# Patient Record
Sex: Male | Born: 1960
Health system: Southern US, Community
[De-identification: ages and names within clinical notes are randomized; demographics above are authoritative.]

## PROBLEM LIST (undated history)

## (undated) DIAGNOSIS — I480 Paroxysmal atrial fibrillation: Secondary | ICD-10-CM

## (undated) DIAGNOSIS — Z9989 Dependence on other enabling machines and devices: Secondary | ICD-10-CM

## (undated) DIAGNOSIS — H9190 Unspecified hearing loss, unspecified ear: Secondary | ICD-10-CM

## (undated) DIAGNOSIS — I428 Other cardiomyopathies: Secondary | ICD-10-CM

## (undated) DIAGNOSIS — E119 Type 2 diabetes mellitus without complications: Secondary | ICD-10-CM

## (undated) DIAGNOSIS — I447 Left bundle-branch block, unspecified: Secondary | ICD-10-CM

## (undated) DIAGNOSIS — Z860100 Personal history of colon polyps, unspecified: Secondary | ICD-10-CM

## (undated) DIAGNOSIS — H269 Unspecified cataract: Secondary | ICD-10-CM

## (undated) DIAGNOSIS — G4733 Obstructive sleep apnea (adult) (pediatric): Secondary | ICD-10-CM

## (undated) DIAGNOSIS — Z95810 Presence of automatic (implantable) cardiac defibrillator: Secondary | ICD-10-CM

## (undated) DIAGNOSIS — K219 Gastro-esophageal reflux disease without esophagitis: Secondary | ICD-10-CM

## (undated) DIAGNOSIS — N200 Calculus of kidney: Secondary | ICD-10-CM

## (undated) DIAGNOSIS — M19049 Primary osteoarthritis, unspecified hand: Secondary | ICD-10-CM

## (undated) DIAGNOSIS — I10 Essential (primary) hypertension: Secondary | ICD-10-CM

## (undated) DIAGNOSIS — M1990 Unspecified osteoarthritis, unspecified site: Secondary | ICD-10-CM

## (undated) DIAGNOSIS — T7840XA Allergy, unspecified, initial encounter: Secondary | ICD-10-CM

## (undated) DIAGNOSIS — F411 Generalized anxiety disorder: Secondary | ICD-10-CM

## (undated) DIAGNOSIS — I5022 Chronic systolic (congestive) heart failure: Secondary | ICD-10-CM

## (undated) DIAGNOSIS — B354 Tinea corporis: Secondary | ICD-10-CM

## (undated) DIAGNOSIS — K7689 Other specified diseases of liver: Secondary | ICD-10-CM

## (undated) HISTORY — DX: Allergy, unspecified, initial encounter: T78.40XA

## (undated) HISTORY — PX: NECK SURGERY: SHX720

## (undated) HISTORY — DX: Unspecified cataract: H26.9

## (undated) HISTORY — DX: Other specified diseases of liver: K76.89

## (undated) HISTORY — DX: Essential (primary) hypertension: I10

## (undated) HISTORY — DX: Unspecified osteoarthritis, unspecified site: M19.90

## (undated) HISTORY — DX: Gastro-esophageal reflux disease without esophagitis: K21.9

## (undated) HISTORY — PX: BILATERAL KNEE ARTHROSCOPY: SUR91

## (undated) HISTORY — DX: Primary osteoarthritis, unspecified hand: M19.049

## (undated) HISTORY — DX: Generalized anxiety disorder: F41.1

## (undated) HISTORY — DX: Tinea corporis: B35.4

## (undated) HISTORY — DX: Obstructive sleep apnea (adult) (pediatric): G47.33

## (undated) HISTORY — DX: Left bundle-branch block, unspecified: I44.7

## (undated) HISTORY — DX: Calculus of kidney: N20.0

## (undated) HISTORY — PX: EYE SURGERY: SHX253

## (undated) HISTORY — DX: Personal history of colonic polyps: Z86.010

## (undated) HISTORY — PX: APPENDECTOMY: SHX54

## (undated) HISTORY — DX: Other cardiomyopathies: I42.8

## (undated) HISTORY — DX: Personal history of colon polyps, unspecified: Z86.0100

## (undated) HISTORY — DX: Unspecified hearing loss, unspecified ear: H91.90

---
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
## 1998-01-04 ENCOUNTER — Emergency Department (HOSPITAL_COMMUNITY): Admission: EM | Admit: 1998-01-04 | Discharge: 1998-01-04 | Payer: Self-pay | Admitting: Emergency Medicine

## 1998-01-04 ENCOUNTER — Encounter: Payer: Self-pay | Admitting: Emergency Medicine

## 2000-09-03 ENCOUNTER — Encounter: Payer: Self-pay | Admitting: Emergency Medicine

## 2000-09-03 ENCOUNTER — Inpatient Hospital Stay (HOSPITAL_COMMUNITY): Admission: EM | Admit: 2000-09-03 | Discharge: 2000-09-04 | Payer: Self-pay | Admitting: Emergency Medicine

## 2000-09-08 ENCOUNTER — Inpatient Hospital Stay (HOSPITAL_COMMUNITY): Admission: EM | Admit: 2000-09-08 | Discharge: 2000-09-08 | Payer: Self-pay | Admitting: Emergency Medicine

## 2000-09-08 ENCOUNTER — Encounter: Payer: Self-pay | Admitting: Emergency Medicine

## 2001-02-22 ENCOUNTER — Encounter: Payer: Self-pay | Admitting: Orthopedic Surgery

## 2001-02-22 ENCOUNTER — Encounter: Admission: RE | Admit: 2001-02-22 | Discharge: 2001-02-22 | Payer: Self-pay | Admitting: Orthopedic Surgery

## 2001-03-24 ENCOUNTER — Ambulatory Visit (HOSPITAL_COMMUNITY): Admission: RE | Admit: 2001-03-24 | Discharge: 2001-03-24 | Payer: Self-pay | Admitting: Neurosurgery

## 2001-03-24 ENCOUNTER — Encounter: Payer: Self-pay | Admitting: Neurosurgery

## 2001-04-11 ENCOUNTER — Encounter: Payer: Self-pay | Admitting: Neurosurgery

## 2001-04-11 ENCOUNTER — Inpatient Hospital Stay (HOSPITAL_COMMUNITY): Admission: RE | Admit: 2001-04-11 | Discharge: 2001-04-13 | Payer: Self-pay | Admitting: Neurosurgery

## 2002-01-18 ENCOUNTER — Emergency Department (HOSPITAL_COMMUNITY): Admission: EM | Admit: 2002-01-18 | Discharge: 2002-01-18 | Payer: Self-pay | Admitting: Emergency Medicine

## 2002-03-28 ENCOUNTER — Emergency Department (HOSPITAL_COMMUNITY): Admission: EM | Admit: 2002-03-28 | Discharge: 2002-03-28 | Payer: Self-pay

## 2003-11-08 ENCOUNTER — Emergency Department (HOSPITAL_COMMUNITY): Admission: EM | Admit: 2003-11-08 | Discharge: 2003-11-08 | Payer: Self-pay | Admitting: Emergency Medicine

## 2004-02-07 ENCOUNTER — Ambulatory Visit: Payer: Self-pay | Admitting: Cardiovascular Disease

## 2004-02-24 ENCOUNTER — Ambulatory Visit: Payer: Self-pay

## 2004-03-06 ENCOUNTER — Ambulatory Visit: Payer: Self-pay | Admitting: Family Medicine

## 2004-06-23 ENCOUNTER — Ambulatory Visit: Payer: Self-pay | Admitting: Family Medicine

## 2004-06-29 ENCOUNTER — Emergency Department (HOSPITAL_COMMUNITY): Admission: EM | Admit: 2004-06-29 | Discharge: 2004-06-30 | Payer: Self-pay | Admitting: Emergency Medicine

## 2004-11-18 ENCOUNTER — Ambulatory Visit: Payer: Self-pay | Admitting: Family Medicine

## 2004-12-11 ENCOUNTER — Ambulatory Visit: Payer: Self-pay | Admitting: Cardiovascular Disease

## 2005-06-17 ENCOUNTER — Ambulatory Visit: Payer: Self-pay | Admitting: Cardiovascular Disease

## 2005-08-25 ENCOUNTER — Ambulatory Visit: Payer: Self-pay | Admitting: Family Medicine

## 2005-08-25 HISTORY — PX: OTHER SURGICAL HISTORY: SHX169

## 2006-04-05 ENCOUNTER — Ambulatory Visit: Payer: Self-pay | Admitting: Family Medicine

## 2006-05-11 ENCOUNTER — Encounter: Payer: Self-pay | Admitting: Internal Medicine

## 2006-05-30 ENCOUNTER — Ambulatory Visit: Payer: Self-pay | Admitting: Cardiovascular Disease

## 2006-07-14 ENCOUNTER — Emergency Department (HOSPITAL_COMMUNITY): Admission: EM | Admit: 2006-07-14 | Discharge: 2006-07-14 | Payer: Self-pay | Admitting: Family Medicine

## 2006-07-19 ENCOUNTER — Ambulatory Visit: Payer: Self-pay | Admitting: Internal Medicine

## 2006-07-19 LAB — CONVERTED CEMR LAB
Glucose, Urine, Semiquant: NEGATIVE
Protein, U semiquant: 30
Specific Gravity, Urine: 1.01
WBC Urine, dipstick: NEGATIVE
pH: 5

## 2006-08-17 ENCOUNTER — Ambulatory Visit: Payer: Self-pay | Admitting: Internal Medicine

## 2006-08-22 ENCOUNTER — Ambulatory Visit: Payer: Self-pay | Admitting: Gastroenterology

## 2006-08-26 HISTORY — PX: UPPER GASTROINTESTINAL ENDOSCOPY: SHX188

## 2006-09-20 ENCOUNTER — Encounter: Payer: Self-pay | Admitting: Internal Medicine

## 2006-09-20 ENCOUNTER — Encounter: Payer: Self-pay | Admitting: Family Medicine

## 2006-09-20 ENCOUNTER — Ambulatory Visit: Payer: Self-pay | Admitting: Internal Medicine

## 2006-09-20 DIAGNOSIS — K219 Gastro-esophageal reflux disease without esophagitis: Secondary | ICD-10-CM | POA: Insufficient documentation

## 2006-09-27 ENCOUNTER — Encounter: Payer: Self-pay | Admitting: Internal Medicine

## 2006-10-04 DIAGNOSIS — L738 Other specified follicular disorders: Secondary | ICD-10-CM | POA: Insufficient documentation

## 2006-10-04 DIAGNOSIS — F411 Generalized anxiety disorder: Secondary | ICD-10-CM | POA: Insufficient documentation

## 2006-10-04 DIAGNOSIS — H9190 Unspecified hearing loss, unspecified ear: Secondary | ICD-10-CM | POA: Insufficient documentation

## 2006-11-25 ENCOUNTER — Ambulatory Visit: Payer: Self-pay | Admitting: Family Medicine

## 2007-01-26 HISTORY — PX: OTHER SURGICAL HISTORY: SHX169

## 2007-02-08 ENCOUNTER — Ambulatory Visit: Payer: Self-pay | Admitting: Family Medicine

## 2007-02-08 DIAGNOSIS — R1011 Right upper quadrant pain: Secondary | ICD-10-CM | POA: Insufficient documentation

## 2007-02-09 ENCOUNTER — Encounter: Admission: RE | Admit: 2007-02-09 | Discharge: 2007-02-09 | Payer: Self-pay | Admitting: Family Medicine

## 2007-02-09 IMAGING — US US ABDOMEN COMPLETE
1 series · 13 of 25 positions shown · non-contrast
Comparison: [DATE]

CLINICAL DATA: 46 year old with right upper quadrant abdominal pain. 
 ABDOMEN ULTRASOUND:
TECHNIQUE: Complete abdominal ultrasound examination was performed including evaluation of the liver, gallbladder, bile ducts, pancreas, kidneys, spleen, IVC, and abdominal aorta.

[Series 1: unknown · 0.46mm/px · 13 of 55 slices shown]
[im 1/55]
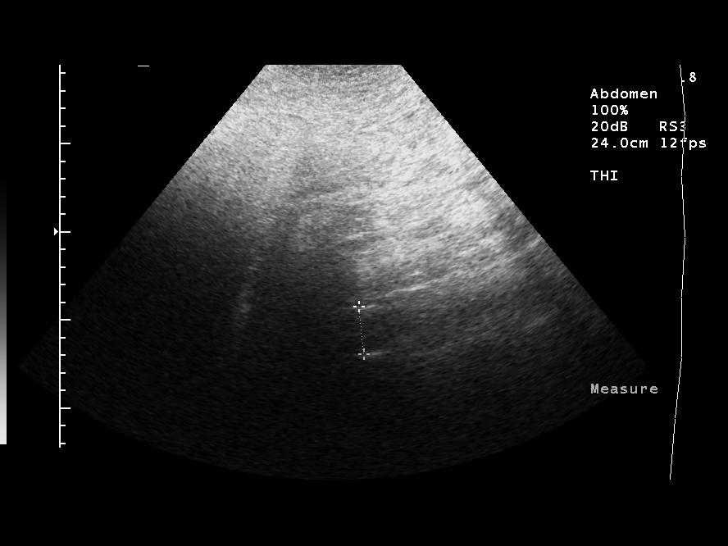
[im 5/55]
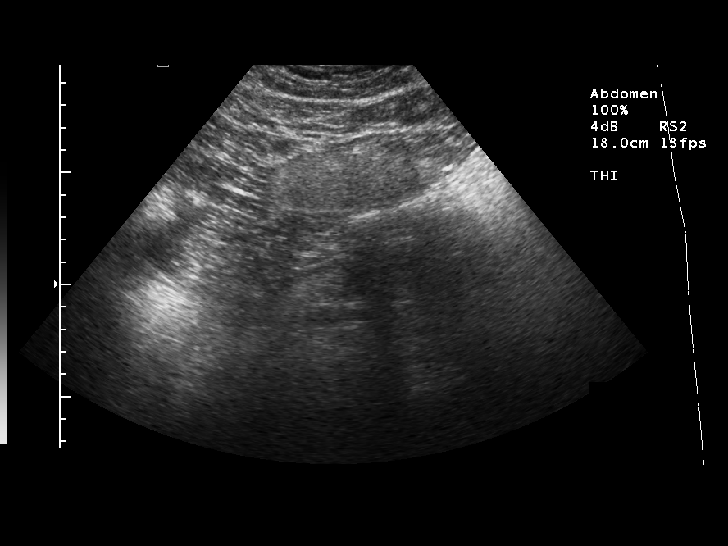
[im 10/55]
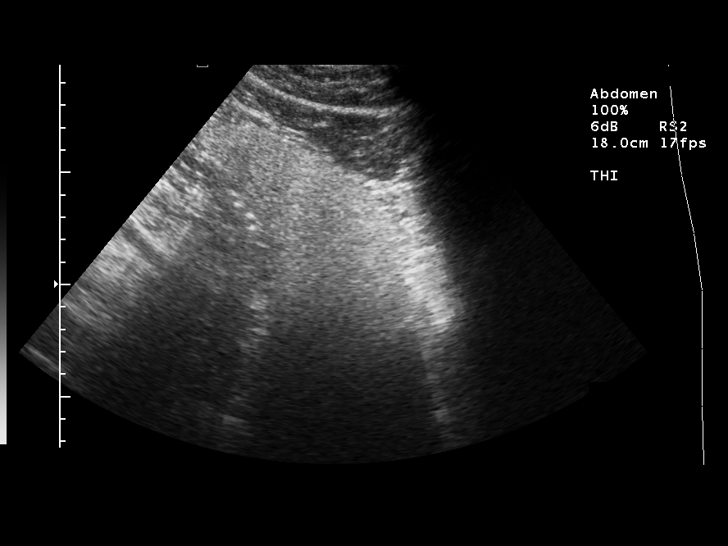
[im 14/55]
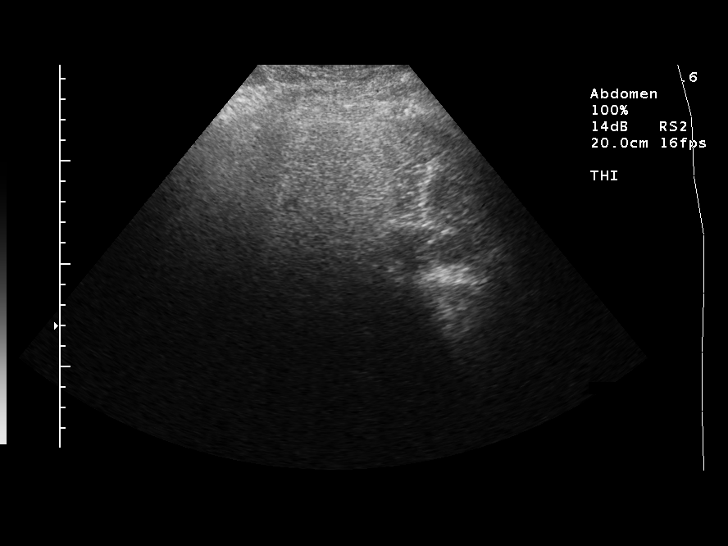
[im 19/55]
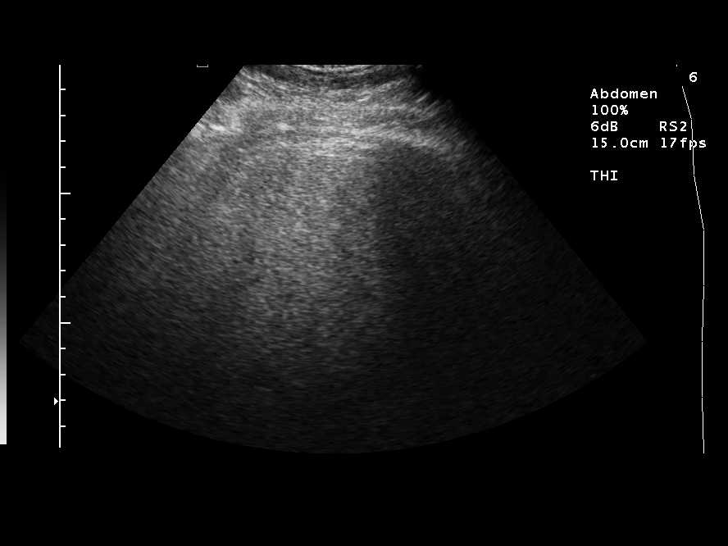
[im 23/55]
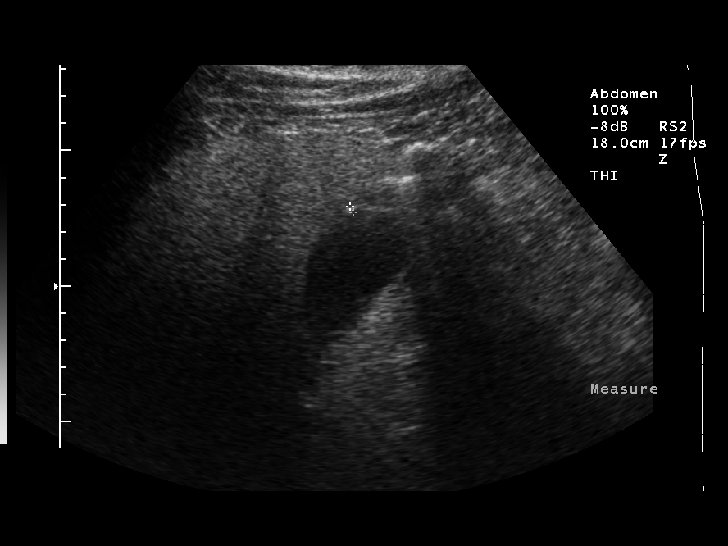
[im 28/55]
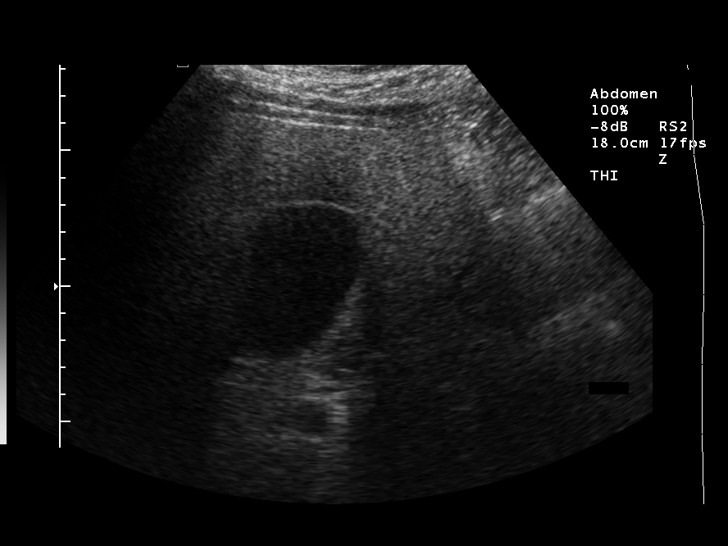
[im 32/55]
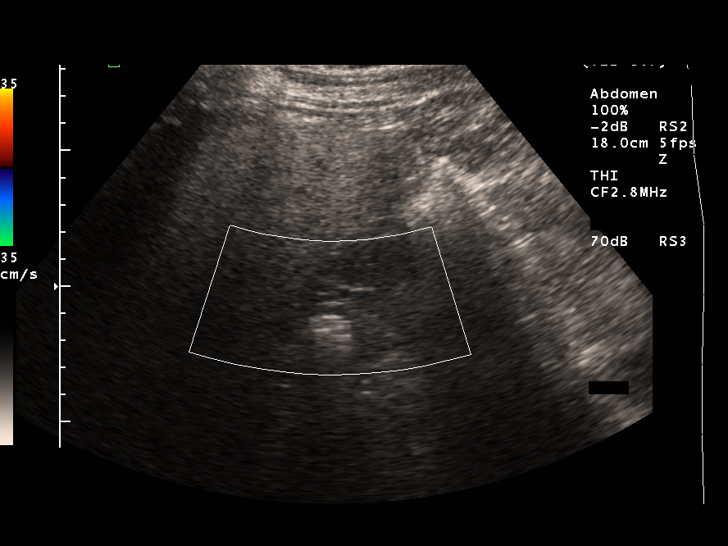
[im 37/55]
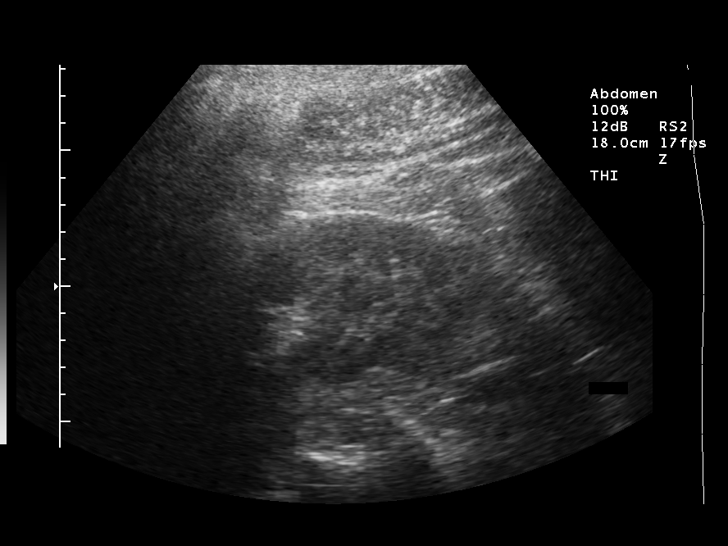
[im 41/55]
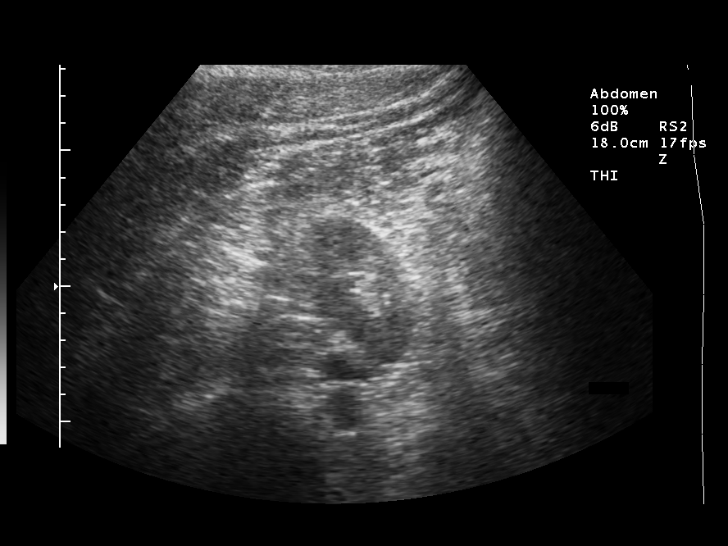
[im 46/55]
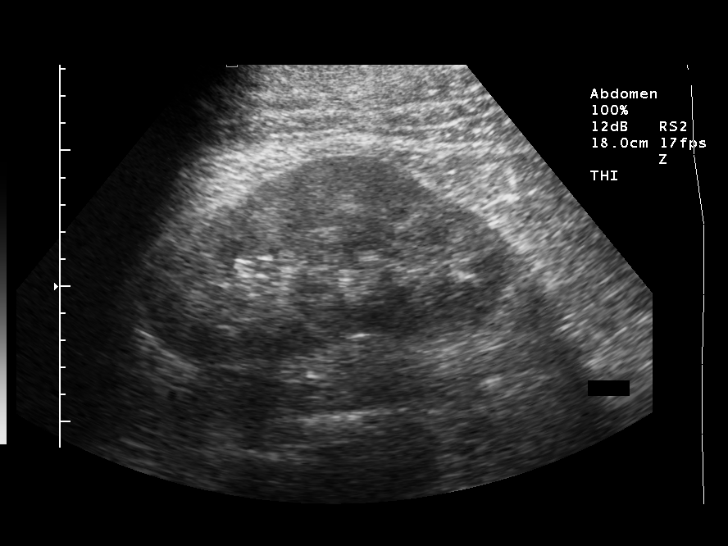
[im 50/55]
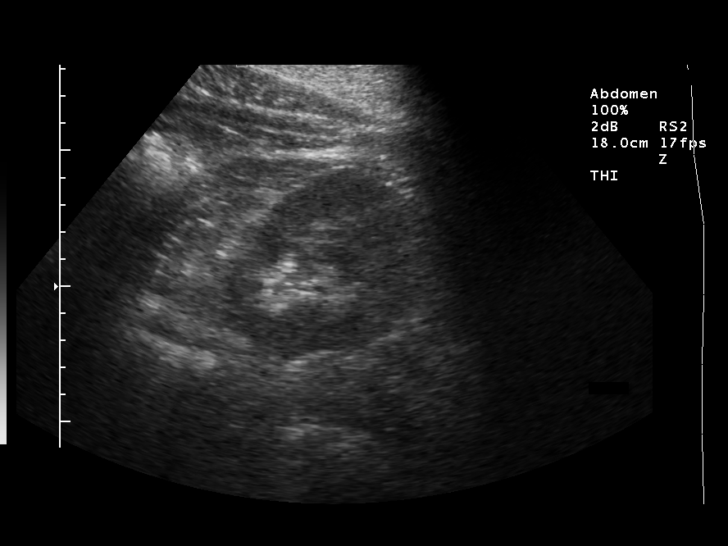
[im 55/55]
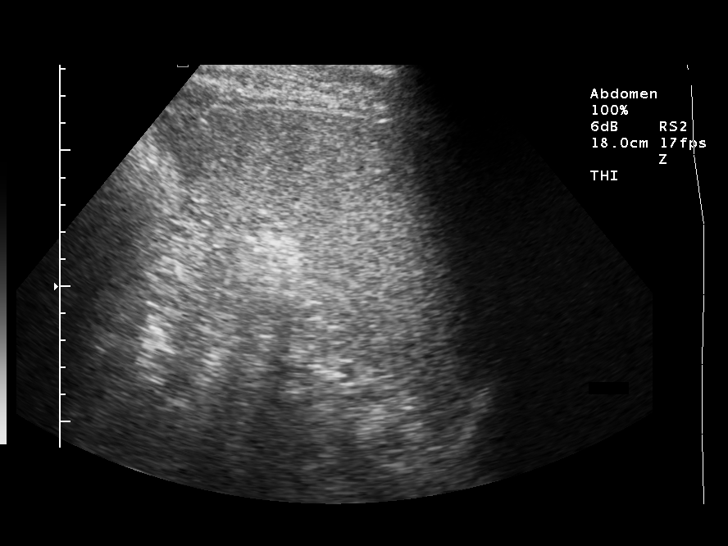

[13 of 25 positions shown; findings below may reference images not displayed]

FINDINGS: There is diffuse increased echogenicity of the liver with poor through transmission and poor definition of the liver architecture all suggesting diffuse fatty infiltration which was also noted on the prior outside ultrasound examination.  No focal hepatic lesion or extrahepatic ductal dilatation. 
 The common bile duct measures 3.6 mm which is within normal limits.  The gallbladder is sonographically normal.
 The pancreas is not well-visualized due to overlying bowel gas.  The IVC is also not well-demonstrated due to overlying bowel gas and poor through transmission from the liver.  The aorta is normal in caliber. 
 The spleen is within normal limits in size and demonstrates normal echogenicity. 
 The right kidney measures 13.5 cm.  The left kidney measures 13.7 cm.  Both kidneys demonstrate normal echogenicity and renal cortical thickness without hydronephrosis.
IMPRESSION: 1.  Diffuse fatty infiltration of the liver.
 2.  Normal appearance of the gallbladder and normal caliber common bile duct.
 3.  Nonvisualization of the pancreas and IVC.

## 2007-02-10 ENCOUNTER — Telehealth (INDEPENDENT_AMBULATORY_CARE_PROVIDER_SITE_OTHER): Payer: Self-pay | Admitting: *Deleted

## 2007-02-13 ENCOUNTER — Ambulatory Visit: Payer: Self-pay | Admitting: Family Medicine

## 2007-02-14 ENCOUNTER — Telehealth: Payer: Self-pay | Admitting: Family Medicine

## 2007-02-15 LAB — CONVERTED CEMR LAB
ALT: 40 units/L (ref 0–53)
AST: 29 units/L (ref 0–37)
Basophils Absolute: 0 10*3/uL (ref 0.0–0.1)
Bilirubin, Direct: 0.1 mg/dL (ref 0.0–0.3)
Eosinophils Absolute: 0.1 10*3/uL (ref 0.0–0.6)
Lymphocytes Relative: 17.9 % (ref 12.0–46.0)
Neutrophils Relative %: 73.5 % (ref 43.0–77.0)
Total Bilirubin: 0.8 mg/dL (ref 0.3–1.2)
Total Protein: 6.6 g/dL (ref 6.0–8.3)
WBC: 8.7 10*3/uL (ref 4.5–10.5)

## 2007-03-20 ENCOUNTER — Ambulatory Visit: Payer: Self-pay | Admitting: Family Medicine

## 2007-03-20 DIAGNOSIS — K760 Fatty (change of) liver, not elsewhere classified: Secondary | ICD-10-CM | POA: Insufficient documentation

## 2007-05-11 DIAGNOSIS — Z87442 Personal history of urinary calculi: Secondary | ICD-10-CM | POA: Insufficient documentation

## 2007-05-11 DIAGNOSIS — I10 Essential (primary) hypertension: Secondary | ICD-10-CM | POA: Insufficient documentation

## 2007-08-23 ENCOUNTER — Telehealth (INDEPENDENT_AMBULATORY_CARE_PROVIDER_SITE_OTHER): Payer: Self-pay | Admitting: *Deleted

## 2007-09-11 ENCOUNTER — Ambulatory Visit: Payer: Self-pay | Admitting: Cardiovascular Disease

## 2007-09-11 LAB — CONVERTED CEMR LAB
AST: 22 units/L (ref 0–37)
Albumin: 4.1 g/dL (ref 3.5–5.2)
Alkaline Phosphatase: 53 units/L (ref 39–117)
Cholesterol: 176 mg/dL (ref 0–200)
LDL Cholesterol: 115 mg/dL — ABNORMAL HIGH (ref 0–99)
Total Bilirubin: 1 mg/dL (ref 0.3–1.2)
Total CHOL/HDL Ratio: 5.7
Total Protein: 6.8 g/dL (ref 6.0–8.3)
Triglycerides: 148 mg/dL (ref 0–149)

## 2008-02-29 ENCOUNTER — Telehealth (INDEPENDENT_AMBULATORY_CARE_PROVIDER_SITE_OTHER): Payer: Self-pay | Admitting: *Deleted

## 2008-05-28 ENCOUNTER — Telehealth (INDEPENDENT_AMBULATORY_CARE_PROVIDER_SITE_OTHER): Payer: Self-pay | Admitting: *Deleted

## 2008-06-03 ENCOUNTER — Ambulatory Visit: Payer: Self-pay | Admitting: Family Medicine

## 2008-06-05 ENCOUNTER — Ambulatory Visit: Payer: Self-pay | Admitting: Family Medicine

## 2008-06-05 ENCOUNTER — Telehealth: Payer: Self-pay | Admitting: Family Medicine

## 2008-06-06 ENCOUNTER — Encounter: Payer: Self-pay | Admitting: Family Medicine

## 2008-06-06 ENCOUNTER — Telehealth: Payer: Self-pay | Admitting: Family Medicine

## 2008-06-07 ENCOUNTER — Encounter: Payer: Self-pay | Admitting: Family Medicine

## 2008-06-10 LAB — CONVERTED CEMR LAB
Eosinophils Absolute: 0.3 10*3/uL (ref 0.0–0.7)
HCT: 46.9 % (ref 39.0–52.0)
Lymphs Abs: 1.5 10*3/uL (ref 0.7–4.0)
MCV: 86.8 fL (ref 78.0–100.0)
Monocytes Absolute: 0.4 10*3/uL (ref 0.1–1.0)
Neutro Abs: 5.3 10*3/uL (ref 1.4–7.7)
Neutrophils Relative %: 71 % (ref 43.0–77.0)
RBC: 5.4 M/uL (ref 4.22–5.81)
WBC: 7.5 10*3/uL (ref 4.5–10.5)

## 2008-08-16 ENCOUNTER — Ambulatory Visit: Payer: Self-pay | Admitting: Family Medicine

## 2008-08-16 DIAGNOSIS — M19049 Primary osteoarthritis, unspecified hand: Secondary | ICD-10-CM | POA: Insufficient documentation

## 2008-08-16 DIAGNOSIS — B354 Tinea corporis: Secondary | ICD-10-CM | POA: Insufficient documentation

## 2008-09-11 ENCOUNTER — Encounter (INDEPENDENT_AMBULATORY_CARE_PROVIDER_SITE_OTHER): Payer: Self-pay | Admitting: *Deleted

## 2008-12-10 ENCOUNTER — Ambulatory Visit: Payer: Self-pay | Admitting: Cardiovascular Disease

## 2009-01-02 ENCOUNTER — Ambulatory Visit: Payer: Self-pay | Admitting: Family Medicine

## 2009-01-27 ENCOUNTER — Telehealth: Payer: Self-pay | Admitting: Family Medicine

## 2009-03-25 ENCOUNTER — Ambulatory Visit: Payer: Self-pay | Admitting: Family Medicine

## 2009-03-25 DIAGNOSIS — J019 Acute sinusitis, unspecified: Secondary | ICD-10-CM | POA: Insufficient documentation

## 2009-04-03 ENCOUNTER — Telehealth: Payer: Self-pay | Admitting: Family Medicine

## 2009-07-02 ENCOUNTER — Inpatient Hospital Stay (HOSPITAL_COMMUNITY): Admission: EM | Admit: 2009-07-02 | Discharge: 2009-07-04 | Payer: Self-pay | Admitting: Emergency Medicine

## 2009-07-02 ENCOUNTER — Ambulatory Visit: Payer: Self-pay | Admitting: Internal Medicine

## 2009-07-03 ENCOUNTER — Encounter: Payer: Self-pay | Admitting: Internal Medicine

## 2009-07-07 ENCOUNTER — Encounter (INDEPENDENT_AMBULATORY_CARE_PROVIDER_SITE_OTHER): Payer: Self-pay | Admitting: *Deleted

## 2009-07-07 ENCOUNTER — Telehealth: Payer: Self-pay | Admitting: Cardiovascular Disease

## 2009-07-09 ENCOUNTER — Telehealth (INDEPENDENT_AMBULATORY_CARE_PROVIDER_SITE_OTHER): Payer: Self-pay | Admitting: *Deleted

## 2009-07-09 ENCOUNTER — Ambulatory Visit: Payer: Self-pay | Admitting: Cardiovascular Disease

## 2009-07-16 ENCOUNTER — Telehealth: Payer: Self-pay | Admitting: Cardiovascular Disease

## 2009-07-16 ENCOUNTER — Encounter: Payer: Self-pay | Admitting: Cardiology

## 2009-07-16 ENCOUNTER — Ambulatory Visit: Payer: Self-pay | Admitting: Cardiovascular Disease

## 2009-07-16 DIAGNOSIS — I425 Other restrictive cardiomyopathy: Secondary | ICD-10-CM | POA: Insufficient documentation

## 2009-07-16 DIAGNOSIS — I43 Cardiomyopathy in diseases classified elsewhere: Secondary | ICD-10-CM

## 2009-07-30 ENCOUNTER — Telehealth: Payer: Self-pay | Admitting: Cardiovascular Disease

## 2009-08-01 ENCOUNTER — Ambulatory Visit: Payer: Self-pay | Admitting: Cardiovascular Disease

## 2009-08-05 LAB — CONVERTED CEMR LAB
Potassium: 3.7 meq/L (ref 3.5–5.1)
Pro B Natriuretic peptide (BNP): 78.8 pg/mL (ref 0.0–100.0)
Sodium: 142 meq/L (ref 135–145)

## 2009-08-25 ENCOUNTER — Ambulatory Visit: Payer: Self-pay | Admitting: Cardiovascular Disease

## 2009-08-25 ENCOUNTER — Encounter (INDEPENDENT_AMBULATORY_CARE_PROVIDER_SITE_OTHER): Payer: Self-pay | Admitting: *Deleted

## 2009-08-28 ENCOUNTER — Telehealth (INDEPENDENT_AMBULATORY_CARE_PROVIDER_SITE_OTHER): Payer: Self-pay | Admitting: *Deleted

## 2009-09-30 ENCOUNTER — Encounter (INDEPENDENT_AMBULATORY_CARE_PROVIDER_SITE_OTHER): Payer: Self-pay | Admitting: *Deleted

## 2009-10-29 ENCOUNTER — Encounter (INDEPENDENT_AMBULATORY_CARE_PROVIDER_SITE_OTHER): Payer: Self-pay | Admitting: *Deleted

## 2009-11-10 ENCOUNTER — Ambulatory Visit (HOSPITAL_COMMUNITY): Admission: RE | Admit: 2009-11-10 | Discharge: 2009-11-10 | Payer: Self-pay | Admitting: Cardiovascular Disease

## 2009-11-25 ENCOUNTER — Encounter (INDEPENDENT_AMBULATORY_CARE_PROVIDER_SITE_OTHER): Payer: Self-pay | Admitting: *Deleted

## 2009-11-25 ENCOUNTER — Ambulatory Visit: Payer: Self-pay | Admitting: Cardiovascular Disease

## 2009-11-25 HISTORY — PX: DOPPLER ECHOCARDIOGRAPHY: SHX263

## 2009-11-28 ENCOUNTER — Telehealth: Payer: Self-pay | Admitting: Cardiology

## 2009-12-02 ENCOUNTER — Telehealth: Payer: Self-pay | Admitting: Cardiovascular Disease

## 2009-12-02 ENCOUNTER — Encounter: Payer: Self-pay | Admitting: Cardiovascular Disease

## 2009-12-12 ENCOUNTER — Ambulatory Visit: Payer: Self-pay

## 2009-12-12 ENCOUNTER — Ambulatory Visit: Payer: Self-pay | Admitting: Cardiovascular Disease

## 2009-12-12 ENCOUNTER — Encounter: Payer: Self-pay | Admitting: Cardiovascular Disease

## 2009-12-12 ENCOUNTER — Ambulatory Visit (HOSPITAL_COMMUNITY): Admission: RE | Admit: 2009-12-12 | Discharge: 2009-12-12 | Payer: Self-pay | Admitting: Cardiovascular Disease

## 2009-12-16 ENCOUNTER — Telehealth: Payer: Self-pay | Admitting: Cardiovascular Disease

## 2010-02-15 ENCOUNTER — Encounter: Payer: Self-pay | Admitting: Cardiovascular Disease

## 2010-02-16 ENCOUNTER — Telehealth: Payer: Self-pay | Admitting: Cardiovascular Disease

## 2010-02-24 NOTE — Assessment & Plan Note (Signed)
Summary: EPH/JML  Medications Added ALTACE 10 MG CAPS (RAMIPRIL) 1 tab by mouth two times a day TOPROL XL 50 MG TB24 (METOPROLOL SUCCINATE) 1 1/2 tab by mouth once daily MUCINEX 600 MG XR12H-TAB (GUAIFENESIN) OTC As directed.prn IBUPROFEN 200 MG TABS (IBUPROFEN) OTC As directed.prn PRADAXA 150 MG CAPS (DABIGATRAN ETEXILATE MESYLATE) 1  tab by mouth two times a day      Allergies Added: NKDA  Primary Provider:  Dr. Milinda Antis  CC:  sob.  History of Present Illness: Aaron Thompson is seen today for F/U post hospital D/C.  Unfortunatley he has developed a severe no nischemic DCM.  I reviewed his hopital notes, cath and echo.  He presented with rapid afib and was cardioverted by Dr Johney Frame.  Post [facility] he had a new IVCD and cath was done.  No CAD but severe LVE with EF 25%.  Compliant with meds on D/C had lovenox and Pradaxa/  No bleeding problems.  Still wearing heart monitor to check for PAF.  No palpitaiotns but ongoing dyspnea.  No syncope or TIA like symptoms  Long discussion with Anay and faimily about natural history of DCM which is likely viral.  Need better heart rate contol and addiion of digoxen and diuretic.  Will limit work hours to 6/day.  F/U labs in 3 weeks and F/U EF evaluation with MRI in 2-3 months.    Current Problems (verified): 1)  Sinusitis - Acute-nos  (ICD-461.9) 2)  Hypertension  (ICD-401.9) 3)  Atrial Fibrillation  (ICD-427.31) 4)  Colonic Polyps  (ICD-211.3) 5)  Osteoarthrosis Unspec Whether Gen/localized Hand  (ICD-715.94) 6)  Tinea Corporis  (ICD-110.5) 7)  Renal Calculus  (ICD-592.0) 8)  Gerd  (ICD-530.81) 9)  Fatty Liver Disease  (ICD-571.8) 10)  Abdominal Pain, Right Upper Quadrant  (ICD-789.01) 11)  Hearing Impairment  (ICD-389.9) 12)  Hx of Folliculitis  (ICD-704.8) 13)  Osteoarthritis  (ICD-715.90) 14)  Anxiety  (ICD-300.00)  Current Medications (verified): 1)  Altace 10 Mg Caps (Ramipril) .[name].. 1 Tab By Mouth Two Times A Day 2)  Toprol Xl 50 Mg Tb24 (Metoprolol  Succinate) .[name].. 1 1/2 Tab By Mouth Once Daily 3)  Multivitamins   Tabs (Multiple Vitamin) .... Take 1 Tablet By Mouth Once A Day 4)  Lotrisone 1-0.05 % Lotn (Clotrimazole-Betamethasone) .... Apply To Affected Area Two Times A Day As Needed 5)  Ventolin Hfa 108 (90 Base) Mcg/act Aers (Albuterol Sulfate) .... 2 Puffs Every 4-6 Hours As Needed For Wheeze 6)  Zegerid Otc 20-1100 Mg Caps (Omeprazole-Sodium Bicarbonate) .... Otc As Directed. 7)  Mucinex 600 Mg Xr12h-Tab (Guaifenesin) .... Otc As Directed.prn 8)  Ibuprofen 200 Mg Tabs (Ibuprofen) .... Otc As Directed.prn 9)  Pradaxa 150 Mg Caps (Dabigatran Etexilate Mesylate) .[name].. 1  Tab By Mouth Two Times A Day  Allergies (verified): No Known Drug Allergies  Past History:  Past Medical History: Last updated: 07/06/2009 Current Problems:  HYPERTENSION (ICD-401.9) ATRIAL FIBRILLATION (ICD-427.31) COLONIC POLYPS (ICD-211.3) OSTEOARTHROSIS UNSPEC WHETHER GEN/LOCALIZED HAND (ICD-715.94) TINEA CORPORIS (ICD-110.5) DIARRHEA (ICD-787.91) GASTROENTERITIS (ICD-558.9) RENAL CALCULUS (ICD-592.0) GERD (ICD-530.81) FATTY LIVER DISEASE (ICD-571.8) ABDOMINAL PAIN, RIGHT UPPER QUADRANT (ICD-789.01) HEARING IMPAIRMENT (ICD-389.9) Hx of FOLLICULITIS (ICD-704.8) OSTEOARTHRITIS (ICD-715.90) ANXIETY (ICD-300.00) non ischemic cardiomyopathy 6/11 afib - with cardioversion 6/11  Past Surgical History: Last updated: 07/06/2009 Stress cardiolite- normal EF 52% (12/02/1998) Stress cardiolite- normal, EF 42% (08/2005) Neck surgery- plate, fusion Knee surgery- bilateral  Stress test- neg per pt (2006) colonoscopy diverticulosis and polyps- 8/08 (next due in 5 y) EGD GERD 8/08 1/09  abd [place] fatty liver (no gallstones) non ischemic cardiomyopathy - hosp 6/11 cath and echo  afib - resolved with cardioversion  6/11  Family History: Last updated: 10/04/2006 Father: HTN Mother: HTN Siblings: 3 brothers, 4 sisters  Social History: Last updated:  11/25/2006 Marital Status: Married Children: 2 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   Vital Signs:  Patient profile:   50 year old male Height:      76 inches Weight:      311 pounds BMI:     37.99 Pulse rate:   94 / minute Resp:     12 per minute BP sitting:   115 / 86  (left arm)  Vitals Entered By: Kem Parkinson (July 16, 2009 1:58 PM)  Physical Exam  General:  Affect appropriate Healthy:  appears stated age HEENT: normal Neck supple with no adenopathy JVP normal no bruits no thyromegaly Lungs clear with no wheezing and good diaphragmatic motion Heart:  S1/S2 no murmur,rub, gallop or click PMI normal Abdomen: benighn, BS positve, no tenderness, no AAA no bruit.  No HSM or HJR Distal pulses intact with no bruits No edema Neuro non-focal Skin warm and dry    Impression & Recommendations:  Problem # 1:  HYPERTENSION (ICD-401.9) Well contolled The following medications were removed from the medication list:    Adult Aspirin Ec Low Strength 81 Mg Tbec (Aspirin) .[name]... Take 1 tablet by mouth once a day His updated medication list for this problem includes:    Altace 10 Mg Caps (Ramipril) .[name]... 1 tab by mouth two times a day    Toprol Xl 50 Mg Tb24 (Metoprolol succinate) .[name]... 1 1/2 tab by mouth once daily  Problem # 2:  ATRIAL FIBRILLATION (ICD-427.31) Maint NSR.  No need for amiodarone unless event monitor shows continued PAF.  Continue Pradaxa for anticoagulation.  He understands that there is no "antidote" for this med if he were to bleed but Williard dose not like needles and does not want to monitor his PT with coumadin The following medications were removed from the medication list:    Adult Aspirin Ec Low Strength 81 Mg Tbec  (Aspirin) .[name]... Take 1 tablet by mouth once a day His updated medication list for this problem includes:    Toprol Xl 50 Mg Tb24 (Metoprolol succinate) .[name]... 1 1/2 tab by mouth once daily  Problem # 3:  CARDIOMYOPATHY OTHER DISEASES CLASSIFIED ELSW (ICD-425.8) Will change BB to coreg and ARB/diuretic.  Not happy with tachycardia and needs improved control.   The following medications were removed from the medication list:    Adult Aspirin Ec Low Strength 81 Mg Tbec (Aspirin) .[name]... Take 1 tablet by mouth once a day His updated medication list for this problem includes:    Altace 10 Mg Caps (Ramipril) .[name]... 1 tab by mouth two times a day    Toprol Xl 50 Mg Tb24 (Metoprolol succinate) .[name]... 1 1/2 tab by mouth once daily  Patient Instructions: 1)  Your physician recommends that you schedule a follow-up appointment in:2- 3 WEEKS 2)  Your physician recommends that you return for lab work in2-3 WEEKS BMET BNP 428.0 401.1 3)  Your physician has recommended you make the following change in your medication: STOP ALTACE AND TOPROL 4)  START 5)  COREG 25  MG two times a day  6)  DIGOXIN 0.25 MG 1 once daily 7)  HYZAAR  100/12.5 MG 1 once daily

## 2010-02-24 NOTE — Progress Notes (Signed)
Summary: Ramipril  Phone Note Refill Request Message from:  Fax from Pharmacy on January 27, 2009 12:11 PM  Refills Requested: Medication #1:  ALTACE 10 MG CAPS Take 1 capsule by mouth once a day [facility]  Phone:   [phone]     This was only refilled once last time.  Is there a reason for that?   Method Requested: Electronic Initial call taken by: Delilah Shan CMA (AAMA),  January 27, 2009 12:11 PM  Follow-up for Phone Call        I don't know, I could have been stingy that day.  Or, it might be because I thought he needed to follow up, I really dont remember.     Lowella Petties CMA  January 27, 2009 12:23 PM  px written on EMR for call in   Follow-up by: Judith Part MD,  January 27, 2009 4:38 PM    Prescriptions: ALTACE 10 MG CAPS (RAMIPRIL) Take 1 capsule by mouth once a day  #30 x 5   Entered by:   Delilah Shan CMA (AAMA)   Authorized by:   Judith Part MD   Signed by:   Delilah Shan CMA (AAMA) on 01/27/2009   Method used:   Electronically to        [facility]* (retail)       [address]       [place], [place]  [address]       Ph: [phone]       Fax: [phone]   RxID:   [id number] ALTACE 10 MG CAPS (RAMIPRIL) Take 1 capsule by mouth once a day  #30 x 5   Entered and Authorized by:   Judith Part MD   Signed by:   Judith Part MD on 01/27/2009   Method used:   Telephoned to ...       [facility]* (retail)       [address]       [place], [place]  [address]       Ph: [phone]       Fax: [phone]   RxID:   [id number]

## 2010-02-24 NOTE — Progress Notes (Signed)
Summary: medical release for pt to return to work    Phone Note Call from Patient Call back at [employer] [phone]   Caller: Spouse  Reason for Call: Talk to Nurse Summary of Call: Per pt wife calling, the city of [place] need a letter of medical release for pt to return to work - 1 shift. even though he taken pradaxa. The nurse is telling pt he is a hazard b/c of this meds.  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 I gave her information from Dr.Nishan that pt may work at [employer] while on pradaxa. Wife asks for this information to be faxed to Dr. Alto Denver at [facility]-- fax [phone]. Will write letter and fax.

## 2010-02-24 NOTE — Letter (Signed)
Summary: Return To Work  [employer], Main Office  [address]   [place], [place] [address]   Phone: [phone]  Fax: [phone]    12/02/2009  TO: Leodis Sias IT MAY CONCERN   RE: [name] [address] [place]   The above named individual is under my medical care and may return to work while taking Pradaxa.   If you have any further questions or need additional information, please call.     Sincerely,  Dossie Arbour, RN,  for Dr. Charlton Haws

## 2010-02-24 NOTE — Progress Notes (Signed)
Summary: pt needs to talk to you today   Phone Note Call from Patient Call back at [phone]   Caller: Patient Reason for Call: Talk to Nurse, Talk to Doctor Summary of Call: pt needs to talk to you today because he needs a letter to go back to work his citty Doc is not letting him go back to work because he is on prodaxa so it is very important to talk to you today Initial call taken by: Omer Jack,  November 28, 2009 2:10 PM  Follow-up for Phone Call        spoke with pt, he is asking how long he may be on pradaxa. he is going to have to stay on second shift per his employer until off the blood thinners. will foward for dr Eden Emms review  Additional Follow-up for Phone Call Additional follow up Details #1::        Saw in office Additional Follow-up by: Colon Branch, MD, [facility],  December 02, 2009 9:35 AM

## 2010-02-24 NOTE — Progress Notes (Signed)
   Walk in Patient Form Recieved " Pt left FMLA papers from [employer] sent to [facility] [employer]  July 09, 2009 11:47 AM

## 2010-02-24 NOTE — Progress Notes (Signed)
Summary: pt request call   Phone Note Call from Patient Call back at [phone]   Reason for Call: Talk to Nurse Initial call taken by: Judie Grieve,  December 16, 2009 10:26 AM  Follow-up for Phone Call        spoke with pt, he was wondering when he maybe able to come off pradaxa. will discuss with dr Verdis Prime, RN  December 16, 2009 2:16 PM  discussed with dr Eden Emms, he would like pt heart functions to be closer to nprmal before stopping pradaxa. pt will have an echo sameday as f/u with Benigna Delisi in feb Deliah Goody, RN  December 16, 2009 5:21 PM

## 2010-02-24 NOTE — Assessment & Plan Note (Signed)
Summary: per check out/sf  Medications Added VALIUM 5 MG TABS (DIAZEPAM) TAKE 30 MIN PRIOR TO PROCEDURE        Primary Provider:  Dr. Milinda Antis  CC:  check up.  History of Present Illness: Aaron Thompson is seen today for F/U post hospital D/C.  Unfortunatley he has developed a severe no nischemic DCM.  I reviewed his hopital notes, cath and echo.  He presented with rapid afib and was cardioverted by Dr Johney Frame.  Post [facility] he had a new IVCD and cath was done.  No CAD but severe LVE with EF 25%.  Compliant with meds on D/C had lovenox and Pradaxa/  No bleeding problems.  Still wearing heart monitor to check for PAF.  No palpitaiotns but ongoing dyspnea.  No syncope or TIA like symptoms  Long discussion with Ever and faimily about natural history of DCM which is likely viral.  Need better heart rate contol and addiion of digoxen and diuretic.  Will limit work hours to 6/day.  F/U labs in 3 weeks and F/U EF evaluation with MRI in 2-3 months.     Current Medications (verified): 1)  Hyzaar 100-12.5 Mg Tabs (Losartan Potassium-Hctz) .[name].. 1 Qd 2)  Carvedilol 25 Mg Tabs (Carvedilol) .[name].. 1 Two Times A Day 3)  Multivitamins   Tabs (Multiple Vitamin) .... Take 1 Tablet By Mouth Once A Day 4)  Lotrisone 1-0.05 % Lotn (Clotrimazole-Betamethasone) .... Apply To Affected Area Two Times A Day As Needed 5)  Ventolin Hfa 108 (90 Base) Mcg/act Aers (Albuterol Sulfate) .... 2 Puffs Every 4-6 Hours As Needed For Wheeze 6)  Zegerid Otc 20-1100 Mg Caps (Omeprazole-Sodium Bicarbonate) .... Otc As Directed. 7)  Mucinex 600 Mg Xr12h-Tab (Guaifenesin) .... Otc As Directed.prn 8)  Pradaxa 150 Mg Caps (Dabigatran Etexilate Mesylate) .[name].. 1  Tab By Mouth Two Times A Day 9)  Digoxin 0.25 Mg/ml Soln (Digoxin) .[name].. 1 Once Daily  Allergies: No Known Drug Allergies  Past History:  Past Medical History: Last updated: 07/06/2009 Current Problems:  HYPERTENSION (ICD-401.9) ATRIAL FIBRILLATION (ICD-427.31) COLONIC POLYPS  (ICD-211.3) OSTEOARTHROSIS UNSPEC WHETHER GEN/LOCALIZED HAND (ICD-715.94) TINEA CORPORIS (ICD-110.5) DIARRHEA (ICD-787.91) GASTROENTERITIS (ICD-558.9) RENAL CALCULUS (ICD-592.0) GERD (ICD-530.81) FATTY LIVER DISEASE (ICD-571.8) ABDOMINAL PAIN, RIGHT UPPER QUADRANT (ICD-789.01) HEARING IMPAIRMENT (ICD-389.9) Hx of FOLLICULITIS (ICD-704.8) OSTEOARTHRITIS (ICD-715.90) ANXIETY (ICD-300.00) non ischemic cardiomyopathy 6/11 afib - with cardioversion 6/11  Past Surgical History: Last updated: 07/06/2009 Stress cardiolite- normal EF 52% (12/02/1998) Stress cardiolite- normal, EF 42% (08/2005) Neck surgery- plate, fusion Knee surgery- bilateral  Stress test- neg per pt (2006) colonoscopy diverticulosis and polyps- 8/08 (next due in 5 y) EGD GERD 8/08 1/09 abd [place] fatty liver (no gallstones) non ischemic cardiomyopathy - hosp 6/11 cath and echo  afib - resolved with cardioversion  6/11  Family History: Last updated: 10/04/2006 Father: HTN Mother: HTN Siblings: 3 brothers, 4 sisters  Social History: Last updated: 11/25/2006 Marital Status: Married Children: 2 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   Vital Signs:  Patient profile:   50 year old male Height:      76 inches Weight:      302 pounds BMI:     36.89 Pulse rate:   75 / minute Resp:     12 per minute BP sitting:   112 / 77  (left  arm)  Vitals Entered By: Kem Parkinson (August 25, 2009 11:55 AM)  Physical Exam  General:  Affect appropriate Healthy:  appears stated age HEENT: normal Neck supple with no adenopathy JVP normal no bruits no thyromegaly Lungs clear with no wheezing and good diaphragmatic motion Heart:  S1/S2 no murmur,rub, gallop or click PMI normal Abdomen:  benighn, BS positve, no tenderness, no AAA no bruit.  No HSM or HJR Distal pulses intact with no bruits No edema Neuro non-focal Skin warm and dry    Impression & Recommendations:  Problem # 1:  CARDIOMYOPATHY OTHER DISEASES CLASSIFIED ELSW (ICD-425.8) F/U MRI 8 weeks.  No CAD on cath His updated medication list for this problem includes:    Hyzaar 100-12.5 Mg Tabs (Losartan potassium-hctz) .[name]... 1 qd    Carvedilol 25 Mg Tabs (Carvedilol) .[name]... 1 two times a day    Digoxin 0.25 Mg/ml Soln (Digoxin) .[name]... 1 once daily  Orders: Cardiac MRI (Cardiac MRI)  Problem # 2:  HYPERTENSION (ICD-401.9) Improved continue ARB His updated medication list for this problem includes:    Hyzaar 100-12.5 Mg Tabs (Losartan potassium-hctz) .[name]... 1 qd    Carvedilol 25 Mg Tabs (Carvedilol) .[name]... 1 two times a day  Problem # 3:  ATRIAL FIBRILLATION (ICD-427.31) Maint NSR S/O DCC  Continue Pradaxa until at least January 2012 His updated medication list for this problem includes:    Carvedilol 25 Mg Tabs (Carvedilol) .[name]... 1 two times a day    Digoxin 0.25 Mg/ml Soln (Digoxin) .[name]... 1 once daily  Patient Instructions: 1)  Your physician recommends that you schedule a follow-up appointment in: 3 MONTHS 2)  Your physician has requested that you have a cardiac MRI.  Cardiac MRI uses a computer to create images of your heart as it's beating, producing both still and moving pictures of your heart and major blood vessels. For further information please visit  [email or web address]  Please follow the instruction sheet given to you today for more information. Prescriptions: VALIUM 5 MG TABS (DIAZEPAM) TAKE 30 MIN PRIOR TO PROCEDURE  #2 x 0   Entered by:   Deliah Goody, RN   Authorized by:   Colon Branch, MD, [facility]   Signed by:   Deliah Goody, RN on 08/25/2009   Method used:   Print then Give to Patient   RxID:   [id number]

## 2010-02-24 NOTE — Progress Notes (Signed)
Summary: work note   Phone Note Call from Patient Call back at [employer] [phone]   Caller: Patient Reason for Call: Talk to Nurse Summary of Call: needs note stating he can stay out of work till June 22nd, please fax to his job [employer] @ [phone], please call pt once sent Initial call taken by: Migdalia Dk,  July 07, 2009 8:38 AM  Follow-up for Phone Call        note generated and sent to provided fax, pt aware Deliah Goody, RN  July 07, 2009 12:31 PM

## 2010-02-24 NOTE — Progress Notes (Signed)
   Phone Note Outgoing Call   Call placed by: Deliah Goody, RN,  August 28, 2009 5:54 PM Summary of Call: pt aware monitor, per dr Eden Emms review, shows sinus with LBBB and occ PVC's Initial call taken by: Deliah Goody, RN,  August 28, 2009 5:56 PM

## 2010-02-24 NOTE — Letter (Signed)
Summary: Return To Work  [employer], Main Office  [address]   [place], [place] [address]   Phone: [phone]  Fax: [phone]    07/07/2009  TO: Leodis Sias IT MAY CONCERN   RE: KRISTIN LAMAGNA [address] [place]   The above named individual is under my medical care and may not return to work until seen on June 22nd.  If you have any further questions or need additional information, please call.     Sincerely, Deliah Goody, RN/Dr Charlton Haws

## 2010-02-24 NOTE — Assessment & Plan Note (Signed)
Summary: FLU LIKE SYMT  CYD   Vital Signs:  Patient profile:   50 year old male Height:      76 inches Weight:      316.50 pounds BMI:     38.66 Temp:     97.9 degrees F oral Pulse rate:   84 / minute Pulse rhythm:   regular BP sitting:   134 / 84  (left arm) Cuff size:   large  Vitals Entered By: Lewanda Rife LPN (March 26, 8411 12:17 PM)  History of Present Illness: started getting sick 4 days ago  cough and congestion in nose and headache / frontal  took tylenol  may have had low grade fever- mild chills  working through it - so not the flu   blowing out yellow with blood from nose - sinuses are irritated  prod yellow d/c as well  no wheezing  cough is orse in am and not sleeping great  started taking mucinex few days ago  is drinking lots of fluids   some sinus pain and fluid in ears- cannot hear well     Allergies (verified): No Known Drug Allergies  Past History:  Past Medical History: Last updated: 12/09/2008 Current Problems:  HYPERTENSION (ICD-401.9) ATRIAL FIBRILLATION (ICD-427.31) COLONIC POLYPS (ICD-211.3) OSTEOARTHROSIS UNSPEC WHETHER GEN/LOCALIZED HAND (ICD-715.94) TINEA CORPORIS (ICD-110.5) DIARRHEA (ICD-787.91) GASTROENTERITIS (ICD-558.9) RENAL CALCULUS (ICD-592.0) GERD (ICD-530.81) FATTY LIVER DISEASE (ICD-571.8) ABDOMINAL PAIN, RIGHT UPPER QUADRANT (ICD-789.01) HEARING IMPAIRMENT (ICD-389.9) Hx of FOLLICULITIS (ICD-704.8) OSTEOARTHRITIS (ICD-715.90) ANXIETY (ICD-300.00)  Past Surgical History: Last updated: 02/11/2007 Stress cardiolite- normal EF 52% (12/02/1998) Stress cardiolite- normal, EF 42% (08/2005) Neck surgery- plate, fusion Knee surgery- bilateral  Stress test- neg per pt (2006) colonoscopy diverticulosis and polyps- 8/08 (next due in 5 y) EGD GERD 8/08 1/09 abd [place] fatty liver (no gallstones)  Family History: Last updated: 10/04/2006 Father: HTN Mother: HTN Siblings: 3 brothers, 4 sisters  Social History: Last  updated: 11/25/2006 Marital Status: Married Children: 2 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 Eyes:  Denies discharge and eye irritation. ENT:  Complains of decreased hearing, earache, nasal congestion, postnasal drainage, sinus pressure, and sore throat. CV:  Denies chest pain or discomfort and palpitations. Resp:  Complains of cough and sputum productive; denies pleuritic, shortness of breath, and wheezing. GI:  Denies diarrhea, nausea, and vomiting. Derm:  Denies rash.  Physical Exam  General:  overweight but generally well appearing  Head:  tender frontal and maxillary sinuses -marked  normocephalic, atraumatic, and no abnormalities observed.   Eyes:  vision grossly intact, pupils equal, pupils round, pupils reactive to light, and no injection.   Ears:  TMs are dull bilat  scant cerumen Nose:  nares are injected and congested bilaterally very nasal sounding voice  Mouth:  post nasal drip noted no erythema or lesions Neck:  No deformities, masses, or tenderness noted. Lungs:  CTA with harsh bs at bases no rales or rhonchi Heart:  Normal rate and regular rhythm. S1 and S2 normal without gallop, murmur, click, rub or other extra sounds. Skin:  Intact without suspicious lesions or rashes Cervical Nodes:  No lymphadenopathy noted Psych:  normal affect, talkative and pleasant    Impression & Recommendations:  Problem # 1:  SINUSITIS - ACUTE-NOS (ICD-461.9) Assessment New  with facial pain and purulent nasal d/c in pt prone to sinusitis will cover with augmentin and update  recommend sympt care- see pt instructions  -- disc pot benefit of sinus saline irrigation as well pt advised to update me if symptoms worsen or do not improve  His updated medication list for this problem includes:    Mucinex 600 Mg Xr12h-tab (Guaifenesin) ..... Otc  as directed.    Augmentin 875-125 Mg Tabs (Amoxicillin-pot clavulanate) .[name]... 1 by mouth two times a day for 10 days  Orders: Prescription Created Electronically [phone])  Complete Medication List: 1)  Altace 10 Mg Caps (Ramipril) .... Take 1 capsule by mouth once a day 2)  Toprol Xl 50 Mg Tb24 (Metoprolol succinate) .[name].. 1 tab by mouth once daily 3)  Multivitamins Tabs (Multiple vitamin) .... Take 1 tablet by mouth once a day 4)  Adult Aspirin Ec Low Strength 81 Mg Tbec (Aspirin) .... Take 1 tablet by mouth once a day 5)  Promethazine Hcl 25 Mg Tabs (Promethazine hcl) .[name].. 1 by mouth up to three times a day as needed nausea 6)  Lotrisone 1-0.05 % Lotn (Clotrimazole-betamethasone) .... Apply to affected area two times a day as needed 7)  Ventolin Hfa 108 (90 Base) Mcg/act Aers (Albuterol sulfate) .... 2 puffs every 4-6 hours as needed for wheeze 8)  Zegerid Otc 20-1100 Mg Caps (Omeprazole-sodium bicarbonate) .... Otc as directed. 9)  Mucinex 600 Mg Xr12h-tab (Guaifenesin) .... Otc as directed. 10)  Ibuprofen 200 Mg Tabs (Ibuprofen) .... Otc as directed. 11)  Augmentin 875-125 Mg Tabs (Amoxicillin-pot clavulanate) .[name].. 1 by mouth two times a day for 10 days  Patient Instructions: 1)  you can try mucinex over the counter twice daily as directed and nasal saline spray for congestion (or netti pot)  2)  tylenol over the counter as directed may help with aches, headache and fever 3)  call if symptoms worsen or if not improved in 4-5 days  4)  take your augmentin as directed for sinus infection (I sent that to [facility]) Prescriptions: TOPROL XL 50 MG TB24 (METOPROLOL SUCCINATE) 1 tab by mouth once daily  #30 x 11   Entered and Authorized by:   Judith Part MD   Signed by:   Judith Part MD on 03/25/2009   Method used:   Electronically to        [facility]* (retail)       [address]       [place], [place]  [address]       Ph: [phone]       Fax: [phone]   RxID:    [id number] AUGMENTIN 875-125 MG TABS (AMOXICILLIN-POT CLAVULANATE) 1 by mouth two times a day for 10 days  #20 x 0   Entered and Authorized by:   Judith Part MD   Signed by:   Judith Part MD on 03/25/2009   Method used:   Electronically to        [facility]* (retail)       [address]       [place], [place]  [address]       Ph: [phone]       Fax: [phone]   RxID:   [id number]   Current Allergies (reviewed today): No known allergies

## 2010-02-24 NOTE — Letter (Signed)
Summary: Return To Work  [facility], Main Office  [address]   [place], [place] [address]   Phone: [phone]  Fax: [phone]    11/25/2009  TO: WHOM IT MAY CONCERN   RE: STEPHENSON CICHY [address] [place]   The above named individual is under my medical care and may return to work [id number] 11/25/09 FOR 8 HOURS ONLY  If you have any further questions or need additional information, please call.     Sincerely, Deliah Goody, RN/Dr Charlton Haws

## 2010-02-24 NOTE — Assessment & Plan Note (Signed)
Summary: F3M/DM      Allergies Added: NKDA  Visit Type:  Follow-up Primary Provider:  Dr. Milinda Antis  CC:  no cardiology complaints.  History of Present Illness: Aaron Thompson is seen today for F/U post hospital D/C.  Unfortunatley he has developed a severe no nonischemic DCM.  I reviewed his hopital notes, cath and echo.  He presented with rapid afib and was cardioverted by Dr Johney Frame.  Post [facility] he had a new IVCD and cath was done.  No CAD but severe LVE with EF 25%.  Compliant with meds on D/C had lovenox and Pradaxa/  No bleeding problems.  Still wearing heart monitor to check for PAF.  No palpitaiotns but ongoing dyspnea.  No syncope or TIA like symptoms  He feels good and needs a F/U echo for EF.  He got claustrophobic and could not do the MRI.  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) 2)  Sinusitis - Acute-nos  (ICD-461.9) 3)  Hypertension  (ICD-401.9) 4)  Atrial Fibrillation  (ICD-427.31) 5)  Colonic Polyps  (ICD-211.3) 6)  Osteoarthrosis Unspec Whether Gen/localized Hand  (ICD-715.94) 7)  Tinea Corporis  (ICD-110.5) 8)  Renal Calculus  (ICD-592.0) 9)  Gerd  (ICD-530.81) 10)  Fatty Liver Disease  (ICD-571.8) 11)  Abdominal Pain, Right Upper Quadrant  (ICD-789.01) 12)  Hearing Impairment  (ICD-389.9) 13)  Hx of Folliculitis  (ICD-704.8) 14)  Osteoarthritis  (ICD-715.90) 15)  Anxiety  (ICD-300.00)  Current Medications (verified): 1)  Hyzaar 100-12.5 Mg Tabs (Losartan Potassium-Hctz) .[name].. 1 Qd 2)  Carvedilol 25 Mg Tabs (Carvedilol) .[name].. 1 Two Times A Day 3)  Multivitamins   Tabs (Multiple Vitamin) .... Take 1 Tablet By Mouth Once A Day 4)  Lotrisone 1-0.05 % Lotn (Clotrimazole-Betamethasone) .... Apply To Affected Area Two Times A Day As Needed 5)  Ventolin Hfa 108 (90 Base) Mcg/act Aers (Albuterol Sulfate) .... 2 Puffs Every 4-6 Hours As Needed For Wheeze 6)  Zegerid Otc 20-1100 Mg Caps (Omeprazole-Sodium Bicarbonate) .... Otc As  Directed. 7)  Mucinex 600 Mg Xr12h-Tab (Guaifenesin) .... Otc As Directed.prn 8)  Pradaxa 150 Mg Caps (Dabigatran Etexilate Mesylate) .[name].. 1  Tab By Mouth Two Times A Day 9)  Digoxin 0.25 Mg/ml Soln (Digoxin) .[name].. 1 Once Daily 10)  Valium 5 Mg Tabs (Diazepam) .... Take 30 Min Prior To Procedure  Allergies (verified): No Known Drug Allergies  Comments:  Nurse/Medical Assistant: reviewed previous med list with patient no meds no bottles stated meds are correct  Past History:  Past Medical History: Last updated: 07/06/2009 Current Problems:  HYPERTENSION (ICD-401.9) ATRIAL FIBRILLATION (ICD-427.31) COLONIC POLYPS (ICD-211.3) OSTEOARTHROSIS UNSPEC WHETHER GEN/LOCALIZED HAND (ICD-715.94) TINEA CORPORIS (ICD-110.5) DIARRHEA (ICD-787.91) GASTROENTERITIS (ICD-558.9) RENAL CALCULUS (ICD-592.0) GERD (ICD-530.81) FATTY LIVER DISEASE (ICD-571.8) ABDOMINAL PAIN, RIGHT UPPER QUADRANT (ICD-789.01) HEARING IMPAIRMENT (ICD-389.9) Hx of FOLLICULITIS (ICD-704.8) OSTEOARTHRITIS (ICD-715.90) ANXIETY (ICD-300.00) non ischemic cardiomyopathy 6/11 afib - with cardioversion 6/11  Past Surgical History: Last updated: 07/06/2009 Stress cardiolite- normal EF 52% (12/02/1998) Stress cardiolite- normal, EF 42% (08/2005) Neck surgery- plate, fusion Knee surgery- bilateral  Stress test- neg per pt (2006) colonoscopy diverticulosis and polyps- 8/08 (next due in 5 y) EGD GERD 8/08 1/09 abd [place] fatty liver (no gallstones) non ischemic cardiomyopathy - hosp 6/11 cath and echo  afib - resolved with cardioversion  6/11  Family History: Last updated: 10/04/2006 Father: HTN Mother: HTN Siblings: 3 brothers, 4 sisters  Social History: Last updated: 11/25/2006 Marital Status: Married Children: 2 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   Vital Signs:  Patient profile:   50 year old male Weight:      300 pounds Pulse rate:   72 / minute BP sitting:   128 / 88  (right arm)  Vitals Entered By: Dreama Saa, CNA (November 25, 2009 9:14 AM)  Physical Exam  General:  Affect appropriate Healthy:  appears stated age HEENT: normal Neck supple with no adenopathy JVP normal no bruits no thyromegaly Lungs clear with no wheezing and good diaphragmatic motion Heart:  S1/S2 no murmur,rub, gallop or click PMI normal Abdomen: benighn, BS positve, no tenderness, no AAA no bruit.  No HSM or HJR Distal pulses intact with no bruits No edema Neuro non-focal Skin warm and dry    Impression & Recommendations:  Problem # 1:  CARDIOMYOPATHY OTHER DISEASES CLASSIFIED ELSW (ICD-425.8) F/U echo  funcitonal class 1-2.   His updated medication list for this problem includes:    Hyzaar 100-12.5 Mg Tabs (Losartan potassium-hctz) .[name]... 1 qd    Carvedilol 25 Mg Tabs (Carvedilol) .[name]... 1 two times a day    Digoxin 0.25 Mg/ml Soln (Digoxin) .[name]... 1 once daily  Orders: Echocardiogram (Echo)  Problem # 2:  HYPERTENSION (ICD-401.9) Well controlled His updated medication list for this problem includes:    Hyzaar 100-12.5 Mg Tabs (Losartan potassium-hctz) .[name]... 1 qd    Carvedilol 25 Mg Tabs (Carvedilol) .[name]... 1 two times a day  Problem # 3:  ATRIAL FIBRILLATION (ICD-427.31) Maint NSR S/O DCC.  Hopefully EF will improve with maint NSR and lower rates His updated medication list for this problem includes:    Carvedilol 25 Mg Tabs (Carvedilol) .[name]... 1 two times a day    Digoxin 0.25 Mg/ml Soln (Digoxin) .[name]... 1 once daily  Patient Instructions: 1)  Your physician recommends that you schedule a follow-up appointment in: 3 MONTHS 2)  Your physician has requested that you have an echocardiogram.  Echocardiography is a painless test that uses sound waves to  create images of your heart. It provides your doctor with information about the size and shape of your heart and how well your heart's chambers and valves are working.  This procedure takes approximately one hour. There are no restrictions for this procedure.

## 2010-02-24 NOTE — Letter (Signed)
Summary: Appointment - Cardiac MRI  [facility]  [address]   [place], [place] [address]   Phone: [phone]  Fax: [phone]      September 30, 2009 MRN: [id number]   Aaron Thompson [address] [place], [place]  [address]   Dear Mr. YEATTS,   We have scheduled the above patient for an appointment for a Cardiac MRI on 10-06-2009 at 1:00 p.m.  Please refer to the below information for the location and instructions for this test:  Location:     [facility]       [address]       [place], [place]  [address] Instructions:    Wilmon Arms at [facility] Outpatient Registration 45 minutes prior to your appointment time.  This will ensure you are in the Radiology Department 30 minutes prior to your appointment.    There are no restrictions for this test you may eat and take medications as usual.  If you need to reschedule this appointment please call at the number listed above.  Sincerely,      Lorne Skeens  [facility] Scheduling Team

## 2010-02-24 NOTE — Procedures (Signed)
Summary: Pt Activity Report  Pt Activity Report   Imported By: Erle Crocker 09/02/2009 14:01:22  _____________________________________________________________________  External Attachment:    Type:   Image     Comment:   External Document

## 2010-02-24 NOTE — Letter (Signed)
Summary: Appointment - Cardiac MRI  [facility], Main Office  [address]   [place], [place] [address]   Phone: [phone]  Fax: [phone]      October 29, 2009 MRN: [id number]   Aaron Thompson [address] [place], [place]  [address]   Dear Mr. ACKLIN,   We have scheduled the above patient for an appointment for a Cardiac MRI on 11-10-2009 at  3:00 p.m.  Please refer to the below information for the location and instructions for this test:  Location:     [facility]       [address]       [place], [place]  [address] Instructions:    Wilmon Arms at [facility] Outpatient Registration 45 minutes prior to your appointment time.  This will ensure you are in the Radiology Department 30 minutes prior to your appointment.    There are no restrictions for this test you may eat and take medications as usual.  If you need to reschedule this appointment please call at the number listed above.  Sincerely,       Lorne Skeens  [facility] Scheduling Team

## 2010-02-24 NOTE — Progress Notes (Signed)
Summary: LOTRISONE 1-0.05 % LOTN (CLOTRIMAZOLE-BETAMETHASONE)  Phone Note Call from Patient Call back at Home Phone [phone]   Caller: Spouse Call For: Aaron Part MD Summary of Call: Wife says that patient has rash that he had back in july of last year. She wants to know if he can have some of the LOTRISONE 1-0.05 % LOTN (CLOTRIMAZOLE-BETAMETHASONE) called in to [facility].  Initial call taken by: Melody Comas,  April 03, 2009 3:57 PM  Follow-up for Phone Call        can refil that px written on EMR for call in  f/u if no improvement  Follow-up by: Aaron Part MD,  April 03, 2009 7:48 PM  Additional Follow-up for Phone Call Additional follow up Details #1::        Med called to [facility], lmom advising pt. Additional Follow-up by: Lowella Petties CMA,  April 04, 2009 9:14 AM    New/Updated Medications: LOTRISONE 1-0.05 % LOTN (CLOTRIMAZOLE-BETAMETHASONE) apply to affected area two times a day as needed Prescriptions: LOTRISONE 1-0.05 % LOTN (CLOTRIMAZOLE-BETAMETHASONE) apply to affected area two times a day as needed  #1 medium x 1   Entered and Authorized by:   Aaron Part MD   Signed by:   Lowella Petties CMA on 04/04/2009   Method used:   Telephoned to ...       [facility]* (retail)       [address]       [place], [place]  [address]       Ph: [phone]       Fax: [phone]   RxID:   [id number]

## 2010-02-24 NOTE — Letter (Signed)
Summary: Return To Work  [facility], Main Office  [address]   [place], [place] [address]   Phone: [phone]  Fax: [phone]    07/16/2009  TO: Leodis Sias IT MAY CONCERN   RE: Aaron Thompson [address] [place]   The above named individual is under my medical care and may return to work AND ONLY WORK 6 HOURS PER DAY  If you have any further questions or need additional information, please call.     Sincerely, DR PETER NISHAN/ Scherrie Bateman, LPN

## 2010-02-24 NOTE — Letter (Signed)
Summary: Return To Work  [facility], Main Office  [address]   [place], [place] [address]   Phone: [phone]  Fax: [phone]    08/25/2009  TO: WHOM IT MAY CONCERN   RE: BRYLER DIBBLE Bare [address] [place]   The above named individual is under my medical care and may return to work [id number] CAN ONLY WORK 6 HOURS DAILY UNTIL FOLLOW UP APPT IN [place].  If you have any further questions or need additional information, please call.     Sincerely, Dr Charlton Haws   Deliah Goody, RN

## 2010-02-24 NOTE — Progress Notes (Signed)
Summary: meds   Phone Note Call from Patient Call back at Home Phone [phone]   Caller: Spouse Reason for Call: Talk to Nurse Summary of Call: spouse calling stating pt was just seen and his new meds were not called in..... [facility] [phone], they close @ 6 Initial call taken by: Migdalia Dk,  July 16, 2009 4:50 PM  Follow-up for Phone Call        spoke w/pts wife will send meds into pharmacy through pts ov note Meredith Staggers, RN  July 16, 2009 4:55 PM

## 2010-02-24 NOTE — Progress Notes (Signed)
Summary: pradaxa or asa 325 mg   Phone Note Call from Patient Call back at Home Phone [phone]   Refills Requested: Medication #1:  150 MG CAPS 1  tab by mouth two times a day Caller: Spouse Reason for Call: Talk to Nurse Details for Reason: per pt wife called, pt on PRADAXA. will he be on this or not. or go back to asa 325. mg. Initial call taken by: Lorne Skeens,  July 30, 2009 3:46 PM  Follow-up for Phone Call        spoke with pt wife, he has an appt for follow up in the next several weeks and the decision will be made about cont praxada or not. the pt does not have enough praxada to last to his appt. samples placed at the front desk for pt pick up Deliah Goody, RN  July 30, 2009 5:04 PM

## 2010-02-26 ENCOUNTER — Telehealth (INDEPENDENT_AMBULATORY_CARE_PROVIDER_SITE_OTHER): Payer: Self-pay | Admitting: *Deleted

## 2010-02-26 NOTE — Progress Notes (Signed)
Summary: RX SAMPLES   Phone Note Call from Patient Call back at Home Phone [phone]   Caller: Spouse/SHELIA Reason for Call: Talk to Nurse Summary of Call: PT WIFE CALLING RE SAMPLES OF PRADAXA 150 MG. PT WIFE WOULD LIKE TO KNOW IF SHE CAN PICK UP SOME SAMPLES. Initial call taken by: Roe Coombs,  February 16, 2010 4:48 PM  Follow-up for Phone Call        Patient's wife states they need Pradaxa samples. He can't be released to work b/c of his job and the risk of bleeding. 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  The [facility] doctor,  Dr. Alto Denver, will not clear him to work. Since the pt. will see Dr. Eden Emms next week 02/27/10, the wife would like samples just in case he decides to take him off of Pradaxa. I will leave her samples at the front.  Whitney Maeola Sarah RN  February 16, 2010 5:00 PM  Follow-up by: Whitney Maeola Sarah RN,  February 16, 2010 5:01 PM

## 2010-02-27 ENCOUNTER — Ambulatory Visit: Admit: 2010-02-27 | Payer: Self-pay

## 2010-02-27 ENCOUNTER — Ambulatory Visit: Admit: 2010-02-27 | Payer: Self-pay | Admitting: Cardiovascular Disease

## 2010-02-27 ENCOUNTER — Encounter: Payer: Self-pay | Admitting: Cardiovascular Disease

## 2010-02-27 ENCOUNTER — Ambulatory Visit (INDEPENDENT_AMBULATORY_CARE_PROVIDER_SITE_OTHER): Payer: 59 | Admitting: Cardiovascular Disease

## 2010-02-27 ENCOUNTER — Ambulatory Visit (HOSPITAL_COMMUNITY): Payer: 59 | Attending: Cardiovascular Disease

## 2010-02-27 DIAGNOSIS — I10 Essential (primary) hypertension: Secondary | ICD-10-CM | POA: Insufficient documentation

## 2010-02-27 DIAGNOSIS — I079 Rheumatic tricuspid valve disease, unspecified: Secondary | ICD-10-CM | POA: Insufficient documentation

## 2010-02-27 DIAGNOSIS — I428 Other cardiomyopathies: Secondary | ICD-10-CM

## 2010-02-27 DIAGNOSIS — I059 Rheumatic mitral valve disease, unspecified: Secondary | ICD-10-CM | POA: Insufficient documentation

## 2010-02-27 DIAGNOSIS — Z7901 Long term (current) use of anticoagulants: Secondary | ICD-10-CM

## 2010-02-27 DIAGNOSIS — I4891 Unspecified atrial fibrillation: Secondary | ICD-10-CM

## 2010-03-02 ENCOUNTER — Ambulatory Visit: Payer: Self-pay | Admitting: Cardiovascular Disease

## 2010-03-04 NOTE — Progress Notes (Signed)
Summary: Echo appt.  Phone Note Outgoing Call Call back at [facility] Phone [phone] Call back at [phone] CELL   Call placed by: Stanton Kidney, EMT-P,  February 26, 2010 2:13 PM Call placed to: Patient Action Taken: Phone Call Completed Summary of Call: S/W patient, instructed to arrive 30 min prior to appt. Stanton Kidney, EMT-P  February 26, 2010 2:17 PM

## 2010-03-04 NOTE — Letter (Signed)
Summary: Return To Work  [facility], Main Office  [address]   [place], [place] [address]   Phone: [phone]  Fax: [phone]    02/27/2010  TO: WHOM IT MAY CONCERN   RE: Aaron Thompson [address] [place]   The above named individual is under my medical care and may return to work on 03/02/10. He is off of his blood thinner, Pradaxa and no longer has any work restrictions.  If you have any further questions or need additional information, please call.     Sincerely,   Dr. Charlton Haws Ellender Hose RN

## 2010-03-04 NOTE — Assessment & Plan Note (Signed)
Summary: F3M/DM/RS FROM BUMP LIST-MB/BH appt confirm w/pt=Aaron Thompson   Vital Signs:  Patient profile:   50 year old male Height:      76 inches Weight:      306 pounds Pulse rate:   82 / minute BP sitting:   136 / 94  (left arm) Cuff size:   large  Vitals Entered By: Burnett Kanaris, CNA (February 27, 2010 9:27 AM)  Primary Provider:  Dr. Milinda Antis   History of Present Illness: F/U for PAF on Pradaxa with [facility] in June 2011.  Nonischemic DCM.  Reviewed echo from today and EF stable around 30% with mild MR.  Fuincitonal class one.  Wants to come off Pradaxa as he cant work first shift on it.  I think this is fine since he has not had any cliniical recurrence and [place] score is 1.  No PND, orhtopnea, or edema.  Golfing and active with his son.  Compliant with meds.  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) 2)  Sinusitis - Acute-nos  (ICD-461.9) 3)  Hypertension  (ICD-401.9) 4)  Atrial Fibrillation  (ICD-427.31) 5)  Colonic Polyps  (ICD-211.3) 6)  Osteoarthrosis Unspec Whether Gen/localized Hand  (ICD-715.94) 7)  Tinea Corporis  (ICD-110.5) 8)  Renal Calculus  (ICD-592.0) 9)  Gerd  (ICD-530.81) 10)  Fatty Liver Disease  (ICD-571.8) 11)  Abdominal Pain, Right Upper Quadrant  (ICD-789.01) 12)  Hearing Impairment  (ICD-389.9) 13)  Hx of Folliculitis  (ICD-704.8) 14)  Osteoarthritis  (ICD-715.90) 15)  Anxiety  (ICD-300.00)  Current Medications (verified): 1)  Hyzaar 100-12.5 Mg Tabs (Losartan Potassium-Hctz) .[name].. 1 Qd 2)  Carvedilol 25 Mg Tabs (Carvedilol) .[name].. 1 Two Times A Day 3)  Multivitamins   Tabs (Multiple Vitamin) .... Take 1 Tablet By Mouth Once A Day 4)  Ventolin Hfa 108 (90 Base) Mcg/act Aers (Albuterol Sulfate) .... 2 Puffs Every 4-6 Hours As Needed For Wheeze 5)  Zegerid Otc 20-1100 Mg Caps (Omeprazole-Sodium Bicarbonate) .... Otc As Directed. 6)  Mucinex 600 Mg Xr12h-Tab (Guaifenesin) .... Otc As Directed.prn 7)  Pradaxa 150 Mg Caps  (Dabigatran Etexilate Mesylate) .[name].. 1  Tab By Mouth Two Times A Day 8)  Digoxin 0.25 Mg/ml Soln (Digoxin) .[name].. 1 Once Daily  Allergies (verified): No Known Drug Allergies  Past History:  Past Medical History: Last updated: 07/06/2009 Current Problems:  HYPERTENSION (ICD-401.9) ATRIAL FIBRILLATION (ICD-427.31) COLONIC POLYPS (ICD-211.3) OSTEOARTHROSIS UNSPEC WHETHER GEN/LOCALIZED HAND (ICD-715.94) TINEA CORPORIS (ICD-110.5) DIARRHEA (ICD-787.91) GASTROENTERITIS (ICD-558.9) RENAL CALCULUS (ICD-592.0) GERD (ICD-530.81) FATTY LIVER DISEASE (ICD-571.8) ABDOMINAL PAIN, RIGHT UPPER QUADRANT (ICD-789.01) HEARING IMPAIRMENT (ICD-389.9) Hx of FOLLICULITIS (ICD-704.8) OSTEOARTHRITIS (ICD-715.90) ANXIETY (ICD-300.00) non ischemic cardiomyopathy 6/11 afib - with cardioversion 6/11  Past Surgical History: Last updated: 12/16/2009 Stress cardiolite- normal EF 52% (12/02/1998) Stress cardiolite- normal, EF 42% (08/2005) Neck surgery- plate, fusion Knee surgery- bilateral  Stress test- neg per pt (2006) colonoscopy diverticulosis and polyps- 8/08 (next due in 5 y) EGD GERD 8/08 1/09 abd [place] fatty liver (no gallstones) non ischemic cardiomyopathy - hosp 6/11 cath and echo  afib - resolved with cardioversion  6/11 echo 11/11 with decreased EF of 35-40%   Family History: Last updated: 10/04/2006 Father: HTN Mother: HTN Siblings: 3 brothers, 4 sisters  Social History: Last updated: 11/25/2006 Marital Status: Married Children: 2 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   Physical Exam  General:  Affect appropriate Healthy:  appears stated age HEENT: normal Neck supple with no adenopathy JVP normal no bruits no thyromegaly Lungs clear with no  wheezing and good diaphragmatic motion Heart:  S1/S2 no murmur,rub, gallop or click PMI normal Abdomen: benighn, BS positve, no tenderness, no AAA no bruit.  No HSM or HJR Distal pulses intact with no bruits No edema Neuro non-focal Skin warm and dry    Impression & Recommendations:  Problem # 1:  CARDIOMYOPATHY OTHER DISEASES CLASSIFIED ELSW (ICD-425.8) EF 30% stable continue current meds His updated medication list for this problem includes:    Hyzaar 100-12.5 Mg Tabs (Losartan potassium-hctz) .[name]... 1 qd    Carvedilol 25 Mg Tabs (Carvedilol) .[name]... 1 two times a day    Digoxin 0.25 Mg/ml Soln (Digoxin) .[name]... 1 once daily  Problem # 2:  ATRIAL FIBRILLATION (ICD-427.31) Maint NSR by ECG today LBBB.  Stop Pradax and resume ASA His updated medication list for this problem includes:    Carvedilol 25 Mg Tabs (Carvedilol) .[name]... 1 two times a day    Digoxin 0.25 Mg/ml Soln (Digoxin) .[name]... 1 once daily  Problem # 3:  HYPERTENSION (ICD-401.9) Well controlled continue current meds His updated medication list for this problem includes:    Hyzaar 100-12.5 Mg Tabs (Losartan potassium-hctz) .[name]... 1 qd    Carvedilol 25 Mg Tabs (Carvedilol) .[name]... 1 two times a day  Appended Document: F3M/DM/RS FROM BUMP LIST-MB/BH appt confirm w/pt=Aaron Thompson    Clinical Lists Changes  Medications: Removed medication of PRADAXA 150 MG CAPS (DABIGATRAN ETEXILATE MESYLATE) 1  tab by mouth two times a day Added new medication of ASPIRIN 81 MG TBEC (ASPIRIN) Take one tablet by mouth daily

## 2010-03-25 ENCOUNTER — Telehealth: Payer: Self-pay | Admitting: Cardiovascular Disease

## 2010-03-30 ENCOUNTER — Ambulatory Visit: Payer: 59 | Admitting: Physician Assistant

## 2010-03-30 ENCOUNTER — Ambulatory Visit: Payer: 59 | Admitting: Nurse Practitioner

## 2010-04-02 NOTE — Progress Notes (Signed)
Summary: pt having sob   Phone Note Call from Patient   Caller: Patient [phone] Reason for Call: Talk to Nurse Summary of Call: having sob x 2 days off and on Initial call taken by: Glynda Jaeger,  March 25, 2010 10:05 AM  Follow-up for Phone Call        spoke with pt, he has been having occ episodes of SOB. he notices them when he is lying in bed or resting. they last acouple minutes and go away. he has no SOB with exerction. he denies edema or weight change. he will occ have a productive cough of clear sputum. he is also occ having a tight feeling in his chest not related to exerction. he will sometimes notice his heart skip with the SOB. appt made for pt to see scott weaver on monday 03-30-10. he will call prior to that appt if something changes Deliah Goody, RN  March 25, 2010 2:45 PM

## 2010-04-08 LAB — CREATININE, SERUM: GFR calc Af Amer: >60 mL/min (ref >60)

## 2010-04-08 LAB — BUN: BUN: 11 mg/dL (ref 6–23)

## 2010-04-13 LAB — BASIC METABOLIC PANEL
Calcium: 8.6 mg/dL (ref 8.4–10.5)
Chloride: 105 mEq/L (ref 96–112)
Chloride: 107 mEq/L (ref 96–112)
Creatinine, Ser: 0.89 mg/dL (ref 0.4–1.5)
GFR calc Af Amer: >60 mL/min (ref >60)
GFR calc non Af Amer: >60 mL/min (ref >60)
GFR calc non Af Amer: >60 mL/min (ref >60)
Glucose, Bld: 175 mg/dL — ABNORMAL HIGH (ref 70–99)
Potassium: 3.3 mEq/L — ABNORMAL LOW (ref 3.5–5.1)
Potassium: 3.8 mEq/L (ref 3.5–5.1)
Sodium: 138 mEq/L (ref 135–145)
Sodium: 139 mEq/L (ref 135–145)

## 2010-04-13 LAB — COMPREHENSIVE METABOLIC PANEL
ALT: 73 U/L — ABNORMAL HIGH (ref 0–53)
AST: 42 U/L — ABNORMAL HIGH (ref 0–37)
CO2: 31 mEq/L (ref 19–32)
Calcium: 8.7 mg/dL (ref 8.4–10.5)
GFR calc Af Amer: >60 mL/min (ref >60)
Potassium: 4 mEq/L (ref 3.5–5.1)
Sodium: 143 mEq/L (ref 135–145)
Total Protein: 5.7 g/dL — ABNORMAL LOW (ref 6.0–8.3)

## 2010-04-13 LAB — CARDIAC PANEL(CRET KIN+CKTOT+MB+TROPI)
CK, MB: 3.9 ng/mL (ref 0.3–4.0)
Total CK: 167 U/L (ref 7–232)

## 2010-04-13 LAB — POCT I-STAT 3, ART BLOOD GAS (G3+)
Acid-Base Excess: 2 mmol/L (ref 0.0–2.0)
pH, Arterial: 7.464 — ABNORMAL HIGH (ref 7.350–7.450)
pO2, Arterial: 75 mmHg — ABNORMAL LOW (ref 80.0–100.0)

## 2010-04-13 LAB — CBC
HCT: 44.4 % (ref 39.0–52.0)
HCT: 44.6 % (ref 39.0–52.0)
Hemoglobin: 15.1 g/dL (ref 13.0–17.0)
RBC: 5.1 MIL/uL (ref 4.22–5.81)
WBC: 10.7 10*3/uL — ABNORMAL HIGH (ref 4.0–10.5)
WBC: 10.7 10*3/uL — ABNORMAL HIGH (ref 4.0–10.5)

## 2010-04-13 LAB — CK TOTAL AND CKMB (NOT AT ARMC): CK, MB: 4.8 ng/mL — ABNORMAL HIGH (ref 0.3–4.0)

## 2010-04-13 LAB — APTT: aPTT: 26 seconds (ref 24–37)

## 2010-04-13 LAB — PROTIME-INR
INR: 1.04 (ref 0.00–1.49)
Prothrombin Time: 13.5 seconds (ref 11.6–15.2)

## 2010-04-13 LAB — POCT I-STAT 3, VENOUS BLOOD GAS (G3P V)
O2 Saturation: 66 %
TCO2: 28 mmol/L (ref 0–100)
pO2, Ven: 33 mmHg (ref 30.0–45.0)

## 2010-04-15 ENCOUNTER — Emergency Department (HOSPITAL_COMMUNITY): Payer: 59

## 2010-04-15 ENCOUNTER — Emergency Department (HOSPITAL_COMMUNITY)
Admission: EM | Admit: 2010-04-15 | Discharge: 2010-04-15 | Disposition: A | Discharge status: Home / Self Care | Payer: 59 | Attending: Emergency Medicine | Admitting: Emergency Medicine

## 2010-04-15 DIAGNOSIS — R002 Palpitations: Secondary | ICD-10-CM | POA: Insufficient documentation

## 2010-04-15 DIAGNOSIS — K219 Gastro-esophageal reflux disease without esophagitis: Secondary | ICD-10-CM | POA: Insufficient documentation

## 2010-04-15 DIAGNOSIS — I10 Essential (primary) hypertension: Secondary | ICD-10-CM | POA: Insufficient documentation

## 2010-04-15 DIAGNOSIS — I4891 Unspecified atrial fibrillation: Secondary | ICD-10-CM | POA: Insufficient documentation

## 2010-04-15 DIAGNOSIS — R0602 Shortness of breath: Secondary | ICD-10-CM | POA: Insufficient documentation

## 2010-04-16 ENCOUNTER — Telehealth: Payer: Self-pay | Admitting: Cardiovascular Disease

## 2010-04-16 NOTE — Telephone Encounter (Signed)
A  Follow up visit  for pt. on 04/28/10 at 2:30 Pm was made . I left a message for pt. to call back if date and time is fine with him.

## 2010-04-16 NOTE — Telephone Encounter (Signed)
Patient aware of date and time of appointment

## 2010-04-22 ENCOUNTER — Telehealth: Payer: Self-pay | Admitting: Cardiovascular Disease

## 2010-04-22 NOTE — Telephone Encounter (Signed)
Spoke with pt, he had an episode of heart skipping and feeling faint today. He went to the er and everything checked out fine. He is scheduled to see the pa next week for follow up.

## 2010-04-28 ENCOUNTER — Ambulatory Visit: Payer: 59 | Admitting: Physician Assistant

## 2010-05-11 ENCOUNTER — Encounter: Payer: Self-pay | Admitting: Physician Assistant

## 2010-05-12 ENCOUNTER — Encounter: Payer: Self-pay | Admitting: Physician Assistant

## 2010-05-12 ENCOUNTER — Ambulatory Visit (INDEPENDENT_AMBULATORY_CARE_PROVIDER_SITE_OTHER): Payer: 59 | Admitting: Physician Assistant

## 2010-05-12 VITALS — BP 120/78 | HR 78 | Ht 76.0 in | Wt 305.0 lb

## 2010-05-12 DIAGNOSIS — I4891 Unspecified atrial fibrillation: Secondary | ICD-10-CM

## 2010-05-12 DIAGNOSIS — I43 Cardiomyopathy in diseases classified elsewhere: Secondary | ICD-10-CM

## 2010-05-12 DIAGNOSIS — R002 Palpitations: Secondary | ICD-10-CM | POA: Insufficient documentation

## 2010-05-12 NOTE — Patient Instructions (Signed)
Your physician recommends that you return for lab work in: TODAY BMET, DIGOXIN LEVEL, MAGNESIUM, TSH 785.1.  Your physician has recommended that you wear an event monitor 785.1, 427.31. Event monitors are medical devices that record the heart's electrical activity. Doctors most often [place] these monitors to diagnose arrhythmias. Arrhythmias are problems with the speed or rhythm of the heartbeat. The monitor is a small, portable device. You can wear one while you do your normal daily activities. This is usually used to diagnose what is causing palpitations/syncope (passing out).

## 2010-05-12 NOTE — Assessment & Plan Note (Addendum)
Echo done in 2/12 with stable EF at 30%.  He is NYHA class 1.  At this point, he would not be a candidate for ICD.  If he is having NSVT, he will likely need EP referral.

## 2010-05-12 NOTE — Assessment & Plan Note (Signed)
Maintaining NSR today.  He was in NSR in the ED.  Will check monitor as noted.  His CHADS2-VASc score is 2 (coronary plaque on cath and +HTN).  Could consider resuming anticoagulation.  Will continue ASA for now.  Will see what his monitor shows first.

## 2010-05-12 NOTE — Assessment & Plan Note (Signed)
Symptoms may represent recurrent paroxysmal atrial fibrillation.  He also has a DCM with EF 30%.  He did have near syncope with one episode.  We need to r/o NSVT.  Will arrange an event monitor.  Also, check a BMET, Mg, TSH and digoxin level.  Continue coreg and digoxin.  Follow up with Dr. Eden Emms in 4 weeks.  Return sooner prn.

## 2010-05-12 NOTE — Progress Notes (Signed)
History of Present Illness: Primary Cardiologist:  Dr. Rhea Belton is a 50 y.o. male with a h/o NICM with EF 30% and paroxysmal atrial fibrillation s/p DCCV in 6/11.  He was previously on Pradaxa.  This was stopped in 2/12.  His CHADS2 score is 1 with HTN.  He presented to the ED recently on 3/21 with palpitations.  No labs were drawn.  His ECG is reviewed and demonstrated NSR, HR 80, PRWP, IVCD, NSSTTW changes (no change when compared to prior tracings).  CXR was non-acute.  He returns today for follow up.  He notes 2 episodes since 2/12 in which he has had palpitations.  It occurred at rest.  He felt short of breath with it.  He was able to feel his pulse and it was irregular.  It felt like he had a pause as well.  The 2nd time it happened, he was walking back from his mailbox.  He felt near-syncopal.  He denies syncope.  The episodes lasted several minutes.  He denies chest pain.  No orthopnea or PND.  No significant edema.  No weight gain.  He is NYHA class 1 (he can walk up 65 steps at the [facility] without stopping).  He does note a lot of stress.  He is not interested in taking anxiolytics.  Past Medical History  Diagnosis Date  . Hypertension   . Benign neoplasm of colon   . DJD (degenerative joint disease)   . Dermatophytosis of the body   . Calculus of kidney   . Esophageal reflux   . Other chronic nonalcoholic liver disease     fatty liver  . Unspecified hearing loss   . Anxiety state, unspecified   . Non-ischemic cardiomyopathy     a.  cath 6/11: OM2 30%; EF 30%;  b. echo 2/12: EF 30%, mild MR, mild LAE  . Atrial fibrillation     s/p DCCV 6/11; previously on Pradaxa    Current Outpatient Prescriptions  Medication Sig Dispense Refill  . albuterol (VENTOLIN HFA) 108 (90 BASE) MCG/ACT inhaler Inhale 2 puffs into the lungs every 6 (six) hours as needed.        [name] aspirin 81 MG EC tablet Take 81 mg by mouth daily.        . carvedilol (COREG) 25 MG tablet Take 25 mg  by mouth 2 (two) times daily with a meal.        . digoxin (LANOXIN) 0.25 MG tablet Take 250 mcg by mouth daily.        [name] guaiFENesin (MUCINEX) 600 MG 12 hr tablet Take 1,200 mg by mouth as needed.        [name] losartan-hydrochlorothiazide (HYZAAR) 100-12.5 MG per tablet Take 1 tablet by mouth daily.        . Multiple Vitamin (MULTIVITAMIN) tablet Take 1 tablet by mouth daily.        Maxwell Caul Bicarbonate (ZEGERID OTC) 20-1100 MG CAPS Take 1 capsule by mouth as directed.         History  Substance Use Topics  . Smoking status: Unknown If Ever Smoked  . Smokeless tobacco: Not on file  . Alcohol Use:     No Known Allergies  ROS:  Please see HPI.  No fevers, chills, cough, melena, hematochezia.  He denies any OTC cold meds or caffeine use.  Vital Signs: BP 120/78  Pulse 78  Ht 6\' 4"  (1.93 m)  Wt 305 lb (138.347 kg)  BMI 37.13  kg/m2  PHYSICAL EXAM: Well nourished, well developed, in no acute distress HEENT: normal Neck: no JVD Endocrine: no thyromegaly Cardiac:  normal S1, S2; RRR; no murmur Lungs:  clear to auscultation bilaterally, no wheezing, rhonchi or rales Abd: soft, nontender, no hepatomegaly Ext: no edema Skin: warm and dry Neuro:  CNs 2-12 intact, no focal abnormalities noted Psych: normal affect  EKG:  NSR, HR 78, TW inversions V3-6;  Repeat ECG with HR 88, NSR, LBBB  ASSESSMENT AND PLAN:

## 2010-05-13 LAB — DIGOXIN LEVEL: Digoxin Level: 0.7 ng/mL — ABNORMAL LOW (ref 0.8–2.0)

## 2010-05-13 LAB — BASIC METABOLIC PANEL
Chloride: 103 mEq/L (ref 96–112)
Potassium: 3.9 mEq/L (ref 3.5–5.1)
Sodium: 141 mEq/L (ref 135–145)

## 2010-05-13 LAB — TSH: TSH: 0.92 u[IU]/mL (ref 0.35–5.50)

## 2010-05-13 LAB — MAGNESIUM: Magnesium: 2.1 mg/dL (ref 1.5–2.5)

## 2010-05-15 ENCOUNTER — Encounter (INDEPENDENT_AMBULATORY_CARE_PROVIDER_SITE_OTHER): Payer: 59

## 2010-05-15 DIAGNOSIS — R002 Palpitations: Secondary | ICD-10-CM

## 2010-05-21 ENCOUNTER — Inpatient Hospital Stay (INDEPENDENT_AMBULATORY_CARE_PROVIDER_SITE_OTHER)
Admission: RE | Admit: 2010-05-21 | Discharge: 2010-05-21 | Disposition: A | Discharge status: Home / Self Care | Payer: Worker's Compensation | Source: Ambulatory Visit | Attending: Emergency Medicine | Admitting: Emergency Medicine

## 2010-05-21 ENCOUNTER — Ambulatory Visit (INDEPENDENT_AMBULATORY_CARE_PROVIDER_SITE_OTHER): Payer: Worker's Compensation

## 2010-05-21 DIAGNOSIS — M79609 Pain in unspecified limb: Secondary | ICD-10-CM

## 2010-05-21 DIAGNOSIS — S0003XA Contusion of scalp, initial encounter: Secondary | ICD-10-CM

## 2010-06-09 NOTE — Assessment & Plan Note (Signed)
[facility]                            CARDIOLOGY OFFICE NOTE   Aaron Thompson, Aaron Thompson                       MRN:          [id number]  DATE:09/11/2007                            DOB:          Jun 16, 1960    Kellie returns today for followup.  He has had hypertension, paroxysmal  atrial fibrillation, and problems with weight.   The patient has been compliant with his medications.  His blood pressure  seems to be under fairly good control.  His weight got up as high as  320, starting in January.  He started exercise a little bit more.  We  talked about his diet.  He tends to have too many calories.   I explained to him the importance of watching his carbohydrate content.   The patient has otherwise been doing well.  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   He is not having significant palpitations, headaches, or lower extremity  edema.   CURRENT MEDICATIONS:  His current meds include;  1. Aspirin a day.  2. Toprol 50 a day.  3. Altace 10 a day.  4. Protonix 40 a day.   PHYSICAL EXAMINATION:  GENERAL:  His exam is remarkable for a large  white male in no distress.  Affect is appropriate.  VITAL SIGNS:  Weight is 297, blood pressure is 146/82, pulse 76 and  regular, respiratory rate 14, afebrile.  HEENT:  Unremarkable.  Carotids are without bruit.  No lymphadenopathy,  thyromegaly, or JVP elevation.  He has an anterior cervical neck scar.  HEART:  S1 and S2 with normal heart sounds.  PMI normal.  ABDOMEN:  Benign.  Bowel sounds positive.  No AAA, no tenderness, no  bruit, no hepatosplenomegaly, or hepatojugular reflux.  No tenderness.  EXTREMITIES:  Distal pulses are intact.  No edema.  NEURO:  Nonfocal.  SKIN:  Warm and dry.  No muscular weakness.   EKG is normal.   IMPRESSION:  1. Hypertension, well-controlled.  Continue low-salt diet, Altace, and      Toprol.  2. Previously  borderline cholesterol status, check lipid and liver      profile today.  Consider adding statin if LDL is significantly      above 110.  3. History of reflux.  Continue Protonix.  Encourage weight loss.  4. 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  5. Arthritis, p.r.n. Tylenol.  Try to avoid nonsteroidals in regards      to his blood pressure.   Overall, I think Lelon is doing well.  I will see him back in a year.  We  will advise him on his cholesterol when we get the lab work back.     Noralyn Pick. Eden Emms, MD, [facility]  Electronically Signed    PCN/MedQ  DD: 09/11/2007  DT: 09/12/2007  Job #: [id number]

## 2010-06-09 NOTE — Assessment & Plan Note (Signed)
[facility]                         GASTROENTEROLOGY OFFICE NOTE   ZACHARI, ALBERTA                       MRN:          [id number]  DATE:08/17/2006                            DOB:          1960/04/17    REFERRING PHYSICIAN:  Karie Schwalbe, MD   OFFICE CONSULTATION NOTE   REASON FOR CONSULTATION:  Abdominal pain, nausea and vomiting, and  rectal bleeding.   HISTORY:  This is a 50 year old white male with a history of  hypertension, paroxysmal atrial fibrillation, and chronic reflux  disease.  He is referred today through the courtesy of Dr. Alphonsus Sias  regarding the above-listed problems.  First, the patient has noticed  intermittent minor rectal bleeding in recent months.  The blood is  principally on the tissue.  Next, he reports last month developing  problems with cramping right upper quadrant abdominal discomfort.  He  was apparently evaluated at an urgent care center.  Subsequently, he  developed problems with nausea, vomiting, and diarrhea, which lasted  approximately 4 days.  During that time, he lost about 10 pounds.  Thereafter, his symptoms resolved and have not returned.  Finally, the  patient reports a 15-year history of problems with heartburn and  indigestion, for which he has been on multiple agents.  Most recent  years, proton pump inhibitors.  He denies dysphagia or melena.  He  thinks he may have had an upper endoscopy about 20 years ago, though  cannot elaborate.   PAST MEDICAL HISTORY:  1. Hypertension.  2. Paroxysmal atrial fibrillation.  3. Kidney stones.   SURGICAL HISTORY:  1. Appendectomy.  2. Cervical spine fusion at the level of C6-7.  3. Knee surgery x2.   ALLERGIES:  No known drug allergies.   CURRENT MEDICATIONS:  1. Aspirin 325 mg daily.  2. Multivitamin.  3. Toprol XL 50 mg daily.  4. Altace 10 mg daily.  5. Protonix 40 mg daily.   FAMILY HISTORY:  No family history of gastrointestinal  malignancy.  Father and uncle with prostate cancer.  Father with heart disease.  Mother and 2 brothers with diabetes.   SOCIAL HISTORY:  The patient is married.  He has 1 son.  He lives with  his wife and son.  He has a 12th grade education.  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  He  does not smoke.  Does not use alcohol.  He quit chewing tobacco about 4  months ago.   REVIEW OF SYSTEMS:  Per diagnostic evaluation form.   PHYSICAL EXAM:  A pleasant well-appearing male in no acute distress.  Blood pressure 110/82, heart rate is 76 and regular, weight is 300  pounds.  He is 6 feet 4 inches in height.  HEENT:  Sclerae anicteric.  Conjunctivae pink.  Oral mucosa is intact.  Does have a cold sore on his upper lip.  LUNGS:  Clear.  HEART:  Regular.  ABDOMEN:  Soft without tenderness, mass, or hernia.  Good bowel sounds  heard.  EXTREMITIES:  Without edema.  RECTAL:  Deferred.  IMPRESSION:  1. Recent problems with right upper quadrant abdominal pain, nausea      and vomiting, and diarrhea.  Most likely acute gastroenteritis.      Rule out gallbladder disease.  2. Chronic reflux disease requiring proton pump inhibitor therapy for      control.  3. Intermittent rectal bleeding, minor by history, suspect benign      inner rectal pathology, rule out neoplasia.   RECOMMENDATIONS:  1. Continue Protonix for reflux.  2. Reflux precautions.  3. Abdominal ultrasound, rule out gall stones.  4. Colonoscopy to evaluate rectal bleeding.  The nature of the      procedure as well as the risks, benefits, and alternatives have      been reviewed.  He understood and agreed to proceed.  5. Upper endoscopy to rule out Barrett's esophagus in an obese white      male with symptoms of reflux greater than 15 years.  6. Ongoing general medical care with Dr. Milinda Antis.     Wilhemina Bonito. Marina Goodell, MD  Electronically Signed    JNP/MedQ  DD: 08/17/2006  DT: 08/17/2006   Job #: [id number]   cc:   Karie Schwalbe, MD  Audrie Gallus Milinda Antis, MD

## 2010-06-09 NOTE — Assessment & Plan Note (Signed)
[facility]                            CARDIOLOGY OFFICE NOTE   [name], [name]                       MRN:          [id number]  DATE:05/30/2006                            DOB:          03-27-1960    Aaron Thompson returns today for followup. I have seen him in the past for  atypical chest pain. He has had normal stress tests in the past. His  last Myoview was in 2006.   He has had normal LV function by echo on multiple occasions.   We primarily follow him for his blood pressure. Interestingly his weight  is up today. I talked to him about this and he has stopped chewing  tobacco. He had been chewing since the age of 74. He suspects that this  is the reason for his increased appetite. I congratulated him on  stopping his chewing tobacco.   We talked a little bit about his diet and how to decrease his caloric  intake.   The patient has been compliant with his hypertensives medicines. He has  been on Toprol and Altace, both generic at this point. He takes blood  pressure at home from time to time and he has been running under 140  systolic and under 85 diastolic. The patient has not had any significant  headache, TIAs, lower extremity edema, chest pain, PND or orthopnea.   REVIEW OF SYSTEMS:  Otherwise remarkable for some anxiety. He is not  sure why, he thinks it is job stress. 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 I encouraged him to followup with Dr. Hetty Ely if he thinks  that he needs to be on any antidepressants for agitated depression.   He says it is not an everyday occurrence but sometimes he needs to call  his wife just because he does not feel right.   CURRENT MEDICATIONS:  1. Aspirin a day.  2. Toprol 50 a day.  3. Altace 10 a day.  4. Protonix 40 a day.   PHYSICAL EXAMINATION:  GENERAL:  A very large, middle-aged, white male  in no distress.  VITAL SIGNS:  His weight is 309 pounds. His blood pressure is 140/85,  pulse is 80 and  regular. He is afebrile. Respiratory rate is 14.  HEENT:  Normal. There is no jugular venous distention, no thyromegaly,  no carotid bruits.  LUNGS:  Clear. There is no accessory muscle use.  HEART:  S1, S2 without murmur, rub, gallop or click. PMI is mildly  enlarged but not displaced.  ABDOMEN:  Benign. There are no masses, no hepatosplenomegaly, no  hepatojugular reflux, no AAA and no renal bruits.  EXTREMITIES:  Distal pulses are intact with no edema.  NEUROLOGIC:  Nonfocal.  SKIN:  Warm and dry. There is no muscular weakness.   IMPRESSION:  Stable. History of hypertension compliant with medications.  Continue beta blocker and Altace. In the future if his blood pressure  were to rise, I would add a diuretic.   He should probably have a stress test in a year, his last one was in  2006.   Risk  factor modification is otherwise per Dr. Hetty Ely.   I encouraged him to followup with him in case his anxiety worsens.   He will continue his Protonix for reflux symptoms. In the past, he has  had some atypical chest pain that was thought due to reflux and stress-  induced acid production.   He will continue to take an aspirin a day for stroke prophylaxis.   I will see him back in 6 months unless he has an issue with his blood  pressure medicine.     Noralyn Pick. Eden Emms, MD, [facility]  Electronically Signed    PCN/MedQ  DD: 05/30/2006  DT: 05/30/2006  Job #: [id number]

## 2010-06-12 NOTE — H&P (Signed)
[place]. [facility]  Patient:    Aaron Thompson, Aaron Thompson Visit Number: [id number] MRN: [id number]          Service Type: SUR Location: 3000 [id number] Attending Physician:  Emeterio Reeve Dictated by:   Payton Doughty, M.D. Admit Date:  04/11/2001 Discharge Date: 04/13/2001                           History and Physical  ADMITTING DIAGNOSIS:  C5-6 and C6-7 spondylosis with radiculopathy.  SERVICE:  Neurosurgery.  HISTORY OF PRESENT ILLNESS:  This is a 50 year old right-handed white gentleman who was having neck pain over the past couple of months and got a lot worse a week ago.  MRI demonstrates a disk at 5-6 and 6-7 and he is referred to me by Dr. Jonny Ruiz L. Rendall III.  Pain is in his left arm and some pain into his right shoulder.  PAST MEDICAL HISTORY:  Medical history is otherwise benign.  He has had episodic atrial fibrillation.  MEDICATIONS: 1. Toprol 50 mg a day. 2. Clarinex once a day. 3. Celebrex twice a day.  ALLERGIES:  He has no allergies.  SURGICAL HISTORY:  Appendectomy and both knees with arthroscopies.  SOCIAL HISTORY:  He does not smoke or drink, works as a Electronics engineer at [employer].  FAMILY HISTORY:  Mom is 72 with heart disease.  Daddy has passed away with heart failure, age was not given.  REVIEW OF SYSTEMS:  Remarkable for arm weakness and arm pain.  PHYSICAL EXAMINATION:  HEENT:  Within normal limits.  NECK:  He has reasonable range of motion in his neck but extension reproduces his interscapular and left shoulder and arm pain.  CHEST:  Clear.  CARDIAC:  Regular rate and rhythm.  ABDOMEN:  Nontender.  There is no hepatosplenomegaly.  EXTREMITIES:  Without clubbing or cyanosis.  GU:  Deferred.  PERIPHERAL PULSES:  Good.  NEUROLOGIC:  He is awake, alert and oriented.  His cranial nerves are intact. Motor exam shows 5/5 strength throughout the upper extremities and lower extremities.  Sensory deficit is  described in the left C6 distribution. Reflexes are 1 at the biceps bilaterally, 1 at the triceps bilaterally, 2 at the brachioradialis, 2 at the knees, 2 at the ankles.  LABORATORY AND ACCESSORY DATA:  MRI shows a central disk, slightly left-sided at 5-6, it narrows at 6 down to 9 mm; 6-7 has a herniated disk eccentric to the left with elevation of the left C7 root.  CLINICAL IMPRESSION:  Mixed C6 and 7 radiculopathy secondary to herniated disk at C5-6 and C6-7.  PLAN:  Plan is for anterior cervical diskectomies and fusion at C5-6 and C6-7. The risks and benefits of this approach have been discussed with him and he wishes to proceed. Dictated by:   Payton Doughty, M.D. Attending Physician:  Emeterio Reeve DD:  04/11/01 TD:  04/12/01 Job: [id number]

## 2010-06-12 NOTE — Discharge Summary (Signed)
[place]  Patient:    Aaron Thompson, Aaron Thompson                       MRN: [id number] Adm. Date:  [id number] Disc. Date: [id number] Attending:  Colon Branch Dictator:   Brita Romp, P.A.-C.                           Discharge Summary  DISCHARGE DIAGNOSES: 1. Chest pain, negative cardiac enzymes. 2. History of paroxysmal atrial fibrillation.  HOSPITAL COURSE:  The patient is a 50 year old male who had recently been an inpatient at [facility] with atrial fibrillation and rapid ventricular response.  He was treated with flecainide and converted to normal sinus rhythm.  At the time of discharge, he noted some intermittent chest pain which was not tied to exertion.  He stated it was worse with cough, but not with deep breathing.  He described the pain as somewhat pressure-like.  At approximately 2 a.m., the patient awoke with more chest pain and some diaphoresis.  The symptoms resolved spontaneously.  He was seen and admitted by Dr. Lewayne Bunting.  Dr. Ladona Ridgel felt that the symptoms were rather atypical, and planned to admit the patient to rule him out for myocardial infarction with serial enzymes.  If they are positive, the patient would need a left heart catheterization.  If they were negative, he could be set up as an outpatient stress test.  Later in the day, the patient was seen by Dr. Eden Emms.  Dr. Eden Emms noted that serial enzymes were negative and felt that the patient was stable for discharge with an outpatient stress test.  DISCHARGE MEDICATIONS: 1. Toprol XL 50 mg q.d. 2. Prevacid 20 mg q.d. 3. Coated aspirin 325 mg q.d.  DISCHARGE INSTRUCTIONS: 1. The patient is advised to continue his normal level of activity. 2. He is advised to follow a low fat diet. 3. He is to follow up with a stress test in the [place] office on September 12, 2000, at 12:45. 4. He is to follow up with Dr. Eden Emms in November as previously  scheduled.  LABORATORY VALUES:  Sodium 141, potassium 3.5, chloride 106, CO2 28, BUN 23, creatinine 1.2, glucose 107.  White count 10.5, hemoglobin 15.6, hematocrit 47.2, platelets 212.  Electrocardiogram revealed normal sinus rhythm at 89.  P-R interval 0.150, QRS 0.092, QTC 0.431, axis 54. DD:  09/08/00 TD:  09/08/00 Job: [id number]

## 2010-06-12 NOTE — Discharge Summary (Signed)
[place]. [facility]  Patient:    NOLAWI, KANADY                       MRN: [id number] Adm. Date:  [id number] Attending:  Lorre Nick Dictator:   Delton See, P.A. CC:         Laurita Quint, M.D. [facility]  Nathen May, M.D., [facility]   Referring Physician Discharge [place]  DATE OF BIRTH:  06-25-2060  BRIEF HISTORY:  Mr. Ericson is a 50 year old male with a history of paroxysmal atrial fibrillation.  He presented to the [facility] Emergency Room with atrial fibrillation on his monitor.  The patient is very aware when he is in normal sinus rhythm and when he is in atrial fibrillation. He felt that the atrial fibrillation occurred approximately two hours before admission.  He was admitted for further treatment.  PAST MEDICAL HISTORY:  Significant for atrial fibrillation diagnosed in 1993. He has been hospitalized x 3 and cardioverted x 1.  In 1996, he had a TEE-guided cardioversion.  He has gastroesophageal reflux disease.  He had an appendectomy 20 years ago.  He has a history of renal calculi.  He has had knee surgery.  He had a stress Cardiolite approximately one year that was normal, according to the patient.  He had a 2-D echocardiogram in 1995 that revealed an ejection fraction of 60% and was essentially negative.  Apparently he had an EP study in the past as well.  ALLERGIES:  KEFLEX secondary to GI intolerance and VICODIN causes a rash.  SOCIAL HISTORY:  The [place] with his wife and two children.  He works for the [employer].  He is active. He uses Skoal smokeless tobacco.  He does not use drugs or alcohol.  HOSPITAL COURSE:  As noted, this patient presented to m hospital course for further treatment of atrial fibrillation.  He received IV Lopressor in the emergency room, as well as Cardizem and remained in atrial fibrillation.  He was started on flecainide by Gerrit Friends. Dietrich Pates, M.D.  By the following day, he had converted to normal sinus rhythm.  The patient did have runs of a wide complex tachycardia which was initially felt to be ventricular tachycardia, however, upon further evaluation from Doylene Canning. Ladona Ridgel, M.D., it was noted that this was atrial fibrillation with aberrancy.  The flecainide was discontinued. He had also been started on Lovenox.  This was discontinued.  He was sent home home his previous home medications, although Toprol XL was increased from 50 mg daily to 50 mg b.i.d.  LABORATORY DATA:  The basic metabolic panel on the day of discharge was within normal limits with a potassium of 4.1 and a magnesium of 2.3.  CK-MB and troponin enzymes were negative.  A CBC was within normal limits.  A portable chest x-ray showed no acute abnormalities.  An admission EKG showed atrial fibrillation, rate 159 beats per minute.  DISCHARGE MEDICATIONS: 1. Toprol XL 50 mg b.i.d. 2. Coated aspirin 325 mg daily. 3. Vitamins as previously taken 4. Prevacid 30 mg daily.  ACTIVITY:  To be as tolerated.  DIET:  He was to be on a low-salt, low-fat diet.  FOLLOW-UP:  He was told to call the office on Monday for a follow-up appointment with Nathen May, M.D., F.A.C.C.  He was to see Laurita Quint, M.D., as needed.  PROBLEM LIST AT THE TIME OF DISCHARGE:  1. Atrial fibrillation converted pharmacologically this admission. 2. History of gastroesophageal reflux disease. 3. Status post appendectomy 20 years ago. 4. History of nephrolithiasis. 5. History of knee surgery. 6. Cardiolite approximately one year ago, negative per patient history. 7. 2-D echocardiogram performed in 1995 with ejection fraction 60%, otherwise    normal. 8. Previous electrophysiology study. 9. Previous episodes of atrial fibrillation requiring direct current    cardioversion. DD:  09/04/00 TD:  09/04/00 Job: [id number]

## 2010-06-12 NOTE — Op Note (Signed)
[place]. [facility]  Patient:    Aaron Thompson, CROCHET Visit Number: [id number] MRN: [id number]          Service Type: SUR Location: 3000 [id number] 01 Attending Physician:  Emeterio Reeve Dictated by:   Payton Doughty, M.D. Proc. Date: 04/11/01 Admit Date:  04/11/2001                             Operative Report  PREOPERATIVE DIAGNOSIS:  Herniated disk at C5-6, C6-7.  POSTOPERATIVE DIAGNOSIS:  Herniated disk at C5-6, C6-7.  OPERATION PERFORMED:  C5-6, C6-7 anterior cervical diskectomy and fusion with tether plate.  SURGEON:  Payton Doughty, M.D.  ANESTHESIA:  General endotracheal.  PREP:  Sterile Betadine prep and scrub with alcohol wipe.  COMPLICATIONS:  None.  ASSISTANT: 1. Cristi Loron, M.D. 2. Clay.  DESCRIPTION OF PROCEDURE:  The patient is a 50 year old  male with herniated disk at C5-6 and C6-C7.  The patient was taken to the operating room, smoothly anesthetized and intubated, and placed supine on the operating table.  He was in the Halter head traction.  Following shave, prep and drape in the usual sterile fashion, the skin was incised starting at the level of the carotid tubercle, extended inferiorly 5 cm paralleling the sternocleidomastoid muscle on the left side.  The platysma was identified, elevated, divided and undermined.  The sternocleidomastoid was identified and medial dissection revealed the carotid artery which was retracted laterally to the left, the trachea and esophagus retracted laterally to the right exposing the bones of the anterior cervical spine.  This gentleman had a rather large bull-like neck. A marker was placed.  Intraoperative x-rays were taken to confirm correctness of the level.  Having confirmed correctness of the level, the longus colli were taken down bilaterally and a Shadowline self-retaining retractor placed transversely in a cephalocaudal direction.  Diskectomy was carried out at 5-6 and 6-7 under  gross observation.  The operating microscope was then brought in and microdissection technique used to complete the diskectomy at each level.  At C5-6 there was a modest-sized central herniated disk.  The disk was removed without difficulty and posterior longitudinal ligament divided, both neural foramina carefully explored and found to be open.  At 6-7 there was a much larger disk extending slightly towards the left out toward the C7 neural foramen.  This was also removed without difficulty and the neural foramen carefully explored bilaterally and found to be open. The wound was irrigated and hemostasis assured.  7 mm bone grafts were fashioned from patellar allograft and tapped into place.  A 41 mm tether plate was then placed with 13 mm screws, two in C5 and one in C6 and two in C7. Intraoperative x-ray showed good placement of bone grafts, plate and screws. The wound was irrigated.  Hemostasis assured.  Because of the large neck, a 7 mm Jackson-Pratt drain was placed in the anterior cervical space and exited by separate incision to the left of the main incision.  The platysma was reapproximated with 3-0 Vicryl in interrupted fashion.  The subcutaneous tissue was reapproximated with 3-0 Vicryl in interrupted fashion.  The skin was closed with 4-0 nylon in simple running fashion.  A bacitracin Telfa dressing was applied.  The patient was placed in an Aspen collar and returned to the recovery room in good condition. Dictated by:   Payton Doughty, M.D. Attending Physician:  Emeterio Reeve DD:  04/11/01 TD:  04/12/01 Job: [id number]

## 2010-06-12 NOTE — Assessment & Plan Note (Signed)
[facility]                                 ON-CALL NOTE   FOUAD, TAUL                         MRN:          [id number]  DATE:03/05/2007                            DOB:          08-13-1960    TELEPHONE TRIAGE NOTE:  Time:  2:57 p.m.  Caller:  Ferd Hibbs  Phone.  [phone]   He sees Dr. Milinda Antis.   Patient has just had his 49-year-old son at his pediatrician's office.  He was diagnosed with influenza.  They were told by their son's doctor  to contact their family doctor to see if they should be put on some  medication.  Neither of the parents have any symptoms at all and have no  other health problems.  My advice at this point is to simply watch and  see.  If he begins to show signs of influenza, he can contact Dr. Milinda Antis  for her advice.     Tera Mater. Clent Ridges, MD  Electronically Signed    SAF/MedQ  DD: 03/05/2007  DT: 03/06/2007  Job #: [id number]

## 2010-06-18 ENCOUNTER — Ambulatory Visit: Payer: 59 | Admitting: Cardiovascular Disease

## 2010-06-24 ENCOUNTER — Telehealth: Payer: Self-pay | Admitting: *Deleted

## 2010-06-24 NOTE — Telephone Encounter (Signed)
PT AWARE OF MONITOR RESULTS  SR  WITH OCC PVC NO AFIB  PER DR NISHAN./CY

## 2010-06-25 ENCOUNTER — Ambulatory Visit: Payer: 59 | Admitting: Cardiovascular Disease

## 2010-07-09 ENCOUNTER — Other Ambulatory Visit: Payer: Self-pay | Admitting: *Deleted

## 2010-07-09 MED ORDER — CARVEDILOL 25 MG PO TABS: 25.0000 mg | ORAL_TABLET | Freq: Two times a day (BID) | ORAL | Status: DC

## 2010-07-09 MED ORDER — LOSARTAN POTASSIUM-HCTZ 100-12.5 MG PO TABS: 1.0000 | ORAL_TABLET | Freq: Every day | ORAL | Status: DC

## 2010-07-09 MED ORDER — DIGOXIN 250 MCG PO TABS: 250.0000 ug | ORAL_TABLET | Freq: Every day | ORAL | Status: DC

## 2010-09-24 ENCOUNTER — Encounter: Payer: Self-pay | Admitting: Cardiovascular Disease

## 2010-09-24 ENCOUNTER — Ambulatory Visit (INDEPENDENT_AMBULATORY_CARE_PROVIDER_SITE_OTHER): Payer: 59 | Admitting: Cardiovascular Disease

## 2010-09-24 DIAGNOSIS — I4891 Unspecified atrial fibrillation: Secondary | ICD-10-CM

## 2010-09-24 DIAGNOSIS — I10 Essential (primary) hypertension: Secondary | ICD-10-CM

## 2010-09-24 DIAGNOSIS — R002 Palpitations: Secondary | ICD-10-CM

## 2010-09-24 DIAGNOSIS — I43 Cardiomyopathy in diseases classified elsewhere: Secondary | ICD-10-CM

## 2010-09-24 NOTE — Assessment & Plan Note (Signed)
Well controlled.  Continue current medications and low sodium Dash type diet.    

## 2010-09-24 NOTE — Assessment & Plan Note (Signed)
Maint NSR. No coumadin  Continue beta blocker

## 2010-09-24 NOTE — Patient Instructions (Signed)
Your physician wants you to follow-up in:  6 months. You will receive a reminder letter in the mail two months in advance. If you don't receive a letter, please call our office to schedule the follow-up appointment.   

## 2010-09-24 NOTE — Assessment & Plan Note (Signed)
Stable and euvolemic  May need higher dose of beta blocker in future.  He is a big man and HR;s seem a little high

## 2010-09-24 NOTE — Progress Notes (Signed)
History of Present Illness:  Aaron Thompson is a 50 y.o. male with a h/o NICM with EF 30% and paroxysmal atrial fibrillation s/p DCCV in 6/11. He was previously on Pradaxa. This was stopped in 2/12. His CHADS2 score is 1 with HTN. He presented to the ED  on 04/15/10  with palpitations. No labs were drawn. His ECG is reviewed and demonstrated NSR, HR 80, PRWP, IVCD, NSSTTW changes (no change when compared to prior tracings). CXR was non-acute.  He F/U with Tereso Newcomer our PA who ordered an event monitor.  I reviewed this 4/25-5/19 and no PAF only PAC;s and PVC;s  Baseline LBBB.    In retrospect he thinks his palpitations may have been an allergic reaction to fish oil as he feels better after stopping them   He denies chest pain. No orthopnea or PND. No significant edema. No weight gain. He is NYHA class 1 (he can walk up 65 steps at the [facility] without stopping). He does note a lot of stress with his job at the [employer]. He is not interested in taking anxiolytics.   ROS: Denies fever, malais, weight loss, blurry vision, decreased visual acuity, cough, sputum, SOB, hemoptysis, pleuritic pain, palpitaitons, heartburn, abdominal pain, melena, lower extremity edema, claudication, or rash.  All other systems reviewed and negative  General: Affect appropriate Healthy:  appears stated age HEENT: normal Neck supple with no adenopathy JVP normal no bruits no thyromegaly Lungs clear with no wheezing and good diaphragmatic motion Heart:  S1/S2 no murmur,rub, gallop or click PMI normal Abdomen: benighn, BS positve, no tenderness, no AAA no bruit.  No HSM or HJR Distal pulses intact with no bruits No edema Neuro non-focal Skin warm and dry No muscular weakness   Current Outpatient Prescriptions  Medication Sig Dispense Refill  . albuterol (VENTOLIN HFA) 108 (90 BASE) MCG/ACT inhaler Inhale 2 puffs into the lungs every 6 (six) hours as needed.        [name] aspirin 81 MG EC tablet Take 81 mg by mouth  daily.        . carvedilol (COREG) 25 MG tablet Take 1 tablet (25 mg total) by mouth 2 (two) times daily with a meal.  60 tablet  6  . digoxin (LANOXIN) 0.25 MG tablet Take 1 tablet (250 mcg total) by mouth daily.  30 tablet  6  . guaiFENesin (MUCINEX) 600 MG 12 hr tablet Take 1,200 mg by mouth as needed.        [name] losartan-hydrochlorothiazide (HYZAAR) 100-12.5 MG per tablet Take 1 tablet by mouth daily.  30 tablet  6  . Multiple Vitamin (MULTIVITAMIN) tablet Take 1 tablet by mouth daily.        Maxwell Caul Bicarbonate (ZEGERID OTC) 20-1100 MG CAPS Take 1 capsule by mouth as directed.          Allergies  Review of patient's allergies indicates no known allergies.  Electrocardiogram:  Assessment and Plan

## 2010-09-24 NOTE — Assessment & Plan Note (Signed)
Event monitor shows PAC and PVC question reaction to fish oil  Resolved  Contineu beta blocker

## 2010-10-05 ENCOUNTER — Emergency Department (HOSPITAL_COMMUNITY)
Admission: EM | Admit: 2010-10-05 | Discharge: 2010-10-06 | Disposition: A | Discharge status: Home / Self Care | Payer: 59 | Attending: Emergency Medicine | Admitting: Emergency Medicine

## 2010-10-05 DIAGNOSIS — R7309 Other abnormal glucose: Secondary | ICD-10-CM | POA: Insufficient documentation

## 2010-10-05 DIAGNOSIS — K219 Gastro-esophageal reflux disease without esophagitis: Secondary | ICD-10-CM | POA: Insufficient documentation

## 2010-10-05 DIAGNOSIS — R109 Unspecified abdominal pain: Secondary | ICD-10-CM | POA: Insufficient documentation

## 2010-10-05 DIAGNOSIS — I10 Essential (primary) hypertension: Secondary | ICD-10-CM | POA: Insufficient documentation

## 2010-10-05 LAB — URINE MICROSCOPIC-ADD ON

## 2010-10-05 LAB — URINALYSIS, ROUTINE W REFLEX MICROSCOPIC
Ketones, ur: 15 mg/dL — AB
Leukocytes, UA: NEGATIVE
Nitrite: NEGATIVE
Specific Gravity, Urine: 1.043 — ABNORMAL HIGH (ref 1.005–1.030)
pH: 6.5 (ref 5.0–8.0)

## 2010-10-05 LAB — DIFFERENTIAL
Eosinophils Absolute: 0.2 10*3/uL (ref 0.0–0.7)
Eosinophils Relative: 2 % (ref 0–5)
Lymphs Abs: 1.9 10*3/uL (ref 0.7–4.0)

## 2010-10-05 LAB — CBC
MCV: 84.3 fL (ref 78.0–100.0)
Platelets: 150 10*3/uL (ref 150–400)
RDW: 13.5 % (ref 11.5–15.5)
WBC: 8.3 10*3/uL (ref 4.0–10.5)

## 2010-10-06 ENCOUNTER — Emergency Department (HOSPITAL_COMMUNITY): Payer: 59

## 2010-10-06 ENCOUNTER — Encounter: Payer: Self-pay | Admitting: Family Medicine

## 2010-10-06 ENCOUNTER — Ambulatory Visit (INDEPENDENT_AMBULATORY_CARE_PROVIDER_SITE_OTHER): Payer: 59 | Admitting: Family Medicine

## 2010-10-06 VITALS — BP 136/88 | HR 74 | Temp 97.7°F | Wt 301.0 lb

## 2010-10-06 DIAGNOSIS — R7309 Other abnormal glucose: Secondary | ICD-10-CM

## 2010-10-06 DIAGNOSIS — R739 Hyperglycemia, unspecified: Secondary | ICD-10-CM

## 2010-10-06 DIAGNOSIS — E119 Type 2 diabetes mellitus without complications: Secondary | ICD-10-CM | POA: Insufficient documentation

## 2010-10-06 LAB — COMPREHENSIVE METABOLIC PANEL
AST: 23 U/L (ref 0–37)
Albumin: 3.9 g/dL (ref 3.5–5.2)
Calcium: 9.3 mg/dL (ref 8.4–10.5)
Chloride: 99 mEq/L (ref 96–112)
Creatinine, Ser: 0.69 mg/dL (ref 0.50–1.35)
Total Protein: 6.9 g/dL (ref 6.0–8.3)

## 2010-10-06 LAB — POCT CBG (FASTING - GLUCOSE)-MANUAL ENTRY: Glucose Fasting, POC: 184 mg/dL

## 2010-10-06 LAB — HEMOGLOBIN A1C: Hgb A1c MFr Bld: 9.5 % — ABNORMAL HIGH (ref 4.6–6.5)

## 2010-10-06 NOTE — Assessment & Plan Note (Signed)
New dx, d/w pt about dx and briefly about DM2 path/phys.  Advised pt to drink plenty of water, avoid sweets.  Check A1c today, out of work until f/u here in the clinic.  Will need f/u to go over A1c, will likely need a meter at that point and referral for DM2 education.  I didn't start new meds today.  He appears okay for outpatient f/u.

## 2010-10-06 NOTE — Progress Notes (Signed)
He went to ER.  Likely kidney stone, probably passed by the time of the CT scan at ER. Er recs reviewed.  H/o NASH and now with glucose 297 at ER. He last ate at 8pm yesterday, ice cream and candy bar.   The L abd/flank pain is better now.  He's here for f/u re: the sugar.  He hasn't need the pain meds from the ER yet.  No fevers.    He had recent blurry vision, now resolved, with polydipsia and polyphagia.   PMH and SH reviewed.  H/o AF and CHF per cards.  FH of DM2  ROS: See HPI, otherwise noncontributory.  Meds, vitals, and allergies reviewed.   nad ncat rrr ctab Abd soft, +BS

## 2010-10-06 NOTE — Patient Instructions (Addendum)
Drink plenty of water, avoid sweets and white foods (potatoes, rice, bread, pasta).  Come back for a visit on Friday (not with Para March as I will be out).  Out of work in the meantime.  Check the American Diabetes Association website in the meantime.  Take care.

## 2010-10-09 ENCOUNTER — Encounter: Payer: Self-pay | Admitting: Family Medicine

## 2010-10-09 ENCOUNTER — Ambulatory Visit (INDEPENDENT_AMBULATORY_CARE_PROVIDER_SITE_OTHER): Payer: 59 | Admitting: Family Medicine

## 2010-10-09 VITALS — BP 116/70 | HR 76 | Temp 98.2°F | Wt 298.2 lb

## 2010-10-09 DIAGNOSIS — E119 Type 2 diabetes mellitus without complications: Secondary | ICD-10-CM

## 2010-10-09 LAB — TSH: TSH: 0.48 u[IU]/mL (ref 0.35–5.50)

## 2010-10-09 LAB — MICROALBUMIN / CREATININE URINE RATIO: Microalb Creat Ratio: 1.8 mg/g (ref 0.0–30.0)

## 2010-10-09 MED ORDER — GLUCOSE BLOOD VI STRP: ORAL_STRIP | Status: DC

## 2010-10-09 MED ORDER — ONETOUCH ULTRA SYSTEM W/DEVICE KIT: 1.0000 | PACK | Freq: Once | Status: DC

## 2010-10-09 MED ORDER — ONETOUCH ULTRASOFT LANCETS MISC: Status: DC

## 2010-10-09 MED ORDER — METFORMIN HCL 500 MG PO TABS: 500.0000 mg | ORAL_TABLET | Freq: Two times a day (BID) | ORAL | Status: DC

## 2010-10-09 NOTE — Progress Notes (Signed)
  Subjective:    Patient ID: Aaron Thompson, male    DOB: 03-Sep-1960, 50 y.o.   MRN: [id number]  HPI CC: new DM f/u  H/o HTN, afib and CHF followed by Dr. Eden Emms.  Saw Dr. Para March Monday, told sugar was too high so had A1c checked and was elevated. Lab Results  Component Value Date   HGBA1C 9.5* 10/06/2010  did go to ADA website and has read up on diabetes.  Somewhat familiar with disease as has fmhx (brothers and mother).  Has tried to change diet around.  Lost 3 lbs in last few days.  Does walk for exercise.  No CP/tightness, SOB, paresthesias.  Lab Results  Component Value Date   CREATININE 0.69 10/05/2010   Wt Readings from Last 3 Encounters:  10/09/10 298 lb 4 oz (135.285 kg)  10/06/10 301 lb (136.533 kg)  09/24/10 298 lb (135.172 kg)   Review of Systems Per HPI    Objective:   Physical Exam  Nursing note and vitals reviewed. Constitutional: He appears well-developed and well-nourished. No distress.  HENT:  Head: Normocephalic and atraumatic.  Mouth/Throat: Oropharynx is clear and moist. No oropharyngeal exudate.  Eyes: Conjunctivae and EOM are normal. Pupils are equal, round, and reactive to light. No scleral icterus.  Neck: Normal range of motion. Neck supple. Carotid bruit is not present.  Cardiovascular: Normal rate, regular rhythm, normal heart sounds and intact distal pulses.   No murmur heard. Pulses:      Radial pulses are 2+ on the right side, and 2+ on the left side.       Dorsalis pedis pulses are 2+ on the right side, and 2+ on the left side.       Posterior tibial pulses are 2+ on the right side, and 2+ on the left side.  Pulmonary/Chest: Effort normal and breath sounds normal. No respiratory distress. He has no wheezes. He has no rales.  Musculoskeletal: He exhibits no edema.       Diabetic foot exam: Normal inspection No skin breakdown No calluses  Normal DP/PT pulses Normal sensation to light tough and monofilament Nails normal  Lymphadenopathy:    He has no cervical adenopathy.  Skin: Skin is warm and dry. No rash noted.  Psychiatric: He has a normal mood and affect.          Assessment & Plan:  CC:

## 2010-10-09 NOTE — Patient Instructions (Addendum)
Good job with diet changes.  Keep reading up on diabetes.  Handout provided. Check sugars occaionally for next few weeks then 1 week prior to next appointment keep log of sugars checked twice daily (fasting and 2 hours after a meal). With diabetes we want to keep eye on feet, and you should have yearly vision screens (call to make appointment). I would like to start you on medicine called metformin - start with 500mg  nightly, if doing ok with this may go up to 500mg  twice daily with meals.  May cause some stomach upset initially. I'd like to set you up with diabetes education classes.  Pass by Marion's office to schedule appointment. Return in 1 month with PCP for follow up diabetes.

## 2010-10-09 NOTE — Assessment & Plan Note (Signed)
New diagnosis.  See pt instructions. Sent in glucometer.  Set up with diabetes education. Start metformin, titrate up to bid dosing.  Discussed common side effects. Discussed disease, questions encouraged and answered. rtc 1 mo with PCP or Dr. Algis Downs or myself for f/u. TSH and microalb today.

## 2010-10-12 ENCOUNTER — Encounter: Payer: 59 | Attending: Family Medicine | Admitting: *Deleted

## 2010-10-12 ENCOUNTER — Encounter: Payer: Self-pay | Admitting: *Deleted

## 2010-10-12 DIAGNOSIS — Z713 Dietary counseling and surveillance: Secondary | ICD-10-CM | POA: Insufficient documentation

## 2010-10-12 DIAGNOSIS — E119 Type 2 diabetes mellitus without complications: Secondary | ICD-10-CM | POA: Insufficient documentation

## 2010-10-12 NOTE — Progress Notes (Signed)
  Patient was seen on 10/12/2010 for the first of a series of three diabetes self-management courses at the [facility]. The following learning objectives were met by the patient during this course:   Defines diabetes and the role of insulin  Identifies type of diabetes and pathophysiology  States normal BG range and personal goals  Identifies three risk factors for the development of diabetes  States the need for and frequency of healthcare follow up (ADA Standards of Care)  Handouts given during class include:  [facility] Core Class 1 Handout  ADA: What's My Number (A1c handout)  [facility] ADA Standards of Care Handout  [facility] Medication Log handout  Most recent A1c = 9.5  Lab Results  Component Value Date   HGBA1C 9.5* 10/06/2010    Patient has established the following initial goals:  Increase exercise  Keep MD appointments  Follow an appropriate meal plan  Follow-Up Plan: Pt plans to call back for additional diabetes education once he finds out what his insurance will pay for.

## 2010-10-12 NOTE — Patient Instructions (Signed)
Patient will attend Core Diabetes Courses as scheduled or follow up prn.  

## 2010-10-14 ENCOUNTER — Telehealth: Payer: Self-pay | Admitting: Family Medicine

## 2010-10-14 NOTE — Telephone Encounter (Signed)
Pt states he saw Dr. Para March last week and he wrote him a note to be out of work until 9/14 and patient misplaced the note and is requesting another note be faxed to him at [phone].

## 2010-10-14 NOTE — Telephone Encounter (Signed)
Faxed note as requested

## 2010-11-13 ENCOUNTER — Encounter: Payer: Self-pay | Admitting: Family Medicine

## 2010-11-13 ENCOUNTER — Ambulatory Visit: Payer: 59 | Admitting: Family Medicine

## 2010-11-13 ENCOUNTER — Ambulatory Visit (INDEPENDENT_AMBULATORY_CARE_PROVIDER_SITE_OTHER): Payer: 59 | Admitting: Family Medicine

## 2010-11-13 VITALS — BP 108/76 | HR 91 | Temp 98.4°F | Wt 290.0 lb

## 2010-11-13 DIAGNOSIS — E119 Type 2 diabetes mellitus without complications: Secondary | ICD-10-CM

## 2010-11-13 NOTE — Progress Notes (Signed)
Diabetes: he went to the first DM2 education class.   On diabetic diet.  No white bread, eating a meat and two vegetables with a meal.  Down 11 lbs in meantime.  Using medications without difficulties:yes Hypoglycemic episodes:no Hyperglycemic episodes:no Feet problems:no Blood Sugars averaging: 120-130 in AM, 120-160s in evening On ARB Walking at work.   He's been reading up on DM2 and talking with family about sugar control.    Meds, vitals, and allergies reviewed.   ROS: See HPI.  Otherwise negative.    GEN: nad, alert and oriented HEENT: mucous membranes moist NECK: supple w/o LA CV: rrr. PULM: ctab, no inc wob ABD: soft, +bs EXT: no edema SKIN: no acute rash  Diabetic foot exam: Normal inspection No skin breakdown No calluses  Normal DP pulses Normal sensation to light touch and monofilament Nails normal

## 2010-11-13 NOTE — Patient Instructions (Addendum)
Schedule a 30 min follow up appointment in 2 months with a lab visit a few days ahead of time.  You don't need to fast for labs.   Thank you for your effort.  Please keep going with your diet and exercise.  Glad to see you today.

## 2010-11-15 ENCOUNTER — Encounter: Payer: Self-pay | Admitting: Family Medicine

## 2010-11-15 NOTE — Assessment & Plan Note (Signed)
>  25 min spent with face to face with patient, >50% counseling KG:MWNU.  He's compliant with meds and behavior changes.  Continue to work on exercise, more walking.  Recheck A1c as planned and then f/u after that.  Try to continue weight loss.  He agrees, understands. Path/phys of DM2 d/w pt again.

## 2011-01-11 ENCOUNTER — Other Ambulatory Visit (INDEPENDENT_AMBULATORY_CARE_PROVIDER_SITE_OTHER): Payer: 59

## 2011-01-11 DIAGNOSIS — E119 Type 2 diabetes mellitus without complications: Secondary | ICD-10-CM

## 2011-01-14 ENCOUNTER — Ambulatory Visit (INDEPENDENT_AMBULATORY_CARE_PROVIDER_SITE_OTHER): Payer: 59 | Admitting: Family Medicine

## 2011-01-14 ENCOUNTER — Encounter: Payer: Self-pay | Admitting: Family Medicine

## 2011-01-14 VITALS — BP 110/80 | Temp 97.8°F | Wt 277.0 lb

## 2011-01-14 DIAGNOSIS — E119 Type 2 diabetes mellitus without complications: Secondary | ICD-10-CM

## 2011-01-14 DIAGNOSIS — I10 Essential (primary) hypertension: Secondary | ICD-10-CM

## 2011-01-14 MED ORDER — LOSARTAN POTASSIUM-HCTZ 100-12.5 MG PO TABS: 0.5000 | ORAL_TABLET | Freq: Every day | ORAL | Status: DC

## 2011-01-14 NOTE — Progress Notes (Signed)
Diabetes:  Using medications without difficulties: cut down to 1 tab of metformin since last OV Hypoglycemic episodes: no Hyperglycemic episodes:no Feet problems:no Blood Sugars averaging: ~110-120.  Down 13 lbs since last OV.  We agreed to d/c the metformin.  A1c 6.1. He's cut out carbs (and salt for his BP).  His wife is working to help with the diet.   Walking about 1 mile in addition to work.    We need to cut BP meds. He is having mild orthostatic sx.  We talked about this today.   PMH and SH reviewed  Meds, vitals, and allergies reviewed.   ROS: See HPI.  Otherwise negative.    GEN: nad, alert and oriented HEENT: mucous membranes moist NECK: supple w/o LA CV: rrr. I don't hear irregularity today on exam PULM: ctab, no inc wob ABD: soft, +bs EXT: no edema SKIN: no acute rash  Diabetic foot exam: Normal inspection No skin breakdown No calluses  Normal DP pulses Normal sensation to light touch and monofilament Nails normal

## 2011-01-14 NOTE — Patient Instructions (Signed)
Change the losartan HCTZ dose- take 1/2 tab a day.  Stop the metformin.  Recheck A1c and lipids in March 2013 before OV. Take care.  Glad to see you. Keep working on your diet and keep exercising.

## 2011-01-15 ENCOUNTER — Encounter: Payer: Self-pay | Admitting: Family Medicine

## 2011-01-15 NOTE — Assessment & Plan Note (Signed)
Cut ARB dose in half and if persistently having orthostatic sx, call back and we'll address.

## 2011-01-15 NOTE — Assessment & Plan Note (Signed)
>  25 min spent with face to face with patient, >50% counseling and/or coordinating care.  Controlled, stop metformin, continue weight loss, diet.  Diet and exercise d/w pt.  Improved.

## 2011-02-03 ENCOUNTER — Other Ambulatory Visit: Payer: Self-pay | Admitting: Cardiovascular Disease

## 2011-02-03 MED ORDER — DIGOXIN 250 MCG PO TABS: 250.0000 ug | ORAL_TABLET | Freq: Every day | ORAL | Status: DC

## 2011-02-08 ENCOUNTER — Telehealth: Payer: Self-pay | Admitting: Internal Medicine

## 2011-02-08 ENCOUNTER — Ambulatory Visit (INDEPENDENT_AMBULATORY_CARE_PROVIDER_SITE_OTHER): Payer: 59 | Admitting: Family Medicine

## 2011-02-08 ENCOUNTER — Encounter: Payer: Self-pay | Admitting: Family Medicine

## 2011-02-08 VITALS — BP 124/78 | HR 80 | Temp 98.0°F | Wt 276.0 lb

## 2011-02-08 DIAGNOSIS — K5289 Other specified noninfective gastroenteritis and colitis: Secondary | ICD-10-CM

## 2011-02-08 DIAGNOSIS — K529 Noninfective gastroenteritis and colitis, unspecified: Secondary | ICD-10-CM | POA: Insufficient documentation

## 2011-02-08 MED ORDER — PROMETHAZINE HCL 25 MG PO TABS: 25.0000 mg | ORAL_TABLET | Freq: Three times a day (TID) | ORAL | Status: DC | PRN

## 2011-02-08 NOTE — Assessment & Plan Note (Addendum)
Anticipate viral gastroenteritis. Supportive care. Phenergan for nausea. Update [place] if not improving as expected. S/p appy as young adult.

## 2011-02-08 NOTE — Telephone Encounter (Signed)
When patient woke up this morning he felt dizzy and started vomiting and diaherra, no fever but he has the cold sweats, He has Afib but his pulse and heart isn't racing.  Blood sugar is normal.  He did come contact with someone with the flu yesterday.  Any suggestions.

## 2011-02-08 NOTE — Telephone Encounter (Signed)
Keep track of pulse If signs of dehydration- dry mouth/ racing heart/ dizzy -- go to ER for IV fluids If abdominal pain or fever over 101.5 update  Otherwise- when able start to take in small sips of fluids -- do not try to eat until no vomiting for 24 hours  Please keep me updated

## 2011-02-08 NOTE — Patient Instructions (Signed)
I think you have acute viral gastroenteritis (stomach flu). Wash hands as contagious. Push fluids and rest - use phenergan for nausea. If not improving, or fever >101, worsening pain, or worsening nausea/vomiting, diarrhea, please let us know. I expect improvement each day.  Viral Gastroenteritis Viral gastroenteritis is also known as stomach flu. This condition affects the stomach and intestinal tract. The illness typically lasts 3 to 8 days. Most people develop an immune response. This eventually gets rid of the virus. While this natural response develops, the virus can make you quite ill.  CAUSES  Diarrhea and vomiting are often caused by a virus. Medicines (antibiotics) that kill germs will not help unless there is also a germ (bacterial) infection. SYMPTOMS  The most common symptom is diarrhea. This can cause severe loss of fluids (dehydration) and body salt (electrolyte) imbalance. TREATMENT  Treatments for this illness are aimed at rehydration. Antidiarrheal medicines are not recommended. They do not decrease diarrhea volume and may be harmful. Usually, home treatment is all that is needed. The most serious cases involve vomiting so severely that you are not able to keep down fluids taken by mouth (orally). In these cases, intravenous (IV) fluids are needed. Vomiting with viral gastroenteritis is common, but it will usually go away with treatment. HOME CARE INSTRUCTIONS  Small amounts of fluids should be taken frequently. Large amounts at one time may not be tolerated. Plain water may be harmful in infants and the elderly. Oral rehydration solutions (ORS) are available at pharmacies and grocery stores. ORS replace water and important electrolytes in proper proportions. Sports drinks are not as effective as ORS and may be harmful due to sugars worsening diarrhea.  As a general guideline for children, replace any new fluid losses from diarrhea or vomiting with ORS as follows:   If your child  weighs 22 pounds or under (10 kg or less), give 60-120 mL (1/4 - 1/2 cup or 2 - 4 ounces) of ORS for each diarrheal stool or vomiting episode.   If your child weighs more than 22 pounds (more than 10 kgs), give 120-240 mL (1/2 - 1 cup or 4 - 8 ounces) of ORS for each diarrheal stool or vomiting episode.   In a child with vomiting, it may be helpful to give the above ORS replacement in 5 mL (1 teaspoon) amounts every 5 minutes, then increase as tolerated.   While correcting for dehydration, children should eat normally. However, foods high in sugar should be avoided because this may worsen diarrhea. Large amounts of carbonated soft drinks, juice, gelatin desserts, and other highly sugared drinks should be avoided.   After correction of dehydration, other liquids that are appealing to the child may be added. Children should drink small amounts of fluids frequently and fluids should be increased as tolerated.   Adults should eat normally while drinking more fluids than usual. Drink small amounts of fluids frequently and increase as tolerated. Drink enough water and fluids to keep your urine clear or pale yellow. Broths, weak decaffeinated tea, lemon-lime soft drinks (allowed to go flat), and ORS replace fluids and electrolytes.   Avoid:   Carbonated drinks.   Juice.   Extremely hot or cold fluids.   Caffeine drinks.   Fatty, greasy foods.   Alcohol.   Tobacco.   Too much intake of anything at one time.   Gelatin desserts.   Probiotics are active cultures of beneficial bacteria. They may lessen the amount and number of diarrheal stools in adults.  Probiotics can be found in yogurt with active cultures and in supplements.   Wash your hands well to avoid spreading bacteria and viruses.   Antidiarrheal medicines are not recommended for infants and children.   Only take over-the-counter or prescription medicines for pain, discomfort, or fever as directed by your caregiver. Do not give  aspirin to children.   For adults with dehydration, ask your caregiver if you should continue all prescribed and over-the-counter medicines.   If your caregiver has given you a follow-up appointment, it is very important to keep that appointment. Not keeping the appointment could result in a lasting (chronic) or permanent injury and disability. If there is any problem keeping the appointment, you must call to reschedule.  SEEK IMMEDIATE MEDICAL CARE IF:   You are unable to keep fluids down.   There is no urine output in 6 to 8 hours or there is only a small amount of very dark urine.   You develop shortness of breath.   There is blood in the vomit (may look like coffee grounds) or stool.   Belly (abdominal) pain develops, increases, or localizes.   There is persistent vomiting or diarrhea.   You have a fever.   Your baby is older than 3 months with a rectal temperature of 102 F (38.9 C) or higher.   Your baby is 48 months old or younger with a rectal temperature of 100.4 F (38 C) or higher.  MAKE SURE YOU:   Understand these instructions.   Will watch your condition.   Will get help right away if you are not doing well or get worse.  Document Released: 01/11/2005 Document Revised: 09/23/2010 Document Reviewed: 05/25/2006 [facility] Patient Information 2012 [place], [place].

## 2011-02-08 NOTE — Telephone Encounter (Signed)
Pt is here now seeing Dr Sharen Hones.

## 2011-02-08 NOTE — Progress Notes (Signed)
  Subjective:    Patient ID: Aaron Thompson, male    DOB: 11/26/1960, 51 y.o.   MRN: [id number]  HPI CC: flu?  1d h/o feeling fatigued.  Stomach upset, epigastric pain, nausea/vomiting x2 NBNB, diarrhea.  Has been drinking diet 7up and water.  Felt hot/cold.  Ate cracker this morning.  Took am meds this morning.  Mild sinus congestion.  "hit me like a ton of bricks".  No URTI sxs.  No coughing, HA.  No chest pain, tightness, SOB.  Some myalgia/body aches  Worked weekend, family and work contacts sick.  Wife sick at home.  + flu exposure yesterday.  H/o DM, very good control lately.  No lows.  cbg 150 this am.  Taken off meds. Lab Results  Component Value Date   HGBA1C 6.1 01/11/2011   Past Surgical History  Procedure Date  . Neck surgery     plate/fusion  . Bilateral knee arthroscopy   . Cardioversion 06/2009    Non ischemic cardiomyopathy - cath and echo  . Stress cardiolite 12/02/1998    Normal EF 52%  . Stress cardiolite 08/2005    Normal, EF 42%  . Cardiovascular stress test 2006    Neg per pt  . Upper gastrointestinal endoscopy 08/2006    GERD  . Abdominal US 01/2007    Fatty liver, no gallstones  . Doppler echocardiography 11/2009    Decreased EF of 35-40%  appy at age 24yo  Review of Systems Per HPI    Objective:   Physical Exam  Nursing note and vitals reviewed. Constitutional: He appears well-developed and well-nourished. No distress.  HENT:  Head: Normocephalic and atraumatic.  Mouth/Throat: Oropharynx is clear and moist. No oropharyngeal exudate.  Eyes: Conjunctivae and EOM are normal. Pupils are equal, round, and reactive to light. No scleral icterus.  Neck: Normal range of motion. Neck supple.  Cardiovascular: Normal rate, regular rhythm, normal heart sounds and intact distal pulses.   No murmur heard. Pulmonary/Chest: Effort normal and breath sounds normal. No respiratory distress. He has no wheezes. He has no rales.  Abdominal: Soft. Bowel sounds are  normal. He exhibits no distension and no mass. There is no hepatosplenomegaly. There is tenderness in the right lower quadrant and epigastric area. There is no rigidity, no rebound, no guarding, no CVA tenderness and negative Murphy's sign.  Lymphadenopathy:    He has no cervical adenopathy.  Skin: Skin is warm and dry. No rash noted.  Psychiatric: He has a normal mood and affect.       Assessment & Plan:

## 2011-02-09 ENCOUNTER — Encounter: Payer: Self-pay | Admitting: *Deleted

## 2011-02-09 ENCOUNTER — Telehealth: Payer: Self-pay | Admitting: Internal Medicine

## 2011-02-09 NOTE — Telephone Encounter (Signed)
Ok to do

## 2011-02-09 NOTE — Telephone Encounter (Signed)
OK to provide for this duration?

## 2011-02-09 NOTE — Telephone Encounter (Signed)
Patient would like a work note stating he will return to work on Friday 1.18.13.  He did say he is better but he wants to be completely healthy when he returns to work.

## 2011-02-09 NOTE — Telephone Encounter (Signed)
Letter written and message left notifying patient. Note up front for patient to pick up.

## 2011-02-09 NOTE — Telephone Encounter (Signed)
Patient called and stated he does need a work note stating he will return to work on 1.19.13. He did state he feels better today but doesn't want to return to work until he is completely well.

## 2011-02-22 ENCOUNTER — Other Ambulatory Visit: Payer: Self-pay | Admitting: Cardiovascular Disease

## 2011-02-22 MED ORDER — CARVEDILOL 25 MG PO TABS: 25.0000 mg | ORAL_TABLET | Freq: Two times a day (BID) | ORAL | Status: DC

## 2011-03-24 ENCOUNTER — Encounter: Payer: Self-pay | Admitting: Cardiovascular Disease

## 2011-03-24 ENCOUNTER — Ambulatory Visit (INDEPENDENT_AMBULATORY_CARE_PROVIDER_SITE_OTHER): Payer: 59 | Admitting: Cardiovascular Disease

## 2011-03-24 DIAGNOSIS — I43 Cardiomyopathy in diseases classified elsewhere: Secondary | ICD-10-CM

## 2011-03-24 DIAGNOSIS — I10 Essential (primary) hypertension: Secondary | ICD-10-CM

## 2011-03-24 DIAGNOSIS — I4891 Unspecified atrial fibrillation: Secondary | ICD-10-CM

## 2011-03-24 DIAGNOSIS — E119 Type 2 diabetes mellitus without complications: Secondary | ICD-10-CM

## 2011-03-24 NOTE — Progress Notes (Signed)
Patient ID: Aaron Thompson, male   DOB: 1960-04-29, 51 y.o.   MRN: [id number] Aaron Thompson is a 51 y.o. male with a h/o NICM with EF 30% and paroxysmal atrial fibrillation s/p DCCV in 6/11. He was previously on Pradaxa. This was stopped in 2/12. His CHADS2 score is 1 with HTN. He presented to the ED on 04/15/10 with palpitations. No labs were drawn. His ECG is reviewed and demonstrated NSR, HR 80, PRWP, IVCD, NSSTTW changes (no change when compared to prior tracings). CXR was non-acute. He F/U with Tereso Newcomer our PA who ordered an event monitor. I reviewed this 4/25-5/19 and no PAF only PAC;s and PVC;s Baseline LBBB.  In retrospect he thinks his palpitations may have been an allergic reaction to fish oil as he feels better after stopping them  He denies chest pain. No orthopnea or PND. No significant edema. No weight gain. He is NYHA class 1 (he can walk up 65 steps at the [facility] without stopping). He does note a lot of stress with his job at the [employer]. He is not interested in taking anxiolytics.  Recent Diagnosis of DM with A1c over 9  Much improved with low carb diet and weight loss  ROS: Denies fever, malais, weight loss, blurry vision, decreased visual acuity, cough, sputum, SOB, hemoptysis, pleuritic pain, palpitaitons, heartburn, abdominal pain, melena, lower extremity edema, claudication, or rash.  All other systems reviewed and negative  General: Affect appropriate Healthy:  appears stated age HEENT: normal Neck supple with no adenopathy JVP normal no bruits no thyromegaly Lungs clear with no wheezing and good diaphragmatic motion Heart:  S1/S2 no murmur, no rub, gallop or click PMI normal Abdomen: benighn, BS positve, no tenderness, no AAA no bruit.  No HSM or HJR Distal pulses intact with no bruits No edema Neuro non-focal Skin warm and dry No muscular weakness   Current Outpatient Prescriptions  Medication Sig Dispense Refill  . aspirin 325 MG tablet Take 325 mg by  mouth daily.        . Blood Glucose Monitoring Suppl (ONE TOUCH ULTRA SYSTEM KIT) W/DEVICE KIT 1 kit by Does not apply route once.  1 each  0  . carvedilol (COREG) 25 MG tablet Take 1 tablet (25 mg total) by mouth 2 (two) times daily with a meal.  60 tablet  8  . digoxin (LANOXIN) 0.25 MG tablet Take 1 tablet (250 mcg total) by mouth daily.  30 tablet  6  . DM-APAP-CPM (CORICIDIN HBP FLU PO) Take by mouth as needed.      [name] glucose blood test strip Use as instructed.  250.02  100 each  12  . Lancets (ONETOUCH ULTRASOFT) lancets Check sugars twice daily. 250.02  100 each  12  . losartan-hydrochlorothiazide (HYZAAR) 100-12.5 MG per tablet Take 0.5 tablets by mouth daily.  30 tablet  6  . Multiple Vitamin (MULTIVITAMIN) tablet Take 1 tablet by mouth daily.        Maxwell Caul Bicarbonate (ZEGERID OTC) 20-1100 MG CAPS Take 1 capsule by mouth as directed.          Allergies  Review of patient's allergies indicates no known allergies.  Electrocardiogram:  NSR LBBB chronic    Assessment and Plan

## 2011-03-24 NOTE — Patient Instructions (Signed)
Your physician wants you to follow-up in:  6 MONTHS WITH DR NISHAN  You will receive a reminder letter in the mail two months in advance. If you don't receive a letter, please call our office to schedule the follow-up appointment. Your physician recommends that you continue on your current medications as directed. Please refer to the Current Medication list given to you today. 

## 2011-03-24 NOTE — Assessment & Plan Note (Signed)
Off oral hypoglycemics.  Weight down 30 lbs Congratulated him and told him to stick with low carb diet and exercise

## 2011-03-24 NOTE — Assessment & Plan Note (Signed)
Maint NSR  Event monitor with no arrhythmia  ASA and beta blocker

## 2011-03-24 NOTE — Assessment & Plan Note (Signed)
Euvolemic Continue current meds.  F/U echo in a year

## 2011-03-24 NOTE — Assessment & Plan Note (Signed)
BP meds recently decreased as pressure improved with weight loss and diet changes

## 2011-04-30 ENCOUNTER — Telehealth: Payer: Self-pay | Admitting: Cardiovascular Disease

## 2011-04-30 MED ORDER — LOSARTAN POTASSIUM-HCTZ 100-12.5 MG PO TABS: 0.5000 | ORAL_TABLET | Freq: Every day | ORAL | Status: DC

## 2011-04-30 NOTE — Telephone Encounter (Signed)
[facility] told [facility] they faxed [place] three times with no response, pt now out and needs refill asap for Hyzaar @ [facility]

## 2011-08-16 ENCOUNTER — Telehealth: Payer: Self-pay

## 2011-08-16 NOTE — Telephone Encounter (Signed)
Patient notified as instructed by telephone. 

## 2011-08-16 NOTE — Telephone Encounter (Signed)
Starting 08/13/11 SOB and slight chest pressure upon exertion. If sitting no pressure feeling and no SOB. Now no pressure or SOB but pt sitting quietly pulse rate now 72. No irregular heart beat. Had H/A over weekend. Not voiding as ofter since Fri; pt takes Hyzaar 100-12.5 mg 1/2 tab daily. No extremity swelling. Has not taken BP 2 hr after eating blood sugar 161. 3 yrs ago had congestive heart failure. Pt request appt tomorrow with Dr Para March at 8:15.pm. Advised pt if condition changes or worsens to go to ER if needed.

## 2011-08-16 NOTE — Telephone Encounter (Signed)
If he doesn't have CP or SOB, then keep the OV tomorrow.  If CP or SOB, then to ER overnight.

## 2011-08-17 ENCOUNTER — Ambulatory Visit (INDEPENDENT_AMBULATORY_CARE_PROVIDER_SITE_OTHER): Payer: 59 | Admitting: Family Medicine

## 2011-08-17 ENCOUNTER — Emergency Department (HOSPITAL_COMMUNITY): Payer: 59

## 2011-08-17 ENCOUNTER — Inpatient Hospital Stay (HOSPITAL_COMMUNITY)
Admission: EM | Admit: 2011-08-17 | Discharge: 2011-08-19 | DRG: 287 | Disposition: A | Discharge status: Home / Self Care | Payer: 59 | Attending: Cardiology | Admitting: Cardiology

## 2011-08-17 ENCOUNTER — Encounter: Payer: Self-pay | Admitting: Family Medicine

## 2011-08-17 ENCOUNTER — Encounter (HOSPITAL_COMMUNITY): Payer: Self-pay | Admitting: *Deleted

## 2011-08-17 VITALS — BP 122/82 | HR 77 | Temp 98.0°F | Wt 284.0 lb

## 2011-08-17 DIAGNOSIS — Z7982 Long term (current) use of aspirin: Secondary | ICD-10-CM

## 2011-08-17 DIAGNOSIS — I472 Ventricular tachycardia, unspecified: Secondary | ICD-10-CM | POA: Diagnosis not present

## 2011-08-17 DIAGNOSIS — I200 Unstable angina: Secondary | ICD-10-CM | POA: Diagnosis present

## 2011-08-17 DIAGNOSIS — F411 Generalized anxiety disorder: Secondary | ICD-10-CM | POA: Diagnosis present

## 2011-08-17 DIAGNOSIS — Z860100 Personal history of colon polyps, unspecified: Secondary | ICD-10-CM

## 2011-08-17 DIAGNOSIS — R0602 Shortness of breath: Secondary | ICD-10-CM | POA: Diagnosis present

## 2011-08-17 DIAGNOSIS — K219 Gastro-esophageal reflux disease without esophagitis: Secondary | ICD-10-CM | POA: Diagnosis present

## 2011-08-17 DIAGNOSIS — K7689 Other specified diseases of liver: Secondary | ICD-10-CM | POA: Diagnosis present

## 2011-08-17 DIAGNOSIS — R079 Chest pain, unspecified: Secondary | ICD-10-CM

## 2011-08-17 DIAGNOSIS — I4891 Unspecified atrial fibrillation: Secondary | ICD-10-CM | POA: Diagnosis present

## 2011-08-17 DIAGNOSIS — M1990 Unspecified osteoarthritis, unspecified site: Secondary | ICD-10-CM | POA: Diagnosis present

## 2011-08-17 DIAGNOSIS — I10 Essential (primary) hypertension: Secondary | ICD-10-CM

## 2011-08-17 DIAGNOSIS — I4729 Other ventricular tachycardia: Secondary | ICD-10-CM | POA: Diagnosis not present

## 2011-08-17 DIAGNOSIS — H9190 Unspecified hearing loss, unspecified ear: Secondary | ICD-10-CM | POA: Diagnosis present

## 2011-08-17 DIAGNOSIS — K529 Noninfective gastroenteritis and colitis, unspecified: Secondary | ICD-10-CM

## 2011-08-17 DIAGNOSIS — I428 Other cardiomyopathies: Secondary | ICD-10-CM

## 2011-08-17 DIAGNOSIS — I2510 Atherosclerotic heart disease of native coronary artery without angina pectoris: Secondary | ICD-10-CM | POA: Diagnosis present

## 2011-08-17 DIAGNOSIS — E119 Type 2 diabetes mellitus without complications: Secondary | ICD-10-CM | POA: Diagnosis present

## 2011-08-17 DIAGNOSIS — I208 Other forms of angina pectoris: Secondary | ICD-10-CM

## 2011-08-17 LAB — CBC WITH DIFFERENTIAL/PLATELET
Basophils Absolute: 0 K/uL (ref 0.0–0.1)
Basophils Relative: 1 % (ref 0–1)
Eosinophils Absolute: 0.2 K/uL (ref 0.0–0.7)
Eosinophils Relative: 2 % (ref 0–5)
HCT: 44.6 % (ref 39.0–52.0)
Hemoglobin: 15.5 g/dL (ref 13.0–17.0)
Lymphocytes Relative: 20 % (ref 12–46)
Lymphs Abs: 1.5 K/uL (ref 0.7–4.0)
MCH: 29.6 pg (ref 26.0–34.0)
MCHC: 34.8 g/dL (ref 30.0–36.0)
MCV: 85.1 fL (ref 78.0–100.0)
Monocytes Absolute: 0.6 K/uL (ref 0.1–1.0)
Monocytes Relative: 7 % (ref 3–12)
Neutro Abs: 5.3 K/uL (ref 1.7–7.7)
Neutrophils Relative %: 70 % (ref 43–77)
Platelets: 164 K/uL (ref 150–400)
RBC: 5.24 MIL/uL (ref 4.22–5.81)
RDW: 13.5 % (ref 11.5–15.5)
WBC: 7.5 K/uL (ref 4.0–10.5)

## 2011-08-17 LAB — CARDIAC PANEL(CRET KIN+CKTOT+MB+TROPI)
Relative Index: 1.8 (ref 0.0–2.5)
Total CK: 178 U/L (ref 7–232)
Troponin I: <0.3 ng/mL (ref <0.30)

## 2011-08-17 LAB — HEPARIN LEVEL (UNFRACTIONATED): Heparin Unfractionated: 0.18 IU/mL — ABNORMAL LOW (ref 0.30–0.70)

## 2011-08-17 LAB — URINALYSIS, ROUTINE W REFLEX MICROSCOPIC
Bilirubin Urine: NEGATIVE
Glucose, UA: NEGATIVE mg/dL
Hgb urine dipstick: NEGATIVE
Ketones, ur: NEGATIVE mg/dL
Leukocytes, UA: NEGATIVE
Nitrite: NEGATIVE
Protein, ur: NEGATIVE mg/dL
Specific Gravity, Urine: 1.022 (ref 1.005–1.030)
Urobilinogen, UA: 0.2 mg/dL (ref 0.0–1.0)
pH: 7 (ref 5.0–8.0)

## 2011-08-17 LAB — BASIC METABOLIC PANEL WITH GFR
BUN: 22 mg/dL (ref 6–23)
CO2: 25 meq/L (ref 19–32)
Calcium: 9.2 mg/dL (ref 8.4–10.5)
Chloride: 102 meq/L (ref 96–112)
Creatinine, Ser: 0.64 mg/dL (ref 0.50–1.35)
GFR calc Af Amer: >90 mL/min (ref >90)
GFR calc non Af Amer: >90 mL/min (ref >90)
Glucose, Bld: 154 mg/dL — ABNORMAL HIGH (ref 70–99)
Potassium: 3.8 meq/L (ref 3.5–5.1)
Sodium: 139 meq/L (ref 135–145)

## 2011-08-17 LAB — POCT I-STAT TROPONIN I: Troponin i, poc: 0 ng/mL (ref 0.00–0.08)

## 2011-08-17 LAB — GLUCOSE, CAPILLARY: Glucose-Capillary: 133 mg/dL — ABNORMAL HIGH (ref 70–99)

## 2011-08-17 LAB — PRO B NATRIURETIC PEPTIDE: Pro B Natriuretic peptide (BNP): 36.3 pg/mL (ref 0–125)

## 2011-08-17 MED ORDER — ACETAMINOPHEN 325 MG PO TABS: 650.0000 mg | ORAL_TABLET | ORAL | Status: DC | PRN

## 2011-08-17 MED ORDER — ZOLPIDEM TARTRATE 5 MG PO TABS: 5.0000 mg | ORAL_TABLET | Freq: Every evening | ORAL | Status: DC | PRN

## 2011-08-17 MED ORDER — ONDANSETRON HCL 4 MG/2ML IJ SOLN: 4.0000 mg | Freq: Four times a day (QID) | INTRAMUSCULAR | Status: DC | PRN

## 2011-08-17 MED ORDER — DIGOXIN 250 MCG PO TABS
250.0000 ug | ORAL_TABLET | Freq: Every day | ORAL | Status: DC
  Administered 2011-08-18: 250 ug via ORAL
  Filled 2011-08-17 (×2): qty 1

## 2011-08-17 MED ORDER — GI COCKTAIL ~~LOC~~: 30.0000 mL | Freq: Once | ORAL | Status: DC

## 2011-08-17 MED ORDER — LOSARTAN POTASSIUM-HCTZ 100-12.5 MG PO TABS: 0.5000 | ORAL_TABLET | Freq: Every day | ORAL | Status: DC

## 2011-08-17 MED ORDER — HEPARIN (PORCINE) IN NACL 100-0.45 UNIT/ML-% IJ SOLN
2050.0000 [IU]/h | INTRAMUSCULAR | Status: DC
  Administered 2011-08-17: 2050 [IU]/h via INTRAVENOUS
  Administered 2011-08-17: 1600 [IU]/h via INTRAVENOUS
  Filled 2011-08-17 (×4): qty 250

## 2011-08-17 MED ORDER — NITROGLYCERIN 0.3 MG SL SUBL: 0.4000 mg | SUBLINGUAL_TABLET | SUBLINGUAL | Status: DC | PRN

## 2011-08-17 MED ORDER — ASPIRIN 81 MG PO CHEW
324.0000 mg | CHEWABLE_TABLET | ORAL | Status: AC
  Administered 2011-08-18: 324 mg via ORAL
  Filled 2011-08-17: qty 4

## 2011-08-17 MED ORDER — HEPARIN BOLUS VIA INFUSION
4000.0000 [IU] | Freq: Once | INTRAVENOUS | Status: AC
  Administered 2011-08-17: 4000 [IU] via INTRAVENOUS

## 2011-08-17 MED ORDER — SODIUM CHLORIDE 0.9 % IV SOLN
INTRAVENOUS | Status: DC
  Administered 2011-08-18: 04:00:00 via INTRAVENOUS

## 2011-08-17 MED ORDER — LOSARTAN POTASSIUM 50 MG PO TABS
50.0000 mg | ORAL_TABLET | Freq: Every day | ORAL | Status: DC
  Administered 2011-08-18: 50 mg via ORAL
  Filled 2011-08-17 (×2): qty 1

## 2011-08-17 MED ORDER — SODIUM CHLORIDE 0.9 % IV SOLN
250.0000 mL | INTRAVENOUS | Status: DC | PRN
  Administered 2011-08-17: 250 mL via INTRAVENOUS

## 2011-08-17 MED ORDER — NITROGLYCERIN 0.4 MG SL SUBL: 0.4000 mg | SUBLINGUAL_TABLET | SUBLINGUAL | Status: DC | PRN

## 2011-08-17 MED ORDER — ADULT MULTIVITAMIN W/MINERALS CH
1.0000 | ORAL_TABLET | Freq: Every day | ORAL | Status: DC
  Administered 2011-08-18: 1 via ORAL
  Filled 2011-08-17 (×2): qty 1

## 2011-08-17 MED ORDER — SODIUM CHLORIDE 0.9 % IJ SOLN: 3.0000 mL | INTRAMUSCULAR | Status: DC | PRN

## 2011-08-17 MED ORDER — ALPRAZOLAM 0.25 MG PO TABS: 0.2500 mg | ORAL_TABLET | Freq: Two times a day (BID) | ORAL | Status: DC | PRN

## 2011-08-17 MED ORDER — SODIUM CHLORIDE 0.9 % IJ SOLN: 3.0000 mL | Freq: Two times a day (BID) | INTRAMUSCULAR | Status: DC

## 2011-08-17 MED ORDER — NITROGLYCERIN IN D5W 200-5 MCG/ML-% IV SOLN: 3.0000 ug/min | INTRAVENOUS | Status: DC

## 2011-08-17 MED ORDER — HEPARIN BOLUS VIA INFUSION
3000.0000 [IU] | Freq: Once | INTRAVENOUS | Status: AC
  Administered 2011-08-17: 3000 [IU] via INTRAVENOUS
  Filled 2011-08-17: qty 3000

## 2011-08-17 MED ORDER — HYDROCHLOROTHIAZIDE 12.5 MG PO CAPS
12.5000 mg | ORAL_CAPSULE | Freq: Every day | ORAL | Status: DC
  Administered 2011-08-18: 12.5 mg via ORAL
  Filled 2011-08-17 (×2): qty 1

## 2011-08-17 MED ORDER — INSULIN ASPART 100 UNIT/ML ~~LOC~~ SOLN
0.0000 [IU] | Freq: Three times a day (TID) | SUBCUTANEOUS | Status: DC
  Administered 2011-08-18: 2 [IU] via SUBCUTANEOUS

## 2011-08-17 MED ORDER — CARVEDILOL 25 MG PO TABS
25.0000 mg | ORAL_TABLET | Freq: Two times a day (BID) | ORAL | Status: DC
  Administered 2011-08-18 – 2011-08-19 (×3): 25 mg via ORAL
  Filled 2011-08-17 (×6): qty 1

## 2011-08-17 MED ORDER — ONE-DAILY MULTI VITAMINS PO TABS: 1.0000 | ORAL_TABLET | Freq: Every day | ORAL | Status: DC

## 2011-08-17 MED ORDER — PANTOPRAZOLE SODIUM 40 MG PO TBEC: 40.0000 mg | DELAYED_RELEASE_TABLET | Freq: Every day | ORAL | Status: DC

## 2011-08-17 MED ORDER — SODIUM CHLORIDE 0.9 % IV SOLN: 250.0000 mL | INTRAVENOUS | Status: DC | PRN

## 2011-08-17 MED ORDER — SODIUM CHLORIDE 0.9 % IJ SOLN
3.0000 mL | Freq: Two times a day (BID) | INTRAMUSCULAR | Status: DC
  Administered 2011-08-18: 3 mL via INTRAVENOUS

## 2011-08-17 MED ORDER — ASPIRIN 81 MG PO CHEW: 81.0000 mg | CHEWABLE_TABLET | Freq: Every day | ORAL | Status: DC

## 2011-08-17 NOTE — Consult Note (Signed)
ANTICOAGULATION CONSULT NOTE - Initial Consult  Pharmacy Consult for Heparin Indication: chest pain/ACS  No Known Allergies  Patient Measurements: Height: 6\' 4"  (193 cm) Weight: 284 lb (128.822 kg) IBW/kg (Calculated) : 86.8  Heparin Dosing Weight: 114.6 kg  Vital Signs: Temp: 98.1 F (36.7 C) (07/23 1417) Temp src: Oral (07/23 1417) BP: 117/83 mmHg (07/23 1417) Pulse Rate: 64  (07/23 1417)  Labs:  Basename 08/17/11 1028  HGB 15.5  HCT 44.6  PLT 164  APTT --  LABPROT --  INR --  HEPARINUNFRC --  CREATININE 0.64  CKTOTAL --  CKMB --  TROPONINI --    Estimated Creatinine Clearance: 163.7 ml/min (by C-G formula based on Cr of 0.64).   Medical History: Past Medical History  Diagnosis Date  . Hypertension   . DJD (degenerative joint disease)   . Dermatophytosis of the body   . Calculus of kidney   . Esophageal reflux   . Other chronic nonalcoholic liver disease     fatty liver  . Unspecified hearing loss   . Anxiety state, unspecified   . Non-ischemic cardiomyopathy     a.  cath 6/11: OM2 30%; EF 30%;  b. echo 2/12: EF 30%, mild MR, mild LAE  . Atrial fibrillation     a. s/p DCCV 6/11; previously on Pradaxa;  b. Event Monitor 2012->No PAF  . LBBB (left bundle branch block)     intermittent  . Hx of colonic polyps   . Osteoarthrosis, unspecified whether generalized or localized, hand   . Diarrhea   . Gastroenteritis   . Abdominal pain, right upper quadrant   . Folliculitis   . Diabetes mellitus    Medications:  No anticoagulants pta  Assessment: 50yom with hx NICM and PAF presents to ED with unstable angina. CEs negative and no EKG changes. He is to begin IV heparin and plan for [facility] tomorrow. Baseline CBC and renal function wnl.  Goal of Therapy:  Heparin level 0.3-0.7 units/ml Monitor platelets by anticoagulation protocol: Yes   Plan:  1) Heparin bolus 4000 units x 1 2) Heparin at 1600 units/hr 3) 6 hour heparin level 4) Daily heparin level  and CBC  Terral, Cooks 08/17/2011,3:41 PM

## 2011-08-17 NOTE — ED Provider Notes (Signed)
History     CSN: [id number]  Arrival date & time 08/17/11  1008   First MD Initiated Contact with Patient 08/17/11 1126      Chief Complaint  Patient presents with  . Chest Pain    (Consider location/radiation/quality/duration/timing/severity/associated sxs/prior treatment) Patient is a 51 y.o. male presenting with chest pain. The history is provided by the patient. No language interpreter was used.  Chest Pain The chest pain began 3 - 5 days ago. Chest pain occurs intermittently. The chest pain is worsening. The pain is associated with exertion. At its most intense, the pain is at 8/10. The pain is currently at 0/10. The severity of the pain is severe. The quality of the pain is described as pressure-like. Pertinent negatives for primary symptoms include no fever, no fatigue, no syncope, no wheezing, no palpitations, no nausea, no vomiting and no dizziness.  Pertinent negatives for associated symptoms include no lower extremity edema. Risk factors include male gender.    51 year old male patient of Dr. Theodoro Clock  and Dr. Para March coming in today with intermittent chest pain especially with exertion. States that he has been  short of breath x4 days. States that his chest pain has been midsternal and left chest with no radiation. Patient went [place] [employer] this weekend and when he walked he did have some chest pain but did not tell anyone.States the chest pain went away when he sat down.   States that the pain went away last night and when he woke this morning his chest was "tight".  Patient has never had a heart attack. Patient went to urgent care initially and was given a nitroglycerin with relief. He then came to the ER by ambulance and received another nitroglycerin with complete pain relief. Past medical history of atrial fib, left bundle branch block, hypertension, diabetes, cardiac cath 3 years ago with 40% blockage, cardiomyopathy with 30% EF. Patient stopped taking pradaxa 1 year ago  per Dr. Eden Emms.    Past Medical History  Diagnosis Date  . Hypertension   . DJD (degenerative joint disease)   . Dermatophytosis of the body   . Calculus of kidney   . Esophageal reflux   . Other chronic nonalcoholic liver disease     fatty liver  . Unspecified hearing loss   . Anxiety state, unspecified   . Non-ischemic cardiomyopathy     a.  cath 6/11: OM2 30%; EF 30%;  b. echo 2/12: EF 30%, mild MR, mild LAE  . Atrial fibrillation     s/p DCCV 6/11; previously on Pradaxa  . LBBB (left bundle branch block)     intermittent  . Hx of colonic polyps   . Osteoarthrosis, unspecified whether generalized or localized, hand   . Diarrhea   . Gastroenteritis   . Abdominal pain, right upper quadrant   . Folliculitis   . Diabetes mellitus     Past Surgical History  Procedure Date  . Neck surgery     plate/fusion  . Bilateral knee arthroscopy   . Cardioversion 06/2009    Non ischemic cardiomyopathy - cath and echo  . Stress cardiolite 12/02/1998    Normal EF 52%  . Stress cardiolite 08/2005    Normal, EF 42%  . Cardiovascular stress test 2006    Neg per pt  . Upper gastrointestinal endoscopy 08/2006    GERD  . Abdominal US 01/2007    Fatty liver, no gallstones  . Doppler echocardiography 11/2009    Decreased EF of 35-40%  .  Appendectomy     Family History  Problem Relation Age of Onset  . Hypertension Mother   . Diabetes Mother   . Hypertension Father   . Diabetes Father   . Diabetes Brother   . Kidney disease Brother   . Diabetes Brother     History  Substance Use Topics  . Smoking status: Never Smoker   . Smokeless tobacco: Not on file  . Alcohol Use: Not on file      Review of Systems  Constitutional: Negative.  Negative for fever and fatigue.  HENT: Negative.   Eyes: Negative.   Respiratory: Negative.  Negative for wheezing.   Cardiovascular: Positive for chest pain. Negative for palpitations and syncope.  Gastrointestinal: Negative.  Negative for  nausea and vomiting.  Neurological: Negative.  Negative for dizziness.  Psychiatric/Behavioral: Negative.   All other systems reviewed and are negative.    Allergies  Review of patient's allergies indicates no known allergies.  Home Medications   Current Outpatient Rx  Name Route Sig Dispense Refill  . ASPIRIN 325 MG PO TABS Oral Take 325 mg by mouth daily.      . ASPIRIN 81 MG PO CHEW Oral Chew 81 mg by mouth daily.    [name] CARVEDILOL 25 MG PO TABS Oral Take 1 tablet (25 mg total) by mouth 2 (two) times daily with a meal. 60 tablet 8  . DIGOXIN 0.25 MG PO TABS Oral Take 1 tablet (250 mcg total) by mouth daily. 30 tablet 6  . LOSARTAN POTASSIUM-HCTZ 100-12.5 MG PO TABS Oral Take 0.5 tablets by mouth daily. 30 tablet 6  . ONE-DAILY MULTI VITAMINS PO TABS Oral Take 1 tablet by mouth daily.      [name] OMEPRAZOLE-SODIUM BICARBONATE 20-1100 MG PO CAPS Oral Take 1 capsule by mouth daily.     Letta Pate ULTRA SYSTEM W/DEVICE KIT Does not apply 1 kit by Does not apply route once. 1 each 0  . CORICIDIN HBP FLU PO Oral Take by mouth as needed.    [name] GLUCOSE BLOOD VI STRP  Use as instructed.  250.02 100 each 12  . ONETOUCH ULTRASOFT LANCETS MISC  Check sugars twice daily. 250.02 100 each 12    BP 117/74  Pulse 70  Temp 98 F (36.7 C) (Oral)  Resp 15  SpO2 97%  Physical Exam  Nursing note and vitals reviewed. Constitutional: He is oriented to person, place, and time. He appears well-developed and well-nourished.  HENT:  Head: Normocephalic.  Eyes: Conjunctivae and EOM are normal. Pupils are equal, round, and reactive to light.  Neck: Normal range of motion. Neck supple.  Cardiovascular: Normal rate.   Pulmonary/Chest: Effort normal.       Pain free presently  Abdominal: Soft. Bowel sounds are normal.  Musculoskeletal: Normal range of motion. He exhibits no edema.  Neurological: He is alert and oriented to person, place, and time. No cranial nerve deficit.  Skin: Skin is warm and dry.    Psychiatric: He has a normal mood and affect.    ED Course  Procedures (including critical care time)  Labs Reviewed  BASIC METABOLIC PANEL - Abnormal; Notable for the following:    Glucose, Bld 154 (*)     All other components within normal limits  CBC WITH DIFFERENTIAL  URINALYSIS, ROUTINE W REFLEX MICROSCOPIC  PRO B NATRIURETIC PEPTIDE  POCT I-STAT TROPONIN I   Dg Chest Port 1 View  08/17/2011  *RADIOLOGY REPORT*  Clinical Data: Chest pain.  PORTABLE CHEST -  1 VIEW  Comparison: Chest 04/15/2010.  Findings: Heart size is normal.  Lungs are clear.  No pneumothorax or effusion.  IMPRESSION: No acute disease.  Original Report Authenticated By: Bernadene Bell. Maricela Curet, M.D.     No diagnosis found.    MDM   Date: 08/17/2011  Rate: 73   Rhythm: normal sinus rhythm  QRS Axis: normal  Intervals: normal  ST/T Wave abnormalities: normal  Conduction Disutrbances:none  Narrative Interpretation:   Old EKG Reviewed: unchanged  51 year old male patient of Dr. knee shunt coming in with exertional chest pain relieved by nitroglycerin x2 this morning. Patient will be seen and admitted by [facility]. Troponins are negative. EKG is unchanged. All other labs or fairly normal. Glucose is 154. Patient states last cath 3 years ago with a 40% blockage. Unable to find in records.          Remi Haggard, NP 08/17/11 1243

## 2011-08-17 NOTE — Progress Notes (Signed)
A few days of SOB and mild SSCP with exertion.  Was walking hills in the weekend and his exercise tolerance wasn't a good as normal.  His sx resolved with rest.  Noted sx again yesterday at work.  [place] sx [place]. Resolved with rest.  Has been coughing and with scant sputum production in AM and he didn't know if this was related to the pain.  He woke this AM with mild chest tightness but no pain.  He still has some chest tightness but no pain. Able to sleep with 1 pillow.  No BLE edema.  No FCNAV.  No NTG use.  Sugar has been controlled, 110-120 in AM.  Off DM2 meds.    H/o CHF and cardioversion.    PMH and SH reviewed  ROS: See HPI, otherwise noncontributory.  Meds, vitals, and allergies reviewed.   GEN: nad, alert and oriented HEENT: mucous membranes moist NECK: supple w/o LA CV: rrr PULM: ctab, no inc wob, no crackles or focal dec in BS ABD: soft, +bs EXT: no edema SKIN: no acute rash  EKG w/o sig changes.   GI cocktail given.  No change in sx. NTG given and chest tightness resolved in a few seconds.  Minimal HA after NTG use.

## 2011-08-17 NOTE — ED Notes (Signed)
Family at bedside. 

## 2011-08-17 NOTE — H&P (Signed)
Patient ID: ANAV LAMMERT MRN: [id number], DOB/AGE: 50-18-1962   Admit date: 08/17/2011   Primary Physician: Roxy Manns, MD Primary Cardiologist: P. Eden Emms, MD  Pt. Profile:  51 y/o male w/ prior h/o NICM who presents to ED w/ a 5 day h/o doe and ex chest pain.  Problem List  Past Medical History  Diagnosis Date  . Hypertension   . DJD (degenerative joint disease)   . Dermatophytosis of the body   . Calculus of kidney   . Esophageal reflux   . Other chronic nonalcoholic liver disease     fatty liver  . Unspecified hearing loss   . Anxiety state, unspecified   . Non-ischemic cardiomyopathy     a.  cath 6/11: OM2 30%; EF 30%;  b. echo 2/12: EF 30%, mild MR, mild LAE  . Atrial fibrillation     a. s/p DCCV 6/11; previously on Pradaxa;  b. Event Monitor 2012->No PAF  . LBBB (left bundle branch block)     intermittent  . Hx of colonic polyps   . Osteoarthrosis, unspecified whether generalized or localized, hand   . Diarrhea   . Gastroenteritis   . Abdominal pain, right upper quadrant   . Folliculitis   . Diabetes mellitus     Past Surgical History  Procedure Date  . Neck surgery     plate/fusion  . Bilateral knee arthroscopy   . Cardioversion 06/2009    Non ischemic cardiomyopathy - cath and echo  . Stress cardiolite 12/02/1998    Normal EF 52%  . Stress cardiolite 08/2005    Normal, EF 42%  . Cardiovascular stress test 2006    Neg per pt  . Upper gastrointestinal endoscopy 08/2006    GERD  . Abdominal US 01/2007    Fatty liver, no gallstones  . Doppler echocardiography 11/2009    Decreased EF of 35-40%  . Appendectomy     Allergies  No Known Allergies  HPI  52 y/o male with the above problem list.  He has been doing well though he does note a 10lb weight gain over the past month.  Despite weight gain, he has not had any edema, or orthopnea.  Last Friday however, he was walking an noted DOE.  On Saturday, he went to [place] [employer] and noted  significant DOE and exertional chest tightness.  He laid low on Sunday and had no real problems but yesterday, @ work, he was walking after lunch and again noted DOE.  He made himself a doctor's appt when he got home from work on Monday and when he awoke this AM, he noted mild chest pressure.  This persisted for about an hour and he went to his doctor's appt.  There, he was initially given GI cocktail w/o effect followed by sl NTG with complete relief of chest pain.  At that point EMS was called and he was transferred to the ED for further eval.  He did have more c/p en route and was given 1 additional SL NTG tab.  He is currently pain free.  POC Ti is nl, as is pBNP.  Home Medications  Prior to Admission medications   Medication Sig Start Date End Date Taking? Authorizing Provider  aspirin 325 MG tablet Take 325 mg by mouth daily.     Yes Historical Provider, MD  aspirin 81 MG chewable tablet Chew 81 mg by mouth daily.   Yes Historical Provider, MD  carvedilol (COREG) 25 MG tablet Take 1 tablet (25  mg total) by mouth 2 (two) times daily with a meal. 02/22/11  Yes Wendall Stade, MD  digoxin (LANOXIN) 0.25 MG tablet Take 1 tablet (250 mcg total) by mouth daily. 02/03/11  Yes Wendall Stade, MD  losartan-hydrochlorothiazide (HYZAAR) 100-12.5 MG per tablet Take 0.5 tablets by mouth daily. 04/30/11  Yes Wendall Stade, MD  Multiple Vitamin (MULTIVITAMIN) tablet Take 1 tablet by mouth daily.     Yes Historical Provider, MD  Omeprazole-Sodium Bicarbonate (ZEGERID OTC) 20-1100 MG CAPS Take 1 capsule by mouth daily.    Yes Historical Provider, MD  Blood Glucose Monitoring Suppl (ONE TOUCH ULTRA SYSTEM KIT) W/DEVICE KIT 1 kit by Does not apply route once. 10/09/10   Eustaquio Boyden, MD  DM-APAP-CPM (CORICIDIN HBP FLU PO) Take by mouth as needed.    Historical Provider, MD  glucose blood test strip Use as instructed.  250.02 10/09/10 10/09/11  Eustaquio Boyden, MD  Lancets [facility] ULTRASOFT) lancets Check sugars  twice daily. 250.02 10/09/10 10/09/11  Eustaquio Boyden, MD   Family History  Family History  Problem Relation Age of Onset  . Hypertension Mother   . Diabetes Mother   . Hypertension Father   . Diabetes Father   . Diabetes Brother   . Kidney disease Brother   . Diabetes Brother   . Heart failure Mother     Died @ 43  . Heart failure Father     Died @ 63  . Other Sister     4 sisters A&W   Social History  History   Social History  . Marital Status: Married    Spouse Name: [name]    Number of Children: 2  . Years of Education: [name]   Occupational History  . PAINTER [employer]   Social History Main Topics  . Smoking status: Never Smoker   . Smokeless tobacco: Not on file  . Alcohol Use: No  . Drug Use: No  . Sexually Active: Yes   Other Topics Concern  . Not on file   Social History Narrative   Lives in [place] with wife.  Works @ [employer] in [employer].  Not routinely exercising.     Review of Systems General:  No chills, fever, night sweats or weight changes.  Cardiovascular:  +++ chest pain & dyspnea on exertion as outlined above.  No edema, orthopnea, palpitations, paroxysmal nocturnal dyspnea. Dermatological: No rash, lesions/masses Respiratory: No cough. Urologic: No hematuria, dysuria Abdominal:   No nausea, vomiting, diarrhea, bright red blood per rectum, melena, or hematemesis Neurologic:  No visual changes, wkns, changes in mental status. All other systems reviewed and are otherwise negative except as noted above.  Physical Exam  Blood pressure 111/66, pulse 68, temperature 98 F (36.7 C), temperature source Oral, resp. rate 14, SpO2 99.00%.  General: Pleasant, NAD Psych: Normal affect. Neuro: Alert and oriented X 3. Moves all extremities spontaneously. HEENT: Normal  Neck: Supple without bruits or JVD. Lungs:  Resp regular and unlabored, CTA. Heart: RRR no s3, s4, or murmurs. Abdomen: Soft, non-tender, non-distended, BS + x 4.    Extremities: No clubbing, cyanosis.  Trace bilat. edema. DP/PT/Radials 2+ and equal bilaterally.  Labs  POC Ti: 0.00  PBNP: 36  No results found for this basename: CKTOTAL:4,CKMB:4,TROPONINI:4 in the last 72 hours Lab Results  Component Value Date   WBC 7.5 08/17/2011   HGB 15.5 08/17/2011   HCT 44.6 08/17/2011   MCV 85.1 08/17/2011   PLT 164 08/17/2011  Lab 08/17/11 1028  NA 139  K 3.8  CL 102  CO2 25  BUN 22  CREATININE 0.64  CALCIUM 9.2  PROT --  BILITOT --  ALKPHOS --  ALT --  AST --  GLUCOSE 154*   Radiology/Studies  Dg Chest Port 1 View  08/17/2011  *RADIOLOGY REPORT*  Clinical Data: Chest pain.  PORTABLE CHEST - 1 VIEW  Comparison: Chest 04/15/2010.  Findings: Heart size is normal.  Lungs are clear.  No pneumothorax or effusion.  IMPRESSION: No acute disease.  Original Report Authenticated By: Bernadene Bell. D'ALESSIO, M.D.   ECG  Rsr, 73, no acute st/t changes.  ASSESSMENT AND PLAN  See attdg note below  Signed, Nicolasa Ducking, NP 08/17/2011, 2:16 PM  Patient seen with NP, agree with the above note.  51 yo with history of PAF and nonischemic cardiomyopathy presents with symptoms consistent with unstable angina.  1. Chest pain: Symptoms seem consistent with unstable angina => dyspnea and chest tightness with less and less exertion culminating in rest pain today.  Symptoms relieved immediately by NTG.  Actually had cath in 2011 with minimal CAD.   However, given symptoms, I am still concerned that this represents ACS. CEs negative, ECG non-acute.  - LHC tomorrow.  - Heparin gtt, ASA, Coreg - Pain back to 2/10 => start NTG gtt at 5 mcg/min - cycle CEs.  2. Nonischemic cardiomyopathy: Prior echo with EF 35-40% in setting of prior cath with minimal CAD.  Continue Coreg, ARB, digoxin.  He does not appear volume overloaded on exam and BNP is normal.  I would get a repeat echo.  Check digoxin level.  3. Atrial fibrillation: Paroxysmal.  No recent symptomatic  episode.  He has not been on anticoagulation.  He has monitored his pulse during chest pain episodes and it is regular.  He is in NSR today.  Monitor on telemetry.   Marca Ancona 08/17/2011 2:39 PM

## 2011-08-17 NOTE — ED Notes (Signed)
Pt and wife aware of poc, report given to Lawson Fiscal RN in CDU while we wait for cardiology. Pt denies any chest pain or shortness of breath at this time.

## 2011-08-17 NOTE — ED Notes (Signed)
Pt. Denies any chest pain or sob.  [facility] at the bedside.

## 2011-08-17 NOTE — Assessment & Plan Note (Addendum)
No relief with GI cocktail.  CP free after NTG use.  Call place to [facility] with cards.  At this point, concern for progressive sx, ie unstable angina.  CP free now.  D/w pt, will send to ER.  >25 min spent with face to face with patient, >50% counseling and/or coordinating care 911 called and pt agrees with plan.

## 2011-08-17 NOTE — ED Notes (Signed)
Pt reports intermittent midsternal chest pain radiating to left chest since Friday. Associated with sob. Pt states that until this am pain and sob has only been with exertion but this am woke up with tightness to mid chest. Pt with hx of afib and cardioversion. NS on the monitor.

## 2011-08-17 NOTE — Progress Notes (Signed)
Nitrostat 0.4 mg given at 9:03 am.  [facility] [id number] Lot # [id number] Exp 11/15 L. Meiko Stranahan, CMA.

## 2011-08-17 NOTE — ED Notes (Signed)
Pt and wife aware we are waiting on cardiology. Pt requesting something to eat, aware we will wait till he is seen. Pt remains on monitor. No needs at this time.

## 2011-08-17 NOTE — ED Notes (Signed)
EMS-intermittent chest pain since Friday, associated with exertion. Pt woke up this am with chest pain went to urgent care given 1 nitro pain was relieved. En route radiated pain 2/10 2nd nitro given and 324 asa. Pt chest pain free on arrival, EKG shows LBBB, hx of same. 20g(L)hand.

## 2011-08-17 NOTE — ED Notes (Signed)
MD at bedside. 

## 2011-08-17 NOTE — Consult Note (Signed)
ANTICOAGULATION CONSULT NOTE - Follow-Up Consult  Pharmacy Consult for Heparin Indication: chest pain/ACS  No Known Allergies  Patient Measurements: Height: 6\' 4"  (193 cm) Weight: 284 lb 9.8 oz (129.1 kg) IBW/kg (Calculated) : 86.8  Heparin Dosing Weight: 114.6 kg  Vital Signs: Temp: 97.1 F (36.2 C) (07/23 2100) Temp src: Oral (07/23 2100) BP: 127/82 mmHg (07/23 2100) Pulse Rate: 68  (07/23 2100)  Labs:  Basename 08/17/11 2241 08/17/11 1908 08/17/11 1028  HGB -- -- 15.5  HCT -- -- 44.6  PLT -- -- 164  APTT -- -- --  LABPROT -- -- --  INR -- -- --  HEPARINUNFRC 0.18* -- --  CREATININE -- -- 0.64  CKTOTAL -- 178 --  CKMB -- 3.2 --  TROPONINI -- <0.30 --    Estimated Creatinine Clearance: 163.8 ml/min (by C-G formula based on Cr of 0.64).   Medical History: Past Medical History  Diagnosis Date  . Hypertension   . DJD (degenerative joint disease)   . Dermatophytosis of the body   . Calculus of kidney   . Esophageal reflux   . Other chronic nonalcoholic liver disease     fatty liver  . Unspecified hearing loss   . Anxiety state, unspecified   . Non-ischemic cardiomyopathy     a.  cath 6/11: OM2 30%; EF 30%;  b. echo 2/12: EF 30%, mild MR, mild LAE  . Atrial fibrillation     a. s/p DCCV 6/11; previously on Pradaxa;  b. Event Monitor 2012->No PAF  . LBBB (left bundle branch block)     intermittent  . Hx of colonic polyps   . Osteoarthrosis, unspecified whether generalized or localized, hand   . Diarrhea   . Gastroenteritis   . Abdominal pain, right upper quadrant   . Folliculitis   . Diabetes mellitus    Medications:  No anticoagulants pta  Assessment: 50yom with hx NICM and PAF presents to ED with unstable angina. Patient is on IV heparinand plan for [facility] tomorrow. Heparin level (0.18) is below-goal on 1600 units/hr. No problem with line/infusion per RN.   Goal of Therapy:  Heparin level 0.3-0.7 units/ml Monitor platelets by anticoagulation protocol:  Yes   Plan:  1. Heparin IV bolus of 3000 units x 1.  2. Increase IV heparin infusion to 2050 units/hr.  3. Heparin level, CBC in AM.   Emeline Gins 08/17/2011,11:43 PM

## 2011-08-18 ENCOUNTER — Encounter (HOSPITAL_COMMUNITY)
Admission: EM | Disposition: A | Discharge status: Home / Self Care | Payer: Self-pay | Source: Home / Self Care | Attending: Cardiology

## 2011-08-18 DIAGNOSIS — I472 Ventricular tachycardia: Secondary | ICD-10-CM | POA: Diagnosis not present

## 2011-08-18 DIAGNOSIS — I517 Cardiomegaly: Secondary | ICD-10-CM

## 2011-08-18 DIAGNOSIS — I428 Other cardiomyopathies: Secondary | ICD-10-CM

## 2011-08-18 DIAGNOSIS — R079 Chest pain, unspecified: Secondary | ICD-10-CM

## 2011-08-18 DIAGNOSIS — I4729 Other ventricular tachycardia: Secondary | ICD-10-CM | POA: Diagnosis not present

## 2011-08-18 HISTORY — PX: LEFT HEART CATHETERIZATION WITH CORONARY ANGIOGRAM: SHX5451

## 2011-08-18 LAB — LIPID PANEL: Total CHOL/HDL Ratio: 4.6 RATIO

## 2011-08-18 LAB — PROTIME-INR
INR: 1.15 (ref 0.00–1.49)
Prothrombin Time: 14.9 seconds (ref 11.6–15.2)

## 2011-08-18 LAB — CARDIAC PANEL(CRET KIN+CKTOT+MB+TROPI)
Relative Index: 1.9 (ref 0.0–2.5)
Relative Index: 2.1 (ref 0.0–2.5)
Total CK: 172 U/L (ref 7–232)

## 2011-08-18 LAB — MAGNESIUM: Magnesium: 2.1 mg/dL (ref 1.5–2.5)

## 2011-08-18 LAB — DIGOXIN LEVEL: Digoxin Level: 0.3 ng/mL — ABNORMAL LOW (ref 0.8–2.0)

## 2011-08-18 LAB — GLUCOSE, CAPILLARY: Glucose-Capillary: 110 mg/dL — ABNORMAL HIGH (ref 70–99)

## 2011-08-18 LAB — CBC
Hemoglobin: 14.9 g/dL (ref 13.0–17.0)
MCH: 29 pg (ref 26.0–34.0)
MCV: 84.8 fL (ref 78.0–100.0)
RBC: 5.13 MIL/uL (ref 4.22–5.81)

## 2011-08-18 LAB — BASIC METABOLIC PANEL
CO2: 25 mEq/L (ref 19–32)
Chloride: 102 mEq/L (ref 96–112)
Creatinine, Ser: 0.63 mg/dL (ref 0.50–1.35)
Potassium: 3.6 mEq/L (ref 3.5–5.1)

## 2011-08-18 LAB — TSH: TSH: 2.612 u[IU]/mL (ref 0.350–4.500)

## 2011-08-18 SURGERY — LEFT HEART CATHETERIZATION WITH CORONARY ANGIOGRAM
Anesthesia: LOCAL

## 2011-08-18 MED ORDER — HEPARIN (PORCINE) IN NACL 2-0.9 UNIT/ML-% IJ SOLN
INTRAMUSCULAR | Status: AC
  Filled 2011-08-18: qty 2000

## 2011-08-18 MED ORDER — PNEUMOCOCCAL VAC POLYVALENT 25 MCG/0.5ML IJ INJ
0.5000 mL | INJECTION | INTRAMUSCULAR | Status: DC
  Filled 2011-08-18: qty 0.5

## 2011-08-18 MED ORDER — NITROGLYCERIN 0.2 MG/ML ON CALL CATH LAB
INTRAVENOUS | Status: AC
  Filled 2011-08-18: qty 1

## 2011-08-18 MED ORDER — FENTANYL CITRATE 0.05 MG/ML IJ SOLN
INTRAMUSCULAR | Status: AC
  Filled 2011-08-18: qty 2

## 2011-08-18 MED ORDER — VERAPAMIL HCL 2.5 MG/ML IV SOLN
INTRAVENOUS | Status: AC
  Filled 2011-08-18: qty 2

## 2011-08-18 MED ORDER — SODIUM CHLORIDE 0.9 % IV SOLN: 1.0000 mL/kg/h | INTRAVENOUS | Status: AC

## 2011-08-18 MED ORDER — MIDAZOLAM HCL 2 MG/2ML IJ SOLN
INTRAMUSCULAR | Status: AC
  Filled 2011-08-18: qty 2

## 2011-08-18 MED ORDER — LIDOCAINE HCL (PF) 1 % IJ SOLN
INTRAMUSCULAR | Status: AC
  Filled 2011-08-18: qty 30

## 2011-08-18 MED ORDER — HEPARIN SODIUM (PORCINE) 1000 UNIT/ML IJ SOLN
INTRAMUSCULAR | Status: AC
  Filled 2011-08-18: qty 1

## 2011-08-18 NOTE — CV Procedure (Signed)
   Cardiac Catheterization Procedure Note  Name: Aaron Thompson MRN: [id number] DOB: 04-14-1960  Procedure: Left Heart Cath, Selective Coronary Angiography, LV angiography  Indication: 51 year old white male with history of nonischemic cardiomyopathy presents to symptoms of progressive chest pain and dyspnea on exertion. On telemetry he had nonsustained ventricular tachycardia.   Procedural Details: The right wrist was prepped, draped, and anesthetized with 1% lidocaine. Using the modified Seldinger technique, a 5 French sheath was introduced into the right radial artery. 3 mg of verapamil was administered through the sheath, weight-based unfractionated heparin was administered intravenously. Standard Judkins catheters were used for selective coronary angiography and left ventriculography. Catheter exchanges were performed over an exchange length guidewire. There were no immediate procedural complications. A TR band was used for radial hemostasis at the completion of the procedure.  The patient was transferred to the post catheterization recovery area for further monitoring.  Procedural Findings: Hemodynamics: AO 117/72 with a mean of 93 mmHg LV 114/8 mmHg  Coronary angiography: Coronary dominance: right  Left mainstem: The left main is angulated at its origin with 20% narrowing. It is otherwise normal.  Left anterior descending (LAD): The LAD demonstrates mild disease in the mid vessel up to 20%.  Left circumflex (LCx): There is mild nonobstructive disease in the mid vessel up to 20%.  Right coronary artery (RCA): The right coronary is a dominant vessel. It has minor irregularities in the proximal vessel and at the proximal less than 20%.  Left ventriculography: Left ventricular systolic function is abnormal. There is global hypokinesis with mild to moderate left ventricular dysfunction. Ejection fraction is estimated at 45%.  Final Conclusions:   1. Minor nonobstructive coronary  disease. 2. Mild to moderate left ventricular dysfunction with ejection fraction of 45%.  Recommendations: Continue medical management.  Theron Arista [facility] 08/18/2011, 10:33 AM

## 2011-08-18 NOTE — Progress Notes (Signed)
   TELEMETRY: Reviewed telemetry pt in NSR, had a 16 beat run of NSVT this am: Filed Vitals:   08/17/11 1701 08/17/11 1848 08/17/11 2100 08/18/11 0622  BP: 133/77 116/78 127/82 124/82  Pulse: 72 66 68 66  Temp:  97.7 F (36.5 C) 97.1 F (36.2 C) 98 F (36.7 C)  TempSrc:  Oral Oral Oral  Resp: 18 20 20 20   Height:      Weight:  129.1 kg (284 lb 9.8 oz)    SpO2: 98% 98% 95% 95%    Intake/Output Summary (Last 24 hours) at 08/18/11 0753 Last data filed at 08/17/11 1736  Gross per 24 hour  Intake    200 ml  Output      0 ml  Net    200 ml    SUBJECTIVE No further chest pain or SOB. Denies any palpitations.  LABS: Basic Metabolic Panel:  Basename 08/17/11 1028  NA 139  K 3.8  CL 102  CO2 25  GLUCOSE 154*  BUN 22  CREATININE 0.64  CALCIUM 9.2  MG --  PHOS --   CBC:  Basename 08/18/11 0650 08/17/11 1028  WBC 7.2 7.5  NEUTROABS -- 5.3  HGB 14.9 15.5  HCT 43.5 44.6  MCV 84.8 85.1  PLT 142* 164   Cardiac Enzymes:  Basename 08/18/11 0027 08/17/11 1908  CKTOTAL 172 178  CKMB 3.2 3.2  CKMBINDEX -- --  TROPONINI <0.30 <0.30   Radiology/Studies:  Dg Chest Port 1 View  08/17/2011  *RADIOLOGY REPORT*  Clinical Data: Chest pain.  PORTABLE CHEST - 1 VIEW  Comparison: Chest 04/15/2010.  Findings: Heart size is normal.  Lungs are clear.  No pneumothorax or effusion.  IMPRESSION: No acute disease.  Original Report Authenticated By: Bernadene Bell. D'ALESSIO, M.D.   Ecg NSR, normal Ecg.  PHYSICAL EXAM General: Well developed, well nourished, in no acute distress. Head: Normocephalic, atraumatic, sclera non-icteric, no xanthomas, nares are without discharge. Neck: Negative for carotid bruits. JVD not elevated. Lungs: Clear bilaterally to auscultation without wheezes, rales, or rhonchi. Breathing is unlabored. Heart: RRR S1 S2 without murmurs, rubs, or gallops.  Abdomen: Soft, non-tender, non-distended with normoactive bowel sounds. No hepatomegaly. No rebound/guarding. No  obvious abdominal masses. Msk:  Strength and tone appears normal for age. Extremities: No clubbing, cyanosis or edema.  Distal pedal pulses are 2+ and equal bilaterally. Neuro: Alert and oriented X 3. Moves all extremities spontaneously. Psych:  Responds to questions appropriately with a normal affect.  ASSESSMENT AND PLAN: 1. Chest pain c/w unstable angina with new exertional chest pain and DOE. Mild nonobstructive CAD by cath 2011. Patient has ruled out for MI. Plan cardiac cath today.  2. Nonischemic cardiomyopathy Prior Echo 2011 EF 35-40%. Need to update. Continue ARB, carvedilol  and Dig. Dig level 0.3. Does not appear volume overloaded.   3. NSVT asymptomatic. If EF < 35% may need to consider ICD. Check magnesium level.  4. Paroxysmal atrial fibrillation.  5. HTN controlled  Principal Problem:  *Unstable angina Active Problems:  Nonischemic cardiomyopathy  NSVT (nonsustained ventricular tachycardia)    Signed, Germaine Ripp [place] MD,FACC 08/18/2011 7:53 AM

## 2011-08-18 NOTE — ED Provider Notes (Signed)
Medical screening examination/treatment/procedure(s) were performed by non-physician practitioner and as supervising physician I was immediately available for consultation/collaboration.  Derwood Kaplan, MD 08/18/11 [phone]

## 2011-08-18 NOTE — Care Management Note (Unsigned)
    Page 1 of 1   08/18/2011     10:47:14 AM   CARE MANAGEMENT NOTE 08/18/2011  Patient:  DELL, BRINER   Account Number:  [id number]  Date Initiated:  08/18/2011  Documentation initiated by:  SIMMONS,Keenan Dimitrov  Subjective/Objective Assessment:   ADMITTED WITH CHEST PAIN; LIVES AT HOME WITH WIFE; WAS IPTA.     Action/Plan:   DISCHARGE PLANNING INITIATED.   Anticipated DC Date:  08/19/2011   Anticipated DC Plan:  HOME/SELF CARE      DC Planning Services  CM consult      Choice offered to / List presented to:             Status of service:  In process, will continue to follow Medicare Important Message given?   (If response is "NO", the following Medicare IM given date fields will be blank) Date Medicare IM given:   Date Additional Medicare IM given:    Discharge Disposition:    Per UR Regulation:  Reviewed for med. necessity/level of care/duration of stay  If discussed at Long Length of Stay Meetings, dates discussed:    Comments:  08/18/11  1046  Keyoni Lapinski SIMMONS RN, BSN [phone] NCM WILL FOLLOW.

## 2011-08-18 NOTE — H&P (View-Only) (Signed)
   TELEMETRY: Reviewed telemetry pt in NSR, had a 16 beat run of NSVT this am: Filed Vitals:   08/17/11 1701 08/17/11 1848 08/17/11 2100 08/18/11 0622  BP: 133/77 116/78 127/82 124/82  Pulse: 72 66 68 66  Temp:  97.7 F (36.5 C) 97.1 F (36.2 C) 98 F (36.7 C)  TempSrc:  Oral Oral Oral  Resp: 18 20 20 20  Height:      Weight:  129.1 kg (284 lb 9.8 oz)    SpO2: 98% 98% 95% 95%    Intake/Output Summary (Last 24 hours) at 08/18/11 0753 Last data filed at 08/17/11 1736  Gross per 24 hour  Intake    200 ml  Output      0 ml  Net    200 ml    SUBJECTIVE No further chest pain or SOB. Denies any palpitations.  LABS: Basic Metabolic Panel:  Basename 08/17/11 1028  NA 139  K 3.8  CL 102  CO2 25  GLUCOSE 154*  BUN 22  CREATININE 0.64  CALCIUM 9.2  MG --  PHOS --   CBC:  Basename 08/18/11 0650 08/17/11 1028  WBC 7.2 7.5  NEUTROABS -- 5.3  HGB 14.9 15.5  HCT 43.5 44.6  MCV 84.8 85.1  PLT 142* 164   Cardiac Enzymes:  Basename 08/18/11 0027 08/17/11 1908  CKTOTAL 172 178  CKMB 3.2 3.2  CKMBINDEX -- --  TROPONINI <0.30 <0.30   Radiology/Studies:  Dg Chest Port 1 View  08/17/2011  *RADIOLOGY REPORT*  Clinical Data: Chest pain.  PORTABLE CHEST - 1 VIEW  Comparison: Chest 04/15/2010.  Findings: Heart size is normal.  Lungs are clear.  No pneumothorax or effusion.  IMPRESSION: No acute disease.  Original Report Authenticated By: THOMAS L. D'ALESSIO, M.D.   Ecg NSR, normal Ecg.  PHYSICAL EXAM General: Well developed, well nourished, in no acute distress. Head: Normocephalic, atraumatic, sclera non-icteric, no xanthomas, nares are without discharge. Neck: Negative for carotid bruits. JVD not elevated. Lungs: Clear bilaterally to auscultation without wheezes, rales, or rhonchi. Breathing is unlabored. Heart: RRR S1 S2 without murmurs, rubs, or gallops.  Abdomen: Soft, non-tender, non-distended with normoactive bowel sounds. No hepatomegaly. No rebound/guarding. No  obvious abdominal masses. Msk:  Strength and tone appears normal for age. Extremities: No clubbing, cyanosis or edema.  Distal pedal pulses are 2+ and equal bilaterally. Neuro: Alert and oriented X 3. Moves all extremities spontaneously. Psych:  Responds to questions appropriately with a normal affect.  ASSESSMENT AND PLAN: 1. Chest pain c/w unstable angina with new exertional chest pain and DOE. Mild nonobstructive CAD by cath 2011. Patient has ruled out for MI. Plan cardiac cath today.  2. Nonischemic cardiomyopathy Prior Echo 2011 EF 35-40%. Need to update. Continue ARB, carvedilol  and Dig. Dig level 0.3. Does not appear volume overloaded.   3. NSVT asymptomatic. If EF < 35% may need to consider ICD. Check magnesium level.  4. Paroxysmal atrial fibrillation.  5. HTN controlled  Principal Problem:  *Unstable angina Active Problems:  Nonischemic cardiomyopathy  NSVT (nonsustained ventricular tachycardia)    Signed, Peter Jordan MD,FACC 08/18/2011 7:53 AM    

## 2011-08-18 NOTE — Interval H&P Note (Signed)
History and Physical Interval Note:  08/18/2011 10:00 AM  Aaron Thompson  has presented today for surgery, with the diagnosis of cp  The various methods of treatment have been discussed with the patient and family. After consideration of risks, benefits and other options for treatment, the patient has consented to  Procedure(s) (LRB): LEFT HEART CATHETERIZATION WITH CORONARY ANGIOGRAM ([name]) as a surgical intervention .  The patient's history has been reviewed, patient examined, no change in status, stable for surgery.  I have reviewed the patient's chart and labs.  Questions were answered to the patient's satisfaction.     Theron Arista [facility] 08/18/2011 10:00 AM

## 2011-08-18 NOTE — Progress Notes (Signed)
  Echocardiogram 2D Echocardiogram has been performed.  Cathie Beams 08/18/2011, 2:47 PM

## 2011-08-19 DIAGNOSIS — I472 Ventricular tachycardia: Secondary | ICD-10-CM

## 2011-08-19 DIAGNOSIS — R079 Chest pain, unspecified: Principal | ICD-10-CM

## 2011-08-19 DIAGNOSIS — I10 Essential (primary) hypertension: Secondary | ICD-10-CM

## 2011-08-19 LAB — CBC
HCT: 45.1 % (ref 39.0–52.0)
RDW: 13.6 % (ref 11.5–15.5)
WBC: 8.2 10*3/uL (ref 4.0–10.5)

## 2011-08-19 MED ORDER — LOSARTAN POTASSIUM-HCTZ 50-12.5 MG PO TABS: 1.0000 | ORAL_TABLET | Freq: Every day | ORAL | Status: DC

## 2011-08-19 NOTE — Discharge Summary (Signed)
Patient seen and examined and history reviewed. Agree with above findings and plan. See rounding note from earlier today.  Theron Arista [facility] 08/19/2011 1:37 PM

## 2011-08-19 NOTE — Discharge Summary (Signed)
CARDIOLOGY DISCHARGE SUMMARY   Patient ID: Aaron Thompson MRN: [id number] DOB/AGE: 07-27-1960 51 y.o.  Admit date: 08/17/2011 Discharge date: 08/19/2011  Primary Discharge Diagnosis: Chest pain, medical therapy for non-obstructive CAD/ DOE  Secondary Discharge Diagnosis:   Nonischemic cardiomyopathy  NSVT (nonsustained ventricular tachycardia) . Hypertension   . DJD (degenerative joint disease)   . Dermatophytosis of the body   . Calculus of kidney   . Esophageal reflux   . Other chronic nonalcoholic liver disease     fatty liver  . Unspecified hearing loss   . Anxiety state, unspecified   . Non-ischemic cardiomyopathy     a.  cath 6/11: OM2 30%; EF 30%;  b. echo 2/12: EF 30%, mild MR, mild LAE  . Atrial fibrillation     a. s/p DCCV 6/11; previously on Pradaxa;  b. Event Monitor 2012->No PAF  . LBBB (left bundle branch block)     intermittent  . Hx of colonic polyps   . Osteoarthrosis, unspecified whether generalized or localized, hand   . Diarrhea   . Gastroenteritis   . Abdominal pain, right upper quadrant   . Folliculitis   . Diabetes mellitus     Procedures: Left Heart Cath, Selective Coronary Angiography, LV angiography, 2D Echo   Hospital Course: Mr. Wyndham is a 51 year old male with a history of nonischemic cardiomyopathy. He had dyspnea on exertion and chest pain he came to the hospital where he was admitted for further evaluation and treatment.  His cardiac enzymes were negative for MI. A 2-D echocardiogram showed an EF of 30%. He had a 16 beat run of nonsustained VT but was asymptomatic. He was taken to the cath lab on 08/18/2011 with the results described below. Medical therapy was recommended. He has a history of paroxysmal atrial fibrillation but was maintaining sinus rhythm in the hospital. He is to continue on his dig and beta blocker. He is also on an ARB and diuretic combination tablet for his left ventricular dysfunction but the dose of the ARB was  decreased as there was concern for hypotension.  Because of the discrepancy between the echocardiogram ejection fraction of 30% and a left ventriculogram at Of 45%, Dr. [place] recommended considering or cardiac MRI. However, the patient is claustrophobic and Dr. Swaziland felt Dr. Eden Emms can address this as an outpatient.  On 08/19/2011, Mr. Selbe was seen by Dr. [place]. He had no further VT. He has a marked sinus arrhythmia but no other arrhythmia or ectopy. His shortness of breath had improved in his chest pain had resolved. Dr. [place] evaluated Mr. Guillet and considered stable for discharge, to follow up as an outpatient.  Labs:   Lab Results  Component Value Date   WBC 8.2 08/19/2011   HGB 15.5 08/19/2011   HCT 45.1 08/19/2011   MCV 85.6 08/19/2011   PLT 152 08/19/2011    Lab 08/18/11 0650  NA 139  K 3.6  CL 102  CO2 25  BUN 16  CREATININE 0.63  CALCIUM 8.6  PROT --  BILITOT --  ALKPHOS --  ALT --  AST --  GLUCOSE 141*    Basename 08/18/11 0650 08/18/11 0027 08/17/11 1908  CKTOTAL 148 172 178  CKMB 3.1 3.2 3.2  CKMBINDEX -- -- --  TROPONINI <0.30 <0.30 <0.30   Lipid Panel     Component Value Date/Time   CHOL 151 08/18/2011 0650   TRIG 197* 08/18/2011 0650   HDL 33* 08/18/2011 0650   CHOLHDL 4.6 08/18/2011 0650  VLDL 39 08/18/2011 0650   LDLCALC 79 08/18/2011 0650    Pro B Natriuretic peptide (BNP)  Date/Time Value Range Status  08/17/2011 10:28 AM 36.3  0 - 125 pg/mL Final  08/01/2009 10:47 AM 78.8  0.0-100.0 pg/mL Final    Basename 08/18/11 0650  INR 1.15      Radiology: Dg Chest Port 1 View 08/17/2011  *RADIOLOGY REPORT*  Clinical Data: Chest pain.  PORTABLE CHEST - 1 VIEW  Comparison: Chest 04/15/2010.  Findings: Heart size is normal.  Lungs are clear.  No pneumothorax or effusion.  IMPRESSION: No acute disease.  Original Report Authenticated By: Bernadene Bell. Maricela Curet, M.D.    Cardiac Cath: 08/18/2011 Left mainstem: The left main is angulated at its origin with  20% narrowing. It is otherwise normal.  Left anterior descending (LAD): The LAD demonstrates mild disease in the mid vessel up to 20%.  Left circumflex (LCx): There is mild nonobstructive disease in the mid vessel up to 20%.  Right coronary artery (RCA): The right coronary is a dominant vessel. It has minor irregularities in the proximal vessel and at the proximal less than 20%.  Left ventriculography: Left ventricular systolic function is abnormal. There is global hypokinesis with mild to moderate left ventricular dysfunction. Ejection fraction is estimated at 45%.  Final Conclusions:  1. Minor nonobstructive coronary disease.  2. Mild to moderate left ventricular dysfunction with ejection fraction of 45%.    EKG: 18-Aug-2011 05:18:37 Normal sinus rhythm Normal ECG No change from one day earlier 69mm/s 4mm/mV 100Hz  8.0.1 12SL 241 HD CID: 1 Referred by: Marca Ancona Confirmed By: Shawnie Pons MD Vent. rate 70 BPM PR interval 164 ms QRS duration 98 ms QT/QTc 390/421 ms P-R-T axes 29 65 43  Echo: 08/18/2011 Study Conclusions - Left ventricle: The cavity size was mildly dilated. Systolic function was moderately to severely reduced. The estimated ejection fraction was 30%. Diffuse hypokinesis. Doppler parameters are consistent with abnormal left ventricular relaxation (grade 1 diastolic dysfunction). - Atrial septum: There was increased thickness of the septum, consistent with lipomatous hypertrophy.    FOLLOW UP PLANS AND APPOINTMENTS No Known Allergies  Medication List  As of 08/19/2011  9:49 AM   STOP taking these medications         losartan-hydrochlorothiazide 100-12.5 MG per tablet         TAKE these medications         aspirin 325 MG tablet   Take 325 mg by mouth daily.      aspirin 81 MG chewable tablet   Chew 81 mg by mouth daily.      carvedilol 25 MG tablet   Commonly known as: COREG   Take 1 tablet (25 mg total) by mouth 2 (two) times daily with a meal.       CORICIDIN HBP FLU PO   Take by mouth as needed.      digoxin 0.25 MG tablet   Commonly known as: LANOXIN   Take 1 tablet (250 mcg total) by mouth daily.      glucose blood test strip   Use as instructed.  250.02      losartan-hydrochlorothiazide 50-12.5 MG per tablet   Commonly known as: HYZAAR   Take 1 tablet by mouth daily.      multivitamin tablet   Take 1 tablet by mouth daily.      ONE TOUCH ULTRA SYSTEM KIT W/DEVICE Kit   1 kit by Does not apply route once.  onetouch ultrasoft lancets   Check sugars twice daily. 250.02      ZEGERID OTC 20-1100 MG Caps   Generic drug: Omeprazole-Sodium Bicarbonate   Take 1 capsule by mouth daily.           Follow-up Information    Follow up with Charlton Haws, MD. (August 19 th @ 9:30 am)    Contact information:   [address], [address] [place] [address] [phone]            BRING ALL MEDICATIONS WITH YOU TO FOLLOW UP APPOINTMENTS  Time spent with patient to include physician time: 35 min Signed: Theodore Demark 08/19/2011, 9:19 AM Co-Sign MD

## 2011-08-19 NOTE — Progress Notes (Signed)
TELEMETRY: Reviewed telemetry pt in NSR with marked sinus arrhythmia, no further VT seen: Filed Vitals:   08/18/11 0925 08/18/11 1357 08/18/11 2124 08/19/11 0617  BP:  113/73 104/67 117/82  Pulse: 68 73 69 66  Temp:  97.8 F (36.6 C) 98.6 F (37 C) 97.8 F (36.6 C)  TempSrc:  Oral Oral Oral  Resp:  20 20 20   Height:      Weight:    125.5 kg (276 lb 10.8 oz)  SpO2:  98% 97% 97%    Intake/Output Summary (Last 24 hours) at 08/19/11 0717 Last data filed at 08/18/11 1955  Gross per 24 hour  Intake    250 ml  Output      0 ml  Net    250 ml    SUBJECTIVE No further chest pain or SOB. Denies any palpitations. Feels well.  LABS: Basic Metabolic Panel:  Basename 08/18/11 0650 08/17/11 1028  NA 139 139  K 3.6 3.8  CL 102 102  CO2 25 25  GLUCOSE 141* 154*  BUN 16 22  CREATININE 0.63 0.64  CALCIUM 8.6 9.2  MG 2.1 --  PHOS -- --   CBC:  Basename 08/19/11 0520 08/18/11 0650 08/17/11 1028  WBC 8.2 7.2 --  NEUTROABS -- -- 5.3  HGB 15.5 14.9 --  HCT 45.1 43.5 --  MCV 85.6 84.8 --  PLT 152 142* --    D dimer .33  Cardiac Enzymes:  Basename 08/18/11 0650 08/18/11 0027 08/17/11 1908  CKTOTAL 148 172 178  CKMB 3.1 3.2 3.2  CKMBINDEX -- -- --  TROPONINI <0.30 <0.30 <0.30   Radiology/Studies:  Dg Chest Port 1 View  08/17/2011  *RADIOLOGY REPORT*  Clinical Data: Chest pain.  PORTABLE CHEST - 1 VIEW  Comparison: Chest 04/15/2010.  Findings: Heart size is normal.  Lungs are clear.  No pneumothorax or effusion.  IMPRESSION: No acute disease.  Original Report Authenticated By: Bernadene Bell. D'ALESSIO, M.D.   Ecg NSR, normal Ecg.  Echo: Transthoracic Echocardiography  Patient: Wilford, Merryfield MR #: [id number] Study Date: 08/18/2011 Gender: M Age: 49 Height: 193cm Weight: 129.1kg BSA: 2.71m^2 Pt. Status: Room: 2041  ORDERING [place], Peter REFERRING [place], Peter PERFORMING [place], [facility] ATTENDING Claudette Head SONOGRAPHER Cathie Beams Amanda Cockayne,  [place] cc:  ------------------------------------------------------------ LV EF: 30%  ------------------------------------------------------------ Indications: Previous study 02/27/2010. Ventricular tachycardia 427.1",.  ------------------------------------------------------------ History: PMH: Unstable angina. Nonischemic cardiomyopathy. Nonsustained ventricular tachycardia. Chest pain. Acute gastroenteritis. Atrial fibrillation. Risk factors: Hypertension. Diabetes mellitus.  ------------------------------------------------------------ Study Conclusions  - Left ventricle: The cavity size was mildly dilated. Systolic function was moderately to severely reduced. The estimated ejection fraction was 30%. Diffuse hypokinesis. Doppler parameters are consistent with abnormal left ventricular relaxation (grade 1 diastolic dysfunction). - Atrial septum: There was increased thickness of the septum, consistent with lipomatous hypertrophy. Transthoracic echocardiography. M-mode, complete 2D, spectral Doppler, and color Doppler. Height: Height: 193cm. Height: 76in. Weight: Weight: 129.1kg. Weight: 284lb. Body mass index: BMI: 34.6kg/m^2. Body surface area: BSA: 2.55m^2. Blood pressure: 113/73. Patient status: Inpatient. Location: Echo laboratory.  ------------------------------------------------------------  ------------------------------------------------------------ Left ventricle: The cavity size was mildly dilated. Systolic function was moderately to severely reduced. The estimated ejection fraction was 30%. Diffuse hypokinesis. Doppler parameters are consistent with abnormal left ventricular relaxation (grade 1 diastolic dysfunction).  ------------------------------------------------------------ Aortic valve: Structurally normal valve. Cusp separation was normal. Doppler: Transvalvular velocity was within the normal range. There was no stenosis. No  regurgitation.  ------------------------------------------------------------ Aorta: Aortic root: The aortic root was mildly dilated.  Ascending aorta: The ascending aorta was normal in size.  ------------------------------------------------------------ Mitral valve: Structurally normal valve. Leaflet separation was normal. Doppler: Transvalvular velocity was within the normal range. There was no evidence for stenosis. Trivial regurgitation.  ------------------------------------------------------------ Left atrium: The atrium was normal in size.  ------------------------------------------------------------ Atrial septum: There was increased thickness of the septum, consistent with lipomatous hypertrophy.  ------------------------------------------------------------ Right ventricle: The cavity size was normal. Wall thickness was normal. Systolic function was normal.  ------------------------------------------------------------ Pulmonic valve: Structurally normal valve. Cusp separation was normal. Doppler: Transvalvular velocity was within the normal range. Trivial regurgitation.  ------------------------------------------------------------ Tricuspid valve: Structurally normal valve. Leaflet separation was normal. Doppler: Transvalvular velocity was within the normal range. Trivial regurgitation.  ------------------------------------------------------------ Right atrium: The atrium was normal in size.  ------------------------------------------------------------  2D measurements Normal Doppler measurements Normal Left ventricle Left ventricle LVID ED, 53 mm 43-52 Ea, lat ann, 10. cm/s ------ chord, tiss DP 4 PLAX E/Ea, lat 3.9 ------ LVID ES, 43.27 mm 23-38 ann, tiss DP 4 chord, Ea, med ann, 5.6 cm/s ------ PLAX tiss DP 5 FS, 18 % >29 E/Ea, med 7.2 ------ chord, ann, tiss DP 6 PLAX Mitral valve LVPW, ED 12.6 mm ------ Peak E vel 41 cm/s ------ IVS/LVPW 1.02 <1.3 Peak A  vel 63. cm/s ------ ratio, ED 7 Ventricular septum Deceleration 387 ms 150-23 IVS, ED 12.9 mm ------ time 0 Aorta Peak E/A 0.6 ------ Root 43 mm ------ ratio diam, ED Right ventricle Left atrium Sa vel, lat 8.4 cm/s ------ AP dim 37 mm ------ ann, tiss DP 8 AP dim 1.39 cm/m^2 <2.2 index  ------------------------------------------------------------ Prepared and Electronically Authenticated by  Bensimhon, Daniel 2013-07-24T18:12:07.013  PHYSICAL EXAM General: Well developed, well nourished, in no acute distress. Head: Normocephalic, atraumatic, sclera non-icteric, no xanthomas, nares are without discharge. Neck: Negative for carotid bruits. JVD not elevated. Lungs: Clear bilaterally to auscultation without wheezes, rales, or rhonchi. Breathing is unlabored. Heart: RRR S1 S2 without murmurs, rubs, or gallops.  Abdomen: Soft, non-tender, non-distended with normoactive bowel sounds. No hepatomegaly. No rebound/guarding. No obvious abdominal masses. Msk:  Strength and tone appears normal for age. Extremities: No clubbing, cyanosis or edema.  Distal pedal pulses are 2+ and equal bilaterally. Right radial site looks good without hematoma. Neuro: Alert and oriented X 3. Moves all extremities spontaneously. Psych:  Responds to questions appropriately with a normal affect.  ASSESSMENT AND PLAN: 1. Chest pain- no significant CAD by cath. D-dimer negative.   2. Nonischemic cardiomyopathy Prior Echo 2011 EF 35-40%. Now Echo shows EF 30%. EF looked better by cath at 45%. Continue ARB, carvedilol  and Dig. Dig level 0.3. Does not appear volume overloaded.   3. NSVT asymptomatic. Important to know EF accurately. Consider cardiac MRI. Patient states he is claustrophobic and couldn't tolerate MRI before. Will need Dr. Eden Emms to address. If EF is less than 35% would recommend ICD. Will have patient wear event monitor on discharge.   4. Paroxysmal atrial fibrillation.  5. HTN controlled  Plan:  discharge home today with event monitor. Continue current meds. RTW Monday. Follow up in office with Dr. Eden Emms  Principal Problem:  *Unstable angina Active Problems:  Nonischemic cardiomyopathy  NSVT (nonsustained ventricular tachycardia)    Signed, Peter [place] MD,FACC 08/19/2011 7:17 AM

## 2011-08-20 ENCOUNTER — Telehealth: Payer: Self-pay | Admitting: Cardiovascular Disease

## 2011-08-20 DIAGNOSIS — I472 Ventricular tachycardia: Secondary | ICD-10-CM

## 2011-08-20 DIAGNOSIS — I480 Paroxysmal atrial fibrillation: Secondary | ICD-10-CM

## 2011-08-20 NOTE — Telephone Encounter (Signed)
New Problem:    Patient called in wanting to know when he would be able to return to work.  Please call back or fax work clearance to him at fax [phone].

## 2011-08-20 NOTE — Telephone Encounter (Signed)
Back to work letter written and faxed to pt and employer.

## 2011-08-20 NOTE — Telephone Encounter (Signed)
Aaron Thompson had a cath and needs a return to work letter faxed to his employer and also to his fax number.  He state he works for the [employer] and his normal work duties include heavy lifting and climbing.

## 2011-08-23 NOTE — Telephone Encounter (Signed)
I spoke with the patient. He was calling back to follow up on a heart monitor he was supposed to wear. In reviewing his chart, this in not in the discharge summary, but is in the progress notes from 7/25 with Dr. [place], that he should wear an event monitor as an outpatient for NSVT/ PAF. I spoke with Aaron Thompson and she does not have the order. I explained to the patient I would order this and forward to [place]. She will be in touch with him. The patient is agreeable.

## 2011-08-23 NOTE — Telephone Encounter (Signed)
Fu call °Pt calling back again °

## 2011-09-02 ENCOUNTER — Encounter (INDEPENDENT_AMBULATORY_CARE_PROVIDER_SITE_OTHER): Payer: 59

## 2011-09-02 DIAGNOSIS — I4891 Unspecified atrial fibrillation: Secondary | ICD-10-CM

## 2011-09-07 ENCOUNTER — Other Ambulatory Visit: Payer: Self-pay | Admitting: *Deleted

## 2011-09-07 MED ORDER — DIGOXIN 250 MCG PO TABS: 250.0000 ug | ORAL_TABLET | Freq: Every day | ORAL | Status: DC

## 2011-09-13 ENCOUNTER — Encounter: Payer: Self-pay | Admitting: *Deleted

## 2011-09-13 ENCOUNTER — Ambulatory Visit (INDEPENDENT_AMBULATORY_CARE_PROVIDER_SITE_OTHER): Payer: 59 | Admitting: Cardiovascular Disease

## 2011-09-13 ENCOUNTER — Encounter: Payer: Self-pay | Admitting: Internal Medicine

## 2011-09-13 ENCOUNTER — Encounter: Payer: Self-pay | Admitting: Cardiovascular Disease

## 2011-09-13 VITALS — BP 125/78 | HR 76 | Ht 76.0 in | Wt 284.0 lb

## 2011-09-13 DIAGNOSIS — I428 Other cardiomyopathies: Secondary | ICD-10-CM

## 2011-09-13 DIAGNOSIS — I10 Essential (primary) hypertension: Secondary | ICD-10-CM

## 2011-09-13 DIAGNOSIS — R0600 Dyspnea, unspecified: Secondary | ICD-10-CM

## 2011-09-13 DIAGNOSIS — R002 Palpitations: Secondary | ICD-10-CM

## 2011-09-13 DIAGNOSIS — R079 Chest pain, unspecified: Secondary | ICD-10-CM

## 2011-09-13 NOTE — Assessment & Plan Note (Signed)
Benign sinus arrhythmia no PAF in hospital continue beta blocker

## 2011-09-13 NOTE — Patient Instructions (Signed)
Your physician wants you to follow-up in:   6 MONTHS WITH DR Haywood Filler will receive a reminder letter in the mail two months in advance. If you don't receive a letter, please call our office to schedule the follow-up appointment. Your physician recommends that you continue on your current medications as directed. Please refer to the Current Medication list given to you today. Your physician has recommended that you have a cardiopulmonary stress test (CPX). CPX testing is a non-invasive measurement of heart and lung function. It replaces a traditional treadmill stress test. This type of test provides a tremendous amount of information that relates not only to your present condition but also for future outcomes. This test combines measurements of you ventilation, respiratory gas exchange in the lungs, electrocardiogram (EKG), blood pressure and physical response before, during, and following an exercise protocol.

## 2011-09-13 NOTE — Assessment & Plan Note (Signed)
Recent cath with no significant CAD  Observe

## 2011-09-13 NOTE — Progress Notes (Signed)
Patient ID: Aaron Thompson, male   DOB: [birth year], 51 y.o.   MRN: [id number] Aaron Thompson is a 51 year old male with a history of nonischemic cardiomyopathy. Recent admission 7/13 for chest pain and dyspnea  His cardiac enzymes were negative for MI. A 2-D echocardiogram showed an EF of 30%. He had a 16 beat run of nonsustained VT but was asymptomatic. He was taken to the cath lab on 08/18/2011 with the results described below. Medical therapy was recommended. He has a history of paroxysmal atrial fibrillation but was maintaining sinus rhythm in the hospital. He is to continue on his dig and beta blocker. He is also on an ARB and diuretic combination tablet for his left ventricular dysfunction but the dose of the ARB was decreased as there was concern for hypotension.  Because of the discrepancy between the echocardiogram ejection fraction of 30% and a left ventriculogram at Of 45%, Dr. [place] recommended considering or cardiac MRI. Aaron Thompson was not done due to claustrophobia.  Persistant exertional dyspnea since D/C    Cath:  08/17/11 Aaron Thompson Radial Procedural Findings:  Hemodynamics:  AO 117/72 with a mean of 93 mmHg  LV 114/8 mmHg  Coronary angiography:  Coronary dominance: right  Left mainstem: The left main is angulated at its origin with 20% narrowing. It is otherwise normal.  Left anterior descending (LAD): The LAD demonstrates mild disease in the mid vessel up to 20%.  Left circumflex (LCx): There is mild nonobstructive disease in the mid vessel up to 20%.  Right coronary artery (RCA): The right coronary is a dominant vessel. It has minor irregularities in the proximal vessel and at the proximal less than 20%.  Left ventriculography: Left ventricular systolic function is abnormal. There is global hypokinesis with mild to moderate left ventricular dysfunction. Ejection fraction is estimated at 45%.  Final Conclusions:  1. Minor nonobstructive coronary disease.  2. Mild to moderate left ventricular  dysfunction with ejection fraction of 45%.  Recommendations: Continue medical management.funote    ROS: Denies fever, malais, weight loss, blurry vision, decreased visual acuity, cough, sputum, SOB, hemoptysis, pleuritic pain, palpitaitons, heartburn, abdominal pain, melena, lower extremity edema, claudication, or rash.  All other systems reviewed and negative  General: Affect appropriate Healthy:  appears stated age HEENT: normal Neck supple with no adenopathy JVP normal no bruits no thyromegaly Lungs clear with no wheezing and good diaphragmatic motion Heart:  S1/S2 no murmur, no rub, gallop or click PMI normal Abdomen: benighn, BS positve, no tenderness, no AAA no bruit.  No HSM or HJR Distal pulses intact with no bruits No edema Neuro non-focal Skin warm and dry No muscular weakness   Current Outpatient Prescriptions  Medication Sig Dispense Refill  . aspirin 325 MG tablet Take 325 mg by mouth daily.        [name] aspirin 81 MG chewable tablet Chew 81 mg by mouth daily.      . Blood Glucose Monitoring Suppl (ONE TOUCH ULTRA SYSTEM KIT) W/DEVICE KIT 1 kit by Does not apply route once.  1 each  0  . carvedilol (COREG) 25 MG tablet Take 1 tablet (25 mg total) by mouth 2 (two) times daily with a meal.  60 tablet  8  . digoxin (LANOXIN) 0.25 MG tablet Take 1 tablet (250 mcg total) by mouth daily.  30 tablet  6  . DM-APAP-CPM (CORICIDIN HBP FLU PO) Take by mouth as needed.      [name] glucose blood test strip Use as instructed.  250.02  100 each  12  . Lancets (ONETOUCH ULTRASOFT) lancets Check sugars twice daily. 250.02  100 each  12  . losartan-hydrochlorothiazide (HYZAAR) 50-12.5 MG per tablet Take 1 tablet by mouth daily.  30 tablet  11  . Multiple Vitamin (MULTIVITAMIN) tablet Take 1 tablet by mouth daily.        Aaron Thompson Bicarbonate (ZEGERID OTC) 20-1100 MG CAPS Take 1 capsule by mouth daily.        Current Facility-Administered Medications  Medication Dose Route  Frequency Provider Last Rate Last Dose  . gi cocktail (Maalox,Lidocaine,Donnatal)  30 mL Oral Once Aaron Nam, MD        Allergies  Review of patient's allergies indicates no known allergies.  Electrocardiogram:  7/23  NSR rate 73  Normal ECG slightly poor R wave progression  Assessment and Plan

## 2011-09-13 NOTE — Assessment & Plan Note (Signed)
Well controlled.  Continue current medications and low sodium Dash type diet.    

## 2011-09-13 NOTE — Assessment & Plan Note (Signed)
Seems euvolemic  Dyspnea diproportionate to physical signs  Descrepincy on EF cath vs echo.  F/U CPX

## 2011-09-21 ENCOUNTER — Ambulatory Visit (HOSPITAL_COMMUNITY): Payer: 59

## 2011-09-22 ENCOUNTER — Ambulatory Visit (HOSPITAL_COMMUNITY): Payer: 59 | Attending: Cardiovascular Disease

## 2011-09-22 DIAGNOSIS — R0600 Dyspnea, unspecified: Secondary | ICD-10-CM

## 2011-09-22 DIAGNOSIS — R0602 Shortness of breath: Secondary | ICD-10-CM

## 2011-09-22 DIAGNOSIS — R0989 Other specified symptoms and signs involving the circulatory and respiratory systems: Secondary | ICD-10-CM | POA: Insufficient documentation

## 2011-09-22 DIAGNOSIS — R0609 Other forms of dyspnea: Secondary | ICD-10-CM | POA: Insufficient documentation

## 2011-09-28 ENCOUNTER — Inpatient Hospital Stay (HOSPITAL_COMMUNITY)
Admission: EM | Admit: 2011-09-28 | Discharge: 2011-09-30 | DRG: 310 | Disposition: A | Discharge status: Home / Self Care | Payer: 59 | Attending: Cardiology | Admitting: Cardiology

## 2011-09-28 ENCOUNTER — Encounter (HOSPITAL_COMMUNITY): Payer: Self-pay | Admitting: *Deleted

## 2011-09-28 ENCOUNTER — Emergency Department (HOSPITAL_COMMUNITY): Payer: 59

## 2011-09-28 DIAGNOSIS — E669 Obesity, unspecified: Secondary | ICD-10-CM | POA: Diagnosis present

## 2011-09-28 DIAGNOSIS — Z7982 Long term (current) use of aspirin: Secondary | ICD-10-CM

## 2011-09-28 DIAGNOSIS — E1159 Type 2 diabetes mellitus with other circulatory complications: Secondary | ICD-10-CM

## 2011-09-28 DIAGNOSIS — K7689 Other specified diseases of liver: Secondary | ICD-10-CM | POA: Diagnosis present

## 2011-09-28 DIAGNOSIS — E1165 Type 2 diabetes mellitus with hyperglycemia: Secondary | ICD-10-CM

## 2011-09-28 DIAGNOSIS — Z79899 Other long term (current) drug therapy: Secondary | ICD-10-CM

## 2011-09-28 DIAGNOSIS — I10 Essential (primary) hypertension: Secondary | ICD-10-CM | POA: Diagnosis present

## 2011-09-28 DIAGNOSIS — I4891 Unspecified atrial fibrillation: Secondary | ICD-10-CM

## 2011-09-28 DIAGNOSIS — M19049 Primary osteoarthritis, unspecified hand: Secondary | ICD-10-CM | POA: Diagnosis present

## 2011-09-28 DIAGNOSIS — R079 Chest pain, unspecified: Secondary | ICD-10-CM

## 2011-09-28 DIAGNOSIS — I428 Other cardiomyopathies: Secondary | ICD-10-CM | POA: Diagnosis present

## 2011-09-28 DIAGNOSIS — R000 Tachycardia, unspecified: Secondary | ICD-10-CM | POA: Diagnosis present

## 2011-09-28 DIAGNOSIS — K219 Gastro-esophageal reflux disease without esophagitis: Secondary | ICD-10-CM | POA: Diagnosis present

## 2011-09-28 DIAGNOSIS — F411 Generalized anxiety disorder: Secondary | ICD-10-CM | POA: Diagnosis present

## 2011-09-28 DIAGNOSIS — E119 Type 2 diabetes mellitus without complications: Secondary | ICD-10-CM

## 2011-09-28 LAB — CBC WITH DIFFERENTIAL/PLATELET
Basophils Absolute: 0.1 10*3/uL (ref 0.0–0.1)
Basophils Relative: 0 % (ref 0–1)
Eosinophils Absolute: 0.3 10*3/uL (ref 0.0–0.7)
Eosinophils Relative: 2 % (ref 0–5)
MCH: 29.6 pg (ref 26.0–34.0)
MCV: 85.1 fL (ref 78.0–100.0)
Platelets: 200 10*3/uL (ref 150–400)
RDW: 13.6 % (ref 11.5–15.5)
WBC: 11.7 10*3/uL — ABNORMAL HIGH (ref 4.0–10.5)

## 2011-09-28 LAB — URINALYSIS, ROUTINE W REFLEX MICROSCOPIC
Bilirubin Urine: NEGATIVE
Glucose, UA: 100 mg/dL — AB
Ketones, ur: NEGATIVE mg/dL
Protein, ur: NEGATIVE mg/dL

## 2011-09-28 LAB — RAPID URINE DRUG SCREEN, HOSP PERFORMED
Amphetamines: NOT DETECTED
Opiates: NOT DETECTED
Tetrahydrocannabinol: NOT DETECTED

## 2011-09-28 LAB — PHOSPHORUS: Phosphorus: 3.3 mg/dL (ref 2.3–4.6)

## 2011-09-28 LAB — BASIC METABOLIC PANEL
BUN: 20 mg/dL (ref 6–23)
Chloride: 103 mEq/L (ref 96–112)
Potassium: 3.6 mEq/L (ref 3.5–5.1)
Sodium: 140 mEq/L (ref 135–145)

## 2011-09-28 LAB — TROPONIN I: Troponin I: <0.3 ng/mL (ref <0.30)

## 2011-09-28 LAB — MAGNESIUM: Magnesium: 2.2 mg/dL (ref 1.5–2.5)

## 2011-09-28 MED ORDER — DIGOXIN 0.25 MG/ML IJ SOLN
0.5000 mg | Freq: Once | INTRAMUSCULAR | Status: AC
  Administered 2011-09-28: 0.5 mg via INTRAVENOUS
  Filled 2011-09-28: qty 2

## 2011-09-28 MED ORDER — DILTIAZEM HCL 25 MG/5ML IV SOLN
20.0000 mg | Freq: Once | INTRAVENOUS | Status: AC
  Administered 2011-09-29: 20 mg via INTRAVENOUS
  Filled 2011-09-28: qty 5

## 2011-09-28 MED ORDER — DILTIAZEM HCL 100 MG IV SOLR
5.0000 mg/h | INTRAVENOUS | Status: DC
  Administered 2011-09-29: 20 mg/h via INTRAVENOUS
  Filled 2011-09-28: qty 100

## 2011-09-28 MED ORDER — DILTIAZEM HCL 100 MG IV SOLR
5.0000 mg/h | Freq: Once | INTRAVENOUS | Status: AC
  Administered 2011-09-29: 15 mg/h via INTRAVENOUS
  Filled 2011-09-28: qty 100

## 2011-09-28 MED ORDER — SODIUM CHLORIDE 0.9 % IJ SOLN
3.0000 mL | Freq: Two times a day (BID) | INTRAMUSCULAR | Status: DC
  Administered 2011-09-29 (×2): 3 mL via INTRAVENOUS

## 2011-09-28 MED ORDER — METOPROLOL TARTRATE 1 MG/ML IV SOLN: 5.0000 mg | INTRAVENOUS | Status: DC

## 2011-09-28 MED ORDER — METOPROLOL TARTRATE 1 MG/ML IV SOLN
INTRAVENOUS | Status: AC
  Filled 2011-09-28: qty 5

## 2011-09-28 MED ORDER — METOPROLOL TARTRATE 1 MG/ML IV SOLN
2.5000 mg | INTRAVENOUS | Status: AC
  Administered 2011-09-28 (×3): 2.5 mg via INTRAVENOUS
  Filled 2011-09-28: qty 5

## 2011-09-28 MED ORDER — HEPARIN (PORCINE) IN NACL 100-0.45 UNIT/ML-% IJ SOLN
1750.0000 [IU]/h | INTRAMUSCULAR | Status: DC
  Administered 2011-09-28: 1750 [IU]/h via INTRAVENOUS
  Filled 2011-09-28 (×3): qty 250

## 2011-09-28 MED ORDER — HEPARIN BOLUS VIA INFUSION
5000.0000 [IU] | Freq: Once | INTRAVENOUS | Status: AC
  Administered 2011-09-28: 5000 [IU] via INTRAVENOUS

## 2011-09-28 MED ORDER — SODIUM CHLORIDE 0.9 % IV BOLUS (SEPSIS)
500.0000 mL | Freq: Once | INTRAVENOUS | Status: AC
  Administered 2011-09-28: 500 mL via INTRAVENOUS

## 2011-09-28 MED ORDER — DILTIAZEM LOAD VIA INFUSION
20.0000 mg | Freq: Once | INTRAVENOUS | Status: AC
  Administered 2011-09-29: 20 mg via INTRAVENOUS
  Filled 2011-09-28: qty 20

## 2011-09-28 NOTE — H&P (Signed)
Cardiology History and Physical  Roxy Manns, MD  History of Present Illness (and review of medical records): Aaron Thompson is a 51 y.o. male who presents for evaluation of palpitations.  He has known nonischemic cardiomyopathy, PAF on ASA, hx of NSVT, HTN who was recently admitted for chest pain in July.  He underwent cardiac cath which revealed mild nonobstructive disease and recently had follow up with metabolic exercise stress.  He now presents with onset of palpitations, shortness of breath and chest discomfort that began around 9pm.  He felt is pulse which was irregular and knew that he was in atrial fibrillation and so had his wife drive him to ED for further evaluation.  In the ED he was treated with lopressor and diltiazem, however, with minimal response.  He was admitted for further evaluation and management.  Previous diagnostic testing for coronary artery disease includes: cardiac catheterization, echocardiogram and exercise treadmill test. Previous history of cardiac disease includes Atrial Fibrillation Cardiomyopathy Chest Pain NSVT. Coronary artery disease risk factors include: hypertension, male gender and obesity (BMI >= 30 kg/m2). Patient denies history of CABG and coronary angioplasty.  Review of Systems Further review of systems was otherwise negative other than stated in HPI.  Patient Active Problem List   Diagnosis Date Noted  . Nonischemic cardiomyopathy 08/18/2011  . NSVT (nonsustained ventricular tachycardia) 08/18/2011  . Chest pain on exertion 08/17/2011  . Unstable angina 08/17/2011  . AGE (acute gastroenteritis) 02/08/2011  . Type II or unspecified type diabetes mellitus without mention of complication, not stated as uncontrolled 10/06/2010  . Palpitations 05/12/2010  . CARDIOMYOPATHY OTHER DISEASES CLASSIFIED ELSW 07/16/2009  . COLONIC POLYPS 12/09/2008  . TINEA CORPORIS 08/16/2008  . OSTEOARTHROSIS UNSPEC WHETHER GEN/LOCALIZED HAND 08/16/2008  .  HYPERTENSION 05/11/2007  . RENAL CALCULUS 05/11/2007  . FATTY LIVER DISEASE 03/20/2007  . ABDOMINAL PAIN, RIGHT UPPER QUADRANT 02/08/2007  . ANXIETY 10/04/2006  . HEARING IMPAIRMENT 10/04/2006  . Atrial fibrillation 10/04/2006  . FOLLICULITIS 10/04/2006  . OSTEOARTHRITIS 10/04/2006  . GERD 09/20/2006   Past Medical History  Diagnosis Date  . Hypertension   . DJD (degenerative joint disease)   . Dermatophytosis of the body   . Calculus of kidney   . Esophageal reflux   . Other chronic nonalcoholic liver disease     fatty liver  . Unspecified hearing loss   . Anxiety state, unspecified   . Non-ischemic cardiomyopathy     a.  cath 6/11: OM2 30%; EF 30%;  b. echo 2/12: EF 30%, mild MR, mild LAE  . Atrial fibrillation     a. s/p DCCV 6/11; previously on Pradaxa;  b. Event Monitor 2012->No PAF  . LBBB (left bundle branch block)     intermittent  . Hx of colonic polyps   . Osteoarthrosis, unspecified whether generalized or localized, hand   . Diarrhea   . Gastroenteritis   . Abdominal pain, right upper quadrant   . Folliculitis   . Diabetes mellitus     Past Surgical History  Procedure Date  . Neck surgery     plate/fusion  . Bilateral knee arthroscopy   . Cardioversion 06/2009    Non ischemic cardiomyopathy - cath and echo  . Stress cardiolite 12/02/1998    Normal EF 52%  . Stress cardiolite 08/2005    Normal, EF 42%  . Cardiovascular stress test 2006    Neg per pt  . Upper gastrointestinal endoscopy 08/2006    GERD  . Abdominal US 01/2007  Fatty liver, no gallstones  . Doppler echocardiography 11/2009    Decreased EF of 35-40%  . Appendectomy     Prescriptions prior to admission  Medication Sig Dispense Refill  . aspirin 325 MG tablet Take 325 mg by mouth daily.        . carvedilol (COREG) 25 MG tablet Take 1 tablet (25 mg total) by mouth 2 (two) times daily with a meal.  60 tablet  8  . digoxin (LANOXIN) 0.25 MG tablet Take 250 mcg by mouth daily.      [name]  losartan-hydrochlorothiazide (HYZAAR) 50-12.5 MG per tablet Take 1 tablet by mouth daily.      . Multiple Vitamin (MULTIVITAMIN) tablet Take 1 tablet by mouth daily.        Maxwell Caul Bicarbonate (ZEGERID OTC) 20-1100 MG CAPS Take 1 capsule by mouth daily.        No Known Allergies  History  Substance Use Topics  . Smoking status: Never Smoker   . Smokeless tobacco: Not on file  . Alcohol Use: No    Family History  Problem Relation Age of Onset  . Hypertension Mother   . Diabetes Mother   . Hypertension Father   . Diabetes Father   . Diabetes Brother   . Kidney disease Brother   . Diabetes Brother   . Heart failure Mother     Died @ 13  . Heart failure Father     Died @ 34  . Other Sister     4 sisters A&W     Objective: Patient Vitals for the past 8 hrs:  BP Temp Temp src Pulse Resp SpO2 Height Weight  09/29/11 0030 122/67 mmHg 97.9 F (36.6 C) Oral - 16  98 % 6\' 4"  (1.93 m) 128.6 kg (283 lb 8.2 oz)  09/28/11 2345 105/59 mmHg - - 62  14  99 % - -  09/28/11 2300 90/73 mmHg - - 46  19  99 % 6\' 4"  (1.93 m) 128.822 kg (284 lb)  09/28/11 2255 106/68 mmHg - - - 13  99 % - -  09/28/11 2245 96/61 mmHg - - 84  21  99 % - -  09/28/11 2232 125/73 mmHg - - - - 98 % - -  09/28/11 2230 125/73 mmHg - - 51  20  98 % - -  09/28/11 2215 153/130 mmHg - - 68  14  98 % - -  09/28/11 2200 145/130 mmHg - - 63  15  99 % - -  09/28/11 2129 147/99 mmHg 98.5 F (36.9 C) Oral 120  22  96 % - -   General Appearance:    Alert, cooperative, no distress, appears stated age, obese male  Head:    Normocephalic, without obvious abnormality, atraumatic  Eyes:     PERRL, EOMI, anicteric sclerae  Neck:   Supple, no carotid bruit or JVD  Lungs:     Clear to auscultation bilaterally, respirations unlabored  Heart:    irregular rate and rhythm, S1 and S2 normal, no murmur  Abdomen:     Soft, non-tender, normoactive bowel sounds  Extremities:   Extremities normal, atraumatic, no cyanosis or edema    Pulses:   2+ and symmetric all extremities  Skin:   no rashes or lesions  Neurologic:   No focal deficits. AAO x3   Results for orders placed during the hospital encounter of 09/28/11 (from the past 48 hour(s))  APTT     Status: Normal  Collection Time   09/28/11  9:40 PM      Component Value Range Comment   aPTT 27  24 - 37 seconds   BASIC METABOLIC PANEL     Status: Abnormal   Collection Time   09/28/11  9:40 PM      Component Value Range Comment   Sodium 140  135 - 145 mEq/L    Potassium 3.6  3.5 - 5.1 mEq/L    Chloride 103  96 - 112 mEq/L    CO2 26  19 - 32 mEq/L    Glucose, Bld 171 (*) 70 - 99 mg/dL    BUN 20  6 - 23 mg/dL    Creatinine, Ser 0.98  0.50 - 1.35 mg/dL    Calcium 9.7  8.4 - 11.9 mg/dL    GFR calc non Af Amer >90  >90 mL/min    GFR calc Af Amer >90  >90 mL/min   CBC WITH DIFFERENTIAL     Status: Abnormal   Collection Time   09/28/11  9:40 PM      Component Value Range Comment   WBC 11.7 (*) 4.0 - 10.5 K/uL    RBC 5.31  4.22 - 5.81 MIL/uL    Hemoglobin 15.7  13.0 - 17.0 g/dL    HCT 14.7  82.9 - 56.2 %    MCV 85.1  78.0 - 100.0 fL    MCH 29.6  26.0 - 34.0 pg    MCHC 34.7  30.0 - 36.0 g/dL    RDW 13.0  86.5 - 78.4 %    Platelets 200  150 - 400 K/uL    Neutrophils Relative 55  43 - 77 %    Neutro Abs 6.4  1.7 - 7.7 K/uL    Lymphocytes Relative 31  12 - 46 %    Lymphs Abs 3.6  0.7 - 4.0 K/uL    Monocytes Relative 12  3 - 12 %    Monocytes Absolute 1.4 (*) 0.1 - 1.0 K/uL    Eosinophils Relative 2  0 - 5 %    Eosinophils Absolute 0.3  0.0 - 0.7 K/uL    Basophils Relative 0  0 - 1 %    Basophils Absolute 0.1  0.0 - 0.1 K/uL   PROTIME-INR     Status: Normal   Collection Time   09/28/11  9:40 PM      Component Value Range Comment   Prothrombin Time 13.0  11.6 - 15.2 seconds    INR 0.96  0.00 - 1.49   MAGNESIUM     Status: Normal   Collection Time   09/28/11  9:40 PM      Component Value Range Comment   Magnesium 2.2  1.5 - 2.5 mg/dL   PHOSPHORUS     Status:  Normal   Collection Time   09/28/11  9:40 PM      Component Value Range Comment   Phosphorus 3.3  2.3 - 4.6 mg/dL   DIGOXIN LEVEL     Status: Abnormal   Collection Time   09/28/11  9:40 PM      Component Value Range Comment   Digoxin Level 0.3 (*) 0.8 - 2.0 ng/mL   TROPONIN I     Status: Normal   Collection Time   09/28/11  9:41 PM      Component Value Range Comment   Troponin I <0.30  <0.30 ng/mL   URINALYSIS, ROUTINE W REFLEX MICROSCOPIC     Status: Abnormal  Collection Time   09/28/11 10:28 PM      Component Value Range Comment   Color, Urine YELLOW  YELLOW    APPearance CLEAR  CLEAR    Specific Gravity, Urine 1.010  1.005 - 1.030    pH 7.0  5.0 - 8.0    Glucose, UA 100 (*) NEGATIVE mg/dL    Hgb urine dipstick TRACE (*) NEGATIVE    Bilirubin Urine NEGATIVE  NEGATIVE    Ketones, ur NEGATIVE  NEGATIVE mg/dL    Protein, ur NEGATIVE  NEGATIVE mg/dL    Urobilinogen, UA 0.2  0.0 - 1.0 mg/dL    Nitrite NEGATIVE  NEGATIVE    Leukocytes, UA NEGATIVE  NEGATIVE   URINE RAPID DRUG SCREEN (HOSP PERFORMED)     Status: Normal   Collection Time   09/28/11 10:28 PM      Component Value Range Comment   Opiates NONE DETECTED  NONE DETECTED    Cocaine NONE DETECTED  NONE DETECTED    Benzodiazepines NONE DETECTED  NONE DETECTED    Amphetamines NONE DETECTED  NONE DETECTED    Tetrahydrocannabinol NONE DETECTED  NONE DETECTED    Barbiturates NONE DETECTED  NONE DETECTED   URINE MICROSCOPIC-ADD ON     Status: Normal   Collection Time   09/28/11 10:28 PM      Component Value Range Comment   Squamous Epithelial / LPF RARE  RARE    RBC / HPF 0-2  <3 RBC/hpf    Dg Chest Port 1 View  09/28/2011  *RADIOLOGY REPORT*  Clinical Data: Chest pain.  Intra fibrillation.  High blood pressure.  Diabetic.  Nonischemic cardiomyopathy.  PORTABLE CHEST - 1 VIEW  Comparison: 08/17/2011.  Findings: Mild central pulmonary vascular prominence. No infiltrate, congestive heart failure or pneumothorax.  The patient would  eventually benefit from follow-up two-view chest with cardiac leads removed.  Heart size within normal limits.  Mildly tortuous aorta.  Prior cervical spine surgery.  IMPRESSION: Mild central pulmonary vascular prominence.  Please see above.   Original Report Authenticated By: Fuller Canada, M.D.     ECG:  At baseline prior sinus with LBBB, now appears to irregular wide complex tachycardia, suspect atrial fibrillation with RVR with underlying LBBB   Assessment: 89M hx of nonischemic CMP, mild nonobstructive CAD on recent cath, HTN, obesity, PAF presents with suspect Atrial fibrillation with RVR  Plan:  1. Admit to Cardiology 2. Continuous monitoring on Telemetry. 3. Repeat ekg on admit, prn chest pain or arrythmia 4. Given failure to response to BB and CCB, will start Amiodarone 5. Anticoagulate with Heparin gtt 6. Keep NPO for possible TEE/DCCV in am

## 2011-09-28 NOTE — Progress Notes (Signed)
ANTICOAGULATION CONSULT NOTE - Initial Consult  Pharmacy Consult for heparin Indication: atrial fibrillation  No Known Allergies  Patient Measurements: Height: 6\' 4"  (193 cm) Weight: 284 lb (128.822 kg) (Per 09/13/11 documentation) IBW/kg (Calculated) : 86.8  Heparin Dosing Weight: 115 kg   Vital Signs: Temp: 98.5 F (36.9 C) (09/03 2129) Temp src: Oral (09/03 2129) BP: 106/68 mmHg (09/03 2255) Pulse Rate: 68  (09/03 2215)  Labs:  Basename 09/28/11 2141 09/28/11 2140  HGB -- 15.7  HCT -- 45.2  PLT -- 200  APTT -- 27  LABPROT -- 13.0  INR -- 0.96  HEPARINUNFRC -- --  CREATININE -- 0.72  CKTOTAL -- --  CKMB -- --  TROPONINI <0.30 --    Estimated Creatinine Clearance: 163.7 ml/min (by C-G formula based on Cr of 0.72).   Medical History: Past Medical History  Diagnosis Date  . Hypertension   . DJD (degenerative joint disease)   . Dermatophytosis of the body   . Calculus of kidney   . Esophageal reflux   . Other chronic nonalcoholic liver disease     fatty liver  . Unspecified hearing loss   . Anxiety state, unspecified   . Non-ischemic cardiomyopathy     a.  cath 6/11: OM2 30%; EF 30%;  b. echo 2/12: EF 30%, mild MR, mild LAE  . Atrial fibrillation     a. s/p DCCV 6/11; previously on Pradaxa;  b. Event Monitor 2012->No PAF  . LBBB (left bundle branch block)     intermittent  . Hx of colonic polyps   . Osteoarthrosis, unspecified whether generalized or localized, hand   . Diarrhea   . Gastroenteritis   . Abdominal pain, right upper quadrant   . Folliculitis   . Diabetes mellitus     Medications:  Scheduled:    . digoxin  0.5 mg Intravenous Once  . diltiazem  20 mg Intravenous Once  . diltiazem (CARDIZEM) infusion  5-15 mg/hr Intravenous Once  . diltiazem  20 mg Intravenous Once  . gi cocktail  30 mL Oral Once  . metoprolol      . metoprolol  2.5 mg Intravenous Q5 min  . sodium chloride  500 mL Intravenous Once  . sodium chloride  3 mL Intravenous  Q12H  . DISCONTD: metoprolol  5 mg Intravenous Q10 min    Assessment: 51 yo male admitted with atrial fibrillation. Pharmacy consulted to manage IV heparin.  Goal of Therapy:  Heparin level 0.3-0.7 units/ml Monitor platelets by anticoagulation protocol: Yes   Plan:  1. Heparin 5000 unit IV bolus x 1, then IV infusion at 1750 units/hr.  2. Heparin level in 6 hours.  3. Daily CBC, heparin level.   Emeline Gins 09/28/2011,11:41 PM

## 2011-09-28 NOTE — ED Provider Notes (Addendum)
History     CSN: [id number]  Arrival date & time 09/28/11  2125   First MD Initiated Contact with Patient 09/28/11 2135      Chief Complaint  Patient presents with  . Chest Pain    (Consider location/radiation/quality/duration/timing/severity/associated sxs/prior treatment) HPI Comments: Pt with hx of NICM with EF of 30%, Afib on carvedilol comes in with cc of tachycardia. Pt states that around 9:15 pm pt was straining while defecating and he started feeling palpitations, and started having some chest discomfort, diaphoresis and shortness of breath. He has hx of a-fib, that has required cardioversion x 2 - so he decided to have his wife drive him to the ED. In the ED, patient's hemodynamically stable outside of the tachycardia and had no chest pain, sob, diaphoresis. Admits to taking his meds as prescribed, denies any illicits, stimulants. No recent infection and no n/v/f/c.  Patient is a 51 y.o. male presenting with chest pain. The history is provided by the patient.  Chest Pain Pertinent negatives for primary symptoms include no shortness of breath, no cough and no abdominal pain.     Past Medical History  Diagnosis Date  . Hypertension   . DJD (degenerative joint disease)   . Dermatophytosis of the body   . Calculus of kidney   . Esophageal reflux   . Other chronic nonalcoholic liver disease     fatty liver  . Unspecified hearing loss   . Anxiety state, unspecified   . Non-ischemic cardiomyopathy     a.  cath 6/11: OM2 30%; EF 30%;  b. echo 2/12: EF 30%, mild MR, mild LAE  . Atrial fibrillation     a. s/p DCCV 6/11; previously on Pradaxa;  b. Event Monitor 2012->No PAF  . LBBB (left bundle branch block)     intermittent  . Hx of colonic polyps   . Osteoarthrosis, unspecified whether generalized or localized, hand   . Diarrhea   . Gastroenteritis   . Abdominal pain, right upper quadrant   . Folliculitis   . Diabetes mellitus     Past Surgical History  Procedure Date   . Neck surgery     plate/fusion  . Bilateral knee arthroscopy   . Cardioversion 06/2009    Non ischemic cardiomyopathy - cath and echo  . Stress cardiolite 12/02/1998    Normal EF 52%  . Stress cardiolite 08/2005    Normal, EF 42%  . Cardiovascular stress test 2006    Neg per pt  . Upper gastrointestinal endoscopy 08/2006    GERD  . Abdominal US 01/2007    Fatty liver, no gallstones  . Doppler echocardiography 11/2009    Decreased EF of 35-40%  . Appendectomy     Family History  Problem Relation Age of Onset  . Hypertension Mother   . Diabetes Mother   . Hypertension Father   . Diabetes Father   . Diabetes Brother   . Kidney disease Brother   . Diabetes Brother   . Heart failure Mother     Died @ 39  . Heart failure Father     Died @ 52  . Other Sister     4 sisters A&W    History  Substance Use Topics  . Smoking status: Never Smoker   . Smokeless tobacco: Not on file  . Alcohol Use: No      Review of Systems  Constitutional: Negative for activity change and appetite change.  Respiratory: Negative for cough and shortness of breath.  Cardiovascular: Negative for chest pain.  Gastrointestinal: Negative for abdominal pain.  Genitourinary: Negative for dysuria.    Allergies  Review of patient's allergies indicates no known allergies.  Home Medications   Current Outpatient Rx  Name Route Sig Dispense Refill  . ASPIRIN 325 MG PO TABS Oral Take 325 mg by mouth daily.      [name] CARVEDILOL 25 MG PO TABS Oral Take 1 tablet (25 mg total) by mouth 2 (two) times daily with a meal. 60 tablet 8  . DIGOXIN 0.25 MG PO TABS Oral Take 250 mcg by mouth daily.    [name] LOSARTAN POTASSIUM-HCTZ 50-12.5 MG PO TABS Oral Take 1 tablet by mouth daily.    [name] ONE-DAILY MULTI VITAMINS PO TABS Oral Take 1 tablet by mouth daily.      [name] OMEPRAZOLE-SODIUM BICARBONATE 20-1100 MG PO CAPS Oral Take 1 capsule by mouth daily.       BP 106/68  Pulse 68  Temp 98.5 F (36.9 C) (Oral)  Resp  13  SpO2 99%  Physical Exam  Nursing note and vitals reviewed. Constitutional: He is oriented to person, place, and time. He appears well-developed.  HENT:  Head: Normocephalic and atraumatic.  Eyes: Conjunctivae normal and EOM are normal. Pupils are equal, round, and reactive to light.  Neck: Normal range of motion. Neck supple. No JVD present.  Cardiovascular:       Irregularly irregular, tachycarida  Pulmonary/Chest: Effort normal and breath sounds normal.  Abdominal: Soft. Bowel sounds are normal. He exhibits no distension. There is no tenderness. There is no rebound and no guarding.  Neurological: He is alert and oriented to person, place, and time.  Skin: Skin is warm.    ED Course  Procedures (including critical care time)  Labs Reviewed  BASIC METABOLIC PANEL - Abnormal; Notable for the following:    Glucose, Bld 171 (*)     All other components within normal limits  CBC WITH DIFFERENTIAL - Abnormal; Notable for the following:    WBC 11.7 (*)     Monocytes Absolute 1.4 (*)     All other components within normal limits  URINALYSIS, ROUTINE W REFLEX MICROSCOPIC - Abnormal; Notable for the following:    Glucose, UA 100 (*)     Hgb urine dipstick TRACE (*)     All other components within normal limits  DIGOXIN LEVEL - Abnormal; Notable for the following:    Digoxin Level 0.3 (*)     All other components within normal limits  APTT  TROPONIN I  PROTIME-INR  URINE RAPID DRUG SCREEN (HOSP PERFORMED)  MAGNESIUM  PHOSPHORUS  URINE MICROSCOPIC-ADD ON  BASIC METABOLIC PANEL  CBC  PROTIME-INR   Dg Chest Port 1 View  09/28/2011  *RADIOLOGY REPORT*  Clinical Data: Chest pain.  Intra fibrillation.  High blood pressure.  Diabetic.  Nonischemic cardiomyopathy.  PORTABLE CHEST - 1 VIEW  Comparison: 08/17/2011.  Findings: Mild central pulmonary vascular prominence. No infiltrate, congestive heart failure or pneumothorax.  The patient would eventually benefit from follow-up two-view  chest with cardiac leads removed.  Heart size within normal limits.  Mildly tortuous aorta.  Prior cervical spine surgery.  IMPRESSION: Mild central pulmonary vascular prominence.  Please see above.   Original Report Authenticated By: Fuller Canada, M.D.      1. Atrial fibrillation with RVR       MDM  DDX: Afib with RVR Afib with abberancy Vtach  Pt comes in with cc of afib. He is in  afib with RVR, but asymptomatic and with normal BP. Hx not suggestive of underlying etiology - will get the initial basic cardiac labs started.  The EKG shows tachycardia, wide qrs, LBBB - and with hx of afib with rvr and a non sustained vtach in the past - both of those conditions are possible. We consulted Caridology fellow - faxed over the EKGs to him, he too thinks it is likely afib with RVR in light of recent negatvie cardiac workup, including neg cath.  Pt is on carvedilol. He has NICM. Per Cardiology recs, we stayed with BB initially. Pt reports to hypotension with the BB - so we gave 2.5 mg dose x 3. BP dropped, HR came down from 180 to 160. Meantime, Dig level came back subtherapeutic, so we gave 500 mcg bolus of digoxin - and patient still remained tachycardic. He was also given 500 cc iv bolus.  Cards fellow came to ED, and wanted diltiazem started - which we ordered. He would not want cardioversion at this time. Pt has tolerated ever meds and intervention thus far. Admit to  Step down.     Date: 09/29/2011  Rate: 166  Rhythm: indeterminate  QRS Axis: left  Intervals: WNL  ST/T Wave abnormalities: nonspecific ST changes  Conduction Disutrbances:left bundle branch block  Narrative Interpretation:   Old EKG Reviewed: changes noted   Date: 09/29/2011  Rate: 155  Rhythm: atrial fibrillation  QRS Axis: left  Intervals: normal  ST/T Wave abnormalities: nonspecific ST/T changes  Conduction Disutrbances:left bundle branch block  Narrative Interpretation:   Old EKG Reviewed:  unchanged        CRITICAL CARE Performed by: Derwood Kaplan   Total critical care time: 45 minutes  Critical care time was exclusive of separately billable procedures and treating other patients.  Critical care was necessary to treat or prevent imminent or life-threatening deterioration.  Critical care was time spent personally by me on the following activities: development of treatment plan with patient and/or surrogate as well as nursing, discussions with consultants, evaluation of patient's response to treatment, examination of patient, obtaining history from patient or surrogate, ordering and performing treatments and interventions, ordering and review of laboratory studies, ordering and review of radiographic studies, pulse oximetry and re-evaluation of patient's condition.  Derwood Kaplan, MD 09/29/11 4098  Derwood Kaplan, MD 11/20/11 1191

## 2011-09-28 NOTE — ED Notes (Signed)
MD at bedside. Updated patient/family on POC.

## 2011-09-28 NOTE — ED Notes (Signed)
Patient was sitting at home when he had an acute onset of central part of his chest hurting and he experienced shortness of breath.  Patient denies any lightheadedness, nausea, or diaphoresis. Had a heart cath and was told he had 30 % blockage on last month and no stent

## 2011-09-29 ENCOUNTER — Encounter (HOSPITAL_COMMUNITY): Payer: Self-pay

## 2011-09-29 ENCOUNTER — Encounter (HOSPITAL_COMMUNITY)
Admission: EM | Disposition: A | Discharge status: Home / Self Care | Payer: Self-pay | Source: Home / Self Care | Attending: Cardiology

## 2011-09-29 ENCOUNTER — Encounter (HOSPITAL_COMMUNITY): Payer: Self-pay | Admitting: *Deleted

## 2011-09-29 ENCOUNTER — Inpatient Hospital Stay (HOSPITAL_COMMUNITY): Payer: 59 | Admitting: *Deleted

## 2011-09-29 DIAGNOSIS — I4891 Unspecified atrial fibrillation: Secondary | ICD-10-CM

## 2011-09-29 HISTORY — PX: CARDIOVERSION: SHX1299

## 2011-09-29 LAB — CBC
Hemoglobin: 15.1 g/dL (ref 13.0–17.0)
MCHC: 33.9 g/dL (ref 30.0–36.0)
RBC: 5.17 MIL/uL (ref 4.22–5.81)

## 2011-09-29 LAB — PROTIME-INR
INR: 1.09 (ref 0.00–1.49)
Prothrombin Time: 14.3 seconds (ref 11.6–15.2)

## 2011-09-29 LAB — BASIC METABOLIC PANEL
BUN: 19 mg/dL (ref 6–23)
GFR calc Af Amer: >90 mL/min (ref >90)
GFR calc non Af Amer: >90 mL/min (ref >90)
Potassium: 3.8 mEq/L (ref 3.5–5.1)
Sodium: 141 mEq/L (ref 135–145)

## 2011-09-29 SURGERY — Surgical Case
Anesthesia: *Unknown

## 2011-09-29 SURGERY — CARDIOVERSION
Anesthesia: Monitor Anesthesia Care | Wound class: Clean

## 2011-09-29 MED ORDER — AMIODARONE HCL IN DEXTROSE 360-4.14 MG/200ML-% IV SOLN
0.5000 mg/min | INTRAVENOUS | Status: DC
  Filled 2011-09-29 (×3): qty 200

## 2011-09-29 MED ORDER — AMIODARONE HCL IN DEXTROSE 360-4.14 MG/200ML-% IV SOLN
1.0000 mg/min | INTRAVENOUS | Status: AC
  Administered 2011-09-29 (×2): 1 mg/min via INTRAVENOUS
  Filled 2011-09-29 (×2): qty 200

## 2011-09-29 MED ORDER — AMIODARONE LOAD VIA INFUSION
150.0000 mg | Freq: Once | INTRAVENOUS | Status: AC
  Administered 2011-09-29: 150 mg via INTRAVENOUS
  Filled 2011-09-29: qty 83.34

## 2011-09-29 MED ORDER — ADULT MULTIVITAMIN W/MINERALS CH
1.0000 | ORAL_TABLET | Freq: Every day | ORAL | Status: DC
  Administered 2011-09-29 – 2011-09-30 (×2): 1 via ORAL
  Filled 2011-09-29 (×2): qty 1

## 2011-09-29 MED ORDER — RIVAROXABAN 20 MG PO TABS
20.0000 mg | ORAL_TABLET | Freq: Every day | ORAL | Status: DC
  Administered 2011-09-29: 20 mg via ORAL
  Filled 2011-09-29 (×2): qty 1

## 2011-09-29 MED ORDER — PROPOFOL 10 MG/ML IV BOLUS
INTRAVENOUS | Status: DC | PRN
  Administered 2011-09-29: 100 mg via INTRAVENOUS

## 2011-09-29 MED ORDER — DIGOXIN 250 MCG PO TABS
250.0000 ug | ORAL_TABLET | Freq: Every day | ORAL | Status: DC
  Administered 2011-09-29 – 2011-09-30 (×2): 250 ug via ORAL
  Filled 2011-09-29 (×2): qty 1

## 2011-09-29 MED ORDER — PANTOPRAZOLE SODIUM 40 MG PO TBEC
40.0000 mg | DELAYED_RELEASE_TABLET | Freq: Every day | ORAL | Status: DC
  Administered 2011-09-29 – 2011-09-30 (×2): 40 mg via ORAL
  Filled 2011-09-29: qty 1

## 2011-09-29 NOTE — Progress Notes (Signed)
Upon arrival, pt stated pain level 2/10.  Offered medication for pain, pt refused "it's much better than it was.  It's just because I am in the fib." Pt educated to call if he wanted pain medication and/or if the pain got worse. HR remains 140s Afib RVR, otherwise VSS.  NAD, will continue to monitor.

## 2011-09-29 NOTE — Interval H&P Note (Signed)
History and Physical Interval Note:  09/29/2011 2:56 PM  Aaron Thompson  has presented today for surgery, with the diagnosis of a fib/flutter  The various methods of treatment have been discussed with the patient and family. After consideration of risks, benefits and other options for treatment, the patient has consented to  Procedure(s) (LRB) with comments: CARDIOVERSION ([name]) as a surgical intervention .  The patient's history has been reviewed, patient examined, no change in status, stable for surgery.  I have reviewed the patient's chart and labs.  Questions were answered to the patient's satisfaction.     [name].

## 2011-09-29 NOTE — Preoperative (Signed)
Beta Blockers   Reason not to administer Beta Blockers:Not Applicable 

## 2011-09-29 NOTE — H&P (View-Only) (Signed)
Patient ID: Aaron Thompson, male   DOB: 03/20/1960, 51 y.o.   MRN: [id number]    Subjective:  Denies SSCP, palpitations or Dyspnea Clear onset of afib last night at 9:00pm  Objective:  Filed Vitals:   09/28/11 2300 09/28/11 2345 09/29/11 0030 09/29/11 0624  BP: 90/73 105/59 122/67 104/71  Pulse: 46 62  143  Temp:   97.9 F (36.6 C) 97.8 F (36.6 C)  TempSrc:   Oral Oral  Resp: 19 14 16 17  Height: 6' 4" (1.93 m)  6' 4" (1.93 m)   Weight: 284 lb (128.822 kg)  283 lb 8.2 oz (128.6 kg)   SpO2: 99% 99% 98% 98%    Intake/Output from previous day:  Intake/Output Summary (Last 24 hours) at 09/29/11 0755 Last data filed at 09/29/11 0519  Gross per 24 hour  Intake  89.42 ml  Output    200 ml  Net -110.58 ml    Physical Exam: Affect appropriate Healthy:  appears stated age HEENT: normal Neck supple with no adenopathy JVP normal no bruits no thyromegaly Lungs clear with no wheezing and good diaphragmatic motion Heart:  S1/S2 no murmur, no rub, gallop or click PMI normal Abdomen: benighn, BS positve, no tenderness, no AAA no bruit.  No HSM or HJR Distal pulses intact with no bruits No edema Neuro non-focal Skin warm and dry No muscular weakness   Lab Results: Basic Metabolic Panel:  Basename 09/28/11 2140  NA 140  K 3.6  CL 103  CO2 26  GLUCOSE 171*  BUN 20  CREATININE 0.72  CALCIUM 9.7  MG 2.2  PHOS 3.3   Liver Function Tests: No results found for this basename: AST:2,ALT:2,ALKPHOS:2,BILITOT:2,PROT:2,ALBUMIN:2 in the last 72 hours No results found for this basename: LIPASE:2,AMYLASE:2 in the last 72 hours CBC:  Basename 09/29/11 0645 09/28/11 2140  WBC 9.8 11.7*  NEUTROABS -- 6.4  HGB 15.1 15.7  HCT 44.5 45.2  MCV 86.1 85.1  PLT 166 200   Cardiac Enzymes:  Basename 09/28/11 2141  CKTOTAL --  CKMB --  CKMBINDEX --  TROPONINI <0.30    Imaging: Dg Chest Port 1 View  09/28/2011  *RADIOLOGY REPORT*  Clinical Data: Chest pain.  Intra fibrillation.   High blood pressure.  Diabetic.  Nonischemic cardiomyopathy.  PORTABLE CHEST - 1 VIEW  Comparison: 08/17/2011.  Findings: Mild central pulmonary vascular prominence. No infiltrate, congestive heart failure or pneumothorax.  The patient would eventually benefit from follow-up two-view chest with cardiac leads removed.  Heart size within normal limits.  Mildly tortuous aorta.  Prior cervical spine surgery.  IMPRESSION: Mild central pulmonary vascular prominence.  Please see above.   Original Report Authenticated By: STEVEN R. OLSON, M.D.     Cardiac Studies:  ECG:    Telemetry: afib rate 129    Echo:   Medications:     . amiodarone  150 mg Intravenous Once  . digoxin  0.5 mg Intravenous Once  . digoxin  250 mcg Oral Daily  . diltiazem  20 mg Intravenous Once  . diltiazem (CARDIZEM) infusion  5-15 mg/hr Intravenous Once  . diltiazem  20 mg Intravenous Once  . heparin  5,000 Units Intravenous Once  . metoprolol      . metoprolol  2.5 mg Intravenous Q5 min  . multivitamin with minerals  1 tablet Oral Daily  . pantoprazole  40 mg Oral Q1200  . sodium chloride  500 mL Intravenous Once  . sodium chloride  3 mL Intravenous Q12H  .   DISCONTD: metoprolol  5 mg Intravenous Q10 min       . amiodarone (NEXTERONE PREMIX) 360 mg/200 mL dextrose 1 mg/min (09/29/11 0519)   Followed by  . amiodarone (NEXTERONE PREMIX) 360 mg/200 mL dextrose    . heparin 1,750 Units/hr (09/28/11 2358)  . DISCONTD: diltiazem (CARDIZEM) infusion 20 mg/hr (09/29/11 0107)    Assessment/Plan:  Afib:  Continue heparin and give xarelto  DCC today since Afib is less than 24 hours continue cardizem  D/C amiodarone post DCC And consider sotolol DCM:  Stable with no signs of CHF EF 30% by echo 7/13  Jersee Winiarski 09/29/2011, 7:55 AM     

## 2011-09-29 NOTE — CV Procedure (Signed)
    Cardioversion Note  Aaron Thompson [id number] [birth year]  Procedure: DC Cardioversion Indications: atrial fibrillation   Procedure Details Consent: Obtained Time Out: Verified patient identification, verified procedure, site/side was marked, verified correct patient position, special equipment/implants available, Radiology Safety Procedures followed,  medications/allergies/relevent history reviewed, required imaging and test results available.  Performed  The patient has been on adequate anticoagulation.  The patient received IV Propofol 100 mg  for sedation.  Synchronous cardioversion was performed at 200  joules.  The cardioversion was successful    Complications: No apparent complications Patient did tolerate procedure well.   Vesta Mixer, Montez Hageman., MD, [facility] 09/29/2011, 3:03 PM

## 2011-09-29 NOTE — Transfer of Care (Signed)
Immediate Anesthesia Transfer of Care Note  Patient: Aaron Thompson  Procedure(s) Performed: Procedure(s) (LRB) with comments: CARDIOVERSION ([name])  Patient Location: PACU and Nursing Unit  Anesthesia Type: General  Level of Consciousness: awake, alert  and oriented  Airway & Oxygen Therapy: Patient Spontanous Breathing and Patient connected to nasal cannula oxygen  Post-op Assessment: Report given to PACU RN and Post -op Vital signs reviewed and stable  Post vital signs: Reviewed and stable  Complications: No apparent anesthesia complications

## 2011-09-29 NOTE — Care Management Note (Unsigned)
    Page 1 of 1   09/29/2011     11:37:11 AM   CARE MANAGEMENT NOTE 09/29/2011  Patient:  SAMPSON, SELF   Account Number:  [id number]  Date Initiated:  09/29/2011  Documentation initiated by:  SIMMONS,Lafern Brinkley  Subjective/Objective Assessment:   ADMITTED WITH AFIB; LIVES AT HOME WITH WIFE- SHEILA; WAS IPA- STILL WORKS; [facility].     Action/Plan:   DISCHARGE PLANNING DISCUSSED AT BEDSIDE.   Anticipated DC Date:  09/30/2011   Anticipated DC Plan:  HOME/SELF CARE      DC Planning Services  CM consult  Medication Assistance      Choice offered to / List presented to:             Status of service:  In process, will continue to follow Medicare Important Message given?   (If response is "NO", the following Medicare IM given date fields will be blank) Date Medicare IM given:   Date Additional Medicare IM given:    Discharge Disposition:    Per UR Regulation:  Reviewed for med. necessity/level of care/duration of stay  If discussed at Long Length of Stay Meetings, dates discussed:    Comments:  09/29/11  1135  Britne Borelli SIMMONS RN, BSN [phone] PT PROVIDED WITH XARELTO ASSISTANCE CARD; MD- PLEASE WRITE A SEPARATE RX FOR 10 DAYS SUPPLY OF XARELTO. NCM WILL FOLLOW.

## 2011-09-29 NOTE — Progress Notes (Signed)
Patient ID: Aaron Thompson, male   DOB: December 02, 1960, 51 y.o.   MRN: [id number]    Subjective:  Denies SSCP, palpitations or Dyspnea Clear onset of afib last night at 9:00pm  Objective:  Filed Vitals:   09/28/11 2300 09/28/11 2345 09/29/11 0030 09/29/11 0624  BP: 90/73 105/59 122/67 104/71  Pulse: 46 62  143  Temp:   97.9 F (36.6 C) 97.8 F (36.6 C)  TempSrc:   Oral Oral  Resp: 19 14 16 17   Height: 6\' 4"  (1.93 m)  6\' 4"  (1.93 m)   Weight: 284 lb (128.822 kg)  283 lb 8.2 oz (128.6 kg)   SpO2: 99% 99% 98% 98%    Intake/Output from previous day:  Intake/Output Summary (Last 24 hours) at 09/29/11 0755 Last data filed at 09/29/11 4098  Gross per 24 hour  Intake  89.42 ml  Output    200 ml  Net -110.58 ml    Physical Exam: Affect appropriate Healthy:  appears stated age HEENT: normal Neck supple with no adenopathy JVP normal no bruits no thyromegaly Lungs clear with no wheezing and good diaphragmatic motion Heart:  S1/S2 no murmur, no rub, gallop or click PMI normal Abdomen: benighn, BS positve, no tenderness, no AAA no bruit.  No HSM or HJR Distal pulses intact with no bruits No edema Neuro non-focal Skin warm and dry No muscular weakness   Lab Results: Basic Metabolic Panel:  Basename 09/28/11 2140  NA 140  K 3.6  CL 103  CO2 26  GLUCOSE 171*  BUN 20  CREATININE 0.72  CALCIUM 9.7  MG 2.2  PHOS 3.3   Liver Function Tests: No results found for this basename: AST:2,ALT:2,ALKPHOS:2,BILITOT:2,PROT:2,ALBUMIN:2 in the last 72 hours No results found for this basename: LIPASE:2,AMYLASE:2 in the last 72 hours CBC:  Basename 09/29/11 0645 09/28/11 2140  WBC 9.8 11.7*  NEUTROABS -- 6.4  HGB 15.1 15.7  HCT 44.5 45.2  MCV 86.1 85.1  PLT 166 200   Cardiac Enzymes:  Basename 09/28/11 2141  CKTOTAL --  CKMB --  CKMBINDEX --  TROPONINI <0.30    Imaging: Dg Chest Port 1 View  09/28/2011  *RADIOLOGY REPORT*  Clinical Data: Chest pain.  Intra fibrillation.   High blood pressure.  Diabetic.  Nonischemic cardiomyopathy.  PORTABLE CHEST - 1 VIEW  Comparison: 08/17/2011.  Findings: Mild central pulmonary vascular prominence. No infiltrate, congestive heart failure or pneumothorax.  The patient would eventually benefit from follow-up two-view chest with cardiac leads removed.  Heart size within normal limits.  Mildly tortuous aorta.  Prior cervical spine surgery.  IMPRESSION: Mild central pulmonary vascular prominence.  Please see above.   Original Report Authenticated By: Fuller Canada, M.D.     Cardiac Studies:  ECG:    Telemetry: afib rate 129    Echo:   Medications:     . amiodarone  150 mg Intravenous Once  . digoxin  0.5 mg Intravenous Once  . digoxin  250 mcg Oral Daily  . diltiazem  20 mg Intravenous Once  . diltiazem (CARDIZEM) infusion  5-15 mg/hr Intravenous Once  . diltiazem  20 mg Intravenous Once  . heparin  5,000 Units Intravenous Once  . metoprolol      . metoprolol  2.5 mg Intravenous Q5 min  . multivitamin with minerals  1 tablet Oral Daily  . pantoprazole  40 mg Oral Q1200  . sodium chloride  500 mL Intravenous Once  . sodium chloride  3 mL Intravenous Q12H  .  DISCONTD: metoprolol  5 mg Intravenous Q10 min       . amiodarone (NEXTERONE PREMIX) 360 mg/200 mL dextrose 1 mg/min (09/29/11 0519)   Followed by  . amiodarone (NEXTERONE PREMIX) 360 mg/200 mL dextrose    . heparin 1,750 Units/hr (09/28/11 2358)  . DISCONTD: diltiazem (CARDIZEM) infusion 20 mg/hr (09/29/11 0107)    Assessment/Plan:  Afib:  Continue heparin and give xarelto  [facility] today since Afib is less than 24 hours continue cardizem  D/C amiodarone post [facility] And consider sotolol DCM:  Stable with no signs of CHF EF 30% by echo 7/13  Charlton Haws 09/29/2011, 7:55 AM

## 2011-09-29 NOTE — Progress Notes (Signed)
ANTICOAGULATION CONSULT NOTE - Follow Up Consult  Pharmacy Consult for  Heparin to Xarelto Indication: atrial fibrillation  No Known Allergies  Patient Measurements: Height: 6\' 4"  (193 cm) Weight: 283 lb 8.2 oz (128.6 kg) IBW/kg (Calculated) : 86.8  Heparin Dosing Weight: 115 kg  Vital Signs: Temp: 97.8 F (36.6 C) (09/04 0624) Temp src: Oral (09/04 0624) BP: 104/71 mmHg (09/04 0624) Pulse Rate: 143  (09/04 0624)  Labs:  Basename 09/29/11 0645 09/28/11 2141 09/28/11 2140  HGB 15.1 -- 15.7  HCT 44.5 -- 45.2  PLT 166 -- 200  APTT -- -- 27  LABPROT 14.3 -- 13.0  INR 1.09 -- 0.96  HEPARINUNFRC 0.24* -- --  CREATININE 0.75 -- 0.72  CKTOTAL -- -- --  CKMB -- -- --  TROPONINI -- <0.30 --    Estimated Creatinine Clearance: 163.5 ml/min (by C-G formula based on Cr of 0.75).  Assessment:   Heparin level on 1750 units/hr is 0.24, subtherapeutic.   Changing to Xarelto 20 mg PO daily, first dose just given.  Discussed with Dr. Eden Emms.  Full-dose anticoagulation should be achieved with Xarelto within 2-4 hours.  Goal of Therapy:  Heparin level 0.3-0.7 units/ml full anticoagulation with Xarelto Monitor platelets by anticoagulation protocol: Yes   Plan:   Xarelto 20 mg PO daily.  First dose given, subsequent doses with evening meal.   Discontinue Heparin drip at 12noon today, ~ 3 hours after 1st Xarelto dose.  Dennie Fetters, [place] Pager: [phone] 09/29/2011,8:49 AM

## 2011-09-29 NOTE — Anesthesia Preprocedure Evaluation (Addendum)
Anesthesia Evaluation  Patient identified by MRN, date of birth, ID band Patient awake    Reviewed: Allergy & Precautions, H&P , NPO status , Patient's Chart, lab work & pertinent test results  History of Anesthesia Complications Negative for: history of anesthetic complications  Airway Mallampati: II TM Distance: >3 FB Neck ROM: Full    Dental  (+) Dental Advisory Given   Pulmonary          Cardiovascular hypertension, + dysrhythmias Atrial Fibrillation     Neuro/Psych    GI/Hepatic Neg liver ROS, GERD-  Medicated and Controlled,  Endo/Other  diabetes  Renal/GU negative Renal ROS  negative genitourinary   Musculoskeletal negative musculoskeletal ROS (+)   Abdominal   Peds  Hematology negative hematology ROS (+)   Anesthesia Other Findings   Reproductive/Obstetrics                          Anesthesia Physical Anesthesia Plan  ASA: III  Anesthesia Plan: General   Post-op Pain Management:    Induction: Intravenous  Airway Management Planned: Mask  Additional Equipment:   Intra-op Plan:   Post-operative Plan:   Informed Consent:   Dental advisory given  Plan Discussed with: Anesthesiologist and CRNA  Anesthesia Plan Comments:        Anesthesia Quick Evaluation

## 2011-09-30 ENCOUNTER — Encounter (HOSPITAL_COMMUNITY): Payer: Self-pay | Admitting: Nurse Practitioner

## 2011-09-30 DIAGNOSIS — E119 Type 2 diabetes mellitus without complications: Secondary | ICD-10-CM

## 2011-09-30 DIAGNOSIS — E1159 Type 2 diabetes mellitus with other circulatory complications: Secondary | ICD-10-CM

## 2011-09-30 DIAGNOSIS — E1165 Type 2 diabetes mellitus with hyperglycemia: Secondary | ICD-10-CM

## 2011-09-30 MED ORDER — PNEUMOCOCCAL VAC POLYVALENT 25 MCG/0.5ML IJ INJ
0.5000 mL | INJECTION | Freq: Once | INTRAMUSCULAR | Status: AC
  Administered 2011-09-30: 0.5 mL via INTRAMUSCULAR
  Filled 2011-09-30: qty 0.5

## 2011-09-30 MED ORDER — CARVEDILOL 25 MG PO TABS: 12.5000 mg | ORAL_TABLET | Freq: Two times a day (BID) | ORAL | Status: DC

## 2011-09-30 MED ORDER — RIVAROXABAN 20 MG PO TABS: 20.0000 mg | ORAL_TABLET | Freq: Every day | ORAL | Status: DC

## 2011-09-30 NOTE — Progress Notes (Signed)
Patient given discharge instructions, all questions answered. [facility] [place] [place] Patient discharged home with wife.

## 2011-09-30 NOTE — Progress Notes (Signed)
Patient wants note for work

## 2011-09-30 NOTE — Progress Notes (Signed)
Patient ID: Aaron Thompson, male   DOB: 1960/04/24, 51 y.o.   MRN: [id number]    Subjective:  Denies SSCP, palpitations or Dyspnea Better after [facility]   Objective:  Filed Vitals:   09/29/11 1420 09/29/11 1525 09/29/11 2047 09/30/11 0411  BP: 115/85 104/68 125/79 118/81  Pulse: 100 76 75 82  Temp:   97.1 F (36.2 C) 96.2 F (35.7 C)  TempSrc:   Oral Oral  Resp:   18 18  Height:      Weight:      SpO2: 97% 98% 100% 98%    Intake/Output from previous day:  Intake/Output Summary (Last 24 hours) at 09/30/11 0754 Last data filed at 09/29/11 2235  Gross per 24 hour  Intake      3 ml  Output   1000 ml  Net   -997 ml    Physical Exam: Affect appropriate Healthy:  appears stated age HEENT: normal Neck supple with no adenopathy JVP normal no bruits no thyromegaly Lungs clear with no wheezing and good diaphragmatic motion Heart:  S1/S2 no murmur, no rub, gallop or click PMI normal Abdomen: benighn, BS positve, no tenderness, no AAA no bruit.  No HSM or HJR Distal pulses intact with no bruits No edema Neuro non-focal Skin warm and dry No muscular weakness   Lab Results: Basic Metabolic Panel:  Basename 09/29/11 0645 09/28/11 2140  NA 141 140  K 3.8 3.6  CL 104 103  CO2 25 26  GLUCOSE 157* 171*  BUN 19 20  CREATININE 0.75 0.72  CALCIUM 8.8 9.7  MG -- 2.2  PHOS -- 3.3   Liver Function Tests: No results found for this basename: AST:2,ALT:2,ALKPHOS:2,BILITOT:2,PROT:2,ALBUMIN:2 in the last 72 hours No results found for this basename: LIPASE:2,AMYLASE:2 in the last 72 hours CBC:  Basename 09/29/11 0645 09/28/11 2140  WBC 9.8 11.7*  NEUTROABS -- 6.4  HGB 15.1 15.7  HCT 44.5 45.2  MCV 86.1 85.1  PLT 166 200   Cardiac Enzymes:  Basename 09/28/11 2141  CKTOTAL --  CKMB --  CKMBINDEX --  TROPONINI <0.30    Imaging: Dg Chest Port 1 View  09/28/2011  *RADIOLOGY REPORT*  Clinical Data: Chest pain.  Intra fibrillation.  High blood pressure.  Diabetic.   Nonischemic cardiomyopathy.  PORTABLE CHEST - 1 VIEW  Comparison: 08/17/2011.  Findings: Mild central pulmonary vascular prominence. No infiltrate, congestive heart failure or pneumothorax.  The patient would eventually benefit from follow-up two-view chest with cardiac leads removed.  Heart size within normal limits.  Mildly tortuous aorta.  Prior cervical spine surgery.  IMPRESSION: Mild central pulmonary vascular prominence.  Please see above.   Original Report Authenticated By: Fuller Canada, M.D.     Cardiac Studies:  ECG:    Telemetry: NSR rates in 80's 09/30/2011   Echo:   Medications:      . digoxin  250 mcg Oral Daily  . multivitamin with minerals  1 tablet Oral Daily  . pantoprazole  40 mg Oral Q1200  . rivaroxaban  20 mg Oral Q supper  . sodium chloride  3 mL Intravenous Q12H        . amiodarone (NEXTERONE PREMIX) 360 mg/200 mL dextrose 1 mg/min (09/29/11 0824)  . DISCONTD: amiodarone (NEXTERONE PREMIX) 360 mg/200 mL dextrose Stopped (09/29/11 1550)  . DISCONTD: heparin Stopped (09/29/11 1139)    Assessment/Plan:  Afib:  S/P DCC Discussed use of sotolol but episodes PAF and palpitations not that frequent  D/C with coreg 12.5 given  soft BP And consider sotolol with next recurrence that requires [facility]  D/C amiodarone Keep on xarelto  DCM:  Stable with no signs of CHF EF 30% by echo 7/13 resume ARB/HCTZ and digoxen  D/C home F/U with me 8 weeks  Charlton Haws 09/30/2011, 7:54 AM

## 2011-09-30 NOTE — Discharge Summary (Signed)
Patient ID: Aaron Thompson,  MRN: [id number], DOB/AGE: 50-Oct-1962 50 y.o.  Admit date: 09/28/2011 Discharge date: 09/30/2011  Primary Care Provider: [facility] Primary Cardiologist: P. Eden Emms, MD  Discharge Diagnoses Principal Problem:  *Atrial fibrillation   **s/p DCCV with Xarelto initiation this admission. Active Problems:  Nonischemic cardiomyopathy  HYPERTENSION  DM II (diabetes mellitus, type II), diet-controlled  Allergies No Known Allergies  Procedures  09/29/2011 Cardioversion  Successful DCCV using 200 Joules.  History of Present Illness  51 year old male with prior history of paroxysmal atrial fibrillation and nonischemic cardiomyopathy who was in his usual state of health until the evening of admission when he had sudden onset of irregular tachypalpitations associated with dyspnea and mild chest discomfort. His wife drove him to the [facility] [place] [place] where he was found to be in atrial fibrillation with rapid ventricular response. There, he history with intravenous metoprolol and diltiazem with minimal response. He was seen by cardiology and placed on intravenous heparin and amiodarone and subsequently admitted for further evaluation.  Hospital Course  Patient ruled out for myocardial infarction. He remained in atrial fibrillation despite antiarrhythmic therapy.  Given clear onset with relatively short duration of atrial fibrillation, decision was made to perform cardioversion. Patient was initiated on Xarelto therapy and heparin was subsequently discontinued. On September 4, he underwent successful cardioversion using 1 synchronized 200 J shock. With restoration of sinus rhythm, amiodarone was discontinued. He has been maintained on beta blocker therapy and has had no recurrence of atrial fibrillation. It should be noted that his blood pressure has been somewhat soft and as a result his carvedilol dose has been reduced to 12.5 mg twice a day. He will be discharged home today in good  condition.  Discharge Vitals Blood pressure 124/78, pulse 81, temperature 97.9 F (36.6 C), temperature source Oral, resp. rate 18, height 6\' 4"  (1.93 m), weight 283 lb 8.2 oz (128.6 kg), SpO2 99.00%.  Filed Weights   09/28/11 2300 09/29/11 0030  Weight: 284 lb (128.822 kg) 283 lb 8.2 oz (128.6 kg)    Labs  CBC  Basename 09/29/11 0645 09/28/11 2140  WBC 9.8 11.7*  NEUTROABS -- 6.4  HGB 15.1 15.7  HCT 44.5 45.2  MCV 86.1 85.1  PLT 166 200   Basic Metabolic Panel  Basename 09/29/11 0645 09/28/11 2140  NA 141 140  K 3.8 3.6  CL 104 103  CO2 25 26  GLUCOSE 157* 171*  BUN 19 20  CREATININE 0.75 0.72  CALCIUM 8.8 9.7  MG -- 2.2  PHOS -- 3.3   Cardiac Enzymes  Basename 09/28/11 2141  CKTOTAL --  CKMB --  CKMBINDEX --  TROPONINI <0.30   Disposition  Pt is being discharged home today in good condition.  Follow-up Plans & Appointments  Follow-up Information    Follow up with Charlton Haws, MD on 11/30/2011. (9:30 AM)    Contact information:   [address], [address] [place] [address] [phone]       Follow up with Roxy Manns, MD. (as scheduled.)    Contact information:   [address] [place] [address] [place] [address] [phone]         Discharge Medications  Medication List  As of 09/30/2011  3:08 PM   STOP taking these medications         aspirin 325 MG tablet         TAKE these medications  carvedilol 25 MG tablet   Commonly known as: COREG   Take 0.5 tablets (12.5 mg total) by mouth 2 (two) times daily with a meal.      digoxin 0.25 MG tablet   Commonly known as: LANOXIN   Take 250 mcg by mouth daily.      losartan-hydrochlorothiazide 50-12.5 MG per tablet   Commonly known as: HYZAAR   Take 1 tablet by mouth daily.      multivitamin tablet   Take 1 tablet by mouth daily.      Rivaroxaban 20 MG Tabs   Commonly known as: XARELTO   Take 1 tablet  (20 mg total) by mouth daily with supper.      ZEGERID OTC 20-1100 MG Caps   Generic drug: Omeprazole-Sodium Bicarbonate   Take 1 capsule by mouth daily.           Outstanding Labs/Studies  None  Duration of Discharge Encounter   Greater than 30 minutes including physician time.  Signed, Nicolasa Ducking NP 09/30/2011, 3:08 PM

## 2011-10-01 ENCOUNTER — Telehealth: Payer: Self-pay | Admitting: *Deleted

## 2011-10-01 NOTE — Telephone Encounter (Signed)
CALLED PT RE MONITOR RESULTS PER DR NISHAN  NSR NO PAF  LBBB PT AWARE./CY

## 2011-10-04 ENCOUNTER — Telehealth: Payer: Self-pay | Admitting: Cardiovascular Disease

## 2011-10-04 MED ORDER — METHOCARBAMOL 750 MG PO TABS: 750.0000 mg | ORAL_TABLET | Freq: Three times a day (TID) | ORAL | Status: AC
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
## 2011-10-04 NOTE — Telephone Encounter (Signed)
Can try robaxiin 750 tid

## 2011-10-04 NOTE — Telephone Encounter (Signed)
PT'S WIFE AWARE  MED  [facility]CY

## 2011-10-04 NOTE — Telephone Encounter (Signed)
New Problem:    Patient's wife called in because he  Has a muscle that is hurting him underneath his left shoulder blade since his cardioversion and would like to see if he could receive a muscle relaxer.  Please call back, patient would like a call back today.

## 2011-10-12 NOTE — Anesthesia Postprocedure Evaluation (Signed)
  Anesthesia Post-op Note  Patient: Aaron Thompson  Procedure(s) Performed: Procedure(s) (LRB) with comments: CARDIOVERSION ([name])  Patient Location: PACU and Short Stay  Anesthesia Type: MAC  Level of Consciousness: awake  Airway and Oxygen Therapy: Patient Spontanous Breathing  Post-op Pain: mild  Post-op Assessment: Post-op Vital signs reviewed  Post-op Vital Signs: Reviewed  Complications: No apparent anesthesia complications

## 2011-11-30 ENCOUNTER — Encounter: Payer: Self-pay | Admitting: Cardiovascular Disease

## 2011-11-30 ENCOUNTER — Encounter: Payer: Self-pay | Admitting: *Deleted

## 2011-11-30 ENCOUNTER — Ambulatory Visit (INDEPENDENT_AMBULATORY_CARE_PROVIDER_SITE_OTHER): Payer: 59 | Admitting: Cardiovascular Disease

## 2011-11-30 VITALS — BP 133/91 | HR 80 | Wt 288.0 lb

## 2011-11-30 DIAGNOSIS — I4891 Unspecified atrial fibrillation: Secondary | ICD-10-CM

## 2011-11-30 DIAGNOSIS — I43 Cardiomyopathy in diseases classified elsewhere: Secondary | ICD-10-CM

## 2011-11-30 DIAGNOSIS — I10 Essential (primary) hypertension: Secondary | ICD-10-CM

## 2011-11-30 NOTE — Assessment & Plan Note (Signed)
Functional class one F/U echo in a year.  Euvolemic EF 30% by echo in 7/13

## 2011-11-30 NOTE — Assessment & Plan Note (Signed)
Well controlled.  Continue current medications and low sodium Dash type diet.    

## 2011-11-30 NOTE — Progress Notes (Signed)
Patient ID: Aaron Thompson, male   DOB: 02-Feb-1960, 51 y.o.   MRN: [id number] 51 year old male with prior history of paroxysmal atrial fibrillation and nonischemic cardiomyopathy   Recent admission to hospital for rapid afib 9/13  Patient ruled out for myocardial infarction. He remained in atrial fibrillation despite antiarrhythmic therapy. Given clear onset with relatively short duration of atrial fibrillation, decision was made to perform cardioversion. Patient was initiated on Xarelto therapy and heparin was subsequently discontinued. On September 4, he underwent successful cardioversion using 1 synchronized 200 J shock. With restoration of sinus rhythm, amiodarone was discontinued. He has been maintained on beta blocker therapy and has had no recurrence of atrial fibrillation. It should be noted that his blood pressure has been somewhat soft and as a result his carvedilol dose has been reduced to 12.5 mg twice a day. He will be discharged home today in good condition.  Doing well since d/c  No bleeding issues no palpitations  He is sensitive to iv cardizem and tends to bottom pressure.    ROS: Denies fever, malais, weight loss, blurry vision, decreased visual acuity, cough, sputum, SOB, hemoptysis, pleuritic pain, palpitaitons, heartburn, abdominal pain, melena, lower extremity edema, claudication, or rash.  All other systems reviewed and negative  General: Affect appropriate Healthy:  appears stated age HEENT: normal Neck supple with no adenopathy JVP normal no bruits no thyromegaly Lungs clear with no wheezing and good diaphragmatic motion Heart:  S1/S2 no murmur, no rub, gallop or click PMI normal Abdomen: benighn, BS positve, no tenderness, no AAA no bruit.  No HSM or HJR Distal pulses intact with no bruits No edema Neuro non-focal Skin warm and dry No muscular weakness   Current Outpatient Prescriptions  Medication Sig Dispense Refill  . carvedilol (COREG) 25 MG tablet Take 0.5  tablets (12.5 mg total) by mouth 2 (two) times daily with a meal.  60 tablet  8  . digoxin (LANOXIN) 0.25 MG tablet Take 250 mcg by mouth daily.      [name] losartan-hydrochlorothiazide (HYZAAR) 50-12.5 MG per tablet Take 1 tablet by mouth daily.      . Multiple Vitamin (MULTIVITAMIN) tablet Take 1 tablet by mouth daily.        Maxwell Caul Bicarbonate (ZEGERID OTC) 20-1100 MG CAPS Take 1 capsule by mouth daily.       . Rivaroxaban (XARELTO) 20 MG TABS Take 1 tablet (20 mg total) by mouth daily with supper.  30 tablet  6    Allergies  Review of patient's allergies indicates no known allergies.  Electrocardiogram: Event monitor 9/6 reviewed no PAF NSR  Assessment and Plan

## 2011-11-30 NOTE — Patient Instructions (Signed)
Your physician wants you to follow-up in:  6 MONTHS WITH DR NISHAN  You will receive a reminder letter in the mail two months in advance. If you don't receive a letter, please call our office to schedule the follow-up appointment. Your physician recommends that you continue on your current medications as directed. Please refer to the Current Medication list given to you today. 

## 2011-11-30 NOTE — Assessment & Plan Note (Signed)
Maint NSR continue xarelto given frequent recurrences

## 2012-03-22 ENCOUNTER — Other Ambulatory Visit: Payer: Self-pay

## 2012-03-22 MED ORDER — CARVEDILOL 25 MG PO TABS: 12.5000 mg | ORAL_TABLET | Freq: Two times a day (BID) | ORAL | Status: DC

## 2012-03-22 NOTE — Telephone Encounter (Signed)
..   Requested Prescriptions   Signed Prescriptions Disp Refills  . carvedilol (COREG) 25 MG tablet 60 tablet 8    Sig: Take 0.5 tablets (12.5 mg total) by mouth 2 (two) times daily with a meal.    Authorizing Provider: Lewayne Bunting    Ordering User: Christella Hartigan, Sarahmarie Leavey Judie Petit

## 2012-03-27 ENCOUNTER — Ambulatory Visit: Payer: 59 | Admitting: Family Medicine

## 2012-03-28 ENCOUNTER — Encounter: Payer: Self-pay | Admitting: Family Medicine

## 2012-03-28 ENCOUNTER — Ambulatory Visit (INDEPENDENT_AMBULATORY_CARE_PROVIDER_SITE_OTHER): Payer: 59 | Admitting: Family Medicine

## 2012-03-28 VITALS — BP 112/74 | HR 106 | Temp 97.9°F | Ht 76.0 in | Wt 287.8 lb

## 2012-03-28 DIAGNOSIS — J019 Acute sinusitis, unspecified: Secondary | ICD-10-CM

## 2012-03-28 DIAGNOSIS — B9689 Other specified bacterial agents as the cause of diseases classified elsewhere: Secondary | ICD-10-CM

## 2012-03-28 MED ORDER — AMOXICILLIN-POT CLAVULANATE 875-125 MG PO TABS: 1.0000 | ORAL_TABLET | Freq: Two times a day (BID) | ORAL | Status: DC

## 2012-03-28 MED ORDER — GUAIFENESIN-CODEINE 100-10 MG/5ML PO SYRP: 5.0000 mL | ORAL_SOLUTION | Freq: Every evening | ORAL | Status: DC | PRN

## 2012-03-28 NOTE — Patient Instructions (Addendum)
I think you have a cold and a sinus infection Take the augmentin as directed- I sent this to your pharmacy Drink lots of fluids and try to get some rest  Try the codeine cough medicine at night  Update if not starting to improve in a week or if worsening

## 2012-03-28 NOTE — Progress Notes (Signed)
Subjective:    Patient ID: Aaron Thompson, male    DOB: Jul 07, 1960, 52 y.o.   MRN: [id number]  HPI Has been sick for about a week- worse for 4 days (working a lot lately)  Cough is bad - green sputum and this is keeping him up all night  No fever or chills  Also nasal drainage - green with blood  A little sinus pain  Ears are popping and stopped up   Throat is sore  Chest is sore from coughing   chlorcedin hbp and mucinex otc    Patient Active Problem List  Diagnosis  . TINEA CORPORIS  . COLONIC POLYPS  . ANXIETY  . HEARING IMPAIRMENT  . HYPERTENSION  . CARDIOMYOPATHY OTHER DISEASES CLASSIFIED ELSW  . Atrial fibrillation  . GERD  . FATTY LIVER DISEASE  . RENAL CALCULUS  . FOLLICULITIS  . OSTEOARTHRITIS  . OSTEOARTHROSIS UNSPEC WHETHER GEN/LOCALIZED HAND  . ABDOMINAL PAIN, RIGHT UPPER QUADRANT  . Palpitations  . Type II or unspecified type diabetes mellitus without mention of complication, not stated as uncontrolled  . AGE (acute gastroenteritis)  . Chest pain on exertion  . Unstable angina  . Nonischemic cardiomyopathy  . NSVT (nonsustained ventricular tachycardia)  . DM II (diabetes mellitus, type II), controlled   Past Medical History  Diagnosis Date  . Hypertension   . DJD (degenerative joint disease)   . Dermatophytosis of the body   . Calculus of kidney   . Esophageal reflux   . Other chronic nonalcoholic liver disease     fatty liver  . Unspecified hearing loss   . Anxiety state, unspecified   . Non-ischemic cardiomyopathy     a.  cath 6/11: OM2 30%; EF 30%;  b. echo 2/12: EF 30%, mild MR, mild LAE  . Atrial fibrillation     a. s/p DCCV 6/11; previously on Pradaxa;  b. Event Monitor 2012->No PAF;  c. 09/2011 s/p DCCV ->Xarelto started.  [name] LBBB (left bundle branch block)     intermittent  . Hx of colonic polyps   . Osteoarthrosis, unspecified whether generalized or localized, hand   . Diarrhea   . Gastroenteritis   . Abdominal pain, right upper  quadrant   . Folliculitis   . Diabetes mellitus    Past Surgical History  Procedure Laterality Date  . Neck surgery      plate/fusion  . Bilateral knee arthroscopy    . Cardioversion  06/2009    Non ischemic cardiomyopathy - cath and echo  . Stress cardiolite  12/02/1998    Normal EF 52%  . Stress cardiolite  08/2005    Normal, EF 42%  . Cardiovascular stress test  2006    Neg per pt  . Upper gastrointestinal endoscopy  08/2006    GERD  . Abdominal US  01/2007    Fatty liver, no gallstones  . Doppler echocardiography  11/2009    Decreased EF of 35-40%  . Appendectomy    . Cardioversion  09/29/2011    Procedure: CARDIOVERSION;  Surgeon: Vesta Mixer, MD;  Location: [facility] OR;  Service: Cardiovascular;  Laterality: [name];   History  Substance Use Topics  . Smoking status: Never Smoker   . Smokeless tobacco: Not on file  . Alcohol Use: No   Family History  Problem Relation Age of Onset  . Hypertension Mother   . Diabetes Mother   . Hypertension Father   . Diabetes Father   . Diabetes Brother   .  Kidney disease Brother   . Diabetes Brother   . Heart failure Mother     Died @ 65  . Heart failure Father     Died @ 34  . Other Sister     4 sisters A&W   No Known Allergies Current Outpatient Prescriptions on File Prior to Visit  Medication Sig Dispense Refill  . carvedilol (COREG) 25 MG tablet Take 0.5 tablets (12.5 mg total) by mouth 2 (two) times daily with a meal.  60 tablet  8  . digoxin (LANOXIN) 0.25 MG tablet Take 250 mcg by mouth daily.      [name] losartan-hydrochlorothiazide (HYZAAR) 50-12.5 MG per tablet Take 1 tablet by mouth daily.      . Multiple Vitamin (MULTIVITAMIN) tablet Take 1 tablet by mouth daily.        Maxwell Caul Bicarbonate (ZEGERID OTC) 20-1100 MG CAPS Take 1 capsule by mouth daily.       . Rivaroxaban (XARELTO) 20 MG TABS Take 1 tablet (20 mg total) by mouth daily with supper.  30 tablet  6   No current facility-administered medications on  file prior to visit.    Review of Systems Review of Systems  Constitutional: Negative for fever, appetite change,  and unexpected weight change.  ENT neg for ear pain or drainage Eyes: Negative for pain and visual disturbance. neg for eye redness or itching  Respiratory: Negative for wheeze and shortness of breath.   Cardiovascular: Negative for cp or palpitations    Gastrointestinal: Negative for nausea, diarrhea and constipation.  Genitourinary: Negative for urgency and frequency.  Skin: Negative for pallor or rash   Neurological: Negative for weakness, light-headedness, numbness and headaches.  Hematological: Negative for adenopathy. Does not bruise/bleed easily.  Psychiatric/Behavioral: Negative for dysphoric mood. The patient is not nervous/anxious.         Objective:   Physical Exam  Constitutional: He appears well-developed and well-nourished. No distress.  HENT:  Head: Normocephalic and atraumatic.  Right Ear: External ear normal.  Left Ear: External ear normal.  Mouth/Throat: Oropharynx is clear and moist.  Nares are injected and congested  bilat frontal and maxillary sinus tenderness Throat clear post nasal drip  Eyes: Conjunctivae and EOM are normal. Pupils are equal, round, and reactive to light. Right eye exhibits no discharge. Left eye exhibits no discharge.  Neck: Normal range of motion. Neck supple.  Cardiovascular: Normal rate and regular rhythm.   Pulmonary/Chest: Effort normal and breath sounds normal. No respiratory distress. He has no wheezes. He has no rales. He exhibits no tenderness.  Lymphadenopathy:    He has no cervical adenopathy.  Neurological: He is alert.  Skin: Skin is warm and dry. No rash noted.  Psychiatric: He has a normal mood and affect.          Assessment & Plan:

## 2012-03-28 NOTE — Assessment & Plan Note (Signed)
S/p uri with sinus tenderness and congestion with purulent nasal drainage tx with augmentin Robitussin with codiene for pm cough Disc symptomatic care - see instructions on AVS  Update if not starting to improve in a week or if worsening

## 2012-04-10 ENCOUNTER — Other Ambulatory Visit: Payer: Self-pay | Admitting: *Deleted

## 2012-04-10 MED ORDER — DIGOXIN 250 MCG PO TABS: 250.0000 ug | ORAL_TABLET | Freq: Every day | ORAL | Status: DC

## 2012-04-19 ENCOUNTER — Other Ambulatory Visit: Payer: Self-pay

## 2012-04-19 MED ORDER — PROMETHAZINE HCL 25 MG PO TABS: 25.0000 mg | ORAL_TABLET | Freq: Three times a day (TID) | ORAL | Status: DC | PRN

## 2012-04-19 NOTE — Telephone Encounter (Signed)
Pt notified Rx sent to pharmacy and Dr. Royden Purl comments and recommendations, pt verbalized understanding

## 2012-04-19 NOTE — Telephone Encounter (Signed)
I sent it in to pharmacy- continue sips of fluids and when ready - BRAT diet - slowly  If dizzy/ rapid heartbeat- go to ER If abdominal pain or high fever let me know Wipe down surfaces with bleach wipes as the virus going around is very contagious

## 2012-04-19 NOTE — Telephone Encounter (Signed)
Pt's wife said pt started with vomiting and diarrhea last night at 10 pm. Pt keeps liquid down for 30 mins so Mrs Zurn request Phenergan called [facility]. Pt has history of when vomiting causing pt's heart to go out of rhythm; now pt still has normal heart rate. Pt is able to keep some fluids and jello down.Please advise. Pt's wife request call back when med called in; prefers not to come to office for appt.

## 2012-05-18 ENCOUNTER — Encounter: Payer: Self-pay | Admitting: Internal Medicine

## 2012-06-05 ENCOUNTER — Other Ambulatory Visit (HOSPITAL_COMMUNITY): Payer: Self-pay | Admitting: Nurse Practitioner

## 2012-06-22 ENCOUNTER — Other Ambulatory Visit (HOSPITAL_COMMUNITY): Payer: Self-pay | Admitting: Nurse Practitioner

## 2012-08-19 ENCOUNTER — Other Ambulatory Visit (HOSPITAL_COMMUNITY): Payer: Self-pay | Admitting: Physician Assistant

## 2012-09-21 ENCOUNTER — Other Ambulatory Visit: Payer: Self-pay

## 2012-09-21 MED ORDER — LOSARTAN POTASSIUM-HCTZ 50-12.5 MG PO TABS: ORAL_TABLET | ORAL | Status: DC

## 2012-10-03 ENCOUNTER — Other Ambulatory Visit (HOSPITAL_COMMUNITY): Payer: Self-pay | Admitting: Cardiovascular Disease

## 2012-10-18 ENCOUNTER — Other Ambulatory Visit: Payer: Self-pay | Admitting: Cardiovascular Disease

## 2012-11-22 ENCOUNTER — Other Ambulatory Visit: Payer: Self-pay | Admitting: Cardiovascular Disease

## 2012-11-29 ENCOUNTER — Encounter (INDEPENDENT_AMBULATORY_CARE_PROVIDER_SITE_OTHER): Payer: Self-pay

## 2012-11-29 ENCOUNTER — Encounter: Payer: Self-pay | Admitting: Physician Assistant

## 2012-11-29 ENCOUNTER — Ambulatory Visit (INDEPENDENT_AMBULATORY_CARE_PROVIDER_SITE_OTHER): Payer: 59 | Admitting: Physician Assistant

## 2012-11-29 VITALS — BP 130/80 | HR 92 | Ht 76.0 in | Wt 295.0 lb

## 2012-11-29 DIAGNOSIS — I10 Essential (primary) hypertension: Secondary | ICD-10-CM

## 2012-11-29 DIAGNOSIS — I4891 Unspecified atrial fibrillation: Secondary | ICD-10-CM

## 2012-11-29 DIAGNOSIS — I428 Other cardiomyopathies: Secondary | ICD-10-CM

## 2012-11-29 DIAGNOSIS — I2510 Atherosclerotic heart disease of native coronary artery without angina pectoris: Secondary | ICD-10-CM

## 2012-11-29 MED ORDER — CARVEDILOL 12.5 MG PO TABS: 18.7500 mg | ORAL_TABLET | Freq: Two times a day (BID) | ORAL | Status: DC

## 2012-11-29 MED ORDER — LOSARTAN POTASSIUM-HCTZ 50-12.5 MG PO TABS: 1.0000 | ORAL_TABLET | Freq: Every day | ORAL | Status: DC

## 2012-11-29 MED ORDER — DIGOXIN 250 MCG PO TABS: 250.0000 ug | ORAL_TABLET | Freq: Every day | ORAL | Status: DC

## 2012-11-29 MED ORDER — RIVAROXABAN 20 MG PO TABS: 20.0000 mg | ORAL_TABLET | Freq: Every day | ORAL | Status: DC

## 2012-11-29 NOTE — Patient Instructions (Addendum)
Your physician has requested that you have an echocardiogram. Echocardiography is a painless test that uses sound waves to create images of your heart. It provides your doctor with information about the size and shape of your heart and how well your heart's chambers and valves are working. This procedure takes approximately one hour. There are no restrictions for this procedure.  Your physician wants you to follow-up in: 6 MONTHS WITH DR. Haywood Filler will receive a reminder letter in the mail two months in advance. If you don't receive a letter, please call our office to schedule the follow-up appointment.  Your physician recommends that you return for lab work in: CBC AND BMET

## 2012-11-29 NOTE — Progress Notes (Signed)
[address], [address] [place], [place]  [address] Phone: [phone] Fax:  [phone]  Date:  11/29/2012   ID:  Aaron Thompson, DOB 1960/09/03, MRN [id number]  PCP:  Roxy Manns, MD  Cardiologist:  Dr. Charlton Haws     History of Present Illness: Aaron Thompson is a 52 y.o. male with a hx of NICM with EF 30%, non-obstructive CAD, paroxysmal AFib (s/p DCCV 06/2009 and 09/2011), HTN, T2DM, intermittent LBBB.  LHC (07/2011):  Left main 20%, mid LAD 20%, mid circumflex 20%, proximal RCA 20%, EF 45%.  Echo (07/2011): EF 30%, diffuse HK, grade 1 diastolic dysfunction, atrial lipomatous hypertrophy.  Last seen by Dr. Eden Emms 11/2011.  Doing well since last seen.  The patient denies chest pain, shortness of breath, syncope, orthopnea, PND or significant pedal edema.  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  He can climb the steps in the building without a problem.  He is NYHA Class I-II.   No palpitations.    Recent Labs: No results found for requested labs within last 365 days. 09/29/2011:  K 3.8, creatinine 0.75, Hgb 15.1  Wt Readings from Last 3 Encounters:  03/28/12 287 lb 12 oz (130.523 kg)  11/30/11 288 lb (130.636 kg)  09/29/11 283 lb 8.2 oz (128.6 kg)     Past Medical History  Diagnosis Date  . Hypertension   . DJD (degenerative joint disease)   . Dermatophytosis of the body   . Calculus of kidney   . Esophageal reflux   . Other chronic nonalcoholic liver disease     fatty liver  . Unspecified hearing loss   . Anxiety state, unspecified   . Non-ischemic cardiomyopathy     a.  cath 6/11: OM2 30%; EF 30%;  b. echo 2/12: EF 30%, mild MR, mild LAE  . Atrial fibrillation     a. s/p DCCV 6/11; previously on Pradaxa;  b. Event Monitor 2012->No PAF;  c. 09/2011 s/p DCCV ->Xarelto started.  [name] LBBB (left bundle branch block)     intermittent  . Hx of colonic polyps   . Osteoarthrosis, unspecified whether generalized or localized, hand   . Diarrhea   . Gastroenteritis   .  Abdominal pain, right upper quadrant   . Folliculitis   . Diabetes mellitus     Current Outpatient Prescriptions  Medication Sig Dispense Refill  . amoxicillin-clavulanate (AUGMENTIN) 875-125 MG per tablet Take 1 tablet by mouth 2 (two) times daily.  14 tablet  0  . carvedilol (COREG) 25 MG tablet Take 0.5 tablets (12.5 mg total) by mouth 2 (two) times daily with a meal.  60 tablet  8  . digoxin (LANOXIN) 0.25 MG tablet Take 1 tablet (250 mcg total) by mouth daily.  30 tablet  12  . guaiFENesin-codeine (ROBITUSSIN AC) 100-10 MG/5ML syrup Take 5 mLs by mouth at bedtime as needed for cough.  120 mL  0  . losartan-hydrochlorothiazide (HYZAAR) 50-12.5 MG per tablet TAKE 1 TABLET BY MOUTH DAILY.  15 tablet  0  . Multiple Vitamin (MULTIVITAMIN) tablet Take 1 tablet by mouth daily.        Maxwell Caul Bicarbonate (ZEGERID OTC) 20-1100 MG CAPS Take 1 capsule by mouth daily.       . promethazine (PHENERGAN) 25 MG tablet Take 1 tablet (25 mg total) by mouth every 8 (eight) hours as needed for nausea (sedation precautions).  20 tablet  0  . XARELTO 20 MG TABS tablet TAKE 1 TABLET BY  MOUTH DAILY WITH SUPPER  30 tablet  1   No current facility-administered medications for this visit.    Allergies:   Review of patient's allergies indicates no known allergies.   Social History:  The patient  reports that he has never smoked. He does not have any smokeless tobacco history on file. He reports that he does not drink alcohol or use illicit drugs.   Family History:  The patient's family history includes Diabetes in his brother, brother, father, and mother; Heart failure in his father and mother; Hypertension in his father and mother; Kidney disease in his brother; Other in his sister.   ROS:  Please see the history of present illness.   He has occasional hemorrhoidal bleeding.  Otherwise, no melena, hematochezia.   All other systems reviewed and negative.   PHYSICAL EXAM: VS:  BP 130/80  Pulse 92   Ht 6\' 4"  (1.93 m)  Wt 295 lb (133.811 kg)  BMI 35.92 kg/m2 Well nourished, well developed, in no acute distress HEENT: normal Neck: no JVD Vascular:  No carotid bruits Cardiac:  normal S1, S2; RRR; no murmur Lungs:  clear to auscultation bilaterally, no wheezing, rhonchi or rales Abd: soft, nontender, no hepatomegaly Ext: no edema Skin: warm and dry Neuro:  CNs 2-12 intact, no focal abnormalities noted  EKG:  NSR, HR 92, LBBB     ASSESSMENT AND PLAN:  1. Non-Ischemic Cardiomyopathy:  Volume is stable.  Continue beta blocker and ARB.  HR is borderline high.  Will increase Coreg to 18.75 bid.  Arrange follow up echo to reassess LVF. 2. Atrial Fibrillation:  Maintaining NSR.  CHADS2-VASc=3.  Continue Xarelto.  Check BMET and CBC today. 3. CAD:  Minimal plaque by prior cath.  No angina.  Continue statin. He is not on ASA as he is on Xarelto. 4. Hypertension:  Controlled.  5. Diabetes Mellitus:  F/u with primary care.  6. Disposition:  F/u with Dr. Charlton Haws in 6 mos.   Signed, Tereso Newcomer, PA-C  11/29/2012 2:28 PM

## 2012-11-30 LAB — CBC WITH DIFFERENTIAL/PLATELET
Basophils Absolute: 0 10*3/uL (ref 0.0–0.1)
Basophils Relative: 0.5 % (ref 0.0–3.0)
Eosinophils Absolute: 0.2 10*3/uL (ref 0.0–0.7)
Eosinophils Relative: 2.8 % (ref 0.0–5.0)
Hemoglobin: 16.2 g/dL (ref 13.0–17.0)
Lymphs Abs: 2.6 10*3/uL (ref 0.7–4.0)
MCHC: 34.4 g/dL (ref 30.0–36.0)
MCV: 84.4 fl (ref 78.0–100.0)
Monocytes Absolute: 0.9 10*3/uL (ref 0.1–1.0)
Neutro Abs: 4.2 10*3/uL (ref 1.4–7.7)
Neutrophils Relative %: 52.8 % (ref 43.0–77.0)
RBC: 5.57 Mil/uL (ref 4.22–5.81)
RDW: 14 % (ref 11.5–14.6)

## 2012-11-30 LAB — BASIC METABOLIC PANEL
CO2: 25 mEq/L (ref 19–32)
Chloride: 102 mEq/L (ref 96–112)
Creatinine, Ser: 0.9 mg/dL (ref 0.4–1.5)
Glucose, Bld: 250 mg/dL — ABNORMAL HIGH (ref 70–99)
Sodium: 136 mEq/L (ref 135–145)

## 2012-12-13 ENCOUNTER — Telehealth: Payer: Self-pay | Admitting: Cardiovascular Disease

## 2012-12-13 NOTE — Telephone Encounter (Signed)
New Problem:   Pt's wife states the pt has been having a lot of knee pain. Pt's wife is asking if the pt can take Valterin 75 mg with all his other meds. Please advise what meds pt can take.

## 2012-12-14 NOTE — Telephone Encounter (Signed)
Can take voltarin

## 2012-12-14 NOTE — Telephone Encounter (Signed)
WILL FORWARD  TO  DR Eden Emms FOR  REVIEW  PT  WOULD LIKE TO TAKE  VOLTAREN  FOR  ARTHRITIS  IF THAT IS OKAY./CY

## 2012-12-14 NOTE — Telephone Encounter (Signed)
PT'S WIFE AWARE, PT MAY TAKE   VOLTAREN./CY

## 2012-12-15 ENCOUNTER — Encounter: Payer: Self-pay | Admitting: Cardiology

## 2012-12-15 ENCOUNTER — Ambulatory Visit (HOSPITAL_COMMUNITY): Payer: 59 | Attending: Cardiology | Admitting: Radiology

## 2012-12-15 DIAGNOSIS — E669 Obesity, unspecified: Secondary | ICD-10-CM | POA: Insufficient documentation

## 2012-12-15 DIAGNOSIS — Z6835 Body mass index (BMI) 35.0-35.9, adult: Secondary | ICD-10-CM | POA: Insufficient documentation

## 2012-12-15 DIAGNOSIS — E119 Type 2 diabetes mellitus without complications: Secondary | ICD-10-CM | POA: Insufficient documentation

## 2012-12-15 DIAGNOSIS — I428 Other cardiomyopathies: Secondary | ICD-10-CM

## 2012-12-15 DIAGNOSIS — I10 Essential (primary) hypertension: Secondary | ICD-10-CM

## 2012-12-15 DIAGNOSIS — I447 Left bundle-branch block, unspecified: Secondary | ICD-10-CM | POA: Insufficient documentation

## 2012-12-15 DIAGNOSIS — K7689 Other specified diseases of liver: Secondary | ICD-10-CM | POA: Insufficient documentation

## 2012-12-15 DIAGNOSIS — I4891 Unspecified atrial fibrillation: Secondary | ICD-10-CM | POA: Insufficient documentation

## 2012-12-15 NOTE — Progress Notes (Signed)
Echocardiogram performed.  

## 2012-12-17 ENCOUNTER — Encounter: Payer: Self-pay | Admitting: Physician Assistant

## 2012-12-29 ENCOUNTER — Encounter: Payer: 59 | Admitting: Nurse Practitioner

## 2013-01-02 ENCOUNTER — Ambulatory Visit (INDEPENDENT_AMBULATORY_CARE_PROVIDER_SITE_OTHER): Payer: 59 | Admitting: Internal Medicine

## 2013-01-02 ENCOUNTER — Encounter: Payer: Self-pay | Admitting: Internal Medicine

## 2013-01-02 VITALS — BP 141/90 | HR 86 | Ht 76.0 in | Wt 291.8 lb

## 2013-01-02 DIAGNOSIS — I428 Other cardiomyopathies: Secondary | ICD-10-CM

## 2013-01-02 DIAGNOSIS — I4891 Unspecified atrial fibrillation: Secondary | ICD-10-CM

## 2013-01-02 DIAGNOSIS — G478 Other sleep disorders: Secondary | ICD-10-CM

## 2013-01-02 DIAGNOSIS — I10 Essential (primary) hypertension: Secondary | ICD-10-CM

## 2013-01-02 DIAGNOSIS — G4730 Sleep apnea, unspecified: Secondary | ICD-10-CM

## 2013-01-02 MED ORDER — SPIRONOLACTONE 25 MG PO TABS: 25.0000 mg | ORAL_TABLET | Freq: Every day | ORAL | Status: DC

## 2013-01-02 MED ORDER — CARVEDILOL 25 MG PO TABS: 25.0000 mg | ORAL_TABLET | Freq: Two times a day (BID) | ORAL | Status: DC

## 2013-01-02 NOTE — Patient Instructions (Signed)
Your physician recommends that you return for lab work in: 3 days for BMET/Dig level  then Your physician recommends that you return for lab work in: 14 days for BMET  Your physician has recommended you make the following change in your medication:  1) Increase Carvedilol to 25 mg twice daily 2) Start Aldactone 25 mg daily  Your physician has recommended that you have a sleep study. This test records several body functions during sleep, including: brain activity, eye movement, oxygen and carbon dioxide blood levels, heart rate and rhythm, breathing rate and rhythm, the flow of air through your mouth and nose, snoring, body muscle movements, and chest and belly movement.  Your physician recommends that you schedule a follow-up appointment in: 4-5 weeks with Dr. Graciela Husbands.

## 2013-01-02 NOTE — Progress Notes (Signed)
ELECTROPHYSIOLOGY CONSULT NOTE  Patient ID: Aaron Thompson, MRN: [id number], DOB/AGE: Jan 07, 1961 52 y.o. Admit date: (Not on file) Date of Consult: 01/02/2013  Primary Physician: Roxy Manns, MD Primary Cardiologist:PN  Chief Complaint:  ICD   HPI Aaron Thompson is a 52 y.o. male  Referred for consideration of an ICD.   He has a history of paroxysmal atrial fibrillation with prior cardioversion, nonobstructive coronary disease by catheterization July 2013, hypertension and diabetes and a nonischemic cardiomyopathy initially identified in 2013.  Most recent ejection fraction of 25-30% November 2014 with moderate LAE  He has been on Rivaroxaban for atrial fibrillation.  ECG 11/14 demonstrated sinus rhythm with left bundle branch block and a QRS duration of 160 ms  He has noted progressive dyspnea on exertion. This is significantly different from what was in in 6 months ago. He did have CPX 15-18 months ago and at that time he had no significant cardiac limitation.  He has a history of tachypalpitations; his last 3-5 minutes. These have been going on for about 30 years. They're frog negative and diuretic positive  He has significant snoring and daytime somnolence. He does not have nocturnal dyspnea or peripheral edema.  He's had no syncope Past Medical History  Diagnosis Date  . Hypertension   . DJD (degenerative joint disease)   . Dermatophytosis of the body   . Calculus of kidney   . Esophageal reflux   . Other chronic nonalcoholic liver disease     fatty liver  . Unspecified hearing loss   . Anxiety state, unspecified   . Non-ischemic cardiomyopathy     a.  cath 6/11: OM2 30%; EF 30%;  b. echo 2/12: EF 30%, mild MR, mild LAE;  c.  Echo (11/14):  Mod LVH, EF 25-30%, diff HK, mod LAE.   [name] Atrial fibrillation     a. s/p DCCV 6/11; previously on Pradaxa;  b. Event Monitor 2012->No PAF;  c. 09/2011 s/p DCCV ->Xarelto started.  [name] LBBB (left bundle branch block)    intermittent  . Hx of colonic polyps   . Osteoarthrosis, unspecified whether generalized or localized, hand   . Diarrhea   . Gastroenteritis   . Abdominal pain, right upper quadrant   . Folliculitis   . Diabetes mellitus       Surgical History:  Past Surgical History  Procedure Laterality Date  . Neck surgery      plate/fusion  . Bilateral knee arthroscopy    . Cardioversion  06/2009    Non ischemic cardiomyopathy - cath and echo  . Stress cardiolite  12/02/1998    Normal EF 52%  . Stress cardiolite  08/2005    Normal, EF 42%  . Cardiovascular stress test  2006    Neg per pt  . Upper gastrointestinal endoscopy  08/2006    GERD  . Abdominal US  01/2007    Fatty liver, no gallstones  . Doppler echocardiography  11/2009    Decreased EF of 35-40%  . Appendectomy    . Cardioversion  09/29/2011    Procedure: CARDIOVERSION;  Surgeon: Vesta Mixer, MD;  Location: [facility];  Service: Cardiovascular;  Laterality: [name];     Home Meds: Prior to Admission medications   Medication Sig Start Date End Date Taking? Authorizing Provider  carvedilol (COREG) 12.5 MG tablet Take 1.5 tablets (18.75 mg total) by mouth 2 (two) times daily with a meal. 11/29/12  Yes Beatrice Lecher, PA-C  digoxin (LANOXIN) 0.25 MG tablet  Take 1 tablet (250 mcg total) by mouth daily. 11/29/12  Yes Scott T Alben Spittle, PA-C  losartan-hydrochlorothiazide (HYZAAR) 50-12.5 MG per tablet Take 1 tablet by mouth daily. 11/29/12  Yes Beatrice Lecher, PA-C  Multiple Vitamin (MULTIVITAMIN) tablet Take 1 tablet by mouth daily.     Yes Historical Provider, MD  Omeprazole-Sodium Bicarbonate (ZEGERID OTC) 20-1100 MG CAPS Take 1 capsule by mouth daily.    Yes Historical Provider, MD  Rivaroxaban (XARELTO) 20 MG TABS tablet Take 1 tablet (20 mg total) by mouth daily with supper. 11/29/12  Yes Beatrice Lecher, PA-C     Allergies: No Known Allergies  History   Social History  . Marital Status: Married    Spouse Name: [name]    Number of  Children: 2  . Years of Education: [name]   Occupational History  . PAINTER Unemployed   Social History Main Topics  . Smoking status: Never Smoker   . Smokeless tobacco: Not on file  . Alcohol Use: No  . Drug Use: No  . Sexual Activity: Yes   Other Topics Concern  . Not on file   Social History Narrative   Lives in [place] with wife.  Works @ [employer] in [employer].  Not routinely exercising.     Family History  Problem Relation Age of Onset  . Hypertension Mother   . Diabetes Mother   . Hypertension Father   . Diabetes Father   . Diabetes Brother   . Kidney disease Brother   . Diabetes Brother   . Heart failure Mother     Died @ 72  . Heart failure Father     Died @ 49  . Other Sister     4 sisters A&W     ROS:  Please see the history of present illness.     All other systems reviewed and negative.    Physical Exam:   Blood pressure 141/90, pulse 86, height 6\' 4"  (1.93 m), weight 291 lb 12.8 oz (132.36 kg). General: Well developed, well nourished plethoric  male in no acute distress. Head: Normocephalic, atraumatic, sclera non-icteric, no xanthomas, nares are without discharge. EENT: normal Lymph Nodes:  none Back: without scoliosis/kyphosis no CVA tendersness Neck: Negative for carotid bruits. JVD not elevated. Lungs: Clear bilaterally to auscultation without wheezes, rales, or rhonchi. Breathing is unlabored. Heart: RRR with S1 S2. No  murmur , rubs, or gallops appreciated. Abdomen: Soft, non-tender, non-distended with normoactive bowel sounds. No hepatomegaly. No rebound/guarding. No obvious abdominal masses. Msk:  Strength and tone appear normal for age. Extremities: No clubbing or cyanosis. No* edema.  Distal pedal pulses are 2+ and equal bilaterally. Skin: Warm and Dry Neuro: Alert and oriented X 3. CN III-XII intact Grossly normal sensory and motor function . Psych:  Responds to questions appropriately with a normal affect.       Labs: Cardiac Enzymes No results found for this basename: CKTOTAL, CKMB, TROPONINI,  in the last 72 hours CBC Lab Results  Component Value Date   WBC 7.9 11/29/2012   HGB 16.2 11/29/2012   HCT 47.0 11/29/2012   MCV 84.4 11/29/2012   PLT 182.0 11/29/2012   PROTIME: No results found for this basename: LABPROT, INR,  in the last 72 hours Chemistry No results found for this basename: NA, K, CL, CO2, BUN, CREATININE, CALCIUM, LABALBU, PROT, BILITOT, ALKPHOS, ALT, AST, GLUCOSE,  in the last 168 hours Lipids Lab Results  Component Value Date   CHOL 151 08/18/2011  HDL 33* 08/18/2011   LDLCALC 79 08/18/2011   TRIG 197* 08/18/2011   BNP Pro B Natriuretic peptide (BNP)  Date/Time Value Range Status  08/17/2011 10:28 AM 36.3  0 - 125 pg/mL Final  08/01/2009 10:47 AM 78.8  0.0-100.0 pg/mL Final   Miscellaneous Lab Results  Component Value Date   DDIMER 0.33 08/18/2011    Radiology/Studies:  No results found.  EKG: As above    Assessment and Plan:  Nonischemic cardiomyopathy  Left bundle branch block  Congestive heart failure-chronic-systolic  Hypertension  Sleep disorder breathing   The patient has progressive symptoms of congestive heart failure in the setting of nonischemic cardiac myopathy and worsening left ventricular function and left bundle branch block with a QRS duration of 165 ms. He now is class I criteria for CRT-D implantation. We have reviewed the potential benefits and risks and he would like to consider further; he has 2 concerns. The first is that he works at [employer] and is concerned about the interaction with motors. To that end we will try and arrange with [employer] to explore his work place.  The second issue is timing; he would have to defer any procedure until after the [facility] tournament in March. He  His blood pressures excessive. We'll increase his carvedilol to making 18.75--25 twice daily. We'll also begin him on Aldactone 25 mg a day. We reviewed side  effects. We'll check his potassium 3 days and then 14 days. Plan to see him in about 4-6 weeks.  Will also check a digoxin level; has not been checked since 9 2013   he also need a sleep study. We will arrange.          Sherryl Manges

## 2013-01-05 ENCOUNTER — Other Ambulatory Visit (INDEPENDENT_AMBULATORY_CARE_PROVIDER_SITE_OTHER): Payer: 59

## 2013-01-05 DIAGNOSIS — I4891 Unspecified atrial fibrillation: Secondary | ICD-10-CM

## 2013-01-05 DIAGNOSIS — I10 Essential (primary) hypertension: Secondary | ICD-10-CM

## 2013-01-05 DIAGNOSIS — I428 Other cardiomyopathies: Secondary | ICD-10-CM

## 2013-01-05 LAB — BASIC METABOLIC PANEL
BUN: 21 mg/dL (ref 6–23)
CO2: 25 mEq/L (ref 19–32)
Chloride: 99 mEq/L (ref 96–112)
Creatinine, Ser: 1 mg/dL (ref 0.4–1.5)
Glucose, Bld: 289 mg/dL — ABNORMAL HIGH (ref 70–99)

## 2013-01-16 ENCOUNTER — Other Ambulatory Visit (INDEPENDENT_AMBULATORY_CARE_PROVIDER_SITE_OTHER): Payer: 59

## 2013-01-16 DIAGNOSIS — I4891 Unspecified atrial fibrillation: Secondary | ICD-10-CM

## 2013-01-16 DIAGNOSIS — I428 Other cardiomyopathies: Secondary | ICD-10-CM

## 2013-01-16 DIAGNOSIS — I10 Essential (primary) hypertension: Secondary | ICD-10-CM

## 2013-01-16 LAB — BASIC METABOLIC PANEL
BUN: 16 mg/dL (ref 6–23)
CO2: 28 mEq/L (ref 19–32)
Calcium: 8.6 mg/dL (ref 8.4–10.5)
Creatinine, Ser: 0.7 mg/dL (ref 0.4–1.5)
GFR: 130.54 mL/min (ref >60.00)
Glucose, Bld: 235 mg/dL — ABNORMAL HIGH (ref 70–99)
Sodium: 137 mEq/L (ref 135–145)

## 2013-01-26 ENCOUNTER — Encounter: Payer: Self-pay | Admitting: *Deleted

## 2013-01-31 ENCOUNTER — Telehealth: Payer: Self-pay | Admitting: Family Medicine

## 2013-01-31 DIAGNOSIS — E119 Type 2 diabetes mellitus without complications: Secondary | ICD-10-CM

## 2013-01-31 DIAGNOSIS — I10 Essential (primary) hypertension: Secondary | ICD-10-CM

## 2013-01-31 NOTE — Telephone Encounter (Signed)
Message copied by Abner Greenspan on Wed Jan 31, 2013 10:00 PM ------      Message from: Ellamae Sia      Created: Wed Jan 31, 2013  5:42 PM      Regarding: Lab orders for Thursday, 1.8.15       Labs for a f/u ------

## 2013-02-01 ENCOUNTER — Other Ambulatory Visit (INDEPENDENT_AMBULATORY_CARE_PROVIDER_SITE_OTHER): Payer: 59

## 2013-02-01 DIAGNOSIS — I10 Essential (primary) hypertension: Secondary | ICD-10-CM

## 2013-02-01 DIAGNOSIS — E119 Type 2 diabetes mellitus without complications: Secondary | ICD-10-CM

## 2013-02-01 LAB — COMPREHENSIVE METABOLIC PANEL
ALBUMIN: 4 g/dL (ref 3.5–5.2)
ALT: 31 U/L (ref 0–53)
AST: 26 U/L (ref 0–37)
Alkaline Phosphatase: 54 U/L (ref 39–117)
BUN: 17 mg/dL (ref 6–23)
CALCIUM: 9.3 mg/dL (ref 8.4–10.5)
CHLORIDE: 100 meq/L (ref 96–112)
CO2: 29 mEq/L (ref 19–32)
Creatinine, Ser: 0.8 mg/dL (ref 0.4–1.5)
GFR: 113.07 mL/min (ref >60.00)
Glucose, Bld: 238 mg/dL — ABNORMAL HIGH (ref 70–99)
POTASSIUM: 3.7 meq/L (ref 3.5–5.1)
SODIUM: 137 meq/L (ref 135–145)
TOTAL PROTEIN: 6.8 g/dL (ref 6.0–8.3)
Total Bilirubin: 0.7 mg/dL (ref 0.3–1.2)

## 2013-02-01 LAB — LIPID PANEL
CHOLESTEROL: 186 mg/dL (ref 0–200)
HDL: 27.5 mg/dL — ABNORMAL LOW (ref >39.00)
TRIGLYCERIDES: 348 mg/dL — AB (ref 0.0–149.0)
Total CHOL/HDL Ratio: 7
VLDL: 69.6 mg/dL — ABNORMAL HIGH (ref 0.0–40.0)

## 2013-02-01 LAB — HEMOGLOBIN A1C: HEMOGLOBIN A1C: 9.9 % — AB (ref 4.6–6.5)

## 2013-02-01 LAB — LDL CHOLESTEROL, DIRECT: Direct LDL: 109.1 mg/dL

## 2013-02-04 ENCOUNTER — Ambulatory Visit (HOSPITAL_BASED_OUTPATIENT_CLINIC_OR_DEPARTMENT_OTHER): Payer: 59 | Attending: Internal Medicine | Admitting: Radiology

## 2013-02-04 VITALS — Ht 76.0 in | Wt 285.0 lb

## 2013-02-04 DIAGNOSIS — G4733 Obstructive sleep apnea (adult) (pediatric): Secondary | ICD-10-CM | POA: Insufficient documentation

## 2013-02-04 DIAGNOSIS — Z6834 Body mass index (BMI) 34.0-34.9, adult: Secondary | ICD-10-CM | POA: Insufficient documentation

## 2013-02-04 DIAGNOSIS — R0989 Other specified symptoms and signs involving the circulatory and respiratory systems: Secondary | ICD-10-CM | POA: Insufficient documentation

## 2013-02-04 DIAGNOSIS — R0609 Other forms of dyspnea: Secondary | ICD-10-CM | POA: Insufficient documentation

## 2013-02-04 DIAGNOSIS — G4730 Sleep apnea, unspecified: Secondary | ICD-10-CM

## 2013-02-05 ENCOUNTER — Encounter: Payer: Self-pay | Admitting: Family Medicine

## 2013-02-05 ENCOUNTER — Encounter (HOSPITAL_COMMUNITY): Payer: Self-pay | Admitting: Pharmacy Technician

## 2013-02-05 ENCOUNTER — Telehealth: Payer: Self-pay

## 2013-02-05 ENCOUNTER — Encounter: Payer: Self-pay | Admitting: Internal Medicine

## 2013-02-05 ENCOUNTER — Ambulatory Visit (INDEPENDENT_AMBULATORY_CARE_PROVIDER_SITE_OTHER): Payer: 59 | Admitting: Family Medicine

## 2013-02-05 ENCOUNTER — Ambulatory Visit (INDEPENDENT_AMBULATORY_CARE_PROVIDER_SITE_OTHER): Payer: 59 | Admitting: Internal Medicine

## 2013-02-05 ENCOUNTER — Encounter: Payer: Self-pay | Admitting: *Deleted

## 2013-02-05 VITALS — BP 149/85 | HR 85 | Ht 76.0 in | Wt 289.6 lb

## 2013-02-05 VITALS — BP 132/78 | HR 92 | Temp 98.4°F | Ht 76.0 in | Wt 289.2 lb

## 2013-02-05 DIAGNOSIS — I4891 Unspecified atrial fibrillation: Secondary | ICD-10-CM

## 2013-02-05 DIAGNOSIS — E785 Hyperlipidemia, unspecified: Secondary | ICD-10-CM | POA: Insufficient documentation

## 2013-02-05 DIAGNOSIS — E119 Type 2 diabetes mellitus without complications: Secondary | ICD-10-CM

## 2013-02-05 DIAGNOSIS — I10 Essential (primary) hypertension: Secondary | ICD-10-CM

## 2013-02-05 DIAGNOSIS — I428 Other cardiomyopathies: Secondary | ICD-10-CM

## 2013-02-05 NOTE — Assessment & Plan Note (Signed)
BP: 132/78 mmHg  bp in fair control at this time  No changes needed Disc lifstyle change with low sodium diet and exercise   Frequent cardiology visits and tx of sleep apnea will elp further

## 2013-02-05 NOTE — Progress Notes (Signed)
    Patient Care Team: Marne A Tower, MD as PCP - General   HPI  Aaron Thompson is a 53 y.o. male Is seen in followup for consideration of ICD implantation. He is a nonischemic cardiomyopathy with left bundle branch block and class III heart failure. He does not have edema. He is mostly functional intolerance and dyspnea on exertion.  He initially wanted to delay his procedure until March. He now comes in deciding to go sooner than that.  He underwent a sleep study yesterday the results are pending  Past Medical History  Diagnosis Date  . Hypertension   . DJD (degenerative joint disease)   . Dermatophytosis of the body   . Calculus of kidney   . Esophageal reflux   . Other chronic nonalcoholic liver disease     fatty liver  . Unspecified hearing loss   . Anxiety state, unspecified   . Non-ischemic cardiomyopathy     a.  cath 6/11: OM2 30%; EF 30%;  b. echo 2/12: EF 30%, mild MR, mild LAE;  c.  Echo (11/14):  Mod LVH, EF 25-30%, diff HK, mod LAE.   . Atrial fibrillation     a. s/p DCCV 6/11; previously on Pradaxa;  b. Event Monitor 2012->No PAF;  c. 09/2011 s/p DCCV ->Xarelto started.  . LBBB (left bundle branch block)     intermittent  . Hx of colonic polyps   . Osteoarthrosis, unspecified whether generalized or localized, hand   . Diarrhea   . Gastroenteritis   . Abdominal pain, right upper quadrant   . Folliculitis   . Diabetes mellitus     Past Surgical History  Procedure Laterality Date  . Neck surgery      plate/fusion  . Bilateral knee arthroscopy    . Cardioversion  06/2009    Non ischemic cardiomyopathy - cath and echo  . Stress cardiolite  12/02/1998    Normal EF 52%  . Stress cardiolite  08/2005    Normal, EF 42%  . Cardiovascular stress test  2006    Neg per pt  . Upper gastrointestinal endoscopy  08/2006    GERD  . Abdominal us  01/2007    Fatty liver, no gallstones  . Doppler echocardiography  11/2009    Decreased EF of 35-40%  . Appendectomy     . Cardioversion  09/29/2011    Procedure: CARDIOVERSION;  Surgeon: Philip J Nahser, MD;  Location: [facility] OR;  Service: Cardiovascular;  Laterality: [name];    Current Outpatient Prescriptions  Medication Sig Dispense Refill  . carvedilol (COREG) 25 MG tablet Take 1 tablet (25 mg total) by mouth 2 (two) times daily with a meal.  60 tablet  6  . digoxin (LANOXIN) 0.25 MG tablet Take 1 tablet (250 mcg total) by mouth daily.  30 tablet  12  . losartan-hydrochlorothiazide (HYZAAR) 50-12.5 MG per tablet Take 1 tablet by mouth daily.  30 tablet  11  . Multiple Vitamin (MULTIVITAMIN) tablet Take 1 tablet by mouth daily.        . Omeprazole-Sodium Bicarbonate (ZEGERID OTC) 20-1100 MG CAPS Take 1 capsule by mouth daily.       . Rivaroxaban (XARELTO) 20 MG TABS tablet Take 1 tablet (20 mg total) by mouth daily with supper.  30 tablet  11  . spironolactone (ALDACTONE) 25 MG tablet Take 1 tablet (25 mg total) by mouth daily.  90 tablet  3   No current facility-administered medications for this visit.      No Known Allergies  Review of Systems negative except from HPI and PMH  Physical Exam BP 149/85  Pulse 85  Ht 6\' 4"  (1.93 m)  Wt 289 lb 9.6 oz (131.362 kg)  BMI 35.27 kg/m2 Well developed and well nourished in no acute distress HENT normal E scleral and icterus clear Neck Supple JVP flat; carotids brisk and full Clear to ausculation  Regular rate and rhythm, no murmurs gallops or rub Soft with active bowel sounds No clubbing cyanosis no Edema Alert and oriented, grossly normal motor and sensory function Skin Warm and Dry    Assessment and  Plan  Nonischemic cardiomyopathy  Left bundle branch block  Congestive heart failure-chronic-systolic  Hypertension  Sleep disorder breathing    we did discuss ICD implantation with potential benefits and risks including but not limited to death, perforation, inappropriate therapy, infection and lead dislodgment.   He and is wife agree and are  willing to proceed. We will plan to increase his losartan at the time of the procedure augment blood pressure control.  He is euvolemic and will continue him on current diuretics

## 2013-02-05 NOTE — Patient Instructions (Signed)
Get back on track with diabetic diet / weight loss and exercise as tolerated  Also low fat diet for cholesterol  Start checking sugar in am (fasting) and 2 hours after a meal - keep a log (with better habits )  Follow up in 3 months with labs prior  Find out what brand of diabetic supplies your insurance covers - and then we will be able to px what you need     Diabetes Meal Planning Guide The diabetes meal planning guide is a tool to help you plan your meals and snacks. It is important for people with diabetes to manage their blood glucose (sugar) levels. Choosing the right foods and the right amounts throughout your day will help control your blood glucose. Eating right can even help you improve your blood pressure and reach or maintain a healthy weight. CARBOHYDRATE COUNTING MADE EASY When you eat carbohydrates, they turn to sugar. This raises your blood glucose level. Counting carbohydrates can help you control this level so you feel better. When you plan your meals by counting carbohydrates, you can have more flexibility in what you eat and balance your medicine with your food intake. Carbohydrate counting simply means adding up the total amount of carbohydrate grams in your meals and snacks. Try to eat about the same amount at each meal. Foods with carbohydrates are listed below. Each portion below is 1 carbohydrate serving or 15 grams of carbohydrates. Ask your dietician how many grams of carbohydrates you should eat at each meal or snack. Grains and Starches  1 slice bread.   English muffin or hotdog/hamburger bun.   cup cold cereal (unsweetened).   cup cooked pasta or rice.   cup starchy vegetables (corn, potatoes, peas, beans, winter squash).  1 tortilla (6 inches).   bagel.  1 waffle or pancake (size of a CD).   cup cooked cereal.  4 to 6 small crackers. *Whole grain is recommended. Fruit  1 cup fresh unsweetened berries, melon, papaya, pineapple.  1 small fresh  fruit.   banana or mango.   cup fruit juice (4 oz unsweetened).   cup canned fruit in natural juice or water.  2 tbs dried fruit.  12 to 15 grapes or cherries. Milk and Yogurt  1 cup fat-free or 1% milk.  1 cup soy milk.  6 oz light yogurt with sugar-free sweetener.  6 oz low-fat soy yogurt.  6 oz plain yogurt. Vegetables  1 cup raw or  cup cooked is counted as 0 carbohydrates or a "free" food.  If you eat 3 or more servings at 1 meal, count them as 1 carbohydrate serving. Other Carbohydrates   oz chips or pretzels.   cup ice cream or frozen yogurt.   cup sherbet or sorbet.  2 inch square cake, no frosting.  1 tbs honey, sugar, jam, jelly, or syrup.  2 small cookies.  3 squares of graham crackers.  3 cups popcorn.  6 crackers.  1 cup broth-based soup.  Count 1 cup casserole or other mixed foods as 2 carbohydrate servings.  Foods with less than 20 calories in a serving may be counted as 0 carbohydrates or a "free" food. You may want to purchase a book or computer software that lists the carbohydrate gram counts of different foods. In addition, the nutrition facts panel on the labels of the foods you eat are a good source of this information. The label will tell you how big the serving size is and the total number  of carbohydrate grams you will be eating per serving. Divide this number by 15 to obtain the number of carbohydrate servings in a portion. Remember, 1 carbohydrate serving equals 15 grams of carbohydrate. SERVING SIZES Measuring foods and serving sizes helps you make sure you are getting the right amount of food. The list below tells how big or small some common serving sizes are.  1 oz.........4 stacked dice.  3 oz........Marland KitchenDeck of cards.  1 tsp.......Marland KitchenTip of little finger.  1 tbs......Marland KitchenMarland KitchenThumb.  2 tbs.......Marland KitchenGolf ball.   cup......Marland KitchenHalf of a fist.  1 cup.......Marland KitchenA fist. SAMPLE DIABETES MEAL PLAN Below is a sample meal plan that  includes foods from the grain and starches, dairy, vegetable, fruit, and meat groups. A dietician can individualize a meal plan to fit your calorie needs and tell you the number of servings needed from each food group. However, controlling the total amount of carbohydrates in your meal or snack is more important than making sure you include all of the food groups at every meal. You may interchange carbohydrate containing foods (dairy, starches, and fruits). The meal plan below is an example of a 2000 calorie diet using carbohydrate counting. This meal plan has 17 carbohydrate servings. Breakfast  1 cup oatmeal (2 carb servings).   cup light yogurt (1 carb serving).  1 cup blueberries (1 carb serving).   cup almonds. Snack  1 large apple (2 carb servings).  1 low-fat string cheese stick. Lunch  Chicken breast salad.  1 cup spinach.   cup chopped tomatoes.  2 oz chicken breast, sliced.  2 tbs low-fat [place] dressing.  12 whole-wheat crackers (2 carb servings).  12 to 15 grapes (1 carb serving).  1 cup low-fat milk (1 carb serving). Snack  1 cup carrots.   cup hummus (1 carb serving). Dinner  3 oz broiled salmon.  1 cup brown rice (3 carb servings). Snack  1  cups steamed broccoli (1 carb serving) drizzled with 1 tsp olive oil and lemon juice.  1 cup light pudding (2 carb servings). DIABETES MEAL PLANNING WORKSHEET Your dietician can use this worksheet to help you decide how many servings of foods and what types of foods are right for you.  BREAKFAST Food Group and Servings / Carb Servings Grain/Starches __________________________________ Dairy __________________________________________ Vegetable ______________________________________ Fruit ___________________________________________ Meat __________________________________________ Fat ____________________________________________ LUNCH Food Group and Servings / Carb Servings Grain/Starches  ___________________________________ Dairy ___________________________________________ Fruit ____________________________________________ Meat ___________________________________________ Fat _____________________________________________ Aaron Thompson Food Group and Servings / Carb Servings Grain/Starches ___________________________________ Dairy ___________________________________________ Fruit ____________________________________________ Meat ___________________________________________ Fat _____________________________________________ SNACKS Food Group and Servings / Carb Servings Grain/Starches ___________________________________ Dairy ___________________________________________ Vegetable _______________________________________ Fruit ____________________________________________ Meat ___________________________________________ Fat _____________________________________________ DAILY TOTALS Starches _________________________ Vegetable ________________________ Fruit ____________________________ Dairy ____________________________ Meat ____________________________ Fat ______________________________ Document Released: 10/08/2004 Document Revised: 04/05/2011 Document Reviewed: 08/19/2008 Exit[employer] Patient Information 2014 [employer]

## 2013-02-05 NOTE — Patient Instructions (Signed)
Your physician recommends that you continue on your current medications as directed. Please refer to the Current Medication list given to you today.  Your physician recommends that you return for lab work on 02/07/13 for BMET/CBCD  Your physician has recommended that you have a defibrillator inserted. An implantable cardioverter defibrillator (ICD) is a small device that is placed in your chest or, in rare cases, your abdomen. This device uses electrical pulses or shocks to help control life-threatening, irregular heartbeats that could lead the heart to suddenly stop beating (sudden cardiac arrest). Leads are attached to the ICD that goes into your heart. This is done in the hospital and usually requires an overnight stay. Please see the instruction sheet given to you today for more information.  You are scheduled for 02/14/13, be at hospital at 1:30 pm

## 2013-02-05 NOTE — Progress Notes (Signed)
Pre-visit discussion using our clinic review tool. No additional management support is needed unless otherwise documented below in the visit note.  

## 2013-02-05 NOTE — Assessment & Plan Note (Signed)
Disc goals for lipids and reasons to control them Rev labs with pt Rev low sat fat diet in detail High trig - may be from glucose Low HDL - limited exercise Re check 3 mo

## 2013-02-05 NOTE — Progress Notes (Signed)
Subjective:    Patient ID: Aaron Thompson, male    DOB: [birth year], 53 y.o.   MRN: [id number]  HPI Here for f/u of chronic problems  Did a sleep apnea test last night - for Dr Caryl Comes - he cannot tolerate a mask   On meds for a fib Will be putting a defib/ pacemaker soon  Wife just dx with breast cancer   Wt is up about 4 lb   bp is stable today  No cp or palpitations or headaches or edema  No side effects to medicines  BP Readings from Last 3 Encounters:  02/05/13 132/78  01/02/13 141/90  11/29/12 130/80     Diabetes Home sugar results (here sugar was in the 230s) DM diet - he entirely fell off the wagon - ready to get back into good habits  He has a good understanding of what to eat    (in the past he lost 45 lb with diet change)  He did DM teaching classes in the past  Exercise - more difficult due to heart fxn issues  Symptoms-- thirst and frequent urination  A1C last  Lab Results  Component Value Date   HGBA1C 9.9* 02/01/2013  in the past was 6-7 with diet and exercise   No medicines-- diet controlled in the past  Renal protection-on ARB Last eye exam 10/14 was ok   Hyperlipidemia hx  Lab Results  Component Value Date   CHOL 186 02/01/2013   CHOL 151 08/18/2011   CHOL 176 09/11/2007   Lab Results  Component Value Date   HDL 27.50* 02/01/2013   HDL 33* 08/18/2011   HDL 31.0* 09/11/2007   Lab Results  Component Value Date   LDLCALC 79 08/18/2011   LDLCALC 115* 09/11/2007   Lab Results  Component Value Date   TRIG 348.0* 02/01/2013   TRIG 197* 08/18/2011   TRIG 148 09/11/2007   Lab Results  Component Value Date   CHOLHDL 7 02/01/2013   CHOLHDL 4.6 08/18/2011   CHOLHDL 5.7 CALC 09/11/2007   Lab Results  Component Value Date   LDLDIRECT 109.1 02/01/2013   overall inc in trig - likely from high sugar  Low HDL   Patient Active Problem List   Diagnosis Date Noted  . Acute bacterial sinusitis 03/28/2012  . DM II (diabetes mellitus, type II), controlled 09/30/2011    . Nonischemic cardiomyopathy 08/18/2011  . NSVT (nonsustained ventricular tachycardia) 08/18/2011  . Chest pain on exertion 08/17/2011  . Unstable angina 08/17/2011  . Type II or unspecified type diabetes mellitus without mention of complication, not stated as uncontrolled 10/06/2010  . Palpitations 05/12/2010  . CARDIOMYOPATHY OTHER DISEASES CLASSIFIED ELSW 07/16/2009  . COLONIC POLYPS 12/09/2008  . OSTEOARTHROSIS UNSPEC WHETHER GEN/LOCALIZED HAND 08/16/2008  . HYPERTENSION 05/11/2007  . History of renal calculi 05/11/2007  . FATTY LIVER DISEASE 03/20/2007  . ANXIETY 10/04/2006  . HEARING IMPAIRMENT 10/04/2006  . Atrial fibrillation 10/04/2006  . FOLLICULITIS 38/18/2993  . GERD 09/20/2006   Past Medical History  Diagnosis Date  . Hypertension   . DJD (degenerative joint disease)   . Dermatophytosis of the body   . Calculus of kidney   . Esophageal reflux   . Other chronic nonalcoholic liver disease     fatty liver  . Unspecified hearing loss   . Anxiety state, unspecified   . Non-ischemic cardiomyopathy     a.  cath 6/11: OM2 30%; EF 30%;  b. echo 2/12: EF 30%, mild MR, mild  LAE;  c.  Echo (11/14):  Mod LVH, EF 25-30%, diff HK, mod LAE.   [name] Atrial fibrillation     a. s/p DCCV 6/11; previously on Pradaxa;  b. Event Monitor 2012->No PAF;  c. 09/2011 s/p DCCV ->Xarelto started.  [name] LBBB (left bundle branch block)     intermittent  . Hx of colonic polyps   . Osteoarthrosis, unspecified whether generalized or localized, hand   . Diarrhea   . Gastroenteritis   . Abdominal pain, right upper quadrant   . Folliculitis   . Diabetes mellitus    Past Surgical History  Procedure Laterality Date  . Neck surgery      plate/fusion  . Bilateral knee arthroscopy    . Cardioversion  06/2009    Non ischemic cardiomyopathy - cath and echo  . Stress cardiolite  12/02/1998    Normal EF 52%  . Stress cardiolite  08/2005    Normal, EF 42%  . Cardiovascular stress test  2006    Neg per  pt  . Upper gastrointestinal endoscopy  08/2006    GERD  . Abdominal US  01/2007    Fatty liver, no gallstones  . Doppler echocardiography  11/2009    Decreased EF of 35-40%  . Appendectomy    . Cardioversion  09/29/2011    Procedure: CARDIOVERSION;  Surgeon: Thayer Headings, MD;  Location: [facility] OR;  Service: Cardiovascular;  Laterality: [name];   History  Substance Use Topics  . Smoking status: Never Smoker   . Smokeless tobacco: Not on file  . Alcohol Use: No   Family History  Problem Relation Age of Onset  . Hypertension Mother   . Diabetes Mother   . Hypertension Father   . Diabetes Father   . Diabetes Brother   . Kidney disease Brother   . Diabetes Brother   . Heart failure Mother     Died @ 89  . Heart failure Father     Died @ 47  . Other Sister     4 sisters A&W   No Known Allergies Current Outpatient Prescriptions on File Prior to Visit  Medication Sig Dispense Refill  . carvedilol (COREG) 25 MG tablet Take 1 tablet (25 mg total) by mouth 2 (two) times daily with a meal.  60 tablet  6  . digoxin (LANOXIN) 0.25 MG tablet Take 1 tablet (250 mcg total) by mouth daily.  30 tablet  12  . losartan-hydrochlorothiazide (HYZAAR) 50-12.5 MG per tablet Take 1 tablet by mouth daily.  30 tablet  11  . Multiple Vitamin (MULTIVITAMIN) tablet Take 1 tablet by mouth daily.        [name] Bicarbonate (ZEGERID OTC) 20-1100 MG CAPS Take 1 capsule by mouth daily.       . Rivaroxaban (XARELTO) 20 MG TABS tablet Take 1 tablet (20 mg total) by mouth daily with supper.  30 tablet  11  . spironolactone (ALDACTONE) 25 MG tablet Take 1 tablet (25 mg total) by mouth daily.  90 tablet  3   No current facility-administered medications on file prior to visit.    Review of Systems Review of Systems  Constitutional: Negative for fever, appetite change, fatigue and unexpected weight change.  Eyes: Negative for pain and visual disturbance.  Respiratory: Negative for cough and shortness of  breath.   Cardiovascular: Negative for cp or palpitations    Gastrointestinal: Negative for nausea, diarrhea and constipation.  Genitourinary: Negative for urgency and frequency. pos for excessive thirst  Skin: Negative for pallor or rash   Neurological: Negative for weakness, light-headedness, numbness and headaches.  Hematological: Negative for adenopathy. Does not bruise/bleed easily.  Psychiatric/Behavioral: Negative for dysphoric mood. The patient is not nervous/anxious.         Objective:   Physical Exam  Constitutional: He appears well-developed and well-nourished. No distress.  obese and well appearing   HENT:  Head: Normocephalic and atraumatic.  Mouth/Throat: Oropharynx is clear and moist.  Eyes: Conjunctivae and EOM are normal. Pupils are equal, round, and reactive to light. Right eye exhibits no discharge. Left eye exhibits no discharge. No scleral icterus.  Neck: Normal range of motion. No JVD present. Carotid bruit is not present. No thyromegaly present.  Cardiovascular: Normal rate and regular rhythm.  Exam reveals no gallop.   Pulmonary/Chest: Effort normal and breath sounds normal. He has no wheezes. He has no rales.  No crackles   Abdominal: He exhibits no abdominal bruit.  Musculoskeletal: He exhibits no edema.  Lymphadenopathy:    He has no cervical adenopathy.  Neurological: He is alert. He has normal reflexes.  Skin: Skin is warm and dry. No rash noted. No pallor.  Psychiatric: He has a normal mood and affect.          Assessment & Plan:

## 2013-02-05 NOTE — Telephone Encounter (Signed)
Px is on IN box to fax

## 2013-02-05 NOTE — Telephone Encounter (Signed)
Called pt and no answer and no voicemail set up 

## 2013-02-05 NOTE — Assessment & Plan Note (Signed)
Poor control with bad diet/ wt gain  Lab Results  Component Value Date   HGBA1C 9.9* 02/01/2013    He has lost wt and controlled this with lifestyle change before and has gone to DM teaching  Will get back on track 3 mo and re check / f/u  opthy utd  Disc foot care and meal planning

## 2013-02-05 NOTE — Telephone Encounter (Signed)
Pt left v/m; pt seen today; pt needs one touch ultra diabetic test strips; ins. Does cover one touch ultra supplies.

## 2013-02-06 ENCOUNTER — Other Ambulatory Visit: Payer: Self-pay | Admitting: Family Medicine

## 2013-02-06 MED ORDER — GLUCOSE BLOOD VI STRP: ORAL_STRIP | Status: DC

## 2013-02-06 NOTE — Telephone Encounter (Signed)
Received an electronic Rx request for this, med refilled electronically with Dr. Marliss Coots directions and paper Rx destroyed

## 2013-02-06 NOTE — Addendum Note (Signed)
Addended by: Tammi Sou on: 02/06/2013 04:24 PM   Modules accepted: Orders

## 2013-02-07 ENCOUNTER — Ambulatory Visit (INDEPENDENT_AMBULATORY_CARE_PROVIDER_SITE_OTHER): Payer: 59 | Admitting: *Deleted

## 2013-02-07 DIAGNOSIS — I4891 Unspecified atrial fibrillation: Secondary | ICD-10-CM

## 2013-02-07 DIAGNOSIS — I428 Other cardiomyopathies: Secondary | ICD-10-CM

## 2013-02-08 LAB — CBC WITH DIFFERENTIAL/PLATELET
BASOS ABS: 0 10*3/uL (ref 0.0–0.1)
Basophils Relative: 0.5 % (ref 0.0–3.0)
EOS PCT: 2.3 % (ref 0.0–5.0)
Eosinophils Absolute: 0.2 10*3/uL (ref 0.0–0.7)
HCT: 46.7 % (ref 39.0–52.0)
HEMOGLOBIN: 16 g/dL (ref 13.0–17.0)
LYMPHS ABS: 2.3 10*3/uL (ref 0.7–4.0)
LYMPHS PCT: 26.8 % (ref 12.0–46.0)
MCHC: 34.4 g/dL (ref 30.0–36.0)
MCV: 84.3 fl (ref 78.0–100.0)
MONOS PCT: 8.9 % (ref 3.0–12.0)
Monocytes Absolute: 0.8 10*3/uL (ref 0.1–1.0)
NEUTROS ABS: 5.3 10*3/uL (ref 1.4–7.7)
Neutrophils Relative %: 61.5 % (ref 43.0–77.0)
Platelets: 166 10*3/uL (ref 150.0–400.0)
RBC: 5.53 Mil/uL (ref 4.22–5.81)
RDW: 13.6 % (ref 11.5–14.6)
WBC: 8.6 10*3/uL (ref 4.5–10.5)

## 2013-02-08 LAB — BASIC METABOLIC PANEL
BUN: 17 mg/dL (ref 6–23)
CO2: 28 mEq/L (ref 19–32)
Calcium: 9 mg/dL (ref 8.4–10.5)
Chloride: 100 mEq/L (ref 96–112)
Creatinine, Ser: 0.7 mg/dL (ref 0.4–1.5)
GFR: 130.5 mL/min (ref >60.00)
GLUCOSE: 179 mg/dL — AB (ref 70–99)
Potassium: 3.7 mEq/L (ref 3.5–5.1)
Sodium: 135 mEq/L (ref 135–145)

## 2013-02-12 DIAGNOSIS — G4733 Obstructive sleep apnea (adult) (pediatric): Secondary | ICD-10-CM

## 2013-02-12 DIAGNOSIS — G478 Other sleep disorders: Secondary | ICD-10-CM

## 2013-02-12 NOTE — Sleep Study (Signed)
   NAME: Aaron Thompson DATE OF BIRTH:  August 23, 1960 MEDICAL RECORD NUMBER [id number]  LOCATION: [facility]  PHYSICIAN: Cutter OF STUDY: 02/04/2013  SLEEP STUDY TYPE: Nocturnal Polysomnogram               REFERRING PHYSICIAN: Deboraha Sprang, MD  INDICATION FOR STUDY: Hypersomnia with sleep apnea  EPWORTH SLEEPINESS SCORE:  2 HEIGHT: 6\' 4"  (193 cm)  WEIGHT: 285 lb (129.275 kg)    Body mass index is 34.71 kg/(m^2).  NECK SIZE: 18.5 in.  SLEEP ARCHITECTURE: The patient had a total sleep time of 302 minutes with no slow-wave sleep and only 36 minutes of REM. Sleep onset latency was normal at 22 minutes and REM onset was very prolonged period sleep efficiency was moderately reduced at 71%.  RESPIRATORY DATA: The patient was found to have poor apneas and 65 obstructive hypopneas, giving him an AHI of 14 events per hour. The events occurred in all body positions, and there was moderate snoring noted throughout. The patient did not meet split-night protocol secondary to the majority of his events occurring well after 1 AM.  OXYGEN DATA: There was oxygen desaturation transiently as low as 86% associated with the patient's obstructive events  CARDIAC DATA: Rare PVC noted, but no clinically significant arrhythmias were seen  MOVEMENT/PARASOMNIA: No significant limb movements or abnormal behaviors were noted.  IMPRESSION/ RECOMMENDATION:    1) mild obstructive sleep apnea/hypopnea syndrome, with an AHI of 14 events per hour and oxygen desaturation as low as 86%. Treatment for this degree of sleep apnea can include a trial of weight loss alone, upper airway surgery, dental appliance, and also CPAP. Clinical correlation is suggested.  2) rare PVC noted, but no clinically significant arrhythmias were seen.     Kathee Delton Diplomate, American Board of Sleep Medicine  ELECTRONICALLY SIGNED ON:  02/12/2013, 9:24 AM [place] PH:  [phone]   FX: [phone] [place]

## 2013-02-13 MED ORDER — DEXTROSE 5 % IV SOLN
3.0000 g | INTRAVENOUS | Status: DC
  Filled 2013-02-13 (×2): qty 3000

## 2013-02-13 MED ORDER — CHLORHEXIDINE GLUCONATE 4 % EX LIQD
60.0000 mL | Freq: Once | CUTANEOUS | Status: DC
  Filled 2013-02-13: qty 60

## 2013-02-13 MED ORDER — SODIUM CHLORIDE 0.9 % IR SOLN
80.0000 mg | Status: DC
  Filled 2013-02-13: qty 2

## 2013-02-14 ENCOUNTER — Encounter (HOSPITAL_COMMUNITY)
Admission: RE | Disposition: A | Discharge status: Home / Self Care | Payer: Self-pay | Source: Ambulatory Visit | Attending: Internal Medicine

## 2013-02-14 ENCOUNTER — Ambulatory Visit (HOSPITAL_COMMUNITY)
Admission: RE | Admit: 2013-02-14 | Discharge: 2013-02-15 | Disposition: A | Discharge status: Home / Self Care | Payer: 59 | Source: Ambulatory Visit | Attending: Internal Medicine | Admitting: Internal Medicine

## 2013-02-14 ENCOUNTER — Encounter (HOSPITAL_COMMUNITY): Payer: Self-pay | Admitting: General Practice

## 2013-02-14 DIAGNOSIS — E119 Type 2 diabetes mellitus without complications: Secondary | ICD-10-CM | POA: Insufficient documentation

## 2013-02-14 DIAGNOSIS — Z87442 Personal history of urinary calculi: Secondary | ICD-10-CM

## 2013-02-14 DIAGNOSIS — F411 Generalized anxiety disorder: Secondary | ICD-10-CM

## 2013-02-14 DIAGNOSIS — I4729 Other ventricular tachycardia: Secondary | ICD-10-CM

## 2013-02-14 DIAGNOSIS — I509 Heart failure, unspecified: Secondary | ICD-10-CM

## 2013-02-14 DIAGNOSIS — R079 Chest pain, unspecified: Secondary | ICD-10-CM

## 2013-02-14 DIAGNOSIS — L738 Other specified follicular disorders: Secondary | ICD-10-CM

## 2013-02-14 DIAGNOSIS — D126 Benign neoplasm of colon, unspecified: Secondary | ICD-10-CM

## 2013-02-14 DIAGNOSIS — H9190 Unspecified hearing loss, unspecified ear: Secondary | ICD-10-CM

## 2013-02-14 DIAGNOSIS — I428 Other cardiomyopathies: Secondary | ICD-10-CM | POA: Insufficient documentation

## 2013-02-14 DIAGNOSIS — I5022 Chronic systolic (congestive) heart failure: Secondary | ICD-10-CM

## 2013-02-14 DIAGNOSIS — K219 Gastro-esophageal reflux disease without esophagitis: Secondary | ICD-10-CM | POA: Insufficient documentation

## 2013-02-14 DIAGNOSIS — I472 Ventricular tachycardia: Secondary | ICD-10-CM

## 2013-02-14 DIAGNOSIS — I4891 Unspecified atrial fibrillation: Secondary | ICD-10-CM | POA: Insufficient documentation

## 2013-02-14 DIAGNOSIS — M19049 Primary osteoarthritis, unspecified hand: Secondary | ICD-10-CM

## 2013-02-14 DIAGNOSIS — K7689 Other specified diseases of liver: Secondary | ICD-10-CM

## 2013-02-14 DIAGNOSIS — I43 Cardiomyopathy in diseases classified elsewhere: Secondary | ICD-10-CM

## 2013-02-14 DIAGNOSIS — M1990 Unspecified osteoarthritis, unspecified site: Secondary | ICD-10-CM | POA: Insufficient documentation

## 2013-02-14 DIAGNOSIS — I10 Essential (primary) hypertension: Secondary | ICD-10-CM

## 2013-02-14 DIAGNOSIS — R002 Palpitations: Secondary | ICD-10-CM

## 2013-02-14 DIAGNOSIS — E785 Hyperlipidemia, unspecified: Secondary | ICD-10-CM

## 2013-02-14 DIAGNOSIS — I447 Left bundle-branch block, unspecified: Secondary | ICD-10-CM | POA: Insufficient documentation

## 2013-02-14 DIAGNOSIS — I200 Unstable angina: Secondary | ICD-10-CM

## 2013-02-14 HISTORY — DX: Type 2 diabetes mellitus without complications: E11.9

## 2013-02-14 HISTORY — DX: Obstructive sleep apnea (adult) (pediatric): G47.33

## 2013-02-14 HISTORY — PX: BI-VENTRICULAR IMPLANTABLE CARDIOVERTER DEFIBRILLATOR: SHX5459

## 2013-02-14 HISTORY — DX: Dependence on other enabling machines and devices: Z99.89

## 2013-02-14 LAB — SURGICAL PCR SCREEN
MRSA, PCR: NEGATIVE
Staphylococcus aureus: NEGATIVE

## 2013-02-14 LAB — GLUCOSE, CAPILLARY: Glucose-Capillary: 232 mg/dL — ABNORMAL HIGH (ref 70–99)

## 2013-02-14 SURGERY — BI-VENTRICULAR IMPLANTABLE CARDIOVERTER DEFIBRILLATOR  (CRT-D)
Anesthesia: LOCAL

## 2013-02-14 MED ORDER — MIDAZOLAM HCL 5 MG/5ML IJ SOLN
INTRAMUSCULAR | Status: AC
  Filled 2013-02-14: qty 5

## 2013-02-14 MED ORDER — LIDOCAINE HCL (PF) 1 % IJ SOLN
INTRAMUSCULAR | Status: AC
  Filled 2013-02-14: qty 30

## 2013-02-14 MED ORDER — SODIUM CHLORIDE 0.9 % IV SOLN
INTRAVENOUS | Status: DC
  Administered 2013-02-14: 15:00:00 via INTRAVENOUS

## 2013-02-14 MED ORDER — RIVAROXABAN 20 MG PO TABS
20.0000 mg | ORAL_TABLET | Freq: Every day | ORAL | Status: DC
  Administered 2013-02-14: 20 mg via ORAL
  Filled 2013-02-14 (×2): qty 1

## 2013-02-14 MED ORDER — MUPIROCIN 2 % EX OINT
TOPICAL_OINTMENT | Freq: Once | CUTANEOUS | Status: AC
  Administered 2013-02-14: 1 via NASAL
  Filled 2013-02-14: qty 22

## 2013-02-14 MED ORDER — SPIRONOLACTONE 25 MG PO TABS
25.0000 mg | ORAL_TABLET | Freq: Every day | ORAL | Status: DC
  Administered 2013-02-15: 25 mg via ORAL
  Filled 2013-02-14: qty 1

## 2013-02-14 MED ORDER — CARVEDILOL 25 MG PO TABS
25.0000 mg | ORAL_TABLET | Freq: Two times a day (BID) | ORAL | Status: DC
  Administered 2013-02-15: 25 mg via ORAL
  Filled 2013-02-14 (×3): qty 1

## 2013-02-14 MED ORDER — FENTANYL CITRATE 0.05 MG/ML IJ SOLN
INTRAMUSCULAR | Status: AC
  Filled 2013-02-14: qty 2

## 2013-02-14 MED ORDER — LOSARTAN POTASSIUM 50 MG PO TABS
50.0000 mg | ORAL_TABLET | Freq: Every day | ORAL | Status: DC
  Administered 2013-02-15: 50 mg via ORAL
  Filled 2013-02-14: qty 1

## 2013-02-14 MED ORDER — SODIUM CHLORIDE 0.9 % IV SOLN: INTRAVENOUS | Status: AC

## 2013-02-14 MED ORDER — LIDOCAINE HCL (PF) 1 % IJ SOLN
INTRAMUSCULAR | Status: AC
Start: 2013-02-14 — End: 2013-02-14
  Filled 2013-02-14: qty 30

## 2013-02-14 MED ORDER — DIGOXIN 250 MCG PO TABS
250.0000 ug | ORAL_TABLET | Freq: Every day | ORAL | Status: DC
  Administered 2013-02-14 – 2013-02-15 (×2): 250 ug via ORAL
  Filled 2013-02-14 (×2): qty 1

## 2013-02-14 MED ORDER — ACETAMINOPHEN 325 MG PO TABS
325.0000 mg | ORAL_TABLET | ORAL | Status: DC | PRN
  Administered 2013-02-14: 650 mg via ORAL
  Filled 2013-02-14: qty 2

## 2013-02-14 MED ORDER — MUPIROCIN 2 % EX OINT
TOPICAL_OINTMENT | CUTANEOUS | Status: AC
  Administered 2013-02-14: 1 via NASAL
  Filled 2013-02-14: qty 22

## 2013-02-14 MED ORDER — LOSARTAN POTASSIUM-HCTZ 50-12.5 MG PO TABS: 1.0000 | ORAL_TABLET | Freq: Every day | ORAL | Status: DC

## 2013-02-14 MED ORDER — HYDROCHLOROTHIAZIDE 12.5 MG PO CAPS
12.5000 mg | ORAL_CAPSULE | Freq: Every day | ORAL | Status: DC
  Administered 2013-02-15: 12.5 mg via ORAL
  Filled 2013-02-14: qty 1

## 2013-02-14 MED ORDER — PANTOPRAZOLE SODIUM 40 MG PO TBEC: 40.0000 mg | DELAYED_RELEASE_TABLET | Freq: Every day | ORAL | Status: DC

## 2013-02-14 MED ORDER — CEFAZOLIN SODIUM 1-5 GM-% IV SOLN
1.0000 g | Freq: Four times a day (QID) | INTRAVENOUS | Status: AC
  Administered 2013-02-14 – 2013-02-15 (×3): 1 g via INTRAVENOUS
  Filled 2013-02-14 (×4): qty 50

## 2013-02-14 MED ORDER — ONDANSETRON HCL 4 MG/2ML IJ SOLN: 4.0000 mg | Freq: Four times a day (QID) | INTRAMUSCULAR | Status: DC | PRN

## 2013-02-14 NOTE — CV Procedure (Signed)
MARIUS BETTS [id number]  [id number]  Preop IW:PYKD lbbb chf Postop Dx same/   Procedure:crt D IMPLANTATION HIGH VOLTAGE LEAD ASSESSMENT  Cx: None      Following the obtaining of informed consent the patient was brought to the electrophysiology laboratory in place of the fluoroscopic table in the supine position.  After routine prep and drape, lidocaine was infiltrated in the prepectoral subclavicular region and an incision was made and carried down to the layer of the prepectoral fascia using electrocautery and sharp dissection. A pocket was formed similarly.  Thereafter  attention was turned to gaining access to the extrathoracic left subclavian vein which was accomplished without  difficulty and without the aspiration of air or puncture of the artery.THREE separate venipunctures were accomplished  Sequentially  A 8 French 9.5 French sheath  And 63F sheath were placed through which were   passed a  singlel coil  active fixation defibrillator lead, model 7122 serial number [id number] and a St Jude 135 CS Sheath and St Jude  active fixation atrial lead, serial number [id number] .    The right ventricular lead was manipulated to the apex with a bipolar R wave was 31 mV, impedance 698 ohms, threshold 0.6 V at 0.5 ms, current at threshold 1.0 MA. There was no diaphragmatic stimulation at 10 V. The current of injury was modest.  The coronary sinus was cannulated without difficulty. The wire spontaneously isolated the lateral vein and we used as a buddy wire to deploy a whisper wire and over this was passed a St. Jude 1458Q quadripolar LV lead serial number [id number]. It was deployed to the junction between the and distal thirds. In its location for her at an amplitude of 17 mV with an impedance of 909 arms a threshold of 0.5 that's at 0.5 ms current at threshold was 2.0MA there was no diaphragmatic pacing at 10 V  9-1/2 [place] sheath was removed and the atrial lead was deployed to the right atrial  appendage with a bipolar P-wave was 2.9 with a pain since 638 a threshold of 0.8 V at 0.5 ms current at threshold was 1.0 and a there is no diaphragmatic stimulation at 10 V current of injury was brisk.      The leads were secured to the prepectoral fascia and then attached to a [employer], serial number  [id number].  Through the device, the bipolar R wave was 12 millivolts, impedance was 590 ohms, the pacing threshold was 0.75 volts at 0.5 msec.  The bipolar P wave was 4.02 millivolts, impedance was 640 ohms, the pacing threshold was 0.75 volts at 0.5 msec. AL impedance was 750 in the threshold of 0.75 that's at 0.5 ms  High-voltage impedance was  88 ohms.      The pocket was copiously irrigated with antibiotic containing saline solution. Hemostasis was assured, and the device and the leads were placed in the pocket and secured to the prepectoral fascia.  The wound was closed in 2 layers in normal fashion. The wound was washed dried and a benzoin Steri-Strips dressing was then applied. Needle counts, sponge counts and instrument counts were correct at the end of the procedure according to the staff. DERMABOND DRESSING was applied   k   Virl Axe, MD 02/14/2013 4:51 PM

## 2013-02-14 NOTE — H&P (View-Only) (Signed)
Patient Care Team: Abner Greenspan, MD as PCP - General   HPI  Aaron Thompson is a 53 y.o. male Is seen in followup for consideration of ICD implantation. He is a nonischemic cardiomyopathy with left bundle branch block and class III heart failure. He does not have edema. He is mostly functional intolerance and dyspnea on exertion.  He initially wanted to delay his procedure until March. He now comes in deciding to go sooner than that.  He underwent a sleep study yesterday the results are pending  Past Medical History  Diagnosis Date  . Hypertension   . DJD (degenerative joint disease)   . Dermatophytosis of the body   . Calculus of kidney   . Esophageal reflux   . Other chronic nonalcoholic liver disease     fatty liver  . Unspecified hearing loss   . Anxiety state, unspecified   . Non-ischemic cardiomyopathy     a.  cath 6/11: OM2 30%; EF 30%;  b. echo 2/12: EF 30%, mild MR, mild LAE;  c.  Echo (11/14):  Mod LVH, EF 25-30%, diff HK, mod LAE.   [name] Atrial fibrillation     a. s/p DCCV 6/11; previously on Pradaxa;  b. Event Monitor 2012->No PAF;  c. 09/2011 s/p DCCV ->Xarelto started.  [name] LBBB (left bundle branch block)     intermittent  . Hx of colonic polyps   . Osteoarthrosis, unspecified whether generalized or localized, hand   . Diarrhea   . Gastroenteritis   . Abdominal pain, right upper quadrant   . Folliculitis   . Diabetes mellitus     Past Surgical History  Procedure Laterality Date  . Neck surgery      plate/fusion  . Bilateral knee arthroscopy    . Cardioversion  06/2009    Non ischemic cardiomyopathy - cath and echo  . Stress cardiolite  12/02/1998    Normal EF 52%  . Stress cardiolite  08/2005    Normal, EF 42%  . Cardiovascular stress test  2006    Neg per pt  . Upper gastrointestinal endoscopy  08/2006    GERD  . Abdominal US  01/2007    Fatty liver, no gallstones  . Doppler echocardiography  11/2009    Decreased EF of 35-40%  . Appendectomy     . Cardioversion  09/29/2011    Procedure: CARDIOVERSION;  Surgeon: Thayer Headings, MD;  Location: [facility] OR;  Service: Cardiovascular;  Laterality: [name];    Current Outpatient Prescriptions  Medication Sig Dispense Refill  . carvedilol (COREG) 25 MG tablet Take 1 tablet (25 mg total) by mouth 2 (two) times daily with a meal.  60 tablet  6  . digoxin (LANOXIN) 0.25 MG tablet Take 1 tablet (250 mcg total) by mouth daily.  30 tablet  12  . losartan-hydrochlorothiazide (HYZAAR) 50-12.5 MG per tablet Take 1 tablet by mouth daily.  30 tablet  11  . Multiple Vitamin (MULTIVITAMIN) tablet Take 1 tablet by mouth daily.        [name] Bicarbonate (ZEGERID OTC) 20-1100 MG CAPS Take 1 capsule by mouth daily.       . Rivaroxaban (XARELTO) 20 MG TABS tablet Take 1 tablet (20 mg total) by mouth daily with supper.  30 tablet  11  . spironolactone (ALDACTONE) 25 MG tablet Take 1 tablet (25 mg total) by mouth daily.  90 tablet  3   No current facility-administered medications for this visit.  No Known Allergies  Review of Systems negative except from HPI and PMH  Physical Exam BP 149/85  Pulse 85  Ht 6\' 4"  (1.93 m)  Wt 289 lb 9.6 oz (131.362 kg)  BMI 35.27 kg/m2 Well developed and well nourished in no acute distress HENT normal E scleral and icterus clear Neck Supple JVP flat; carotids brisk and full Clear to ausculation  Regular rate and rhythm, no murmurs gallops or rub Soft with active bowel sounds No clubbing cyanosis no Edema Alert and oriented, grossly normal motor and sensory function Skin Warm and Dry    Assessment and  Plan  Nonischemic cardiomyopathy  Left bundle branch block  Congestive heart failure-chronic-systolic  Hypertension  Sleep disorder breathing    we did discuss ICD implantation with potential benefits and risks including but not limited to death, perforation, inappropriate therapy, infection and lead dislodgment.   He and is wife agree and are  willing to proceed. We will plan to increase his losartan at the time of the procedure augment blood pressure control.  He is euvolemic and will continue him on current diuretics

## 2013-02-14 NOTE — Progress Notes (Signed)
RT placed patient on cpap with nasal mask for procedure per MD order. Patient did not know home settings.RT placed patient on auto titration mode 10/4 per patients comfort. HR 97 sats 100, RR 16. Patient tolerating well at this time.

## 2013-02-14 NOTE — Interval H&P Note (Signed)
ICD Criteria  Current LVEF (within 6 months):25%  NYHA Functional Classification: Class III  Heart Failure History:  Yes, Duration of heart failure since onset is > 9 months  Non-Ischemic Dilated Cardiomyopathy History:  Yes  > 6 months  Atrial Fibrillation/Atrial Flutter:  Yes.  Ventricular Tachycardia History:  No.  Cardiac Arrest History:  No  History of Syndromes with Risk of Sudden Death:  No.  Previous ICD:  No.  Electrophysiology Study: No.  Prior MI: Yes, Most recent MI timeframe is > 40 days.  PPM: No.  OSA:  Yes  Patient Life Expectancy of >=1 year: Yes.  Anticoagulation Therapy:  Patient is NOT on anticoagulation therapy.   Beta Blocker Therapy:  Yes.   Ace Inhibitor/ARB Therapy:  Yes.History and Physical Interval Note:  02/14/2013 2:48 PM  [name]  has presented today for surgery, with the diagnosis of Cardiomyopathy  The various methods of treatment have been discussed with the patient and family. After consideration of risks, benefits and other options for treatment, the patient has consented to  Procedure(s): BI-VENTRICULAR IMPLANTABLE CARDIOVERTER DEFIBRILLATOR  (CRT-D) ([name]) as a surgical intervention .  The patient's history has been reviewed, patient examined, no change in status, stable for surgery.  I have reviewed the patient's chart and labs.  Questions were answered to the patient's satisfaction.     Virl Axe

## 2013-02-15 ENCOUNTER — Ambulatory Visit (HOSPITAL_COMMUNITY): Payer: 59

## 2013-02-15 DIAGNOSIS — I428 Other cardiomyopathies: Secondary | ICD-10-CM

## 2013-02-15 NOTE — Discharge Instructions (Addendum)
Supplemental Discharge Instructions for  Pacemaker/Defibrillator Patients  Activity No heavy lifting or vigorous activity with your left/right arm for 6 to 8 weeks.  Do not raise your left/right arm above your head for one week.  Gradually raise your affected arm as drawn below.           01/24                      01/25                       01/26                      01/27       NO DRIVING for 1 week; you may begin driving on 43/15/4008. WOUND CARE   Keep the wound area clean and dry. You may shower but no tub baths, swimming pool or hot tub for 7-10 days until wound is completely healed.    The Dermabond (glue) on your wound will fall off on its own; do not pull it off. No bandage is needed on the site. DO NOT apply any creams, oils, or ointments to the wound area.   If you notice any drainage or discharge from the wound, any swelling or bruising at the site, or you develop a fever > 101? F after you are discharged home, call the office at once.  Special Instructions   You are still able to use cellular telephones; use the ear opposite the side where you have your pacemaker/defibrillator.  Avoid carrying your cellular phone near your device.   When traveling through airports, show security personnel your identification card to avoid being screened in the metal detectors.  Ask the security personnel to use the hand wand.   Avoid arc welding equipment, MRI testing (magnetic resonance imaging), TENS units (transcutaneous nerve stimulators).  Call the office for questions about other devices.   Avoid electrical appliances that are in poor condition or are not properly grounded.   Microwave ovens are safe to be near or to operate.  Additional information for defibrillator patients should your device go off:   If your device goes off ONCE and you feel fine afterward, notify the device clinic nurses.   If your device goes off ONCE and you do not feel well afterward, call 911.   If your  device goes off TWICE, call 911.   If your device goes off THREE times in one day, call 911.  DO NOT DRIVE YOURSELF OR A FAMILY MEMBER WITH A DEFIBRILLATOR TO THE HOSPITAL--CALL 911.   _____________________________________________________________________________________ Information on my medicine - XARELTO (Rivaroxaban)  Why was Xarelto prescribed for you? Xarelto was prescribed for you to reduce the risk of a blood clots forming after orthopedic surgery OR to reduce the risk of forming blood clots that cause a stroke if you have a medical condition called atrial fibrillation (a type of irregular heartbeat).  What do you need to know about xarelto ? Take your Xarelto ONCE DAILY at the same time every day with your evening meal. If you have difficulty swallowing the tablet whole, you may crush it and mix in applesauce just prior to taking your dose.  Take Xarelto exactly as prescribed by your doctor and DO NOT stop taking Xarelto without talking to the doctor who prescribed the medication.  Stopping without other stroke or VTE prevention medication to take the place of Xarelto may increase  your risk of developing a new clot or stroke.  Refill your prescription before you run out.  After discharge, you should have regular check-up appointments with your healthcare provider that is prescribing your Xarelto.  In the future your dose may need to be changed if your kidney function or weight changes by a significant amount.  What do you do if you miss a dose? If you are taking Xarelto ONCE DAILY and you miss a dose, take it as soon as you remember on the same day then continue your regularly scheduled once daily regimen the next day. Do not take two doses of Xarelto at the same time.   Important Safety Information A possible side effect of Xarelto is bleeding. You should call your healthcare provider right away if you experience any of the following:   Bleeding from an injury or your  nose that does not stop.   Unusual colored urine (red or dark brown) or unusual colored stools (red or black).   Unusual bruising for unknown reasons.   A serious fall or if you hit your head (even if there is no bleeding).  Some medicines may interact with Xarelto and might increase your risk of bleeding while on Xarelto. To help avoid this, consult your healthcare provider or pharmacist prior to using any new prescription or non-prescription medications, including herbals, vitamins, non-steroidal anti-inflammatory drugs (NSAIDs) and supplements.  This website has more information on Xarelto: [email or web address]

## 2013-02-15 NOTE — Progress Notes (Signed)
[place] for discharge  Some VT  -NS    F/u wound check and BMET at 6 weeks

## 2013-02-15 NOTE — Discharge Summary (Signed)
ELECTROPHYSIOLOGY DISCHARGE SUMMARY    Patient ID: Aaron Thompson,  MRN: [id number], DOB/AGE: Feb 16, 1960 53 y.o.  Admit date: 02/14/2013 Discharge date: 02/15/2013  Primary Care Physician: [name], MD Primary Cardiologist: [name], MD Primary EP: Caryl Comes, MD  Primary Discharge Diagnosis:  1. NICM with chronic systolic HF and LBBB, now s/p CRT-D implantation  Secondary Discharge Diagnoses:  1. HTN 2. DM 3. Paroxysmal atrial fibrillation 4. GERD  Procedures This Admission:  1. Biventricular ICD implantation RA lead - St Jude active fixation atrial lead, serial number [id number] RV lead - St Jude singlel coil active fixation defibrillator lead, model 7122 serial number [id number] LV lead - St. Jude 1458Q quadripolar LV lead serial number [id number] Device - St Jude ICD, serial number [id number]  History and Hospital Course:  Mr. Perfect is a 53 year old man with a NICM, chronic systolic HF and LBBB who has had persistent LV dysfunction despite optimal medical therapy. He was evaluated by Dr. Caryl Comes and BiV ICD implantation with recommended for CRT and primary prevention SCD. He presented yesterday and underwent CRT-D implantation with details as outlined above. He tolerated this procedure well without any immediate complication. He remains hemodynamically stable and afebrile. His chest xray shows stable lead placement without pneumothorax. His device interrogation shows normal BiV ICD function with stable lead parameters/measurements. His implant site is intact without significant bleeding or hematoma. He has been given discharge instructions including wound care and activity restrictions. He will follow-up in 10 days for wound check. He will follow-up in six weeks for BMET then with Dr. Caryl Comes in 3 months. There were no changes made to his medications. He has been seen, examined and deemed stable for discharge today by Dr. Jolyn Nap. Of note, he is taking BB (Coreg) and ARB (losartan).    Discharge Vitals: Blood pressure 121/85, pulse 84, temperature 97.7 F (36.5 C), temperature source Oral, resp. rate 20, height 6\' 4"  (1.93 m), weight 285 lb (129.275 kg), SpO2 97.00%.   Labs: Lab Results  Component Value Date   WBC 8.6 02/07/2013   HGB 16.0 02/07/2013   HCT 46.7 02/07/2013   MCV 84.3 02/07/2013   PLT 166.0 02/07/2013      Component Value Date/Time   NA 135 02/07/2013 1615   K 3.7 02/07/2013 1615   CL 100 02/07/2013 1615   CO2 28 02/07/2013 1615   GLUCOSE 179* 02/07/2013 1615   BUN 17 02/07/2013 1615   CREATININE 0.7 02/07/2013 1615   CALCIUM 9.0 02/07/2013 1615   GFRNONAA >90 09/29/2011 0645   GFRAA >90 09/29/2011 0645    Disposition:  The patient is being discharged in stable condition.  Follow-up: Follow-up Information   Follow up with [facility] On 02/22/2013. (At 10:30 AM for wound check)    Specialty:  Cardiology   Contact information:   [address] [place] [address] [phone]      Follow up with Ileene Hutchinson, PA-C On 03/27/2013. (At 2:00 PM (Dr. Olin Pia PA))    Specialty:  Cardiology   Contact information:   [address] [place] [address] [phone]       Follow up with Virl Axe, MD In 3 months. (Our office will call you with your appointment date and time)    Specialty:  Cardiology   Contact information:   [address] [place] [address] [phone]      Discharge Medications:    Medication  List         carvedilol 25 MG tablet  Commonly known as:  COREG  Take 1 tablet (25 mg total) by mouth 2 (two) times daily with a meal.     digoxin 0.25 MG tablet  Commonly known as:  LANOXIN  Take 1 tablet (250 mcg total) by mouth daily.     glucose blood test strip  Commonly known as:  ONE TOUCH ULTRA TEST  Check glucose twice daily and as needed, Dx. 250.00 uncontrolled DM     losartan-hydrochlorothiazide 50-12.5 MG per tablet  Commonly known as:  HYZAAR   Take 1 tablet by mouth daily.     multivitamin tablet  Take 1 tablet by mouth daily.     Rivaroxaban 20 MG Tabs tablet  Commonly known as:  XARELTO  Take 1 tablet (20 mg total) by mouth daily with supper.     spironolactone 25 MG tablet  Commonly known as:  ALDACTONE  Take 1 tablet (25 mg total) by mouth daily.     ZEGERID OTC 20-1100 MG Caps capsule  Generic drug:  Omeprazole-Sodium Bicarbonate  Take 1 capsule by mouth daily.       Duration of Discharge Encounter: Greater than 30 minutes including physician time.  Manson Passey, PA-C 02/15/2013, 9:37 AM

## 2013-02-15 NOTE — Progress Notes (Signed)
Pt provided with dc instructions and education. Pt verbalized understanding. MD aware of occasional VT. No concerns or complaints at this time. IV removed with tip intact. Heart monitor cleaned and returned to front. Dorna Bloom, RN

## 2013-02-15 NOTE — Progress Notes (Signed)
    Patient: AARIC DOLPH Date of Encounter: 02/15/2013, 7:43 AM Admit date: 02/14/2013     Subjective  Mr. Hiemstra has no complaints. He is eager to go home.   Objective  Physical Exam: Vitals: BP 121/85  Pulse 84  Temp(Src) 97.7 F (36.5 C) (Oral)  Resp 20  Ht 6\' 4"  (1.93 m)  Wt 285 lb (129.275 kg)  BMI 34.71 kg/m2  SpO2 97% General: Well developed, well appearing 53 year old male in no acute distress. Neck: Supple. JVD not elevated. Lungs: Clear bilaterally to auscultation without wheezes, rales, or rhonchi. Breathing is unlabored. Heart: RRR S1 S2 without murmurs, rubs, or gallops.  Abdomen: Soft, non-distended. Extremities: No clubbing or cyanosis. No edema.  Distal pedal pulses are 2+ and equal bilaterally. Neuro: Alert and oriented X 3. Moves all extremities spontaneously. No focal deficits. Skin: Left upper chest / implant site intact without bleeding or hematoma.  Intake/Output:  Intake/Output Summary (Last 24 hours) at 02/15/13 0743 Last data filed at 02/15/13 0548  Gross per 24 hour  Intake    220 ml  Output      0 ml  Net    220 ml    Inpatient Medications:  . carvedilol  25 mg Oral BID WC  .  ceFAZolin (ANCEF) IV  1 g Intravenous Q6H  . digoxin  250 mcg Oral Daily  . hydrochlorothiazide  12.5 mg Oral Daily  . losartan  50 mg Oral Daily  . pantoprazole  40 mg Oral Daily  . Rivaroxaban  20 mg Oral Q supper  . spironolactone  25 mg Oral Daily    Labs:    Component Value Date/Time   WBC 8.6 02/07/2013 1615   RBC 5.53 02/07/2013 1615   HGB 16.0 02/07/2013 1615   HCT 46.7 02/07/2013 1615   PLT 166.0 02/07/2013 1615   MCV 84.3 02/07/2013 1615   MCH 29.2 09/29/2011 0645   MCHC 34.4 02/07/2013 1615   RDW 13.6 02/07/2013 1615   LYMPHSABS 2.3 02/07/2013 1615   MONOABS 0.8 02/07/2013 1615   EOSABS 0.2 02/07/2013 1615   BASOSABS 0.0 02/07/2013 1615      Component Value Date/Time   NA 135 02/07/2013 1615   K 3.7 02/07/2013 1615   CL 100 02/07/2013 1615   CO2 28  02/07/2013 1615   GLUCOSE 179* 02/07/2013 1615   BUN 17 02/07/2013 1615   CREATININE 0.7 02/07/2013 1615   CALCIUM 9.0 02/07/2013 1615   GFRNONAA >90 09/29/2011 0645   GFRAA >90 09/29/2011 0645    Radiology/Studies: Chest x-ray pending Device interrogation: performed by industry this AM - normal CRT-D function Telemetry: A sensed V paced; NSVT overnight   Assessment and Plan  1. NICM with chronic systolic HF and LBBB, now s/p CRT-D implant  Mr. Sperl is doing well post implant. Wound is intact without bleeding or hematoma. Chest x-ray pending. Device interrogation shows normal CRT-D function. Reviewed wound care, activity restrictions and follow-up with him. Plan for home this AM if chest x-ray normal.  Signed, Conlee Sliter PA-C

## 2013-02-22 ENCOUNTER — Encounter: Payer: Self-pay | Admitting: Internal Medicine

## 2013-02-22 ENCOUNTER — Ambulatory Visit (INDEPENDENT_AMBULATORY_CARE_PROVIDER_SITE_OTHER): Payer: 59 | Admitting: *Deleted

## 2013-02-22 DIAGNOSIS — I4891 Unspecified atrial fibrillation: Secondary | ICD-10-CM

## 2013-02-22 DIAGNOSIS — I472 Ventricular tachycardia: Secondary | ICD-10-CM

## 2013-02-22 DIAGNOSIS — I4729 Other ventricular tachycardia: Secondary | ICD-10-CM

## 2013-02-22 DIAGNOSIS — I5022 Chronic systolic (congestive) heart failure: Secondary | ICD-10-CM

## 2013-02-22 DIAGNOSIS — I428 Other cardiomyopathies: Secondary | ICD-10-CM

## 2013-02-22 LAB — MDC_IDC_ENUM_SESS_TYPE_INCLINIC
Battery Remaining Longevity: 49.2 mo
Brady Statistic RA Percent Paced: 0.15 %
Brady Statistic RV Percent Paced: 87 %
Date Time Interrogation Session: 20150129112428
HIGH POWER IMPEDANCE MEASURED VALUE: 65.25 Ohm
Implantable Pulse Generator Serial Number: 7133975
Lead Channel Impedance Value: 525 Ohm
Lead Channel Pacing Threshold Amplitude: 0.75 V
Lead Channel Pacing Threshold Amplitude: 0.75 V
Lead Channel Pacing Threshold Amplitude: 1 V
Lead Channel Pacing Threshold Amplitude: 1 V
Lead Channel Pacing Threshold Amplitude: 1 V
Lead Channel Pacing Threshold Pulse Width: 0.5 ms
Lead Channel Pacing Threshold Pulse Width: 0.5 ms
Lead Channel Pacing Threshold Pulse Width: 0.5 ms
Lead Channel Sensing Intrinsic Amplitude: 11.8 mV
Lead Channel Setting Pacing Amplitude: 3.5 V
Lead Channel Setting Pacing Amplitude: 3.5 V
Lead Channel Setting Pacing Pulse Width: 0.5 ms
Lead Channel Setting Sensing Sensitivity: 0.5 mV
MDC IDC MSMT LEADCHNL LV IMPEDANCE VALUE: 587.5 Ohm
MDC IDC MSMT LEADCHNL LV PACING THRESHOLD PULSEWIDTH: 0.5 ms
MDC IDC MSMT LEADCHNL LV PACING THRESHOLD PULSEWIDTH: 0.5 ms
MDC IDC MSMT LEADCHNL RA PACING THRESHOLD AMPLITUDE: 1 V
MDC IDC MSMT LEADCHNL RA PACING THRESHOLD PULSEWIDTH: 0.5 ms
MDC IDC MSMT LEADCHNL RA SENSING INTR AMPL: 5 mV
MDC IDC MSMT LEADCHNL RV IMPEDANCE VALUE: 462.5 Ohm
MDC IDC SET LEADCHNL LV PACING AMPLITUDE: 3.5 V
MDC IDC SET LEADCHNL RV PACING PULSEWIDTH: 0.5 ms
MDC IDC SET ZONE DETECTION INTERVAL: 250 ms
MDC IDC SET ZONE DETECTION INTERVAL: 280 ms
Zone Setting Detection Interval: 330 ms

## 2013-02-22 NOTE — Progress Notes (Signed)
Wound check appointment. No steri-strips, surgical glue present. Wound without redness or edema. Incision edges approximated, wound well healed. Normal device function. Thresholds, sensing, and impedances consistent with implant measurements. Device programmed at 3.5V for extra safety margin until 3 month visit. Histogram distribution appropriate for patient and level of activity. 13% mode switch + xarelto. 32 NSVT--- only 2 w/ EGM, both afib w/ RVR. Patient educated about wound care, arm mobility, lifting restrictions, shock plan.   ROV w/ SK 05/24/13 @ 4:15.

## 2013-02-28 ENCOUNTER — Institutional Professional Consult (permissible substitution): Payer: 59 | Admitting: Pulmonary Disease

## 2013-02-28 ENCOUNTER — Telehealth: Payer: Self-pay | Admitting: *Deleted

## 2013-02-28 NOTE — Telephone Encounter (Signed)
Patient was referred to our office for a Sleep Consult appointment with Dr Gwenette Greet.  Scheduled for appt 02/28/13 at 245. Patient cancelled appointment on 02/27/13 - refused to reschedule.  Will forward to Dr Caryl Comes as Juluis Rainier

## 2013-03-03 ENCOUNTER — Encounter: Payer: Self-pay | Admitting: Internal Medicine

## 2013-03-15 ENCOUNTER — Other Ambulatory Visit: Payer: Self-pay | Admitting: *Deleted

## 2013-03-15 DIAGNOSIS — G4730 Sleep apnea, unspecified: Secondary | ICD-10-CM

## 2013-03-16 ENCOUNTER — Telehealth: Payer: Self-pay | Admitting: Pulmonary Disease

## 2013-03-16 NOTE — Telephone Encounter (Signed)
Pt needs consult apptm with me for sleep apnea management.  Dr. Caryl Comes has sent referral.   Please find spot for him relatively soon.

## 2013-03-21 NOTE — Telephone Encounter (Signed)
Left detailed message on machine stating for patient to contact our office ASAP to reschedule Consult appt for sleep with Nenahnezad.

## 2013-03-27 ENCOUNTER — Ambulatory Visit (INDEPENDENT_AMBULATORY_CARE_PROVIDER_SITE_OTHER): Payer: 59 | Admitting: Cardiology

## 2013-03-27 ENCOUNTER — Encounter: Payer: Self-pay | Admitting: *Deleted

## 2013-03-27 ENCOUNTER — Encounter: Payer: Self-pay | Admitting: Internal Medicine

## 2013-03-27 ENCOUNTER — Encounter: Payer: Self-pay | Admitting: Cardiology

## 2013-03-27 VITALS — BP 124/84 | HR 83 | Ht 76.0 in | Wt 292.0 lb

## 2013-03-27 DIAGNOSIS — I4891 Unspecified atrial fibrillation: Secondary | ICD-10-CM

## 2013-03-27 DIAGNOSIS — Z95810 Presence of automatic (implantable) cardiac defibrillator: Secondary | ICD-10-CM

## 2013-03-27 DIAGNOSIS — I5022 Chronic systolic (congestive) heart failure: Secondary | ICD-10-CM

## 2013-03-27 DIAGNOSIS — I428 Other cardiomyopathies: Secondary | ICD-10-CM

## 2013-03-27 DIAGNOSIS — I43 Cardiomyopathy in diseases classified elsewhere: Secondary | ICD-10-CM

## 2013-03-27 LAB — MDC_IDC_ENUM_SESS_TYPE_INCLINIC
Battery Remaining Longevity: 69.6 mo
Brady Statistic RA Percent Paced: 1 %
Date Time Interrogation Session: 20150303174247
HighPow Impedance: 79.875
HighPow Impedance: 80 Ohm
Lead Channel Impedance Value: 450 Ohm
Lead Channel Impedance Value: 540 Ohm
Lead Channel Pacing Threshold Amplitude: 1 V
Lead Channel Pacing Threshold Amplitude: 2.5 V
Lead Channel Pacing Threshold Pulse Width: 0.5 ms
Lead Channel Pacing Threshold Pulse Width: 0.5 ms
Lead Channel Pacing Threshold Pulse Width: 0.8 ms
Lead Channel Sensing Intrinsic Amplitude: 11.8 mV
Lead Channel Setting Pacing Amplitude: 1.625
Lead Channel Setting Pacing Amplitude: 3.125
MDC IDC MSMT LEADCHNL LV IMPEDANCE VALUE: 850 Ohm
MDC IDC MSMT LEADCHNL LV PACING THRESHOLD AMPLITUDE: 2 V
MDC IDC MSMT LEADCHNL LV PACING THRESHOLD PULSEWIDTH: 0.5 ms
MDC IDC MSMT LEADCHNL RA PACING THRESHOLD AMPLITUDE: 0.5 V
MDC IDC MSMT LEADCHNL RA SENSING INTR AMPL: 5 mV
MDC IDC PG SERIAL: 7133975
MDC IDC SET LEADCHNL LV PACING PULSEWIDTH: 0.5 ms
MDC IDC SET LEADCHNL RV PACING AMPLITUDE: 2 V
MDC IDC SET LEADCHNL RV PACING PULSEWIDTH: 0.5 ms
MDC IDC SET LEADCHNL RV SENSING SENSITIVITY: 0.5 mV
MDC IDC SET ZONE DETECTION INTERVAL: 330 ms
MDC IDC STAT BRADY RV PERCENT PACED: 99.84 %
Zone Setting Detection Interval: 250 ms
Zone Setting Detection Interval: 280 ms

## 2013-03-27 LAB — BASIC METABOLIC PANEL
BUN: 14 mg/dL (ref 6–23)
CHLORIDE: 96 meq/L (ref 96–112)
CO2: 26 mEq/L (ref 19–32)
CREATININE: 0.83 mg/dL (ref 0.50–1.35)
Calcium: 9.3 mg/dL (ref 8.4–10.5)
GLUCOSE: 265 mg/dL — AB (ref 70–99)
POTASSIUM: 3.8 meq/L (ref 3.5–5.3)
Sodium: 133 mEq/L — ABNORMAL LOW (ref 135–145)

## 2013-03-27 NOTE — Progress Notes (Signed)
ELECTROPHYSIOLOGY OFFICE NOTE   Patient ID: LEGACY RAFFETTO MRN: [id number], DOB/AGE: [birth year]   Date of Visit: 03/27/2013  Primary Physician: [name], MD Primary Cardiologist: [name], MD Primary EP: Caryl Comes, MD Reason for Visit: EP/device follow-up  History of Present Illness  Aaron Thompson is a 53 y.o. male with a NICM, chronic systolic HF and LBBB who has had persistent LV dysfunction despite optimal medical therapy. He was evaluated by Dr. Caryl Comes and BiV ICD implantation with recommended for CRT and primary prevention SCD. He underwent CRT-D implantation on 02/14/2013 and presents today for 6-week follow-up.  Since CRT-D implant, he reports he is doing well and has no complaints. He is thrilled with his CHF improvement. He has noticed a significant increase in his energy level and stamina. His wife reports, "He's no longer dragging behind when we're out shopping." He has noticed he can now walk farther without any SOB or fatigue, stating "Before I wasn't able to walk up my street one time, one way but now I can make several trips." He denies chest pain or shortness of breath. He denies palpitations, dizziness, near syncope or syncope. He denies LE swelling, orthopnea, PND or recent weight gain. He is compliant and tolerating medications without difficulty.  Past Medical History Past Medical History  Diagnosis Date  . Hypertension   . DJD (degenerative joint disease)   . Dermatophytosis of the body   . Esophageal reflux   . Other chronic nonalcoholic liver disease     fatty liver  . Unspecified hearing loss   . Anxiety state, unspecified   . Non-ischemic cardiomyopathy     a.  cath 6/11: OM2 30%; EF 30%;  b. echo 2/12: EF 30%, mild MR, mild LAE;  c.  Echo (11/14):  Mod LVH, EF 25-30%, diff HK, mod LAE.   [name] Atrial fibrillation     a. s/p DCCV 6/11; previously on Pradaxa;  b. Event Monitor 2012->No PAF;  c. 09/2011 s/p DCCV ->Xarelto started.  [name] LBBB (left bundle branch block)    intermittent  . Hx of colonic polyps   . Osteoarthrosis, unspecified whether generalized or localized, hand   . Diarrhea   . Gastroenteritis   . Abdominal pain, right upper quadrant   . Folliculitis   . Automatic implantable cardioverter-defibrillator in situ   . CHF (congestive heart failure)   . OSA on CPAP   . Type II diabetes mellitus   . Calculus of kidney 1981    "passed it on my own"    Past Surgical History Past Surgical History  Procedure Laterality Date  . Neck surgery      plate/fusion  . Bilateral knee arthroscopy    . Cardioversion  06/2009    Non ischemic cardiomyopathy - cath and echo  . Stress cardiolite  12/02/1998    Normal EF 52%  . Stress cardiolite  08/2005    Normal, EF 42%  . Cardiovascular stress test  2006    Neg per pt  . Upper gastrointestinal endoscopy  08/2006    GERD  . Abdominal US  01/2007    Fatty liver, no gallstones  . Doppler echocardiography  11/2009    Decreased EF of 35-40%  . Appendectomy    . Cardioversion  09/29/2011    Procedure: CARDIOVERSION;  Surgeon: Thayer Headings, MD;  Location: [facility] OR;  Service: Cardiovascular;  Laterality: [name];  . Bi-ventricular implantable cardioverter defibrillator  (crt-d)  02/14/2013    Allergies/Intolerances No Known Allergies  Current Home Medications Current Outpatient Prescriptions  Medication Sig Dispense Refill  . carvedilol (COREG) 25 MG tablet Take 1 tablet (25 mg total) by mouth 2 (two) times daily with a meal.  60 tablet  6  . digoxin (LANOXIN) 0.25 MG tablet Take 1 tablet (250 mcg total) by mouth daily.  30 tablet  12  . glucose blood (ONE TOUCH ULTRA TEST) test strip Check glucose twice daily and as needed, Dx. 250.00 uncontrolled DM  100 each  3  . losartan-hydrochlorothiazide (HYZAAR) 50-12.5 MG per tablet Take 1 tablet by mouth daily.  30 tablet  11  . Multiple Vitamin (MULTIVITAMIN) tablet Take 1 tablet by mouth daily.        [name] Bicarbonate (ZEGERID OTC) 20-1100 MG  CAPS Take 1 capsule by mouth daily.       . Rivaroxaban (XARELTO) 20 MG TABS tablet Take 1 tablet (20 mg total) by mouth daily with supper.  30 tablet  11  . spironolactone (ALDACTONE) 25 MG tablet Take 1 tablet (25 mg total) by mouth daily.  90 tablet  3   No current facility-administered medications for this visit.    Social History History   Social History  . Marital Status: Married    Spouse Name: [name]    Number of Children: 2  . Years of Education: [name]   Occupational History  . PAINTER Unemployed   Social History Main Topics  . Smoking status: Never Smoker   . Smokeless tobacco: Former Systems developer    Types: Snuff     Comment: 02/14/2013 "quit snuff in 1006"  . Alcohol Use: No  . Drug Use: No  . Sexual Activity: Yes   Other Topics Concern  . Not on file   Social History Narrative   Lives in [place] with wife.  Works @ [employer] in [employer].  Not routinely exercising.     Review of Systems General: No chills, fever, night sweats or weight changes Cardiovascular: No chest pain, dyspnea on exertion, edema, orthopnea, palpitations, paroxysmal nocturnal dyspnea Dermatological: No rash, lesions or masses Respiratory: No cough, dyspnea Urologic: No hematuria, dysuria Abdominal: No nausea, vomiting, diarrhea, bright red blood per rectum, melena, or hematemesis Neurologic: No visual changes, weakness, changes in mental status All other systems reviewed and are otherwise negative except as noted above.  Physical Exam Vitals: Blood pressure 124/84, pulse 83, height 6\' 4"  (1.93 m), weight 292 lb (132.45 kg).  General: Well developed, well appearing 53 y.o. male in no acute distress. HEENT: Normocephalic, atraumatic. EOMs intact. Sclera nonicteric. Oropharynx clear.  Neck: Supple. No JVD. Lungs: Respirations regular and unlabored, CTA bilaterally. No wheezes, rales or rhonchi. Heart: RRR. S1, S2 present. No murmurs, rub, S3 or S4. Abdomen: Soft, non-tender, non-distended.  BS present x 4 quadrants. No hepatosplenomegaly.  Extremities: No clubbing, cyanosis or edema. PT/Radials 2+ and equal bilaterally. Psych: Normal affect. Neuro: Alert and oriented X 3. Moves all extremities spontaneously. Skin: Left upper chest / implant site intact and well healed.   Diagnostics 12-lead ECG today - A sensed V paced at 83 bpm; QRS duration 138 (QRS 164 prior to implant) Device interrogation today - Thresholds and sensing consistent with previous device measurements. Lead impedance trends stable over time. 1 mode switch episode recorded, 2 seconds in duration, EGM shows atrial tachycardia with 1:1 conduction. No Afib since last check. No ventricular arrhythmia episodes recorded. Patient bi-ventricularly pacing >99% of the time. Device programmed with appropriate safety margins. Heart failure diagnostics reviewed and trends  are stable for patient. Audible/vibratory alerts demonstrated for patient. No changes made this session. Estimated longevity 5.8 - 6.4 years.   Assessment and Plan  1. NICM with chronic systolic HF and LBBB, now s/p CRT-D implantation - normal device function - no programming changes made - may return to work on 04/09/2013 with work duties / responsibilities to be performed with both feet securely on the ground (no longer able to work on [employer] / lifts / ladders) - HF symptoms much improved and appears to be CRT responder; he will continue guideline-directed medical therapy - check BMET today - enroll for remote ICD follow-up every 3 months - return for follow-up with Dr. Caryl Comes in 6 weeks for 3 month post implant visit   2. Paroxysmal atrial fibrillation - stable - continue BB for rate control and Xarelto for stroke prevention  Signed, Ileene Hutchinson, PA-C 03/27/2013, 4:33 PM

## 2013-03-27 NOTE — Patient Instructions (Addendum)
Your physician recommends that you have lab work today: BMET  Your physician recommends that you keep your  follow-up appointment with Dr. Caryl Comes 05/24/13 at 4:00

## 2013-03-28 ENCOUNTER — Telehealth: Payer: Self-pay | Admitting: Internal Medicine

## 2013-03-28 NOTE — Telephone Encounter (Signed)
Pt refused to schedule Sleep Consult Saw PA at Dr Caryl Comes office 03/27/13 and expressed to her that he did not plan on making appt for sleep consult. Pt states that he is too claustrophobic to wear a mask to sleep and does not want to try. He understands that he needs this appt and that his body may need this machine but still refuses.  Pt states that since having Defib placed 02/14/13 that he no longer snores.   Will send to [facility] as FYI. Nothing further needed.

## 2013-03-28 NOTE — Telephone Encounter (Signed)
New message     Pls fax FMLA papers to [phone] attn rosa mcdougal.  Pt brought FMLA papers to our office and pd 25.00

## 2013-03-28 NOTE — Telephone Encounter (Signed)
noted 

## 2013-04-06 NOTE — Telephone Encounter (Signed)
Paperwork signed by Dr. Caryl Comes earlier this week and given to medical records

## 2013-04-17 ENCOUNTER — Encounter: Payer: 59 | Admitting: Cardiology

## 2013-04-20 ENCOUNTER — Encounter: Payer: Self-pay | Admitting: *Deleted

## 2013-04-20 ENCOUNTER — Telehealth: Payer: Self-pay | Admitting: *Deleted

## 2013-04-20 NOTE — Telephone Encounter (Signed)
Pt called medical records this morning asking about work release letter. Per Dr. Caryl Comes pt released with no restrictions. Letter to return to work left at front desk for pt pick up. Pt agreeable to plan and states he will pick up this afternoon.

## 2013-04-22 NOTE — Progress Notes (Signed)
Scheduled in error This encounter was created in error - please disregard. Office visit scheduled in error.

## 2013-04-25 ENCOUNTER — Encounter: Payer: Self-pay | Admitting: Internal Medicine

## 2013-04-25 ENCOUNTER — Ambulatory Visit (INDEPENDENT_AMBULATORY_CARE_PROVIDER_SITE_OTHER): Payer: 59 | Admitting: Internal Medicine

## 2013-04-25 ENCOUNTER — Encounter: Payer: Self-pay | Admitting: *Deleted

## 2013-04-25 VITALS — BP 147/89 | HR 82 | Ht 76.0 in | Wt 292.0 lb

## 2013-04-25 DIAGNOSIS — I428 Other cardiomyopathies: Secondary | ICD-10-CM

## 2013-04-25 MED ORDER — ASPIRIN EC 81 MG PO TBEC: 81.0000 mg | DELAYED_RELEASE_TABLET | Freq: Every day | ORAL | Status: DC

## 2013-04-25 NOTE — Progress Notes (Signed)
Patient Care Team: Abner Greenspan, MD as PCP - General   HPI  Aaron Thompson is a 53 y.o. male Seen in followup for ICD implanted for nonischemic cardiomyopathy and left bundle branch block.  He was noted on  his device to have atrial fibrillation and he was started on Rivaroxaban for stroke reduction prophylaxis    Past Medical History  Diagnosis Date  . Hypertension   . DJD (degenerative joint disease)   . Dermatophytosis of the body   . Esophageal reflux   . Other chronic nonalcoholic liver disease     fatty liver  . Unspecified hearing loss   . Anxiety state, unspecified   . Non-ischemic cardiomyopathy     a.  cath 6/11: OM2 30%; EF 30%;  b. echo 2/12: EF 30%, mild MR, mild LAE;  c.  Echo (11/14):  Mod LVH, EF 25-30%, diff HK, mod LAE.   [name] Atrial fibrillation     a. s/p DCCV 6/11; previously on Pradaxa;  b. Event Monitor 2012->No PAF;  c. 09/2011 s/p DCCV ->Xarelto started.  [name] LBBB (left bundle branch block)     intermittent  . Hx of colonic polyps   . Osteoarthrosis, unspecified whether generalized or localized, hand   . Diarrhea   . Gastroenteritis   . Abdominal pain, right upper quadrant   . Folliculitis   . Automatic implantable cardioverter-defibrillator in situ   . CHF (congestive heart failure)   . OSA on CPAP   . Type II diabetes mellitus   . Calculus of kidney 1981    "passed it on my own"    Past Surgical History  Procedure Laterality Date  . Neck surgery      plate/fusion  . Bilateral knee arthroscopy    . Cardioversion  06/2009    Non ischemic cardiomyopathy - cath and echo  . Stress cardiolite  12/02/1998    Normal EF 52%  . Stress cardiolite  08/2005    Normal, EF 42%  . Cardiovascular stress test  2006    Neg per pt  . Upper gastrointestinal endoscopy  08/2006    GERD  . Abdominal US  01/2007    Fatty liver, no gallstones  . Doppler echocardiography  11/2009    Decreased EF of 35-40%  . Appendectomy    . Cardioversion  09/29/2011   Procedure: CARDIOVERSION;  Surgeon: Thayer Headings, MD;  Location: [facility];  Service: Cardiovascular;  Laterality: [name];  . Bi-ventricular implantable cardioverter defibrillator  (crt-d)  02/14/2013    Current Outpatient Prescriptions  Medication Sig Dispense Refill  . carvedilol (COREG) 25 MG tablet Take 1 tablet (25 mg total) by mouth 2 (two) times daily with a meal.  60 tablet  6  . digoxin (LANOXIN) 0.25 MG tablet Take 1 tablet (250 mcg total) by mouth daily.  30 tablet  12  . glucose blood (ONE TOUCH ULTRA TEST) test strip Check glucose twice daily and as needed, Dx. 250.00 uncontrolled DM  100 each  3  . losartan-hydrochlorothiazide (HYZAAR) 50-12.5 MG per tablet Take 1 tablet by mouth daily.  30 tablet  11  . Multiple Vitamin (MULTIVITAMIN) tablet Take 1 tablet by mouth daily.        [name] Bicarbonate (ZEGERID OTC) 20-1100 MG CAPS Take 1 capsule by mouth daily.       . Rivaroxaban (XARELTO) 20 MG TABS tablet Take 1 tablet (20 mg total) by mouth daily with supper.  30 tablet  11  . spironolactone (ALDACTONE) 25 MG tablet Take 1 tablet (25 mg total) by mouth daily.  90 tablet  3   No current facility-administered medications for this visit.    No Known Allergies  Review of Systems negative except from HPI and PMH  Physical Exam BP 147/89  Pulse 82  Ht 6\' 4"  (1.93 m)  Wt 292 lb (132.45 kg)  BMI 35.56 kg/m2 Well developed and well nourished in no acute distress HENT normal E scleral and icterus clear Neck Supple JVP flat; carotids brisk and full Clear to ausculation  Regular rate and rhythm, no murmurs gallops or rub Soft with active bowel sounds No clubbing cyanosis no Edema Alert and oriented, grossly normal motor and sensory function Skin Warm and Dry    Assessment and  Plan  Nonischemic cardiomyopathy  Left bundle branch block  Congestive heart failure-chronic-systolic  Hypertension  Sleep disorder breathing    we did discuss ICD implantation  with potential benefits and risks including but not limited to death, perforation, inappropriate therapy, infection and lead dislodgment.   He and is wife agree and are willing to proceed. We will plan to increase his losartan at the time of the procedure augment blood pressure control.  He is euvolemic and will continue him on current diuretics

## 2013-04-25 NOTE — Patient Instructions (Signed)
Your physician has recommended you make the following change in your medication:  1) Stop Xarelto 2) Start Aspirin 81 mg daily  Keep current follow up appointment on 4/30 with Dr. Caryl Comes

## 2013-05-24 ENCOUNTER — Encounter: Payer: 59 | Admitting: Internal Medicine

## 2013-05-24 ENCOUNTER — Ambulatory Visit (INDEPENDENT_AMBULATORY_CARE_PROVIDER_SITE_OTHER): Payer: 59 | Admitting: Internal Medicine

## 2013-05-24 ENCOUNTER — Encounter: Payer: Self-pay | Admitting: *Deleted

## 2013-05-24 ENCOUNTER — Encounter: Payer: Self-pay | Admitting: Internal Medicine

## 2013-05-24 VITALS — BP 122/82 | HR 86 | Ht 73.0 in | Wt 291.0 lb

## 2013-05-24 DIAGNOSIS — I5022 Chronic systolic (congestive) heart failure: Secondary | ICD-10-CM

## 2013-05-24 DIAGNOSIS — I4891 Unspecified atrial fibrillation: Secondary | ICD-10-CM

## 2013-05-24 DIAGNOSIS — Z95810 Presence of automatic (implantable) cardiac defibrillator: Secondary | ICD-10-CM

## 2013-05-24 DIAGNOSIS — I428 Other cardiomyopathies: Secondary | ICD-10-CM

## 2013-05-24 LAB — MDC_IDC_ENUM_SESS_TYPE_INCLINIC
Implantable Pulse Generator Serial Number: 7133975
Lead Channel Setting Pacing Amplitude: 1.625
Lead Channel Setting Pacing Amplitude: 2 V
Lead Channel Setting Pacing Amplitude: 3.125
Lead Channel Setting Pacing Pulse Width: 0.5 ms
Lead Channel Setting Pacing Pulse Width: 0.5 ms
MDC IDC SET LEADCHNL RV SENSING SENSITIVITY: 0.5 mV
MDC IDC SET ZONE DETECTION INTERVAL: 250 ms
MDC IDC SET ZONE DETECTION INTERVAL: 280 ms
MDC IDC SET ZONE DETECTION INTERVAL: 330 ms

## 2013-05-24 LAB — BASIC METABOLIC PANEL
BUN: 23 mg/dL (ref 6–23)
CALCIUM: 9.1 mg/dL (ref 8.4–10.5)
CO2: 26 mEq/L (ref 19–32)
CREATININE: 0.7 mg/dL (ref 0.4–1.5)
Chloride: 99 mEq/L (ref 96–112)
GFR: 130.36 mL/min (ref >60.00)
GLUCOSE: 286 mg/dL — AB (ref 70–99)
Potassium: 3.5 mEq/L (ref 3.5–5.1)
Sodium: 134 mEq/L — ABNORMAL LOW (ref 135–145)

## 2013-05-24 NOTE — Patient Instructions (Addendum)
Remote monitoring is used to monitor your Pacemaker of ICD from home. This monitoring reduces the number of office visits required to check your device to one time per year. It allows [place] to keep an eye on the functioning of your device to ensure it is working properly. You are scheduled for a device check from home on August 23, 2013. You may send your transmission at any time that day. If you have a wireless device, the transmission will be sent automatically. After your physician reviews your transmission, you will receive a postcard with your next transmission date.  Your physician has requested that you have an echocardiogram. Echocardiography is a painless test that uses sound waves to create images of your heart. It provides your doctor with information about the size and shape of your heart and how well your heart's chambers and valves are working. This procedure takes approximately one hour. There are no restrictions for this procedure.  Your physician wants you to follow-up in:  January 2016 with Dr Caryl Comes.  You will receive a reminder letter in the mail two months in advance. If you don't receive a letter, please call our office to schedule the follow-up appointment.  Your physician recommends that you return for lab work today: Digoxin, BMET  Your physician recommends that you schedule a follow-up appointment in: 4 months with Dr. [name]

## 2013-05-24 NOTE — Progress Notes (Signed)
PCP: [name], MD Primary Cardiologist: [name] is a 53 y.o. male who presents today for routine electrophysiology followup.  Since last being seen in our clinic, the patient reports doing very well. He is symptomatically improved since CRT implantation in January.  His history of known ischemic cardiomyopathy and left bundle branch block. He also has a history of paroxysmal atrial fibrillation   Today, he denies symptoms of palpitations, chest pain, shortness of breath,  lower extremity edema, dizziness, presyncope, syncope, or ICD shocks.  The patient is otherwise without complaint today.   Past Medical History  Diagnosis Date  . Hypertension   . DJD (degenerative joint disease)   . Dermatophytosis of the body   . Esophageal reflux   . Other chronic nonalcoholic liver disease     fatty liver  . Unspecified hearing loss   . Anxiety state, unspecified   . Non-ischemic cardiomyopathy     a.  cath 6/11: OM2 30%; EF 30%;  b. echo 2/12: EF 30%, mild MR, mild LAE;  c.  Echo (11/14):  Mod LVH, EF 25-30%, diff HK, mod LAE.   Aaron Thompson Atrial fibrillation     a. s/p DCCV 6/11; previously on Pradaxa;  b. Event Monitor 2012->No PAF;  c. 09/2011 s/p DCCV ->Xarelto started.  Aaron Thompson LBBB (left bundle branch block)     intermittent  . Hx of colonic polyps   . Osteoarthrosis, unspecified whether generalized or localized, hand   . Diarrhea   . Gastroenteritis   . Abdominal pain, right upper quadrant   . Folliculitis   . Automatic implantable cardioverter-defibrillator in situ   . CHF (congestive heart failure)   . OSA on CPAP   . Type II diabetes mellitus   . Calculus of kidney 1981    "passed it on my own"   Past Surgical History  Procedure Laterality Date  . Neck surgery      plate/fusion  . Bilateral knee arthroscopy    . Cardioversion  06/2009    Non ischemic cardiomyopathy - cath and echo  . Stress cardiolite  12/02/1998    Normal EF 52%  . Stress cardiolite  08/2005   Normal, EF 42%  . Cardiovascular stress test  2006    Neg per pt  . Upper gastrointestinal endoscopy  08/2006    GERD  . Abdominal US  01/2007    Fatty liver, no gallstones  . Doppler echocardiography  11/2009    Decreased EF of 35-40%  . Appendectomy    . Cardioversion  09/29/2011    Procedure: CARDIOVERSION;  Surgeon: Thayer Headings, MD;  Location: [facility];  Service: Cardiovascular;  Laterality: [name];  . Bi-ventricular implantable cardioverter defibrillator  (crt-d)  02/14/2013    Current Outpatient Prescriptions  Medication Sig Dispense Refill  . aspirin EC 81 MG tablet Take 1 tablet (81 mg total) by mouth daily.  90 tablet  3  . carvedilol (COREG) 25 MG tablet Take 1 tablet (25 mg total) by mouth 2 (two) times daily with a meal.  60 tablet  6  . digoxin (LANOXIN) 0.25 MG tablet Take 1 tablet (250 mcg total) by mouth daily.  30 tablet  12  . glucose blood (ONE TOUCH ULTRA TEST) test strip Check glucose twice daily and as needed, Dx. 250.00 uncontrolled DM  100 each  3  . losartan-hydrochlorothiazide (HYZAAR) 50-12.5 MG per tablet Take 1 tablet by mouth daily.  30 tablet  11  . Multiple Vitamin (MULTIVITAMIN) tablet  Take 1 tablet by mouth daily.        [name] Bicarbonate (ZEGERID OTC) 20-1100 MG CAPS Take 1 capsule by mouth daily.       Aaron Thompson spironolactone (ALDACTONE) 25 MG tablet Take 1 tablet (25 mg total) by mouth daily.  90 tablet  3   No current facility-administered medications for this visit.    Physical Exam: Filed Vitals:   05/24/13 1500  BP: 122/82  Pulse: 86  Height: 6\' 1"  (1.854 m)  Weight: 291 lb (131.997 kg)    GEN- The patient is well appearing, alert and oriented x 3 today.   Head- normocephalic, atraumatic Eyes-  Sclera clear, conjunctiva pink Ears- hearing intact Oropharynx- clear Lungs- Clear to ausculation bilaterally, normal work of breathing Chest- ICD pocket is well healed Heart- Regular rate and rhythm, no murmurs, rubs or gallops, PMI not  laterally displaced GI- soft, NT, ND, + BS Extremities- no clubbing, cyanosis, or edema  ICD interrogation- reviewed in detail today,  See PACEART report  Assessment and Plan:  Nonischemic cardiomyopathy  Hypertension  CRT-D-St. Jude  1.  Chronic systolic dysfunction euvolemic today Stable on an appropriate medical regimen Normal ICD function See Pace Art report No changes today  Much improved. We'll check his digoxin level and potassium levels.   He will follow up with Dr. Mariana Single in 4 months. We'll see him in 8 months.

## 2013-05-25 LAB — DIGOXIN LEVEL: Digoxin Level: 0.7 ng/mL — ABNORMAL LOW (ref 0.8–2.0)

## 2013-06-08 ENCOUNTER — Ambulatory Visit (HOSPITAL_COMMUNITY): Payer: 59 | Attending: Cardiology | Admitting: Radiology

## 2013-06-08 DIAGNOSIS — I4891 Unspecified atrial fibrillation: Secondary | ICD-10-CM

## 2013-06-08 DIAGNOSIS — I509 Heart failure, unspecified: Secondary | ICD-10-CM

## 2013-06-08 DIAGNOSIS — I5022 Chronic systolic (congestive) heart failure: Secondary | ICD-10-CM

## 2013-06-08 DIAGNOSIS — I428 Other cardiomyopathies: Secondary | ICD-10-CM | POA: Insufficient documentation

## 2013-06-08 NOTE — Progress Notes (Signed)
Echocardiogram performed.  

## 2013-06-11 ENCOUNTER — Encounter: Payer: Self-pay | Admitting: Internal Medicine

## 2013-07-09 ENCOUNTER — Other Ambulatory Visit: Payer: Self-pay | Admitting: Dermatology

## 2013-07-25 ENCOUNTER — Other Ambulatory Visit: Payer: Self-pay | Admitting: Internal Medicine

## 2013-08-08 ENCOUNTER — Other Ambulatory Visit (INDEPENDENT_AMBULATORY_CARE_PROVIDER_SITE_OTHER): Payer: 59

## 2013-08-08 DIAGNOSIS — I10 Essential (primary) hypertension: Secondary | ICD-10-CM

## 2013-08-08 DIAGNOSIS — E119 Type 2 diabetes mellitus without complications: Secondary | ICD-10-CM

## 2013-08-08 LAB — LIPID PANEL
CHOLESTEROL: 167 mg/dL (ref 0–200)
HDL: 33.7 mg/dL — ABNORMAL LOW (ref >39.00)
LDL Cholesterol: 90 mg/dL (ref 0–99)
NONHDL: 133.3
Total CHOL/HDL Ratio: 5
Triglycerides: 217 mg/dL — ABNORMAL HIGH (ref 0.0–149.0)
VLDL: 43.4 mg/dL — ABNORMAL HIGH (ref 0.0–40.0)

## 2013-08-08 LAB — HEMOGLOBIN A1C: Hgb A1c MFr Bld: 10.5 % — ABNORMAL HIGH (ref 4.6–6.5)

## 2013-08-23 ENCOUNTER — Ambulatory Visit (INDEPENDENT_AMBULATORY_CARE_PROVIDER_SITE_OTHER): Payer: 59 | Admitting: *Deleted

## 2013-08-23 DIAGNOSIS — I428 Other cardiomyopathies: Secondary | ICD-10-CM

## 2013-08-23 DIAGNOSIS — I5022 Chronic systolic (congestive) heart failure: Secondary | ICD-10-CM

## 2013-09-03 LAB — MDC_IDC_ENUM_SESS_TYPE_REMOTE
Battery Remaining Longevity: 70 mo
Battery Remaining Percentage: 87 %
Battery Voltage: 3.05 V
Brady Statistic AP VP Percent: 1 %
Brady Statistic AP VS Percent: 1 %
Brady Statistic AS VP Percent: 99 %
Brady Statistic AS VS Percent: 1 %
Brady Statistic RA Percent Paced: 1 %
Date Time Interrogation Session: 20150730234607
HighPow Impedance: 75 Ohm
HighPow Impedance: 75 Ohm
Implantable Pulse Generator Serial Number: 7133975
Lead Channel Impedance Value: 450 Ohm
Lead Channel Impedance Value: 560 Ohm
Lead Channel Impedance Value: 910 Ohm
Lead Channel Pacing Threshold Amplitude: 0.5 V
Lead Channel Pacing Threshold Amplitude: 0.875 V
Lead Channel Pacing Threshold Amplitude: 1.875 V
Lead Channel Pacing Threshold Pulse Width: 0.5 ms
Lead Channel Pacing Threshold Pulse Width: 0.5 ms
Lead Channel Pacing Threshold Pulse Width: 0.5 ms
Lead Channel Sensing Intrinsic Amplitude: 11.8 mV
Lead Channel Sensing Intrinsic Amplitude: 5 mV
Lead Channel Setting Pacing Amplitude: 1.5 V
Lead Channel Setting Pacing Amplitude: 2 V
Lead Channel Setting Pacing Amplitude: 2.875
Lead Channel Setting Pacing Pulse Width: 0.5 ms
Lead Channel Setting Pacing Pulse Width: 0.5 ms
Lead Channel Setting Sensing Sensitivity: 0.5 mV
Zone Setting Detection Interval: 250 ms
Zone Setting Detection Interval: 280 ms
Zone Setting Detection Interval: 330 ms

## 2013-09-17 ENCOUNTER — Telehealth: Payer: Self-pay | Admitting: Cardiovascular Disease

## 2013-09-17 ENCOUNTER — Telehealth: Payer: Self-pay | Admitting: Internal Medicine

## 2013-09-17 ENCOUNTER — Ambulatory Visit (INDEPENDENT_AMBULATORY_CARE_PROVIDER_SITE_OTHER): Payer: 59 | Admitting: *Deleted

## 2013-09-17 DIAGNOSIS — I428 Other cardiomyopathies: Secondary | ICD-10-CM

## 2013-09-17 DIAGNOSIS — I5022 Chronic systolic (congestive) heart failure: Secondary | ICD-10-CM

## 2013-09-17 LAB — MDC_IDC_ENUM_SESS_TYPE_INCLINIC
Brady Statistic RA Percent Paced: 0.32 %
Brady Statistic RV Percent Paced: 99.57 %
Date Time Interrogation Session: 20150824161106
HIGH POWER IMPEDANCE MEASURED VALUE: 90 Ohm
Lead Channel Impedance Value: 587.5 Ohm
Lead Channel Pacing Threshold Amplitude: 0.5 V
Lead Channel Pacing Threshold Amplitude: 1 V
Lead Channel Pacing Threshold Pulse Width: 0.5 ms
Lead Channel Pacing Threshold Pulse Width: 0.5 ms
Lead Channel Pacing Threshold Pulse Width: 0.5 ms
Lead Channel Setting Pacing Amplitude: 3 V
Lead Channel Setting Pacing Pulse Width: 0.5 ms
Lead Channel Setting Pacing Pulse Width: 0.5 ms
MDC IDC MSMT BATTERY REMAINING LONGEVITY: 67.2 mo
MDC IDC MSMT LEADCHNL LV IMPEDANCE VALUE: 987.5 Ohm
MDC IDC MSMT LEADCHNL LV PACING THRESHOLD AMPLITUDE: 2 V
MDC IDC MSMT LEADCHNL RA IMPEDANCE VALUE: 475 Ohm
MDC IDC MSMT LEADCHNL RA SENSING INTR AMPL: 5 mV
MDC IDC MSMT LEADCHNL RV SENSING INTR AMPL: 11.8 mV
MDC IDC PG SERIAL: 7133975
MDC IDC SET LEADCHNL RA PACING AMPLITUDE: 1.5 V
MDC IDC SET LEADCHNL RV PACING AMPLITUDE: 2 V
MDC IDC SET LEADCHNL RV SENSING SENSITIVITY: 0.5 mV
MDC IDC SET ZONE DETECTION INTERVAL: 280 ms
MDC IDC SET ZONE DETECTION INTERVAL: 330 ms
Zone Setting Detection Interval: 250 ms

## 2013-09-17 NOTE — Telephone Encounter (Signed)
New problem   Pt want to come in office today b/c of pain near defib site. Please call pt.

## 2013-09-17 NOTE — Telephone Encounter (Signed)
Spoke with the device clinic staff , Eduard Clos  will call pt.

## 2013-09-17 NOTE — Progress Notes (Signed)
Pt's concerned lifting 80lbs caused pain below pocket. CRT-D device check in office. Thresholds and sensing consistent with previous device measurements. Lead impedance trends stable over time. 21 mode switch episodes--- <1%, longest 22min @185bpm . No ventricular arrhythmia episodes recorded. Patient bi-ventricularly pacing >99% of the time. Device programmed with appropriate safety margins. Heart failure diagnostics reviewed and trends are stable for patient. No changes made this session. Estimated longevity 5.5-6.58yrs. Follow up as planned. [place] 11/26/13 & ROV w/ Dr. Caryl Comes 2/16.

## 2013-09-17 NOTE — Telephone Encounter (Signed)
Pt's family member suspects a pulled muscle due to pt lifting 80lbs on Friday. Made appt w/ device clinic today to verify device functioning normally.

## 2013-09-17 NOTE — Telephone Encounter (Signed)
See previous phone message. 

## 2013-09-17 NOTE — Telephone Encounter (Signed)
New message     Pt states he has chest pain----not sure if it is from a pulled muscle or really heart problems. Per triage--msg took for a nurse

## 2013-09-17 NOTE — Telephone Encounter (Signed)
Pt's wife called regarding pt C/o of pain bellow the ICD implant. Wife states pt peak up something 80 lbs heavy last friday , and he has been C/o of pain since then. Pt has the ICD implant in January 2015. Pt would like to be seen today.

## 2013-09-18 ENCOUNTER — Encounter: Payer: Self-pay | Admitting: Cardiology

## 2013-09-20 ENCOUNTER — Encounter: Payer: Self-pay | Admitting: Cardiology

## 2013-09-27 ENCOUNTER — Encounter: Payer: Self-pay | Admitting: Internal Medicine

## 2013-11-16 ENCOUNTER — Other Ambulatory Visit: Payer: Self-pay | Admitting: Physician Assistant

## 2013-11-26 ENCOUNTER — Encounter: Payer: Self-pay | Admitting: Internal Medicine

## 2013-11-26 ENCOUNTER — Ambulatory Visit (INDEPENDENT_AMBULATORY_CARE_PROVIDER_SITE_OTHER): Payer: 59 | Admitting: *Deleted

## 2013-11-26 DIAGNOSIS — I5022 Chronic systolic (congestive) heart failure: Secondary | ICD-10-CM

## 2013-11-26 DIAGNOSIS — I428 Other cardiomyopathies: Secondary | ICD-10-CM

## 2013-11-26 DIAGNOSIS — I429 Cardiomyopathy, unspecified: Secondary | ICD-10-CM

## 2013-11-26 LAB — MDC_IDC_ENUM_SESS_TYPE_REMOTE
Battery Remaining Longevity: 67 mo
Battery Remaining Percentage: 83 %
Battery Voltage: 2.99 V
Brady Statistic AP VP Percent: 1 %
Brady Statistic AP VS Percent: 1 %
Brady Statistic AS VP Percent: 99 %
Brady Statistic RA Percent Paced: 1 %
Date Time Interrogation Session: 20151102142142
HIGH POWER IMPEDANCE MEASURED VALUE: 90 Ohm
HIGH POWER IMPEDANCE MEASURED VALUE: 90 Ohm
Lead Channel Impedance Value: 1025 Ohm
Lead Channel Impedance Value: 450 Ohm
Lead Channel Impedance Value: 530 Ohm
Lead Channel Pacing Threshold Amplitude: 0.625 V
Lead Channel Pacing Threshold Amplitude: 1 V
Lead Channel Pacing Threshold Amplitude: 2 V
Lead Channel Pacing Threshold Pulse Width: 0.5 ms
Lead Channel Pacing Threshold Pulse Width: 0.5 ms
Lead Channel Sensing Intrinsic Amplitude: 11.8 mV
Lead Channel Sensing Intrinsic Amplitude: 5 mV
Lead Channel Setting Pacing Amplitude: 1.625
Lead Channel Setting Pacing Amplitude: 2 V
Lead Channel Setting Pacing Amplitude: 3 V
MDC IDC MSMT LEADCHNL LV PACING THRESHOLD PULSEWIDTH: 0.5 ms
MDC IDC PG SERIAL: 7133975
MDC IDC SET LEADCHNL LV PACING PULSEWIDTH: 0.5 ms
MDC IDC SET LEADCHNL RV PACING PULSEWIDTH: 0.5 ms
MDC IDC SET LEADCHNL RV SENSING SENSITIVITY: 0.5 mV
MDC IDC SET ZONE DETECTION INTERVAL: 250 ms
MDC IDC STAT BRADY AS VS PERCENT: 1 %
Zone Setting Detection Interval: 280 ms
Zone Setting Detection Interval: 330 ms

## 2013-11-26 NOTE — Progress Notes (Signed)
Remote ICD transmission.   

## 2013-12-03 ENCOUNTER — Encounter: Payer: Self-pay | Admitting: Internal Medicine

## 2013-12-12 ENCOUNTER — Other Ambulatory Visit: Payer: Self-pay | Admitting: Physician Assistant

## 2013-12-21 ENCOUNTER — Other Ambulatory Visit: Payer: Self-pay | Admitting: Internal Medicine

## 2014-01-01 ENCOUNTER — Other Ambulatory Visit: Payer: Self-pay | Admitting: Physician Assistant

## 2014-01-03 ENCOUNTER — Encounter (HOSPITAL_COMMUNITY): Payer: Self-pay | Admitting: Cardiology

## 2014-01-08 ENCOUNTER — Other Ambulatory Visit: Payer: Self-pay | Admitting: Internal Medicine

## 2014-01-15 ENCOUNTER — Encounter: Payer: Self-pay | Admitting: Internal Medicine

## 2014-01-31 ENCOUNTER — Encounter: Payer: Self-pay | Admitting: Cardiology

## 2014-02-14 ENCOUNTER — Encounter: Payer: Self-pay | Admitting: Cardiology

## 2014-03-07 ENCOUNTER — Other Ambulatory Visit: Payer: Self-pay

## 2014-03-07 ENCOUNTER — Other Ambulatory Visit: Payer: Self-pay | Admitting: Internal Medicine

## 2014-03-07 MED ORDER — CARVEDILOL 25 MG PO TABS: ORAL_TABLET | ORAL | Status: DC

## 2014-03-31 ENCOUNTER — Other Ambulatory Visit: Payer: Self-pay | Admitting: Cardiovascular Disease

## 2014-03-31 ENCOUNTER — Other Ambulatory Visit: Payer: Self-pay | Admitting: Internal Medicine

## 2014-04-18 ENCOUNTER — Encounter (HOSPITAL_COMMUNITY): Payer: Self-pay

## 2014-04-18 ENCOUNTER — Inpatient Hospital Stay (HOSPITAL_COMMUNITY)
Admission: EM | Admit: 2014-04-18 | Discharge: 2014-04-22 | DRG: 309 | Disposition: A | Discharge status: Home / Self Care | Payer: 59 | Attending: Internal Medicine | Admitting: Internal Medicine

## 2014-04-18 ENCOUNTER — Emergency Department (HOSPITAL_COMMUNITY): Payer: 59

## 2014-04-18 DIAGNOSIS — G4730 Sleep apnea, unspecified: Secondary | ICD-10-CM | POA: Diagnosis present

## 2014-04-18 DIAGNOSIS — I10 Essential (primary) hypertension: Secondary | ICD-10-CM | POA: Diagnosis present

## 2014-04-18 DIAGNOSIS — E876 Hypokalemia: Secondary | ICD-10-CM

## 2014-04-18 DIAGNOSIS — Z6833 Body mass index (BMI) 33.0-33.9, adult: Secondary | ICD-10-CM

## 2014-04-18 DIAGNOSIS — G4733 Obstructive sleep apnea (adult) (pediatric): Secondary | ICD-10-CM | POA: Diagnosis present

## 2014-04-18 DIAGNOSIS — Z8249 Family history of ischemic heart disease and other diseases of the circulatory system: Secondary | ICD-10-CM

## 2014-04-18 DIAGNOSIS — I4891 Unspecified atrial fibrillation: Secondary | ICD-10-CM | POA: Diagnosis present

## 2014-04-18 DIAGNOSIS — K219 Gastro-esophageal reflux disease without esophagitis: Secondary | ICD-10-CM | POA: Diagnosis present

## 2014-04-18 DIAGNOSIS — I447 Left bundle-branch block, unspecified: Secondary | ICD-10-CM | POA: Diagnosis present

## 2014-04-18 DIAGNOSIS — Z7982 Long term (current) use of aspirin: Secondary | ICD-10-CM

## 2014-04-18 DIAGNOSIS — K760 Fatty (change of) liver, not elsewhere classified: Secondary | ICD-10-CM | POA: Diagnosis present

## 2014-04-18 DIAGNOSIS — E1165 Type 2 diabetes mellitus with hyperglycemia: Secondary | ICD-10-CM | POA: Diagnosis present

## 2014-04-18 DIAGNOSIS — T82190A Other mechanical complication of cardiac electrode, initial encounter: Secondary | ICD-10-CM | POA: Diagnosis present

## 2014-04-18 DIAGNOSIS — F411 Generalized anxiety disorder: Secondary | ICD-10-CM | POA: Diagnosis present

## 2014-04-18 DIAGNOSIS — I480 Paroxysmal atrial fibrillation: Principal | ICD-10-CM | POA: Diagnosis present

## 2014-04-18 DIAGNOSIS — I428 Other cardiomyopathies: Secondary | ICD-10-CM

## 2014-04-18 DIAGNOSIS — M19049 Primary osteoarthritis, unspecified hand: Secondary | ICD-10-CM | POA: Diagnosis present

## 2014-04-18 DIAGNOSIS — H9190 Unspecified hearing loss, unspecified ear: Secondary | ICD-10-CM | POA: Diagnosis present

## 2014-04-18 DIAGNOSIS — I472 Ventricular tachycardia: Secondary | ICD-10-CM | POA: Diagnosis present

## 2014-04-18 DIAGNOSIS — Z79899 Other long term (current) drug therapy: Secondary | ICD-10-CM | POA: Diagnosis not present

## 2014-04-18 DIAGNOSIS — E669 Obesity, unspecified: Secondary | ICD-10-CM | POA: Diagnosis present

## 2014-04-18 DIAGNOSIS — Z0389 Encounter for observation for other suspected diseases and conditions ruled out: Secondary | ICD-10-CM | POA: Diagnosis not present

## 2014-04-18 DIAGNOSIS — Z86010 Personal history of colonic polyps: Secondary | ICD-10-CM | POA: Diagnosis not present

## 2014-04-18 DIAGNOSIS — I5022 Chronic systolic (congestive) heart failure: Secondary | ICD-10-CM | POA: Diagnosis present

## 2014-04-18 DIAGNOSIS — Z833 Family history of diabetes mellitus: Secondary | ICD-10-CM | POA: Diagnosis not present

## 2014-04-18 DIAGNOSIS — Z87442 Personal history of urinary calculi: Secondary | ICD-10-CM | POA: Diagnosis not present

## 2014-04-18 DIAGNOSIS — I429 Cardiomyopathy, unspecified: Secondary | ICD-10-CM | POA: Diagnosis not present

## 2014-04-18 DIAGNOSIS — Z4502 Encounter for adjustment and management of automatic implantable cardiac defibrillator: Secondary | ICD-10-CM

## 2014-04-18 HISTORY — DX: Chronic systolic (congestive) heart failure: I50.22

## 2014-04-18 HISTORY — DX: Paroxysmal atrial fibrillation: I48.0

## 2014-04-18 LAB — CBC WITH DIFFERENTIAL/PLATELET
BASOS PCT: 0 % (ref 0–1)
Basophils Absolute: 0 10*3/uL (ref 0.0–0.1)
EOS ABS: 0.2 10*3/uL (ref 0.0–0.7)
Eosinophils Relative: 2 % (ref 0–5)
HEMATOCRIT: 45.6 % (ref 39.0–52.0)
Hemoglobin: 15.7 g/dL (ref 13.0–17.0)
LYMPHS ABS: 1.8 10*3/uL (ref 0.7–4.0)
LYMPHS PCT: 26 % (ref 12–46)
MCH: 29.1 pg (ref 26.0–34.0)
MCHC: 34.4 g/dL (ref 30.0–36.0)
MCV: 84.6 fL (ref 78.0–100.0)
MONO ABS: 0.5 10*3/uL (ref 0.1–1.0)
MONOS PCT: 7 % (ref 3–12)
Neutro Abs: 4.6 10*3/uL (ref 1.7–7.7)
Neutrophils Relative %: 65 % (ref 43–77)
Platelets: 176 10*3/uL (ref 150–400)
RBC: 5.39 MIL/uL (ref 4.22–5.81)
RDW: 13.8 % (ref 11.5–15.5)
WBC: 7.1 10*3/uL (ref 4.0–10.5)

## 2014-04-18 LAB — BASIC METABOLIC PANEL
ANION GAP: 8 (ref 5–15)
BUN: 21 mg/dL (ref 6–23)
CHLORIDE: 102 mmol/L (ref 96–112)
CO2: 28 mmol/L (ref 19–32)
Calcium: 9.5 mg/dL (ref 8.4–10.5)
Creatinine, Ser: 0.87 mg/dL (ref 0.50–1.35)
GFR calc Af Amer: >90 mL/min (ref >90)
GFR calc non Af Amer: >90 mL/min (ref >90)
GLUCOSE: 386 mg/dL — AB (ref 70–99)
POTASSIUM: 3.7 mmol/L (ref 3.5–5.1)
SODIUM: 138 mmol/L (ref 135–145)

## 2014-04-18 LAB — MAGNESIUM: MAGNESIUM: 1.9 mg/dL (ref 1.5–2.5)

## 2014-04-18 LAB — DIGOXIN LEVEL: Digoxin Level: 0.2 ng/mL — ABNORMAL LOW (ref 0.8–2.0)

## 2014-04-18 LAB — TROPONIN I: Troponin I: 0.04 ng/mL — ABNORMAL HIGH (ref <0.031)

## 2014-04-18 MED ORDER — METOPROLOL TARTRATE 1 MG/ML IV SOLN
5.0000 mg | Freq: Once | INTRAVENOUS | Status: AC
  Administered 2014-04-18: 5 mg via INTRAVENOUS
  Filled 2014-04-18: qty 5

## 2014-04-18 NOTE — ED Provider Notes (Signed)
CSN: [id number]     Arrival date & time 04/18/14  2056 History   First MD Initiated Contact with Patient 04/18/14 2102     Chief Complaint  Patient presents with  . Pacemaker Problem     (Consider location/radiation/quality/duration/timing/severity/associated sxs/prior Treatment) HPI  54 year old male with a history of A. fib and CHF with a pacemaker/defibrillator presents after his defibrillator fired 3 times while at work. This occurred shortly before arrival. Patient states he was staying late at work and also felt short of breath and then has to [employer] fired. He denies any chest pain except the site of his defibrillator. Has not felt any different or ill prior to coming into the hospital. Does not feel short of breath currently. No dizziness.  Past Medical History  Diagnosis Date  . Hypertension   . DJD (degenerative joint disease)   . Dermatophytosis of the body   . Esophageal reflux   . Other chronic nonalcoholic liver disease     fatty liver  . Unspecified hearing loss   . Anxiety state, unspecified   . Non-ischemic cardiomyopathy     a.  cath 6/11: OM2 30%; EF 30%;  b. echo 2/12: EF 30%, mild MR, mild LAE;  c.  Echo (11/14):  Mod LVH, EF 25-30%, diff HK, mod LAE.   [name] Atrial fibrillation     a. s/p DCCV 6/11; previously on Pradaxa;  b. Event Monitor 2012->No PAF;  c. 09/2011 s/p DCCV ->Xarelto started.  [name] LBBB (left bundle branch block)     intermittent  . Hx of colonic polyps   . Osteoarthrosis, unspecified whether generalized or localized, hand   . Diarrhea   . Gastroenteritis   . Abdominal pain, right upper quadrant   . Folliculitis   . Automatic implantable cardioverter-defibrillator in situ   . CHF (congestive heart failure)   . OSA on CPAP   . Type II diabetes mellitus   . Calculus of kidney 1981    "passed it on my own"   Past Surgical History  Procedure Laterality Date  . Neck surgery      plate/fusion  . Bilateral knee arthroscopy    . Cardioversion   06/2009    Non ischemic cardiomyopathy - cath and echo  . Stress cardiolite  12/02/1998    Normal EF 52%  . Stress cardiolite  08/2005    Normal, EF 42%  . Cardiovascular stress test  2006    Neg per pt  . Upper gastrointestinal endoscopy  08/2006    GERD  . Abdominal US  01/2007    Fatty liver, no gallstones  . Doppler echocardiography  11/2009    Decreased EF of 35-40%  . Appendectomy    . Cardioversion  09/29/2011    Procedure: CARDIOVERSION;  Surgeon: Thayer Headings, MD;  Location: [facility];  Service: Cardiovascular;  Laterality: [name];  . Bi-ventricular implantable cardioverter defibrillator  (crt-d)  02/14/2013  . Left heart catheterization with coronary angiogram [name] 08/18/2011    Procedure: LEFT HEART CATHETERIZATION WITH CORONARY ANGIOGRAM;  Surgeon: Peter M Martinique, MD;  Location: [facility] CATH LAB;  Service: Cardiovascular;  Laterality: [name];  . Bi-ventricular implantable cardioverter defibrillator [name] 02/14/2013    Procedure: BI-VENTRICULAR IMPLANTABLE CARDIOVERTER DEFIBRILLATOR  (CRT-D);  Surgeon: Deboraha Sprang, MD;  Location: [facility] CATH LAB;  Service: Cardiovascular;  Laterality: [name];   Family History  Problem Relation Age of Onset  . Hypertension Mother   . Diabetes Mother   . Hypertension Father   .  Diabetes Father   . Diabetes Brother   . Kidney disease Brother   . Diabetes Brother   . Heart failure Mother     Died @ 76  . Heart failure Father     Died @ 54  . Other Sister     4 sisters A&W   History  Substance Use Topics  . Smoking status: Never Smoker   . Smokeless tobacco: Former Systems developer    Types: Snuff     Comment: 02/14/2013 "quit snuff in 1006"  . Alcohol Use: No    Review of Systems  Constitutional: Negative for fever.  Respiratory: Positive for shortness of breath.   Cardiovascular: Positive for chest pain.  Gastrointestinal: Negative for nausea, vomiting, abdominal pain and diarrhea.  Neurological: Negative for syncope and headaches.  All other systems reviewed  and are negative.     Allergies  Review of patient's allergies indicates no known allergies.  Home Medications   Prior to Admission medications   Medication Sig Start Date End Date Taking? Authorizing Provider  aspirin EC 81 MG tablet Take 1 tablet (81 mg total) by mouth daily. 04/25/13   Deboraha Sprang, MD  carvedilol (COREG) 25 MG tablet TAKE 1 TABLET BY MOUTH 2 TIMES DAILY WITH A MEAL. 04/01/14   [name], MD  digoxin (LANOXIN) 0.25 MG tablet TAKE 1 TABLET (250 MCG TOTAL) BY MOUTH DAILY. 04/01/14   [name], MD  glucose blood (ONE TOUCH ULTRA TEST) test strip Check glucose twice daily and as needed, Dx. 250.00 uncontrolled DM 02/06/13   Abner Greenspan, MD  losartan-hydrochlorothiazide (HYZAAR) 50-12.5 MG per tablet TAKE 1 TABLET BY MOUTH DAILY. 11/16/13   [name], MD  Multiple Vitamin (MULTIVITAMIN) tablet Take 1 tablet by mouth daily.      Historical Provider, MD  Omeprazole-Sodium Bicarbonate (ZEGERID OTC) 20-1100 MG CAPS Take 1 capsule by mouth daily.     Historical Provider, MD  spironolactone (ALDACTONE) 25 MG tablet TAKE 1 TABLET (25 MG TOTAL) BY MOUTH DAILY. 12/24/13   Deboraha Sprang, MD   BP 126/86 mmHg  Pulse 112  Temp(Src) 97.7 F (36.5 C) (Oral)  Resp 14  Ht 6\' 4"  (1.93 m)  SpO2 95% Physical Exam  Constitutional: He is oriented to person, place, and time. He appears well-developed and well-nourished.  HENT:  Head: Normocephalic and atraumatic.  Right Ear: External ear normal.  Left Ear: External ear normal.  Nose: Nose normal.  Eyes: Right eye exhibits no discharge. Left eye exhibits no discharge.  Neck: Neck supple.  Cardiovascular: Normal rate, regular rhythm, normal heart sounds and intact distal pulses.   Pulmonary/Chest: Effort normal. He exhibits tenderness (over defibrillator/pacemaker site - no swelling or redness).  Abdominal: Soft. He exhibits no distension. There is no tenderness.  Musculoskeletal: He exhibits no edema.  Neurological: He is  alert and oriented to person, place, and time.  Skin: Skin is warm and dry. He is not diaphoretic.  Nursing note and vitals reviewed.   ED Course  Procedures (including critical care time) Labs Review Labs Reviewed  BASIC METABOLIC PANEL - Abnormal; Notable for the following:    Glucose, Bld 386 (*)    All other components within normal limits  TROPONIN I - Abnormal; Notable for the following:    Troponin I 0.04 (*)    All other components within normal limits  DIGOXIN LEVEL - Abnormal; Notable for the following:    Digoxin Level 0.2 (*)    All  other components within normal limits  CBC WITH DIFFERENTIAL/PLATELET  MAGNESIUM    Imaging Review Dg Chest Port 1 View  04/18/2014   CLINICAL DATA:  Felt defibrillator fire three times today. Initial encounter.  EXAM: PORTABLE CHEST - 1 VIEW  COMPARISON:  Chest radiograph performed 02/15/2013  FINDINGS: The lungs are well-aerated. Mild bibasilar atelectasis or scarring is noted. There is no evidence of pleural effusion or pneumothorax.  The cardiomediastinal silhouette is normal in size. A pacemaker/AICD is noted overlying the left chest wall, with leads ending overlying the right atrium, right ventricle and coronary sinus. No acute osseous abnormalities are seen. Cervical spinal fusion hardware is noted.  IMPRESSION: Mild bibasilar atelectasis or scarring noted. Lungs otherwise clear.   Electronically Signed   By: Garald Balding M.D.   On: 04/18/2014 21:47     EKG Interpretation   Date/Time:  Thursday April 18 2014 21:02:51 EDT Ventricular Rate:  111 PR Interval:  112 QRS Duration: 108 QT Interval:  351 QTC Calculation: 477 R Axis:   -91 Text Interpretation:  Sinus tachycardia LAD, consider LAFB or inferior  infarct Low voltage, extremity leads Probable right ventricular  hypertrophy Lateral infarct, recent Baseline wander in lead(s) II  Confirmed by Stepahnie Campo  MD, Americus Scheurich ([id number]) on 04/18/2014 9:19:40 PM      MDM   Final diagnoses:   Atrial fibrillation with RVR    Patient's pacemaker interrogation reveals the patient had H or fibrillation with RVR. According to the interrogation is atrial heart rate was going at 300 bpm his ventricular rate was 240. He was shocked 4 times. Here he has been tachycardic but it appears to be sinus with a paced rhythm. Cardiology consult, Dr. Philbert Riser is seen patient would like to treat with 5 mg IV metoprolol and admit patient to step down for closer monitoring and medication adjustment.    Sherwood Gambler, MD 04/18/14 [phone]

## 2014-04-18 NOTE — ED Notes (Signed)
Report given to floor

## 2014-04-18 NOTE — ED Notes (Addendum)
[employer] Pacemaker called with report - 4 shocks for A Fib w/ RVR (300bpm atrial, 240bpm Vent); shocks did convert out of A Fib.

## 2014-04-18 NOTE — ED Notes (Signed)
Per EMS - pt working on [employer] site, felt defibrillator fire 3 times - 3rd time brought him to his knees. Pt has defib for 20% function, hx of A.Fib. Defibrillator ventricular paced. Initial hr 110-120bpm, bp 150/90; last hr 90-100, bp 138/86. Takes daily hr meds. Pt denies pain, PVC on monitor - 2 L O2, sat 100%. 20G Left AC.

## 2014-04-18 NOTE — ED Notes (Signed)
Attempt to call report to floor x1. 

## 2014-04-19 DIAGNOSIS — I480 Paroxysmal atrial fibrillation: Principal | ICD-10-CM

## 2014-04-19 DIAGNOSIS — Z0389 Encounter for observation for other suspected diseases and conditions ruled out: Secondary | ICD-10-CM

## 2014-04-19 DIAGNOSIS — I4891 Unspecified atrial fibrillation: Secondary | ICD-10-CM

## 2014-04-19 DIAGNOSIS — I429 Cardiomyopathy, unspecified: Secondary | ICD-10-CM

## 2014-04-19 DIAGNOSIS — Z4502 Encounter for adjustment and management of automatic implantable cardiac defibrillator: Secondary | ICD-10-CM

## 2014-04-19 LAB — CBC
HCT: 43.6 % (ref 39.0–52.0)
HCT: 44.2 % (ref 39.0–52.0)
HEMOGLOBIN: 15.2 g/dL (ref 13.0–17.0)
Hemoglobin: 15.3 g/dL (ref 13.0–17.0)
MCH: 29.6 pg (ref 26.0–34.0)
MCH: 29.3 pg (ref 26.0–34.0)
MCHC: 34.9 g/dL (ref 30.0–36.0)
MCHC: 34.6 g/dL (ref 30.0–36.0)
MCV: 85 fL (ref 78.0–100.0)
MCV: 84.5 fL (ref 78.0–100.0)
Platelets: 164 10*3/uL (ref 150–400)
Platelets: 150 10*3/uL (ref 150–400)
RBC: 5.13 MIL/uL (ref 4.22–5.81)
RBC: 5.23 MIL/uL (ref 4.22–5.81)
RDW: 13.8 % (ref 11.5–15.5)
RDW: 13.7 % (ref 11.5–15.5)
WBC: 7.5 10*3/uL (ref 4.0–10.5)
WBC: 7.2 10*3/uL (ref 4.0–10.5)

## 2014-04-19 LAB — BASIC METABOLIC PANEL
ANION GAP: 8 (ref 5–15)
ANION GAP: 6 (ref 5–15)
Anion gap: 9 (ref 5–15)
BUN: 18 mg/dL (ref 6–23)
BUN: 19 mg/dL (ref 6–23)
BUN: 19 mg/dL (ref 6–23)
CALCIUM: 8.8 mg/dL (ref 8.4–10.5)
CHLORIDE: 100 mmol/L (ref 96–112)
CHLORIDE: 100 mmol/L (ref 96–112)
CO2: 27 mmol/L (ref 19–32)
CO2: 27 mmol/L (ref 19–32)
CO2: 23 mmol/L (ref 19–32)
CREATININE: 0.67 mg/dL (ref 0.50–1.35)
Calcium: 8.9 mg/dL (ref 8.4–10.5)
Calcium: 9 mg/dL (ref 8.4–10.5)
Chloride: 101 mmol/L (ref 96–112)
Creatinine, Ser: 0.62 mg/dL (ref 0.50–1.35)
Creatinine, Ser: 0.61 mg/dL (ref 0.50–1.35)
GFR calc Af Amer: >90 mL/min (ref >90)
GFR calc Af Amer: >90 mL/min (ref >90)
GFR calc non Af Amer: >90 mL/min (ref >90)
GFR calc non Af Amer: >90 mL/min (ref >90)
GFR calc non Af Amer: >90 mL/min (ref >90)
Glucose, Bld: 230 mg/dL — ABNORMAL HIGH (ref 70–99)
Glucose, Bld: 242 mg/dL — ABNORMAL HIGH (ref 70–99)
Glucose, Bld: 237 mg/dL — ABNORMAL HIGH (ref 70–99)
POTASSIUM: 3.6 mmol/L (ref 3.5–5.1)
Potassium: 3.8 mmol/L (ref 3.5–5.1)
Potassium: 5 mmol/L (ref 3.5–5.1)
SODIUM: 134 mmol/L — AB (ref 135–145)
Sodium: 135 mmol/L (ref 135–145)
Sodium: 132 mmol/L — ABNORMAL LOW (ref 135–145)

## 2014-04-19 LAB — GLUCOSE, CAPILLARY
GLUCOSE-CAPILLARY: 213 mg/dL — AB (ref 70–99)
Glucose-Capillary: 241 mg/dL — ABNORMAL HIGH (ref 70–99)
Glucose-Capillary: 189 mg/dL — ABNORMAL HIGH (ref 70–99)

## 2014-04-19 LAB — CREATININE, SERUM
CREATININE: 0.63 mg/dL (ref 0.50–1.35)
GFR calc Af Amer: >90 mL/min (ref >90)
GFR calc non Af Amer: >90 mL/min (ref >90)

## 2014-04-19 LAB — TROPONIN I
Troponin I: 0.22 ng/mL — ABNORMAL HIGH (ref <0.031)
Troponin I: 0.14 ng/mL — ABNORMAL HIGH (ref <0.031)
Troponin I: 0.1 ng/mL — ABNORMAL HIGH (ref <0.031)

## 2014-04-19 LAB — MRSA PCR SCREENING: MRSA by PCR: NEGATIVE

## 2014-04-19 LAB — TSH: TSH: 1.227 u[IU]/mL (ref 0.350–4.500)

## 2014-04-19 MED ORDER — CARVEDILOL 25 MG PO TABS
25.0000 mg | ORAL_TABLET | Freq: Two times a day (BID) | ORAL | Status: DC
  Administered 2014-04-19: 25 mg via ORAL
  Filled 2014-04-19: qty 1

## 2014-04-19 MED ORDER — ONDANSETRON HCL 4 MG/2ML IJ SOLN: 4.0000 mg | Freq: Four times a day (QID) | INTRAMUSCULAR | Status: DC | PRN

## 2014-04-19 MED ORDER — ASPIRIN EC 81 MG PO TBEC
81.0000 mg | DELAYED_RELEASE_TABLET | Freq: Every day | ORAL | Status: DC
  Administered 2014-04-19 – 2014-04-22 (×4): 81 mg via ORAL
  Filled 2014-04-19 (×4): qty 1

## 2014-04-19 MED ORDER — LOSARTAN POTASSIUM-HCTZ 50-12.5 MG PO TABS: 1.0000 | ORAL_TABLET | Freq: Every day | ORAL | Status: DC

## 2014-04-19 MED ORDER — SODIUM CHLORIDE 0.9 % IV SOLN: 250.0000 mL | INTRAVENOUS | Status: DC | PRN

## 2014-04-19 MED ORDER — POTASSIUM CHLORIDE ER 10 MEQ PO TBCR
40.0000 meq | EXTENDED_RELEASE_TABLET | Freq: Once | ORAL | Status: AC
  Administered 2014-04-19: 40 meq via ORAL
  Filled 2014-04-19 (×2): qty 4

## 2014-04-19 MED ORDER — SODIUM CHLORIDE 0.9 % IJ SOLN
3.0000 mL | Freq: Two times a day (BID) | INTRAMUSCULAR | Status: DC
  Administered 2014-04-19 – 2014-04-22 (×7): 3 mL via INTRAVENOUS

## 2014-04-19 MED ORDER — PANTOPRAZOLE SODIUM 40 MG PO TBEC
40.0000 mg | DELAYED_RELEASE_TABLET | Freq: Every day | ORAL | Status: DC
  Administered 2014-04-19 – 2014-04-22 (×4): 40 mg via ORAL
  Filled 2014-04-19 (×4): qty 1

## 2014-04-19 MED ORDER — INSULIN ASPART 100 UNIT/ML ~~LOC~~ SOLN
0.0000 [IU] | Freq: Three times a day (TID) | SUBCUTANEOUS | Status: DC
Start: 2014-04-19 — End: 2014-04-22
  Administered 2014-04-19: 2 [IU] via SUBCUTANEOUS
  Administered 2014-04-19 – 2014-04-22 (×9): 3 [IU] via SUBCUTANEOUS

## 2014-04-19 MED ORDER — NITROGLYCERIN 0.4 MG SL SUBL: 0.4000 mg | SUBLINGUAL_TABLET | SUBLINGUAL | Status: DC | PRN

## 2014-04-19 MED ORDER — DIGOXIN 250 MCG PO TABS: 0.2500 mg | ORAL_TABLET | Freq: Every day | ORAL | Status: DC

## 2014-04-19 MED ORDER — DOFETILIDE 500 MCG PO CAPS
500.0000 ug | ORAL_CAPSULE | Freq: Two times a day (BID) | ORAL | Status: DC
Start: 2014-04-19 — End: 2014-04-22
  Administered 2014-04-19 – 2014-04-22 (×6): 500 ug via ORAL
  Filled 2014-04-19 (×6): qty 1

## 2014-04-19 MED ORDER — HYDROCHLOROTHIAZIDE 12.5 MG PO CAPS: 12.5000 mg | ORAL_CAPSULE | Freq: Every day | ORAL | Status: DC

## 2014-04-19 MED ORDER — INSULIN ASPART 100 UNIT/ML ~~LOC~~ SOLN
0.0000 [IU] | Freq: Every day | SUBCUTANEOUS | Status: DC
Start: 2014-04-19 — End: 2014-04-22
  Administered 2014-04-19 – 2014-04-20 (×2): 2 [IU] via SUBCUTANEOUS

## 2014-04-19 MED ORDER — ENOXAPARIN SODIUM 60 MG/0.6ML ~~LOC~~ SOLN
60.0000 mg | SUBCUTANEOUS | Status: DC
  Administered 2014-04-19 – 2014-04-22 (×4): 60 mg via SUBCUTANEOUS
  Filled 2014-04-19 (×4): qty 0.6

## 2014-04-19 MED ORDER — ACETAMINOPHEN 325 MG PO TABS: 650.0000 mg | ORAL_TABLET | ORAL | Status: DC | PRN

## 2014-04-19 MED ORDER — SODIUM CHLORIDE 0.9 % IJ SOLN: 3.0000 mL | INTRAMUSCULAR | Status: DC | PRN

## 2014-04-19 MED ORDER — CARVEDILOL 25 MG PO TABS
37.5000 mg | ORAL_TABLET | Freq: Two times a day (BID) | ORAL | Status: DC
  Administered 2014-04-19 – 2014-04-22 (×6): 37.5 mg via ORAL
  Filled 2014-04-19 (×12): qty 1

## 2014-04-19 MED ORDER — SPIRONOLACTONE 25 MG PO TABS
25.0000 mg | ORAL_TABLET | Freq: Every day | ORAL | Status: DC
  Administered 2014-04-19 – 2014-04-22 (×4): 25 mg via ORAL
  Filled 2014-04-19 (×4): qty 1

## 2014-04-19 MED ORDER — LOSARTAN POTASSIUM 50 MG PO TABS
50.0000 mg | ORAL_TABLET | Freq: Every day | ORAL | Status: DC
  Administered 2014-04-19 – 2014-04-22 (×4): 50 mg via ORAL
  Filled 2014-04-19 (×4): qty 1

## 2014-04-19 MED ORDER — POTASSIUM CHLORIDE CRYS ER 20 MEQ PO TBCR
40.0000 meq | EXTENDED_RELEASE_TABLET | Freq: Once | ORAL | Status: AC
  Administered 2014-04-19: 40 meq via ORAL
  Filled 2014-04-19: qty 2

## 2014-04-19 NOTE — Care Management Note (Signed)
    Page 1 of 1   04/22/2014     3:38:09 PM CARE MANAGEMENT NOTE 04/22/2014  Patient:  Aaron Thompson, Aaron Thompson   Account Number:  [id number]  Date Initiated:  04/19/2014  Documentation initiated by:  GRAVES-BIGELOW,BRENDA  Subjective/Objective Assessment:   Pt admitted for AFib RVR- Plan is to initiate Tikosyn. pt uses [facility]. CM did call to make sure Tikosyn is available and it can be dropped shipped. Benefits check in process.     Action/Plan:   Pt will be given a 7 day supply of Tikosyn via [place]. CM to assist with this. Cardiology Office may assist with the co pay card to be given to pt for co pay to be no more than $10.00   Anticipated DC Date:  04/22/2014   Anticipated DC Plan:  [place]  CM consult  Medication Assistance      Choice offered to / List presented to:             Status of service:  Completed, signed off Medicare Important Message given?  NO (If response is "NO", the following Medicare IM given date fields will be blank) Date Medicare IM given:   Medicare IM given by:   Date Additional Medicare IM given:   Additional Medicare IM given by:    Discharge Disposition:  HOME/SELF CARE  Per UR Regulation:  Reviewed for med. necessity/level of care/duration of stay  If discussed at [place] of Stay Meetings, dates discussed:    Comments:  3/28  1536 debbie Pama Roskos rn,bsn spoke w pt. and took 1 wk supply of tikosyn prescription to [facility]. they will call nse ceaser when filled. gave pt 30day free and copay w no copay for xarelto to pt also.  [place], RN, BSN [phone] S/W LAQUESHA @ [place] RX # [phone] TIKOSYN 500 MCG, 250 or 125 MCG  BID COVER- YES CO-PAY- $ 25.00  FOR 31 DAY SUPPLY TIER- 2 DRUG PRIOR APPROVAL -NO PHARMACY - [place], [place]

## 2014-04-19 NOTE — Discharge Instructions (Signed)
Stroke Prevention Some medical conditions and behaviors are associated with an increased chance of having a stroke. You may prevent a stroke by making healthy choices and managing medical conditions. HOW CAN I REDUCE MY RISK OF HAVING A STROKE?   Stay physically active. Get at least 30 minutes of activity on most or all days.  Do not smoke. It may also be helpful to avoid exposure to secondhand smoke.  Limit alcohol use. Moderate alcohol use is considered to be:  No more than 2 drinks per day for men.  No more than 1 drink per day for nonpregnant women.  Eat healthy foods. This involves:  Eating 5 or more servings of fruits and vegetables a day.  Making dietary changes that address high blood pressure (hypertension), high cholesterol, diabetes, or obesity.  Manage your cholesterol levels.  Making food choices that are high in fiber and low in saturated fat, trans fat, and cholesterol may control cholesterol levels.  Take any prescribed medicines to control cholesterol as directed by your health care provider.  Manage your diabetes.  Controlling your carbohydrate and sugar intake is recommended to manage diabetes.  Take any prescribed medicines to control diabetes as directed by your health care provider.  Control your hypertension.  Making food choices that are low in salt (sodium), saturated fat, trans fat, and cholesterol is recommended to manage hypertension.  Take any prescribed medicines to control hypertension as directed by your health care provider.  Maintain a healthy weight.  Reducing calorie intake and making food choices that are low in sodium, saturated fat, trans fat, and cholesterol are recommended to manage weight.  Stop drug abuse.  Avoid taking birth control pills.  Talk to your health care provider about the risks of taking birth control pills if you are over 7 years old, smoke, get migraines, or have ever had a blood clot.  Get evaluated for sleep  disorders (sleep apnea).  Talk to your health care provider about getting a sleep evaluation if you snore a lot or have excessive sleepiness.  Take medicines only as directed by your health care provider.  For some people, aspirin or blood thinners (anticoagulants) are helpful in reducing the risk of forming abnormal blood clots that can lead to stroke. If you have the irregular heart rhythm of atrial fibrillation, you should be on a blood thinner unless there is a good reason you cannot take them.  Understand all your medicine instructions.  Make sure that other conditions (such as anemia or atherosclerosis) are addressed. SEEK IMMEDIATE MEDICAL CARE IF:   You have sudden weakness or numbness of the face, arm, or leg, especially on one side of the body.  Your face or eyelid droops to one side.  You have sudden confusion.  You have trouble speaking (aphasia) or understanding.  You have sudden trouble seeing in one or both eyes.  You have sudden trouble walking.  You have dizziness.  You have a loss of balance or coordination.  You have a sudden, severe headache with no known cause.  You have new chest pain or an irregular heartbeat. Any of these symptoms may represent a serious problem that is an emergency. Do not wait to see if the symptoms will go away. Get medical help at once. Call your local emergency services (911 in [place] Do not drive yourself to the hospital. Document Released: 02/19/2004 Document Revised: 05/28/2013 Document Reviewed: 07/14/2012 [facility] Patient Information 2015 [place], [place]. This information is not intended to replace advice given  to you by your health care provider. Make sure you discuss any questions you have with your health care provider. Atrial Fibrillation Atrial fibrillation is a type of irregular heart rhythm (arrhythmia). During atrial fibrillation, the upper chambers of the heart (atria) quiver continuously in a chaotic pattern. This causes  an irregular and often rapid heart rate.  Atrial fibrillation is the result of the heart becoming overloaded with disorganized signals that tell it to beat. These signals are normally released one at a time by a part of the right atrium called the sinoatrial node. They then travel from the atria to the lower chambers of the heart (ventricles), causing the atria and ventricles to contract and pump blood as they pass. In atrial fibrillation, parts of the atria outside of the sinoatrial node also release these signals. This results in two problems. First, the atria receive so many signals that they do not have time to fully contract. Second, the ventricles, which can only receive one signal at a time, beat irregularly and out of rhythm with the atria.  There are three types of atrial fibrillation:   Paroxysmal. Paroxysmal atrial fibrillation starts suddenly and stops on its own within a week.  Persistent. Persistent atrial fibrillation lasts for more than a week. It may stop on its own or with treatment.  Permanent. Permanent atrial fibrillation does not go away. Episodes of atrial fibrillation may lead to permanent atrial fibrillation. Atrial fibrillation can prevent your heart from pumping blood normally. It increases your risk of stroke and can lead to heart failure.  CAUSES   Heart conditions, including a heart attack, heart failure, coronary artery disease, and heart valve conditions.   Inflammation of the sac that surrounds the heart (pericarditis).  Blockage of an artery in the lungs (pulmonary embolism).  Pneumonia or other infections.  Chronic lung disease.  Thyroid problems, especially if the thyroid is overactive (hyperthyroidism).  Caffeine, excessive alcohol use, and use of some illegal drugs.   Use of some medicines, including certain decongestants and diet pills.  Heart surgery.   Birth defects.  Sometimes, no cause can be found. When this happens, the atrial  fibrillation is called lone atrial fibrillation. The risk of complications from atrial fibrillation increases if you have lone atrial fibrillation and you are age 55 years or older. RISK FACTORS  Heart failure.  Coronary artery disease.  Diabetes mellitus.   High blood pressure (hypertension).   Obesity.   Other arrhythmias.   Increased age. SIGNS AND SYMPTOMS   A feeling that your heart is beating rapidly or irregularly.   A feeling of discomfort or pain in your chest.   Shortness of breath.   Sudden light-headedness or weakness.   Getting tired easily when exercising.   Urinating more often than normal (mainly when atrial fibrillation first begins).  In paroxysmal atrial fibrillation, symptoms may start and suddenly stop. DIAGNOSIS  Your health care provider may be able to detect atrial fibrillation when taking your pulse. Your health care provider may have you take a test called an ambulatory electrocardiogram (ECG). An ECG records your heartbeat patterns over a 24-hour period. You may also have other tests, such as:  Transthoracic echocardiogram (TTE). During echocardiography, sound waves are used to evaluate how blood flows through your heart.  Transesophageal echocardiogram (TEE).  Stress test. There is more than one type of stress test. If a stress test is needed, ask your health care provider about which type is best for you.  Chest X-ray exam.  Blood tests.  Computed tomography (CT). TREATMENT  Treatment may include:  Treating any underlying conditions. For example, if you have an overactive thyroid, treating the condition may correct atrial fibrillation.  Taking medicine. Medicines may be given to control a rapid heart rate or to prevent blood clots, heart failure, or a stroke.  Having a procedure to correct the rhythm of the heart:  Electrical cardioversion. During electrical cardioversion, a controlled, low-energy shock is delivered to the  heart through your skin. If you have chest pain, very low blood pressure, or sudden heart failure, this procedure may need to be done as an emergency.  Catheter ablation. During this procedure, heart tissues that send the signals that cause atrial fibrillation are destroyed.  Surgical ablation. During this surgery, thin lines of heart tissue that carry the abnormal signals are destroyed. This procedure can either be an open-heart surgery or a minimally invasive surgery. With the minimally invasive surgery, small cuts are made to access the heart instead of a large opening.  Pulmonary venous isolation. During this surgery, tissue around the veins that carry blood from the lungs (pulmonary veins) is destroyed. This tissue is thought to carry the abnormal signals. HOME CARE INSTRUCTIONS   Take medicines only as directed by your health care provider. Some medicines can make atrial fibrillation worse or recur.  If blood thinners were prescribed by your health care provider, take them exactly as directed. Too much blood-thinning medicine can cause bleeding. If you take too little, you will not have the needed protection against stroke and other problems.  Perform blood tests at home if directed by your health care provider. Perform blood tests exactly as directed.  Quit smoking if you smoke.  Do not drink alcohol.  Do not drink caffeinated beverages such as coffee, soda, and some teas. You may drink decaffeinated coffee, soda, or tea.   Maintain a healthy weight.Do not use diet pills unless your health care provider approves. They may make heart problems worse.   Follow diet instructions as directed by your health care provider.  Exercise regularly as directed by your health care provider.  Keep all follow-up visits as directed by your health care provider. This is important. PREVENTION  The following substances can cause atrial fibrillation to recur:   Caffeinated  beverages.  Alcohol.  Certain medicines, especially those used for breathing problems.  Certain herbs and herbal medicines, such as those containing ephedra or ginseng.  Illegal drugs, such as cocaine and amphetamines. Sometimes medicines are given to prevent atrial fibrillation from recurring. Proper treatment of any underlying condition is also important in helping prevent recurrence.  SEEK MEDICAL CARE IF:  You notice a change in the rate, rhythm, or strength of your heartbeat.  You suddenly begin urinating more frequently.  You tire more easily when exerting yourself or exercising. SEEK IMMEDIATE MEDICAL CARE IF:   You have chest pain, abdominal pain, sweating, or weakness.  You feel nauseous.  You have shortness of breath.  You suddenly have swollen feet and ankles.  You feel dizzy.  Your face or limbs feel numb or weak.  You have a change in your vision or speech. MAKE SURE YOU:   Understand these instructions.  Will watch your condition.  Will get help right away if you are not doing well or get worse. Document Released: 01/11/2005 Document Revised: 05/28/2013 Document Reviewed: 02/22/2012 [facility] Patient Information 2015 [place], [place]. This information is not intended to replace advice given to you by your health care  provider. Make sure you discuss any questions you have with your health care provider. ° °

## 2014-04-19 NOTE — Progress Notes (Signed)
  Echocardiogram 2D Echocardiogram has been performed.  Aaron Thompson 04/19/2014, 3:58 PM

## 2014-04-19 NOTE — Progress Notes (Signed)
The patient is scheduled to start [place]. The patient had a run of wide complex tachycardia. It is very difficult to assess the pacing spikes. This may have been ventricular tachycardia. Regardless of the exact definition of the rhythm, indication is still present to start Tikosyn. I have instructed the nurse to start the dose tonight at 8 PM. We know that the potassium and magnesium are in the appropriate range.  Daryel November, MD

## 2014-04-19 NOTE — Care Management Note (Deleted)
    Page 1 of 2   04/19/2014     3:30:47 PM S/W LAQUESHA @ [facility] # [id number]  TIKOSYN 500 MG BID COVER- YES CO-PAY- $ 25.00  FOR 31 DAY SUPPLY TIER- 2 DRUG PRIOR APPROVAL -NO PHARMACY - [place], [place] AND [place] MANAGEMENT NOTE 04/19/2014  Patient:  Aaron Thompson, Aaron Thompson   Account Number:  [id number]  Date Initiated:  04/19/2014  Documentation initiated by:  GRAVES-BIGELOW,Shannyn Jankowiak  Subjective/Objective Assessment:   Pt admitted for AFib RVR- Plan is to initiate Tikosyn. pt uses [facility]. CM did call to make sure Tikosyn is available and it can be dropped shipped. Benefits check in process.     Action/Plan:   Pt will be given a 7 day supply of Tikosyn via [place]. CM to assist with this. Cardiology Office may assist with the co pay card to be given to pt for co pay to be no more than $10.00   Anticipated DC Date:  04/22/2014   Anticipated DC Plan:  Long  CM consult  Medication Assistance      Choice offered to / List presented to:             Status of service:  In process, will continue to follow Medicare Important Message given?  NO (If response is "NO", the following Medicare IM given date fields will be blank) Date Medicare IM given:   Medicare IM given by:   Date Additional Medicare IM given:   Additional Medicare IM given by:    Discharge Disposition:  HOME/SELF CARE  Per UR Regulation:  Reviewed for med. necessity/level of care/duration of stay  If discussed at [place] of Stay Meetings, dates discussed:    Comments:  [place], RN, BSN [phone] S/W LAQUESHA @ [place] # [phone] TIKOSYN 500 MCG, 250 or 125 MCG  BID [place]- $ 25.00  FOR 31 [place], [place]

## 2014-04-19 NOTE — ED Notes (Signed)
Cardiology at bedside.

## 2014-04-19 NOTE — Progress Notes (Addendum)
Pharmacy Consult for Dofetilide (Tikosyn) Initiation  Admit Complaint: 54 y.o. male admitted 04/18/2014 with atrial fibrillation to be initiated on dofetilide.   Assessment:  Patient Exclusion Criteria: If any screening criteria checked as "Yes", then  patient  should NOT receive dofetilide until criteria item is corrected. If "Yes" please indicate correction plan.  YES  NO Patient  Exclusion Criteria Correction Plan  [x]  []  Baseline QTc interval is greater than or equal to 440 msec. IF above YES box checked dofetilide contraindicated unless patient has ICD; then may proceed if QTc 500-550 msec or with known ventricular conduction abnormalities may proceed with QTc 550-600 msec. QTc =  494 on 04/19/14, Patient with ICD    []  [x]  Magnesium level is less than 1.8 mEq/l : Last magnesium:  Lab Results  Component Value Date   MG 1.9 04/18/2014         [x]  []  Potassium level is less than 4 mEq/l : Last potassium:  Lab Results  Component Value Date   K 3.6 04/19/2014  Supplemented with KCl 40 mEq      Potassium 21meq given this am with re-check at noon  []  [x]  Patient is known or suspected to have a digoxin level greater than 2 ng/ml: Lab Results  Component Value Date   DIGOXIN 0.2* 04/18/2014      []  [x]  Creatinine clearance less than 20 ml/min (calculated using Cockcroft-Gault, actual body weight and serum creatinine): Estimated Creatinine Clearance: 159.5 mL/min (by C-G formula based on Cr of 0.62).    []  [x]  Patient has received drugs known to prolong the QT intervals within the last 48 hours(phenothiazines, tricyclics or tetracyclic antidepressants, erythromycin, H-1 antihistamines, cisapride, fluoroquinolones, azithromycin). Drugs not listed above may have an, as yet, undetected potential to prolong the QT interval, updated information on QT prolonging agents is available at this website:QT prolonging agents   [x]  []  Patient received a dose of hydrochlorothiazide (Oretic) alone or  in any combination including triamterene (Dyazide, Maxzide) in the last 48 hours.  MD aware of dose within 24 hours but monitoring K closely  MD aware of dose within 24 hours but monitoring K closely   []  [x]  Patient received a medication known to increase dofetilide plasma concentrations prior to initial dofetilide dose:  . Trimethoprim (Primsol, Proloprim) in the last 36 hours  . Verapamil (Calan, Verelan) in the last 36 hours or a sustained release dose in the last 72 hours . Megestrol (Megace) in the last 5 days  . Cimetidine (Tagamet) in the last 6 hours . Ketoconazole (Nizoral) in the last 24 hours . Itraconazole (Sporanox) in the last 48 hours  . Prochlorperazine (Compazine) in the last 36 hours    []  [x]  Patient is known to have a history of torsades de pointes; congenital or acquired long QT syndromes.   []  [x]  Patient has received a Class 1 antiarrhythmic with less than 2 half-lives since last dose. (Disopyramide, Quinidine, Procainamide, Lidocaine, Mexiletine, Flecainide, Propafenone)   []  [x]  Patient has received amiodarone therapy in the past 3 months or amiodarone level is greater than 0.3 ng/ml.    Patient is not currently anticoagulated because patient is reluctant to do so. ICD shocks converted to SR. MD aware.   Ordering provider was confirmed at [email or web address] if they are not listed on the [place] [facility] Prescribers list.  Goal of Therapy: Follow renal function, electrolytes, potential drug interactions, and dose adjustment. Provide education and 1 week supply at discharge.  Plan:  [x]   Physician  selected initial dose within range recommended for patients level of renal function - will monitor for response.  []   Physician selected initial dose outside of range recommended for patients level of renal function - will discuss if the dose should be altered at this time.   Select One Calculated CrCl  Dose q12h  [x]  > 60 ml/min 500 mcg  []  40-60 ml/min 250 mcg  []   20-40 ml/min 125 mcg   2. Follow up QTc after the first 5 doses, renal function, electrolytes (K & Mg) daily x 3     days, dose adjustment, success of initiation and facilitate 1 week discharge supply as     clinically indicated.  3. Initiate Tikosyn education video (Call [phone] and ask for video # [phone]).  4. Place Enrollment Form on the chart for discharge supply of dofetilide.  Hildred Laser, Pharm D 04/19/2014 11:37 AM

## 2014-04-19 NOTE — Progress Notes (Signed)
Inpatient Diabetes Program Recommendations  AACE/ADA: New Consensus Statement on Inpatient Glycemic Control (2013)  Target Ranges:  Prepandial:   less than 140 mg/dL      Peak postprandial:   less than 180 mg/dL (1-2 hours)      Critically ill patients:  140 - 180 mg/dL   Results for SANCHEZ, HEMMER (MRN [id number]) as of 04/19/2014 10:46  Ref. Range 04/18/2014 21:31 04/19/2014 01:19 04/19/2014 02:25 04/19/2014 06:33  Glucose Latest Range: 70-99 mg/dL 386 (H)   230 (H)    Reason for Visit: ICD firing  Diabetes history: DM 2 Outpatient Diabetes medications: none Current orders for Inpatient glycemic control: none  Inpatient Diabetes Program Recommendations  Correction (SSI): Patient has a history of DM. Glucose currently elevated. Please order CBGs and Novolog 0-9 units (sensitive scale) TID, Novolog 0-5 units QHS for bedtime coverage. HgbA1C: Last A1c 10.5% on 08/08/13. Please consider ordering A1c to assess glucose control over the past 2-3 months.  Thanks,  Tama Headings RN, MSN, [facility] Inpatient Diabetes Coordinator Team Pager [phone]

## 2014-04-19 NOTE — H&P (Signed)
HPI: [name] is a 54 yo man, pt of Dr. Johnsie Thompson and Dr. Caryl Thompson, with NICM, EF 30-35%, s/p CRT-D, pAF with multiple previous cardioversions, HTN, OSA who presents today with chief complaint of ICD discharge x 3.  Patient states he was at work and and had just finished getting of a ladder when the first shock occurred.  He felt short of breath but otherwise no other symptoms.  This was followed by another 2 shocks, he reports they occurred about 30 seconds apart from one another.   He denies any chest pain except the site of his defibrillator. He states the 3rd shock was so strong he briefly loss consciousness.  Has not felt any different or ill prior to coming into the hospital. Does not feel short of breath currently. No dizziness.  He denies any previous shocks since implantation on 01/2013.  He denies any change in medication.   In the ED, he denies any current symptoms.  EDP reports [facility] reported 4 shocks for AF RVR with atrial rate of 300s and ventricular rate of 200s with return to SR.  ECG and telemetry show ST with BiV pacing.  Labs with Mg 1.9, K 3.7, Dig 0.2 and troponin 0.04.    Review of Systems:     Cardiac Review of Systems: {Y] = yes [ ]  = no  Chest Pain [    ]  Resting SOB [   ] Exertional SOB  [  ]  Orthopnea [  ]   Pedal Edema [   ]    Palpitations [  ] Syncope  [  ]   Presyncope [   ]  General Review of Systems: [Y] = yes [  ]=no Constitional: recent weight change [  ]; anorexia [  ]; fatigue [  ]; nausea [  ]; night sweats [  ]; fever [  ]; or chills [  ];                                                                     Dental: poor dentition[  ];   Eye : blurred vision [  ]; diplopia [   ]; vision changes [  ];  Amaurosis fugax[  ]; Resp: cough [  ];  wheezing[  ];  hemoptysis[  ]; shortness of breath[  ]; paroxysmal nocturnal dyspnea[  ]; dyspnea on exertion[  ]; or orthopnea[  ];  GI:  gallstones[  ], vomiting[  ];  dysphagia[  ]; melena[  ];  hematochezia [  ]; heartburn[   ];   GU: kidney stones [  ]; hematuria[  ];   dysuria [  ];  nocturia[  ];               Skin: rash [  ], swelling[  ];, hair loss[  ];  peripheral edema[  ];  or itching[  ]; Musculosketetal: myalgias[  ];  joint swelling[  ];  joint erythema[  ];  joint pain[  ];  back pain[  ];  Heme/Lymph: bruising[  ];  bleeding[  ];  anemia[  ];  Neuro: TIA[  ];  headaches[  ];  stroke[  ];  vertigo[  ];  seizures[  ];  paresthesias[  ];  difficulty walking[  ];  Psych:depression[  ]; anxiety[  ];  Endocrine: diabetes[  ];  thyroid dysfunction[  ];  Other:  Past Medical History  Diagnosis Date  . Hypertension   . DJD (degenerative joint disease)   . Dermatophytosis of the body   . Esophageal reflux   . Other chronic nonalcoholic liver disease     fatty liver  . Unspecified hearing loss   . Anxiety state, unspecified   . Non-ischemic cardiomyopathy     a.  cath 6/11: OM2 30%; EF 30%;  b. echo 2/12: EF 30%, mild MR, mild LAE;  c.  Echo (11/14):  Mod LVH, EF 25-30%, diff HK, mod LAE.   [name] Atrial fibrillation     a. s/p DCCV 6/11; previously on Pradaxa;  b. Event Monitor 2012->No PAF;  c. 09/2011 s/p DCCV ->Xarelto started.  [name] LBBB (left bundle branch block)     intermittent  . Hx of colonic polyps   . Osteoarthrosis, unspecified whether generalized or localized, hand   . Diarrhea   . Gastroenteritis   . Abdominal pain, right upper quadrant   . Folliculitis   . Automatic implantable cardioverter-defibrillator in situ   . CHF (congestive heart failure)   . OSA on CPAP   . Type II diabetes mellitus   . Calculus of kidney 1981    "passed it on my own"    No current facility-administered medications on file prior to encounter.   Current Outpatient Prescriptions on File Prior to Encounter  Medication Sig Dispense Refill  . aspirin EC 81 MG tablet Take 1 tablet (81 mg total) by mouth daily. 90 tablet 3  . carvedilol (COREG) 25 MG tablet TAKE 1 TABLET BY MOUTH 2 TIMES DAILY WITH A MEAL. 60  tablet 0  . digoxin (LANOXIN) 0.25 MG tablet TAKE 1 TABLET (250 MCG TOTAL) BY MOUTH DAILY. 30 tablet 0  . losartan-hydrochlorothiazide (HYZAAR) 50-12.5 MG per tablet TAKE 1 TABLET BY MOUTH DAILY. 30 tablet 11  . Multiple Vitamin (MULTIVITAMIN) tablet Take 1 tablet by mouth daily.      [name] Bicarbonate (ZEGERID OTC) 20-1100 MG CAPS Take 1 capsule by mouth daily.     [name] spironolactone (ALDACTONE) 25 MG tablet TAKE 1 TABLET (25 MG TOTAL) BY MOUTH DAILY. 90 tablet 3  . glucose blood (ONE TOUCH ULTRA TEST) test strip Check glucose twice daily and as needed, Dx. 250.00 uncontrolled DM 100 each 3      No Known Allergies  History   Social History  . Marital Status: Married    Spouse Name: [name]  . Number of Children: 2  . Years of Education: [name]   Occupational History  . PAINTER Unemployed   Social History Main Topics  . Smoking status: Never Smoker   . Smokeless tobacco: Former Systems developer    Types: Snuff     Comment: 02/14/2013 "quit snuff in 1006"  . Alcohol Use: No  . Drug Use: No  . Sexual Activity: Yes   Other Topics Concern  . Not on file   Social History Narrative   Lives in [place] with wife.  Works @ [employer] in [employer].  Not routinely exercising.    Family History  Problem Relation Age of Onset  . Hypertension Mother   . Diabetes Mother   . Hypertension Father   . Diabetes Father   . Diabetes Brother   . Kidney disease Brother   . Diabetes Brother   . Heart  failure Mother     Died @ 53  . Heart failure Father     Died @ 20  . Other Sister     4 sisters A&W    PHYSICAL EXAM: Filed Vitals:   04/19/14 0001  BP: 153/92  Pulse: 95  Temp:   Resp: 16   General:  Obese, anxious but well appearing. No respiratory difficulty HEENT: normal Neck: supple. no JVD. Carotids 2+ bilat; no bruits. No lymphadenopathy or thryomegaly appreciated. Cor: PMI nondisplaced. Distant, regular rate & rhythm. No rubs, gallops or murmurs. Lungs:  clear Abdomen: soft, nontender, nondistended. No hepatosplenomegaly. No bruits or masses. Good bowel sounds. Extremities: no cyanosis, clubbing, rash, edema Neuro: alert & oriented x 3, cranial nerves grossly intact. moves all 4 extremities w/o difficulty. Affect pleasant.  ECG:  Sinus tach with BiV pacing  Results for orders placed or performed during the hospital encounter of 04/18/14 (from the past 24 hour(s))  Basic metabolic panel     Status: Abnormal   Collection Time: 04/18/14  9:31 PM  Result Value Ref Range   Sodium 138 135 - 145 mmol/L   Potassium 3.7 3.5 - 5.1 mmol/L   Chloride 102 96 - 112 mmol/L   CO2 28 19 - 32 mmol/L   Glucose, Bld 386 (H) 70 - 99 mg/dL   BUN 21 6 - 23 mg/dL   Creatinine, Ser 0.87 0.50 - 1.35 mg/dL   Calcium 9.5 8.4 - 10.5 mg/dL   GFR calc non Af Amer >90 >90 mL/min   GFR calc Af Amer >90 >90 mL/min   Anion gap 8 5 - 15  Troponin I     Status: Abnormal   Collection Time: 04/18/14  9:31 PM  Result Value Ref Range   Troponin I 0.04 (H) <0.031 ng/mL  CBC with Differential     Status: None   Collection Time: 04/18/14  9:31 PM  Result Value Ref Range   WBC 7.1 4.0 - 10.5 K/uL   RBC 5.39 4.22 - 5.81 MIL/uL   Hemoglobin 15.7 13.0 - 17.0 g/dL   HCT 45.6 39.0 - 52.0 %   MCV 84.6 78.0 - 100.0 fL   MCH 29.1 26.0 - 34.0 pg   MCHC 34.4 30.0 - 36.0 g/dL   RDW 13.8 11.5 - 15.5 %   Platelets 176 150 - 400 K/uL   Neutrophils Relative % 65 43 - 77 %   Neutro Abs 4.6 1.7 - 7.7 K/uL   Lymphocytes Relative 26 12 - 46 %   Lymphs Abs 1.8 0.7 - 4.0 K/uL   Monocytes Relative 7 3 - 12 %   Monocytes Absolute 0.5 0.1 - 1.0 K/uL   Eosinophils Relative 2 0 - 5 %   Eosinophils Absolute 0.2 0.0 - 0.7 K/uL   Basophils Relative 0 0 - 1 %   Basophils Absolute 0.0 0.0 - 0.1 K/uL  Magnesium     Status: None   Collection Time: 04/18/14  9:31 PM  Result Value Ref Range   Magnesium 1.9 1.5 - 2.5 mg/dL  Digoxin level     Status: Abnormal   Collection Time: 04/18/14  9:31  PM  Result Value Ref Range   Digoxin Level 0.2 (L) 0.8 - 2.0 ng/mL   Dg Chest Port 1 View  04/18/2014   CLINICAL DATA:  Felt defibrillator fire three times today. Initial encounter.  EXAM: PORTABLE CHEST - 1 VIEW  COMPARISON:  Chest radiograph performed 02/15/2013  FINDINGS: The lungs are well-aerated. Mild  bibasilar atelectasis or scarring is noted. There is no evidence of pleural effusion or pneumothorax.  The cardiomediastinal silhouette is normal in size. A pacemaker/AICD is noted overlying the left chest wall, with leads ending overlying the right atrium, right ventricle and coronary sinus. No acute osseous abnormalities are seen. Cervical spinal fusion hardware is noted.  IMPRESSION: Mild bibasilar atelectasis or scarring noted. Lungs otherwise clear.   Electronically Signed   By: Garald Balding M.D.   On: 04/18/2014 21:47     ASSESSMENT: [name] is a 54 yo man, pt of Dr. Johnsie Thompson and Dr. Caryl Thompson, with NICM, EF 30-35%, s/p CRT-D, pAF with multiple previous cardioversions, HTN, OSA who presents today with chief complaint of ICD discharge x 3.  Per EDP, report from [facility] states 4 shocks for AF RVR with conversion to SR, however EGM not available to confirm.  It is likely that these were inappropriate discharges for AF RVR as he has a NICM with primary prevention indication and was relatively asymptomatic prior to discharges.  No significant metabolic disturbances on labs.    PLAN/DISCUSSION: -Admit to step down given multiple discharges. He is already on high dose BB and on digoxin as possible rate controlling agents to provide rate control if AF recurs.  Ideally would add other agent to prevent RVR from meeting detection/therapy, however, this is somewhat limiting by his low EF.  I believe it might be best to consider AAD in attempt to suppress AF burden and help reduce chances of future shocks.  Will need official interrogation of device, however, per report his ventricular rate was quite fast which  makes changes to device setting less likely to help reduce chances of recurrence.  Other potential option would be to consider catheter based ablation (PVI if confirmed AF on interrogation). -Would consider AAD therapy, however, he is young and would want to avoid long term amiodarone.  He does have a h/o CHF though NYHA class I making Sotalol and option.  Tikosyn would also be feasible but will defer to EP.  -Electrolyte repletion -cycle troponins -consider echocardiogram in AM to ensure no changes in LVF -continue coreg and losartan for NICM

## 2014-04-19 NOTE — Progress Notes (Signed)
Pt had a positive troponin of 0.22.  Pt denies any CP. Md on call paged. Will cont to monitor pt.

## 2014-04-19 NOTE — Progress Notes (Signed)
UR Completed Stephaun Million Graves-Bigelow, RN,BSN [phone]  

## 2014-04-19 NOTE — Consult Note (Signed)
ELECTROPHYSIOLOGY CONSULT NOTE    Patient ID: Aaron Thompson MRN: [id number], DOB/AGE: 03/30/1960 52 y.o.  Admit date: 04/18/2014 Date of Consult: 04/19/2014  Primary Physician: [name], MD Primary Cardiologist: [name] Electrophysiologist: Caryl Comes  Reason for Consultation:  ICD shocks for AF with RVR  HPI:  Aaron Thompson is a 54 y.o. male with a past medical history significant for hypertension, non-ischemic cardiomyopathy, paroxysmal atrial fibrillation, chronic systolic heart failure, sleep apnea (on CPAP), diabetes. He was working yesterday when he developed acute shortness of breath and received 4 ICD shocks. EMS was called and he was brought to [facility] for further evaluation. Device interrogation demonstrated inappropriate therapy for AF with RVR with ventricular rates >200.  He converted to SR with ICD shocks. Device interrogation otherwise normal.    He reports that recently he has not been having chest pain, shortness of breath, LE edema, recent fevers, chills, nausea or vomiting.  He has had increased stress related to his job.   Last echo 05-2013 demonstrated EF 35%, diffuse hypokinesis, grade 1 diastolic dysfunction, moderately dilated aortic root, LA 48. Lab work on admission is notable for minimally elevated troponin, likely to due to AF with RVR and ICD shocks.  Electrolytes normal.   Past Medical History  Diagnosis Date  . Hypertension   . DJD (degenerative joint disease)   . Dermatophytosis of the body   . Esophageal reflux   . Other chronic nonalcoholic liver disease     fatty liver  . Unspecified hearing loss   . Anxiety state, unspecified   . Non-ischemic cardiomyopathy     a.  cath 6/11: OM2 30%; EF 30%;  b. echo 2/12: EF 30%, mild MR, mild LAE;  c.  Echo (11/14):  Mod LVH, EF 25-30%, diff HK, mod LAE.   [name] Atrial fibrillation     a. s/p DCCV 6/11; previously on Pradaxa;  b. Event Monitor 2012->No PAF;  c. 09/2011 s/p DCCV ->Xarelto started.  [name] LBBB (left bundle  branch block)     intermittent  . Hx of colonic polyps   . Osteoarthrosis, unspecified whether generalized or localized, hand   . Diarrhea   . Gastroenteritis   . Abdominal pain, right upper quadrant   . Folliculitis   . Automatic implantable cardioverter-defibrillator in situ   . CHF (congestive heart failure)   . OSA on CPAP   . Type II diabetes mellitus   . Calculus of kidney 1981    "passed it on my own"     Surgical History:  Past Surgical History  Procedure Laterality Date  . Neck surgery      plate/fusion  . Bilateral knee arthroscopy    . Cardioversion  06/2009    Non ischemic cardiomyopathy - cath and echo  . Stress cardiolite  12/02/1998    Normal EF 52%  . Stress cardiolite  08/2005    Normal, EF 42%  . Cardiovascular stress test  2006    Neg per pt  . Upper gastrointestinal endoscopy  08/2006    GERD  . Abdominal US  01/2007    Fatty liver, no gallstones  . Doppler echocardiography  11/2009    Decreased EF of 35-40%  . Appendectomy    . Cardioversion  09/29/2011    Procedure: CARDIOVERSION;  Surgeon: Thayer Headings, MD;  Location: [facility];  Service: Cardiovascular;  Laterality: [name];  . Bi-ventricular implantable cardioverter defibrillator  (crt-d)  02/14/2013  . Left heart catheterization with coronary angiogram [name] 08/18/2011  Procedure: LEFT HEART CATHETERIZATION WITH CORONARY ANGIOGRAM;  Surgeon: Peter M Martinique, MD;  Location[facility] CATH LAB;  Service: Cardiovascular;  Laterality: [name];  . Bi-ventricular implantable cardioverter defibrillator [name] 02/14/2013    Procedure: BI-VENTRICULAR IMPLANTABLE CARDIOVERTER DEFIBRILLATOR  (CRT-D);  Surgeon: Deboraha Sprang, MD;  Location: [facility] CATH LAB;  Service: Cardiovascular;  Laterality: [name];     Prescriptions prior to admission  Medication Sig Dispense Refill Last Dose  . aspirin EC 81 MG tablet Take 1 tablet (81 mg total) by mouth daily. 90 tablet 3 04/17/2014 at Unknown time  . carvedilol (COREG) 25 MG tablet TAKE 1 TABLET  BY MOUTH 2 TIMES DAILY WITH A MEAL. 60 tablet 0 04/18/2014 at Unknown time  . digoxin (LANOXIN) 0.25 MG tablet TAKE 1 TABLET (250 MCG TOTAL) BY MOUTH DAILY. 30 tablet 0 04/18/2014 at Unknown time  . losartan-hydrochlorothiazide (HYZAAR) 50-12.5 MG per tablet TAKE 1 TABLET BY MOUTH DAILY. 30 tablet 11 04/18/2014 at Unknown time  . Multiple Vitamin (MULTIVITAMIN) tablet Take 1 tablet by mouth daily.     04/18/2014 at Unknown time  . Omeprazole-Sodium Bicarbonate (ZEGERID OTC) 20-1100 MG CAPS Take 1 capsule by mouth daily.    04/18/2014 at Unknown time  . spironolactone (ALDACTONE) 25 MG tablet TAKE 1 TABLET (25 MG TOTAL) BY MOUTH DAILY. 90 tablet 3 04/18/2014 at Unknown time  . glucose blood (ONE TOUCH ULTRA TEST) test strip Check glucose twice daily and as needed, Dx. 250.00 uncontrolled DM 100 each 3 Taking    Inpatient Medications:  . aspirin EC  81 mg Oral Daily  . carvedilol  25 mg Oral BID WC  . enoxaparin (LOVENOX) injection  60 mg Subcutaneous Q24H  . losartan  50 mg Oral Daily  . pantoprazole  40 mg Oral Daily  . spironolactone  25 mg Oral Daily    Allergies: No Known Allergies  History   Social History  . Marital Status: Married    Spouse Name: [name]  . Number of Children: 2  . Years of Education: [name]   Occupational History  . PAINTER Unemployed   Social History Main Topics  . Smoking status: Never Smoker   . Smokeless tobacco: Former Systems developer    Types: Snuff     Comment: 02/14/2013 "quit snuff in 1006"  . Alcohol Use: No  . Drug Use: No  . Sexual Activity: Yes   Other Topics Concern  . Not on file   Social History Narrative   Lives in [place] with wife.  Works @ [employer] in [employer].  Not routinely exercising.     Family History  Problem Relation Age of Onset  . Hypertension Mother   . Diabetes Mother   . Hypertension Father   . Diabetes Father   . Diabetes Brother   . Kidney disease Brother   . Diabetes Brother   . Heart failure Mother     Died @ 30    . Heart failure Father     Died @ 21  . Other Sister     4 sisters A&W     Review of Systems: All other systems reviewed and are otherwise negative except as noted above.  Physical Exam: Filed Vitals:   04/19/14 0030 04/19/14 0450 04/19/14 0550 04/19/14 0742  BP: 142/88 123/69 132/81 137/83  Pulse:  71 82 91  Temp: 98.3 F (36.8 C)  98.7 F (37.1 C) 98.2 F (36.8 C)  TempSrc: Oral  Oral Oral  Resp: 20 17 16 20   Height:  6\' 4"  (1.93 m)     Weight: 288 lb 6.4 oz (130.817 kg)     SpO2: 98% 99% 99% 98%    GEN- The patient is obese appearing, alert and oriented x 3 today.   HEENT: normocephalic, atraumatic; sclera clear, conjunctiva pink; hearing intact; oropharynx clear; neck supple, no JVP Lymph- no cervical lymphadenopathy Lungs- Clear to ausculation bilaterally, normal work of breathing.  No wheezes, rales, rhonchi Heart- Regular rate and rhythm, no murmurs, rubs or gallops  GI- soft, non-tender, non-distended, bowel sounds present  Extremities- no clubbing, cyanosis, or edema; DP/PT/radial pulses 2+ bilaterally MS- no significant deformity or atrophy Skin- warm and dry, no rash or lesion Psych- euthymic mood, full affect Neuro- strength and sensation are intact  Labs:   Lab Results  Component Value Date   WBC 7.2 04/19/2014   HGB 15.3 04/19/2014   HCT 44.2 04/19/2014   MCV 84.5 04/19/2014   PLT 150 04/19/2014     Recent Labs Lab 04/19/14 0633  NA 135  K 3.6  CL 100  CO2 27  BUN 18  CREATININE 0.62  CALCIUM 8.9  GLUCOSE 230*      Radiology/Studies: Dg Chest Port 1 View 04/18/2014   CLINICAL DATA:  Felt defibrillator fire three times today. Initial encounter.  EXAM: PORTABLE CHEST - 1 VIEW  COMPARISON:  Chest radiograph performed 02/15/2013  FINDINGS: The lungs are well-aerated. Mild bibasilar atelectasis or scarring is noted. There is no evidence of pleural effusion or pneumothorax.  The cardiomediastinal silhouette is normal in size. A pacemaker/AICD is  noted overlying the left chest wall, with leads ending overlying the right atrium, right ventricle and coronary sinus. No acute osseous abnormalities are seen. Cervical spinal fusion hardware is noted.  IMPRESSION: Mild bibasilar atelectasis or scarring noted. Lungs otherwise clear.   Electronically Signed   By: Garald Balding M.D.   On: 04/18/2014 21:47    SHF:WYOVZ rhythm with ventricular pacing, rate 78  TELEMETRY: sinus rhythm with ventricular pacing  DEVICE HISTORY: STJ CRTD implanted 01/2013 by Dr Caryl Comes.   Assessment/Plan: 1.  Paroxysmal atrial fibrillation with RVR The patient received inappropriate ICD therapy for AF with RVR, ventricular rates 240's.  ICD shocks converted to SR.  Device interrogation demonstrates very small burden of AF over the last year.   CHADS2VASC score is at least 3.  He was previously on Xarelto, but this was discontinued 04/2013 per the patient's desire.  Will discuss with Dr Caryl Comes who knows the patient well.  With RVR and inappropriate ICD therapy despite BB, would initiate AAD therapy at this time.   The patient is willing to start Tikosyn.  Last dose HCTZ yesterday, K low this morning and supplemented, will recheck at 12Noon.  If K >3.9, will initiate Tikosyn 514mcg twice daily to start tonight. Follow QTc (450-470's recently)   2.  Chronic systolic heart failure/non-ischemic cardiomyopathy Euvolemic on exam Continue BB, Spironolactone, Losartan Discontinue Digoxin  3.  Sleep apnea Continue CPAP  4.  Obesity Weight loss advised  5.  HTN Increase Coreg to 37.5mg  twice daily at this time   Signed, [name], NP 04/19/2014 10:59 AM    I have seen, examined the patient, and reviewed the above assessment and plan.  On exam today he is RRR.  Changes to above are made where necessary.  Device interrogation is reviewed and reveals ICD shock for Afib.  Per discussion with Dr Caryl Comes, will start tikosyn. chads2vasc score is at least 3.  He should  be  anticoagulated but is reluctant to do so. Increase coreg to 37.5mg  BID.  Will need to remain in the hospital for tikosyn load over the weekend.   Co Sign: [name], MD 04/19/2014 11:10 AM

## 2014-04-20 LAB — BASIC METABOLIC PANEL
ANION GAP: 8 (ref 5–15)
BUN: 16 mg/dL (ref 6–23)
CO2: 26 mmol/L (ref 19–32)
Calcium: 8.8 mg/dL (ref 8.4–10.5)
Chloride: 102 mmol/L (ref 96–112)
Creatinine, Ser: 0.65 mg/dL (ref 0.50–1.35)
GFR calc Af Amer: >90 mL/min (ref >90)
GFR calc non Af Amer: >90 mL/min (ref >90)
GLUCOSE: 245 mg/dL — AB (ref 70–99)
Potassium: 3.9 mmol/L (ref 3.5–5.1)
SODIUM: 136 mmol/L (ref 135–145)

## 2014-04-20 LAB — GLUCOSE, CAPILLARY
GLUCOSE-CAPILLARY: 222 mg/dL — AB (ref 70–99)
GLUCOSE-CAPILLARY: 210 mg/dL — AB (ref 70–99)
Glucose-Capillary: 236 mg/dL — ABNORMAL HIGH (ref 70–99)
Glucose-Capillary: 231 mg/dL — ABNORMAL HIGH (ref 70–99)

## 2014-04-20 LAB — MAGNESIUM: Magnesium: 2 mg/dL (ref 1.5–2.5)

## 2014-04-20 LAB — HEMOGLOBIN A1C
Hgb A1c MFr Bld: 10.6 % — ABNORMAL HIGH (ref 4.8–5.6)
Mean Plasma Glucose: 258 mg/dL

## 2014-04-20 MED ORDER — CHLORHEXIDINE GLUCONATE CLOTH 2 % EX PADS
6.0000 | MEDICATED_PAD | Freq: Once | CUTANEOUS | Status: AC
  Administered 2014-04-20: 6 via TOPICAL

## 2014-04-20 MED ORDER — POTASSIUM CHLORIDE CRYS ER 20 MEQ PO TBCR
40.0000 meq | EXTENDED_RELEASE_TABLET | Freq: Every day | ORAL | Status: DC
  Administered 2014-04-20 – 2014-04-22 (×3): 40 meq via ORAL
  Filled 2014-04-20 (×3): qty 2

## 2014-04-20 NOTE — Progress Notes (Signed)
[facility]       Patient Name: Aaron Thompson      SUBJECTIVE admited with inapprroprialte shocks for atrial fibrillation and rapid rate occurring in the context of a nonischemic cardiomyopathy for which she has undergone primary prevention ICD. Heart rates were faster than 200 bpm. He has a history of hypertension.  Hydrochlorothiazide has been discontinued  He remains on losartan. He has not been on anticoagulation  Past Medical History  Diagnosis Date  . Hypertension   . DJD (degenerative joint disease)   . Dermatophytosis of the body   . Esophageal reflux   . Other chronic nonalcoholic liver disease     fatty liver  . Unspecified hearing loss   . Anxiety state, unspecified   . Non-ischemic cardiomyopathy     a.  cath 6/11: OM2 30%; EF 30%;  b. echo 2/12: EF 30%, mild MR, mild LAE;  c.  Echo (11/14):  Mod LVH, EF 25-30%, diff HK, mod LAE.   [name] Atrial fibrillation     a. s/p DCCV 6/11; previously on Pradaxa;  b. Event Monitor 2012->No PAF;  c. 09/2011 s/p DCCV ->Xarelto started.  [name] LBBB (left bundle branch block)     intermittent  . Hx of colonic polyps   . Osteoarthrosis, unspecified whether generalized or localized, hand   . Diarrhea   . Gastroenteritis   . Abdominal pain, right upper quadrant   . Folliculitis   . Automatic implantable cardioverter-defibrillator in situ   . CHF (congestive heart failure)   . OSA on CPAP   . Type II diabetes mellitus   . Calculus of kidney 1981    "passed it on my own"    Scheduled Meds:  Scheduled Meds: . aspirin EC  81 mg Oral Daily  . carvedilol  37.5 mg Oral BID WC  . dofetilide  500 mcg Oral BID  . enoxaparin (LOVENOX) injection  60 mg Subcutaneous Q24H  . insulin aspart  0-5 Units Subcutaneous QHS  . insulin aspart  0-9 Units Subcutaneous TID WC  . losartan  50 mg Oral Daily  . pantoprazole  40 mg Oral Daily  . sodium chloride  3 mL Intravenous Q12H  . spironolactone  25 mg Oral Daily   Continuous Infusions:  sodium  chloride, acetaminophen, nitroGLYCERIN, ondansetron (ZOFRAN) IV, sodium chloride    PHYSICAL EXAM Filed Vitals:   04/19/14 2000 04/20/14 0000 04/20/14 0400 04/20/14 0750  BP: 122/81 105/62 113/71 128/88  Pulse: 89 72 72 79  Temp: 98.2 F (36.8 C) 98.2 F (36.8 C) 98.2 F (36.8 C) 97.7 F (36.5 C)  TempSrc: Oral Oral Oral Oral  Resp:  20 18 20   Height:      Weight:   275 lb 14.4 oz (125.147 kg)   SpO2: 98% 98% 99% 98%   Well developed and nourished in no acute distress HENT normal Neck supple with JVP-flat Clear Regular rate and rhythm, no murmurs or gallops Abd-soft with active BS No Clubbing cyanosis edema Skin-warm and dry A & Oriented  Grossly normal sensory and motor function\   TELEMETRY: Reviewed telemetry pt in *nsr   Intake/Output Summary (Last 24 hours) at 04/20/14 1033 Last data filed at 04/20/14 0853  Gross per 24 hour  Intake    780 ml  Output   1500 ml  Net   -720 ml    LABS: Basic Metabolic Panel:  Recent Labs Lab 04/18/14 2131 04/19/14 0225 04/19/14 0633 04/19/14 1215 04/19/14 1734 04/20/14 0315  NA 138  --  135 134* 132* 136  K 3.7  --  3.6 3.8 5.0 3.9  CL 102  --  100 101 100 102  CO2 28  --  27 27 23 26   GLUCOSE 386*  --  230* 242* 237* 245*  BUN 21  --  18 19 19 16   CREATININE 0.87 0.63 0.62 0.61 0.67 0.65  CALCIUM 9.5  --  8.9 9.0 8.8 8.8  MG 1.9  --   --   --   --  2.0   Cardiac Enzymes:  Recent Labs  04/19/14 0225 04/19/14 0633 04/19/14 1215  TROPONINI 0.22* 0.14* 0.10*   CBC:  Recent Labs Lab 04/18/14 2131 04/19/14 0225 04/19/14 0633  WBC 7.1 7.5 7.2  NEUTROABS 4.6  --   --   HGB 15.7 15.2 15.3  HCT 45.6 43.6 44.2  MCV 84.6 85.0 84.5  PLT 176 164 150   PROTIME: No results for input(s): LABPROT, INR in the last 72 hours. Liver Function Tests: No results for input(s): AST, ALT, ALKPHOS, BILITOT, PROT, ALBUMIN in the last 72 hours. No results for input(s): LIPASE, AMYLASE in the last 72 hours. BNP: BNP  (last 3 results) No results for input(s): BNP in the last 8760 hours.  ProBNP (last 3 results) No results for input(s): PROBNP in the last 8760 hours.  D-Dimer: No results for input(s): DDIMER in the last 72 hours. Hemoglobin A1C:  Recent Labs  04/19/14 0633  HGBA1C 10.6*   Fasting Lipid Panel: No results for input(s): CHOL, HDL, LDLCALC, TRIG, CHOLHDL, LDLDIRECT in the last 72 hours. Thyroid Function Tests:  Recent Labs  04/19/14 0225  TSH 1.227   Anemia Panel: No results for input(s): VITAMINB12, FOLATE, FERRITIN, TIBC, IRON, RETICCTPCT in the last 72 hours.   Device Interrogation  Reviewed willneed reprogramming  ASSESSMENT AND PLAN:  Principal Problem:   ICD (implantable cardioverter-defibrillator) discharge Active Problems:   Nonischemic cardiomyopathy   Atrial fibrillation with RVR  Add metop succinate for augmented rate control Continue dofetilide Will need to review work situation  Signed, Virl Axe MD  04/20/2014

## 2014-04-21 DIAGNOSIS — E876 Hypokalemia: Secondary | ICD-10-CM

## 2014-04-21 LAB — BASIC METABOLIC PANEL
Anion gap: 4 — ABNORMAL LOW (ref 5–15)
BUN: 20 mg/dL (ref 6–23)
CALCIUM: 8.4 mg/dL (ref 8.4–10.5)
CO2: 30 mmol/L (ref 19–32)
CREATININE: 0.66 mg/dL (ref 0.50–1.35)
Chloride: 101 mmol/L (ref 96–112)
GFR calc Af Amer: >90 mL/min (ref >90)
GFR calc non Af Amer: >90 mL/min (ref >90)
Glucose, Bld: 240 mg/dL — ABNORMAL HIGH (ref 70–99)
Potassium: 3.9 mmol/L (ref 3.5–5.1)
SODIUM: 135 mmol/L (ref 135–145)

## 2014-04-21 LAB — GLUCOSE, CAPILLARY
GLUCOSE-CAPILLARY: 210 mg/dL — AB (ref 70–99)
Glucose-Capillary: 210 mg/dL — ABNORMAL HIGH (ref 70–99)
Glucose-Capillary: 205 mg/dL — ABNORMAL HIGH (ref 70–99)
Glucose-Capillary: 186 mg/dL — ABNORMAL HIGH (ref 70–99)

## 2014-04-21 LAB — MAGNESIUM: MAGNESIUM: 2 mg/dL (ref 1.5–2.5)

## 2014-04-21 NOTE — Progress Notes (Addendum)
[facility]       Patient Name: Aaron Thompson      SUBJECTIVE admited with inapprroprialte shocks for atrial fibrillation and rapid rate occurring in the context of a nonischemic cardiomyopathy for which she has undergone primary prevention ICD. Heart rates were faster than 200 bpm. He has a history of hypertension.  Hydrochlorothiazide has been discontinued  He remains on losartan. He has not been on anticoagulation  Past Medical History  Diagnosis Date  . Hypertension   . DJD (degenerative joint disease)   . Dermatophytosis of the body   . Esophageal reflux   . Other chronic nonalcoholic liver disease     fatty liver  . Unspecified hearing loss   . Anxiety state, unspecified   . Non-ischemic cardiomyopathy     a.  cath 6/11: OM2 30%; EF 30%;  b. echo 2/12: EF 30%, mild MR, mild LAE;  c.  Echo (11/14):  Mod LVH, EF 25-30%, diff HK, mod LAE.   [name] Atrial fibrillation     a. s/p DCCV 6/11; previously on Pradaxa;  b. Event Monitor 2012->No PAF;  c. 09/2011 s/p DCCV ->Xarelto started.  [name] LBBB (left bundle branch block)     intermittent  . Hx of colonic polyps   . Osteoarthrosis, unspecified whether generalized or localized, hand   . Diarrhea   . Gastroenteritis   . Abdominal pain, right upper quadrant   . Folliculitis   . Automatic implantable cardioverter-defibrillator in situ   . CHF (congestive heart failure)   . OSA on CPAP   . Type II diabetes mellitus   . Calculus of kidney 1981    "passed it on my own"    Scheduled Meds:  Scheduled Meds: . aspirin EC  81 mg Oral Daily  . carvedilol  37.5 mg Oral BID WC  . dofetilide  500 mcg Oral BID  . enoxaparin (LOVENOX) injection  60 mg Subcutaneous Q24H  . insulin aspart  0-5 Units Subcutaneous QHS  . insulin aspart  0-9 Units Subcutaneous TID WC  . losartan  50 mg Oral Daily  . pantoprazole  40 mg Oral Daily  . potassium chloride  40 mEq Oral Daily  . sodium chloride  3 mL Intravenous Q12H  . spironolactone  25 mg Oral  Daily   Continuous Infusions:  sodium chloride, acetaminophen, nitroGLYCERIN, ondansetron (ZOFRAN) IV, sodium chloride    PHYSICAL EXAM Filed Vitals:   04/21/14 0017 04/21/14 0400 04/21/14 0500 04/21/14 0833  BP: 117/71 105/73  106/60  Pulse: 80 73  79  Temp: 97.9 F (36.6 C) 97.9 F (36.6 C)    TempSrc: Oral Oral    Resp: 20 16    Height:      Weight:   273 lb 8 oz (124.059 kg)   SpO2: 98% 97%     Well developed and nourished in no acute distress HENT normal Neck supple with JVP-flat Clear Regular rate and rhythm, no murmurs or gallops Abd-soft with active BS No Clubbing cyanosis edema Skin-warm and dry A & Oriented  Grossly normal sensory and motor function\   TELEMETRY: Reviewed telemetry pt in *nsr   Intake/Output Summary (Last 24 hours) at 04/21/14 1027 Last data filed at 04/20/14 2049  Gross per 24 hour  Intake    480 ml  Output    475 ml  Net      5 ml    LABS: Basic Metabolic Panel:  Recent Labs Lab 04/18/14 2131 04/19/14 0225 04/19/14 9381 04/19/14 1215  04/19/14 1734 04/20/14 0315 04/21/14 0343  NA 138  --  135 134* 132* 136 135  K 3.7  --  3.6 3.8 5.0 3.9 3.9  CL 102  --  100 101 100 102 101  CO2 28  --  27 27 23 26 30   GLUCOSE 386*  --  230* 242* 237* 245* 240*  BUN 21  --  18 19 19 16 20   CREATININE 0.87 0.63 0.62 0.61 0.67 0.65 0.66  CALCIUM 9.5  --  8.9 9.0 8.8 8.8 8.4  MG 1.9  --   --   --   --  2.0 2.0   Cardiac Enzymes:  Recent Labs  04/19/14 0225 04/19/14 0633 04/19/14 1215  TROPONINI 0.22* 0.14* 0.10*   CBC:  Recent Labs Lab 04/18/14 2131 04/19/14 0225 04/19/14 0633  WBC 7.1 7.5 7.2  NEUTROABS 4.6  --   --   HGB 15.7 15.2 15.3  HCT 45.6 43.6 44.2  MCV 84.6 85.0 84.5  PLT 176 164 150   PROTIME: No results for input(s): LABPROT, INR in the last 72 hours. Liver Function Tests: No results for input(s): AST, ALT, ALKPHOS, BILITOT, PROT, ALBUMIN in the last 72 hours. No results for input(s): LIPASE, AMYLASE in  the last 72 hours. BNP: BNP (last 3 results) No results for input(s): BNP in the last 8760 hours.  ProBNP (last 3 results) No results for input(s): PROBNP in the last 8760 hours.  D-Dimer: No results for input(s): DDIMER in the last 72 hours. Hemoglobin A1C:  Recent Labs  04/19/14 0633  HGBA1C 10.6*   Fasting Lipid Panel: No results for input(s): CHOL, HDL, LDLCALC, TRIG, CHOLHDL, LDLDIRECT in the last 72 hours. Thyroid Function Tests:  Recent Labs  04/19/14 0225  TSH 1.227   Anemia Panel: No results for input(s): VITAMINB12, FOLATE, FERRITIN, TIBC, IRON, RETICCTPCT in the last 72 hours.   ECG  QTc 496  Device Interrogation  Reviewed willneed reprogramming  ASSESSMENT AND PLAN:  Principal Problem:   ICD (implantable cardioverter-defibrillator) discharge Active Problems:   Nonischemic cardiomyopathy   Atrial fibrillation with RVR   Low serum potassium level  Add metop succinate for augmented rate control Continue dofetilide Will need to review work situation Reprogram device in am Continue K repletion    Signed, Virl Axe MD  04/21/2014

## 2014-04-22 ENCOUNTER — Encounter (HOSPITAL_COMMUNITY): Payer: Self-pay | Admitting: Physician Assistant

## 2014-04-22 DIAGNOSIS — I4891 Unspecified atrial fibrillation: Secondary | ICD-10-CM

## 2014-04-22 DIAGNOSIS — I429 Cardiomyopathy, unspecified: Secondary | ICD-10-CM

## 2014-04-22 LAB — BASIC METABOLIC PANEL
Anion gap: 8 (ref 5–15)
BUN: 20 mg/dL (ref 6–23)
CALCIUM: 8.7 mg/dL (ref 8.4–10.5)
CO2: 27 mmol/L (ref 19–32)
CREATININE: 0.78 mg/dL (ref 0.50–1.35)
Chloride: 99 mmol/L (ref 96–112)
GFR calc Af Amer: >90 mL/min (ref >90)
GLUCOSE: 241 mg/dL — AB (ref 70–99)
POTASSIUM: 4 mmol/L (ref 3.5–5.1)
Sodium: 134 mmol/L — ABNORMAL LOW (ref 135–145)

## 2014-04-22 LAB — GLUCOSE, CAPILLARY
Glucose-Capillary: 219 mg/dL — ABNORMAL HIGH (ref 70–99)
Glucose-Capillary: 229 mg/dL — ABNORMAL HIGH (ref 70–99)

## 2014-04-22 LAB — MAGNESIUM: MAGNESIUM: 2 mg/dL (ref 1.5–2.5)

## 2014-04-22 MED ORDER — DOFETILIDE 500 MCG PO CAPS: 500.0000 ug | ORAL_CAPSULE | Freq: Two times a day (BID) | ORAL | Status: DC

## 2014-04-22 MED ORDER — RIVAROXABAN 20 MG PO TABS: 20.0000 mg | ORAL_TABLET | Freq: Every day | ORAL | Status: DC

## 2014-04-22 MED ORDER — POTASSIUM CHLORIDE CRYS ER 20 MEQ PO TBCR: 40.0000 meq | EXTENDED_RELEASE_TABLET | Freq: Every day | ORAL | Status: DC

## 2014-04-22 MED ORDER — LOSARTAN POTASSIUM 50 MG PO TABS: 50.0000 mg | ORAL_TABLET | Freq: Every day | ORAL | Status: DC

## 2014-04-22 MED ORDER — CARVEDILOL 25 MG PO TABS: 37.5000 mg | ORAL_TABLET | Freq: Two times a day (BID) | ORAL | Status: DC

## 2014-04-22 NOTE — Progress Notes (Signed)
[facility]       Patient Name: Aaron Thompson      SUBJECTIVE admited with inapprroprialte shocks for atrial fibrillation and rapid rate occurring in the context of a nonischemic cardiomyopathy for which she has undergone primary prevention ICD. Heart rates were faster than 200 bpm. He has a history of hypertension.  Hydrochlorothiazide has been discontinued  He remains on losartan. He has not been on anticoagulation  Past Medical History  Diagnosis Date  . Hypertension   . DJD (degenerative joint disease)   . Dermatophytosis of the body   . Esophageal reflux   . Other chronic nonalcoholic liver disease     fatty liver  . Unspecified hearing loss   . Anxiety state, unspecified   . Non-ischemic cardiomyopathy     a.  cath 6/11: OM2 30%; EF 30%;  b. echo 2/12: EF 30%, mild MR, mild LAE;  c.  Echo (11/14):  Mod LVH, EF 25-30%, diff HK, mod LAE.   [name] Atrial fibrillation     a. s/p DCCV 6/11; previously on Pradaxa;  b. Event Monitor 2012->No PAF;  c. 09/2011 s/p DCCV ->Xarelto started.  [name] LBBB (left bundle branch block)     intermittent  . Hx of colonic polyps   . Osteoarthrosis, unspecified whether generalized or localized, hand   . Diarrhea   . Gastroenteritis   . Abdominal pain, right upper quadrant   . Folliculitis   . Automatic implantable cardioverter-defibrillator in situ   . CHF (congestive heart failure)   . OSA on CPAP   . Type II diabetes mellitus   . Calculus of kidney 1981    "passed it on my own"    Scheduled Meds:  Scheduled Meds: . aspirin EC  81 mg Oral Daily  . carvedilol  37.5 mg Oral BID WC  . dofetilide  500 mcg Oral BID  . enoxaparin (LOVENOX) injection  60 mg Subcutaneous Q24H  . insulin aspart  0-5 Units Subcutaneous QHS  . insulin aspart  0-9 Units Subcutaneous TID WC  . losartan  50 mg Oral Daily  . pantoprazole  40 mg Oral Daily  . potassium chloride  40 mEq Oral Daily  . sodium chloride  3 mL Intravenous Q12H  . spironolactone  25 mg Oral  Daily   Continuous Infusions:  sodium chloride, acetaminophen, nitroGLYCERIN, ondansetron (ZOFRAN) IV, sodium chloride    PHYSICAL EXAM Filed Vitals:   04/21/14 1616 04/21/14 2042 04/22/14 0434 04/22/14 0749  BP: 114/79 120/83  125/80  Pulse: 78 81  83  Temp: 98.6 F (37 C) 98.5 F (36.9 C)    TempSrc: Oral Oral    Resp: 18 18    Height:      Weight:   272 lb 4.8 oz (123.514 kg)   SpO2: 98% 99%     Well developed and nourished in no acute distress HENT normal Neck supple with JVP-flat Clear Regular rate and rhythm, no murmurs or gallops Abd-soft with active BS No Clubbing cyanosis edema Skin-warm and dry A & Oriented  Grossly normal sensory and motor function\   TELEMETRY: Reviewed telemetry pt in *nsr   Intake/Output Summary (Last 24 hours) at 04/22/14 0800 Last data filed at 04/22/14 0434  Gross per 24 hour  Intake      0 ml  Output   2475 ml  Net  -2475 ml    LABS: Basic Metabolic Panel:  Recent Labs Lab 04/18/14 2131 04/19/14 0225 04/19/14 0633 04/19/14 1215 04/19/14 1734 04/20/14  0315 04/21/14 0343 04/22/14 0500  NA 138  --  135 134* 132* 136 135 134*  K 3.7  --  3.6 3.8 5.0 3.9 3.9 4.0  CL 102  --  100 101 100 102 101 99  CO2 28  --  27 27 23 26 30 27   GLUCOSE 386*  --  230* 242* 237* 245* 240* 241*  BUN 21  --  18 19 19 16 20 20   CREATININE 0.87 0.63 0.62 0.61 0.67 0.65 0.66 0.78  CALCIUM 9.5  --  8.9 9.0 8.8 8.8 8.4 8.7  MG 1.9  --   --   --   --  2.0 2.0 2.0   Cardiac Enzymes:  Recent Labs  04/19/14 1215  TROPONINI 0.10*   CBC:  Recent Labs Lab 04/18/14 2131 04/19/14 0225 04/19/14 0633  WBC 7.1 7.5 7.2  NEUTROABS 4.6  --   --   HGB 15.7 15.2 15.3  HCT 45.6 43.6 44.2  MCV 84.6 85.0 84.5  PLT 176 164 150    Hem ECG  QTc 530 Device Interrogation  Reviewed will need reprogramming  ASSESSMENT AND PLAN:  Principal Problem:   ICD (implantable cardioverter-defibrillator) discharge Active Problems:   Nonischemic  cardiomyopathy   Atrial fibrillation with RVR   Low serum potassium level  Add metop succinate for augmented rate control Continue dofetilide Will need to review work situation Reprogram device in am K repleted    Signed, Virl Axe MD  04/22/2014

## 2014-04-22 NOTE — Progress Notes (Addendum)
Inpatient Diabetes Program Recommendations  AACE/ADA: New Consensus Statement on Inpatient Glycemic Control (2013)  Target Ranges:  Prepandial:   less than 140 mg/dL      Peak postprandial:   less than 180 mg/dL (1-2 hours)      Critically ill patients:  140 - 180 mg/dL    Results for DELMER, Aaron Thompson (MRN [id number]) as of 04/22/2014 11:34  Ref. Range 04/19/2014 06:33  Hemoglobin A1C Latest Range: 4.8-5.6 % 10.6 (H)    Results for KINGSLEY, Aaron Thompson (MRN [id number]) as of 04/22/2014 11:34  Ref. Range 04/21/2014 07:56 04/21/2014 11:20 04/21/2014 16:48 04/21/2014 20:42  Glucose-Capillary Latest Range: 70-99 mg/dL 210 (H) 210 (H) 205 (H) 186 (H)    Results for ARTUR, [name] (MRN [id number]) as of 04/22/2014 11:34  Ref. Range 04/22/2014 07:33  Glucose-Capillary Latest Range: 70-99 mg/dL 219 (H)     Admit with: ICD fired  History: DM, HTN  Home DM Meds: None  Current DM Orders: Novolog Sensitive SSI tid ac + HS    **Glucose levels consistently elevated.  **Patient has had poor glucose control at home as evidenced by his elevated A1c of 10.6%.  Needs follow-up with his PCP: [name] MD [facility]) after d/c for DM management.     MD- Please consider adding low dose basal insulin while patient in hospital to help better control glucose levels- Lantus 12 units QHS (0.1 units/kg dosing)   Addendum 1600PM: Spoke with patient this afternoon about his elevated A1c.  Patient told me he was diagnosed about two years ago and was started on Metformin 500 mg bid by his PCP (Dr. Glori Bickers).  A1c at time of diagnosis was around 9% (per patient memory).  Patient stated he lost 40 pounds and was very careful with his diet and exercise.  Dr. Glori Bickers took patient off Metformin about 2 years ago when he got his A1c down to 6% range.  Discussed patient's current A1c of 10.6%.  Patient stated he has "fallen off the wagon" and knows he needs to do better at home.  Saw Dr. Glori Bickers about 6 months ago and  needs to make another appointment with PCP.  Encouraged patient to go see Dr. Glori Bickers asap after d/c and to ask Dr. Glori Bickers about restarting Metformin at home.  Patient agreeable.  Encouraged patient to check his CBGs at least once daily for the next few weeks and to keep a log book (check different times each day either before or after a meal).  Encouraged patient to take his CBG log book with him to all his MD appointments.     Will follow Wyn Quaker RN, MSN, CDE Diabetes Coordinator Inpatient Diabetes Program Team Pager: [phone] (8a-5p)

## 2014-04-22 NOTE — Discharge Summary (Signed)
Discharge Summary   Patient ID: Aaron Thompson MRN: [id number], DOB/AGE: 53-10-1960 53 y.o. Admit date: 04/18/2014 D/C date:     04/22/2014  Primary Care Provider: [name], MD Primary Cardiologist: [name], [place] Caryl Comes  Primary Discharge Diagnoses:  1. Atrial fibrillation with RVR resulting in inappropriate defibrillator shock, started on Tikosyn 2. Nonischemic cardiomyopathy/chronic systolic CHF 3. Sleep apnea, on CPAP 4. HTN 5. Obesity Body mass index is 33.16 kg/(m^2)., weight loss advised 6. Diabetes mellitus uncontrolled A1C 10.6  PMH:  Past Medical History  Diagnosis Date  . Hypertension   . DJD (degenerative joint disease)   . Dermatophytosis of the body   . Esophageal reflux   . Other chronic nonalcoholic liver disease     fatty liver  . Unspecified hearing loss   . Anxiety state, unspecified   . Non-ischemic cardiomyopathy     a.  cath 6/11: OM2 30%; EF 30%;  b. echo 2/12: EF 30%, mild MR, mild LAE;  c.  Echo (11/14):  Mod LVH, EF 25-30%, diff HK, mod LAE.   [name] Atrial fibrillation     a. s/p DCCV 6/11; previously on Pradaxa;  b. Event Monitor 2012->No PAF;  c. 09/2011 s/p DCCV ->Xarelto started. d. 03/2014: inappropriate ICD shocks for AF-RVR, started on Tikosyn, Xarelto restarted.  [name] LBBB (left bundle branch block)     intermittent  . Hx of colonic polyps   . Osteoarthrosis, unspecified whether generalized or localized, hand   . Diarrhea   . Gastroenteritis   . Abdominal pain, right upper quadrant   . Folliculitis   . Automatic implantable cardioverter-defibrillator in situ   . Chronic systolic CHF (congestive heart failure)   . OSA on CPAP   . Type II diabetes mellitus   . Calculus of kidney 1981    "passed it on my own"  . NSVT (nonsustained ventricular tachycardia)     Hospital Course: Mr. Fleer is a 54 y.o. male with a PMH significant for hypertension, non-ischemic cardiomyopathy (cath 06/2009 with minmal CAD), paroxysmal atrial fibrillation, chronic  systolic heart failure, sleep apnea (on CPAP), diabetes. He was working on 04/18/14 when he developed acute shortness of breath and received 4 ICD shocks. EMS was called and he was brought to [facility] for further evaluation. Device interrogation demonstrated inappropriate therapy for AF with RVR with ventricular rates >200. He converted to SR with ICD shocks. Device interrogation otherwise normal.He denied recent chest pain, shortness of breath, LE edema, recent fevers, chills, nausea or vomiting. He has had increased stress related to his job. Lab work on admission is notable for minimally elevated troponin, likely to due to AF with RVR and ICD shocks. Electrolytes normal. Last echo 05/2013 showed EF 35%, f/u echo 04/19/14 showed EF 20% with diffuse HK, grade 1 DD, mild MR, mild LAE, mildly dilated RV, mild-mod RV systolic dysfunction. EKG showed sinus rhythm with ventricular pacing, rate 78, QTCs 450-470s recently with IVCD. This was felt acceptable by the EP team to start Tikosyn. Losartan/HCTZ was changed to Losartan only in preparation for this. Digoxin was stopped and Coreg was increased to 37.5mg  BID. K was 3.8 thus he was started on potassium supplementation. The night of 04/19/14, several hours prior to first dose of Tikosyn he did have 6 beats of NSVT followed by PMT which terminated the rhythm. Dr. Ron Parker evaluated the patient and recommended to continue current plan which was to start Tikosyn 539mcg BID. The patient's electrolytes and telemetry remained stable thereafter. QTC was followed  and felt acceptable (in light of IVCD, and ICD) to continue. Today the patient feels well. Dr. Caryl Comes has seen and examined the patient today and feels he is stable for discharge. He was given 7 day RX as well as refills.  Note the patient was previously on Xarelto but discontinued this 04/2013 due to his preference. Given CHADSVASC score of 3 the patient was advised to restart this. Dr. Caryl Comes discussed this with him today  and he was agreeable. Aspirin was therefore discontinued. There are some ongoing issues about his work at [employer] that Dr. Caryl Comes plans to discuss at followup. ([employer] will not allow him to work on blood thinners.) We will keep the appointment previously scheduled on 04/25/14. Will defer further monitoring of Mg, K, QTc to Dr. Caryl Comes in follow-up.  Of note A1C this admission was 10.6. The patient says he previously got off his DM medications. He was advised that he should call PCP to discuss plan for uncontrolled diabetes.   Vital Signs. BP 123/79 mmHg  Pulse 77  Temp(Src) 97.8 F (36.6 C) (Oral)  Resp 16  Ht 6\' 4"  (1.93 m)  Wt 272 lb 4.8 oz (123.514 kg)  BMI 33.16 kg/m2  SpO2 99% General: Well developed, well nourished WM in no acute distress. Head: Normocephalic, atraumatic, sclera non-icteric, no xanthomas, nares are without discharge. Neck: JVP not elevated. Lungs: Clear bilaterally to auscultation without wheezes, rales, or rhonchi. Breathing is unlabored. Heart: RRR S1 S2 without murmurs, rubs, or gallops.  Abdomen: Soft, non-tender, non-distended with normoactive bowel sounds. No rebound/guarding. Extremities: No clubbing or cyanosis. No edema. Distal pedal pulses are 2+ and equal bilaterally. Neuro: Alert and oriented X 3. Moves all extremities spontaneously. Psych:  Responds to questions appropriately with a normal affect.   Discharge Vitals: Blood pressure 123/79, pulse 77, temperature 97.8 F (36.6 C), temperature source Oral, resp. rate 16, height 6\' 4"  (1.93 m), weight 272 lb 4.8 oz (123.514 kg), SpO2 99 %.  Labs: Lab Results  Component Value Date   WBC 7.2 04/19/2014   HGB 15.3 04/19/2014   HCT 44.2 04/19/2014   MCV 84.5 04/19/2014   PLT 150 04/19/2014    Recent Labs Lab 04/22/14 0500  NA 134*  K 4.0  CL 99  CO2 27  BUN 20  CREATININE 0.78  CALCIUM 8.7  GLUCOSE 241*    Lab Results  Component Value Date   CHOL 167 08/08/2013   HDL 33.70*  08/08/2013   LDLCALC 90 08/08/2013   TRIG 217.0* 08/08/2013   Lab Results  Component Value Date   DDIMER 0.33 08/18/2011    Diagnostic Studies/Procedures   Dg Chest Port 1 View  04/18/2014   CLINICAL DATA:  Felt defibrillator fire three times today. Initial encounter.  EXAM: PORTABLE CHEST - 1 VIEW  COMPARISON:  Chest radiograph performed 02/15/2013  FINDINGS: The lungs are well-aerated. Mild bibasilar atelectasis or scarring is noted. There is no evidence of pleural effusion or pneumothorax.  The cardiomediastinal silhouette is normal in size. A pacemaker/AICD is noted overlying the left chest wall, with leads ending overlying the right atrium, right ventricle and coronary sinus. No acute osseous abnormalities are seen. Cervical spinal fusion hardware is noted.  IMPRESSION: Mild bibasilar atelectasis or scarring noted. Lungs otherwise clear.   Electronically Signed   By: Garald Balding M.D.   On: 04/18/2014 21:47    Discharge Medications   Current Discharge Medication List    START taking these medications   Details  dofetilide (TIKOSYN) 500 MCG capsule Take 1 capsule (500 mcg total) by mouth every 12 (twelve) hours. Qty: 60 capsule, Refills: 2    losartan (COZAAR) 50 MG tablet Take 1 tablet (50 mg total) by mouth daily. Qty: 30 tablet, Refills: 2    potassium chloride SA (K-DUR,KLOR-CON) 20 MEQ tablet Take 2 tablets (40 mEq total) by mouth daily. Qty: 60 tablet, Refills: 2    rivaroxaban (XARELTO) 20 MG TABS tablet Take 1 tablet (20 mg total) by mouth daily with supper. Qty: 30 tablet, Refills: 2      CONTINUE these medications which have CHANGED   Details  carvedilol (COREG) 25 MG tablet Take 1.5 tablets (37.5 mg total) by mouth 2 (two) times daily with a meal. Qty: 90 tablet, Refills: 2      CONTINUE these medications which have NOT CHANGED   Details  Multiple Vitamin (MULTIVITAMIN) tablet Take 1 tablet by mouth daily.      Omeprazole-Sodium Bicarbonate (ZEGERID OTC)  20-1100 MG CAPS Take 1 capsule by mouth daily.     spironolactone (ALDACTONE) 25 MG tablet TAKE 1 TABLET (25 MG TOTAL) BY MOUTH DAILY.     glucose blood (ONE TOUCH ULTRA TEST) test strip Check glucose twice daily and as needed, Dx. 250.00 uncontrolled DM       STOP taking these medications     aspirin EC 81 MG tablet      digoxin (LANOXIN) 0.25 MG tablet      losartan-hydrochlorothiazide (HYZAAR) 50-12.5 MG per tablet         Disposition   The patient will be discharged in stable condition to home. Discharge Instructions    Diet - low sodium heart healthy    Complete by:  As directed      Increase activity slowly    Complete by:  As directed   Please note several medication changes and new prescriptions. Please make sure what you are taking at home matches with our list.  New medications: Tikosyn, Potassium, Xarelto Medications that have changed: Your combination Losartan/hydrochlorothiazide was changed to Losartan only. Your Carvedilol was increased. Medications that were stopped: Aspirin, Digoxin, Losartan/hydrochlorothiazide.          Follow-up Information    Follow up with Virl Axe, MD.   Specialty:  Cardiology   Why:  04/25/14 at 3pm   Contact information:   [address] [address] [phone]         Duration of Discharge Encounter: Greater than 30 minutes including physician and PA time.  Signed, Melina Copa PA-C 04/22/2014, 3:30 PM

## 2014-04-23 ENCOUNTER — Telehealth: Payer: Self-pay

## 2014-04-23 MED ORDER — METFORMIN HCL 500 MG PO TABS: ORAL_TABLET | ORAL | Status: DC

## 2014-04-23 NOTE — Telephone Encounter (Signed)
Let's start metformin 500mg  daily while he gets in to see PCP. Start at 1 tablet daily for 4 days then increase to 500mg  twice daily with meals. Work on diabetic diet. Look at [place] website for further resources diabetes.org. Lab Results  Component Value Date   CREATININE 0.78 04/22/2014   Lab Results  Component Value Date   HGBA1C 10.6* 04/19/2014

## 2014-04-23 NOTE — Telephone Encounter (Signed)
No DPR found to release info to any one else; spoke with pt at [phone]; pt scheduled 30 min appt with Dr Glori Bickers on 05/01/14 at 12:15 PM. Pt will wait to hear if should restart metformin prior to appt.

## 2014-04-23 NOTE — Telephone Encounter (Signed)
Freda Munro pts wife left v/m; pt d/c from [facility] on 04/22/14 with dx A fib with RVR resulting in inappropriate defibrillator shocking; started on Tikosyn; blood sugar in 200 range; pt has not taken metformin in 1 year;Cardiologist, Dr Caryl Comes wants pt to restart metformin and advised pt to contact PCP. Pt has not been seen since 02/05/13. No future appt scheduled at [facility].Dr Glori Bickers out of office until 04/29/2014.Please advise.

## 2014-04-24 NOTE — Telephone Encounter (Signed)
Patient notified and verbalized understanding. 

## 2014-04-25 ENCOUNTER — Encounter: Payer: Self-pay | Admitting: *Deleted

## 2014-04-25 ENCOUNTER — Ambulatory Visit (INDEPENDENT_AMBULATORY_CARE_PROVIDER_SITE_OTHER): Payer: 59 | Admitting: Internal Medicine

## 2014-04-25 ENCOUNTER — Encounter: Payer: Self-pay | Admitting: Internal Medicine

## 2014-04-25 VITALS — BP 124/78 | HR 85 | Ht 76.0 in | Wt 281.0 lb

## 2014-04-25 DIAGNOSIS — Z4502 Encounter for adjustment and management of automatic implantable cardiac defibrillator: Secondary | ICD-10-CM | POA: Diagnosis not present

## 2014-04-25 DIAGNOSIS — Z0389 Encounter for observation for other suspected diseases and conditions ruled out: Secondary | ICD-10-CM | POA: Diagnosis not present

## 2014-04-25 DIAGNOSIS — I4891 Unspecified atrial fibrillation: Secondary | ICD-10-CM

## 2014-04-25 DIAGNOSIS — I429 Cardiomyopathy, unspecified: Secondary | ICD-10-CM

## 2014-04-25 DIAGNOSIS — I5022 Chronic systolic (congestive) heart failure: Secondary | ICD-10-CM

## 2014-04-25 DIAGNOSIS — E785 Hyperlipidemia, unspecified: Secondary | ICD-10-CM

## 2014-04-25 LAB — MDC_IDC_ENUM_SESS_TYPE_INCLINIC
Brady Statistic RA Percent Paced: 0.28 %
Date Time Interrogation Session: 20160331162951
HIGH POWER IMPEDANCE MEASURED VALUE: 80 Ohm
Implantable Pulse Generator Serial Number: 7133975
Lead Channel Impedance Value: 487.5 Ohm
Lead Channel Pacing Threshold Amplitude: 1 V
Lead Channel Pacing Threshold Pulse Width: 0.5 ms
Lead Channel Pacing Threshold Pulse Width: 0.5 ms
Lead Channel Sensing Intrinsic Amplitude: 5 mV
Lead Channel Setting Pacing Amplitude: 1.625
Lead Channel Setting Pacing Amplitude: 2 V
Lead Channel Setting Pacing Amplitude: 2.875
Lead Channel Setting Pacing Pulse Width: 0.5 ms
Lead Channel Setting Pacing Pulse Width: 0.5 ms
Lead Channel Setting Sensing Sensitivity: 0.5 mV
MDC IDC MSMT BATTERY REMAINING LONGEVITY: 56.4 mo
MDC IDC MSMT LEADCHNL LV IMPEDANCE VALUE: 1075 Ohm
MDC IDC MSMT LEADCHNL LV PACING THRESHOLD AMPLITUDE: 1.875 V
MDC IDC MSMT LEADCHNL LV PACING THRESHOLD PULSEWIDTH: 0.5 ms
MDC IDC MSMT LEADCHNL RA IMPEDANCE VALUE: 462.5 Ohm
MDC IDC MSMT LEADCHNL RA PACING THRESHOLD AMPLITUDE: 0.625 V
MDC IDC MSMT LEADCHNL RV SENSING INTR AMPL: 11.8 mV
MDC IDC SET ZONE DETECTION INTERVAL: 300 ms
MDC IDC STAT BRADY RV PERCENT PACED: 99.6 %
Zone Setting Detection Interval: 230 ms

## 2014-04-25 NOTE — Progress Notes (Signed)
Patient Care Team: Abner Greenspan, MD as PCP - General   HPI  Aaron Thompson is a 54 y.o. male Seen in followup for  ICD implanted for nonischemic cardiomyopathy and left bundle branch block.  He was noted on  his device to have atrial fibrillation and he was started on Rivaroxaban for stroke reduction prophylaxis   Chose to stop his anticoagulation and to return to work. March 2016 he had ICD shocks associated with very rapid atrial fibrillation.  He is now on dofetilide therapy as well as back on Rivaroxaban    Past Medical History  Diagnosis Date  . Hypertension   . DJD (degenerative joint disease)   . Dermatophytosis of the body   . Esophageal reflux   . Other chronic nonalcoholic liver disease     fatty liver  . Unspecified hearing loss   . Anxiety state, unspecified   . Non-ischemic cardiomyopathy     a.  cath 6/11: OM2 30%; EF 30%;  b. echo 2/12: EF 30%, mild MR, mild LAE;  c.  Echo (11/14):  Mod LVH, EF 25-30%, diff HK, mod LAE.   [name] PAF (paroxysmal atrial fibrillation)     a. s/p DCCV 6/11; previously on Pradaxa;  b. Event Monitor 2012->No PAF;  c. 09/2011 s/p DCCV ->Xarelto started. d. 03/2014: inappropriate ICD shocks for AF-RVR, started on Tikosyn, Xarelto restarted.  [name] LBBB (left bundle branch block)     intermittent  . Hx of colonic polyps   . Osteoarthrosis, unspecified whether generalized or localized, hand   . Diarrhea   . Gastroenteritis   . Abdominal pain, right upper quadrant   . Folliculitis   . Automatic implantable cardioverter-defibrillator in situ   . Chronic systolic CHF (congestive heart failure)   . OSA on CPAP   . Type II diabetes mellitus   . Calculus of kidney 1981    "passed it on my own"  . NSVT (nonsustained ventricular tachycardia)     a. 6 beats in 03/2014.    Past Surgical History  Procedure Laterality Date  . Neck surgery      plate/fusion  . Bilateral knee arthroscopy    . Cardioversion  06/2009    Non ischemic  cardiomyopathy - cath and echo  . Stress cardiolite  12/02/1998    Normal EF 52%  . Stress cardiolite  08/2005    Normal, EF 42%  . Cardiovascular stress test  2006    Neg per pt  . Upper gastrointestinal endoscopy  08/2006    GERD  . Abdominal US  01/2007    Fatty liver, no gallstones  . Doppler echocardiography  11/2009    Decreased EF of 35-40%  . Appendectomy    . Cardioversion  09/29/2011    Procedure: CARDIOVERSION;  Surgeon: Thayer Headings, MD;  Location: [facility];  Service: Cardiovascular;  Laterality: [name];  . Bi-ventricular implantable cardioverter defibrillator  (crt-d)  02/14/2013  . Left heart catheterization with coronary angiogram [name] 08/18/2011    Procedure: LEFT HEART CATHETERIZATION WITH CORONARY ANGIOGRAM;  Surgeon: Peter M Martinique, MD;  Location: [facility] CATH LAB;  Service: Cardiovascular;  Laterality: [name];  . Bi-ventricular implantable cardioverter defibrillator [name] 02/14/2013    Procedure: BI-VENTRICULAR IMPLANTABLE CARDIOVERTER DEFIBRILLATOR  (CRT-D);  Surgeon: Deboraha Sprang, MD;  Location: [facility] CATH LAB;  Service: Cardiovascular;  Laterality: [name];    Current Outpatient Prescriptions  Medication Sig Dispense Refill  . carvedilol (COREG) 25 MG tablet Take 1.5 tablets (  37.5 mg total) by mouth 2 (two) times daily with a meal. 90 tablet 2  . dofetilide (TIKOSYN) 500 MCG capsule Take 1 capsule (500 mcg total) by mouth every 12 (twelve) hours. 60 capsule 2  . glucose blood (ONE TOUCH ULTRA TEST) test strip Check glucose twice daily and as needed, Dx. 250.00 uncontrolled DM 100 each 3  . losartan (COZAAR) 50 MG tablet Take 1 tablet (50 mg total) by mouth daily. 30 tablet 2  . metFORMIN (GLUCOPHAGE) 500 MG tablet Take one tablet daily for 4 days then increase to twice daily with meals 60 tablet 1  . Multiple Vitamin (MULTIVITAMIN) tablet Take 1 tablet by mouth daily.      [name] Bicarbonate (ZEGERID OTC) 20-1100 MG CAPS Take 1 capsule by mouth daily.     . potassium  chloride SA (K-DUR,KLOR-CON) 20 MEQ tablet Take 2 tablets (40 mEq total) by mouth daily. 60 tablet 2  . rivaroxaban (XARELTO) 20 MG TABS tablet Take 1 tablet (20 mg total) by mouth daily with supper. 30 tablet 2  . spironolactone (ALDACTONE) 25 MG tablet TAKE 1 TABLET (25 MG TOTAL) BY MOUTH DAILY. 90 tablet 3   No current facility-administered medications for this visit.    No Known Allergies  Review of Systems negative except from HPI and PMH  Physical Exam BP 124/78 mmHg  Pulse 85  Ht 6\' 4"  (1.93 m)  Wt 281 lb (127.461 kg)  BMI 34.22 kg/m2 Well developed and well nourished in no acute distress HENT normal E scleral and icterus clear Neck Supple JVP flat; carotids brisk and full Clear to ausculation  Regular rate and rhythm, no murmurs gallops or rub Soft with active bowel sounds No clubbing cyanosis no Edema Alert and oriented, grossly normal motor and sensory function Skin Warm and Dry  ECG demonstrates sinus rhythm at 85 with intervals 15/14/44  Assessment and  Plan  Nonischemic cardiomyopathy  Congestive heart failure-chronic-systolic  Hypertension  Implantable defibrillator-CRT   Sleep disorder breathing  Atrial fibrillation  Inappropriate ICD discharges   The patient has atrial fibrillation and has high risk for thromboembolism and so it is appropriate that he be on anticoagulation. This may be issue with his work. He is also had inappropriate ICD discharges with atrial fibrillation. We have started him on antiarrhythmic therapy for now and concerned about him being at altitude because of the risks shock therapy.  We will write a letter is to half to the city for disability.  He is euvolemic.  He is fully compliant with her therapies.

## 2014-04-25 NOTE — Patient Instructions (Signed)
Your physician recommends that you continue on your current medications as directed. Please refer to the Current Medication list given to you today.  Your physician wants you to follow-up in: 3 months with Dr. Klein . You will receive a reminder letter in the mail two months in advance. If you don't receive a letter, please call our office to schedule the follow-up appointment.   

## 2014-04-29 ENCOUNTER — Other Ambulatory Visit: Payer: Self-pay | Admitting: Cardiovascular Disease

## 2014-05-01 ENCOUNTER — Other Ambulatory Visit: Payer: Self-pay | Admitting: Cardiovascular Disease

## 2014-05-01 ENCOUNTER — Ambulatory Visit (INDEPENDENT_AMBULATORY_CARE_PROVIDER_SITE_OTHER): Payer: 59 | Admitting: Family Medicine

## 2014-05-01 ENCOUNTER — Encounter: Payer: Self-pay | Admitting: Family Medicine

## 2014-05-01 VITALS — BP 118/70 | HR 80 | Temp 98.2°F | Resp 18 | Wt 281.0 lb

## 2014-05-01 DIAGNOSIS — E1165 Type 2 diabetes mellitus with hyperglycemia: Secondary | ICD-10-CM | POA: Diagnosis not present

## 2014-05-01 DIAGNOSIS — IMO0002 Reserved for concepts with insufficient information to code with codable children: Secondary | ICD-10-CM

## 2014-05-01 MED ORDER — METFORMIN HCL 1000 MG PO TABS: 1000.0000 mg | ORAL_TABLET | Freq: Two times a day (BID) | ORAL | Status: DC

## 2014-05-01 NOTE — Progress Notes (Signed)
Subjective:    Patient ID: Aaron Thompson, male    DOB: 1960/05/04, 55 y.o.   MRN: [id number]  HPI Here for f/u of DM2   Was lost to f/u after A1C of 10.5 in July Pt had prev declined med and was working on lifestyle change  Recently hosp for heart problems 3/24 -3/28 with rhythm disturbance and CHF   Noted glucose readings all in 200s  Lab Results  Component Value Date   HGBA1C 10.6* 04/19/2014   Wt is stable with bmi of 34  Now on metformin 500 bid -no side effects (or in the past)   Now doing better  Checking glucose at home - am is 169 to 201   Has been to DM teaching - it was a lot of help to him (both himself and wife)  Stuck with it for a while and then fell off  A1c had come down to 6 and stopped metformin - then got off track   Now back to watching carbs/diabetic diet ARB for renal protection   Not released for cardio yet - but is able to do some walking -that is going well so far  Walks with a neighbor No cp or palpitations  On xarelto - doing well with that now   BP: 118/70 mmHg  Pulse is 80   Highest wt 325 in the past - is down to 281 - he does succeed with weight loss   DM symptoms - urinates a lot (but on diuretic)- a little thirst Flu shot 11/15  Pneumonia vaccine 9/13  Due for an eye exam   Patient Active Problem List   Diagnosis Date Noted  . Low serum potassium level 04/21/2014  . ICD (implantable cardioverter-defibrillator) discharge 04/19/2014  . Atrial fibrillation with RVR 04/18/2014  . Chronic systolic heart failure 61/60/7371  . Hyperlipidemia 02/05/2013  . DM II (diabetes mellitus, type II), controlled 09/30/2011  . Nonischemic cardiomyopathy 08/18/2011  . NSVT (nonsustained ventricular tachycardia) 08/18/2011  . Chest pain on exertion 08/17/2011  . Unstable angina 08/17/2011  . Type II or unspecified type diabetes mellitus without mention of complication, not stated as uncontrolled 10/06/2010  . Palpitations 05/12/2010  .  CARDIOMYOPATHY OTHER DISEASES CLASSIFIED ELSW 07/16/2009  . COLONIC POLYPS 12/09/2008  . OSTEOARTHROSIS UNSPEC WHETHER GEN/LOCALIZED HAND 08/16/2008  . HYPERTENSION 05/11/2007  . History of renal calculi 05/11/2007  . FATTY LIVER DISEASE 03/20/2007  . ANXIETY 10/04/2006  . HEARING IMPAIRMENT 10/04/2006  . Atrial fibrillation 10/04/2006  . FOLLICULITIS 07/21/9483  . GERD 09/20/2006   Past Medical History  Diagnosis Date  . Hypertension   . DJD (degenerative joint disease)   . Dermatophytosis of the body   . Esophageal reflux   . Other chronic nonalcoholic liver disease     fatty liver  . Unspecified hearing loss   . Anxiety state, unspecified   . Non-ischemic cardiomyopathy     a.  cath 6/11: OM2 30%; EF 30%;  b. echo 2/12: EF 30%, mild MR, mild LAE;  c.  Echo (11/14):  Mod LVH, EF 25-30%, diff HK, mod LAE.   [name] PAF (paroxysmal atrial fibrillation)     a. s/p DCCV 6/11; previously on Pradaxa;  b. Event Monitor 2012->No PAF;  c. 09/2011 s/p DCCV ->Xarelto started. d. 03/2014: inappropriate ICD shocks for AF-RVR, started on Tikosyn, Xarelto restarted.  [name] LBBB (left bundle branch block)     intermittent  . Hx of colonic polyps   . Osteoarthrosis, unspecified whether generalized  or localized, hand   . Diarrhea   . Gastroenteritis   . Abdominal pain, right upper quadrant   . Folliculitis   . Automatic implantable cardioverter-defibrillator in situ   . Chronic systolic CHF (congestive heart failure)   . OSA on CPAP   . Type II diabetes mellitus   . Calculus of kidney 1981    "passed it on my own"  . NSVT (nonsustained ventricular tachycardia)     a. 6 beats in 03/2014.   Past Surgical History  Procedure Laterality Date  . Neck surgery      plate/fusion  . Bilateral knee arthroscopy    . Cardioversion  06/2009    Non ischemic cardiomyopathy - cath and echo  . Stress cardiolite  12/02/1998    Normal EF 52%  . Stress cardiolite  08/2005    Normal, EF 42%  . Cardiovascular  stress test  2006    Neg per pt  . Upper gastrointestinal endoscopy  08/2006    GERD  . Abdominal US  01/2007    Fatty liver, no gallstones  . Doppler echocardiography  11/2009    Decreased EF of 35-40%  . Appendectomy    . Cardioversion  09/29/2011    Procedure: CARDIOVERSION;  Surgeon: Thayer Headings, MD;  Location: [place];  Service: Cardiovascular;  Laterality: [name];  . Bi-ventricular implantable cardioverter defibrillator  (crt-d)  02/14/2013  . Left heart catheterization with coronary angiogram [name] 08/18/2011    Procedure: LEFT HEART CATHETERIZATION WITH CORONARY ANGIOGRAM;  Surgeon: Peter M Martinique, MD;  Location: [facility] CATH LAB;  Service: Cardiovascular;  Laterality: [name];  . Bi-ventricular implantable cardioverter defibrillator [name] 02/14/2013    Procedure: BI-VENTRICULAR IMPLANTABLE CARDIOVERTER DEFIBRILLATOR  (CRT-D);  Surgeon: Deboraha Sprang, MD;  Location: [facility] CATH LAB;  Service: Cardiovascular;  Laterality: [name];   History  Substance Use Topics  . Smoking status: Never Smoker   . Smokeless tobacco: Former Systems developer    Types: Snuff     Comment: 02/14/2013 "quit snuff in 1006"  . Alcohol Use: No   Family History  Problem Relation Age of Onset  . Hypertension Mother   . Diabetes Mother   . Hypertension Father   . Diabetes Father   . Diabetes Brother   . Kidney disease Brother   . Diabetes Brother   . Heart failure Mother     Died @ 50  . Heart failure Father     Died @ 83  . Other Sister     4 sisters A&W   No Known Allergies Current Outpatient Prescriptions on File Prior to Visit  Medication Sig Dispense Refill  . carvedilol (COREG) 25 MG tablet Take 1.5 tablets (37.5 mg total) by mouth 2 (two) times daily with a meal. 90 tablet 2  . dofetilide (TIKOSYN) 500 MCG capsule Take 1 capsule (500 mcg total) by mouth every 12 (twelve) hours. 60 capsule 2  . glucose blood (ONE TOUCH ULTRA TEST) test strip Check glucose twice daily and as needed, Dx. 250.00 uncontrolled DM 100 each 3  .  losartan (COZAAR) 50 MG tablet Take 1 tablet (50 mg total) by mouth daily. 30 tablet 2  . metFORMIN (GLUCOPHAGE) 500 MG tablet Take one tablet daily for 4 days then increase to twice daily with meals 60 tablet 1  . Multiple Vitamin (MULTIVITAMIN) tablet Take 1 tablet by mouth daily.      [name] Bicarbonate (ZEGERID OTC) 20-1100 MG CAPS Take 1 capsule by mouth daily.     [name]  potassium chloride SA (K-DUR,KLOR-CON) 20 MEQ tablet Take 2 tablets (40 mEq total) by mouth daily. 60 tablet 2  . rivaroxaban (XARELTO) 20 MG TABS tablet Take 1 tablet (20 mg total) by mouth daily with supper. 30 tablet 2  . spironolactone (ALDACTONE) 25 MG tablet TAKE 1 TABLET (25 MG TOTAL) BY MOUTH DAILY. 90 tablet 3   No current facility-administered medications on file prior to visit.      opth exam -will schedule- 6 mo overdue    Review of Systems Review of Systems  Constitutional: Negative for fever, appetite change, fatigue and unexpected weight change.  Eyes: Negative for pain and visual disturbance.  Respiratory: Negative for cough and shortness of breath.   Cardiovascular: Negative for cp or palpitations    Gastrointestinal: Negative for nausea, diarrhea and constipation.  Genitourinary: Negative for urgency and frequency. pos for some thirst  Skin: Negative for pallor or rash   Neurological: Negative for weakness, light-headedness, numbness and headaches.  Hematological: Negative for adenopathy. Does not bruise/bleed easily.  Psychiatric/Behavioral: Negative for dysphoric mood. The patient is not nervous/anxious.         Objective:   Physical Exam  Constitutional: He appears well-developed and well-nourished. No distress.  obese and well appearing - (has had a large wt loss in the past however)   HENT:  Head: Normocephalic and atraumatic.  Mouth/Throat: Oropharynx is clear and moist.  Eyes: Conjunctivae and EOM are normal. Pupils are equal, round, and reactive to light.  Neck: Normal  range of motion. Neck supple. No JVD present. Carotid bruit is not present. No thyromegaly present.  Cardiovascular: Normal rate, regular rhythm, normal heart sounds and intact distal pulses.  Exam reveals no gallop.   Pulmonary/Chest: Effort normal and breath sounds normal. No respiratory distress. He has no wheezes. He has no rales.  No crackles  Abdominal: Soft. Bowel sounds are normal. He exhibits no distension, no abdominal bruit and no mass. There is no tenderness.  Musculoskeletal: He exhibits no edema.  Lymphadenopathy:    He has no cervical adenopathy.  Neurological: He is alert. He has normal reflexes.  Skin: Skin is warm and dry. No rash noted.  Psychiatric: He has a normal mood and affect.          Assessment & Plan:   Problem List Items Addressed This Visit      Other   Diabetes type 2, uncontrolled - Primary    This has worsened  Lab Results  Component Value Date   HGBA1C 10.6* 04/19/2014  he is back on track with diet and exercise  Metformin 500 is starting to help- will inc to 1000 mg bid which he tolerated in the past  Enc further wt loss- has done well with that  Rev low glycemic diet On ARB Will go for his eye exam Check glucose bid  F/u 3 mo with lab prior         Relevant Medications   metFORMIN (GLUCOPHAGE) tablet

## 2014-05-01 NOTE — Patient Instructions (Signed)
Increase you metformin to 1000 mg am and 1000 mg pm  If low sugar or any other problems please let me know  Check your glucose am fasting and 2 hours after pm meal  Keep on track with diabetic diet  Exercise as tolerated when released to do so from cardiology Get your eye exam Follow up with me in 3 months with labs prior

## 2014-05-01 NOTE — Assessment & Plan Note (Signed)
This has worsened  Lab Results  Component Value Date   HGBA1C 10.6* 04/19/2014  he is back on track with diet and exercise  Metformin 500 is starting to help- will inc to 1000 mg bid which he tolerated in the past  Enc further wt loss- has done well with that  Rev low glycemic diet On ARB Will go for his eye exam Check glucose bid  F/u 3 mo with lab prior

## 2014-05-07 ENCOUNTER — Telehealth: Payer: Self-pay | Admitting: Internal Medicine

## 2014-05-07 NOTE — Telephone Encounter (Signed)
Walk-In Pt Form "FMLA Dropped Off" sent to [facility] for processing

## 2014-05-13 ENCOUNTER — Telehealth: Payer: Self-pay | Admitting: Internal Medicine

## 2014-05-13 NOTE — Telephone Encounter (Signed)
Walk-In pt Form " pt has questions" Sherri back Thursday will give to her then

## 2014-05-14 ENCOUNTER — Encounter: Payer: Self-pay | Admitting: Internal Medicine

## 2014-05-22 ENCOUNTER — Telehealth: Payer: Self-pay | Admitting: Internal Medicine

## 2014-05-22 NOTE — Telephone Encounter (Signed)
New message      Returning Aaron Thompson's call

## 2014-05-22 NOTE — Telephone Encounter (Signed)
Patient was checking on status of paperwork.  Informed patient that he just paid for paperwork on 4/22 and it would take 10-14 days to process before coming back to our office for Dr. Caryl Comes to sign.

## 2014-05-23 NOTE — Telephone Encounter (Signed)
Informed patient that his disability/FMLA paperwork is still being processed.  Informed him that he paid on 4/22 and it take 10-14 business days to be processed before coming to Dr. Caryl Comes for signatures.  Patient verbalized understanding.

## 2014-05-23 NOTE — Telephone Encounter (Signed)
F/u ° ° °Pt returning your call °

## 2014-06-07 ENCOUNTER — Telehealth: Payer: Self-pay | Admitting: Internal Medicine

## 2014-06-07 NOTE — Telephone Encounter (Signed)
New message    Patient calling was told to call back regarding disability form - it's has been 14 days now.   Patient states the nurse said she would take care of this for him.  He did not want a message to be taken or transfer to medical records.

## 2014-06-07 NOTE — Telephone Encounter (Signed)
Per Maudie Mercury in HIM--forms to be sent over from [facility] today for Dr Caryl Comes to review.   Pt advised that forms should be here for Dr Caryl Comes to review on Tuesday May 17 when Dr Caryl Comes is in [facility] [address]   I will forward to Dr Anastasia Pall.

## 2014-06-11 NOTE — Telephone Encounter (Signed)
Left detailed message on personal voicemail explaining that it may be Friday before Dr. Caryl Comes is able to address.

## 2014-06-13 ENCOUNTER — Other Ambulatory Visit: Payer: Self-pay | Admitting: Family Medicine

## 2014-06-13 NOTE — Telephone Encounter (Signed)
Pt left v/m requesting refill on one touch ultra test strips; advised pt already refilled to [facility]. Pt voiced understanding.

## 2014-06-14 ENCOUNTER — Telehealth: Payer: Self-pay | Admitting: Internal Medicine

## 2014-06-14 NOTE — Telephone Encounter (Signed)
Follow Up       Pt calling to find out if Dr. Caryl Comes will be signing his disability forms today so he can come pick them up. Please cal back and advise.

## 2014-06-14 NOTE — Telephone Encounter (Signed)
Left detailed message on personal voicemail that Dr. Caryl Comes will review the paperwork this evening at the end of clinic. Further explained that we received this paperwork only a few days ago and this is the first day Caryl Comes has been in the office to address.

## 2014-06-17 NOTE — Telephone Encounter (Signed)
Follow up    Patient calling back to check on paperwork.

## 2014-06-17 NOTE — Telephone Encounter (Signed)
Patient is aware paperwork ready for pick up.

## 2014-07-12 ENCOUNTER — Other Ambulatory Visit: Payer: Self-pay | Admitting: Physician Assistant

## 2014-07-15 ENCOUNTER — Other Ambulatory Visit: Payer: Self-pay | Admitting: Physician Assistant

## 2014-07-24 ENCOUNTER — Other Ambulatory Visit: Payer: Self-pay | Admitting: Physician Assistant

## 2014-07-24 ENCOUNTER — Other Ambulatory Visit (INDEPENDENT_AMBULATORY_CARE_PROVIDER_SITE_OTHER): Payer: 59

## 2014-07-24 DIAGNOSIS — E1165 Type 2 diabetes mellitus with hyperglycemia: Secondary | ICD-10-CM

## 2014-07-24 DIAGNOSIS — IMO0002 Reserved for concepts with insufficient information to code with codable children: Secondary | ICD-10-CM

## 2014-07-24 LAB — COMPREHENSIVE METABOLIC PANEL
ALK PHOS: 46 U/L (ref 39–117)
ALT: 24 U/L (ref 0–53)
AST: 21 U/L (ref 0–37)
Albumin: 4.2 g/dL (ref 3.5–5.2)
BUN: 23 mg/dL (ref 6–23)
CALCIUM: 9.4 mg/dL (ref 8.4–10.5)
CO2: 31 mEq/L (ref 19–32)
Chloride: 100 mEq/L (ref 96–112)
Creatinine, Ser: 0.83 mg/dL (ref 0.40–1.50)
GFR: 103.1 mL/min (ref >60.00)
Glucose, Bld: 217 mg/dL — ABNORMAL HIGH (ref 70–99)
Potassium: 4.2 mEq/L (ref 3.5–5.1)
Sodium: 138 mEq/L (ref 135–145)
Total Bilirubin: 0.6 mg/dL (ref 0.2–1.2)
Total Protein: 6.9 g/dL (ref 6.0–8.3)

## 2014-07-24 LAB — HEMOGLOBIN A1C: HEMOGLOBIN A1C: 7.7 % — AB (ref 4.6–6.5)

## 2014-07-24 LAB — LIPID PANEL
CHOLESTEROL: 176 mg/dL (ref 0–200)
HDL: 38.3 mg/dL — ABNORMAL LOW (ref >39.00)
LDL Cholesterol: 105 mg/dL — ABNORMAL HIGH (ref 0–99)
NONHDL: 137.7
Total CHOL/HDL Ratio: 5
Triglycerides: 164 mg/dL — ABNORMAL HIGH (ref 0.0–149.0)
VLDL: 32.8 mg/dL (ref 0.0–40.0)

## 2014-07-26 NOTE — Telephone Encounter (Signed)
Per note 3.31.16

## 2014-07-31 ENCOUNTER — Encounter: Payer: Self-pay | Admitting: Family Medicine

## 2014-07-31 ENCOUNTER — Ambulatory Visit (INDEPENDENT_AMBULATORY_CARE_PROVIDER_SITE_OTHER): Payer: 59 | Admitting: Family Medicine

## 2014-07-31 VITALS — BP 118/82 | HR 68 | Temp 97.0°F | Ht 76.0 in | Wt 285.0 lb

## 2014-07-31 DIAGNOSIS — E1165 Type 2 diabetes mellitus with hyperglycemia: Secondary | ICD-10-CM | POA: Diagnosis not present

## 2014-07-31 DIAGNOSIS — M19041 Primary osteoarthritis, right hand: Secondary | ICD-10-CM | POA: Insufficient documentation

## 2014-07-31 DIAGNOSIS — E785 Hyperlipidemia, unspecified: Secondary | ICD-10-CM

## 2014-07-31 DIAGNOSIS — M19042 Primary osteoarthritis, left hand: Secondary | ICD-10-CM | POA: Diagnosis not present

## 2014-07-31 DIAGNOSIS — IMO0002 Reserved for concepts with insufficient information to code with codable children: Secondary | ICD-10-CM

## 2014-07-31 MED ORDER — TRAMADOL HCL 50 MG PO TABS: 50.0000 mg | ORAL_TABLET | Freq: Three times a day (TID) | ORAL | Status: DC | PRN

## 2014-07-31 MED ORDER — METFORMIN HCL 500 MG PO TABS: 500.0000 mg | ORAL_TABLET | Freq: Two times a day (BID) | ORAL | Status: DC

## 2014-07-31 NOTE — Patient Instructions (Signed)
A1C is down to 7.6 - closer to goal Keep working on low sugar/low fat diet and weight loss with exercise as tolerated  Cholesterol is close to goal (Avoid red meat/ fried foods/ egg yolks/ fatty breakfast meats/ butter, cheese and high fat dairy/ and shellfish) Try tramadol for arthritis pain only when needed  Get your eye exam scheduled  Follow up in 3 months with labs prior

## 2014-07-31 NOTE — Progress Notes (Signed)
Pre visit review using our clinic review tool, if applicable. No additional management support is needed unless otherwise documented below in the visit note. 

## 2014-07-31 NOTE — Progress Notes (Signed)
Subjective:    Patient ID: Aaron Thompson, male    DOB: Jul 20, 1960, 54 y.o.   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 -cannot climb ladders  Stress level is lower and now time to take care of himself  Painting and taking care of 2 grand kids also   OA is worse - in fingers esp in am  Not a lot of imp with nsaid or tylenol  Tried his wife's tramadol and that helped more   Wt is up 4 lb with bmi of 34  Diabetes Home sugar results -am 150s-180, 2 hours pp about 130  DM diet - he is on a low carb diet (did it on his own)  Exercise - trying to walk more and also paints a lot (3 days per week)  -none  A1C last  Lab Results  Component Value Date   HGBA1C 7.7* 07/24/2014   This is a huge improvement from 10.6 Had to cut his metformin back to 500 due to diarrhea from metformin  Renal protection on ARB Last eye exam -has not had yet - will make that appt   Hyperlipidemia Lab Results  Component Value Date   CHOL 176 07/24/2014   CHOL 167 08/08/2013   CHOL 186 02/01/2013   Lab Results  Component Value Date   HDL 38.30* 07/24/2014   HDL 33.70* 08/08/2013   HDL 27.50* 02/01/2013   Lab Results  Component Value Date   LDLCALC 105* 07/24/2014   LDLCALC 90 08/08/2013   LDLCALC 79 08/18/2011   Lab Results  Component Value Date   TRIG 164.0* 07/24/2014   TRIG 217.0* 08/08/2013   TRIG 348.0* 02/01/2013   Lab Results  Component Value Date   CHOLHDL 5 07/24/2014   CHOLHDL 5 08/08/2013   CHOLHDL 7 02/01/2013   Lab Results  Component Value Date   LDLDIRECT 109.1 02/01/2013   has never been on a statin   bp is stable today  No cp or palpitations or headaches or edema  No side effects to medicines  BP Readings from Last 3 Encounters:  07/31/14 118/82  05/01/14 118/70  04/25/14 124/78        Patient Active Problem List   Diagnosis Date Noted  . Low serum potassium level 04/21/2014  . ICD  (implantable cardioverter-defibrillator) discharge 04/19/2014  . Atrial fibrillation with RVR 04/18/2014  . Chronic systolic heart failure 07/37/1062  . Hyperlipidemia 02/05/2013  . Diabetes type 2, uncontrolled 09/30/2011  . Nonischemic cardiomyopathy 08/18/2011  . NSVT (nonsustained ventricular tachycardia) 08/18/2011  . Chest pain on exertion 08/17/2011  . Unstable angina 08/17/2011  . Type II or unspecified type diabetes mellitus without mention of complication, not stated as uncontrolled 10/06/2010  . Palpitations 05/12/2010  . CARDIOMYOPATHY OTHER DISEASES CLASSIFIED ELSW 07/16/2009  . COLONIC POLYPS 12/09/2008  . OSTEOARTHROSIS UNSPEC WHETHER GEN/LOCALIZED HAND 08/16/2008  . HYPERTENSION 05/11/2007  . History of renal calculi 05/11/2007  . FATTY LIVER DISEASE 03/20/2007  . ANXIETY 10/04/2006  . HEARING IMPAIRMENT 10/04/2006  . Atrial fibrillation 10/04/2006  . FOLLICULITIS 69/48/5462  . GERD 09/20/2006   Past Medical History  Diagnosis Date  . Hypertension   . DJD (degenerative joint disease)   . Dermatophytosis of the body   . Esophageal reflux   . Other chronic nonalcoholic liver disease     fatty liver  . Unspecified hearing loss   . Anxiety state, unspecified   .  Non-ischemic cardiomyopathy     a.  cath 6/11: OM2 30%; EF 30%;  b. echo 2/12: EF 30%, mild MR, mild LAE;  c.  Echo (11/14):  Mod LVH, EF 25-30%, diff HK, mod LAE.   [name] PAF (paroxysmal atrial fibrillation)     a. s/p DCCV 6/11; previously on Pradaxa;  b. Event Monitor 2012->No PAF;  c. 09/2011 s/p DCCV ->Xarelto started. d. 03/2014: inappropriate ICD shocks for AF-RVR, started on Tikosyn, Xarelto restarted.  [name] LBBB (left bundle branch block)     intermittent  . Hx of colonic polyps   . Osteoarthrosis, unspecified whether generalized or localized, hand   . Diarrhea   . Gastroenteritis   . Abdominal pain, right upper quadrant   . Folliculitis   . Automatic implantable cardioverter-defibrillator in situ   .  Chronic systolic CHF (congestive heart failure)   . OSA on CPAP   . Type II diabetes mellitus   . Calculus of kidney 1981    "passed it on my own"  . NSVT (nonsustained ventricular tachycardia)     a. 6 beats in 03/2014.   Past Surgical History  Procedure Laterality Date  . Neck surgery      plate/fusion  . Bilateral knee arthroscopy    . Cardioversion  06/2009    Non ischemic cardiomyopathy - cath and echo  . Stress cardiolite  12/02/1998    Normal EF 52%  . Stress cardiolite  08/2005    Normal, EF 42%  . Cardiovascular stress test  2006    Neg per pt  . Upper gastrointestinal endoscopy  08/2006    GERD  . Abdominal US  01/2007    Fatty liver, no gallstones  . Doppler echocardiography  11/2009    Decreased EF of 35-40%  . Appendectomy    . Cardioversion  09/29/2011    Procedure: CARDIOVERSION;  Surgeon: Thayer Headings, MD;  Location: [place];  Service: Cardiovascular;  Laterality: [name];  . Bi-ventricular implantable cardioverter defibrillator  (crt-d)  02/14/2013  . Left heart catheterization with coronary angiogram [name] 08/18/2011    Procedure: LEFT HEART CATHETERIZATION WITH CORONARY ANGIOGRAM;  Surgeon: Peter M Martinique, MD;  Location: [facility] CATH LAB;  Service: Cardiovascular;  Laterality: [name];  . Bi-ventricular implantable cardioverter defibrillator [name] 02/14/2013    Procedure: BI-VENTRICULAR IMPLANTABLE CARDIOVERTER DEFIBRILLATOR  (CRT-D);  Surgeon: Deboraha Sprang, MD;  Location: [facility] CATH LAB;  Service: Cardiovascular;  Laterality: [name];   History  Substance Use Topics  . Smoking status: Never Smoker   . Smokeless tobacco: Former Systems developer    Types: Snuff     Comment: 02/14/2013 "quit snuff in 1006"  . Alcohol Use: No   Family History  Problem Relation Age of Onset  . Hypertension Mother   . Diabetes Mother   . Hypertension Father   . Diabetes Father   . Diabetes Brother   . Kidney disease Brother   . Diabetes Brother   . Heart failure Mother     Died @ 29  . Heart failure  Father     Died @ 26  . Other Sister     4 sisters A&W   No Known Allergies Current Outpatient Prescriptions on File Prior to Visit  Medication Sig Dispense Refill  . carvedilol (COREG) 25 MG tablet TAKE 1 AND 1/2 TABLETS (37.5 MG TOTAL) BY MOUTH 2 (TWO) TIMES DAILY WITH A MEAL. 90 tablet 6  . glucose blood (ONE TOUCH ULTRA TEST) test strip CHECK GLUCOSE TWICE DAILY AND  AS NEEDED, DX. E11.65 (UNCONTROLLED DM) 100 each 0  . KLOR-CON M20 20 MEQ tablet TAKE 2 TABLETS (40 MEQ TOTAL) BY MOUTH DAILY. 60 tablet 0  . losartan (COZAAR) 50 MG tablet TAKE 1 TABLET BY MOUTH EVERY DAY 30 tablet 6  . metFORMIN (GLUCOPHAGE) 1000 MG tablet Take 1 tablet (1,000 mg total) by mouth 2 (two) times daily with a meal. 30 tablet 11  . Multiple Vitamin (MULTIVITAMIN) tablet Take 1 tablet by mouth daily.      [name] Bicarbonate (ZEGERID OTC) 20-1100 MG CAPS Take 1 capsule by mouth daily.     . rivaroxaban (XARELTO) 20 MG TABS tablet Take 1 tablet (20 mg total) by mouth daily with supper. 30 tablet 2  . spironolactone (ALDACTONE) 25 MG tablet TAKE 1 TABLET (25 MG TOTAL) BY MOUTH DAILY. 90 tablet 3  . TIKOSYN 500 MCG capsule TAKE 1 CAPSULE (500 MCG TOTAL) BY MOUTH EVERY 12 (TWELVE) HOURS. 60 capsule 0   No current facility-administered medications on file prior to visit.      Review of Systems Review of Systems  Constitutional: Negative for fever, appetite change, fatigue and unexpected weight change.  Eyes: Negative for pain and visual disturbance.  Respiratory: Negative for cough and shortness of breath.   Cardiovascular: Negative for cp or palpitations    Gastrointestinal: Negative for nausea, diarrhea and constipation.  Genitourinary: Negative for urgency and frequency.  Skin: Negative for pallor or rash  MSK pos for hand pain and stiffness worse in the am   Neurological: Negative for weakness, light-headedness, numbness and headaches.  Hematological: Negative for adenopathy. Does not  bruise/bleed easily.  Psychiatric/Behavioral: Negative for dysphoric mood. The patient is not nervous/anxious.         Objective:   Physical Exam  Constitutional: He appears well-developed and well-nourished. No distress.  obese and well appearing   HENT:  Head: Normocephalic and atraumatic.  Mouth/Throat: Oropharynx is clear and moist.  Eyes: Conjunctivae and EOM are normal. Pupils are equal, round, and reactive to light.  Neck: Normal range of motion. Neck supple. No JVD present. Carotid bruit is not present. No thyromegaly present.  Cardiovascular: Normal rate, normal heart sounds and intact distal pulses.  Exam reveals no gallop.   Pulmonary/Chest: Effort normal and breath sounds normal. No respiratory distress. He has no wheezes. He has no rales.  No crackles  Abdominal: Soft. Bowel sounds are normal. He exhibits no distension, no abdominal bruit and no mass. There is no tenderness.  Musculoskeletal: He exhibits no edema.  Some enl of distal PIP joints with Herberdens nodes on R 5th finger Nl rom fingers   Lymphadenopathy:    He has no cervical adenopathy.  Neurological: He is alert. He has normal reflexes.  Skin: Skin is warm and dry. No rash noted.  Psychiatric: He has a normal mood and affect.          Assessment & Plan:   Problem List Items Addressed This Visit    Diabetes type 2, uncontrolled - Primary    Lab Results  Component Value Date   HGBA1C 7.7* 07/24/2014   Large improvement with metformin and diet  Enc to keep working on it  Re check 3 mo  No complications      Relevant Medications   metFORMIN (GLUCOPHAGE) 500 MG tablet   Hyperlipidemia    Almost at goal Goal is LDL under 100 Pt will work harder on diet  If not imp in 3 mo -consider statin  Rev low sat fat diet in detail       Osteoarthritis of both hands    Wrote px for tramadol for prn use (only when pain is mod to severe)  Failed nsaid and tylenol  Disc need to warm hands up in the am         Relevant Medications   traMADol (ULTRAM) 50 MG tablet

## 2014-08-01 NOTE — Assessment & Plan Note (Signed)
Almost at goal Goal is LDL under 100 Pt will work harder on diet  If not imp in 3 mo -consider statin  Rev low sat fat diet in detail

## 2014-08-01 NOTE — Assessment & Plan Note (Signed)
Wrote px for tramadol for prn use (only when pain is mod to severe)  Failed nsaid and tylenol  Disc need to warm hands up in the am

## 2014-08-01 NOTE — Assessment & Plan Note (Signed)
Lab Results  Component Value Date   HGBA1C 7.7* 07/24/2014   Large improvement with metformin and diet  Enc to keep working on it  Re check 3 mo  No complications

## 2014-08-06 ENCOUNTER — Telehealth: Payer: Self-pay | Admitting: Family Medicine

## 2014-08-06 MED ORDER — PROMETHAZINE HCL 25 MG PO TABS: 25.0000 mg | ORAL_TABLET | Freq: Three times a day (TID) | ORAL | Status: DC | PRN

## 2014-08-06 NOTE — Telephone Encounter (Signed)
Please try to reach him to check in  ? If abd pain/ blood in stool/black stool/ s or s of dehydration  Thanks

## 2014-08-06 NOTE — Telephone Encounter (Signed)
PLEASE NOTE: All timestamps contained within this report are represented as [place] Standard Time. CONFIDENTIALTY NOTICE: This fax transmission is intended only for the addressee. It contains information that is legally privileged, confidential or otherwise protected from use or disclosure. If you are not the intended recipient, you are strictly prohibited from reviewing, disclosing, copying using or disseminating any of this information or taking any action in reliance on or regarding this information. If you have received this fax in error, please notify us immediately by telephone so that we can arrange for its return to [place]. Phone: [phone], Toll-Free: [phone], Fax: [phone] Page: 1 of 1 Call Id: [id number] Long Patient Name: Aaron Thompson Gender: Male DOB: June 27, 1960 Age: 54 Y 9 M 18 D Return Phone Number: [phone] (Primary), [phone] (Secondary) Address[place] [address] Client [facility] - Client Client Site [place], [place] Contact Type Call Call Type Triage / Clinical Relationship To Patient Self Appointment Disposition EMR Caller Not Reached Info pasted into Epic Yes Return Phone Number [phone] (Secondary) Chief Complaint Vomiting Initial Comment Caller states having diarrhea and vomiting, dry heaving now Nurse Assessment Guidelines Guideline Title Affirmed Question Affirmed Notes Nurse Date/Time (Eastern Time) Disp. Time Eilene Ghazi Time) Disposition Final User 08/06/2014 9:13:36 AM Attempt made - message left Marcelline Deist, RN, Kermit Balo 08/06/2014 9:24:02 AM Attempt made - no message left Harlon Ditty 08/06/2014 10:47:13 AM FINAL ATTEMPT MADE - no message left Yes Marcelline Deist, RN, Kermit Balo After Care Instructions Given Call Event Type User Date / Time Description

## 2014-08-06 NOTE — Telephone Encounter (Signed)
Patient Name: Aaron Thompson DOB: Jun 16, 1960 Initial Comment Caller states having diarrhea and vomiting, dry heaving now Nurse Assessment Guidelines Guideline Title Affirmed Question Affirmed Notes Final Disposition User FINAL ATTEMPT MADE - no message left [name], Therapist, sports, [employer]

## 2014-08-06 NOTE — Telephone Encounter (Signed)
They had pt's phone # wrong in Triage note. Wife advise me that pt doesn't have abd pain, no blood in stool or black stool just diarrhea, and has no sxs of dehydration, no light headed, dizzy or confusion. Pt's wife is worried because she said since is has been vomiting all day that can cause him to go to a fib so she is requesting that we send in phenergan to [facility] asap, please advise

## 2014-08-06 NOTE — Telephone Encounter (Signed)
Pt notified Rx sent to pharmacy and I advise him of Dr. Marliss Coots instructions/recommendations and verbalized understanding

## 2014-08-06 NOTE — Telephone Encounter (Signed)
Will send in phenergan If new symptoms or worse-go to ED /will need fluids  Warn that this will sedate

## 2014-08-12 ENCOUNTER — Other Ambulatory Visit: Payer: Self-pay | Admitting: Internal Medicine

## 2014-08-30 ENCOUNTER — Other Ambulatory Visit: Payer: Self-pay | Admitting: *Deleted

## 2014-09-01 ENCOUNTER — Encounter: Payer: Self-pay | Admitting: Nurse Practitioner

## 2014-09-01 NOTE — Progress Notes (Signed)
Electrophysiology Office Note Date: 09/02/2014  ID:  Aaron Thompson, DOB Jun 03, 1960, MRN [id number]  PCP: [name], MD Electrophysiologist: Caryl Comes  CC: Routine ICD follow-up  Aaron Thompson is a 54 y.o. male seen today for [place].  He presents today for routine electrophysiology followup.  Since last being seen in our clinic, the patient reports doing very well. He denies chest pain, palpitations, dyspnea, PND, orthopnea, nausea, vomiting, dizziness, syncope, edema, weight gain, or early satiety.  He has not had ICD shocks. He is remaining very active without chest pain or shortness of breath.   Device History: STJ CRTD implanted 2015 for NICM, LBBB History of appropriate therapy: no, previous inappropriate therapy for PAF History of AAD therapy: yes - Tikosyn for AF   Past Medical History  Diagnosis Date  . Hypertension   . DJD (degenerative joint disease)   . Dermatophytosis of the body   . Esophageal reflux   . Other chronic nonalcoholic liver disease     fatty liver  . Unspecified hearing loss   . Anxiety state, unspecified   . Non-ischemic cardiomyopathy     a.  cath 6/11: OM2 30%; EF 30%;  b. echo 2/12: EF 30%, mild MR, mild LAE;  c.  Echo (11/14):  Mod LVH, EF 25-30%, diff HK, mod LAE.   [name] PAF (paroxysmal atrial fibrillation)     a. s/p DCCV 6/11; previously on Pradaxa;  b. Event Monitor 2012->No PAF;  c. 09/2011 s/p DCCV ->Xarelto started. d. 03/2014: inappropriate ICD shocks for AF-RVR, started on Tikosyn, Xarelto restarted.  [name] LBBB (left bundle branch block)     intermittent  . Hx of colonic polyps   . Osteoarthrosis, unspecified whether generalized or localized, hand   . Chronic systolic CHF (congestive heart failure)   . OSA on CPAP   . Type II diabetes mellitus   . Calculus of kidney 1981    "passed it on my own"   Past Surgical History  Procedure Laterality Date  . Neck surgery      plate/fusion  . Bilateral knee arthroscopy    . Stress cardiolite   08/2005    Normal, EF 42%  . Upper gastrointestinal endoscopy  08/2006    GERD  . Abdominal US  01/2007    Fatty liver, no gallstones  . Doppler echocardiography  11/2009    Decreased EF of 35-40%  . Appendectomy    . Cardioversion  09/29/2011    Procedure: CARDIOVERSION;  Surgeon: Thayer Headings, MD;  Location: [place];  Service: Cardiovascular;  Laterality: [name];  . Left heart catheterization with coronary angiogram [name] 08/18/2011    Procedure: LEFT HEART CATHETERIZATION WITH CORONARY ANGIOGRAM;  Surgeon: Peter M Martinique, MD;  Location: [facility] CATH LAB;  Service: Cardiovascular;  Laterality: [name];  . Bi-ventricular implantable cardioverter defibrillator [name] 02/14/2013    STJ CRTD implanted by Dr Caryl Comes    Current Outpatient Prescriptions  Medication Sig Dispense Refill  . carvedilol (COREG) 25 MG tablet TAKE 1 AND 1/2 TABLETS (37.5 MG TOTAL) BY MOUTH 2 (TWO) TIMES DAILY WITH A MEAL. 90 tablet 6  . dofetilide (TIKOSYN) 500 MCG capsule Take 1 capsule (500 mcg total) by mouth 2 (two) times daily. 60 capsule 6  . glucose blood (ONE TOUCH ULTRA TEST) test strip CHECK GLUCOSE TWICE DAILY AND AS NEEDED, DX. E11.65 (UNCONTROLLED DM) 100 each 0  . KLOR-CON M20 20 MEQ tablet TAKE 2 TABLETS (40 MEQ TOTAL) BY MOUTH DAILY. 60 tablet 0  .  losartan (COZAAR) 50 MG tablet TAKE 1 TABLET BY MOUTH EVERY DAY 30 tablet 6  . metFORMIN (GLUCOPHAGE) 500 MG tablet Take 1 tablet (500 mg total) by mouth 2 (two) times daily with a meal. 60 tablet 11  . Multiple Vitamin (MULTIVITAMIN) tablet Take 1 tablet by mouth daily.      [name] Bicarbonate (ZEGERID OTC) 20-1100 MG CAPS Take 1 capsule by mouth daily.     . promethazine (PHENERGAN) 25 MG tablet Take 1 tablet (25 mg total) by mouth every 8 (eight) hours as needed for nausea or vomiting. 20 tablet 0  . rivaroxaban (XARELTO) 20 MG TABS tablet Take 1 tablet (20 mg total) by mouth daily with supper. 30 tablet 2  . spironolactone (ALDACTONE) 25 MG tablet Take 1 tablet  (25 mg total) by mouth daily. 30 tablet 6  . traMADol (ULTRAM) 50 MG tablet Take 1 tablet (50 mg total) by mouth every 8 (eight) hours as needed for moderate pain or severe pain. 30 tablet 0   No current facility-administered medications for this visit.    Allergies:   Review of patient's allergies indicates no known allergies.   Social History: History   Social History  . Marital Status: Married    Spouse Name: [name]  . Number of Children: 2  . Years of Education: [name]   Occupational History  . PAINTER Unemployed   Social History Main Topics  . Smoking status: Never Smoker   . Smokeless tobacco: Former Systems developer    Types: Snuff     Comment: 02/14/2013 "quit snuff in 1006"  . Alcohol Use: No  . Drug Use: No  . Sexual Activity: Yes   Other Topics Concern  . Not on file   Social History Narrative   Lives in [place] with wife.  Works @ [employer] in [employer].  Not routinely exercising.    Family History: Family History  Problem Relation Age of Onset  . Hypertension Mother   . Diabetes Mother   . Hypertension Father   . Diabetes Father   . Diabetes Brother   . Kidney disease Brother   . Diabetes Brother   . Heart failure Mother     Died @ 3  . Heart failure Father     Died @ 45  . Other Sister     4 sisters A&W    Review of Systems: All other systems reviewed and are otherwise negative except as noted above.   Physical Exam: VS:  BP 120/78 mmHg  Pulse 78  Ht 6\' 4"  (1.93 m)  Wt 279 lb 9.6 oz (126.826 kg)  BMI 34.05 kg/m2 , BMI Body mass index is 34.05 kg/(m^2).  GEN- The patient is well appearing, alert and oriented x 3 today.   HEENT: normocephalic, atraumatic; sclera clear, conjunctiva pink; hearing intact; oropharynx clear; neck supple  Lungs- Clear to ausculation bilaterally, normal work of breathing.  No wheezes, rales, rhonchi Heart- Regular rate and rhythm (paced) GI- soft, non-tender, non-distended, bowel sounds present  Extremities- no  clubbing, cyanosis, or edema; DP/PT/radial pulses 2+ bilaterally MS- no significant deformity or atrophy Skin- warm and dry, no rash or lesion; ICD pocket well healed Psych- euthymic mood, full affect Neuro- strength and sensation are intact  ICD interrogation- reviewed in detail today,  See PACEART report  EKG:  EKG is not ordered today  Recent Labs: 04/19/2014: Hemoglobin 15.3; Platelets 150; TSH 1.227 04/22/2014: Magnesium 2.0 07/24/2014: ALT 24; BUN 23; Creatinine, Ser 0.83; Potassium 4.2; Sodium 138  Wt Readings from Last 3 Encounters:  09/02/14 279 lb 9.6 oz (126.826 kg)  07/31/14 285 lb (129.275 kg)  05/01/14 281 lb (127.461 kg)     Other studies Reviewed: Additional studies/ records that were reviewed today include: Dr Olin Pia notes  Assessment and Plan:  1.  Chronic systolic dysfunction euvolemic today Stable on an appropriate medical regimen Normal ICD function See Pace Art report No changes today  2.  Paroxysmal atrial fibrillation Maintaining SR on Tikosyn Continue Xarelto for CHADS2VASC of at least 3 BMET, Mg today  3.  HTN Stable No change required today   Current medicines are reviewed at length with the patient today.   The patient does not have concerns regarding his medicines.  The following changes were made today:  none  Labs/ tests ordered today include: BMET, Mg   Disposition:   Follow up with [facility] transmissions, Dr Caryl Comes in 1 year   Signed, [name], NP 09/02/2014 10:43 AM  [facility] [address] [place] [address] [phone] (office) [phone] (fax)

## 2014-09-02 ENCOUNTER — Ambulatory Visit (INDEPENDENT_AMBULATORY_CARE_PROVIDER_SITE_OTHER): Payer: 59 | Admitting: Nurse Practitioner

## 2014-09-02 ENCOUNTER — Encounter: Payer: Self-pay | Admitting: Nurse Practitioner

## 2014-09-02 VITALS — BP 120/78 | HR 78 | Ht 76.0 in | Wt 279.6 lb

## 2014-09-02 DIAGNOSIS — I10 Essential (primary) hypertension: Secondary | ICD-10-CM | POA: Diagnosis not present

## 2014-09-02 DIAGNOSIS — I480 Paroxysmal atrial fibrillation: Secondary | ICD-10-CM | POA: Diagnosis not present

## 2014-09-02 DIAGNOSIS — I519 Heart disease, unspecified: Secondary | ICD-10-CM | POA: Diagnosis not present

## 2014-09-02 LAB — BASIC METABOLIC PANEL
BUN: 17 mg/dL (ref 6–23)
CHLORIDE: 101 meq/L (ref 96–112)
CO2: 27 mEq/L (ref 19–32)
Calcium: 9.2 mg/dL (ref 8.4–10.5)
Creatinine, Ser: 0.76 mg/dL (ref 0.40–1.50)
GFR: 114.09 mL/min (ref >60.00)
Glucose, Bld: 211 mg/dL — ABNORMAL HIGH (ref 70–99)
Potassium: 4.1 mEq/L (ref 3.5–5.1)
SODIUM: 137 meq/L (ref 135–145)

## 2014-09-02 LAB — MAGNESIUM: Magnesium: 2.1 mg/dL (ref 1.5–2.5)

## 2014-09-02 MED ORDER — SPIRONOLACTONE 25 MG PO TABS: 25.0000 mg | ORAL_TABLET | Freq: Every day | ORAL | Status: DC

## 2014-09-02 MED ORDER — DOFETILIDE 500 MCG PO CAPS: 500.0000 ug | ORAL_CAPSULE | Freq: Two times a day (BID) | ORAL | Status: DC

## 2014-09-02 NOTE — Patient Instructions (Signed)
Medication Instructions:   Your physician recommends that you continue on your current medications as directed. Please refer to the Current Medication list given to you today.    Labwork:  BMET AND MAG    Testing/Procedures  NONE ORDER TODAY  Follow-Up:   Remote monitoring is used to monitor your Pacemaker of ICD from home. This monitoring reduces the number of office visits required to check your device to one time per year. It allows [place] to keep an eye on the functioning of your device to ensure it is working properly. You are scheduled for a device check from home on .  11  / 7   / 16       You may send your transmission at any time that day. If you have a wireless device, the transmission will be sent automatically. After your physician reviews your transmission, you will receive a postcard with your next transmission date.   Your physician wants you to follow-up in: [place] will receive a reminder letter in the mail two months in advance. If you don't receive a letter, please call our office to schedule the follow-up appointment.   Any Other Special Instructions Will Be Listed Below (If Applicable).

## 2014-09-05 LAB — CUP PACEART INCLINIC DEVICE CHECK
Date Time Interrogation Session: 20160811143630
Lead Channel Setting Pacing Amplitude: 1.625
Lead Channel Setting Pacing Amplitude: 2.875
Lead Channel Setting Pacing Pulse Width: 0.5 ms
Lead Channel Setting Pacing Pulse Width: 0.5 ms
MDC IDC SET LEADCHNL RV PACING AMPLITUDE: 2 V
MDC IDC SET LEADCHNL RV SENSING SENSITIVITY: 0.5 mV
Pulse Gen Serial Number: 7133975
Zone Setting Detection Interval: 230 ms
Zone Setting Detection Interval: 300 ms

## 2014-09-10 ENCOUNTER — Other Ambulatory Visit: Payer: Self-pay | Admitting: Internal Medicine

## 2014-09-18 ENCOUNTER — Other Ambulatory Visit: Payer: Self-pay

## 2014-09-18 MED ORDER — POTASSIUM CHLORIDE CRYS ER 20 MEQ PO TBCR: 40.0000 meq | EXTENDED_RELEASE_TABLET | Freq: Every day | ORAL | Status: DC

## 2014-10-01 ENCOUNTER — Other Ambulatory Visit: Payer: Self-pay | Admitting: Family Medicine

## 2014-10-15 ENCOUNTER — Encounter: Payer: Self-pay | Admitting: Internal Medicine

## 2014-10-24 ENCOUNTER — Other Ambulatory Visit: Payer: 59

## 2014-11-05 ENCOUNTER — Ambulatory Visit: Payer: 59 | Admitting: Family Medicine

## 2014-12-03 ENCOUNTER — Ambulatory Visit (INDEPENDENT_AMBULATORY_CARE_PROVIDER_SITE_OTHER): Payer: 59 | Admitting: Family Medicine

## 2014-12-03 ENCOUNTER — Encounter: Payer: Self-pay | Admitting: Family Medicine

## 2014-12-03 VITALS — BP 124/84 | HR 77 | Temp 97.8°F | Ht 76.0 in | Wt 288.5 lb

## 2014-12-03 DIAGNOSIS — E785 Hyperlipidemia, unspecified: Secondary | ICD-10-CM | POA: Diagnosis not present

## 2014-12-03 DIAGNOSIS — I10 Essential (primary) hypertension: Secondary | ICD-10-CM | POA: Diagnosis not present

## 2014-12-03 DIAGNOSIS — E1165 Type 2 diabetes mellitus with hyperglycemia: Secondary | ICD-10-CM

## 2014-12-03 DIAGNOSIS — E669 Obesity, unspecified: Secondary | ICD-10-CM

## 2014-12-03 DIAGNOSIS — IMO0001 Reserved for inherently not codable concepts without codable children: Secondary | ICD-10-CM

## 2014-12-03 LAB — COMPREHENSIVE METABOLIC PANEL
ALT: 22 U/L (ref 0–53)
AST: 18 U/L (ref 0–37)
Albumin: 4.4 g/dL (ref 3.5–5.2)
Alkaline Phosphatase: 55 U/L (ref 39–117)
BUN: 17 mg/dL (ref 6–23)
CO2: 28 meq/L (ref 19–32)
Calcium: 9.3 mg/dL (ref 8.4–10.5)
Chloride: 101 mEq/L (ref 96–112)
Creatinine, Ser: 0.66 mg/dL (ref 0.40–1.50)
GFR: 134.13 mL/min (ref >60.00)
Glucose, Bld: 219 mg/dL — ABNORMAL HIGH (ref 70–99)
POTASSIUM: 4.3 meq/L (ref 3.5–5.1)
Sodium: 136 mEq/L (ref 135–145)
Total Bilirubin: 0.7 mg/dL (ref 0.2–1.2)
Total Protein: 7.1 g/dL (ref 6.0–8.3)

## 2014-12-03 LAB — HEMOGLOBIN A1C: Hgb A1c MFr Bld: 8.8 % — ABNORMAL HIGH (ref 4.6–6.5)

## 2014-12-03 LAB — LIPID PANEL
Cholesterol: 172 mg/dL (ref 0–200)
HDL: 39.1 mg/dL (ref >39.00)
LDL CALC: 104 mg/dL — AB (ref 0–99)
NonHDL: 132.62
TRIGLYCERIDES: 144 mg/dL (ref 0.0–149.0)
Total CHOL/HDL Ratio: 4
VLDL: 28.8 mg/dL (ref 0.0–40.0)

## 2014-12-03 MED ORDER — GLIPIZIDE ER 5 MG PO TB24: 5.0000 mg | ORAL_TABLET | Freq: Every day | ORAL | Status: DC

## 2014-12-03 NOTE — Progress Notes (Signed)
Pre visit review using our clinic review tool, if applicable. No additional management support is needed unless otherwise documented below in the visit note. 

## 2014-12-03 NOTE — Patient Instructions (Signed)
Eat smaller portions  Also follow diabetic diet  Labs today  Continue metformin Add glipizide once daily - watch out for low blood sugar -check glucose twice daily  Do not skip meals Follow up in 3 months with labs prior

## 2014-12-03 NOTE — Progress Notes (Signed)
Subjective:    Patient ID: Aaron Thompson, male    DOB: 02-Dec-1960, 54 y.o.   MRN: [id number]  HPI Here for f/u of chronic health problems   Is wore out - too busy of a schedule  Feels better in ams - then gets tired  Seeing cardiology- and heart problems are stable  Heart monitor and also CHF    Wt is up 9 lb with bmi of 35- unsure if this is fluid retention from his heart (he does not do daily weights)  Thinks he is eating way too much - downfall is bread and potatoes  (does choose whole wheat)    bp is stable today  No cp or palpitations or headaches or edema  No side effects to medicines  BP Readings from Last 3 Encounters:  12/03/14 124/84  09/02/14 120/78  07/31/14 118/82     Due for cholesterol check  Goal LDL under 100 Lab Results  Component Value Date   CHOL 176 07/24/2014   HDL 38.30* 07/24/2014   LDLCALC 105* 07/24/2014   LDLDIRECT 109.1 02/01/2013   TRIG 164.0* 07/24/2014   CHOLHDL 5 07/24/2014    Diabetes Home sugar results -checking at least once daily -this am was 201 - usually 180-200  DM diet -not great - bread/biscuits (has been to [facility] twice for DM ed)- he knows he eats too big of a portion  Needs to pack meals and stop eating out   Exercise -not doing a lot / used to walk - too tired lately but wants to get back to it  Symptoms A1C last  Lab Results  Component Value Date   HGBA1C 7.7* 07/24/2014  due for re check   No problems with medications -metformin 500 he tolerates / 1000 really bothers his stomach  Renal protection on ARB Last eye exam - waiting on insurance and he will get insurance in Dec   Patient Active Problem List   Diagnosis Date Noted  . Osteoarthritis of both hands 07/31/2014  . Low serum potassium level 04/21/2014  . ICD (implantable cardioverter-defibrillator) discharge 04/19/2014  . Atrial fibrillation with RVR ([place]) 04/18/2014  . Chronic systolic heart failure ([place]) 02/14/2013  . Hyperlipidemia 02/05/2013  .  Diabetes type 2, uncontrolled ([place]) 09/30/2011  . Nonischemic cardiomyopathy ([place]) 08/18/2011  . NSVT (nonsustained ventricular tachycardia) ([place]) 08/18/2011  . Chest pain on exertion 08/17/2011  . Unstable angina ([place]) 08/17/2011  . Type II or unspecified type diabetes mellitus without mention of complication, not stated as uncontrolled 10/06/2010  . Palpitations 05/12/2010  . CARDIOMYOPATHY OTHER DISEASES CLASSIFIED ELSW 07/16/2009  . COLONIC POLYPS 12/09/2008  . OSTEOARTHROSIS UNSPEC WHETHER GEN/LOCALIZED HAND 08/16/2008  . Essential hypertension 05/11/2007  . History of renal calculi 05/11/2007  . FATTY LIVER DISEASE 03/20/2007  . ANXIETY 10/04/2006  . HEARING IMPAIRMENT 10/04/2006  . Atrial fibrillation ([place]) 10/04/2006  . FOLLICULITIS 56/38/9373  . GERD 09/20/2006   Past Medical History  Diagnosis Date  . Hypertension   . DJD (degenerative joint disease)   . Dermatophytosis of the body   . Esophageal reflux   . Other chronic nonalcoholic liver disease     fatty liver  . Unspecified hearing loss   . Anxiety state, unspecified   . Non-ischemic cardiomyopathy ([place])     a.  cath 6/11: OM2 30%; EF 30%;  b. echo 2/12: EF 30%, mild MR, mild LAE;  c.  Echo (11/14):  Mod LVH, EF 25-30%, diff HK, mod LAE.   [name]  PAF (paroxysmal atrial fibrillation) ([place])     a. s/p DCCV 6/11; previously on Pradaxa;  b. Event Monitor 2012->No PAF;  c. 09/2011 s/p DCCV ->Xarelto started. d. 03/2014: inappropriate ICD shocks for AF-RVR, started on Tikosyn, Xarelto restarted.  [name] LBBB (left bundle branch block)     intermittent  . Hx of colonic polyps   . Osteoarthrosis, unspecified whether generalized or localized, hand   . Chronic systolic CHF (congestive heart failure) ([place])   . OSA on CPAP   . Type II diabetes mellitus ([place])   . Calculus of kidney 1981    "passed it on my own"   Past Surgical History  Procedure Laterality Date  . Neck surgery      plate/fusion  . Bilateral knee arthroscopy    .  Stress cardiolite  08/2005    Normal, EF 42%  . Upper gastrointestinal endoscopy  08/2006    GERD  . Abdominal US  01/2007    Fatty liver, no gallstones  . Doppler echocardiography  11/2009    Decreased EF of 35-40%  . Appendectomy    . Cardioversion  09/29/2011    Procedure: CARDIOVERSION;  Surgeon: Thayer Headings, MD;  Location: [place];  Service: Cardiovascular;  Laterality: [name];  . Left heart catheterization with coronary angiogram [name] 08/18/2011    Procedure: LEFT HEART CATHETERIZATION WITH CORONARY ANGIOGRAM;  Surgeon: Peter M Martinique, MD;  Location: [facility] C[facility] LAB;  Service: Cardiovascular;  Laterality: [name];  . Bi-ventricular implantable cardioverter defibrillator [name] 02/14/2013    STJ CRTD implanted by Dr Caryl Comes   Social History  Substance Use Topics  . Smoking status: Never Smoker   . Smokeless tobacco: Former Systems developer    Types: Snuff     Comment: 02/14/2013 "quit snuff in 2006"  . Alcohol Use: No   Family History  Problem Relation Age of Onset  . Hypertension Mother   . Diabetes Mother   . Hypertension Father   . Diabetes Father   . Diabetes Brother   . Kidney disease Brother   . Diabetes Brother   . Heart failure Mother     Died @ 55  . Heart failure Father     Died @ 61  . Other Sister     4 sisters A&W   No Known Allergies Current Outpatient Prescriptions on File Prior to Visit  Medication Sig Dispense Refill  . carvedilol (COREG) 25 MG tablet TAKE 1 AND 1/2 TABLETS (37.5 MG TOTAL) BY MOUTH 2 (TWO) TIMES DAILY WITH A MEAL. 90 tablet 6  . dofetilide (TIKOSYN) 500 MCG capsule Take 1 capsule (500 mcg total) by mouth 2 (two) times daily. 60 capsule 6  . glucose blood (ONE TOUCH ULTRA TEST) test strip CHECK GLUCOSE TWICE DAILY AND AS NEEDED, DX. E11.65 (UNCONTROLLED DM) 100 each 0  . losartan (COZAAR) 50 MG tablet TAKE 1 TABLET BY MOUTH EVERY DAY 30 tablet 6  . metFORMIN (GLUCOPHAGE) 500 MG tablet Take 1 tablet (500 mg total) by mouth 2 (two) times daily with a meal. 60 tablet  11  . Multiple Vitamin (MULTIVITAMIN) tablet Take 1 tablet by mouth daily.      [name] Bicarbonate (ZEGERID OTC) 20-1100 MG CAPS Take 1 capsule by mouth daily.     . potassium chloride SA (KLOR-CON M20) 20 MEQ tablet Take 2 tablets (40 mEq total) by mouth daily. 60 tablet 11  . promethazine (PHENERGAN) 25 MG tablet Take 1 tablet (25 mg total) by mouth every 8 (eight) hours  as needed for nausea or vomiting. 20 tablet 0  . rivaroxaban (XARELTO) 20 MG TABS tablet Take 1 tablet (20 mg total) by mouth daily with supper. 30 tablet 2  . spironolactone (ALDACTONE) 25 MG tablet Take 1 tablet (25 mg total) by mouth daily. 30 tablet 6  . traMADol (ULTRAM) 50 MG tablet Take 1 tablet (50 mg total) by mouth every 8 (eight) hours as needed for moderate pain or severe pain. 30 tablet 0   No current facility-administered medications on file prior to visit.       Review of Systems Review of Systems  Constitutional: Negative for fever, appetite change, fatigue and unexpected weight change.  Eyes: Negative for pain and visual disturbance.  Respiratory: Negative for cough and pos for baseline shortness of breath.   Cardiovascular: Negative for cp or palpitations    Gastrointestinal: Negative for nausea, diarrhea and constipation.  Genitourinary: Negative for urgency and frequency.  Skin: Negative for pallor or rash   Neurological: Negative for weakness, light-headedness, numbness and headaches.  Hematological: Negative for adenopathy. Does not bruise/bleed easily.  Psychiatric/Behavioral: Negative for dysphoric mood. The patient is not nervous/anxious.         Objective:   Physical Exam  Constitutional: He appears well-developed and well-nourished. No distress.  obese and well appearing   HENT:  Head: Normocephalic and atraumatic.  Mouth/Throat: Oropharynx is clear and moist.  Eyes: Conjunctivae and EOM are normal. Pupils are equal, round, and reactive to light.  Neck: Normal range of  motion. Neck supple. No JVD present. Carotid bruit is not present. No thyromegaly present.  Cardiovascular: Normal rate, regular rhythm, normal heart sounds and intact distal pulses.  Exam reveals no gallop.   Pulmonary/Chest: Effort normal and breath sounds normal. No respiratory distress. He has no wheezes. He has no rales.  No crackles  Abdominal: Soft. Bowel sounds are normal. He exhibits no distension, no abdominal bruit and no mass. There is no tenderness.  Musculoskeletal: He exhibits no edema.  Lymphadenopathy:    He has no cervical adenopathy.  Neurological: He is alert. He has normal reflexes.  Skin: Skin is warm and dry. No rash noted.  Psychiatric: He has a normal mood and affect.          Assessment & Plan:   Problem List Items Addressed This Visit      Cardiovascular and Mediastinum   Essential hypertension - Primary    bp in fair control at this time  BP Readings from Last 1 Encounters:  12/03/14 124/84   No changes needed Disc lifstyle change with low sodium diet and exercise  Labs reviewed       Relevant Orders   Comprehensive metabolic panel (Completed)     Endocrine   Diabetes type 2, uncontrolled ([place])    Lab Results  Component Value Date   HGBA1C 8.8* 12/03/2014   This is in poor control  Exercise has been limited and diet not optimal  Rev DM diet-plans to get back on track conitnue metformin 500 (most he can tolerate) and add glipizide 5 mg xl (warned of hypoglycemia) -rev symptoms and will continue to follow glucose readings F/u with lab prior in 3 mo      Relevant Medications   glipiZIDE (GLUCOTROL XL) 5 MG 24 hr tablet   Other Relevant Orders   Hemoglobin A1c (Completed)     Other   Hyperlipidemia    Almost at goal  Disc goals for lipids and reasons to control them Rev  labs with pt Rev low sat fat diet in detail Will watch diet more carefully Re check at 3 mo f/u      Relevant Orders   Lipid panel (Completed)   Obesity     Assoc with DM Discussed how this problem influences overall health and the risks it imposes  Reviewed plan for weight loss with lower calorie diet (via better food choices and also portion control or program like weight watchers) and exercise building up to or more than 30 minutes 5 days per week including some aerobic activity   Urged him to work on this       Relevant Medications   glipiZIDE (GLUCOTROL XL) 5 MG 24 hr tablet

## 2014-12-05 ENCOUNTER — Ambulatory Visit (INDEPENDENT_AMBULATORY_CARE_PROVIDER_SITE_OTHER): Payer: 59 | Admitting: *Deleted

## 2014-12-05 DIAGNOSIS — I519 Heart disease, unspecified: Secondary | ICD-10-CM

## 2014-12-05 DIAGNOSIS — I428 Other cardiomyopathies: Secondary | ICD-10-CM

## 2014-12-05 DIAGNOSIS — E669 Obesity, unspecified: Secondary | ICD-10-CM | POA: Insufficient documentation

## 2014-12-05 DIAGNOSIS — I429 Cardiomyopathy, unspecified: Secondary | ICD-10-CM | POA: Diagnosis not present

## 2014-12-05 NOTE — Assessment & Plan Note (Signed)
Almost at goal  Disc goals for lipids and reasons to control them Rev labs with pt Rev low sat fat diet in detail Will watch diet more carefully Re check at 3 mo f/u

## 2014-12-05 NOTE — Progress Notes (Signed)
Remote ICD transmission.   

## 2014-12-05 NOTE — Assessment & Plan Note (Signed)
bp in fair control at this time  BP Readings from Last 1 Encounters:  12/03/14 124/84   No changes needed Disc lifstyle change with low sodium diet and exercise  Labs reviewed

## 2014-12-05 NOTE — Assessment & Plan Note (Signed)
Lab Results  Component Value Date   HGBA1C 8.8* 12/03/2014   This is in poor control  Exercise has been limited and diet not optimal  Rev DM diet-plans to get back on track conitnue metformin 500 (most he can tolerate) and add glipizide 5 mg xl (warned of hypoglycemia) -rev symptoms and will continue to follow glucose readings F/u with lab prior in 3 mo

## 2014-12-05 NOTE — Assessment & Plan Note (Signed)
Assoc with DM Discussed how this problem influences overall health and the risks it imposes  Reviewed plan for weight loss with lower calorie diet (via better food choices and also portion control or program like weight watchers) and exercise building up to or more than 30 minutes 5 days per week including some aerobic activity   Urged him to work on this

## 2014-12-09 LAB — CUP PACEART REMOTE DEVICE CHECK
Battery Voltage: 2.98 V
Brady Statistic AP VP Percent: 1 %
Brady Statistic AP VS Percent: 1 %
Brady Statistic AS VP Percent: 99 %
Brady Statistic RA Percent Paced: 1 %
HighPow Impedance: 90 Ohm
HighPow Impedance: 90 Ohm
Implantable Lead Implant Date: 20150121
Implantable Lead Location: 753858
Implantable Lead Location: 753859
Implantable Lead Model: 7122
Lead Channel Impedance Value: 530 Ohm
Lead Channel Pacing Threshold Amplitude: 0.75 V
Lead Channel Pacing Threshold Amplitude: 1.125 V
Lead Channel Pacing Threshold Pulse Width: 0.5 ms
Lead Channel Pacing Threshold Pulse Width: 0.5 ms
Lead Channel Setting Pacing Amplitude: 1.75 V
Lead Channel Setting Pacing Pulse Width: 0.5 ms
MDC IDC LEAD IMPLANT DT: 20150121
MDC IDC LEAD IMPLANT DT: 20150121
MDC IDC LEAD LOCATION: 753860
MDC IDC MSMT BATTERY REMAINING LONGEVITY: 52 mo
MDC IDC MSMT BATTERY REMAINING PERCENTAGE: 66 %
MDC IDC MSMT LEADCHNL LV IMPEDANCE VALUE: 980 Ohm
MDC IDC MSMT LEADCHNL LV PACING THRESHOLD AMPLITUDE: 2.125 V
MDC IDC MSMT LEADCHNL RA IMPEDANCE VALUE: 530 Ohm
MDC IDC MSMT LEADCHNL RA PACING THRESHOLD PULSEWIDTH: 0.5 ms
MDC IDC MSMT LEADCHNL RA SENSING INTR AMPL: 5 mV
MDC IDC PG SERIAL: 7133975
MDC IDC SESS DTM: 20161110113643
MDC IDC SET LEADCHNL LV PACING AMPLITUDE: 3.125
MDC IDC SET LEADCHNL RV PACING AMPLITUDE: 2.125
MDC IDC SET LEADCHNL RV PACING PULSEWIDTH: 0.5 ms
MDC IDC SET LEADCHNL RV SENSING SENSITIVITY: 0.5 mV
MDC IDC STAT BRADY AS VS PERCENT: 1 %

## 2014-12-10 ENCOUNTER — Encounter: Payer: Self-pay | Admitting: Cardiology

## 2014-12-24 ENCOUNTER — Encounter: Payer: Self-pay | Admitting: Cardiology

## 2014-12-26 ENCOUNTER — Telehealth: Payer: Self-pay | Admitting: Internal Medicine

## 2014-12-26 NOTE — Telephone Encounter (Signed)
New message      *STAT* If patient is at the pharmacy, call can be transferred to refill team.   1. Which medications need to be refilled? (please list name of each medication and dose if known) furosemide ? Unsure of dosage   2. Which pharmacy/location (including street and city if local pharmacy) is medication to be sent to?[facility] on [address]   3. Do they need a 30 day or 90 day supply?  30 days

## 2014-12-26 NOTE — Telephone Encounter (Signed)
Please call the patient to clarify what it is he needs. I'm wondering if he meant spironolactone. I don't see that he has been on furosemide either.  Thanks!

## 2014-12-27 ENCOUNTER — Other Ambulatory Visit: Payer: Self-pay | Admitting: *Deleted

## 2014-12-27 MED ORDER — SPIRONOLACTONE 25 MG PO TABS: 25.0000 mg | ORAL_TABLET | Freq: Every day | ORAL | Status: DC

## 2014-12-27 NOTE — Telephone Encounter (Signed)
Spoke with patient and he did mean spironolactone not furosemide. I will send rx to [facility] per patients request.

## 2015-02-09 ENCOUNTER — Other Ambulatory Visit: Payer: Self-pay | Admitting: Physician Assistant

## 2015-02-24 LAB — HM DIABETES EYE EXAM

## 2015-03-03 ENCOUNTER — Other Ambulatory Visit: Payer: 59

## 2015-03-03 ENCOUNTER — Other Ambulatory Visit: Payer: Self-pay | Admitting: *Deleted

## 2015-03-03 MED ORDER — CARVEDILOL 25 MG PO TABS: ORAL_TABLET | ORAL | Status: DC

## 2015-03-06 ENCOUNTER — Ambulatory Visit: Payer: BLUE CROSS/BLUE SHIELD | Admitting: *Deleted

## 2015-03-06 DIAGNOSIS — I519 Heart disease, unspecified: Secondary | ICD-10-CM

## 2015-03-06 DIAGNOSIS — I428 Other cardiomyopathies: Secondary | ICD-10-CM

## 2015-03-06 NOTE — Progress Notes (Signed)
Remote ICD transmission.   

## 2015-03-07 ENCOUNTER — Ambulatory Visit: Payer: 59 | Admitting: Family Medicine

## 2015-03-10 ENCOUNTER — Other Ambulatory Visit: Payer: Self-pay | Admitting: Physician Assistant

## 2015-03-13 ENCOUNTER — Encounter: Payer: Self-pay | Admitting: Family Medicine

## 2015-03-13 ENCOUNTER — Ambulatory Visit (INDEPENDENT_AMBULATORY_CARE_PROVIDER_SITE_OTHER): Payer: BLUE CROSS/BLUE SHIELD | Admitting: Family Medicine

## 2015-03-13 VITALS — BP 120/80 | HR 98 | Temp 98.2°F | Wt 304.0 lb

## 2015-03-13 DIAGNOSIS — H6121 Impacted cerumen, right ear: Secondary | ICD-10-CM | POA: Insufficient documentation

## 2015-03-13 DIAGNOSIS — H918X1 Other specified hearing loss, right ear: Secondary | ICD-10-CM

## 2015-03-13 NOTE — Progress Notes (Signed)
BP 120/80 mmHg  Pulse 98  Temp(Src) 98.2 F (36.8 C) (Oral)  Wt 304 lb (137.893 kg)  SpO2 97%   CC: R earache "fluid in my ear" Subjective:    Patient ID: Aaron Thompson, male    DOB: 04-03-1960, 55 y.o.   MRN: [id number]  HPI: Aaron Thompson is a 55 y.o. male presenting on 03/13/2015 for Ear Fullness   4d h/o R sided ear fullness and ache. Keeping him up at night. Tried peroxide in ear. R ear popping. Minimal sxs in left ear. Mild congestion, sneezing, rhinorrhea. Mild cough.   No fevers/chills, PNdrainage, ST, headache.  Muffled hearing. + h/o allergic rhinitis, started zyrtec on Monday.   Endorses h/o R ear and eye trouble since birth.   Non insulin dependent diabetic, atrial fibrillation on xarelto and tikosyn Lab Results  Component Value Date   HGBA1C 8.8* 12/03/2014     Relevant past medical, surgical, family and social history reviewed and updated as indicated. Interim medical history since our last visit reviewed. Allergies and medications reviewed and updated. Current Outpatient Prescriptions on File Prior to Visit  Medication Sig  . carvedilol (COREG) 25 MG tablet TAKE 1 AND 1/2 TABLETS (37.5 MG TOTAL) BY MOUTH 2 (TWO) TIMES DAILY WITH A MEAL.  [name] dofetilide (TIKOSYN) 500 MCG capsule Take 1 capsule (500 mcg total) by mouth 2 (two) times daily.  [name] glipiZIDE (GLUCOTROL XL) 5 MG 24 hr tablet Take 1 tablet (5 mg total) by mouth daily with breakfast.  . glucose blood (ONE TOUCH ULTRA TEST) test strip CHECK GLUCOSE TWICE DAILY AND AS NEEDED, DX. E11.65 (UNCONTROLLED DM)  . losartan (COZAAR) 50 MG tablet TAKE 1 TABLET BY MOUTH EVERY DAY  . metFORMIN (GLUCOPHAGE) 500 MG tablet Take 1 tablet (500 mg total) by mouth 2 (two) times daily with a meal.  . Multiple Vitamin (MULTIVITAMIN) tablet Take 1 tablet by mouth daily.    [name] Bicarbonate (ZEGERID OTC) 20-1100 MG CAPS Take 1 capsule by mouth daily.   . potassium chloride SA (KLOR-CON M20) 20 MEQ tablet Take 2  tablets (40 mEq total) by mouth daily.  . promethazine (PHENERGAN) 25 MG tablet Take 1 tablet (25 mg total) by mouth every 8 (eight) hours as needed for nausea or vomiting.  . rivaroxaban (XARELTO) 20 MG TABS tablet Take 1 tablet (20 mg total) by mouth daily with supper.  [name] spironolactone (ALDACTONE) 25 MG tablet Take 1 tablet (25 mg total) by mouth daily.  . traMADol (ULTRAM) 50 MG tablet Take 1 tablet (50 mg total) by mouth every 8 (eight) hours as needed for moderate pain or severe pain.   No current facility-administered medications on file prior to visit.    Review of Systems Per HPI unless specifically indicated in ROS section     Objective:    BP 120/80 mmHg  Pulse 98  Temp(Src) 98.2 F (36.8 C) (Oral)  Wt 304 lb (137.893 kg)  SpO2 97%  Wt Readings from Last 3 Encounters:  03/13/15 304 lb (137.893 kg)  12/03/14 288 lb 8 oz (130.863 kg)  09/02/14 279 lb 9.6 oz (126.826 kg)    Physical Exam  Constitutional: He appears well-developed and well-nourished. No distress.  HENT:  Head: Normocephalic and atraumatic.  Right Ear: Hearing and external ear normal.  Left Ear: Hearing, tympanic membrane, external ear and ear canal normal.  Nose: Nose normal.  Mouth/Throat: Oropharynx is clear and moist. No oropharyngeal exudate.  Deep cerumen impaction of right ear  with some hearing loss  Eyes: Conjunctivae and EOM are normal. Pupils are equal, round, and reactive to light. No scleral icterus.  Neck: Normal range of motion. Neck supple. No thyromegaly present.  Lymphadenopathy:    He has no cervical adenopathy.  Nursing note and vitals reviewed.  Irrigation performed by CMA with good results.     Assessment & Plan:   Problem List Items Addressed This Visit    Hearing loss of right ear due to cerumen impaction - Primary    Exam otherwise normal. Irrigation performed today - and alligator forceps used to remove residual dry skin. Pt tolerated well with immediate  relief. Discussed ear hygiene - rec against q tips, suggested mineral oil drops as needed.          Follow up plan: Return if symptoms worsen or fail to improve.

## 2015-03-13 NOTE — Assessment & Plan Note (Addendum)
Exam otherwise normal. Irrigation performed today - and alligator forceps used to remove residual dry skin. Pt tolerated well with immediate relief. Discussed ear hygiene - rec against q tips, suggested mineral oil drops as needed.

## 2015-03-13 NOTE — Patient Instructions (Signed)
You have wax covering right ear canal - we irrigated today to remove wax. Let us know if recurrent symptoms.

## 2015-03-13 NOTE — Progress Notes (Signed)
Pre visit review using our clinic review tool, if applicable. No additional management support is needed unless otherwise documented below in the visit note. 

## 2015-03-16 ENCOUNTER — Encounter (HOSPITAL_COMMUNITY): Payer: Self-pay

## 2015-03-16 ENCOUNTER — Emergency Department (HOSPITAL_COMMUNITY): Payer: BLUE CROSS/BLUE SHIELD

## 2015-03-16 ENCOUNTER — Observation Stay (HOSPITAL_COMMUNITY)
Admission: EM | Admit: 2015-03-16 | Discharge: 2015-03-17 | Disposition: A | Discharge status: Home / Self Care | Payer: BLUE CROSS/BLUE SHIELD | Attending: Internal Medicine | Admitting: Internal Medicine

## 2015-03-16 DIAGNOSIS — E119 Type 2 diabetes mellitus without complications: Secondary | ICD-10-CM | POA: Diagnosis not present

## 2015-03-16 DIAGNOSIS — M1990 Unspecified osteoarthritis, unspecified site: Secondary | ICD-10-CM | POA: Diagnosis not present

## 2015-03-16 DIAGNOSIS — IMO0002 Reserved for concepts with insufficient information to code with codable children: Secondary | ICD-10-CM | POA: Diagnosis present

## 2015-03-16 DIAGNOSIS — E1165 Type 2 diabetes mellitus with hyperglycemia: Secondary | ICD-10-CM | POA: Diagnosis present

## 2015-03-16 DIAGNOSIS — Z79899 Other long term (current) drug therapy: Secondary | ICD-10-CM | POA: Insufficient documentation

## 2015-03-16 DIAGNOSIS — Z95810 Presence of automatic (implantable) cardiac defibrillator: Secondary | ICD-10-CM | POA: Insufficient documentation

## 2015-03-16 DIAGNOSIS — K219 Gastro-esophageal reflux disease without esophagitis: Secondary | ICD-10-CM | POA: Diagnosis not present

## 2015-03-16 DIAGNOSIS — I480 Paroxysmal atrial fibrillation: Secondary | ICD-10-CM | POA: Insufficient documentation

## 2015-03-16 DIAGNOSIS — G4733 Obstructive sleep apnea (adult) (pediatric): Secondary | ICD-10-CM | POA: Insufficient documentation

## 2015-03-16 DIAGNOSIS — I5022 Chronic systolic (congestive) heart failure: Secondary | ICD-10-CM | POA: Diagnosis not present

## 2015-03-16 DIAGNOSIS — Z7984 Long term (current) use of oral hypoglycemic drugs: Secondary | ICD-10-CM | POA: Diagnosis not present

## 2015-03-16 DIAGNOSIS — Z4502 Encounter for adjustment and management of automatic implantable cardiac defibrillator: Secondary | ICD-10-CM

## 2015-03-16 DIAGNOSIS — K760 Fatty (change of) liver, not elsewhere classified: Secondary | ICD-10-CM | POA: Diagnosis not present

## 2015-03-16 DIAGNOSIS — I447 Left bundle-branch block, unspecified: Secondary | ICD-10-CM | POA: Diagnosis not present

## 2015-03-16 DIAGNOSIS — E1159 Type 2 diabetes mellitus with other circulatory complications: Secondary | ICD-10-CM | POA: Diagnosis present

## 2015-03-16 DIAGNOSIS — I481 Persistent atrial fibrillation: Principal | ICD-10-CM | POA: Insufficient documentation

## 2015-03-16 DIAGNOSIS — Z87891 Personal history of nicotine dependence: Secondary | ICD-10-CM | POA: Diagnosis not present

## 2015-03-16 DIAGNOSIS — I428 Other cardiomyopathies: Secondary | ICD-10-CM | POA: Insufficient documentation

## 2015-03-16 DIAGNOSIS — R079 Chest pain, unspecified: Secondary | ICD-10-CM | POA: Diagnosis present

## 2015-03-16 DIAGNOSIS — T82198A Other mechanical complication of other cardiac electronic device, initial encounter: Secondary | ICD-10-CM | POA: Diagnosis present

## 2015-03-16 DIAGNOSIS — I4891 Unspecified atrial fibrillation: Secondary | ICD-10-CM

## 2015-03-16 DIAGNOSIS — Z7901 Long term (current) use of anticoagulants: Secondary | ICD-10-CM | POA: Diagnosis not present

## 2015-03-16 DIAGNOSIS — I10 Essential (primary) hypertension: Secondary | ICD-10-CM | POA: Diagnosis present

## 2015-03-16 DIAGNOSIS — I110 Hypertensive heart disease with heart failure: Secondary | ICD-10-CM | POA: Insufficient documentation

## 2015-03-16 MED ORDER — DILTIAZEM HCL 100 MG IV SOLR
5.0000 mg/h | Freq: Once | INTRAVENOUS | Status: AC
  Administered 2015-03-17: 5 mg/h via INTRAVENOUS
  Filled 2015-03-16: qty 100

## 2015-03-16 NOTE — ED Provider Notes (Signed)
CSN: [id number]     Arrival date & time 03/16/15  2324 History  By signing my name below, I, Nicole Kindred, attest that this documentation has been prepared under the direction and in the presence of Veryl Speak, MD.   Electronically Signed: Nicole Kindred, ED Scribe. 03/16/2015. 1:32 AM     Chief Complaint  Patient presents with  . Chest Pain    The history is provided by the patient. No language interpreter was used.   HPI Comments: Aaron Thompson is a 55 y.o. male with PMHx of A-Fib who presents to the Emergency Department complaining of gradual onset, constant chest tightness, onset earlier tonight. Pt states that began experiencing shortness of breath, palpitations, and diaphoresis that woke him from his sleep earlier tonight. He used his defibrillator one time tonight and immediately called 911 due to his symptoms. No worsening or alleviating factors noted. Pt denies any abnormal chest pain or any other pertinent symptoms. Pt was given defibrillator due to low heart function.    Past Medical History  Diagnosis Date  . Hypertension   . DJD (degenerative joint disease)   . Dermatophytosis of the body   . Esophageal reflux   . Other chronic nonalcoholic liver disease     fatty liver  . Unspecified hearing loss   . Anxiety state, unspecified   . Non-ischemic cardiomyopathy ([place])     a.  cath 6/11: OM2 30%; EF 30%;  b. echo 2/12: EF 30%, mild MR, mild LAE;  c.  Echo (11/14):  Mod LVH, EF 25-30%, diff HK, mod LAE.   [name] PAF (paroxysmal atrial fibrillation) ([place])     a. s/p DCCV 6/11; previously on Pradaxa;  b. Event Monitor 2012->No PAF;  c. 09/2011 s/p DCCV ->Xarelto started. d. 03/2014: inappropriate ICD shocks for AF-RVR, started on Tikosyn, Xarelto restarted.  [name] LBBB (left bundle branch block)     intermittent  . Hx of colonic polyps   . Osteoarthrosis, unspecified whether generalized or localized, hand   . Chronic systolic CHF (congestive heart failure) ([place])   . OSA on  CPAP   . Type II diabetes mellitus ([place])   . Calculus of kidney 1981    "passed it on my own"   Past Surgical History  Procedure Laterality Date  . Neck surgery      plate/fusion  . Bilateral knee arthroscopy    . Stress cardiolite  08/2005    Normal, EF 42%  . Upper gastrointestinal endoscopy  08/2006    GERD  . Abdominal US  01/2007    Fatty liver, no gallstones  . Doppler echocardiography  11/2009    Decreased EF of 35-40%  . Appendectomy    . Cardioversion  09/29/2011    Procedure: CARDIOVERSION;  Surgeon: Thayer Headings, MD;  Location: [facility];  Service: Cardiovascular;  Laterality: [name];  . Left heart catheterization with coronary angiogram [name] 08/18/2011    Procedure: LEFT HEART CATHETERIZATION WITH CORONARY ANGIOGRAM;  Surgeon: Peter M Martinique, MD;  Location: [facility] CATH LAB;  Service: Cardiovascular;  Laterality: [name];  . Bi-ventricular implantable cardioverter defibrillator [name] 02/14/2013    STJ CRTD implanted by Dr Caryl Comes   Family History  Problem Relation Age of Onset  . Hypertension Mother   . Diabetes Mother   . Hypertension Father   . Diabetes Father   . Diabetes Brother   . Kidney disease Brother   . Diabetes Brother   . Heart failure Mother     Died @  25  . Heart failure Father     Died @ 67  . Other Sister     4 sisters A&W   Social History  Substance Use Topics  . Smoking status: Never Smoker   . Smokeless tobacco: Former Systems developer    Types: Snuff     Comment: 02/14/2013 "quit snuff in 2006"  . Alcohol Use: No    Review of Systems  Constitutional: Positive for diaphoresis.  Respiratory: Positive for shortness of breath.   Cardiovascular: Positive for palpitations. Negative for chest pain.  All other systems reviewed and are negative.     Allergies  Review of patient's allergies indicates no known allergies.  Home Medications   Prior to Admission medications   Medication Sig Start Date End Date Taking? Authorizing Provider  carvedilol (COREG) 25 MG  tablet TAKE 1 AND 1/2 TABLETS (37.5 MG TOTAL) BY MOUTH 2 (TWO) TIMES DAILY WITH A MEAL. 03/03/15  Yes Dayna N Dunn, PA-C  dofetilide (TIKOSYN) 500 MCG capsule Take 1 capsule (500 mcg total) by mouth 2 (two) times daily. 09/02/14  Yes Amber Sena Slate, NP  glipiZIDE (GLUCOTROL XL) 5 MG 24 hr tablet Take 1 tablet (5 mg total) by mouth daily with breakfast. 12/03/14  Yes Abner Greenspan, MD  losartan (COZAAR) 50 MG tablet TAKE 1 TABLET BY MOUTH EVERY DAY 03/10/15  Yes Deboraha Sprang, MD  metFORMIN (GLUCOPHAGE) 500 MG tablet Take 1 tablet (500 mg total) by mouth 2 (two) times daily with a meal. 07/31/14  Yes Abner Greenspan, MD  Multiple Vitamin (MULTIVITAMIN) tablet Take 1 tablet by mouth daily.     Yes Historical Provider, MD  Omeprazole-Sodium Bicarbonate (ZEGERID OTC) 20-1100 MG CAPS Take 1 capsule by mouth daily.    Yes Historical Provider, MD  potassium chloride SA (KLOR-CON M20) 20 MEQ tablet Take 2 tablets (40 mEq total) by mouth daily. 09/18/14  Yes Deboraha Sprang, MD  promethazine (PHENERGAN) 25 MG tablet Take 1 tablet (25 mg total) by mouth every 8 (eight) hours as needed for nausea or vomiting. 08/06/14  Yes Abner Greenspan, MD  rivaroxaban (XARELTO) 20 MG TABS tablet Take 1 tablet (20 mg total) by mouth daily with supper. 04/22/14  Yes Dayna N Dunn, PA-C  spironolactone (ALDACTONE) 25 MG tablet Take 1 tablet (25 mg total) by mouth daily. 12/27/14  Yes Deboraha Sprang, MD  traMADol (ULTRAM) 50 MG tablet Take 1 tablet (50 mg total) by mouth every 8 (eight) hours as needed for moderate pain or severe pain. 07/31/14  Yes Marne A Tower, MD   BP 134/89 mmHg  Pulse 77  Temp(Src) 98.3 F (36.8 C) (Oral)  Resp 15  SpO2 97% Physical Exam  Constitutional: He is oriented to person, place, and time. He appears well-developed and well-nourished.  HENT:  Head: Normocephalic and atraumatic.  Eyes: EOM are normal.  Neck: Normal range of motion.  Cardiovascular: Normal heart sounds and intact distal pulses.  An irregularly  irregular rhythm present. Tachycardia present.   No murmur heard. Pulmonary/Chest: Effort normal and breath sounds normal. No respiratory distress. He has no wheezes. He has no rales.  Abdominal: Soft. He exhibits no distension. There is no tenderness.  Musculoskeletal: Normal range of motion. He exhibits no edema or tenderness.  Neurological: He is alert and oriented to person, place, and time.  Skin: Skin is warm and dry.  Psychiatric: He has a normal mood and affect. Judgment normal.  Nursing note and vitals reviewed.  ED Course  Procedures (including critical care time) DIAGNOSTIC STUDIES: Oxygen Saturation is 97% on RA, normal by my interpretation.    COORDINATION OF CARE: 11:42 PM-Discussed treatment plan which includes EKG and blood work with pt at bedside and pt agreed to plan.     Labs Review Labs Reviewed  BASIC METABOLIC PANEL - Abnormal; Notable for the following:    CO2 21 (*)    Glucose, Bld 230 (*)    BUN 21 (*)    All other components within normal limits  CBC WITH DIFFERENTIAL/PLATELET - Abnormal; Notable for the following:    WBC 11.6 (*)    Monocytes Absolute 1.1 (*)    All other components within normal limits  TROPONIN I  BRAIN NATRIURETIC PEPTIDE    Imaging Review Dg Chest Port 1 View  03/17/2015  CLINICAL DATA:  Acute onset of shortness of breath and palpitations. Initial encounter. EXAM: PORTABLE CHEST 1 VIEW COMPARISON:  Chest radiograph performed 04/18/2014 FINDINGS: There is mild elevation of the right hemidiaphragm. Mild left basilar atelectasis is noted. No pleural effusion or pneumothorax is seen. There is no evidence of focal opacification, pleural effusion or pneumothorax. The cardiomediastinal silhouette is within normal limits. A pacemaker/AICD is noted overlying the left chest wall, with leads ending overlying the right atrium and right ventricle. No acute osseous abnormalities are seen. Cervical spinal fusion hardware is noted. IMPRESSION:  Mild elevation of the right hemidiaphragm. Mild left basilar atelectasis noted. Electronically Signed   By: Garald Balding M.D.   On: 03/17/2015 00:38   I have personally reviewed and evaluated these images and lab results as part of my medical decision-making.  ED ECG REPORT   Date: 03/17/2015  Rate: 130  Rhythm: atrial fibrillation  QRS Axis: normal  Intervals: normal  ST/T Wave abnormalities: nonspecific T wave changes  Conduction Disutrbances:none  Narrative Interpretation:   Old EKG Reviewed: changes noted  I have personally reviewed the EKG tracing and agree with the computerized printout as noted.   MDM   Final diagnoses:  None     Patient is a 55 year old male with long-standing history of paroxysmal atrial fibrillation. He also has a nonischemic cardiomyopathy and has a Investment banker, corporate. This evening he felt short of breath and palpitations, then was shocked by his defibrillator shortly thereafter. He presents for evaluation of this. On review of his EKG, he has atrial fibrillation with rapid ventricular response. When viewed in the lateral leads this could be easily confused for ventricular tachycardia. I've discussed this with Dr. Claiborne Billings from cardiology who will admit the patient for pacemaker interrogation and further workup.  His rate remained elevated and he was started on a Cardizem drip.  CRITICAL CARE Performed by: Veryl Speak Total critical care time: 45 minutes Critical care time was exclusive of separately billable procedures and treating other patients. Critical care was necessary to treat or prevent imminent or life-threatening deterioration. Critical care was time spent personally by me on the following activities: development of treatment plan with patient and/or surrogate as well as nursing, discussions with consultants, evaluation of patient's response to treatment, examination of patient, obtaining history from patient or surrogate, ordering and  performing treatments and interventions, ordering and review of laboratory studies, ordering and review of radiographic studies, pulse oximetry and re-evaluation of patient's condition.   I personally performed the services described in this documentation, which was scribed in my presence. The recorded information has been reviewed and is accurate.     Veryl Speak, MD 03/17/15  0744 

## 2015-03-16 NOTE — ED Notes (Signed)
Pt from home by [facility]. Pt's defibrillator went off about 10:45. EMS gave a total of 30mg  of cartizem.

## 2015-03-17 ENCOUNTER — Telehealth: Payer: Self-pay | Admitting: Internal Medicine

## 2015-03-17 DIAGNOSIS — I481 Persistent atrial fibrillation: Secondary | ICD-10-CM | POA: Diagnosis not present

## 2015-03-17 DIAGNOSIS — T82198A Other mechanical complication of other cardiac electronic device, initial encounter: Secondary | ICD-10-CM | POA: Diagnosis present

## 2015-03-17 DIAGNOSIS — I4891 Unspecified atrial fibrillation: Secondary | ICD-10-CM | POA: Diagnosis not present

## 2015-03-17 LAB — CBC WITH DIFFERENTIAL/PLATELET
BASOS ABS: 0 10*3/uL (ref 0.0–0.1)
Basophils Relative: 0 %
EOS ABS: 0.3 10*3/uL (ref 0.0–0.7)
EOS PCT: 2 %
HCT: 46.1 % (ref 39.0–52.0)
HEMOGLOBIN: 15.8 g/dL (ref 13.0–17.0)
LYMPHS PCT: 21 %
Lymphs Abs: 2.5 10*3/uL (ref 0.7–4.0)
MCH: 29.3 pg (ref 26.0–34.0)
MCHC: 34.3 g/dL (ref 30.0–36.0)
MCV: 85.4 fL (ref 78.0–100.0)
Monocytes Absolute: 1.1 10*3/uL — ABNORMAL HIGH (ref 0.1–1.0)
Monocytes Relative: 10 %
NEUTROS PCT: 67 %
Neutro Abs: 7.7 10*3/uL (ref 1.7–7.7)
PLATELETS: 196 10*3/uL (ref 150–400)
RBC: 5.4 MIL/uL (ref 4.22–5.81)
RDW: 13.6 % (ref 11.5–15.5)
WBC: 11.6 10*3/uL — AB (ref 4.0–10.5)

## 2015-03-17 LAB — BASIC METABOLIC PANEL
ANION GAP: 15 (ref 5–15)
BUN: 21 mg/dL — ABNORMAL HIGH (ref 6–20)
CALCIUM: 9.4 mg/dL (ref 8.9–10.3)
CHLORIDE: 101 mmol/L (ref 101–111)
CO2: 21 mmol/L — ABNORMAL LOW (ref 22–32)
CREATININE: 0.99 mg/dL (ref 0.61–1.24)
GFR calc non Af Amer: >60 mL/min (ref >60)
Glucose, Bld: 230 mg/dL — ABNORMAL HIGH (ref 65–99)
Potassium: 4 mmol/L (ref 3.5–5.1)
SODIUM: 137 mmol/L (ref 135–145)

## 2015-03-17 LAB — COMPREHENSIVE METABOLIC PANEL
ALK PHOS: 47 U/L (ref 38–126)
ALT: 34 U/L (ref 17–63)
AST: 26 U/L (ref 15–41)
Albumin: 3.5 g/dL (ref 3.5–5.0)
Anion gap: 11 (ref 5–15)
BUN: 21 mg/dL — ABNORMAL HIGH (ref 6–20)
CALCIUM: 9 mg/dL (ref 8.9–10.3)
CO2: 23 mmol/L (ref 22–32)
CREATININE: 0.83 mg/dL (ref 0.61–1.24)
Chloride: 103 mmol/L (ref 101–111)
Glucose, Bld: 213 mg/dL — ABNORMAL HIGH (ref 65–99)
Potassium: 4 mmol/L (ref 3.5–5.1)
Sodium: 137 mmol/L (ref 135–145)
Total Bilirubin: 0.5 mg/dL (ref 0.3–1.2)
Total Protein: 6.1 g/dL — ABNORMAL LOW (ref 6.5–8.1)

## 2015-03-17 LAB — CBC
HEMATOCRIT: 44.7 % (ref 39.0–52.0)
Hemoglobin: 15.4 g/dL (ref 13.0–17.0)
MCH: 29.4 pg (ref 26.0–34.0)
MCHC: 34.5 g/dL (ref 30.0–36.0)
MCV: 85.5 fL (ref 78.0–100.0)
Platelets: 180 10*3/uL (ref 150–400)
RBC: 5.23 MIL/uL (ref 4.22–5.81)
RDW: 13.7 % (ref 11.5–15.5)
WBC: 10.8 10*3/uL — ABNORMAL HIGH (ref 4.0–10.5)

## 2015-03-17 LAB — URINE MICROSCOPIC-ADD ON
Bacteria, UA: NONE SEEN
SQUAMOUS EPITHELIAL / LPF: NONE SEEN

## 2015-03-17 LAB — URINALYSIS, ROUTINE W REFLEX MICROSCOPIC
Bilirubin Urine: NEGATIVE
Hgb urine dipstick: NEGATIVE
Ketones, ur: 15 mg/dL — AB
LEUKOCYTES UA: NEGATIVE
NITRITE: NEGATIVE
PROTEIN: NEGATIVE mg/dL
Specific Gravity, Urine: 1.031 — ABNORMAL HIGH (ref 1.005–1.030)
pH: 5 (ref 5.0–8.0)

## 2015-03-17 LAB — TROPONIN I
Troponin I: <0.03 ng/mL (ref <0.031)
Troponin I: <0.03 ng/mL (ref <0.031)

## 2015-03-17 LAB — MAGNESIUM: Magnesium: 2 mg/dL (ref 1.7–2.4)

## 2015-03-17 LAB — PROTIME-INR
INR: 1.12 (ref 0.00–1.49)
PROTHROMBIN TIME: 14.6 s (ref 11.6–15.2)

## 2015-03-17 LAB — LIPID PANEL
CHOLESTEROL: 150 mg/dL (ref 0–200)
HDL: 34 mg/dL — ABNORMAL LOW (ref >40)
LDL CALC: 88 mg/dL (ref 0–99)
TRIGLYCERIDES: 140 mg/dL (ref <150)
Total CHOL/HDL Ratio: 4.4 RATIO
VLDL: 28 mg/dL (ref 0–40)

## 2015-03-17 LAB — BRAIN NATRIURETIC PEPTIDE
B NATRIURETIC PEPTIDE 5: 16.9 pg/mL (ref 0.0–100.0)
B Natriuretic Peptide: 55.9 pg/mL (ref 0.0–100.0)

## 2015-03-17 MED ORDER — ONE-DAILY MULTI VITAMINS PO TABS: 1.0000 | ORAL_TABLET | Freq: Every day | ORAL | Status: DC

## 2015-03-17 MED ORDER — ADULT MULTIVITAMIN W/MINERALS CH: 1.0000 | ORAL_TABLET | Freq: Every day | ORAL | Status: DC

## 2015-03-17 MED ORDER — POTASSIUM CHLORIDE CRYS ER 20 MEQ PO TBCR: 40.0000 meq | EXTENDED_RELEASE_TABLET | Freq: Every day | ORAL | Status: DC

## 2015-03-17 MED ORDER — PROMETHAZINE HCL 25 MG PO TABS: 25.0000 mg | ORAL_TABLET | Freq: Three times a day (TID) | ORAL | Status: DC | PRN

## 2015-03-17 MED ORDER — SPIRONOLACTONE 25 MG PO TABS
25.0000 mg | ORAL_TABLET | Freq: Every day | ORAL | Status: DC
  Administered 2015-03-17: 25 mg via ORAL
  Filled 2015-03-17: qty 1

## 2015-03-17 MED ORDER — ONDANSETRON HCL 4 MG/2ML IJ SOLN: 4.0000 mg | Freq: Four times a day (QID) | INTRAMUSCULAR | Status: DC | PRN

## 2015-03-17 MED ORDER — MIDAZOLAM HCL 2 MG/2ML IJ SOLN
4.0000 mg | Freq: Once | INTRAMUSCULAR | Status: DC
  Filled 2015-03-17: qty 4

## 2015-03-17 MED ORDER — MIDAZOLAM HCL 2 MG/2ML IJ SOLN: 6.0000 mg | Freq: Once | INTRAMUSCULAR | Status: DC

## 2015-03-17 MED ORDER — RIVAROXABAN 20 MG PO TABS: 20.0000 mg | ORAL_TABLET | Freq: Every day | ORAL | Status: DC

## 2015-03-17 MED ORDER — FENTANYL CITRATE (PF) 100 MCG/2ML IJ SOLN: 100.0000 ug | Freq: Once | INTRAMUSCULAR | Status: DC

## 2015-03-17 MED ORDER — FENTANYL CITRATE (PF) 100 MCG/2ML IJ SOLN
INTRAMUSCULAR | Status: AC
  Filled 2015-03-17: qty 2

## 2015-03-17 MED ORDER — MIDAZOLAM HCL 2 MG/2ML IJ SOLN
INTRAMUSCULAR | Status: AC | PRN
  Administered 2015-03-17: 3 mg via INTRAVENOUS
  Administered 2015-03-17: 1 mg via INTRAVENOUS

## 2015-03-17 MED ORDER — FENTANYL CITRATE (PF) 100 MCG/2ML IJ SOLN
50.0000 ug | Freq: Once | INTRAMUSCULAR | Status: DC
  Filled 2015-03-17: qty 2

## 2015-03-17 MED ORDER — CARVEDILOL 12.5 MG PO TABS
37.5000 mg | ORAL_TABLET | Freq: Two times a day (BID) | ORAL | Status: DC
  Administered 2015-03-17: 37.5 mg via ORAL
  Filled 2015-03-17: qty 3

## 2015-03-17 MED ORDER — DOFETILIDE 500 MCG PO CAPS
500.0000 ug | ORAL_CAPSULE | Freq: Two times a day (BID) | ORAL | Status: DC
  Filled 2015-03-17 (×2): qty 1

## 2015-03-17 MED ORDER — ACETAMINOPHEN 325 MG PO TABS: 650.0000 mg | ORAL_TABLET | ORAL | Status: DC | PRN

## 2015-03-17 MED ORDER — MIDAZOLAM HCL 2 MG/2ML IJ SOLN
INTRAMUSCULAR | Status: AC
  Filled 2015-03-17: qty 4

## 2015-03-17 MED ORDER — TRAMADOL HCL 50 MG PO TABS: 50.0000 mg | ORAL_TABLET | Freq: Three times a day (TID) | ORAL | Status: DC | PRN

## 2015-03-17 MED ORDER — MIDAZOLAM HCL 2 MG/2ML IJ SOLN
INTRAMUSCULAR | Status: AC | PRN
  Administered 2015-03-17 (×2): 2 mg via INTRAVENOUS

## 2015-03-17 MED ORDER — PANTOPRAZOLE SODIUM 40 MG PO TBEC: 40.0000 mg | DELAYED_RELEASE_TABLET | Freq: Every day | ORAL | Status: DC

## 2015-03-17 MED ORDER — FENTANYL CITRATE (PF) 100 MCG/2ML IJ SOLN
INTRAMUSCULAR | Status: AC | PRN
  Administered 2015-03-17 (×4): 25 ug via INTRAVENOUS

## 2015-03-17 MED ORDER — LOSARTAN POTASSIUM 50 MG PO TABS
50.0000 mg | ORAL_TABLET | Freq: Every day | ORAL | Status: DC
  Administered 2015-03-17: 50 mg via ORAL
  Filled 2015-03-17: qty 1

## 2015-03-17 MED ORDER — MIDAZOLAM HCL 2 MG/2ML IJ SOLN
INTRAMUSCULAR | Status: AC
  Filled 2015-03-17: qty 2

## 2015-03-17 MED ORDER — DILTIAZEM HCL 100 MG IV SOLR
5.0000 mg/h | Freq: Once | INTRAVENOUS | Status: AC
  Administered 2015-03-17: 9.5 mg/h via INTRAVENOUS
  Filled 2015-03-17: qty 100

## 2015-03-17 NOTE — ED Notes (Signed)
Delo MD at bedside.

## 2015-03-17 NOTE — ED Notes (Signed)
Cards NP Amber at bedside

## 2015-03-17 NOTE — ED Notes (Signed)
Bryan from [facility] received interrogation, reports pt was shocked last night d/t HR 210.  Gaspar Bidding reports he is consulting with MD re possible changes to programming for pt.

## 2015-03-17 NOTE — CV Procedure (Signed)
EP Procedure Noted]  Procedure: DCCV  Indication: symptomatic atrial fib  Description of the procedure: after informed consent was obtained and documentation of satisfactory anti-coagulation, the patient was prepped in the usual manner. He was sedated with a total of 8 mg of versed and 124mcg of fentanyl under my direction. He was cardioverted with 200 joules of synch biphasic energy, restoring NSR.   Complications: none immediately  Conclusion: successful DCCV in a patient with symptomatic atrial fib.  Mikle Bosworth.D.

## 2015-03-17 NOTE — Telephone Encounter (Signed)
New message     Patient sister calling from emergency @ [facility] [facility] - wife is home sick.    Patient went to emergency room last night  defib went off.    Family is asking when will Dr. Caryl Comes come around to see him or when will his heart be shock

## 2015-03-17 NOTE — Consult Note (Signed)
ELECTROPHYSIOLOGY CONSULT NOTE    Patient ID: [facility] MRN: [id number], DOB/AGE: 1960-02-01 55 y.o.  Admit date: 03/16/2015 Date of Consult: 03/17/2015  Primary Physician: [name], MD Electrophysiologist: Caryl Comes  Reason for Consultation: AF with RVR and inappropriate ICD shocks   HPI:  Aaron Thompson is a 55 y.o. male with a past medical history significant for morbid obesity, NICM, persistent atrial fibrillation (on Tikosyn and with prior inappropriate ICD shocks), LBBB, OSA on CPAP.  He was last seen by me in clinic 08/2014 and doing well at that time without recurrent atrial fibrillation.  Last night, he developed shortness of breath, palpitations, and diaphoresis followed by ICD shock for which he called 911.  He was evaluated in the ER and found to be in AF with RVR.  He was started on Diltiazem drip and his rate improved. EP has been asked to evaluate for treatment options.  Device interrogation demonstrates dual chamber ICD with normal device function. AF started 03-16-15 at 10:43PM.  Ventricular rate accelerated into VT zone (programmed 200-260bpm) and he received 3 spins of ATP followed by HV shock which did not terminate AF.  He had subsequent ATP delivered.   Last echo 03/2014 demonstrated EF 15-20% with diffuse hypokinesis, grade 1 diastolic dysfunction, RV systolic dysfunction, LA 41.   He was started on Tikosyn 03/2014 in the setting of inappropriate ICD shocks and has maintained SR since.   He reports compliance with medications and CPAP.  Other than last night, he denies chest pain, palpitations, dizziness, syncope, recent fevers or chills. He has stable dyspnea on exertion.   Past Medical History  Diagnosis Date  . Hypertension   . DJD (degenerative joint disease)   . Dermatophytosis of the body   . Esophageal reflux   . Other chronic nonalcoholic liver disease     fatty liver  . Unspecified hearing loss   . Anxiety state, unspecified   . Non-ischemic  cardiomyopathy ([place])     a.  cath 6/11: OM2 30%; EF 30%;  b. echo 2/12: EF 30%, mild MR, mild LAE;  c.  Echo (11/14):  Mod LVH, EF 25-30%, diff HK, mod LAE.   [name] PAF (paroxysmal atrial fibrillation) ([place])     a. s/p DCCV 6/11; previously on Pradaxa;  b. Event Monitor 2012->No PAF;  c. 09/2011 s/p DCCV ->Xarelto started. d. 03/2014: inappropriate ICD shocks for AF-RVR, started on Tikosyn, Xarelto restarted.  [name] LBBB (left bundle branch block)     intermittent  . Hx of colonic polyps   . Osteoarthrosis, unspecified whether generalized or localized, hand   . Chronic systolic CHF (congestive heart failure) ([place])   . OSA on CPAP   . Type II diabetes mellitus ([place])   . Calculus of kidney 1981    "passed it on my own"     Surgical History:  Past Surgical History  Procedure Laterality Date  . Neck surgery      plate/fusion  . Bilateral knee arthroscopy    . Stress cardiolite  08/2005    Normal, EF 42%  . Upper gastrointestinal endoscopy  08/2006    GERD  . Abdominal US  01/2007    Fatty liver, no gallstones  . Doppler echocardiography  11/2009    Decreased EF of 35-40%  . Appendectomy    . Cardioversion  09/29/2011    Procedure: CARDIOVERSION;  Surgeon: Thayer Headings, MD;  Location: [facility];  Service: Cardiovascular;  Laterality: [name];  . Left heart catheterization  with coronary angiogram [name] 08/18/2011    Procedure: LEFT HEART CATHETERIZATION WITH CORONARY ANGIOGRAM;  Surgeon: Peter M Martinique, MD;  Location: [facility] C[facility] LAB;  Service: Cardiovascular;  Laterality: [name];  . Bi-ventricular implantable cardioverter defibrillator [name] 02/14/2013    STJ CRTD implanted by Dr Caryl Comes      (Not in a hospital admission)  Inpatient Medications:  . carvedilol  37.5 mg Oral BID WC  . dofetilide  500 mcg Oral BID  . losartan  50 mg Oral Daily  . multivitamin with minerals  1 tablet Oral Daily  . pantoprazole  40 mg Oral Daily  . potassium chloride SA  40 mEq Oral Daily  . rivaroxaban  20 mg Oral Q supper    . spironolactone  25 mg Oral Daily    Allergies: No Known Allergies  Social History   Social History  . Marital Status: Married    Spouse Name: [name]  . Number of Children: 2  . Years of Education: [name]   Occupational History  . PAINTER Unemployed   Social History Main Topics  . Smoking status: Never Smoker   . Smokeless tobacco: Former Systems developer    Types: Snuff     Comment: 02/14/2013 "quit snuff in 2006"  . Alcohol Use: No  . Drug Use: No  . Sexual Activity: Yes   Other Topics Concern  . Not on file   Social History Narrative   Lives in [place] with wife.  Works @ [employer] in [employer].  Not routinely exercising.     Family History  Problem Relation Age of Onset  . Hypertension Mother   . Diabetes Mother   . Hypertension Father   . Diabetes Father   . Diabetes Brother   . Kidney disease Brother   . Diabetes Brother   . Heart failure Mother     Died @ 62  . Heart failure Father     Died @ 8  . Other Sister     4 sisters A&W     Review of Systems: All other systems reviewed and are otherwise negative except as noted above.  Physical Exam: Filed Vitals:   03/17/15 0800 03/17/15 0815 03/17/15 0845 03/17/15 0945  BP: 97/82 96/63 103/73 117/63  Pulse: 144 41 80 69  Temp:      TempSrc:      Resp: 15 14 9 17   SpO2: 96% 96% 97% 96%    GEN- The patient is obese appearing, alert and oriented x 3 today.   HEENT: normocephalic, atraumatic; sclera clear, conjunctiva pink; hearing intact; oropharynx clear; neck supple  Lungs- Clear to ausculation bilaterally, normal work of breathing.  No wheezes, rales, rhonchi Heart- Irregular rate and rhythm, no murmurs, rubs or gallops, PMI not laterally displaced GI- soft, non-tender, non-distended, bowel sounds present, no hepatosplenomegaly Extremities- no clubbing, cyanosis, or edema; DP/PT/radial pulses 2+ bilaterally MS- no significant deformity or atrophy Skin- warm and dry, no rash or lesion Psych- euthymic  mood, full affect Neuro- strength and sensation are intact  Labs:   Lab Results  Component Value Date   WBC 10.8* 03/17/2015   HGB 15.4 03/17/2015   HCT 44.7 03/17/2015   MCV 85.5 03/17/2015   PLT 180 03/17/2015    Recent Labs Lab 03/17/15 0625  NA 137  K 4.0  CL 103  CO2 23  BUN 21*  CREATININE 0.83  CALCIUM 9.0  PROT 6.1*  BILITOT 0.5  ALKPHOS 47  ALT 34  AST 26  GLUCOSE 213*  Radiology/Studies: Dg Chest Port 1 View 03/17/2015  CLINICAL DATA:  Acute onset of shortness of breath and palpitations. Initial encounter. EXAM: PORTABLE CHEST 1 VIEW COMPARISON:  Chest radiograph performed 04/18/2014 FINDINGS: There is mild elevation of the right hemidiaphragm. Mild left basilar atelectasis is noted. No pleural effusion or pneumothorax is seen. There is no evidence of focal opacification, pleural effusion or pneumothorax. The cardiomediastinal silhouette is within normal limits. A pacemaker/AICD is noted overlying the left chest wall, with leads ending overlying the right atrium and right ventricle. No acute osseous abnormalities are seen. Cervical spinal fusion hardware is noted. IMPRESSION: Mild elevation of the right hemidiaphragm. Mild left basilar atelectasis noted. Electronically Signed   By: Garald Balding M.D.   On: 03/17/2015 00:38    EKG: atrial fibrillation with RVR, ventricular rate 122  TELEMETRY: atrial fibrillation with RVR  DEVICE HISTORY: STJ CRTD implanted 01/2013 by Dr Caryl Comes for NICM  Assessment/Plan: 1.  Persistent atrial fibrillation The patient has persistent recurring atrial fibrillation in the setting of Tikosyn therapy. This is his first recurrence in a year.  Would recommend DCCV at this time and discharge to home. Unfortunately, the ER is not able to accommodate at this time. I think that this patient could be discharged post cardioversion and so am working on alternative plans for DCCV today (endo schedule full today and tomorrow) Continue Xarelto  for [place] of 3 - he reports compliance for last 4 weeks and AF started at 10:47 last night per device interrogation If he has recurrent AF despite Tikosyn again, will need to consider alternative AAD therapy I am not sure that he is an ablation candidate at this time with weight  2.  Morbid Obesity Weight loss advised  3.  NICM Slightly volume overloaded on exam Continue current therapy May need to increase diuretics for the short term  4.  HTN Stable No change required today  5.  OSA Compliance with CPAP encouraged (he wears nightly)  Dr Lovena Le to see later today. Hopefully, we will be able to cardivoert and discharge early.   Signed, [name], NP 03/17/2015 10:08 AM   EP Attending  Patient seen and examined. Agree with above. He has symptomatic atrial fibrillation since last night. He has not missed any anti-coagulation. He is NPO. I have discussed the treatment options with the patient and he is willing to proceed with DCCV to restore NSR.  Cristopher Peru, M.D.

## 2015-03-17 NOTE — H&P (Signed)
Aaron Thompson is an 55 y.o. male.   Chief Complaint: chest pain Primary Cardiologist: Caryl Comes HPI: Aaron Thompson is a 55 yo man with PMH of non-ischemic cardiomyopathy, mild conronary artery disease, T2DM, OSA on CPAP, LBBB, atrial fibrillation on tikosyn with previous inappropriate therapies for atrial fibrillation who presents with ICD shock. He tells me he's been feeling fine, no issues, no sick contacts, no infectious symptoms of nausea/vomiting/diarrhea/fever/chills. No recent significant travel. He tells me he's been compliant with his tikosyn and his xarelto. He was found to be in atrial fibrillation with RVR in the ER and ditiazem gtt was started.   Past Medical History  Diagnosis Date  . Hypertension   . DJD (degenerative joint disease)   . Dermatophytosis of the body   . Esophageal reflux   . Other chronic nonalcoholic liver disease     fatty liver  . Unspecified hearing loss   . Anxiety state, unspecified   . Non-ischemic cardiomyopathy (A[facility])     a.  cath 6/11: OM2 30%; EF 30%;  b. echo 2/12: EF 30%, mild MR, mild LAE;  c.  Echo (11/14):  Mod LVH, EF 25-30%, diff HK, mod LAE.   [name] PAF (paroxysmal atrial fibrillation) (A[facility] Thompson)     a. s/p DCCV 6/11; previously on Pradaxa;  b. Event Monitor 2012->No PAF;  c. 09/2011 s/p DCCV ->Xarelto started. d. 03/2014: inappropriate ICD shocks for AF-RVR, started on Tikosyn, Xarelto restarted.  [name] LBBB (left bundle branch block)     intermittent  . Hx of colonic polyps   . Osteoarthrosis, unspecified whether generalized or localized, hand   . Chronic systolic CHF (congestive heart failure) (Aaron Thompson)   . OSA on CPAP   . Type II diabetes mellitus (Aaron Thompson)   . Calculus of kidney 1981    "passed it on my own"    Past Surgical History  Procedure Laterality Date  . Neck surgery      plate/fusion  . Bilateral knee arthroscopy    . Stress cardiolite  08/2005    Normal, EF 42%  . Upper gastrointestinal endoscopy  08/2006    GERD  . Abdominal US  01/2007     Fatty liver, no gallstones  . Doppler echocardiography  11/2009    Decreased EF of 35-40%  . Appendectomy    . Cardioversion  09/29/2011    Procedure: CARDIOVERSION;  Surgeon: Thayer Headings, MD;  Location: [facility];  Service: Cardiovascular;  Laterality: [name];  . Left heart catheterization with coronary angiogram [name] 08/18/2011    Procedure: LEFT HEART CATHETERIZATION WITH CORONARY ANGIOGRAM;  Surgeon: Peter M Martinique, MD;  Location: [facility] CATH LAB;  Service: Cardiovascular;  Laterality: [name];  . Bi-ventricular implantable cardioverter defibrillator [name] 02/14/2013    STJ CRTD implanted by Dr Caryl Comes    Family History  Problem Relation Age of Onset  . Hypertension Mother   . Diabetes Mother   . Hypertension Father   . Diabetes Father   . Diabetes Brother   . Kidney disease Brother   . Diabetes Brother   . Heart failure Mother     Died @ 48  . Heart failure Father     Died @ 23  . Other Sister     4 sisters A&W   Social History:  reports that he has never smoked. He has quit using smokeless tobacco. His smokeless tobacco use included Snuff. He reports that he does not drink alcohol or use illicit drugs.  Allergies: No Known Allergies   (  Not in a hospital admission)  Results for orders placed or performed during the hospital encounter of 03/16/15 (from the past 48 hour(s))  Basic metabolic panel     Status: Abnormal   Collection Time: 03/17/15 12:10 AM  Result Value Ref Range   Sodium 137 135 - 145 mmol/L   Potassium 4.0 3.5 - 5.1 mmol/L   Chloride 101 101 - 111 mmol/L   CO2 21 (L) 22 - 32 mmol/L   Glucose, Bld 230 (H) 65 - 99 mg/dL   BUN 21 (H) 6 - 20 mg/dL   Creatinine, Ser 0.99 0.61 - 1.24 mg/dL   Calcium 9.4 8.9 - 10.3 mg/dL   GFR calc non Af Amer >60 >60 mL/min   GFR calc Af Amer >60 >60 mL/min    Comment: (NOTE) The eGFR has been calculated using the CKD EPI equation. This calculation has not been validated in all clinical situations. eGFR's persistently <60 mL/min signify  possible Chronic Kidney Disease.    Anion gap 15 5 - 15  Troponin I     Status: None   Collection Time: 03/17/15 12:10 AM  Result Value Ref Range   Troponin I <0.03 <0.031 ng/mL    Comment:        NO INDICATION OF MYOCARDIAL INJURY.   CBC with Differential     Status: Abnormal   Collection Time: 03/17/15 12:10 AM  Result Value Ref Range   WBC 11.6 (H) 4.0 - 10.5 K/uL   RBC 5.40 4.22 - 5.81 MIL/uL   Hemoglobin 15.8 13.0 - 17.0 g/dL   HCT 46.1 39.0 - 52.0 %   MCV 85.4 78.0 - 100.0 fL   MCH 29.3 26.0 - 34.0 pg   MCHC 34.3 30.0 - 36.0 g/dL   RDW 13.6 11.5 - 15.5 %   Platelets 196 150 - 400 K/uL   Neutrophils Relative % 67 %   Neutro Abs 7.7 1.7 - 7.7 K/uL   Lymphocytes Relative 21 %   Lymphs Abs 2.5 0.7 - 4.0 K/uL   Monocytes Relative 10 %   Monocytes Absolute 1.1 (H) 0.1 - 1.0 K/uL   Eosinophils Relative 2 %   Eosinophils Absolute 0.3 0.0 - 0.7 K/uL   Basophils Relative 0 %   Basophils Absolute 0.0 0.0 - 0.1 K/uL  Brain natriuretic peptide     Status: None   Collection Time: 03/17/15 12:10 AM  Result Value Ref Range   B Natriuretic Peptide 16.9 0.0 - 100.0 pg/mL   Dg Chest Port 1 View  03/17/2015  CLINICAL DATA:  Acute onset of shortness of breath and palpitations. Initial encounter. EXAM: PORTABLE CHEST 1 VIEW COMPARISON:  Chest radiograph performed 04/18/2014 FINDINGS: There is mild elevation of the right hemidiaphragm. Mild left basilar atelectasis is noted. No pleural effusion or pneumothorax is seen. There is no evidence of focal opacification, pleural effusion or pneumothorax. The cardiomediastinal silhouette is within normal limits. A pacemaker/AICD is noted overlying the left chest wall, with leads ending overlying the right atrium and right ventricle. No acute osseous abnormalities are seen. Cervical spinal fusion hardware is noted. IMPRESSION: Mild elevation of the right hemidiaphragm. Mild left basilar atelectasis noted. Electronically Signed   By: Garald Balding M.D.    On: 03/17/2015 00:38    Review of Systems  Constitutional: Negative for fever, chills, weight loss and malaise/fatigue.  HENT: Negative for ear discharge and ear pain.   Eyes: Negative for double vision, photophobia and pain.  Respiratory: Negative for cough and shortness  of breath.   Cardiovascular: Negative for palpitations, orthopnea and claudication.       +ICD firing  Gastrointestinal: Negative for heartburn, nausea, vomiting and abdominal pain.  Musculoskeletal: Negative for myalgias, back pain and neck pain.  Skin: Negative for rash.  Neurological: Negative for dizziness, tingling, tremors and headaches.  Endo/Heme/Allergies: Negative for polydipsia. Does not bruise/bleed easily.  Psychiatric/Behavioral: Negative for depression, suicidal ideas, hallucinations and substance abuse.    Blood pressure 104/74, pulse 45, temperature 98.3 F (36.8 C), temperature source Oral, resp. rate 13, SpO2 97 %. Physical Exam  Nursing note and vitals reviewed. Constitutional: He is oriented to person, place, and time. He appears well-developed and well-nourished. No distress.  HENT:  Head: Normocephalic and atraumatic.  Nose: Nose normal.  Mouth/Throat: Oropharynx is clear and moist. No oropharyngeal exudate.  Eyes: Conjunctivae and EOM are normal. Pupils are equal, round, and reactive to light. No scleral icterus.  Neck: Normal range of motion. Neck supple. No JVD present. No tracheal deviation present.  Cardiovascular: Intact distal pulses.  Exam reveals no gallop.   No murmur heard. Irregularly irregular  Respiratory: Effort normal and breath sounds normal. No respiratory distress. He has no wheezes. He has no rales.  GI: Soft. Bowel sounds are normal. He exhibits no distension. There is no tenderness. There is no rebound.  Musculoskeletal: Normal range of motion. He exhibits no edema or tenderness.  Neurological: He is alert and oriented to person, place, and time. No cranial nerve  deficit. Coordination normal.  Skin: Skin is warm and dry. No rash noted. He is not diaphoretic. No erythema.  Psychiatric: He has a normal mood and affect. His behavior is normal. Thought content normal.    Labs reviewed; bun/cr 21/0.9, na 137, K 4.0, trop <0.03, wbc 11.6, h/h 15.8/46.1 3/16 Echo: EF 15-20%, diffuse hypokinesis, grade I DD, mild MR, dilated LA, moderate RV enlargement and dysfunction, dilated Aortic Root 4.7 cm Assessment/Plan Mr. Delman is a 55 yo man with PMH of non-ischemic cardiomyopathy, mild conronary artery disease, T2DM, OSA on CPAP, LBBB, atrial fibrillation on tikosyn with previous inappropriate therapies for atrial fibrillation who presents with ICD shocks (St. Jude device). CHADS2VASC = 3. Will evaluate for triggers Problem List 1. ICD firing 2. Leukocytosis 3. Atrial fibrillation with RVR 4. Non-ischemic cardiomyopathy with chronic systolic heart failure 5. Mild CAD 6. Dilated Aortic Root 7. T2DM  Plan 1. Observation on telemetry 2. Trend troponins, review interrogation 3. Continue rate control for atrial fibrillation with carvedilol, diltiazem gtt; consider TEE-DCCV in AM unless trigger for atrial fibrillation found; urinalysis, TSH, BNP 4. Continue tikosyn for rhythm control for atrial fibrillation 5. Holding metformin, glipizide 6. Continue xarelto 20 mg daily 7. Continue HF medications with coreg, spironolactone, losartan  Jules Husbands, MD 03/17/2015, 2:02 AM

## 2015-03-17 NOTE — Telephone Encounter (Signed)
I just saw this message- the patient was DCCV'ed about 1330 by Dr. Lovena Le. Reviewed with [name], NP. Per [employer], the patient is going home today. He will follow up with her next week- I do not need to call the family back.

## 2015-03-17 NOTE — Sedation Documentation (Signed)
Pt shocked at 200J

## 2015-03-17 NOTE — ED Notes (Addendum)
This RN paged cards MD, reported HR 140-180, asked about plan to cardiovert.  MD advised NP/PA would come to ED to eval pt.

## 2015-03-17 NOTE — Progress Notes (Signed)
Doing well post cardioversion Will discharge patient from ER Follow up with me next week  [name], NP 03/17/2015 3:44 PM

## 2015-03-18 LAB — HEMOGLOBIN A1C
Hgb A1c MFr Bld: 7.7 % — ABNORMAL HIGH (ref 4.8–5.6)
MEAN PLASMA GLUCOSE: 174 mg/dL

## 2015-03-26 ENCOUNTER — Encounter: Payer: Self-pay | Admitting: Internal Medicine

## 2015-03-26 ENCOUNTER — Encounter: Payer: Self-pay | Admitting: Nurse Practitioner

## 2015-03-26 ENCOUNTER — Telehealth: Payer: Self-pay | Admitting: Nurse Practitioner

## 2015-03-26 ENCOUNTER — Ambulatory Visit (INDEPENDENT_AMBULATORY_CARE_PROVIDER_SITE_OTHER): Payer: BLUE CROSS/BLUE SHIELD | Admitting: Nurse Practitioner

## 2015-03-26 VITALS — BP 110/80 | HR 88 | Ht 76.0 in | Wt 294.9 lb

## 2015-03-26 DIAGNOSIS — I481 Persistent atrial fibrillation: Secondary | ICD-10-CM | POA: Diagnosis not present

## 2015-03-26 DIAGNOSIS — G4733 Obstructive sleep apnea (adult) (pediatric): Secondary | ICD-10-CM

## 2015-03-26 DIAGNOSIS — I5022 Chronic systolic (congestive) heart failure: Secondary | ICD-10-CM | POA: Diagnosis not present

## 2015-03-26 DIAGNOSIS — I4819 Other persistent atrial fibrillation: Secondary | ICD-10-CM

## 2015-03-26 DIAGNOSIS — I428 Other cardiomyopathies: Secondary | ICD-10-CM

## 2015-03-26 DIAGNOSIS — I429 Cardiomyopathy, unspecified: Secondary | ICD-10-CM | POA: Diagnosis not present

## 2015-03-26 DIAGNOSIS — Z9989 Dependence on other enabling machines and devices: Secondary | ICD-10-CM

## 2015-03-26 LAB — CUP PACEART INCLINIC DEVICE CHECK
Implantable Lead Implant Date: 20150121
Implantable Lead Location: 753859
Implantable Lead Location: 753860
Lead Channel Setting Pacing Amplitude: 2 V
Lead Channel Setting Pacing Pulse Width: 0.5 ms
Lead Channel Setting Sensing Sensitivity: 0.5 mV
MDC IDC LEAD IMPLANT DT: 20150121
MDC IDC LEAD IMPLANT DT: 20150121
MDC IDC LEAD LOCATION: 753858
MDC IDC SESS DTM: 20170301130206
MDC IDC SET LEADCHNL LV PACING AMPLITUDE: 3.25 V
MDC IDC SET LEADCHNL LV PACING PULSEWIDTH: 0.5 ms
MDC IDC SET LEADCHNL RA PACING AMPLITUDE: 1.75 V
Pulse Gen Serial Number: 7133975

## 2015-03-26 MED ORDER — POTASSIUM CHLORIDE CRYS ER 20 MEQ PO TBCR: 40.0000 meq | EXTENDED_RELEASE_TABLET | Freq: Every day | ORAL | Status: DC

## 2015-03-26 MED ORDER — SPIRONOLACTONE 25 MG PO TABS: 25.0000 mg | ORAL_TABLET | Freq: Every day | ORAL | Status: DC

## 2015-03-26 MED ORDER — CARVEDILOL 25 MG PO TABS: ORAL_TABLET | ORAL | Status: DC

## 2015-03-26 MED ORDER — DOFETILIDE 500 MCG PO CAPS: 500.0000 ug | ORAL_CAPSULE | Freq: Two times a day (BID) | ORAL | Status: DC

## 2015-03-26 MED ORDER — LOSARTAN POTASSIUM 50 MG PO TABS: 50.0000 mg | ORAL_TABLET | Freq: Every day | ORAL | Status: DC

## 2015-03-26 MED ORDER — RIVAROXABAN 20 MG PO TABS: 20.0000 mg | ORAL_TABLET | Freq: Every day | ORAL | Status: DC

## 2015-03-26 NOTE — Telephone Encounter (Signed)
New message     Pt saw Amber this am.  He has a question about the cardioversion procedure he had.

## 2015-03-26 NOTE — Progress Notes (Signed)
Electrophysiology Office Note Date: 03/26/2015  ID:  Aaron Thompson, DOB July 16, 1960, MRN [id number]  PCP: Aaron Pardon, MD Electrophysiologist: Aaron Thompson  CC: Hospital follow up  Aaron Thompson is a 55 y.o. male seen today for Dr Aaron Thompson.  He presents today for post hospital electrophysiology followup.  He was seen in the ER with recurrent AF with RVR and inappropriate ICD shocks. He was cardioverted and discharged with planned follow up today.  Since discharge, the patient reports doing very well. He denies chest pain, palpitations, dyspnea, PND, orthopnea, nausea, vomiting, dizziness, syncope, edema, weight gain, or early satiety.  He has not had ICD shocks. He is anxious about repeat inappropriate therapy.  Device History: STJ CRTD implanted 2015 for NICM History of appropriate therapy: No - inappropriate therapy for AF with RVR History of AAD therapy: Yes - Tikosyn for AF   Past Medical History  Diagnosis Date  . Hypertension   . DJD (degenerative joint disease)   . Dermatophytosis of the body   . Esophageal reflux   . Other chronic nonalcoholic liver disease     fatty liver  . Unspecified hearing loss   . Anxiety state, unspecified   . Non-ischemic cardiomyopathy ([place])     a.  cath 6/11: OM2 30%; EF 30%;  b. echo 2/12: EF 30%, mild MR, mild LAE;  c.  Echo (11/14):  Mod LVH, EF 25-30%, diff HK, mod LAE.   [name] PAF (paroxysmal atrial fibrillation) ([place])     a. s/p DCCV 6/11; previously on Pradaxa;  b. Event Monitor 2012->No PAF;  c. 09/2011 s/p DCCV ->Xarelto started. d. 03/2014: inappropriate ICD shocks for AF-RVR, started on Tikosyn, Xarelto restarted.  [name] LBBB (left bundle branch block)     intermittent  . Hx of colonic polyps   . Osteoarthrosis, unspecified whether generalized or localized, hand   . Chronic systolic CHF (congestive heart failure) ([place])   . OSA on CPAP   . Type II diabetes mellitus ([place])   . Calculus of kidney 1981    "passed it on my own"   Past Surgical History   Procedure Laterality Date  . Neck surgery      plate/fusion  . Bilateral knee arthroscopy    . Stress cardiolite  08/2005    Normal, EF 42%  . Upper gastrointestinal endoscopy  08/2006    GERD  . Abdominal US  01/2007    Fatty liver, no gallstones  . Doppler echocardiography  11/2009    Decreased EF of 35-40%  . Appendectomy    . Cardioversion  09/29/2011    Procedure: CARDIOVERSION;  Surgeon: Thayer Headings, MD;  Location: [place];  Service: Cardiovascular;  Laterality: [name];  . Left heart catheterization with coronary angiogram [name] 08/18/2011    Procedure: LEFT HEART CATHETERIZATION WITH CORONARY ANGIOGRAM;  Surgeon: Peter M Martinique, MD;  Location: [facility] CATH LAB;  Service: Cardiovascular;  Laterality: [name];  . Bi-ventricular implantable cardioverter defibrillator [name] 02/14/2013    STJ CRTD implanted by Dr Aaron Thompson    Current Outpatient Prescriptions  Medication Sig Dispense Refill  . carvedilol (COREG) 25 MG tablet TAKE 1 AND 1/2 TABLETS (37.5 MG TOTAL) BY MOUTH 2 (TWO) TIMES DAILY WITH A MEAL. 270 tablet 1  . dofetilide (TIKOSYN) 500 MCG capsule Take 1 capsule (500 mcg total) by mouth 2 (two) times daily. 60 capsule 6  . glipiZIDE (GLUCOTROL XL) 5 MG 24 hr tablet Take 1 tablet (5 mg total) by mouth daily with breakfast. 30  tablet 11  . losartan (COZAAR) 50 MG tablet Take 1 tablet (50 mg total) by mouth daily. 30 tablet 5  . metFORMIN (GLUCOPHAGE) 500 MG tablet Take 1 tablet (500 mg total) by mouth 2 (two) times daily with a meal. 60 tablet 11  . Multiple Vitamin (MULTIVITAMIN) tablet Take 1 tablet by mouth daily.      [name] Bicarbonate (ZEGERID OTC) 20-1100 MG CAPS Take 1 capsule by mouth daily.     . potassium chloride SA (KLOR-CON M20) 20 MEQ tablet Take 2 tablets (40 mEq total) by mouth daily. 60 tablet 11  . promethazine (PHENERGAN) 25 MG tablet Take 1 tablet (25 mg total) by mouth every 8 (eight) hours as needed for nausea or vomiting. 30 tablet 0  . rivaroxaban (XARELTO)  20 MG TABS tablet Take 1 tablet (20 mg total) by mouth daily with supper. 30 tablet 2  . spironolactone (ALDACTONE) 25 MG tablet Take 1 tablet (25 mg total) by mouth daily. 30 tablet 8  . traMADol (ULTRAM) 50 MG tablet Take 1 tablet (50 mg total) by mouth every 8 (eight) hours as needed for moderate pain or severe pain. 30 tablet 0   No current facility-administered medications for this visit.    Allergies:   Review of patient's allergies indicates no known allergies.   Social History: Social History   Social History  . Marital Status: Married    Spouse Name: [name]  . Number of Children: 2  . Years of Education: [name]   Occupational History  . PAINTER Unemployed   Social History Main Topics  . Smoking status: Never Smoker   . Smokeless tobacco: Former Systems developer    Types: Snuff     Comment: 02/14/2013 "quit snuff in 2006"  . Alcohol Use: No  . Drug Use: No  . Sexual Activity: Yes   Other Topics Concern  . Not on file   Social History Narrative   Lives in [place] with wife.  Works @ [employer] in [employer].  Not routinely exercising.    Family History: Family History  Problem Relation Age of Onset  . Hypertension Mother   . Diabetes Mother   . Hypertension Father   . Diabetes Father   . Diabetes Brother   . Kidney disease Brother   . Diabetes Brother   . Heart failure Mother     Died @ 7  . Heart failure Father     Died @ 33  . Other Sister     4 sisters A&W  . Heart attack Paternal Grandfather   . Stroke Paternal Grandmother     Review of Systems: All other systems reviewed and are otherwise negative except as noted above.   Physical Exam: VS:  BP 110/80 mmHg  Pulse 88  Ht 6\' 4"  (1.93 m)  Wt 294 lb 14.4 oz (133.766 kg)  BMI 35.91 kg/m2 , BMI Body mass index is 35.91 kg/(m^2).  GEN- The patient is obese appearing, alert and oriented x 3 today.   HEENT: normocephalic, atraumatic; sclera clear, conjunctiva pink; hearing intact; oropharynx clear; neck  supple  Lungs- Clear to ausculation bilaterally, normal work of breathing.  No wheezes, rales, rhonchi Heart- Regular rate and rhythm, no murmurs, rubs or gallops  GI- soft, non-tender, non-distended, bowel sounds present  Extremities- no clubbing, cyanosis, or edema; DP/PT/radial pulses 2+ bilaterally MS- no significant deformity or atrophy Skin- warm and dry, no rash or lesion; ICD pocket well healed Psych- euthymic mood, full affect Neuro- strength and  sensation are intact  ICD interrogation- reviewed in detail today,  See PACEART report  EKG:  EKG is not ordered today.  Recent Labs: 04/19/2014: TSH 1.227 03/17/2015: ALT 34; B Natriuretic Peptide 55.9; BUN 21*; Creatinine, Ser 0.83; Hemoglobin 15.4; Magnesium 2.0; Platelets 180; Potassium 4.0; Sodium 137   Wt Readings from Last 3 Encounters:  03/26/15 294 lb 14.4 oz (133.766 kg)  03/13/15 304 lb (137.893 kg)  12/03/14 288 lb 8 oz (130.863 kg)     Other studies Reviewed: Additional studies/ records that were reviewed today include: hospital records  Assessment and Plan:  1. Persistent atrial fibrillation Maintaining SR post DCCV  Continue Xarelto for CHADS2VASC of 3  Continue Tikosyn for now.  Recent BMET, Mg, QTc stable If he has recurrent AF despite Tikosyn again, will need to consider alternative AAD therapy - will ask research to screen for Genetic AF study.  He is willing to consider if he has the gene.  I do not think not sure that he is an ablation candidate at this time with weight  2. Morbid Obesity Weight loss advised  3. NICM/chronic systolic heart failure Euvolemic on exam Continue current therapy Normal ICD function See Pace Art report No changes today  4. HTN Stable No change required today  5. OSA Compliance with CPAP encouraged (he wears nightly)   Current medicines are reviewed at length with the patient today.   The patient does not have concerns regarding his medicines.  The following  changes were made today:  none  Labs/ tests ordered today include: none   Disposition:   Follow up with Delilah Shan, Dr Aaron Thompson as scheduled, Dr [name] 6 months (pt would like to re-establish with general cards)   Signed, [name], NP 03/26/2015 11:26 AM  Aaron Thompson [address] [place] [address] [phone] (office) [phone] (fax)

## 2015-03-26 NOTE — Telephone Encounter (Signed)
Discussed with patient

## 2015-03-26 NOTE — Patient Instructions (Addendum)
Medication Instructions:   Your physician recommends that you continue on your current medications as directed. Please refer to the Current Medication list given to you today.   If you need a refill on your cardiac medications before your next appointment, please call your pharmacy.  Labwork: NONE ORDER TODAY   Testing/Procedures: NONE ORDER TODAY    Follow-Up: Your physician wants you to follow-up in:  IN  [place] will receive a reminder letter in the mail two months in advance. If you don't receive a letter, please call our office to schedule the follow-up appointment.  Your physician wants you to follow-up in: [place] will receive a reminder letter in the mail two months in advance. If you don't receive a letter, please call our office to schedule the follow-up appointment.     Remote monitoring is used to monitor your Pacemaker of ICD from home. This monitoring reduces the number of office visits required to check your device to one time per year. It allows [place] to keep an eye on the functioning of your device to ensure it is working properly. You are scheduled for a device check from home on 06/26/15.Marland KitchenMarland KitchenMarland KitchenYou may send your transmission at any time that day. If you have a wireless device, the transmission will be sent automatically. After your physician reviews your transmission, you will receive a postcard with your next transmission date.     Any Other Special Instructions Will Be Listed Below (If Applicable).

## 2015-03-27 ENCOUNTER — Other Ambulatory Visit: Payer: Self-pay

## 2015-03-27 MED ORDER — GLUCOSE BLOOD VI STRP: ORAL_STRIP | Status: DC

## 2015-03-27 NOTE — Telephone Encounter (Signed)
V/m left requesting refill for onetouch ultra test strips to [facility]. Advised done per protocol.

## 2015-04-07 ENCOUNTER — Other Ambulatory Visit (INDEPENDENT_AMBULATORY_CARE_PROVIDER_SITE_OTHER): Payer: BLUE CROSS/BLUE SHIELD

## 2015-04-07 DIAGNOSIS — E785 Hyperlipidemia, unspecified: Secondary | ICD-10-CM

## 2015-04-07 DIAGNOSIS — IMO0001 Reserved for inherently not codable concepts without codable children: Secondary | ICD-10-CM

## 2015-04-07 DIAGNOSIS — E1165 Type 2 diabetes mellitus with hyperglycemia: Secondary | ICD-10-CM | POA: Diagnosis not present

## 2015-04-07 DIAGNOSIS — IMO0002 Reserved for concepts with insufficient information to code with codable children: Secondary | ICD-10-CM

## 2015-04-07 LAB — COMPREHENSIVE METABOLIC PANEL
ALBUMIN: 4.1 g/dL (ref 3.5–5.2)
ALK PHOS: 45 U/L (ref 39–117)
ALT: 23 U/L (ref 0–53)
AST: 20 U/L (ref 0–37)
BUN: 13 mg/dL (ref 6–23)
CO2: 30 mEq/L (ref 19–32)
CREATININE: 0.73 mg/dL (ref 0.40–1.50)
Calcium: 9.1 mg/dL (ref 8.4–10.5)
Chloride: 101 mEq/L (ref 96–112)
GFR: 119.24 mL/min (ref >60.00)
GLUCOSE: 190 mg/dL — AB (ref 70–99)
POTASSIUM: 4.3 meq/L (ref 3.5–5.1)
SODIUM: 138 meq/L (ref 135–145)
TOTAL PROTEIN: 6.9 g/dL (ref 6.0–8.3)
Total Bilirubin: 0.8 mg/dL (ref 0.2–1.2)

## 2015-04-07 LAB — HEMOGLOBIN A1C: HEMOGLOBIN A1C: 7.4 % — AB (ref 4.6–6.5)

## 2015-04-07 LAB — LIPID PANEL
CHOL/HDL RATIO: 5
Cholesterol: 149 mg/dL (ref 0–200)
HDL: 29.7 mg/dL — AB (ref >39.00)
LDL Cholesterol: 94 mg/dL (ref 0–99)
NONHDL: 119.38
Triglycerides: 129 mg/dL (ref 0.0–149.0)
VLDL: 25.8 mg/dL (ref 0.0–40.0)

## 2015-04-08 ENCOUNTER — Ambulatory Visit (INDEPENDENT_AMBULATORY_CARE_PROVIDER_SITE_OTHER)
Admission: RE | Admit: 2015-04-08 | Discharge: 2015-04-08 | Disposition: A | Discharge status: Home / Self Care | Payer: BLUE CROSS/BLUE SHIELD | Source: Ambulatory Visit | Attending: Family Medicine | Admitting: Family Medicine

## 2015-04-08 ENCOUNTER — Encounter: Payer: Self-pay | Admitting: Family Medicine

## 2015-04-08 ENCOUNTER — Ambulatory Visit (INDEPENDENT_AMBULATORY_CARE_PROVIDER_SITE_OTHER): Payer: BLUE CROSS/BLUE SHIELD | Admitting: Family Medicine

## 2015-04-08 VITALS — BP 128/76 | HR 78 | Temp 98.3°F | Ht 76.0 in | Wt 294.5 lb

## 2015-04-08 DIAGNOSIS — IMO0001 Reserved for inherently not codable concepts without codable children: Secondary | ICD-10-CM

## 2015-04-08 DIAGNOSIS — E785 Hyperlipidemia, unspecified: Secondary | ICD-10-CM

## 2015-04-08 DIAGNOSIS — J209 Acute bronchitis, unspecified: Secondary | ICD-10-CM

## 2015-04-08 DIAGNOSIS — E1165 Type 2 diabetes mellitus with hyperglycemia: Secondary | ICD-10-CM | POA: Diagnosis not present

## 2015-04-08 DIAGNOSIS — R042 Hemoptysis: Secondary | ICD-10-CM

## 2015-04-08 DIAGNOSIS — E669 Obesity, unspecified: Secondary | ICD-10-CM

## 2015-04-08 DIAGNOSIS — I10 Essential (primary) hypertension: Secondary | ICD-10-CM

## 2015-04-08 MED ORDER — ALBUTEROL SULFATE HFA 108 (90 BASE) MCG/ACT IN AERS: 2.0000 | INHALATION_SPRAY | RESPIRATORY_TRACT | Status: DC | PRN

## 2015-04-08 MED ORDER — CEFDINIR 300 MG PO CAPS: 300.0000 mg | ORAL_CAPSULE | Freq: Two times a day (BID) | ORAL | Status: DC

## 2015-04-08 MED ORDER — GUAIFENESIN-CODEINE 100-10 MG/5ML PO SYRP: 5.0000 mL | ORAL_SOLUTION | Freq: Four times a day (QID) | ORAL | Status: DC | PRN

## 2015-04-08 NOTE — Assessment & Plan Note (Signed)
Discussed how this problem influences overall health and the risks it imposes  Reviewed plan for weight loss with lower calorie diet (via better food choices and also portion control or program like weight watchers) and exercise building up to or more than 30 minutes 5 days per week including some aerobic activity    Down 10 lb since feb-this may be due to fluid loss from hospital/ CHF-but also working on diet and exercise  Commended  Enc to keep it up  Low salt diet as well  F/u 3 mo

## 2015-04-08 NOTE — Progress Notes (Signed)
Pre visit review using our clinic review tool, if applicable. No additional management support is needed unless otherwise documented below in the visit note. 

## 2015-04-08 NOTE — Assessment & Plan Note (Signed)
Lab Results  Component Value Date   HGBA1C 7.4* 04/07/2015   This is down from 8.8 with better diet and exercise Has done DM teaching utd on eye exam On ace  Enc further wt loss  F/u 3 mo with lab prior

## 2015-04-08 NOTE — Assessment & Plan Note (Signed)
Prod cough- 8 d with some hemoptysis (on xarelto)--- and recently in hospital/exp to hosp bugs  cxr now  Cover with cefdinir 300 bid for 7 d Disc symptomatic care - see instructions on AVS  Robitussin AC for cough with caution of sedation  Albuterol for wheeze (reassuring exam)  Update if not starting to improve in a week or if worsening  -esp if wheezing

## 2015-04-08 NOTE — Patient Instructions (Signed)
Chest xray today -I think it will look ok  Take the cefdinir for bronchitis  Fluids/rest Robitussin AC for cough - with caution of sedation  Use the inhaler as needed for wheezing   Update if not starting to improve in a week or if worsening  -especially if more wheezing   A1C is improved at 7.4   (goal is to get it under 7) Your HDL is low (good cholesterol) - keep walking/this helps (exercise) Keep working on weight loss  Keep working on diabetic diet   Follow up in 3 month with labs prior

## 2015-04-08 NOTE — Assessment & Plan Note (Signed)
bp in fair control at this time  BP Readings from Last 1 Encounters:  04/08/15 128/76   No changes needed Disc lifstyle change with low sodium diet and exercise  Doing better since released from the hospital  Labs reviewed

## 2015-04-08 NOTE — Assessment & Plan Note (Signed)
On xarelto - and coughing hard Never smoker  cxr now  Expect benign

## 2015-04-08 NOTE — Assessment & Plan Note (Signed)
Low HDL is the major problem Disc goals for lipids and reasons to control them Rev labs with pt Rev low sat fat diet in detail Disc inc exercise for HDL  May benefit from omega 3 in the future

## 2015-04-08 NOTE — Progress Notes (Signed)
Subjective:    Patient ID: Aaron Thompson, male    DOB: 01-16-1961, 55 y.o.   MRN: [id number]  HPI Here for 8 d of uri symptoms and also for f/u of chronic health problems   Coughing and wheezing He often wheezes with uris  Productive =phlegm is green and brown to green  Chest and ribs hurt  Keeping him up   Is also blowing green d/c from nose  Some sinus pressure  Ears feel blocked up    Coughed up a little blood - he is on a blood thinner  Never smoked   No high fever or body aches   Takes chlorcedin hbp - takes 1 instead of 2   Still struggling with a fib - his defibrillator went off again - was recently in the hospital  Is doing better now   DM2 A1C is down to 7.7(recent in hosp) and 8.8 prior to that  Eating better -cutting back on carbs Also walking regularly when he can  Labile bs at home - 120s-150s in general- but this draw it was 200 Has done DM teaching -knows what to eat  Wt is down 10 lb since feb bmi is 35-still in obese range   Cholesterol  No meds Lab Results  Component Value Date   CHOL 149 04/07/2015   HDL 29.70* 04/07/2015   LDLCALC 94 04/07/2015   LDLDIRECT 109.1 02/01/2013   TRIG 129.0 04/07/2015   CHOLHDL 5 04/07/2015   Will work on HDL with exercise - starting to walk regularly   bp is stable today  No cp or palpitations or headaches or edema  No side effects to medicines  BP Readings from Last 3 Encounters:  04/08/15 128/76  03/26/15 110/80  03/17/15 120/82       Chemistry      Component Value Date/Time   NA 138 04/07/2015 0834   K 4.3 04/07/2015 0834   CL 101 04/07/2015 0834   CO2 30 04/07/2015 0834   BUN 13 04/07/2015 0834   CREATININE 0.73 04/07/2015 0834   CREATININE 0.83 03/27/2013 1730      Component Value Date/Time   CALCIUM 9.1 04/07/2015 0834   ALKPHOS 45 04/07/2015 0834   AST 20 04/07/2015 0834   ALT 23 04/07/2015 0834   BILITOT 0.8 04/07/2015 0834      Overall stable    Patient Active Problem List   Diagnosis Date Noted  . Inappropriate shocks from ICD (implantable cardioverter-defibrillator) 03/17/2015  . Hearing loss of right ear due to cerumen impaction 03/13/2015  . Obesity 12/05/2014  . Osteoarthritis of both hands 07/31/2014  . Low serum potassium level 04/21/2014  . ICD (implantable cardioverter-defibrillator) discharge 04/19/2014  . Atrial fibrillation with RVR ([place]) 04/18/2014  . Chronic systolic heart failure ([place]) 02/14/2013  . Hyperlipidemia 02/05/2013  . Diabetes type 2, uncontrolled ([place]) 09/30/2011  . Nonischemic cardiomyopathy ([place]) 08/18/2011  . NSVT (nonsustained ventricular tachycardia) ([place]) 08/18/2011  . Chest pain on exertion 08/17/2011  . Unstable angina ([place]) 08/17/2011  . Type II or unspecified type diabetes mellitus without mention of complication, not stated as uncontrolled 10/06/2010  . Palpitations 05/12/2010  . CARDIOMYOPATHY OTHER DISEASES CLASSIFIED ELSW 07/16/2009  . COLONIC POLYPS 12/09/2008  . OSTEOARTHROSIS UNSPEC WHETHER GEN/LOCALIZED HAND 08/16/2008  . Essential hypertension 05/11/2007  . History of renal calculi 05/11/2007  . FATTY LIVER DISEASE 03/20/2007  . ANXIETY 10/04/2006  . HEARING IMPAIRMENT 10/04/2006  . Atrial fibrillation ([place]) 10/04/2006  . FOLLICULITIS [id number]  .  GERD 09/20/2006   Past Medical History  Diagnosis Date  . Hypertension   . DJD (degenerative joint disease)   . Dermatophytosis of the body   . Esophageal reflux   . Other chronic nonalcoholic liver disease     fatty liver  . Unspecified hearing loss   . Anxiety state, unspecified   . Non-ischemic cardiomyopathy ([place])     a.  cath 6/11: OM2 30%; EF 30%;  b. echo 2/12: EF 30%, mild MR, mild LAE;  c.  Echo (11/14):  Mod LVH, EF 25-30%, diff HK, mod LAE.   [name] PAF (paroxysmal atrial fibrillation) ([place])     a. s/p DCCV 6/11; previously on Pradaxa;  b. Event Monitor 2012->No PAF;  c. 09/2011 s/p DCCV ->Xarelto started. d. 03/2014: inappropriate ICD shocks for  AF-RVR, started on Tikosyn, Xarelto restarted.  [name] LBBB (left bundle branch block)     intermittent  . Hx of colonic polyps   . Osteoarthrosis, unspecified whether generalized or localized, hand   . Chronic systolic CHF (congestive heart failure) ([place])   . OSA on CPAP   . Type II diabetes mellitus ([place])   . Calculus of kidney 1981    "passed it on my own"   Past Surgical History  Procedure Laterality Date  . Neck surgery      plate/fusion  . Bilateral knee arthroscopy    . Stress cardiolite  08/2005    Normal, EF 42%  . Upper gastrointestinal endoscopy  08/2006    GERD  . Abdominal US  01/2007    Fatty liver, no gallstones  . Doppler echocardiography  11/2009    Decreased EF of 35-40%  . Appendectomy    . Cardioversion  09/29/2011    Procedure: CARDIOVERSION;  Surgeon: Thayer Headings, MD;  Location: [place] [facility];  Service: Cardiovascular;  Laterality: [name];  . Left heart catheterization with coronary angiogram [name] 08/18/2011    Procedure: LEFT HEART CATHETERIZATION WITH CORONARY ANGIOGRAM;  Surgeon: Peter M Martinique, MD;  Location: [facility] CATH LAB;  Service: Cardiovascular;  Laterality: [name];  . Bi-ventricular implantable cardioverter defibrillator [name] 02/14/2013    STJ CRTD implanted by Dr Caryl Comes   Social History  Substance Use Topics  . Smoking status: Never Smoker   . Smokeless tobacco: Former Systems developer    Types: Snuff     Comment: 02/14/2013 "quit snuff in 2006"  . Alcohol Use: No   Family History  Problem Relation Age of Onset  . Hypertension Mother   . Diabetes Mother   . Hypertension Father   . Diabetes Father   . Diabetes Brother   . Kidney disease Brother   . Diabetes Brother   . Heart failure Mother     Died @ 41  . Heart failure Father     Died @ 8  . Other Sister     4 sisters A&W  . Heart attack Paternal Grandfather   . Stroke Paternal Grandmother    No Known Allergies Current Outpatient Prescriptions on File Prior to Visit  Medication Sig Dispense Refill  .  carvedilol (COREG) 25 MG tablet TAKE 1 AND 1/2 TABLETS (37.5 MG TOTAL) BY MOUTH 2 (TWO) TIMES DAILY WITH A MEAL. 270 tablet 1  . dofetilide (TIKOSYN) 500 MCG capsule Take 1 capsule (500 mcg total) by mouth 2 (two) times daily. 60 capsule 6  . glipiZIDE (GLUCOTROL XL) 5 MG 24 hr tablet Take 1 tablet (5 mg total) by mouth daily with breakfast. 30 tablet 11  .  glucose blood (ONE TOUCH ULTRA TEST) test strip CHECK GLUCOSE TWICE DAILY AND AS NEEDED, DX. E11.65 (UNCONTROLLED DM) 100 each 5  . losartan (COZAAR) 50 MG tablet Take 1 tablet (50 mg total) by mouth daily. 30 tablet 5  . metFORMIN (GLUCOPHAGE) 500 MG tablet Take 1 tablet (500 mg total) by mouth 2 (two) times daily with a meal. 60 tablet 11  . Multiple Vitamin (MULTIVITAMIN) tablet Take 1 tablet by mouth daily.      [name] Bicarbonate (ZEGERID OTC) 20-1100 MG CAPS Take 1 capsule by mouth daily.     . potassium chloride SA (KLOR-CON M20) 20 MEQ tablet Take 2 tablets (40 mEq total) by mouth daily. 60 tablet 11  . promethazine (PHENERGAN) 25 MG tablet Take 1 tablet (25 mg total) by mouth every 8 (eight) hours as needed for nausea or vomiting. 30 tablet 0  . rivaroxaban (XARELTO) 20 MG TABS tablet Take 1 tablet (20 mg total) by mouth daily with supper. 30 tablet 2  . spironolactone (ALDACTONE) 25 MG tablet Take 1 tablet (25 mg total) by mouth daily. 30 tablet 8  . traMADol (ULTRAM) 50 MG tablet Take 1 tablet (50 mg total) by mouth every 8 (eight) hours as needed for moderate pain or severe pain. 30 tablet 0   No current facility-administered medications on file prior to visit.      Review of Systems  Constitutional: Positive for appetite change and fatigue. Negative for fever.  HENT: Positive for congestion, postnasal drip, rhinorrhea, sinus pressure, sneezing and sore throat. Negative for ear pain.   Eyes: Negative for pain and discharge.  Respiratory: Positive for cough. Negative for shortness of breath, wheezing and stridor.     Cardiovascular: Negative for chest pain.  Gastrointestinal: Negative for nausea, vomiting and diarrhea.  Genitourinary: Negative for urgency, frequency and hematuria.  Musculoskeletal: Negative for myalgias and arthralgias.  Skin: Negative for rash.  Neurological: Positive for headaches. Negative for dizziness, weakness and light-headedness.  Psychiatric/Behavioral: Negative for confusion and dysphoric mood.       Objective:   Physical Exam  Constitutional: He appears well-developed and well-nourished. No distress.  obese and well appearing   HENT:  Head: Normocephalic and atraumatic.  Right Ear: External ear normal.  Left Ear: External ear normal.  Mouth/Throat: Oropharynx is clear and moist.  Nares are injected and congested  Mild frontal sinus tenderness bilateral  Clear rhinorrhea and post nasal drip   Eyes: Conjunctivae and EOM are normal. Pupils are equal, round, and reactive to light. Right eye exhibits no discharge. Left eye exhibits no discharge.  Neck: Normal range of motion. Neck supple.  Cardiovascular: Normal rate and normal heart sounds.   Pulmonary/Chest: Effort normal. No respiratory distress. He has wheezes. He has no rales. He exhibits no tenderness.  Harsh bs Scant wheeze only on forced exp Upper airway sounds Good air exch  Junky cough  Abdominal: Soft. Bowel sounds are normal. He exhibits no distension and no abdominal bruit. There is no tenderness.  Musculoskeletal: He exhibits no edema.  Lymphadenopathy:    He has no cervical adenopathy.  Neurological: He is alert. No cranial nerve deficit.  Skin: Skin is warm and dry. No rash noted. No pallor.  Psychiatric: He has a normal mood and affect.          Assessment & Plan:   Problem List Items Addressed This Visit      Cardiovascular and Mediastinum   Essential hypertension    bp in fair control  at this time  BP Readings from Last 1 Encounters:  04/08/15 128/76   No changes needed Disc  lifstyle change with low sodium diet and exercise  Doing better since released from the hospital  Labs reviewed         Respiratory   Acute bronchitis - Primary    Prod cough- 8 d with some hemoptysis (on xarelto)--- and recently in hospital/exp to hosp bugs  cxr now  Cover with cefdinir 300 bid for 7 d Disc symptomatic care - see instructions on AVS  Robitussin AC for cough with caution of sedation  Albuterol for wheeze (reassuring exam)  Update if not starting to improve in a week or if worsening  -esp if wheezing       Relevant Orders   DG Chest 2 View   Hemoptysis    On xarelto - and coughing hard Never smoker  cxr now  Expect benign      Relevant Orders   DG Chest 2 View     Endocrine   Diabetes type 2, uncontrolled ([place])    Lab Results  Component Value Date   HGBA1C 7.4* 04/07/2015   This is down from 8.8 with better diet and exercise Has done DM teaching utd on eye exam On ace  Enc further wt loss  F/u 3 mo with lab prior         Other   Hyperlipidemia    Low HDL is the major problem Disc goals for lipids and reasons to control them Rev labs with pt Rev low sat fat diet in detail Disc inc exercise for HDL  May benefit from omega 3 in the future         Obesity    Discussed how this problem influences overall health and the risks it imposes  Reviewed plan for weight loss with lower calorie diet (via better food choices and also portion control or program like weight watchers) and exercise building up to or more than 30 minutes 5 days per week including some aerobic activity    Down 10 lb since feb-this may be due to fluid loss from hospital/ CHF-but also working on diet and exercise  Commended  Enc to keep it up  Low salt diet as well  F/u 3 mo

## 2015-04-11 ENCOUNTER — Ambulatory Visit: Payer: Self-pay | Admitting: Family Medicine

## 2015-04-14 ENCOUNTER — Telehealth: Payer: Self-pay

## 2015-04-14 NOTE — Telephone Encounter (Signed)
PLEASE NOTE: All timestamps contained within this report are represented as [place] Standard Time. CONFIDENTIALTY NOTICE: This fax transmission is intended only for the addressee. It contains information that is legally privileged, confidential or otherwise protected from use or disclosure. If you are not the intended recipient, you are strictly prohibited from reviewing, disclosing, copying using or disseminating any of this information or taking any action in reliance on or regarding this information. If you have received this fax in error, please notify us immediately by telephone so that we can arrange for its return to [place]. Phone: [phone], Toll-Free: [phone], Fax: [phone] Page: 1 of 2 Call Id: [id number] [place] Patient Name: Aaron Thompson Gender: Male DOB: May 21, 1960 Age: 55 Y 87 M 21 D Return Phone Number: [phone] (Primary), [phone] (Secondary) Address: City/State/Zip: [place] - Client Client Site [facility], [place] Contact Type Call Who Is Calling Patient / Member / Family / Caregiver Call Type Triage / Clinical Relationship To Patient Self Return Phone Number Please choose phone number Chief Complaint Cough Reason for Call Symptomatic / Request for [place] states that he is on antibiotics, still has a cough PreDisposition Call Doctor Translation No Nurse Assessment Nurse: Mullen Desanctis, RN, Amy Date/Time (Eastern Time): 04/11/2015 6:04:37 PM Confirm and document reason for call. If symptomatic, describe symptoms. You must click the next button to save text entered. ---Caller states that he is on antibiotics, still has a cough. Started on Tuesday (unknown antibiotic). No fever. Has the patient traveled out of the country within the last  30 days? ---No Does the patient have any new or worsening symptoms? ---Yes Will a triage be completed? ---Yes Related visit to physician within the last 2 weeks? ---Yes Does the PT have any chronic conditions? (i.e. diabetes, asthma, etc.) ---Yes List chronic conditions. ---pacemaker, chf heart functions at 15 % Is this a behavioral health or substance abuse call? ---No Guidelines Guideline Title Affirmed Question Affirmed Notes Nurse Date/Time (Eastern Time) Cough - Acute Productive [1] Coughed up bloodtinged sputum AND [2] more than once [place], RN, Amy 04/11/2015 6:08:17 PM Disp. Time Eilene Ghazi Time) Disposition Final User 04/11/2015 6:15:30 PM See PCP When Office is Open (within 3 days) Yes [place], RN, Amy PLEASE NOTE: All timestamps contained within this report are represented as [place] Standard Time. CONFIDENTIALTY NOTICE: This fax transmission is intended only for the addressee. It contains information that is legally privileged, confidential or otherwise protected from use or disclosure. If you are not the intended recipient, you are strictly prohibited from reviewing, disclosing, copying using or disseminating any of this information or taking any action in reliance on or regarding this information. If you have received this fax in error, please notify us immediately by telephone so that we can arrange for its return to [place]. Phone: [phone], Toll-Free: [phone], Fax: [phone] Page: 2 of 2 Call Id: [id number] Caller Understands: Yes Disagree/Comply: Comply Care Advice Given Per Guideline SEE PCP WITHIN 3 DAYS: * You need to be seen within 2 or 3 days. Call your doctor during regular office hours and make an appointment. An urgent care center is often the best source of care if your doctor's office is closed or you can't get an appointment. NOTE: If office will be open tomorrow, tell caller to call then, not in 3 days. HUMIDIFIER: If the air is dry, use  a humidifier in  the bedroom. (Reason: dry air makes coughs worse) AVOID TOBACCO SMOKE: Smoking or being exposed to smoke makes coughs much worse. CALL BACK IF: * Fever over 103 F (39.4 C) * Difficulty breathing occurs * You become worse. CARE ADVICE given per Cough - Acute Productive (Adult) guideline. BLOOD TINGED SPUTUM: * This usually represents irritation of the airways from inflammation or infection. * Mild streaks or spots of blood in someone who has a cold or bronchitis is usually not serious. - HOME REMEDY - HARD CANDY: Hard candy works just as well as medicine-flavored OTC cough drops. Diabetics should use sugar-free candy. - HOME REMEDY - HONEY: This old home remedy has been shown to help decrease coughing at night. The adult dosage is 2 teaspoons (10 ml) at bedtime. Honey should not be given to infants under one year of age. Comments User: Laurence Ferrari, RN Date/Time Eilene Ghazi Time): 04/11/2015 6:16:37 PM States he has some improvement since seen in the office 3 days ago but says he thought he would be better by now. Referrals GO TO FACILITY UNDECIDED

## 2015-04-14 NOTE — Telephone Encounter (Signed)
Left voicemail letting pt know to schedule appt if no improvement or sxs worsening

## 2015-04-14 NOTE — Telephone Encounter (Signed)
If he is not improving this week/or worse at any time-please make a f/u appt with [facility]  thanks

## 2015-04-17 ENCOUNTER — Other Ambulatory Visit: Payer: Self-pay | Admitting: Nurse Practitioner

## 2015-04-17 ENCOUNTER — Encounter: Payer: Self-pay | Admitting: *Deleted

## 2015-04-17 DIAGNOSIS — Z006 Encounter for examination for normal comparison and control in clinical research program: Secondary | ICD-10-CM

## 2015-04-17 NOTE — Progress Notes (Signed)
Subject signed consent and is being screened for Genetic AF.  Waiting for lab results.

## 2015-06-05 ENCOUNTER — Ambulatory Visit (INDEPENDENT_AMBULATORY_CARE_PROVIDER_SITE_OTHER): Payer: BLUE CROSS/BLUE SHIELD | Admitting: *Deleted

## 2015-06-05 ENCOUNTER — Telehealth: Payer: Self-pay | Admitting: Cardiology

## 2015-06-05 DIAGNOSIS — I428 Other cardiomyopathies: Secondary | ICD-10-CM

## 2015-06-05 DIAGNOSIS — I5022 Chronic systolic (congestive) heart failure: Secondary | ICD-10-CM | POA: Diagnosis not present

## 2015-06-05 DIAGNOSIS — I429 Cardiomyopathy, unspecified: Secondary | ICD-10-CM

## 2015-06-05 NOTE — Telephone Encounter (Signed)
LMOVM reminding pt to send remote transmission.   

## 2015-06-06 NOTE — Progress Notes (Signed)
Remote ICD transmission.   

## 2015-06-12 ENCOUNTER — Ambulatory Visit (INDEPENDENT_AMBULATORY_CARE_PROVIDER_SITE_OTHER): Payer: BLUE CROSS/BLUE SHIELD | Admitting: Family Medicine

## 2015-06-12 ENCOUNTER — Encounter: Payer: Self-pay | Admitting: Family Medicine

## 2015-06-12 VITALS — BP 130/86 | HR 90 | Temp 97.9°F | Wt 298.2 lb

## 2015-06-12 DIAGNOSIS — J200 Acute bronchitis due to Mycoplasma pneumoniae: Secondary | ICD-10-CM | POA: Diagnosis not present

## 2015-06-12 MED ORDER — DOXYCYCLINE HYCLATE 100 MG PO TABS: 100.0000 mg | ORAL_TABLET | Freq: Two times a day (BID) | ORAL | Status: DC

## 2015-06-12 NOTE — Progress Notes (Signed)
SUBJECTIVE:  Aaron Thompson is a 55 y.o. male pt of Dr. Glori Bickers, new to me, who complains of coryza, congestion, productive cough and blood tinged mucous for 7 days. He denies a history of anorexia and chest pain and admits to a history of asthma. Patient denies smoke cigarettes.  Of note, treated for bronchitis in 03/2015 with cedfinir.  Complained of hemoptysis at that time- neg CXR:  CLINICAL DATA: Bronchitis, cough, hemoptysis  EXAM: CHEST 2 VIEW  COMPARISON: 03/16/2015  FINDINGS: Left AICD remains in place, unchanged. Heart and mediastinal contours are within normal limits. No focal opacities or effusions. No acute bony abnormality. Degenerative spurring in the thoracic spine.  IMPRESSION: No active cardiopulmonary disease.   Current Outpatient Prescriptions on File Prior to Visit  Medication Sig Dispense Refill  . albuterol (PROVENTIL HFA;VENTOLIN HFA) 108 (90 Base) MCG/ACT inhaler Inhale 2 puffs into the lungs every 4 (four) hours as needed for wheezing or shortness of breath. 1 Inhaler 1  . carvedilol (COREG) 25 MG tablet TAKE 1 AND 1/2 TABLETS (37.5 MG TOTAL) BY MOUTH 2 (TWO) TIMES DAILY WITH A MEAL. 270 tablet 1  . dofetilide (TIKOSYN) 500 MCG capsule Take 1 capsule (500 mcg total) by mouth 2 (two) times daily. 60 capsule 6  . glipiZIDE (GLUCOTROL XL) 5 MG 24 hr tablet Take 1 tablet (5 mg total) by mouth daily with breakfast. 30 tablet 11  . glucose blood (ONE TOUCH ULTRA TEST) test strip CHECK GLUCOSE TWICE DAILY AND AS NEEDED, DX. E11.65 (UNCONTROLLED DM) 100 each 5  . losartan (COZAAR) 50 MG tablet Take 1 tablet (50 mg total) by mouth daily. 30 tablet 5  . metFORMIN (GLUCOPHAGE) 500 MG tablet Take 1 tablet (500 mg total) by mouth 2 (two) times daily with a meal. 60 tablet 11  . Multiple Vitamin (MULTIVITAMIN) tablet Take 1 tablet by mouth daily.      [name] Bicarbonate (ZEGERID OTC) 20-1100 MG CAPS Take 1 capsule by mouth daily.     . potassium chloride  SA (KLOR-CON M20) 20 MEQ tablet Take 2 tablets (40 mEq total) by mouth daily. 60 tablet 11  . rivaroxaban (XARELTO) 20 MG TABS tablet Take 1 tablet (20 mg total) by mouth daily with supper. 30 tablet 2  . spironolactone (ALDACTONE) 25 MG tablet Take 1 tablet (25 mg total) by mouth daily. 30 tablet 8   No current facility-administered medications on file prior to visit.    No Known Allergies  Past Medical History  Diagnosis Date  . Hypertension   . DJD (degenerative joint disease)   . Dermatophytosis of the body   . Esophageal reflux   . Other chronic nonalcoholic liver disease     fatty liver  . Unspecified hearing loss   . Anxiety state, unspecified   . Non-ischemic cardiomyopathy ([place])     a.  cath 6/11: OM2 30%; EF 30%;  b. echo 2/12: EF 30%, mild MR, mild LAE;  c.  Echo (11/14):  Mod LVH, EF 25-30%, diff HK, mod LAE.   [name] PAF (paroxysmal atrial fibrillation) ([place])     a. s/p DCCV 6/11; previously on Pradaxa;  b. Event Monitor 2012->No PAF;  c. 09/2011 s/p DCCV ->Xarelto started. d. 03/2014: inappropriate ICD shocks for AF-RVR, started on Tikosyn, Xarelto restarted.  [name] LBBB (left bundle branch block)     intermittent  . Hx of colonic polyps   . Osteoarthrosis, unspecified whether generalized or localized, hand   . Chronic systolic CHF (congestive heart  failure) ([place])   . OSA on CPAP   . Type II diabetes mellitus ([place])   . Calculus of kidney 1981    "passed it on my own"    Past Surgical History  Procedure Laterality Date  . Neck surgery      plate/fusion  . Bilateral knee arthroscopy    . Stress cardiolite  08/2005    Normal, EF 42%  . Upper gastrointestinal endoscopy  08/2006    GERD  . Abdominal US  01/2007    Fatty liver, no gallstones  . Doppler echocardiography  11/2009    Decreased EF of 35-40%  . Appendectomy    . Cardioversion  09/29/2011    Procedure: CARDIOVERSION;  Surgeon: Thayer Headings, MD;  Location: [facility];  Service: Cardiovascular;  Laterality: [name];  .  Left heart catheterization with coronary angiogram [name] 08/18/2011    Procedure: LEFT HEART CATHETERIZATION WITH CORONARY ANGIOGRAM;  Surgeon: Peter M Martinique, MD;  Location: [facility] CATH LAB;  Service: Cardiovascular;  Laterality: [name];  . Bi-ventricular implantable cardioverter defibrillator [name] 02/14/2013    STJ CRTD implanted by Dr Caryl Comes    Family History  Problem Relation Age of Onset  . Hypertension Mother   . Diabetes Mother   . Hypertension Father   . Diabetes Father   . Diabetes Brother   . Kidney disease Brother   . Diabetes Brother   . Heart failure Mother     Died @ 43  . Heart failure Father     Died @ 61  . Other Sister     4 sisters A&W  . Heart attack Paternal Grandfather   . Stroke Paternal Grandmother     Social History   Social History  . Marital Status: Married    Spouse Name: [name]  . Number of Children: 2  . Years of Education: [name]   Occupational History  . PAINTER Unemployed   Social History Main Topics  . Smoking status: Never Smoker   . Smokeless tobacco: Former Systems developer    Types: Snuff     Comment: 02/14/2013 "quit snuff in 2006"  . Alcohol Use: No  . Drug Use: No  . Sexual Activity: Yes   Other Topics Concern  . Not on file   Social History Narrative   Lives in [place] with wife.  Works @ [employer] in [employer].  Not routinely exercising.   The PMH, PSH, Social History, Family History, Medications, and allergies have been reviewed in [facility], and have been updated if relevant.  OBJECTIVE: BP 130/86 mmHg  Pulse 90  Temp(Src) 97.9 F (36.6 C) (Oral)  Wt 298 lb 4 oz (135.285 kg)  SpO2 97%  He appears well, vital signs are as noted. Ears normal.  Throat and pharynx normal.  Neck supple. No adenopathy in the neck. Nose is congested. Sinuses non tender. Scattered wheezes  ASSESSMENT:  bronchitis  PLAN: He is on tikosyn and xarelto- advised that we need to take medications with caution and look at for possible drug interactions. eRx sent  for doxycyline 100 mg twice daily x 10 days. Symptomatic therapy suggested: push fluids, rest and return office visit prn if symptoms persist or worsen. Call or return to clinic prn if these symptoms worsen or fail to improve as anticipated.

## 2015-06-12 NOTE — Progress Notes (Signed)
Pre visit review using our clinic review tool, if applicable. No additional management support is needed unless otherwise documented below in the visit note. 

## 2015-06-12 NOTE — Patient Instructions (Addendum)
It was great to meet you. Take the doxycycline for bronchitis  Fluids/rest Robitussin AC for cough - with caution of sedation  Use the inhaler as needed for wheezing

## 2015-06-18 ENCOUNTER — Encounter: Payer: Self-pay | Admitting: *Deleted

## 2015-06-18 DIAGNOSIS — Z006 Encounter for examination for normal comparison and control in clinical research program: Secondary | ICD-10-CM

## 2015-06-18 NOTE — Progress Notes (Signed)
Late entry:    Genetic AF Informed Consent  Subject Name: Aaron Thompson  Subject met inclusion and exclusion criteria. The informed consent form, study requirements and expectations were reviewed with the subject and questions and concerns were addressed prior to the signing of the consent form. The subject verbalized understanding of the trial requirements. The subject agreed to participate in the Genetic AF trial and signed the informed consent. The informed consent was obtained prior to performance of any protocol-specific procedures for the subject. A copy of the signed informed consent was given to the subject and a copy was placed in the subject's medical record.   Jake Bathe, RN 04/15/2015 1440

## 2015-06-30 ENCOUNTER — Encounter: Payer: Self-pay | Admitting: Family Medicine

## 2015-06-30 ENCOUNTER — Ambulatory Visit (INDEPENDENT_AMBULATORY_CARE_PROVIDER_SITE_OTHER): Payer: BLUE CROSS/BLUE SHIELD | Admitting: Family Medicine

## 2015-06-30 VITALS — BP 134/90 | HR 94 | Temp 98.4°F | Ht 76.0 in | Wt 295.5 lb

## 2015-06-30 DIAGNOSIS — B9789 Other viral agents as the cause of diseases classified elsewhere: Principal | ICD-10-CM

## 2015-06-30 DIAGNOSIS — J069 Acute upper respiratory infection, unspecified: Secondary | ICD-10-CM | POA: Insufficient documentation

## 2015-06-30 MED ORDER — BENZONATATE 200 MG PO CAPS: 200.0000 mg | ORAL_CAPSULE | Freq: Three times a day (TID) | ORAL | Status: DC | PRN

## 2015-06-30 NOTE — Progress Notes (Signed)
Pre visit review using our clinic review tool, if applicable. No additional management support is needed unless otherwise documented below in the visit note. 

## 2015-06-30 NOTE — Assessment & Plan Note (Signed)
Reassuring exam Keeps getting sick from grand children Disc symptomatic care - see instructions on AVS  Update if not starting to improve in a week or if worsening  - will watch for s/s of sinus infx which he gets frequently

## 2015-06-30 NOTE — Progress Notes (Signed)
Subjective:    Patient ID: Aaron Thompson, male    DOB: Dec 25, 1960, 55 y.o.   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 Viral URI with cough 06/30/2015  . Acute bronchitis 04/08/2015  . Hemoptysis 04/08/2015  . Inappropriate shocks from ICD (implantable cardioverter-defibrillator) 03/17/2015  . Hearing loss of right ear due to cerumen impaction 03/13/2015  . Obesity 12/05/2014  . Osteoarthritis of both hands 07/31/2014  . Low serum potassium level 04/21/2014  . ICD (implantable cardioverter-defibrillator) discharge 04/19/2014  . Atrial fibrillation with RVR ([place]) 04/18/2014  . Chronic systolic heart failure ([place]) 02/14/2013  . Hyperlipidemia 02/05/2013  . Diabetes type 2, uncontrolled ([place]) 09/30/2011  . Nonischemic cardiomyopathy ([place]) 08/18/2011  . NSVT (nonsustained ventricular tachycardia) ([place]) 08/18/2011  . Chest pain on exertion 08/17/2011  . Unstable angina ([place]) 08/17/2011  . Type II or unspecified type diabetes mellitus without mention of complication, not stated as uncontrolled 10/06/2010  . Palpitations 05/12/2010  . CARDIOMYOPATHY OTHER DISEASES CLASSIFIED ELSW 07/16/2009  . COLONIC POLYPS 12/09/2008  . OSTEOARTHROSIS UNSPEC WHETHER GEN/LOCALIZED HAND 08/16/2008  . Essential hypertension 05/11/2007  . History of renal calculi 05/11/2007  . FATTY LIVER DISEASE 03/20/2007  . ANXIETY  10/04/2006  . HEARING IMPAIRMENT 10/04/2006  . Atrial fibrillation ([place]) 10/04/2006  . FOLLICULITIS [id number]  . GERD 09/20/2006   Past Medical History  Diagnosis Date  . Hypertension   . DJD (degenerative joint disease)   . Dermatophytosis of the body   . Esophageal reflux   . Other chronic nonalcoholic liver disease     fatty liver  . Unspecified hearing loss   . Anxiety state, unspecified   . Non-ischemic cardiomyopathy ([place])     a.  cath 6/11: OM2 30%; EF 30%;  b. echo 2/12: EF 30%, mild MR, mild LAE;  c.  Echo (11/14):  Mod LVH, EF 25-30%, diff HK, mod LAE.   [name] PAF (paroxysmal atrial fibrillation) ([place])     a. s/p DCCV 6/11; previously on Pradaxa;  b. Event Monitor 2012->No PAF;  c. 09/2011 s/p DCCV ->Xarelto started. d. 03/2014: inappropriate ICD shocks for AF-RVR, started on Tikosyn, Xarelto restarted.  [name] LBBB (left bundle branch block)     intermittent  . Hx of colonic polyps   . Osteoarthrosis, unspecified whether generalized or localized, hand   . Chronic systolic CHF (congestive heart failure) ([place])   . OSA on CPAP   . Type II diabetes mellitus ([place])   . Calculus of kidney 1981    "passed it on my own"   Past Surgical History  Procedure Laterality Date  . Neck surgery      plate/fusion  . Bilateral knee arthroscopy    . Stress cardiolite  08/2005    Normal, EF 42%  .  Upper gastrointestinal endoscopy  08/2006    GERD  . Abdominal US  01/2007    Fatty liver, no gallstones  . Doppler echocardiography  11/2009    Decreased EF of 35-40%  . Appendectomy    . Cardioversion  09/29/2011    Procedure: CARDIOVERSION;  Surgeon: Thayer Headings, MD;  Location: [facility];  Service: Cardiovascular;  Laterality: [name];  . Left heart catheterization with coronary angiogram [name] 08/18/2011    Procedure: LEFT HEART CATHETERIZATION WITH CORONARY ANGIOGRAM;  Surgeon: Peter M Martinique, MD;  Location: [facility] CATH LAB;  Service: Cardiovascular;  Laterality: [name];  . Bi-ventricular implantable  cardioverter defibrillator [name] 02/14/2013    STJ CRTD implanted by Dr Caryl Comes   Social History  Substance Use Topics  . Smoking status: Never Smoker   . Smokeless tobacco: Former Systems developer    Types: Snuff     Comment: 02/14/2013 "quit snuff in 2006"  . Alcohol Use: No   Family History  Problem Relation Age of Onset  . Hypertension Mother   . Diabetes Mother   . Hypertension Father   . Diabetes Father   . Diabetes Brother   . Kidney disease Brother   . Diabetes Brother   . Heart failure Mother     Died @ 41  . Heart failure Father     Died @ 66  . Other Sister     4 sisters A&W  . Heart attack Paternal Grandfather   . Stroke Paternal Grandmother    No Known Allergies Current Outpatient Prescriptions on File Prior to Visit  Medication Sig Dispense Refill  . albuterol (PROVENTIL HFA;VENTOLIN HFA) 108 (90 Base) MCG/ACT inhaler Inhale 2 puffs into the lungs every 4 (four) hours as needed for wheezing or shortness of breath. 1 Inhaler 1  . carvedilol (COREG) 25 MG tablet TAKE 1 AND 1/2 TABLETS (37.5 MG TOTAL) BY MOUTH 2 (TWO) TIMES DAILY WITH A MEAL. 270 tablet 1  . dofetilide (TIKOSYN) 500 MCG capsule Take 1 capsule (500 mcg total) by mouth 2 (two) times daily. 60 capsule 6  . glipiZIDE (GLUCOTROL XL) 5 MG 24 hr tablet Take 1 tablet (5 mg total) by mouth daily with breakfast. 30 tablet 11  . glucose blood (ONE TOUCH ULTRA TEST) test strip CHECK GLUCOSE TWICE DAILY AND AS NEEDED, DX. E11.65 (UNCONTROLLED DM) 100 each 5  . losartan (COZAAR) 50 MG tablet Take 1 tablet (50 mg total) by mouth daily. 30 tablet 5  . metFORMIN (GLUCOPHAGE) 500 MG tablet Take 1 tablet (500 mg total) by mouth 2 (two) times daily with a meal. 60 tablet 11  . Multiple Vitamin (MULTIVITAMIN) tablet Take 1 tablet by mouth daily.      [name] Bicarbonate (ZEGERID OTC) 20-1100 MG CAPS Take 1 capsule by mouth daily.     . potassium chloride SA (KLOR-CON M20) 20 MEQ tablet Take 2 tablets (40 mEq total) by mouth  daily. 60 tablet 11  . rivaroxaban (XARELTO) 20 MG TABS tablet Take 1 tablet (20 mg total) by mouth daily with supper. 30 tablet 2  . spironolactone (ALDACTONE) 25 MG tablet Take 1 tablet (25 mg total) by mouth daily. 30 tablet 8   No current facility-administered medications on file prior to visit.    Review of Systems  Constitutional: Positive for appetite change and fatigue. Negative for fever.  HENT: Positive for congestion, postnasal drip, rhinorrhea, sinus pressure, sneezing and sore throat. Negative for ear pain.   Eyes: Negative for pain and discharge.  Respiratory: Positive for cough. Negative for shortness of breath, wheezing and stridor.   Cardiovascular: Negative for chest pain.  Gastrointestinal: Negative for nausea, vomiting and diarrhea.  Genitourinary: Negative for urgency, frequency and hematuria.  Musculoskeletal: Negative for myalgias and arthralgias.  Skin: Negative for rash.  Neurological: Positive for headaches. Negative for dizziness, weakness and light-headedness.  Psychiatric/Behavioral: Negative for confusion and dysphoric mood.       Objective:   Physical Exam  Constitutional: He appears well-developed and well-nourished. No distress.  obese and well appearing   HENT:  Head: Normocephalic and atraumatic.  Right Ear: External ear normal.  Left Ear: External ear normal.  Mouth/Throat: Oropharynx is clear and moist.  Nares are injected and congested  No sinus tenderness Clear rhinorrhea and post nasal drip   Eyes: Conjunctivae and EOM are normal. Pupils are equal, round, and reactive to light. Right eye exhibits no discharge. Left eye exhibits no discharge.  Neck: Normal range of motion. Neck supple.  Cardiovascular: Normal rate and normal heart sounds.   Pulmonary/Chest: Effort normal and breath sounds normal. No respiratory distress. He has no wheezes. He has no rales. He exhibits no tenderness.  Good air exch  Lymphadenopathy:    He has no cervical  adenopathy.  Neurological: He is alert.  Skin: Skin is warm and dry. No rash noted.  Psychiatric: He has a normal mood and affect.          Assessment & Plan:   Problem List Items Addressed This Visit      Respiratory   Viral URI with cough - Primary    Reassuring exam Keeps getting sick from grand children Disc symptomatic care - see instructions on AVS  Update if not starting to improve in a week or if worsening  - will watch for s/s of sinus infx which he gets frequently

## 2015-06-30 NOTE — Patient Instructions (Addendum)
I think you have a head cold (it may or may not move into your chest) Drink lots of water  Get flonase or nasacort over the counter at start using it every day as directed  You can use afrin nasal spray for severe congestion - for 2 days maximum (it is habit forming) for severe symptoms  Get some rest  Plain mucinex is ok to loosen/thin mucous in head and chest so you can get it out  Tessalon is ok for cough as well  Update if not starting to improve in a week or if worsening

## 2015-07-02 ENCOUNTER — Encounter: Payer: Self-pay | Admitting: Cardiology

## 2015-07-04 ENCOUNTER — Other Ambulatory Visit: Payer: BLUE CROSS/BLUE SHIELD

## 2015-07-09 LAB — CUP PACEART REMOTE DEVICE CHECK
Battery Remaining Percentage: 59 %
Brady Statistic AP VP Percent: 1 %
Brady Statistic AP VS Percent: 1 %
Brady Statistic AS VP Percent: 99 %
Brady Statistic AS VS Percent: 1 %
Brady Statistic RA Percent Paced: 1 %
HIGH POWER IMPEDANCE MEASURED VALUE: 88 Ohm
HighPow Impedance: 88 Ohm
Implantable Lead Implant Date: 20150121
Implantable Lead Location: 753859
Lead Channel Impedance Value: 450 Ohm
Lead Channel Pacing Threshold Amplitude: 0.75 V
Lead Channel Pacing Threshold Amplitude: 0.875 V
Lead Channel Pacing Threshold Amplitude: 2.25 V
Lead Channel Pacing Threshold Pulse Width: 0.5 ms
Lead Channel Pacing Threshold Pulse Width: 0.5 ms
Lead Channel Sensing Intrinsic Amplitude: 5 mV
Lead Channel Setting Pacing Amplitude: 1.75 V
Lead Channel Setting Pacing Amplitude: 2 V
Lead Channel Setting Pacing Pulse Width: 0.5 ms
MDC IDC LEAD IMPLANT DT: 20150121
MDC IDC LEAD IMPLANT DT: 20150121
MDC IDC LEAD LOCATION: 753858
MDC IDC LEAD LOCATION: 753860
MDC IDC MSMT BATTERY REMAINING LONGEVITY: 45 mo
MDC IDC MSMT BATTERY VOLTAGE: 2.96 V
MDC IDC MSMT LEADCHNL LV IMPEDANCE VALUE: 910 Ohm
MDC IDC MSMT LEADCHNL RA IMPEDANCE VALUE: 480 Ohm
MDC IDC MSMT LEADCHNL RA PACING THRESHOLD PULSEWIDTH: 0.5 ms
MDC IDC MSMT LEADCHNL RV SENSING INTR AMPL: 11.8 mV
MDC IDC PG SERIAL: 7133975
MDC IDC SESS DTM: 20170511202635
MDC IDC SET LEADCHNL LV PACING AMPLITUDE: 3.25 V
MDC IDC SET LEADCHNL RV PACING PULSEWIDTH: 0.5 ms
MDC IDC SET LEADCHNL RV SENSING SENSITIVITY: 0.5 mV

## 2015-07-15 ENCOUNTER — Ambulatory Visit: Payer: BLUE CROSS/BLUE SHIELD | Admitting: Family Medicine

## 2015-07-18 ENCOUNTER — Encounter: Payer: Self-pay | Admitting: Cardiology

## 2015-07-23 ENCOUNTER — Encounter: Payer: Self-pay | Admitting: Internal Medicine

## 2015-07-23 ENCOUNTER — Ambulatory Visit (INDEPENDENT_AMBULATORY_CARE_PROVIDER_SITE_OTHER): Payer: BLUE CROSS/BLUE SHIELD | Admitting: Internal Medicine

## 2015-07-23 VITALS — BP 124/86 | HR 76 | Temp 97.9°F | Wt 296.0 lb

## 2015-07-23 DIAGNOSIS — Z23 Encounter for immunization: Secondary | ICD-10-CM | POA: Diagnosis not present

## 2015-07-23 DIAGNOSIS — E119 Type 2 diabetes mellitus without complications: Secondary | ICD-10-CM

## 2015-07-23 DIAGNOSIS — S40811A Abrasion of right upper arm, initial encounter: Secondary | ICD-10-CM

## 2015-07-23 DIAGNOSIS — W133XXA Fall through floor, initial encounter: Secondary | ICD-10-CM | POA: Diagnosis not present

## 2015-07-23 DIAGNOSIS — S80811A Abrasion, right lower leg, initial encounter: Secondary | ICD-10-CM

## 2015-07-23 NOTE — Addendum Note (Signed)
Addended by: Lurlean Nanny on: 07/23/2015 04:59 PM   Modules accepted: Orders

## 2015-07-23 NOTE — Progress Notes (Signed)
Pre visit review using our clinic review tool, if applicable. No additional management support is needed unless otherwise documented below in the visit note. 

## 2015-07-23 NOTE — Patient Instructions (Signed)
Abrasion An abrasion is a cut or scrape on the outer surface of your skin. An abrasion does not extend through all of the layers of your skin. It is important to care for your abrasion properly to prevent infection. CAUSES Most abrasions are caused by falling on or gliding across the ground or another surface. When your skin rubs on something, the outer and inner layer of skin rubs off.  SYMPTOMS A cut or scrape is the main symptom of this condition. The scrape may be bleeding, or it may appear red or pink. If there was an associated fall, there may be an underlying bruise. DIAGNOSIS An abrasion is diagnosed with a physical exam. TREATMENT Treatment for this condition depends on how large and deep the abrasion is. Usually, your abrasion will be cleaned with water and mild soap. This removes any dirt or debris that may be stuck. An antibiotic ointment may be applied to the abrasion to help prevent infection. A bandage (dressing) may be placed on the abrasion to keep it clean. You may also need a tetanus shot. HOME CARE INSTRUCTIONS Medicines  Take or apply medicines only as directed by your health care provider.  If you were prescribed an antibiotic ointment, finish all of it even if you start to feel better. Wound Care  Clean the wound with mild soap and water 2-3 times per day or as directed by your health care provider. Pat your wound dry with a clean towel. Do not rub it.  There are many different ways to close and cover a wound. Follow instructions from your health care provider about:  Wound care.  Dressing changes and removal.  Check your wound every day for signs of infection. Watch for:  Redness, swelling, or pain.  Fluid, blood, or pus. General Instructions  Keep the dressing dry as directed by your health care provider. Do not take baths, swim, use a hot tub, or do anything that would put your wound underwater until your health care provider approves.  If there is  swelling, raise (elevate) the injured area above the level of your heart while you are sitting or lying down.  Keep all follow-up visits as directed by your health care provider. This is important. SEEK MEDICAL CARE IF:  You received a tetanus shot and you have swelling, severe pain, redness, or bleeding at the injection site.  Your pain is not controlled with medicine.  You have increased redness, swelling, or pain at the site of your wound. SEEK IMMEDIATE MEDICAL CARE IF:  You have a red streak going away from your wound.  You have a fever.  You have fluid, blood, or pus coming from your wound.  You notice a bad smell coming from your wound or your dressing.   This information is not intended to replace advice given to you by your health care provider. Make sure you discuss any questions you have with your health care provider.   Document Released: 10/21/2004 Document Revised: 10/02/2014 Document Reviewed: 01/09/2014 Elsevier Interactive Patient Education 2016 [employer] Inc[employer]  

## 2015-07-23 NOTE — Progress Notes (Signed)
Subjective:    Patient ID: Aaron Thompson, male    DOB: [birth year], 55 y.o.   MRN: [id number]  HPI  Pt presents to the clinic today requesting a tetanus injection. He reports he has a cut on his leg, from falling through the floor while doing some work on his home. He has some abrasions to his right arm and right leg. He denies redness, warmth or drainage from the abrasions. He has not tried anything OTC.  He can not remember the last time he had a tetanus injection. He does have a history of DM 2, last A1C reviewed.  Review of Systems      Past Medical History  Diagnosis Date  . Hypertension   . DJD (degenerative joint disease)   . Dermatophytosis of the body   . Esophageal reflux   . Other chronic nonalcoholic liver disease     fatty liver  . Unspecified hearing loss   . Anxiety state, unspecified   . Non-ischemic cardiomyopathy ([place])     a.  cath 6/11: OM2 30%; EF 30%;  b. echo 2/12: EF 30%, mild MR, mild LAE;  c.  Echo (11/14):  Mod LVH, EF 25-30%, diff HK, mod LAE.   [name] PAF (paroxysmal atrial fibrillation) ([place])     a. s/p DCCV 6/11; previously on Pradaxa;  b. Event Monitor 2012->No PAF;  c. 09/2011 s/p DCCV ->Xarelto started. d. 03/2014: inappropriate ICD shocks for AF-RVR, started on Tikosyn, Xarelto restarted.  [name] LBBB (left bundle branch block)     intermittent  . Hx of colonic polyps   . Osteoarthrosis, unspecified whether generalized or localized, hand   . Chronic systolic CHF (congestive heart failure) ([place])   . OSA on CPAP   . Type II diabetes mellitus ([place])   . Calculus of kidney 1981    "passed it on my own"    Current Outpatient Prescriptions  Medication Sig Dispense Refill  . albuterol (PROVENTIL HFA;VENTOLIN HFA) 108 (90 Base) MCG/ACT inhaler Inhale 2 puffs into the lungs every 4 (four) hours as needed for wheezing or shortness of breath. 1 Inhaler 1  . benzonatate (TESSALON) 200 MG capsule Take 1 capsule (200 mg total) by mouth 3 (three) times daily as needed  for cough (swallow whole do not bite pill). 30 capsule 1  . carvedilol (COREG) 25 MG tablet TAKE 1 AND 1/2 TABLETS (37.5 MG TOTAL) BY MOUTH 2 (TWO) TIMES DAILY WITH A MEAL. 270 tablet 1  . dofetilide (TIKOSYN) 500 MCG capsule Take 1 capsule (500 mcg total) by mouth 2 (two) times daily. 60 capsule 6  . glipiZIDE (GLUCOTROL XL) 5 MG 24 hr tablet Take 1 tablet (5 mg total) by mouth daily with breakfast. 30 tablet 11  . glucose blood (ONE TOUCH ULTRA TEST) test strip CHECK GLUCOSE TWICE DAILY AND AS NEEDED, DX. E11.65 (UNCONTROLLED DM) 100 each 5  . losartan (COZAAR) 50 MG tablet Take 1 tablet (50 mg total) by mouth daily. 30 tablet 5  . metFORMIN (GLUCOPHAGE) 500 MG tablet Take 1 tablet (500 mg total) by mouth 2 (two) times daily with a meal. 60 tablet 11  . Multiple Vitamin (MULTIVITAMIN) tablet Take 1 tablet by mouth daily.      [name] Bicarbonate (ZEGERID OTC) 20-1100 MG CAPS Take 1 capsule by mouth daily.     . potassium chloride SA (KLOR-CON M20) 20 MEQ tablet Take 2 tablets (40 mEq total) by mouth daily. 60 tablet 11  . rivaroxaban (XARELTO) 20 MG  TABS tablet Take 1 tablet (20 mg total) by mouth daily with supper. 30 tablet 2  . spironolactone (ALDACTONE) 25 MG tablet Take 1 tablet (25 mg total) by mouth daily. 30 tablet 8   No current facility-administered medications for this visit.    No Known Allergies  Family History  Problem Relation Age of Onset  . Hypertension Mother   . Diabetes Mother   . Hypertension Father   . Diabetes Father   . Diabetes Brother   . Kidney disease Brother   . Diabetes Brother   . Heart failure Mother     Died @ 15  . Heart failure Father     Died @ 33  . Other Sister     4 sisters A&W  . Heart attack Paternal Grandfather   . Stroke Paternal Grandmother     Social History   Social History  . Marital Status: Married    Spouse Name: [name]  . Number of Children: 2  . Years of Education: [name]   Occupational History  . PAINTER  Unemployed   Social History Main Topics  . Smoking status: Never Smoker   . Smokeless tobacco: Former Systems developer    Types: Snuff     Comment: 02/14/2013 "quit snuff in 2006"  . Alcohol Use: No  . Drug Use: No  . Sexual Activity: Yes   Other Topics Concern  . Not on file   Social History Narrative   Lives in [place] with wife.  Works @ [employer] in [employer].  Not routinely exercising.     Constitutional: Denies fever, malaise, fatigue, headache or abrupt weight changes.  Skin: Pt reports abrasion to right arm and right leg. Denies redness, rashes, lesions or ulcercations.   No other specific complaints in a complete review of systems (except as listed in HPI above).  Objective:   Physical Exam   BP 124/86 mmHg  Pulse 76  Temp(Src) 97.9 F (36.6 C) (Oral)  Wt 296 lb (134.265 kg)  SpO2 97% Wt Readings from Last 3 Encounters:  07/23/15 296 lb (134.265 kg)  06/30/15 295 lb 8 oz (134.038 kg)  06/12/15 298 lb 4 oz (135.285 kg)    General: Appears his stated age, obese in NAD. Skin: Scattered abrasions noted on right arm and right leg. No surrounding redness, warmth or drainage.  BMET    Component Value Date/Time   NA 138 04/07/2015 0834   K 4.3 04/07/2015 0834   CL 101 04/07/2015 0834   CO2 30 04/07/2015 0834   GLUCOSE 190* 04/07/2015 0834   BUN 13 04/07/2015 0834   CREATININE 0.73 04/07/2015 0834   CREATININE 0.83 03/27/2013 1730   CALCIUM 9.1 04/07/2015 0834   GFRNONAA >60 03/17/2015 0625   GFRAA >60 03/17/2015 0625    Lipid Panel     Component Value Date/Time   CHOL 149 04/07/2015 0834   TRIG 129.0 04/07/2015 0834   HDL 29.70* 04/07/2015 0834   CHOLHDL 5 04/07/2015 0834   VLDL 25.8 04/07/2015 0834   LDLCALC 94 04/07/2015 0834    CBC    Component Value Date/Time   WBC 10.8* 03/17/2015 0625   RBC 5.23 03/17/2015 0625   HGB 15.4 03/17/2015 0625   HCT 44.7 03/17/2015 0625   PLT 180 03/17/2015 0625   MCV 85.5 03/17/2015 0625   MCH 29.4  03/17/2015 0625   MCHC 34.5 03/17/2015 0625   RDW 13.7 03/17/2015 0625   LYMPHSABS 2.5 03/17/2015 0010   MONOABS 1.1* 03/17/2015 0010  EOSABS 0.3 03/17/2015 0010   BASOSABS 0.0 03/17/2015 0010    Hgb A1C Lab Results  Component Value Date   HGBA1C 7.4* 04/07/2015        Assessment & Plan:   Abrasion to right leg, right arm s/p fall through floor, history of DM 2:  Wash areas with warm water and soap Can put Neosporin on abrasions Td booster today No indication for abx at this time Return precautions given  RTC as needed or if symptoms persist or worsen

## 2015-07-29 ENCOUNTER — Emergency Department (HOSPITAL_COMMUNITY)
Admission: EM | Admit: 2015-07-29 | Discharge: 2015-07-29 | Disposition: A | Discharge status: Home / Self Care | Payer: BLUE CROSS/BLUE SHIELD | Attending: Emergency Medicine | Admitting: Emergency Medicine

## 2015-07-29 ENCOUNTER — Emergency Department (HOSPITAL_COMMUNITY): Payer: BLUE CROSS/BLUE SHIELD

## 2015-07-29 ENCOUNTER — Encounter (HOSPITAL_COMMUNITY): Payer: Self-pay

## 2015-07-29 DIAGNOSIS — I480 Paroxysmal atrial fibrillation: Secondary | ICD-10-CM | POA: Insufficient documentation

## 2015-07-29 DIAGNOSIS — Z7984 Long term (current) use of oral hypoglycemic drugs: Secondary | ICD-10-CM | POA: Diagnosis not present

## 2015-07-29 DIAGNOSIS — I5022 Chronic systolic (congestive) heart failure: Secondary | ICD-10-CM | POA: Diagnosis not present

## 2015-07-29 DIAGNOSIS — E119 Type 2 diabetes mellitus without complications: Secondary | ICD-10-CM | POA: Diagnosis not present

## 2015-07-29 DIAGNOSIS — I4891 Unspecified atrial fibrillation: Secondary | ICD-10-CM | POA: Diagnosis not present

## 2015-07-29 DIAGNOSIS — Z79899 Other long term (current) drug therapy: Secondary | ICD-10-CM | POA: Insufficient documentation

## 2015-07-29 DIAGNOSIS — I110 Hypertensive heart disease with heart failure: Secondary | ICD-10-CM | POA: Insufficient documentation

## 2015-07-29 DIAGNOSIS — R0602 Shortness of breath: Secondary | ICD-10-CM | POA: Diagnosis present

## 2015-07-29 LAB — CBC WITH DIFFERENTIAL/PLATELET
BASOS ABS: 0.1 10*3/uL (ref 0.0–0.1)
BASOS PCT: 1 %
EOS ABS: 0.3 10*3/uL (ref 0.0–0.7)
Eosinophils Relative: 3 %
HCT: 47.3 % (ref 39.0–52.0)
HEMOGLOBIN: 15.5 g/dL (ref 13.0–17.0)
LYMPHS ABS: 3.1 10*3/uL (ref 0.7–4.0)
LYMPHS PCT: 32 %
MCH: 28.6 pg (ref 26.0–34.0)
MCHC: 32.8 g/dL (ref 30.0–36.0)
MCV: 87.3 fL (ref 78.0–100.0)
Monocytes Absolute: 0.7 10*3/uL (ref 0.1–1.0)
Monocytes Relative: 7 %
NEUTROS ABS: 5.5 10*3/uL (ref 1.7–7.7)
Neutrophils Relative %: 57 %
PLATELETS: 233 10*3/uL (ref 150–400)
RBC: 5.42 MIL/uL (ref 4.22–5.81)
RDW: 13.6 % (ref 11.5–15.5)
WBC: 9.7 10*3/uL (ref 4.0–10.5)

## 2015-07-29 LAB — T4, FREE: FREE T4: 0.9 ng/dL (ref 0.61–1.12)

## 2015-07-29 LAB — BASIC METABOLIC PANEL
Anion gap: 7 (ref 5–15)
BUN: 20 mg/dL (ref 6–20)
CHLORIDE: 106 mmol/L (ref 101–111)
CO2: 24 mmol/L (ref 22–32)
CREATININE: 0.78 mg/dL (ref 0.61–1.24)
Calcium: 8.7 mg/dL — ABNORMAL LOW (ref 8.9–10.3)
GFR calc Af Amer: >60 mL/min (ref >60)
GFR calc non Af Amer: >60 mL/min (ref >60)
GLUCOSE: 212 mg/dL — AB (ref 65–99)
POTASSIUM: 3.9 mmol/L (ref 3.5–5.1)
SODIUM: 137 mmol/L (ref 135–145)

## 2015-07-29 LAB — TSH: TSH: 2.041 u[IU]/mL (ref 0.350–4.500)

## 2015-07-29 LAB — MAGNESIUM: Magnesium: 2 mg/dL (ref 1.7–2.4)

## 2015-07-29 LAB — BRAIN NATRIURETIC PEPTIDE: B Natriuretic Peptide: 24.9 pg/mL (ref 0.0–100.0)

## 2015-07-29 MED ORDER — FENTANYL CITRATE (PF) 100 MCG/2ML IJ SOLN
INTRAMUSCULAR | Status: AC
  Filled 2015-07-29: qty 2

## 2015-07-29 MED ORDER — SODIUM CHLORIDE 0.9 % IV BOLUS (SEPSIS)
1000.0000 mL | Freq: Once | INTRAVENOUS | Status: AC
  Administered 2015-07-29: 1000 mL via INTRAVENOUS

## 2015-07-29 MED ORDER — FENTANYL CITRATE (PF) 100 MCG/2ML IJ SOLN
100.0000 ug | Freq: Once | INTRAMUSCULAR | Status: AC
  Administered 2015-07-29: 100 ug via INTRAVENOUS

## 2015-07-29 MED ORDER — MIDAZOLAM HCL 2 MG/2ML IJ SOLN
INTRAMUSCULAR | Status: AC
  Filled 2015-07-29: qty 6

## 2015-07-29 MED ORDER — PROCAINAMIDE HCL 100 MG/ML IJ SOLN: 1000.0000 mg | Freq: Once | INTRAVENOUS | Status: DC

## 2015-07-29 MED ORDER — MIDAZOLAM HCL 2 MG/2ML IJ SOLN
2.0000 mg | Freq: Once | INTRAMUSCULAR | Status: AC
  Administered 2015-07-29: 2 mg via INTRAVENOUS

## 2015-07-29 MED ORDER — MIDAZOLAM HCL 2 MG/2ML IJ SOLN
INTRAMUSCULAR | Status: AC
  Filled 2015-07-29: qty 2

## 2015-07-29 MED ORDER — HYDROMORPHONE HCL 1 MG/ML IJ SOLN: 1.0000 mg | Freq: Once | INTRAMUSCULAR | Status: DC

## 2015-07-29 NOTE — ED Notes (Signed)
Pt woke up from his sleep and felt his heart beating fast, pt has defibrillator and placed his magnet on. Pt in [place]. C/o CP

## 2015-07-29 NOTE — Progress Notes (Signed)
Pt cardioverted early am and now observed post DCCV on dofetilide without arrhythmia K 3.9 In Sinus  Will increase K rich foods and recheck bmet in about two weeks Have encouraged him to follow through on abnormal sleep study

## 2015-07-29 NOTE — Consult Note (Signed)
Chief Complaint/Reason for Consult: Afib with RVR   Requesting Physician: Claudine Mouton   PCP:  [name], MD Primary Cardiologist: Caryl Comes  HPI: Mr. Manley is a 55 yo man with PMH of non-ischemic cardiomyopathy, mild conronary artery disease, T2DM, OSA on CPAP, LBBB, atrial fibrillation on tikosyn with previous inappropriate therapies for atrial fibrillation who presents with palpitations to the ED. He tells me he's not had any recent chest pain, shortness of breath, edema, infections, sick contacts.  He had acute palpitations/shorntess of breath that awoke him from sleep this evening. He relays compliance with his tikosyn and his xarelto. He was found to be in atrial fibrillation with RVR in the ER. Otherwise BP stable.  Labs not returned yet.  See previous notes, he's had several similar admissions, all requiring DCCV and subsequent ED d/c.   Past Medical History  Diagnosis Date  . Hypertension   . DJD (degenerative joint disease)   . Dermatophytosis of the body   . Esophageal reflux   . Other chronic nonalcoholic liver disease     fatty liver  . Unspecified hearing loss   . Anxiety state, unspecified   . Non-ischemic cardiomyopathy ([place])     a.  cath 6/11: OM2 30%; EF 30%;  b. echo 2/12: EF 30%, mild MR, mild LAE;  c.  Echo (11/14):  Mod LVH, EF 25-30%, diff HK, mod LAE.   [name] PAF (paroxysmal atrial fibrillation) ([place])     a. s/p DCCV 6/11; previously on Pradaxa;  b. Event Monitor 2012->No PAF;  c. 09/2011 s/p DCCV ->Xarelto started. d. 03/2014: inappropriate ICD shocks for AF-RVR, started on Tikosyn, Xarelto restarted.  [name] LBBB (left bundle branch block)     intermittent  . Hx of colonic polyps   . Osteoarthrosis, unspecified whether generalized or localized, hand   . Chronic systolic CHF (congestive heart failure) ([place])   . OSA on CPAP   . Type II diabetes mellitus ([place])   . Calculus of kidney 1981    "passed it on my own"    Past Surgical History  Procedure Laterality Date  . Neck  surgery      plate/fusion  . Bilateral knee arthroscopy    . Stress cardiolite  08/2005    Normal, EF 42%  . Upper gastrointestinal endoscopy  08/2006    GERD  . Abdominal US  01/2007    Fatty liver, no gallstones  . Doppler echocardiography  11/2009    Decreased EF of 35-40%  . Appendectomy    . Cardioversion  09/29/2011    Procedure: CARDIOVERSION;  Surgeon: Thayer Headings, MD;  Location: [facility];  Service: Cardiovascular;  Laterality: [name];  . Left heart catheterization with coronary angiogram [name] 08/18/2011    Procedure: LEFT HEART CATHETERIZATION WITH CORONARY ANGIOGRAM;  Surgeon: Peter M Martinique, MD;  Location: [facility] CATH LAB;  Service: Cardiovascular;  Laterality: [name];  . Bi-ventricular implantable cardioverter defibrillator [name] 02/14/2013    STJ CRTD implanted by Dr Caryl Comes    Family History  Problem Relation Age of Onset  . Hypertension Mother   . Diabetes Mother   . Hypertension Father   . Diabetes Father   . Diabetes Brother   . Kidney disease Brother   . Diabetes Brother   . Heart failure Mother     Died @ 64  . Heart failure Father     Died @ 3  . Other Sister     4 sisters A&W  . Heart attack Paternal Grandfather   .  Stroke Paternal Grandmother    Social History:  reports that he has never smoked. He has quit using smokeless tobacco. His smokeless tobacco use included Snuff. He reports that he does not drink alcohol or use illicit drugs.  Allergies: No Known Allergies  No current facility-administered medications on file prior to encounter.   Current Outpatient Prescriptions on File Prior to Encounter  Medication Sig Dispense Refill  . albuterol (PROVENTIL HFA;VENTOLIN HFA) 108 (90 Base) MCG/ACT inhaler Inhale 2 puffs into the lungs every 4 (four) hours as needed for wheezing or shortness of breath. 1 Inhaler 1  . carvedilol (COREG) 25 MG tablet TAKE 1 AND 1/2 TABLETS (37.5 MG TOTAL) BY MOUTH 2 (TWO) TIMES DAILY WITH A MEAL. 270 tablet 1  . dofetilide (TIKOSYN) 500  MCG capsule Take 1 capsule (500 mcg total) by mouth 2 (two) times daily. 60 capsule 6  . glipiZIDE (GLUCOTROL XL) 5 MG 24 hr tablet Take 1 tablet (5 mg total) by mouth daily with breakfast. 30 tablet 11  . losartan (COZAAR) 50 MG tablet Take 1 tablet (50 mg total) by mouth daily. 30 tablet 5  . metFORMIN (GLUCOPHAGE) 500 MG tablet Take 1 tablet (500 mg total) by mouth 2 (two) times daily with a meal. 60 tablet 11  . Multiple Vitamin (MULTIVITAMIN) tablet Take 1 tablet by mouth daily.      [name] Bicarbonate (ZEGERID OTC) 20-1100 MG CAPS Take 1 capsule by mouth daily.     . potassium chloride SA (KLOR-CON M20) 20 MEQ tablet Take 2 tablets (40 mEq total) by mouth daily. 60 tablet 11  . rivaroxaban (XARELTO) 20 MG TABS tablet Take 1 tablet (20 mg total) by mouth daily with supper. 30 tablet 2  . spironolactone (ALDACTONE) 25 MG tablet Take 1 tablet (25 mg total) by mouth daily. 30 tablet 8  . benzonatate (TESSALON) 200 MG capsule Take 1 capsule (200 mg total) by mouth 3 (three) times daily as needed for cough (swallow whole do not bite pill). (Patient not taking: Reported on 07/29/2015) 30 capsule 1  . glucose blood (ONE TOUCH ULTRA TEST) test strip CHECK GLUCOSE TWICE DAILY AND AS NEEDED, DX. E11.65 (UNCONTROLLED DM) 100 each 5    Results for orders placed or performed during the hospital encounter of 07/29/15 (from the past 48 hour(s))  CBC with Differential/Platelet     Status: None (Preliminary result)   Collection Time: 07/29/15  5:34 AM  Result Value Ref Range   WBC 9.7 4.0 - 10.5 K/uL   RBC 5.42 4.22 - 5.81 MIL/uL   Hemoglobin 15.5 13.0 - 17.0 g/dL   HCT 47.3 39.0 - 52.0 %   MCV 87.3 78.0 - 100.0 fL   MCH 28.6 26.0 - 34.0 pg   MCHC 32.8 30.0 - 36.0 g/dL   RDW 13.6 11.5 - 15.5 %   Platelets 233 150 - 400 K/uL   Neutrophils Relative % PENDING %   Neutro Abs PENDING 1.7 - 7.7 K/uL   Band Neutrophils PENDING %   Lymphocytes Relative PENDING %   Lymphs Abs PENDING 0.7 - 4.0  K/uL   Monocytes Relative PENDING %   Monocytes Absolute PENDING 0.1 - 1.0 K/uL   Eosinophils Relative PENDING %   Eosinophils Absolute PENDING 0.0 - 0.7 K/uL   Basophils Relative PENDING %   Basophils Absolute PENDING 0.0 - 0.1 K/uL   WBC Morphology PENDING    RBC Morphology PENDING    Smear Review PENDING    nRBC PENDING  0 /100 WBC   Metamyelocytes Relative PENDING %   Myelocytes PENDING %   Promyelocytes Absolute PENDING %   Blasts PENDING %   No results found.  ROS: Otherwise, review of systems is negative unless per above HPI  Filed Vitals:   07/29/15 0540 07/29/15 0543 07/29/15 0546 07/29/15 0549  BP:  153/106  134/99  Pulse: 98 53 99   Temp:      Resp: 22 13 15    Height:      Weight:      SpO2: 100% 98% 98% 98%   Wt Readings from Last 10 Encounters:  07/29/15 134.265 kg (296 lb)  07/23/15 134.265 kg (296 lb)  06/30/15 134.038 kg (295 lb 8 oz)  06/12/15 135.285 kg (298 lb 4 oz)  04/08/15 133.584 kg (294 lb 8 oz)  03/26/15 133.766 kg (294 lb 14.4 oz)  03/13/15 137.893 kg (304 lb)  12/03/14 130.863 kg (288 lb 8 oz)  09/02/14 126.826 kg (279 lb 9.6 oz)  07/31/14 129.275 kg (285 lb)    PE:  General: No acute distress, obese HEENT: Atraumatic, EOMI, mucous membranes moist CV: irr irr, tachycardia, no murmurs, gallops. No JVD at 45 degrees. No HJR. Respiratory: Clear, no crackles. Normal work of breathing ABD: Non-distended and non-tender. No palpable organomegaly.  Extremities: 2+ radial pulses bilaterally. No edema. Neuro/Psych: CN grossly intact, alert and oriented  Assessment/Plan Mr. Friesz is a 55 yo man with PMH of non-ischemic cardiomyopathy, mild conronary artery disease, T2DM, OSA on CPAP, LBBB, atrial fibrillation on tikosyn with previous inappropriate therapies for atrial fibrillation who presents with palpitations found to have recurrent afib with RVR (rates ~190). CHADS2VASC = 3, no missed any doses of Xarelto.  Now s/p DCCV in ED.  Afib: Now bi-V  paced s/p DCCV in ED. Continue underlying rate control strategy with coreg 37.5 mg BID, Tikosyn 500 mg BID, and Xarelto.  Likely will need to observe post DCCV for a few hours and review interrogation report of SJ device prior to DC home from ED [  ] Review labs looking for triggers of AF [  ] Review interrogation report ---- F/U with Dr. Caryl Comes in EP  Nonischemic heart failure, EF ~20% s/p Bi-V-ICD: No obvious exacerbation, weights stable. Continue home spiro, losartan, coreg.   OSA: Wears CPAP, needs to continue compliance to help reduce AF burden  HTN: controlled, continue medications as above  Lolita Cram Zykerria Tanton  MD 07/29/2015, 6:13 AM

## 2015-07-29 NOTE — ED Provider Notes (Signed)
CSN: [id number]     Arrival date & time 07/29/15  0506 History   First MD Initiated Contact with Patient 07/29/15 [phone]     Chief Complaint  Patient presents with  . Tachycardia     (Consider location/radiation/quality/duration/timing/severity/associated sxs/prior Treatment) HPI   KILE JUETT is a 55 y.o. male with past medical history of hypertension, nonischemic cardiomyopathy status post pacemaker, atrial fibrillation, diabetes, presenting today with shortness of breath and palpitations. Patient states he was sleeping when he felt his heart beat fast. He place a magnet on his chest to prevent any shock as directed by his cardiologist. He also has mild chest pain. He states this has happened to him 3 times in the past couple of years. He denies any recent illness, fevers, or cough. He has been compliant with all medications. There are no further complaints.  10 Systems reviewed and are negative for acute change except as noted in the HPI.     Past Medical History  Diagnosis Date  . Hypertension   . DJD (degenerative joint disease)   . Dermatophytosis of the body   . Esophageal reflux   . Other chronic nonalcoholic liver disease     fatty liver  . Unspecified hearing loss   . Anxiety state, unspecified   . Non-ischemic cardiomyopathy ([place])     a.  cath 6/11: OM2 30%; EF 30%;  b. echo 2/12: EF 30%, mild MR, mild LAE;  c.  Echo (11/14):  Mod LVH, EF 25-30%, diff HK, mod LAE.   [name] PAF (paroxysmal atrial fibrillation) ([place])     a. s/p DCCV 6/11; previously on Pradaxa;  b. Event Monitor 2012->No PAF;  c. 09/2011 s/p DCCV ->Xarelto started. d. 03/2014: inappropriate ICD shocks for AF-RVR, started on Tikosyn, Xarelto restarted.  [name] LBBB (left bundle branch block)     intermittent  . Hx of colonic polyps   . Osteoarthrosis, unspecified whether generalized or localized, hand   . Chronic systolic CHF (congestive heart failure) ([place])   . OSA on CPAP   . Type II diabetes mellitus ([place])   .  Calculus of kidney 1981    "passed it on my own"   Past Surgical History  Procedure Laterality Date  . Neck surgery      plate/fusion  . Bilateral knee arthroscopy    . Stress cardiolite  08/2005    Normal, EF 42%  . Upper gastrointestinal endoscopy  08/2006    GERD  . Abdominal US  01/2007    Fatty liver, no gallstones  . Doppler echocardiography  11/2009    Decreased EF of 35-40%  . Appendectomy    . Cardioversion  09/29/2011    Procedure: CARDIOVERSION;  Surgeon: Thayer Headings, MD;  Location: [facility];  Service: Cardiovascular;  Laterality: [name];  . Left heart catheterization with coronary angiogram [name] 08/18/2011    Procedure: LEFT HEART CATHETERIZATION WITH CORONARY ANGIOGRAM;  Surgeon: Peter M Martinique, MD;  Location: [facility] CATH LAB;  Service: Cardiovascular;  Laterality: [name];  . Bi-ventricular implantable cardioverter defibrillator [name] 02/14/2013    STJ CRTD implanted by Dr Caryl Comes   Family History  Problem Relation Age of Onset  . Hypertension Mother   . Diabetes Mother   . Hypertension Father   . Diabetes Father   . Diabetes Brother   . Kidney disease Brother   . Diabetes Brother   . Heart failure Mother     Died @ 61  . Heart failure Father  Died @ 39  . Other Sister     4 sisters A&W  . Heart attack Paternal Grandfather   . Stroke Paternal Grandmother    Social History  Substance Use Topics  . Smoking status: Never Smoker   . Smokeless tobacco: Former Systems developer    Types: Snuff     Comment: 02/14/2013 "quit snuff in 2006"  . Alcohol Use: No    Review of Systems    Allergies  Review of patient's allergies indicates no known allergies.  Home Medications   Prior to Admission medications   Medication Sig Start Date End Date Taking? Authorizing Provider  albuterol (PROVENTIL HFA;VENTOLIN HFA) 108 (90 Base) MCG/ACT inhaler Inhale 2 puffs into the lungs every 4 (four) hours as needed for wheezing or shortness of breath. 04/08/15  Yes Abner Greenspan, MD  carvedilol  (COREG) 25 MG tablet TAKE 1 AND 1/2 TABLETS (37.5 MG TOTAL) BY MOUTH 2 (TWO) TIMES DAILY WITH A MEAL. 03/26/15  Yes Amber Sena Slate, NP  dofetilide (TIKOSYN) 500 MCG capsule Take 1 capsule (500 mcg total) by mouth 2 (two) times daily. 03/26/15  Yes Amber Sena Slate, NP  glipiZIDE (GLUCOTROL XL) 5 MG 24 hr tablet Take 1 tablet (5 mg total) by mouth daily with breakfast. 12/03/14  Yes Abner Greenspan, MD  losartan (COZAAR) 50 MG tablet Take 1 tablet (50 mg total) by mouth daily. 03/26/15  Yes Amber Sena Slate, NP  metFORMIN (GLUCOPHAGE) 500 MG tablet Take 1 tablet (500 mg total) by mouth 2 (two) times daily with a meal. 07/31/14  Yes Abner Greenspan, MD  Multiple Vitamin (MULTIVITAMIN) tablet Take 1 tablet by mouth daily.     Yes Historical Provider, MD  Omeprazole-Sodium Bicarbonate (ZEGERID OTC) 20-1100 MG CAPS Take 1 capsule by mouth daily.    Yes Historical Provider, MD  potassium chloride SA (KLOR-CON M20) 20 MEQ tablet Take 2 tablets (40 mEq total) by mouth daily. 03/26/15  Yes Amber Sena Slate, NP  rivaroxaban (XARELTO) 20 MG TABS tablet Take 1 tablet (20 mg total) by mouth daily with supper. 03/26/15  Yes Amber Sena Slate, NP  spironolactone (ALDACTONE) 25 MG tablet Take 1 tablet (25 mg total) by mouth daily. 03/26/15  Yes Amber Sena Slate, NP  benzonatate (TESSALON) 200 MG capsule Take 1 capsule (200 mg total) by mouth 3 (three) times daily as needed for cough (swallow whole do not bite pill). Patient not taking: Reported on 07/29/2015 06/30/15   Abner Greenspan, MD  glucose blood (ONE TOUCH ULTRA TEST) test strip CHECK GLUCOSE TWICE DAILY AND AS NEEDED, DX. E11.65 (UNCONTROLLED DM) 03/27/15   Wynelle Fanny Tower, MD   BP 134/99 mmHg  Pulse 99  Temp(Src) 98.2 F (36.8 C)  Resp 16  Ht 6\' 4"  (1.93 m)  Wt 296 lb (134.265 kg)  BMI 36.05 kg/m2  SpO2 96% Physical Exam  Constitutional: He is oriented to person, place, and time. Vital signs are normal. He appears well-developed and well-nourished.  Non-toxic appearance. He does not  appear ill. He appears distressed.  HENT:  Head: Normocephalic and atraumatic.  Nose: Nose normal.  Mouth/Throat: Oropharynx is clear and moist. No oropharyngeal exudate.  Eyes: Conjunctivae and EOM are normal. Pupils are equal, round, and reactive to light. No scleral icterus.  Neck: Normal range of motion. Neck supple. No tracheal deviation, no edema, no erythema and normal range of motion present. No thyroid mass and no thyromegaly present.  Cardiovascular: S1 normal, S2 normal, normal heart sounds,  intact distal pulses and normal pulses.  Exam reveals no gallop and no friction rub.   No murmur heard. Irregularly irregular rhythm with tachycardia  Pulmonary/Chest: Effort normal and breath sounds normal. No respiratory distress. He has no wheezes. He has no rhonchi. He has no rales.  Abdominal: Soft. Normal appearance and bowel sounds are normal. He exhibits no distension, no ascites and no mass. There is no hepatosplenomegaly. There is no tenderness. There is no rebound, no guarding and no CVA tenderness.  Musculoskeletal: Normal range of motion. He exhibits no edema or tenderness.  Lymphadenopathy:    He has no cervical adenopathy.  Neurological: He is alert and oriented to person, place, and time. He has normal strength. No cranial nerve deficit or sensory deficit.  Skin: Skin is warm, dry and intact. No petechiae and no rash noted. He is not diaphoretic. No erythema. No pallor.  Psychiatric: He has a normal mood and affect. His behavior is normal. Judgment normal.  Nursing note and vitals reviewed.   ED Course  .Cardioversion Date/Time: 07/29/2015 6:39 AM Performed by: Everlene Balls Authorized by: Everlene Balls Consent: The procedure was performed in an emergent situation. Verbal consent obtained. Written consent obtained. Risks and benefits: risks, benefits and alternatives were discussed Consent given by: patient Patient understanding: patient states understanding of the procedure  being performed Patient consent: the patient's understanding of the procedure matches consent given Procedure consent: procedure consent matches procedure scheduled Relevant documents: relevant documents present and verified Test results: test results available and properly labeled Site marked: the operative site was marked Imaging studies: imaging studies not available Required items: required blood products, implants, devices, and special equipment available Patient identity confirmed: verbally with patient and hospital-assigned identification number Time out: Immediately prior to procedure a "time out" was called to verify the correct patient, procedure, equipment, support staff and site/side marked as required. Patient sedated: yes Sedation type: anxiolysis Sedatives: midazolam Analgesia: fentanyl Vitals: Vital signs were monitored during sedation. Cardioversion basis: emergent Pre-procedure rhythm: atrial fibrillation Patient position: patient was placed in a supine position Chest area: chest area exposed Electrodes: pads Electrodes placed: anterior-posterior Number of attempts: 1 Attempt 1 mode: synchronous Attempt 1 waveform: biphasic Attempt 1 shock (in Joules): 200 Attempt 1 outcome: conversion to normal sinus rhythm Post-procedure rhythm: normal sinus rhythm Complications: no complications Patient tolerance: Patient tolerated the procedure well with no immediate complications   (including critical care time) Labs Review Labs Reviewed  BASIC METABOLIC PANEL - Abnormal; Notable for the following:    Glucose, Bld 212 (*)    Calcium 8.7 (*)    All other components within normal limits  CBC WITH DIFFERENTIAL/PLATELET  MAGNESIUM  TSH  BRAIN NATRIURETIC PEPTIDE  T4, FREE    Imaging Review No results found. I have personally reviewed and evaluated these images and lab results as part of my medical decision-making.   EKG Interpretation   Date/Time:  Tuesday July 29 2015 05:44:46 EDT Ventricular Rate:  109 PR Interval:    QRS Duration: 118 QT Interval:  370 QTC Calculation: 499 R Axis:   -54 Text Interpretation:  Ventricular-paced complexes Premature ventricular  complexes Baseline wander in lead(s) V3 a fib with RVR resolved Confirmed  by Glynn Octave [phone]) on 07/29/2015 6:25:31 AM      MDM   Final diagnoses:  None    Patient presents emergency department for shortness of breath. I was called to the room emergently for possible ventricular tachycardia. Upon EKG review, patient's rhythm  was a fibrillation with RVR. Upon chart review, he has had this several times in the past. Because his heart failure he is not a candidate for diltiazem. I consult with cardiology, Dr. Lamona Curl, who suggested we jump straight to electrical cardioversion. Patient was successfully cardioverted after one attempt. We'll obtain laboratory studies, which lies, chest x-ray to cover any other etiology of this. Thyroid studies also sent. Patient is to follow with cardiology, will hold until electrophysiology can see him in the morning and review his pacemaker interrogation. If normal, patient is safe for discharge home with close follow-up. Patient was signed out to Dr. Rex Kras for disposition.  CRITICAL CARE Performed by: Everlene Balls   Total critical care time: 40 minutes - afib wih RVR  Critical care time was exclusive of separately billable procedures and treating other patients.  Critical care was necessary to treat or prevent imminent or life-threatening deterioration.  Critical care was time spent personally by me on the following activities: development of treatment plan with patient and/or surrogate as well as nursing, discussions with consultants, evaluation of patient's response to treatment, examination of patient, obtaining history from patient or surrogate, ordering and performing treatments and interventions, ordering and review of laboratory studies,  ordering and review of radiographic studies, pulse oximetry and re-evaluation of patient's condition.     Everlene Balls, MD 07/29/15 [phone]

## 2015-07-29 NOTE — Sedation Documentation (Signed)
Shocked at 200 Joules, NSR

## 2015-07-29 NOTE — Discharge Instructions (Signed)
Atrial Fibrillation Mr. Aaron Thompson, see Dr. Caryl Comes for close follow up of your a fib.  If any symptoms worsen, come back to the ED immediately. Thank you. Atrial fibrillation is a type of heartbeat that is irregular or fast (rapid). If you have this condition, your heart keeps quivering in a weird (chaotic) way. This condition can make it so your heart cannot pump blood normally. Having this condition gives a person more risk for stroke, heart failure, and other heart problems. There are different types of atrial fibrillation. Talk with your doctor to learn about the type that you have. HOME CARE  Take over-the-counter and prescription medicines only as told by your doctor.  If your doctor prescribed a blood-thinning medicine, take it exactly as told. Taking too much of it can cause bleeding. If you do not take enough of it, you will not have the protection that you need against stroke and other problems.  Do not use any tobacco products. These include cigarettes, chewing tobacco, and e-cigarettes. If you need help quitting, ask your doctor.  If you have apnea (obstructive sleep apnea), manage it as told by your doctor.  Do not drink alcohol.  Do not drink beverages that have caffeine. These include coffee, soda, and tea.  Maintain a healthy weight. Do not use diet pills unless your doctor says they are safe for you. Diet pills may make heart problems worse.  Follow diet instructions as told by your doctor.  Exercise regularly as told by your doctor.  Keep all follow-up visits as told by your doctor. This is important. GET HELP IF:  You notice a change in the speed, rhythm, or strength of your heartbeat.  You are taking a blood-thinning medicine and you notice more bruising.  You get tired more easily when you move or exercise. GET HELP RIGHT AWAY IF:  You have pain in your chest or your belly (abdomen).  You have sweating or weakness.  You feel sick to your stomach  (nauseous).  You notice blood in your throw up (vomit), poop (stool), or pee (urine).  You are short of breath.  You suddenly have swollen feet and ankles.  You feel dizzy.  Your suddenly get weak or numb in your face, arms, or legs, especially if it happens on one side of your body.  You have trouble talking, trouble understanding, or both.  Your face or your eyelid droops on one side. These symptoms may be an emergency. Do not wait to see if the symptoms will go away. Get medical help right away. Call your local emergency services (911 in the [place] Do not drive yourself to the hospital.   This information is not intended to replace advice given to you by your health care provider. Make sure you discuss any questions you have with your health care provider.   Document Released: 10/21/2007 Document Revised: 10/02/2014 Document Reviewed: 05/08/2014 Elsevier Interactive Patient Education [employer].

## 2015-07-29 NOTE — ED Provider Notes (Signed)
I received pt in signout from Dr. Claudine Mouton, who had evaluated him for atrial fibrillation with rapid ventricular response. We were awaiting evaluation by the patient's primary electrophysiologist, Dr. Caryl Comes. He evaluated pt in ED and determined that he was safe for discharge based on  Reassuring labs and observation. The patient will follow-up with Dr. Caryl Comes in the clinic. He also discussed with the patient's dietary supplementation of potassium through foods. On reexamination, the patient was well-appearing and ambulatory. Patient discharged in satisfactory condition.  Sharlett Iles, MD 07/29/15 [phone]

## 2015-07-30 NOTE — Progress Notes (Signed)
Cardiology Office Note Date:  07/31/2015  Patient ID:  Aaron Thompson, Aaron Thompson January 04, 1961, MRN [id number] PCP:  [name], MD  Electrophysiologist: Dr. Caryl Comes Cardiologist: Dr. [name]   Chief Complaint: s/p ER visit  History of Present Illness: Aaron Thompson is a 55 y.o. male with history of PAfib, NICM, HTN, morbid obesity, OSA, reports unable to tolerate CPAP, DM, LBBB comes in today to be seen for Dr. Caryl Comes.  He waslast seen out patient by EP service, [name], NP in March.  07/29/15 he was seen in the ER with palpitations/SOB that woke him, he was found to be in AFib with RVR and had DCCV in the ER.  His K+ was 3.9 and instructed to incvrease K+ rish food intake and to have labs in 2 weeks.  He comes in today feeling well, has not felt palpitations, no CP or SOB, no near syncope or syncope.  He has infrequent hemorrhoid bleeding, nothing persistent/only associated with BM, no other bleeding or signs of bleeding.   AFib Hx: ER visits with RVR requiring DCCV Feb and March July 2017, previous DCCV 2011, 2013 Rapid Afib with inapproriate shocks April 2016, Feb 2017 Tikosyn Xarelto Genetic AF trial visit 06/18/15, screen fail, negative for gene  Device History:  STJ CRTD implanted 2015 for NICM History of appropriate therapy: No - inappropriate therapy for AF with RVR History of AAD therapy: Yes - Tikosyn for AF  Past Medical History  Diagnosis Date  . Hypertension   . DJD (degenerative joint disease)   . Dermatophytosis of the body   . Esophageal reflux   . Other chronic nonalcoholic liver disease     fatty liver  . Unspecified hearing loss   . Anxiety state, unspecified   . Non-ischemic cardiomyopathy ([place])     a.  cath 6/11: OM2 30%; EF 30%;  b. echo 2/12: EF 30%, mild MR, mild LAE;  c.  Echo (11/14):  Mod LVH, EF 25-30%, diff HK, mod LAE.   [name] PAF (paroxysmal atrial fibrillation) ([place])     a. s/p DCCV 6/11; previously on Pradaxa;  b. Event Monitor 2012->No PAF;  c. 09/2011  s/p DCCV ->Xarelto started. d. 03/2014: inappropriate ICD shocks for AF-RVR, started on Tikosyn, Xarelto restarted.  [name] LBBB (left bundle branch block)     intermittent  . Hx of colonic polyps   . Osteoarthrosis, unspecified whether generalized or localized, hand   . Chronic systolic CHF (congestive heart failure) ([place])   . OSA on CPAP   . Type II diabetes mellitus ([place])   . Calculus of kidney 1981    "passed it on my own"    Past Surgical History  Procedure Laterality Date  . Neck surgery      plate/fusion  . Bilateral knee arthroscopy    . Stress cardiolite  08/2005    Normal, EF 42%  . Upper gastrointestinal endoscopy  08/2006    GERD  . Abdominal US  01/2007    Fatty liver, no gallstones  . Doppler echocardiography  11/2009    Decreased EF of 35-40%  . Appendectomy    . Cardioversion  09/29/2011    Procedure: CARDIOVERSION;  Surgeon: Thayer Headings, MD;  Location: [place];  Service: Cardiovascular;  Laterality: [name];  . Left heart catheterization with coronary angiogram [name] 08/18/2011    Procedure: LEFT HEART CATHETERIZATION WITH CORONARY ANGIOGRAM;  Surgeon: Peter M Martinique, MD;  [facility] C[facility];  Service: Cardiovascular;  Laterality: [name];  .  Bi-ventricular implantable cardioverter defibrillator [name] 02/14/2013    STJ CRTD implanted by Dr Caryl Comes    Current Outpatient Prescriptions  Medication Sig Dispense Refill  . albuterol (PROVENTIL HFA;VENTOLIN HFA) 108 (90 Base) MCG/ACT inhaler Inhale 2 puffs into the lungs every 4 (four) hours as needed for wheezing or shortness of breath. 1 Inhaler 1  . benzonatate (TESSALON) 200 MG capsule Take 1 capsule (200 mg total) by mouth 3 (three) times daily as needed for cough (swallow whole do not bite pill). 30 capsule 1  . carvedilol (COREG) 25 MG tablet TAKE 1 AND 1/2 TABLETS (37.5 MG TOTAL) BY MOUTH 2 (TWO) TIMES DAILY WITH A MEAL. 270 tablet 1  . dofetilide (TIKOSYN) 500 MCG capsule Take 1 capsule (500 mcg total) by mouth 2 (two) times  daily. 60 capsule 6  . glipiZIDE (GLUCOTROL XL) 5 MG 24 hr tablet Take 1 tablet (5 mg total) by mouth daily with breakfast. 30 tablet 11  . glucose blood (ONE TOUCH ULTRA TEST) test strip CHECK GLUCOSE TWICE DAILY AND AS NEEDED, DX. E11.65 (UNCONTROLLED DM) 100 each 5  . losartan (COZAAR) 50 MG tablet Take 1 tablet (50 mg total) by mouth daily. 30 tablet 5  . metFORMIN (GLUCOPHAGE) 500 MG tablet Take 1 tablet (500 mg total) by mouth 2 (two) times daily with a meal. 60 tablet 11  . Multiple Vitamin (MULTIVITAMIN) tablet Take 1 tablet by mouth daily.      [name] Bicarbonate (ZEGERID OTC) 20-1100 MG CAPS Take 1 capsule by mouth daily.     . potassium chloride SA (KLOR-CON M20) 20 MEQ tablet Take 2 tablets (40 mEq total) by mouth daily. 60 tablet 11  . rivaroxaban (XARELTO) 20 MG TABS tablet Take 1 tablet (20 mg total) by mouth daily with supper. 30 tablet 2  . spironolactone (ALDACTONE) 25 MG tablet Take 1 tablet (25 mg total) by mouth daily. 30 tablet 8   No current facility-administered medications for this visit.    Allergies:   Review of patient's allergies indicates no known allergies.   Social History:  The patient  reports that he has never smoked. He has quit using smokeless tobacco. His smokeless tobacco use included Snuff. He reports that he does not drink alcohol or use illicit drugs.   Family History:  The patient's family history includes Diabetes in his brother, brother, father, and mother; Heart attack in his paternal grandfather; Heart failure in his father and mother; Hypertension in his father and mother; Kidney disease in his brother; Other in his sister; Stroke in his paternal grandmother.  ROS:  Please see the history of present illness.    All other systems are reviewed and otherwise negative.   PHYSICAL EXAM:  VS:  BP 128/92 mmHg  Pulse 80  Ht 6\' 4"  (1.93 m)  Wt 294 lb (133.358 kg)  BMI 35.80 kg/m2 BMI: Body mass index is 35.8 kg/(m^2). Well nourished,  obese, well developed, in no acute distress HEENT: normocephalic, atraumatic Neck: no JVD, carotid bruits or masses Cardiac:  RRR; no significant murmurs, no rubs, or gallops Lungs:  clear to auscultation bilaterally, no wheezing, rhonchi or rales Abd: soft, nontender MS: no deformity or atrophy Ext: no edema Skin: warm and dry, no rash Neuro:  No gross deficits appreciated Psych: euthymic mood, full affect  ICD site is stable, no tethering or discomfort   EKG:  Done today and reviewed by myself shows SR, V paced, QTc 49ms ICD interrogation today: Normal device function, battery status  OK, >99% BiVe pacing, AF burden < 1%, device reprogrammed to include chamber onset discrimiation to try and avoid inappropriate shocks for VT.  04/19/14: Echocardiogram Study Conclusions - Left ventricle: LVEF is severely depressed at approximately 15 to 20% with diffuse hypokinesis. The cavity size was severely dilated. Wall thickness was normal. Doppler parameters are consistent with abnormal left ventricular relaxation (grade 1 diastolic dysfunction). - Mitral valve: There was mild regurgitation. - Left atrium: The atrium was mildly dilated. 58mm - Right ventricle: The cavity size was mildly dilated. Systolic function was moderately to severely reduced.   09/22/11; CPX Conclusion: Exercise testing with gas exchange demonstrated a normal functional capacity when compared to matched sedentary norms. There is no evidence of a cardiovascular limitation to the exercise. Based on his absolute oxygen uptake, the patient is actually in quite good cardiovascular shape however his actual function capacity is markedly limited by his obesity and related ventilatory limitation.  Recent Labs: 04/07/2015: ALT 23 07/29/2015: B Natriuretic Peptide 24.9; BUN 20; Creatinine, Ser 0.78; Hemoglobin 15.5; Magnesium 2.0; Platelets 233; Potassium 3.9; Sodium 137; TSH 2.041  04/07/2015: Cholesterol 149; HDL  29.70*; LDL Cholesterol 94; Total CHOL/HDL Ratio 5; Triglycerides 129.0; VLDL 25.8   Estimated Creatinine Clearance: 159.2 mL/min (by C-G formula based on Cr of 0.78).   Wt Readings from Last 3 Encounters:  07/31/15 294 lb (133.358 kg)  07/29/15 296 lb (134.265 kg)  07/23/15 296 lb (134.265 kg)     Other studies reviewed: Additional studies/records reviewed today include: summarized above  DEVICE information: SJM CRT-D, implanted 12/25/13, Dr. Caryl Comes The patient has had inappropriate shocks for rapid AF  ASSESSMENT AND PLAN:  1. PAFib    CHA2DS2Vasc is at least 3, on Xarelto    Multiple DCCV    On Tikosyn, QTC is stable    07/29/15: K+ 3.9, mag 2.0, creat 0/78, H/H 15/47      2. NICM/ICD     normal device function     99% bive paced     exam is euvolemic     weight is stable     corvue is stable     On BB/ARB  3. HTN     stable  4. Obesity, OSA     reports CPAP unable to tolerate mask, would like to visit with sleep apnea specialist to discuss options, he thinks his AF more often triggered at night  Disposition: BMET in 2 weeks, will refer him to sleep study MD, he has an appointment next month with Dr. [name], will schedule EP f/u in 56months, sooner if needed.   Current medicines are reviewed at length with the patient today.  The patient did not have any concerns regarding medicines.  Haywood Lasso, PA-C 07/31/2015 9:07 AM     [facility] [address] [place] [address] [phone] (office)  [phone] (fax)

## 2015-07-31 ENCOUNTER — Encounter: Payer: Self-pay | Admitting: Physician Assistant

## 2015-07-31 ENCOUNTER — Ambulatory Visit (INDEPENDENT_AMBULATORY_CARE_PROVIDER_SITE_OTHER): Payer: BLUE CROSS/BLUE SHIELD | Admitting: Physician Assistant

## 2015-07-31 VITALS — BP 128/92 | HR 80 | Ht 76.0 in | Wt 294.0 lb

## 2015-07-31 DIAGNOSIS — I10 Essential (primary) hypertension: Secondary | ICD-10-CM

## 2015-07-31 DIAGNOSIS — G4730 Sleep apnea, unspecified: Secondary | ICD-10-CM | POA: Diagnosis not present

## 2015-07-31 DIAGNOSIS — I4891 Unspecified atrial fibrillation: Secondary | ICD-10-CM

## 2015-07-31 DIAGNOSIS — I422 Other hypertrophic cardiomyopathy: Secondary | ICD-10-CM | POA: Diagnosis not present

## 2015-07-31 NOTE — Patient Instructions (Signed)
Medication Instructions:   Your physician recommends that you continue on your current medications as directed. Please refer to the Current Medication list given to you today.   If you need a refill on your cardiac medications before your next appointment, please call your pharmacy.  Labwork: RETURNING  2 WEEKS FOR BMET    Testing/Procedures: NONE ORDER TODAY    Follow-Up:  WITH KLEIN IN 3 MONTHS    AN NEXT AVAILABLE APPT WITH SLEEP DRs TURNER OR KELLY FOR SLEEP APNEA CONSULT   Remote monitoring is used to monitor your Pacemaker of ICD from home. This monitoring reduces the number of office visits required to check your device to one time per year. It allows [place] to keep an eye on the functioning of your device to ensure it is working properly. You are scheduled for a device check from home on . 10/30/2015..You may send your transmission at any time that day. If you have a wireless device, the transmission will be sent automatically. After your physician reviews your transmission, you will receive a postcard with your next transmission date.   Any Other Special Instructions Will Be Listed Below (If Applicable).

## 2015-08-01 NOTE — Addendum Note (Signed)
Addended by: Freada Bergeron on: 08/01/2015 05:45 PM   Modules accepted: Orders

## 2015-08-07 ENCOUNTER — Other Ambulatory Visit: Payer: Self-pay | Admitting: Family Medicine

## 2015-08-08 ENCOUNTER — Other Ambulatory Visit (INDEPENDENT_AMBULATORY_CARE_PROVIDER_SITE_OTHER): Payer: BLUE CROSS/BLUE SHIELD

## 2015-08-08 DIAGNOSIS — IMO0001 Reserved for inherently not codable concepts without codable children: Secondary | ICD-10-CM

## 2015-08-08 DIAGNOSIS — E1165 Type 2 diabetes mellitus with hyperglycemia: Secondary | ICD-10-CM | POA: Diagnosis not present

## 2015-08-08 LAB — HEMOGLOBIN A1C: Hgb A1c MFr Bld: 7.1 % — ABNORMAL HIGH (ref 4.6–6.5)

## 2015-08-11 ENCOUNTER — Ambulatory Visit (INDEPENDENT_AMBULATORY_CARE_PROVIDER_SITE_OTHER): Payer: BLUE CROSS/BLUE SHIELD | Admitting: Family Medicine

## 2015-08-11 ENCOUNTER — Encounter: Payer: Self-pay | Admitting: Family Medicine

## 2015-08-11 VITALS — BP 124/70 | HR 94 | Temp 99.0°F | Ht 76.0 in | Wt 299.5 lb

## 2015-08-11 DIAGNOSIS — I10 Essential (primary) hypertension: Secondary | ICD-10-CM

## 2015-08-11 DIAGNOSIS — E1165 Type 2 diabetes mellitus with hyperglycemia: Secondary | ICD-10-CM

## 2015-08-11 DIAGNOSIS — IMO0001 Reserved for inherently not codable concepts without codable children: Secondary | ICD-10-CM

## 2015-08-11 DIAGNOSIS — E669 Obesity, unspecified: Secondary | ICD-10-CM | POA: Diagnosis not present

## 2015-08-11 MED ORDER — METFORMIN HCL 500 MG PO TABS: ORAL_TABLET | ORAL | Status: DC

## 2015-08-11 NOTE — Progress Notes (Signed)
Subjective:    Patient ID: Aaron Thompson, male    DOB: 16-Feb-1960, 55 y.o.   MRN: [id number]  HPI Here for f/u of chronic health problems   Wt Readings from Last 3 Encounters:  08/11/15 299 lb 8 oz (135.852 kg)  07/31/15 294 lb (133.358 kg)  07/29/15 296 lb (134.265 kg)   bmi is 36.4  Had recent bout of a fib with cardiology f/u (cardioverted in ED)  May be starting cpap soon - ? If mild sleep apnea  Doing better - has to be careful after working outdoors  Also injured leg falling through a floor he was ripping out- healed up and got a tetanus shot   bp is stable today  No cp or palpitations or headaches or edema  No side effects to medicines  BP Readings from Last 3 Encounters:  08/11/15 124/70  07/31/15 128/92  07/29/15 110/74     Diabetes Home sugar results  DM diet -it has been fair- trying really hard to eat less carbs and pick the right carbs  Really wants to loose wt  Exercise - plans to get a bike for exercise  Symptoms-none  A1C last  Lab Results  Component Value Date   HGBA1C 7.1* 08/08/2015  this is  Down from 7.4   No problems with medications -metformin and glipizide (he takes metformin at 11am and 11 pm now )  Renal protection-on cozaar  Last eye exam - 1/17    Patient Active Problem List   Diagnosis Date Noted  . Viral URI with cough 06/30/2015  . Acute bronchitis 04/08/2015  . Hemoptysis 04/08/2015  . Inappropriate shocks from ICD (implantable cardioverter-defibrillator) 03/17/2015  . Hearing loss of right ear due to cerumen impaction 03/13/2015  . Obesity 12/05/2014  . Osteoarthritis of both hands 07/31/2014  . Low serum potassium level 04/21/2014  . ICD (implantable cardioverter-defibrillator) discharge 04/19/2014  . Atrial fibrillation with RVR ([place]) 04/18/2014  . Chronic systolic heart failure ([place]) 02/14/2013  . Hyperlipidemia 02/05/2013  . Diabetes type 2, uncontrolled ([place]) 09/30/2011  . Nonischemic cardiomyopathy ([place]) 08/18/2011    . NSVT (nonsustained ventricular tachycardia) ([place]) 08/18/2011  . Chest pain on exertion 08/17/2011  . Unstable angina ([place]) 08/17/2011  . Type II or unspecified type diabetes mellitus without mention of complication, not stated as uncontrolled 10/06/2010  . Palpitations 05/12/2010  . CARDIOMYOPATHY OTHER DISEASES CLASSIFIED ELSW 07/16/2009  . COLONIC POLYPS 12/09/2008  . OSTEOARTHROSIS UNSPEC WHETHER GEN/LOCALIZED HAND 08/16/2008  . Essential hypertension 05/11/2007  . History of renal calculi 05/11/2007  . FATTY LIVER DISEASE 03/20/2007  . ANXIETY 10/04/2006  . HEARING IMPAIRMENT 10/04/2006  . Atrial fibrillation ([place]) 10/04/2006  . FOLLICULITIS 76/81/1572  . GERD 09/20/2006   Past Medical History  Diagnosis Date  . Hypertension   . DJD (degenerative joint disease)   . Dermatophytosis of the body   . Esophageal reflux   . Other chronic nonalcoholic liver disease     fatty liver  . Unspecified hearing loss   . Anxiety state, unspecified   . Non-ischemic cardiomyopathy ([place])     a.  cath 6/11: OM2 30%; EF 30%;  b. echo 2/12: EF 30%, mild MR, mild LAE;  c.  Echo (11/14):  Mod LVH, EF 25-30%, diff HK, mod LAE.   [name] PAF (paroxysmal atrial fibrillation) ([place])     a. s/p DCCV 6/11; previously on Pradaxa;  b. Event Monitor 2012->No PAF;  c. 09/2011 s/p DCCV ->Xarelto started. d. 03/2014: inappropriate ICD  shocks for AF-RVR, started on Tikosyn, Xarelto restarted.  [name] LBBB (left bundle branch block)     intermittent  . Hx of colonic polyps   . Osteoarthrosis, unspecified whether generalized or localized, hand   . Chronic systolic CHF (congestive heart failure) ([place])   . OSA on CPAP   . Type II diabetes mellitus ([place])   . Calculus of kidney 1981    "passed it on my own"   Past Surgical History  Procedure Laterality Date  . Neck surgery      plate/fusion  . Bilateral knee arthroscopy    . Stress cardiolite  08/2005    Normal, EF 42%  . Upper gastrointestinal endoscopy  08/2006     GERD  . Abdominal US  01/2007    Fatty liver, no gallstones  . Doppler echocardiography  11/2009    Decreased EF of 35-40%  . Appendectomy    . Cardioversion  09/29/2011    Procedure: CARDIOVERSION;  Surgeon: Thayer Headings, MD;  Location: [place];  Service: Cardiovascular;  Laterality: [name];  . Left heart catheterization with coronary angiogram [name] 08/18/2011    Procedure: LEFT HEART CATHETERIZATION WITH CORONARY ANGIOGRAM;  Surgeon: Peter M Martinique, MD;  Location[facility] C[facility] LAB;  Service: Cardiovascular;  Laterality: [name];  . Bi-ventricular implantable cardioverter defibrillator [name] 02/14/2013    STJ CRTD implanted by Dr Caryl Comes   Social History  Substance Use Topics  . Smoking status: Never Smoker   . Smokeless tobacco: Former Systems developer    Types: Snuff     Comment: 02/14/2013 "quit snuff in 2006"  . Alcohol Use: No   Family History  Problem Relation Age of Onset  . Hypertension Mother   . Diabetes Mother   . Hypertension Father   . Diabetes Father   . Diabetes Brother   . Kidney disease Brother   . Diabetes Brother   . Heart failure Mother     Died @ 38  . Heart failure Father     Died @ 47  . Other Sister     4 sisters A&W  . Heart attack Paternal Grandfather   . Stroke Paternal Grandmother   . Multiple myeloma Sister   . Leukemia Sister    No Known Allergies Current Outpatient Prescriptions on File Prior to Visit  Medication Sig Dispense Refill  . albuterol (PROVENTIL HFA;VENTOLIN HFA) 108 (90 Base) MCG/ACT inhaler Inhale 2 puffs into the lungs every 4 (four) hours as needed for wheezing or shortness of breath. 1 Inhaler 1  . benzonatate (TESSALON) 200 MG capsule Take 1 capsule (200 mg total) by mouth 3 (three) times daily as needed for cough (swallow whole do not bite pill). 30 capsule 1  . carvedilol (COREG) 25 MG tablet TAKE 1 AND 1/2 TABLETS (37.5 MG TOTAL) BY MOUTH 2 (TWO) TIMES DAILY WITH A MEAL. 270 tablet 1  . dofetilide (TIKOSYN) 500 MCG capsule Take 1 capsule (500 mcg  total) by mouth 2 (two) times daily. 60 capsule 6  . glipiZIDE (GLUCOTROL XL) 5 MG 24 hr tablet Take 1 tablet (5 mg total) by mouth daily with breakfast. 30 tablet 11  . glucose blood (ONE TOUCH ULTRA TEST) test strip CHECK GLUCOSE TWICE DAILY AND AS NEEDED, DX. E11.65 (UNCONTROLLED DM) 100 each 5  . losartan (COZAAR) 50 MG tablet Take 1 tablet (50 mg total) by mouth daily. 30 tablet 5  . Multiple Vitamin (MULTIVITAMIN) tablet Take 1 tablet by mouth daily.      [name]  Bicarbonate (ZEGERID OTC) 20-1100 MG CAPS Take 1 capsule by mouth daily.     . potassium chloride SA (KLOR-CON M20) 20 MEQ tablet Take 2 tablets (40 mEq total) by mouth daily. 60 tablet 11  . rivaroxaban (XARELTO) 20 MG TABS tablet Take 1 tablet (20 mg total) by mouth daily with supper. 30 tablet 2  . spironolactone (ALDACTONE) 25 MG tablet Take 1 tablet (25 mg total) by mouth daily. 30 tablet 8   No current facility-administered medications on file prior to visit.    Review of Systems Review of Systems  Constitutional: Negative for fever, appetite change, fatigue and unexpected weight change.  Eyes: Negative for pain and visual disturbance.  Respiratory: Negative for cough and shortness of breath.   Cardiovascular: Negative for cp or palpitations   (palpitations are resolved) Gastrointestinal: Negative for nausea, diarrhea and constipation.  Genitourinary: Negative for urgency and frequency.  Skin: Negative for pallor or rash   Neurological: Negative for weakness, light-headedness, numbness and headaches.  Hematological: Negative for adenopathy. Does not bruise/bleed easily.  Psychiatric/Behavioral: Negative for dysphoric mood. The patient is not nervous/anxious.         Objective:   Physical Exam  Constitutional: He appears well-developed and well-nourished. No distress.  obese and well appearing   HENT:  Head: Normocephalic and atraumatic.  Mouth/Throat: Oropharynx is clear and moist.  Eyes:  Conjunctivae and EOM are normal. Pupils are equal, round, and reactive to light.  Neck: Normal range of motion. Neck supple. No JVD present. Carotid bruit is not present. No thyromegaly present.  Cardiovascular: Normal rate, regular rhythm, normal heart sounds and intact distal pulses.  Exam reveals no gallop.   Pulmonary/Chest: Effort normal and breath sounds normal. No respiratory distress. He has no wheezes. He has no rales.  No crackles  Abdominal: Soft. Bowel sounds are normal. He exhibits no distension, no abdominal bruit and no mass. There is no tenderness.  Musculoskeletal: He exhibits no edema.  Lymphadenopathy:    He has no cervical adenopathy.  Neurological: He is alert. He has normal reflexes.  Skin: Skin is warm and dry. No rash noted.  Psychiatric: He has a normal mood and affect.  Pleasant and talkative           Assessment & Plan:   Problem List Items Addressed This Visit      Cardiovascular and Mediastinum   Essential hypertension - Primary    bp in fair control at this time  BP Readings from Last 1 Encounters:  08/11/15 124/70   No changes needed Disc lifstyle change with low sodium diet and exercise   Labs reviewed  Has done well since last a fib bout         Endocrine   Diabetes type 2, uncontrolled ([place])    Lab Results  Component Value Date   HGBA1C 7.1* 08/08/2015   This is slowly improving with metformin and glipizide Eye exam utd  Disc imp of wt loss and plan made for there       Relevant Medications   metFORMIN (GLUCOPHAGE) 500 MG tablet     Other   Obesity    Discussed how this problem influences overall health and the risks it imposes  Reviewed plan for weight loss with lower calorie diet (via better food choices and also portion control or program like weight watchers) and exercise building up to or more than 30 minutes 5 days per week including some aerobic activity  Relevant Medications   metFORMIN (GLUCOPHAGE) 500 MG  tablet

## 2015-08-11 NOTE — Assessment & Plan Note (Signed)
Discussed how this problem influences overall health and the risks it imposes  Reviewed plan for weight loss with lower calorie diet (via better food choices and also portion control or program like weight watchers) and exercise building up to or more than 30 minutes 5 days per week including some aerobic activity    

## 2015-08-11 NOTE — Progress Notes (Signed)
Pre visit review using our clinic review tool, if applicable. No additional management support is needed unless otherwise documented below in the visit note. 

## 2015-08-11 NOTE — Assessment & Plan Note (Signed)
bp in fair control at this time  BP Readings from Last 1 Encounters:  08/11/15 124/70   No changes needed Disc lifstyle change with low sodium diet and exercise   Labs reviewed  Has done well since last a fib bout

## 2015-08-11 NOTE — Assessment & Plan Note (Signed)
Lab Results  Component Value Date   HGBA1C 7.1* 08/08/2015   This is slowly improving with metformin and glipizide Eye exam utd  Disc imp of wt loss and plan made for there

## 2015-08-11 NOTE — Patient Instructions (Addendum)
A1C is better- at 7.1 (goal is 7) Keep working on weight loss( more exercise and less calories)-spread protein and carbs out through the day  Do consider getting a bike  Blood pressure is good  Follow up in 6 months for annual exam with lab prior

## 2015-08-12 ENCOUNTER — Encounter (HOSPITAL_COMMUNITY): Payer: Self-pay | Admitting: Emergency Medicine

## 2015-08-12 ENCOUNTER — Encounter (HOSPITAL_COMMUNITY): Payer: Self-pay | Admitting: Nurse Practitioner

## 2015-08-12 ENCOUNTER — Emergency Department (HOSPITAL_COMMUNITY): Payer: BLUE CROSS/BLUE SHIELD

## 2015-08-12 ENCOUNTER — Observation Stay (HOSPITAL_COMMUNITY)
Admission: EM | Admit: 2015-08-12 | Discharge: 2015-08-15 | Disposition: A | Discharge status: Home / Self Care | Payer: BLUE CROSS/BLUE SHIELD | Attending: Cardiovascular Disease | Admitting: Cardiovascular Disease

## 2015-08-12 ENCOUNTER — Emergency Department (HOSPITAL_COMMUNITY)
Admission: EM | Admit: 2015-08-12 | Discharge: 2015-08-12 | Disposition: A | Discharge status: Home / Self Care | Payer: BLUE CROSS/BLUE SHIELD | Source: Home / Self Care | Attending: Emergency Medicine | Admitting: Emergency Medicine

## 2015-08-12 DIAGNOSIS — Z7901 Long term (current) use of anticoagulants: Secondary | ICD-10-CM | POA: Insufficient documentation

## 2015-08-12 DIAGNOSIS — I429 Cardiomyopathy, unspecified: Secondary | ICD-10-CM | POA: Diagnosis not present

## 2015-08-12 DIAGNOSIS — I472 Ventricular tachycardia: Secondary | ICD-10-CM | POA: Diagnosis present

## 2015-08-12 DIAGNOSIS — Z79899 Other long term (current) drug therapy: Secondary | ICD-10-CM

## 2015-08-12 DIAGNOSIS — Z95810 Presence of automatic (implantable) cardiac defibrillator: Secondary | ICD-10-CM | POA: Insufficient documentation

## 2015-08-12 DIAGNOSIS — I4891 Unspecified atrial fibrillation: Secondary | ICD-10-CM | POA: Insufficient documentation

## 2015-08-12 DIAGNOSIS — Z8679 Personal history of other diseases of the circulatory system: Secondary | ICD-10-CM

## 2015-08-12 DIAGNOSIS — Z7984 Long term (current) use of oral hypoglycemic drugs: Secondary | ICD-10-CM | POA: Insufficient documentation

## 2015-08-12 DIAGNOSIS — Z6834 Body mass index (BMI) 34.0-34.9, adult: Secondary | ICD-10-CM | POA: Diagnosis not present

## 2015-08-12 DIAGNOSIS — E876 Hypokalemia: Secondary | ICD-10-CM | POA: Diagnosis not present

## 2015-08-12 DIAGNOSIS — I428 Other cardiomyopathies: Secondary | ICD-10-CM

## 2015-08-12 DIAGNOSIS — I110 Hypertensive heart disease with heart failure: Secondary | ICD-10-CM

## 2015-08-12 DIAGNOSIS — I447 Left bundle-branch block, unspecified: Secondary | ICD-10-CM | POA: Insufficient documentation

## 2015-08-12 DIAGNOSIS — Z8249 Family history of ischemic heart disease and other diseases of the circulatory system: Secondary | ICD-10-CM | POA: Insufficient documentation

## 2015-08-12 DIAGNOSIS — I480 Paroxysmal atrial fibrillation: Principal | ICD-10-CM | POA: Insufficient documentation

## 2015-08-12 DIAGNOSIS — E119 Type 2 diabetes mellitus without complications: Secondary | ICD-10-CM

## 2015-08-12 DIAGNOSIS — I5022 Chronic systolic (congestive) heart failure: Secondary | ICD-10-CM

## 2015-08-12 DIAGNOSIS — E669 Obesity, unspecified: Secondary | ICD-10-CM | POA: Diagnosis not present

## 2015-08-12 DIAGNOSIS — G4733 Obstructive sleep apnea (adult) (pediatric): Secondary | ICD-10-CM | POA: Diagnosis not present

## 2015-08-12 DIAGNOSIS — R000 Tachycardia, unspecified: Secondary | ICD-10-CM

## 2015-08-12 DIAGNOSIS — I10 Essential (primary) hypertension: Secondary | ICD-10-CM | POA: Diagnosis present

## 2015-08-12 LAB — CBC WITH DIFFERENTIAL/PLATELET
BASOS PCT: 0 %
Basophils Absolute: 0 10*3/uL (ref 0.0–0.1)
Eosinophils Absolute: 0.2 10*3/uL (ref 0.0–0.7)
Eosinophils Relative: 2 %
HEMATOCRIT: 47.1 % (ref 39.0–52.0)
HEMOGLOBIN: 15.8 g/dL (ref 13.0–17.0)
Lymphocytes Relative: 28 %
Lymphs Abs: 2.8 10*3/uL (ref 0.7–4.0)
MCH: 29.1 pg (ref 26.0–34.0)
MCHC: 33.5 g/dL (ref 30.0–36.0)
MCV: 86.7 fL (ref 78.0–100.0)
MONOS PCT: 9 %
Monocytes Absolute: 0.9 10*3/uL (ref 0.1–1.0)
NEUTROS ABS: 6.1 10*3/uL (ref 1.7–7.7)
NEUTROS PCT: 61 %
Platelets: 249 10*3/uL (ref 150–400)
RBC: 5.43 MIL/uL (ref 4.22–5.81)
RDW: 13.6 % (ref 11.5–15.5)
WBC: 10.1 10*3/uL (ref 4.0–10.5)

## 2015-08-12 LAB — I-STAT CHEM 8, ED
BUN: 18 mg/dL (ref 6–20)
CALCIUM ION: 1.02 mmol/L — AB (ref 1.13–1.30)
CHLORIDE: 105 mmol/L (ref 101–111)
Creatinine, Ser: 0.6 mg/dL — ABNORMAL LOW (ref 0.61–1.24)
Glucose, Bld: 250 mg/dL — ABNORMAL HIGH (ref 65–99)
HEMATOCRIT: 45 % (ref 39.0–52.0)
Hemoglobin: 15.3 g/dL (ref 13.0–17.0)
POTASSIUM: 4.4 mmol/L (ref 3.5–5.1)
SODIUM: 139 mmol/L (ref 135–145)
TCO2: 21 mmol/L (ref 0–100)

## 2015-08-12 LAB — CBC
HEMATOCRIT: 44 % (ref 39.0–52.0)
HEMOGLOBIN: 15 g/dL (ref 13.0–17.0)
MCH: 29.4 pg (ref 26.0–34.0)
MCHC: 34.1 g/dL (ref 30.0–36.0)
MCV: 86.3 fL (ref 78.0–100.0)
Platelets: 211 10*3/uL (ref 150–400)
RBC: 5.1 MIL/uL (ref 4.22–5.81)
RDW: 13.8 % (ref 11.5–15.5)
WBC: 10.7 10*3/uL — AB (ref 4.0–10.5)

## 2015-08-12 LAB — BASIC METABOLIC PANEL
ANION GAP: 10 (ref 5–15)
ANION GAP: 7 (ref 5–15)
BUN: 16 mg/dL (ref 6–20)
BUN: 16 mg/dL (ref 6–20)
CALCIUM: 8.9 mg/dL (ref 8.9–10.3)
CHLORIDE: 101 mmol/L (ref 101–111)
CHLORIDE: 103 mmol/L (ref 101–111)
CO2: 25 mmol/L (ref 22–32)
CO2: 27 mmol/L (ref 22–32)
Calcium: 8.9 mg/dL (ref 8.9–10.3)
Creatinine, Ser: 0.84 mg/dL (ref 0.61–1.24)
Creatinine, Ser: 0.87 mg/dL (ref 0.61–1.24)
GFR calc non Af Amer: >60 mL/min (ref >60)
GFR calc non Af Amer: >60 mL/min (ref >60)
Glucose, Bld: 220 mg/dL — ABNORMAL HIGH (ref 65–99)
Glucose, Bld: 174 mg/dL — ABNORMAL HIGH (ref 65–99)
POTASSIUM: 3.6 mmol/L (ref 3.5–5.1)
Potassium: 5.9 mmol/L — ABNORMAL HIGH (ref 3.5–5.1)
Sodium: 136 mmol/L (ref 135–145)
Sodium: 137 mmol/L (ref 135–145)

## 2015-08-12 LAB — I-STAT TROPONIN, ED: Troponin i, poc: 0 ng/mL (ref 0.00–0.08)

## 2015-08-12 LAB — TROPONIN I: Troponin I: <0.03 ng/mL (ref <0.03)

## 2015-08-12 LAB — MAGNESIUM: Magnesium: 2.3 mg/dL (ref 1.7–2.4)

## 2015-08-12 MED ORDER — SODIUM CHLORIDE 0.9% FLUSH
3.0000 mL | Freq: Two times a day (BID) | INTRAVENOUS | Status: DC
  Administered 2015-08-13 – 2015-08-15 (×5): 3 mL via INTRAVENOUS

## 2015-08-12 MED ORDER — ALBUTEROL SULFATE (2.5 MG/3ML) 0.083% IN NEBU: 3.0000 mL | INHALATION_SOLUTION | RESPIRATORY_TRACT | Status: DC | PRN

## 2015-08-12 MED ORDER — ACETAMINOPHEN 325 MG PO TABS: 650.0000 mg | ORAL_TABLET | ORAL | Status: DC | PRN

## 2015-08-12 MED ORDER — ADULT MULTIVITAMIN W/MINERALS CH
1.0000 | ORAL_TABLET | Freq: Every day | ORAL | Status: DC
  Administered 2015-08-13 – 2015-08-15 (×3): 1 via ORAL
  Filled 2015-08-12 (×3): qty 1

## 2015-08-12 MED ORDER — PANTOPRAZOLE SODIUM 40 MG PO TBEC
40.0000 mg | DELAYED_RELEASE_TABLET | Freq: Every day | ORAL | Status: DC
  Administered 2015-08-13 – 2015-08-15 (×3): 40 mg via ORAL
  Filled 2015-08-12 (×3): qty 1

## 2015-08-12 MED ORDER — SPIRONOLACTONE 25 MG PO TABS
25.0000 mg | ORAL_TABLET | Freq: Every day | ORAL | Status: DC
Start: 2015-08-13 — End: 2015-08-15
  Administered 2015-08-13 – 2015-08-15 (×3): 25 mg via ORAL
  Filled 2015-08-12 (×3): qty 1

## 2015-08-12 MED ORDER — DOFETILIDE 500 MCG PO CAPS
500.0000 ug | ORAL_CAPSULE | Freq: Two times a day (BID) | ORAL | Status: DC
  Administered 2015-08-12 – 2015-08-15 (×5): 500 ug via ORAL
  Filled 2015-08-12 (×7): qty 1

## 2015-08-12 MED ORDER — CARVEDILOL 25 MG PO TABS
37.5000 mg | ORAL_TABLET | Freq: Two times a day (BID) | ORAL | Status: DC
  Administered 2015-08-12 – 2015-08-15 (×6): 37.5 mg via ORAL
  Filled 2015-08-12: qty 1
  Filled 2015-08-12: qty 3
  Filled 2015-08-12 (×4): qty 1

## 2015-08-12 MED ORDER — ONDANSETRON HCL 4 MG/2ML IJ SOLN: 4.0000 mg | Freq: Four times a day (QID) | INTRAMUSCULAR | Status: DC | PRN

## 2015-08-12 MED ORDER — ETOMIDATE 2 MG/ML IV SOLN
14.0000 mg | Freq: Once | INTRAVENOUS | Status: AC
  Administered 2015-08-12: 14 mg via INTRAVENOUS
  Filled 2015-08-12: qty 10

## 2015-08-12 MED ORDER — SODIUM CHLORIDE 0.9 % IV SOLN
250.0000 mL | INTRAVENOUS | Status: DC | PRN
Start: 2015-08-12 — End: 2015-08-15

## 2015-08-12 MED ORDER — SODIUM CHLORIDE 0.9% FLUSH
3.0000 mL | INTRAVENOUS | Status: DC | PRN
Start: 2015-08-12 — End: 2015-08-15

## 2015-08-12 MED ORDER — POTASSIUM CHLORIDE CRYS ER 20 MEQ PO TBCR
40.0000 meq | EXTENDED_RELEASE_TABLET | Freq: Once | ORAL | Status: AC
  Administered 2015-08-12: 40 meq via ORAL
  Filled 2015-08-12: qty 2

## 2015-08-12 MED ORDER — DILTIAZEM HCL 25 MG/5ML IV SOLN
15.0000 mg | Freq: Once | INTRAVENOUS | Status: AC
  Administered 2015-08-12: 15 mg via INTRAVENOUS
  Filled 2015-08-12: qty 5

## 2015-08-12 MED ORDER — ASPIRIN 81 MG PO CHEW
324.0000 mg | CHEWABLE_TABLET | Freq: Once | ORAL | Status: AC
  Administered 2015-08-12: 324 mg via ORAL
  Filled 2015-08-12: qty 4

## 2015-08-12 MED ORDER — GLIPIZIDE ER 5 MG PO TB24
5.0000 mg | ORAL_TABLET | Freq: Every day | ORAL | Status: DC
  Administered 2015-08-13 – 2015-08-15 (×3): 5 mg via ORAL
  Filled 2015-08-12 (×3): qty 1

## 2015-08-12 MED ORDER — LOSARTAN POTASSIUM 50 MG PO TABS
50.0000 mg | ORAL_TABLET | Freq: Every day | ORAL | Status: DC
  Administered 2015-08-13 – 2015-08-15 (×3): 50 mg via ORAL
  Filled 2015-08-12 (×3): qty 1

## 2015-08-12 MED ORDER — POTASSIUM CHLORIDE CRYS ER 20 MEQ PO TBCR
40.0000 meq | EXTENDED_RELEASE_TABLET | Freq: Every day | ORAL | Status: DC
  Administered 2015-08-13 – 2015-08-15 (×3): 40 meq via ORAL
  Filled 2015-08-12 (×3): qty 2

## 2015-08-12 MED ORDER — NITROGLYCERIN 0.4 MG SL SUBL: 0.4000 mg | SUBLINGUAL_TABLET | SUBLINGUAL | Status: DC | PRN

## 2015-08-12 MED ORDER — RIVAROXABAN 20 MG PO TABS
20.0000 mg | ORAL_TABLET | Freq: Every day | ORAL | Status: DC
  Administered 2015-08-12 – 2015-08-14 (×3): 20 mg via ORAL
  Filled 2015-08-12 (×3): qty 1

## 2015-08-12 NOTE — ED Notes (Signed)
Pt. reports palpitations , mild SOB/chest discomfort onset this morning , HR= 170's-180's /min at arrival .

## 2015-08-12 NOTE — ED Notes (Signed)
He was discharged this morning after cardioversion for afib. He was sitting in bed at home this afternoon and began to feel his heart beating irregularly and SOB. He denies pain. He is alert, breathing easily

## 2015-08-12 NOTE — ED Notes (Signed)
Patient denies pain, remains in controlled HR. Alert and oriented, family remains at bedside. Dr. Lacinda Axon back at bedside for disposition

## 2015-08-12 NOTE — ED Provider Notes (Signed)
CSN: [id number]     Arrival date & time 08/12/15  8546 History   First MD Initiated Contact with Patient 08/12/15 [phone]     Chief Complaint  Patient presents with  . Palpitations     (Consider location/radiation/quality/duration/timing/severity/associated sxs/prior Treatment) HPI Comments: Patient with extensive past medical history including atrial fibrillation, nonischemic cardiomyopathy, pacemaker, sCHF who p/w palpitations. Just prior to arrival, the patient woke up feeling heart palpitations. He checked his pulse and noticed that it was racing. He endorses some mild shortness of breath but denies any chest pain. He took a medication at home as instructed by his cardiologist in place the magnet on his chest with no relief of his symptoms. No defibrillations at home. He had a similar episode on 7/4 for which he was cardioverted in the ED. he states that he has been taking all of his medications including Xarelto as prescribed. He denies any fevers, cough/cold symptoms, or recent illness.  Patient is a 55 y.o. male presenting with palpitations. The history is provided by the patient.  Palpitations   Past Medical History  Diagnosis Date  . Hypertension   . DJD (degenerative joint disease)   . Dermatophytosis of the body   . Esophageal reflux   . Other chronic nonalcoholic liver disease     fatty liver  . Unspecified hearing loss   . Anxiety state, unspecified   . Non-ischemic cardiomyopathy ([place])     a.  cath 6/11: OM2 30%; EF 30%;  b. echo 2/12: EF 30%, mild MR, mild LAE;  c.  Echo (11/14):  Mod LVH, EF 25-30%, diff HK, mod LAE.   [name] PAF (paroxysmal atrial fibrillation) ([place])     a. s/p DCCV 6/11; previously on Pradaxa;  b. Event Monitor 2012->No PAF;  c. 09/2011 s/p DCCV ->Xarelto started. d. 03/2014: inappropriate ICD shocks for AF-RVR, started on Tikosyn, Xarelto restarted.  [name] LBBB (left bundle branch block)     intermittent  . Hx of colonic polyps   . Osteoarthrosis, unspecified  whether generalized or localized, hand   . Chronic systolic CHF (congestive heart failure) ([place])   . OSA on CPAP   . Type II diabetes mellitus ([place])   . Calculus of kidney 1981    "passed it on my own"   Past Surgical History  Procedure Laterality Date  . Neck surgery      plate/fusion  . Bilateral knee arthroscopy    . Stress cardiolite  08/2005    Normal, EF 42%  . Upper gastrointestinal endoscopy  08/2006    GERD  . Abdominal US  01/2007    Fatty liver, no gallstones  . Doppler echocardiography  11/2009    Decreased EF of 35-40%  . Appendectomy    . Cardioversion  09/29/2011    Procedure: CARDIOVERSION;  Surgeon: Thayer Headings, MD;  Location: [facility];  Service: Cardiovascular;  Laterality: [name];  . Left heart catheterization with coronary angiogram [name] 08/18/2011    Procedure: LEFT HEART CATHETERIZATION WITH CORONARY ANGIOGRAM;  Surgeon: Peter M Martinique, MD;  Location: [facility] CATH LAB;  Service: Cardiovascular;  Laterality: [name];  . Bi-ventricular implantable cardioverter defibrillator [name] 02/14/2013    STJ CRTD implanted by Dr Caryl Comes   Family History  Problem Relation Age of Onset  . Hypertension Mother   . Diabetes Mother   . Hypertension Father   . Diabetes Father   . Diabetes Brother   . Kidney disease Brother   . Diabetes Brother   . Heart  failure Mother     Died @ 45  . Heart failure Father     Died @ 81  . Other Sister     4 sisters A&W  . Heart attack Paternal Grandfather   . Stroke Paternal Grandmother   . Multiple myeloma Sister   . Leukemia Sister    Social History  Substance Use Topics  . Smoking status: Never Smoker   . Smokeless tobacco: Former Systems developer    Types: Snuff     Comment: 02/14/2013 "quit snuff in 2006"  . Alcohol Use: No    Review of Systems  Cardiovascular: Positive for palpitations.   10 Systems reviewed and are negative for acute change except as noted in the HPI.    Allergies  Review of patient's allergies indicates no known  allergies.  Home Medications   Prior to Admission medications   Medication Sig Start Date End Date Taking? Authorizing Provider  albuterol (PROVENTIL HFA;VENTOLIN HFA) 108 (90 Base) MCG/ACT inhaler Inhale 2 puffs into the lungs every 4 (four) hours as needed for wheezing or shortness of breath. 04/08/15  Yes Abner Greenspan, MD  benzonatate (TESSALON) 200 MG capsule Take 1 capsule (200 mg total) by mouth 3 (three) times daily as needed for cough (swallow whole do not bite pill). 06/30/15  Yes Abner Greenspan, MD  carvedilol (COREG) 25 MG tablet TAKE 1 AND 1/2 TABLETS (37.5 MG TOTAL) BY MOUTH 2 (TWO) TIMES DAILY WITH A MEAL. 03/26/15  Yes Amber Sena Slate, NP  dofetilide (TIKOSYN) 500 MCG capsule Take 1 capsule (500 mcg total) by mouth 2 (two) times daily. 03/26/15  Yes Amber Sena Slate, NP  glipiZIDE (GLUCOTROL XL) 5 MG 24 hr tablet Take 1 tablet (5 mg total) by mouth daily with breakfast. 12/03/14  Yes Abner Greenspan, MD  losartan (COZAAR) 50 MG tablet Take 1 tablet (50 mg total) by mouth daily. 03/26/15  Yes Amber Sena Slate, NP  metFORMIN (GLUCOPHAGE) 500 MG tablet TAKE 1 TABLET BY MOUTH TWICE DAILY WITH A MEAL 08/11/15  Yes Abner Greenspan, MD  Multiple Vitamin (MULTIVITAMIN) tablet Take 1 tablet by mouth daily.     Yes Historical Provider, MD  Omeprazole-Sodium Bicarbonate (ZEGERID OTC) 20-1100 MG CAPS Take 1 capsule by mouth daily.    Yes Historical Provider, MD  potassium chloride SA (KLOR-CON M20) 20 MEQ tablet Take 2 tablets (40 mEq total) by mouth daily. 03/26/15  Yes Amber Sena Slate, NP  rivaroxaban (XARELTO) 20 MG TABS tablet Take 1 tablet (20 mg total) by mouth daily with supper. 03/26/15  Yes Amber Sena Slate, NP  spironolactone (ALDACTONE) 25 MG tablet Take 1 tablet (25 mg total) by mouth daily. 03/26/15  Yes Amber Sena Slate, NP  glucose blood (ONE TOUCH ULTRA TEST) test strip CHECK GLUCOSE TWICE DAILY AND AS NEEDED, DX. E11.65 (UNCONTROLLED DM) 03/27/15   Wynelle Fanny Tower, MD   BP 146/83 mmHg  Pulse 92  Temp(Src)  97.9 F (36.6 C) (Oral)  Resp 22  SpO2 98% Physical Exam  Constitutional: He is oriented to person, place, and time. He appears well-developed and well-nourished. No distress.  Awake and alert, pleasant  HENT:  Head: Normocephalic and atraumatic.  Moist mucous membranes  Eyes: Conjunctivae are normal. Pupils are equal, round, and reactive to light.  Neck: Neck supple.  Cardiovascular: Normal heart sounds.  An irregularly irregular rhythm present. Tachycardia present.   No murmur heard. Pulmonary/Chest: Effort normal and breath sounds normal.  Magnet over L chest  Abdominal: Soft. Bowel sounds are normal. He exhibits no distension. There is no tenderness.  Musculoskeletal: He exhibits no edema.  Neurological: He is alert and oriented to person, place, and time.  Fluent speech  Skin: Skin is warm and dry.  Psychiatric: He has a normal mood and affect. Judgment normal.  Nursing note and vitals reviewed.   ED Course  .Cardioversion Date/Time: 08/12/2015 8:00 AM Performed by: Sharlett Iles Authorized by: Sharlett Iles Consent: Verbal consent obtained. Written consent obtained. Risks and benefits: risks, benefits and alternatives were discussed Consent given by: patient Patient understanding: patient states understanding of the procedure being performed Patient consent: the patient's understanding of the procedure matches consent given Procedure consent: procedure consent matches procedure scheduled Required items: required blood products, implants, devices, and special equipment available Patient identity confirmed: verbally with patient Time out: Immediately prior to procedure a "time out" was called to verify the correct patient, procedure, equipment, support staff and site/side marked as required. Patient sedated: yes Sedatives: etomidate Vitals: Vital signs were monitored during sedation. Cardioversion basis: emergent Pre-procedure rhythm: atrial  fibrillation Patient position: patient was placed in a supine position Chest area: chest area exposed Electrodes: pads Electrodes placed: anterior-posterior Number of attempts: 1 Attempt 1 mode: synchronous Attempt 1 waveform: biphasic Attempt 1 shock (in Joules): 200 Attempt 1 outcome: conversion to normal sinus rhythm Post-procedure rhythm: normal sinus rhythm Complications: no complications Patient tolerance: Patient tolerated the procedure well with no immediate complications   (including critical care time)   Procedural sedation Performed by: Wenda Overland Kiki Bivens Consent: Verbal consent obtained. Risks and benefits: risks, benefits and alternatives were discussed Required items: required blood products, implants, devices, and special equipment available Patient identity confirmed: arm band and provided demographic data Time out: Immediately prior to procedure a "time out" was called to verify the correct patient, procedure, equipment, support staff and site/side marked as required.  Sedation type: moderate (conscious) sedation NPO time confirmed and considedered  Sedatives: ETOMIDATE  Physician Time at Bedside: 15 minutes  Vitals: Vital signs were monitored during sedation. Cardiac Monitor, pulse oximeter Patient tolerance: Patient tolerated the procedure well with no immediate complications. Comments: Pt with uneventful recovered. Returned to pre-procedural sedation baseline   CRITICAL CARE Performed by: Wenda Overland Kayliah Tindol   Total critical care time: 45 minutes  Critical care time was exclusive of separately billable procedures and treating other patients.  Critical care was necessary to treat or prevent imminent or life-threatening deterioration.  Critical care was time spent personally by me on the following activities: development of treatment plan with patient and/or surrogate as well as nursing, discussions with consultants, evaluation of patient's response  to treatment, examination of patient, obtaining history from patient or surrogate, ordering and performing treatments and interventions, ordering and review of laboratory studies, ordering and review of radiographic studies, pulse oximetry and re-evaluation of patient's condition.   Labs Review Labs Reviewed  BASIC METABOLIC PANEL - Abnormal; Notable for the following:    Potassium 5.9 (*)    Glucose, Bld 220 (*)    All other components within normal limits  CBC WITH DIFFERENTIAL/PLATELET  MAGNESIUM  I-STAT CHEM 8, ED    Imaging Review Dg Chest Portable 1 View  08/12/2015  CLINICAL DATA:  Atrial fibrillation EXAM: PORTABLE CHEST 1 VIEW COMPARISON:  07/29/2015 FINDINGS: Biventricular pacer from the left with leads in stable position. No cardiomegaly. Stable aortic and hilar contours. There is no edema, consolidation, effusion, or pneumothorax. Artifact over the left chest from  magnet over pacer battery pack. IMPRESSION: No evidence of active disease. Electronically Signed   By: Monte Fantasia M.D.   On: 08/12/2015 06:33   I have personally reviewed and evaluated these lab results as part of my medical decision-making.   EKG Interpretation   Date/Time:  Tuesday August 12 2015 07:53:45 EDT Ventricular Rate:  93 PR Interval:    QRS Duration: 118 QT Interval:  428 QTC Calculation: 533 R Axis:   118 Text Interpretation:  Accelerated junctional rhythm Left posterior  fascicular block Low voltage, precordial leads since previous tracing, A  fib with RVR has converted to sinus rhythm Confirmed by Jishnu Jenniges MD, Keatin Benham  [id number]) on 08/12/2015 7:59:13 AM     Medications  aspirin chewable tablet 324 mg (324 mg Oral Given 08/12/15 0602)  diltiazem (CARDIZEM) injection 15 mg (15 mg Intravenous Given 08/12/15 0625)  etomidate (AMIDATE) injection 14 mg (14 mg Intravenous Given 08/12/15 0742)    MDM   Final diagnoses:  None   Pt with history of atrial fibrillation on Xarelto p/w heart  palpitations and shortness of breath that woke him from sleep tonight. On arrival, he was awake and alert, in no acute distress. Heart rate variable in the 170s to 180s. EKG shows irregularly irregular wide complex rhythm. Patient does have a history of LBBB and A fib w/ RVR. Placed patient on pacer pads and gave aspirin. Gave 39m IV diltiazem w/ mild improvement in rate but pt continues to be in A fib w/ RVR. Discussed w/ cardiology, Dr. NJohnsie Cancel who has recommended cardioversion in ED given that patient is already anticoagulated.  Cardioversion performed under procedural sedation with etomidate. See procedure notes for details. Patient converted to sinus rhythm, rate in the 90s. Labwork notable only for potassium of 5.9 which I suspect is due to hemolysis, repeat is sent. We will observe the patient for one hour and follow-up his repeat potassium. If he remains stable in sinus rhythm, he will be able to go home with close follow-up with his cardiologist.   RSharlett Iles MD 08/12/15 [id number]

## 2015-08-12 NOTE — H&P (Signed)
Cardiology History & Physical    Patient ID: Aaron Thompson MRN: [id number], DOB: [birth year] Date of Encounter: 08/12/2015, 7:59 PM Primary Physician: [name], MD Electrophysiologist: Dr. Caryl Comes Cardiologist: Dr. [name]  Chief Complaint: chest pressure/SOB Reason for Admission: WCT Requesting MD: Dr. Stark Jock  HPI: Aaron Thompson is a 55 y/o M with history of hypertension, non-ischemic cardiomyopathy s/p St. Jude CRT-D (minor CAD by cath 2013, EF 15-20%), paroxysmal atrial fibrillation (h/o inappropriate ICD shocks for this, on Tikosyn), chronic systolic heart failure, sleep apnea (on CPAP), diabetes, obesity, intermittent LBBB, NSVT who presented to [facility] with recurrent chest pressure/SOB c/w his prior arrhythmias.  Per review of chart he has h/o inappropriate ICD shock for AF in 03/2014 at which time he was started on Tikosyn. Last echo 03/2014: EF 15-20%, grade 1 DD, mild MR, mod-sev reduced RV function. He had recurrence of inappropriate therapy in 02/2015 for persistent AF and subsequently underwent DCCV. He was seen in the ED 07/29/15 with recurrent AF RVR and underwent DCCV. K was 3.9 at that time and he was advised to increase potassium rich foods.  He was seen in the ER this AM with recurrent tachycardia. He woke up this morning at 5:15am with sx of chest pressure/dyspnea similar to prior arrhythmias. He says in the past he's been told by the EP team to hold a magnet over his chest when this is happening (with the intention that if he were to have true VT, he would pass out and the device would shock him). His sx persisted so he came to the ER. This was felt to represent AF per notes. He was treated with diltiazem without improvement. Case was discussed with Dr. [name] who advised DCCV in the ER. Workup= notable for K 4.4, Mg 2.3 (initial K 5.9 error - hemolyzed), CXR no active disease. He went home but returned again this evening with recurrent sensation of rapid HR as above. This has been  paroxysmal in nature. When he is in normal rhythm the symptoms resolves. Labs notable for WBC 10.7, otherwise K 3.6, troponin neg x1. Telemetry shows episodic wide complex tachycardia which appears paced.  He has not had any change in his medications.  He has not been taking any herbal products and has not had any alcohol use or any other thing or medication that might have caused worsening atrial fibrillation.   Past Medical History  Diagnosis Date  . Hypertension   . DJD (degenerative joint disease)   . Dermatophytosis of the body   . Esophageal reflux   . Other chronic nonalcoholic liver disease     fatty liver  . Unspecified hearing loss   . Anxiety state, unspecified   . Non-ischemic cardiomyopathy ([place])     a.  cath 6/11: OM2 30%; EF 30%;  b. echo 2/12: EF 30%, mild MR, mild LAE;  c.  Echo (11/14):  Mod LVH, EF 25-30%, diff HK, mod LAE.   [name] PAF (paroxysmal atrial fibrillation) ([place])     a. s/p DCCV 6/11; previously on Pradaxa;  b. Event Monitor 2012->No PAF;  c. 09/2011 s/p DCCV ->Xarelto started. d. 03/2014: inappropriate ICD shocks for AF-RVR, started on Tikosyn, Xarelto restarted.  [name] LBBB (left bundle branch block)     intermittent  . Hx of colonic polyps   . Osteoarthrosis, unspecified whether generalized or localized, hand   . Chronic systolic CHF (congestive heart failure) ([place])   . OSA on CPAP   . Type II diabetes mellitus ([place])   .  Calculus of kidney 1981    "passed it on my own"     Surgical History:  Past Surgical History  Procedure Laterality Date  . Neck surgery      plate/fusion  . Bilateral knee arthroscopy    . Stress cardiolite  08/2005    Normal, EF 42%  . Upper gastrointestinal endoscopy  08/2006    GERD  . Abdominal US  01/2007    Fatty liver, no gallstones  . Doppler echocardiography  11/2009    Decreased EF of 35-40%  . Appendectomy    . Cardioversion  09/29/2011    Procedure: CARDIOVERSION;  Surgeon: Thayer Headings, MD;  Location: [place];   Service: Cardiovascular;  Laterality: [name];  . Left heart catheterization with coronary angiogram [name] 08/18/2011    Procedure: LEFT HEART CATHETERIZATION WITH CORONARY ANGIOGRAM;  Surgeon: Peter M Martinique, MD;  Location: [facility] C[facility] LAB;  Service: Cardiovascular;  Laterality: [name];  . Bi-ventricular implantable cardioverter defibrillator [name] 02/14/2013    STJ CRTD implanted by Dr Caryl Comes     Home Meds: Prior to Admission medications   Medication Sig Start Date End Date Taking? Authorizing Provider  albuterol (PROVENTIL HFA;VENTOLIN HFA) 108 (90 Base) MCG/ACT inhaler Inhale 2 puffs into the lungs every 4 (four) hours as needed for wheezing or shortness of breath. 04/08/15   Abner Greenspan, MD  benzonatate (TESSALON) 200 MG capsule Take 1 capsule (200 mg total) by mouth 3 (three) times daily as needed for cough (swallow whole do not bite pill). 06/30/15   Abner Greenspan, MD  carvedilol (COREG) 25 MG tablet TAKE 1 AND 1/2 TABLETS (37.5 MG TOTAL) BY MOUTH 2 (TWO) TIMES DAILY WITH A MEAL. 03/26/15   Patsey Berthold, NP  dofetilide (TIKOSYN) 500 MCG capsule Take 1 capsule (500 mcg total) by mouth 2 (two) times daily. 03/26/15   Amber Sena Slate, NP  glipiZIDE (GLUCOTROL XL) 5 MG 24 hr tablet Take 1 tablet (5 mg total) by mouth daily with breakfast. 12/03/14   Abner Greenspan, MD  glucose blood (ONE TOUCH ULTRA TEST) test strip CHECK GLUCOSE TWICE DAILY AND AS NEEDED, DX. E11.65 (UNCONTROLLED DM) 03/27/15   Abner Greenspan, MD  losartan (COZAAR) 50 MG tablet Take 1 tablet (50 mg total) by mouth daily. 03/26/15   Amber Sena Slate, NP  metFORMIN (GLUCOPHAGE) 500 MG tablet TAKE 1 TABLET BY MOUTH TWICE DAILY WITH A MEAL 08/11/15   Abner Greenspan, MD  Multiple Vitamin (MULTIVITAMIN) tablet Take 1 tablet by mouth daily.      Historical Provider, MD  Omeprazole-Sodium Bicarbonate (ZEGERID OTC) 20-1100 MG CAPS Take 1 capsule by mouth daily.     Historical Provider, MD  potassium chloride SA (KLOR-CON M20) 20 MEQ tablet Take 2 tablets (40 mEq  total) by mouth daily. 03/26/15   Amber Sena Slate, NP  rivaroxaban (XARELTO) 20 MG TABS tablet Take 1 tablet (20 mg total) by mouth daily with supper. 03/26/15   Amber Sena Slate, NP  spironolactone (ALDACTONE) 25 MG tablet Take 1 tablet (25 mg total) by mouth daily. 03/26/15   Amber Sena Slate, NP    Allergies: No Known Allergies  Social History   Social History  . Marital Status: Married    Spouse Name: [name]  . Number of Children: 2  . Years of Education: [name]   Occupational History  . PAINTER Unemployed   Social History Main Topics  . Smoking status: Never Smoker   . Smokeless tobacco: Former Systems developer  Types: Snuff     Comment: 02/14/2013 "quit snuff in 2006"  . Alcohol Use: No  . Drug Use: No  . Sexual Activity: Yes   Other Topics Concern  . Not on file   Social History Narrative   Lives in [place] with wife.  Works @ [employer] in [employer].  Not routinely exercising.     Family History  Problem Relation Age of Onset  . Hypertension Mother   . Diabetes Mother   . Hypertension Father   . Diabetes Father   . Diabetes Brother   . Kidney disease Brother   . Diabetes Brother   . Heart failure Mother     Died @ 46  . Heart failure Father     Died @ 72  . Other Sister     4 sisters A&W  . Heart attack Paternal Grandfather   . Stroke Paternal Grandmother   . Multiple myeloma Sister   . Leukemia Sister     Review of Systems: All other systems reviewed and are otherwise negative except as noted above.  Labs:   Lab Results  Component Value Date   WBC 10.7* 08/12/2015   HGB 15.0 08/12/2015   HCT 44.0 08/12/2015   MCV 86.3 08/12/2015   PLT 211 08/12/2015    Recent Labs Lab 08/12/15 1650  NA 137  K 3.6  CL 103  CO2 27  BUN 16  CREATININE 0.87  CALCIUM 8.9  GLUCOSE 174*   No results for input(s): CKTOTAL, CKMB, TROPONINI in the last 72 hours. Lab Results  Component Value Date   CHOL 149 04/07/2015   HDL 29.70* 04/07/2015   LDLCALC 94 04/07/2015    TRIG 129.0 04/07/2015   Lab Results  Component Value Date   DDIMER 0.33 08/18/2011    Radiology/Studies:  Dg Chest 2 View  07/29/2015  CLINICAL DATA:  Acute onset of palpitations.  Initial encounter. EXAM: CHEST  2 VIEW COMPARISON:  Chest radiograph performed 04/08/2015 FINDINGS: The lungs are well-aerated and clear. There is no evidence of focal opacification, pleural effusion or pneumothorax. The heart is normal in size; a pacemaker/AICD is noted at the left chest wall, with leads ending at the right atrium and right ventricle. No acute osseous abnormalities are seen. Cervical spinal fusion hardware is noted. External pacing pads are noted. IMPRESSION: No acute cardiopulmonary process seen. Electronically Signed   By: Garald Balding M.D.   On: 07/29/2015 06:59   Dg Chest Portable 1 View  08/12/2015  CLINICAL DATA:  Atrial fibrillation EXAM: PORTABLE CHEST 1 VIEW COMPARISON:  07/29/2015 FINDINGS: Biventricular pacer from the left with leads in stable position. No cardiomegaly. Stable aortic and hilar contours. There is no edema, consolidation, effusion, or pneumothorax. Artifact over the left chest from magnet over pacer battery pack. IMPRESSION: No evidence of active disease. Electronically Signed   By: Monte Fantasia M.D.   On: 08/12/2015 06:33   Wt Readings from Last 3 Encounters:  08/11/15 299 lb 8 oz (135.852 kg)  07/31/15 294 lb (133.358 kg)  07/29/15 296 lb (134.265 kg)    EKG: AV paced  Physical Exam: Blood pressure 146/95, pulse 79, temperature 98.6 F (37 C), temperature source Oral, resp. rate 18, SpO2 99 %. There is no weight on file to calculate BMI. General: Pleasant obese white male in no acute distress Head: Normocephalic, atraumatic, sclera non-icteric, no xanthomas, nares are without discharge.  Neck: Negative for carotid bruits. JVD not elevated. Lungs: Clear bilaterally to auscultation without wheezes,  rales, or rhonchi. Breathing is unlabored.  Defibrillator in place  in the left subclavian area Heart: RRR with S1 S2. No murmurs, rubs, or gallops appreciated. Abdomen: Soft, non-tender, non-distended with normoactive bowel sounds. No hepatomegaly. No rebound/guarding. No obvious abdominal masses. Msk:  Strength and tone appear normal for age. Extremities: No clubbing or cyanosis. No edema.  Distal pedal pulses are 2+ and equal bilaterally. Neuro: Alert and oriented X 3. No focal deficit. No facial asymmetry. Moves all extremities spontaneously. Psych:  Responds to questions appropriately with a normal affect.    Assessment and Plan   1. Recurrent wide complex tachycardia-Unclear whether this is ventricular tachycardia, pacemaker mediated tachycardia 2. Paroxysmal atrial fib with history of inappropriate defib shocks-Currently in normal sinus rhythm 3. NICM/chronic systolic CHF s/p CRT-D 4. Essential HTN 5. Borderline hypokalemia  See below comprehensive thoughts. Dr. Wynonia Lawman has reviewed the strips from the emergency room visit this morning and feels this represents recurrent AF RVR.  As the EKG was similar to an EKG from February.  Will admit, continue home regimen, and have EP evaluate in AM. Replete K and follow.  Signed, Charlie Pitter PA-C 08/12/2015, 7:59 PM Pager: [phone]  Patient seen and examined, chart reviewed, history exam and impressions above updated.  IMPRESSIONS:  1.  The patient has had recurrent paroxysmal atrial fibrillation and is having symptomatic wide complex arrhythmias.  It is unclear to me whether this represents a pacemaker mediated tachycardia or whether he is having episodes of ventricular tachycardia which she would be at risk for with his nonischemic cardiomyopathy.  He has had recurrence and also has had episodes of inappropriate shocks due to rapid atrial fibrillation of which is atrial fibrillation with fairly rapid this morning and I think he was able to avert an inappropriate shock by use of the magnet. 2.  Nonischemic  cardiomyopathy with history of minimal coronary disease previously but 4 years since really evaluated 3.  Obesity 4.  Untreated sleep apnea due to not being ab le to wear CPAP 5.  Obesity 6.  Hypokalemia  RECOMMENDATIONS:  He is given a be admitted overnight to telemetry.  He will need a full interrogation of his device in the morning by the factory representative as well as a comprehensive electrophysiology evaluation to evaluate the wide complex rhythm.  I would wonder whether he needs to have a repeat ischemic evaluation.  He will have his potassium repleted tonight and following replacement of potassium continue on Tikosyn.   Kerry Hough. MD [facility] 08/12/2015 8:22 PM

## 2015-08-12 NOTE — Discharge Instructions (Signed)

## 2015-08-12 NOTE — ED Provider Notes (Addendum)
CSN: [id number]     Arrival date & time 08/12/15  1643 History   First MD Initiated Contact with Patient 08/12/15 1846     Chief Complaint  Patient presents with  . Irregular Heart Beat     (Consider location/radiation/quality/duration/timing/severity/associated sxs/prior Treatment)  Patient with history of paroxysmal afib. He presents for evaluation of intermittent palpitations with associated shortness of breath and tightness in the chest.  He was seen this morning and had a cardioversion in the ED, and was sent home. He returns with a recurrence of his symptoms. There are no aggravating or alleviating factors.    The history is provided by the patient.    Past Medical History  Diagnosis Date  . Hypertension   . DJD (degenerative joint disease)   . Dermatophytosis of the body   . Esophageal reflux   . Other chronic nonalcoholic liver disease     fatty liver  . Unspecified hearing loss   . Anxiety state, unspecified   . Non-ischemic cardiomyopathy ([place])     a. cath 2013: minor nonobstructive CAD.  [name] PAF (paroxysmal atrial fibrillation) ([place])     a. s/p DCCV 6/11; previously on Pradaxa;  b. Event Monitor 2012->No PAF;  c. 09/2011 s/p DCCV ->Xarelto started. d. 03/2014: inappropriate ICD shocks for AF-RVR, started on Tikosyn, Xarelto restarted.  [name] LBBB (left bundle branch block)     intermittent  . Hx of colonic polyps   . Osteoarthrosis, unspecified whether generalized or localized, hand   . Chronic systolic CHF (congestive heart failure) ([place])   . OSA on CPAP   . Type II diabetes mellitus ([place])   . Calculus of kidney 1981    "passed it on my own"   Past Surgical History  Procedure Laterality Date  . Neck surgery      plate/fusion  . Bilateral knee arthroscopy    . Stress cardiolite  08/2005    Normal, EF 42%  . Upper gastrointestinal endoscopy  08/2006    GERD  . Abdominal US  01/2007    Fatty liver, no gallstones  . Doppler echocardiography  11/2009    Decreased EF  of 35-40%  . Appendectomy    . Cardioversion  09/29/2011    Procedure: CARDIOVERSION;  Surgeon: Thayer Headings, MD;  Location: [place] [facility];  Service: Cardiovascular;  Laterality: [name];  . Left heart catheterization with coronary angiogram [name] 08/18/2011    Procedure: LEFT HEART CATHETERIZATION WITH CORONARY ANGIOGRAM;  Surgeon: Peter M Martinique, MD;  Location: [facility] CATH LAB;  Service: Cardiovascular;  Laterality: [name];  . Bi-ventricular implantable cardioverter defibrillator [name] 02/14/2013    STJ CRTD implanted by Dr Caryl Comes   Family History  Problem Relation Age of Onset  . Hypertension Mother   . Diabetes Mother   . Hypertension Father   . Diabetes Father   . Diabetes Brother   . Kidney disease Brother   . Diabetes Brother   . Heart failure Mother     Died @ 75  . Heart failure Father     Died @ 79  . Other Sister     4 sisters A&W  . Heart attack Paternal Grandfather   . Stroke Paternal Grandmother   . Multiple myeloma Sister   . Leukemia Sister    Social History  Substance Use Topics  . Smoking status: Never Smoker   . Smokeless tobacco: Former Systems developer    Types: Snuff     Comment: 02/14/2013 "quit snuff in 2006"  . Alcohol  Use: No    Review of Systems  All other systems reviewed and are negative.     Allergies  Review of patient's allergies indicates no known allergies.  Home Medications   Prior to Admission medications   Medication Sig Start Date End Date Taking? Authorizing Provider  albuterol (PROVENTIL HFA;VENTOLIN HFA) 108 (90 Base) MCG/ACT inhaler Inhale 2 puffs into the lungs every 4 (four) hours as needed for wheezing or shortness of breath. 04/08/15  Yes Abner Greenspan, MD  benzonatate (TESSALON) 200 MG capsule Take 1 capsule (200 mg total) by mouth 3 (three) times daily as needed for cough (swallow whole do not bite pill). 06/30/15  Yes Abner Greenspan, MD  carvedilol (COREG) 25 MG tablet TAKE 1 AND 1/2 TABLETS (37.5 MG TOTAL) BY MOUTH 2 (TWO) TIMES DAILY WITH A MEAL. 03/26/15   Yes Amber Sena Slate, NP  dofetilide (TIKOSYN) 500 MCG capsule Take 1 capsule (500 mcg total) by mouth 2 (two) times daily. 03/26/15  Yes Amber Sena Slate, NP  glipiZIDE (GLUCOTROL XL) 5 MG 24 hr tablet Take 1 tablet (5 mg total) by mouth daily with breakfast. 12/03/14  Yes Abner Greenspan, MD  losartan (COZAAR) 50 MG tablet Take 1 tablet (50 mg total) by mouth daily. 03/26/15  Yes Amber Sena Slate, NP  metFORMIN (GLUCOPHAGE) 500 MG tablet TAKE 1 TABLET BY MOUTH TWICE DAILY WITH A MEAL 08/11/15  Yes Abner Greenspan, MD  Multiple Vitamin (MULTIVITAMIN) tablet Take 1 tablet by mouth daily.     Yes Historical Provider, MD  Omeprazole-Sodium Bicarbonate (ZEGERID OTC) 20-1100 MG CAPS Take 1 capsule by mouth daily.    Yes Historical Provider, MD  potassium chloride SA (KLOR-CON M20) 20 MEQ tablet Take 2 tablets (40 mEq total) by mouth daily. 03/26/15  Yes Amber Sena Slate, NP  rivaroxaban (XARELTO) 20 MG TABS tablet Take 1 tablet (20 mg total) by mouth daily with supper. 03/26/15  Yes Amber Sena Slate, NP  spironolactone (ALDACTONE) 25 MG tablet Take 1 tablet (25 mg total) by mouth daily. 03/26/15  Yes Amber Sena Slate, NP  glucose blood (ONE TOUCH ULTRA TEST) test strip CHECK GLUCOSE TWICE DAILY AND AS NEEDED, DX. E11.65 (UNCONTROLLED DM) 03/27/15   Wynelle Fanny Tower, MD   BP 155/92 mmHg  Pulse 72  Temp(Src) 98.6 F (37 C) (Oral)  Resp 19  SpO2 100% Physical Exam  Constitutional: He is oriented to person, place, and time. He appears well-developed and well-nourished. No distress.  HENT:  Head: Normocephalic and atraumatic.  Mouth/Throat: Oropharynx is clear and moist.  Neck: Normal range of motion. Neck supple.  Cardiovascular: Exam reveals no friction rub.   No murmur heard. Heart is irregularly irregular and rapid.  Pulmonary/Chest: Effort normal and breath sounds normal. No respiratory distress. He has no wheezes. He has no rales.  Abdominal: Soft. Bowel sounds are normal. He exhibits no distension. There is no tenderness.   Musculoskeletal: Normal range of motion. He exhibits no edema.  Neurological: He is alert and oriented to person, place, and time. Coordination normal.  Skin: Skin is warm and dry. He is not diaphoretic.  Nursing note and vitals reviewed.   ED Course  Procedures (including critical care time) Labs Review Labs Reviewed  BASIC METABOLIC PANEL - Abnormal; Notable for the following:    Glucose, Bld 174 (*)    All other components within normal limits  CBC - Abnormal; Notable for the following:    WBC 10.7 (*)  All other components within normal limits  I-STAT TROPOININ, ED    Imaging Review Dg Chest Portable 1 View  08/12/2015  CLINICAL DATA:  Atrial fibrillation EXAM: PORTABLE CHEST 1 VIEW COMPARISON:  07/29/2015 FINDINGS: Biventricular pacer from the left with leads in stable position. No cardiomegaly. Stable aortic and hilar contours. There is no edema, consolidation, effusion, or pneumothorax. Artifact over the left chest from magnet over pacer battery pack. IMPRESSION: No evidence of active disease. Electronically Signed   By: Monte Fantasia M.D.   On: 08/12/2015 06:33   I have personally reviewed and evaluated these images and lab results as part of my medical decision-making.   EKG Interpretation   Date/Time:  Tuesday August 12 2015 16:49:42 EDT Ventricular Rate:  85 PR Interval:  152 QRS Duration: 134 QT Interval:  434 QTC Calculation: 516 R Axis:   -3 Text Interpretation:  Sinus rhythm Premature ventricular and fusion  complexes Abnormal ECG Confirmed by Chadley Dziedzic  MD, Levante Simones ([id number]) on 08/12/2015  6:47:03 PM      MDM   Final diagnoses:  None    Patient presents with palpitations that cause him shortness of breath and chest tightness. This is been occurring intermittently throughout the day. He was seen earlier this morning for atrial fibrillation and had a cardioversion done. His workup today reveals him to be mostly in a sinus rhythm, however he has had multiple  episodes of some form of regular, wide complex tachycardia. This seems to coincide with his symptoms. I have discussed this with Dr. Debara Pickett from cardiology who will evaluate the patient in the ER. His laboratory studies are otherwise unremarkable and he has no other complaints.    Veryl Speak, MD 08/12/15 3225  Veryl Speak, MD 09/11/15 1003

## 2015-08-12 NOTE — ED Notes (Signed)
NAD at this time. Pt is stable and going home.  

## 2015-08-12 NOTE — ED Provider Notes (Signed)
Pt is stable.  NSR.  Wants to go home.  Will D/C  Nat Christen, MD 08/12/15 [phone]

## 2015-08-13 ENCOUNTER — Encounter (HOSPITAL_COMMUNITY): Payer: Self-pay | Admitting: *Deleted

## 2015-08-13 DIAGNOSIS — I4891 Unspecified atrial fibrillation: Secondary | ICD-10-CM

## 2015-08-13 LAB — CBC
HCT: 42.4 % (ref 39.0–52.0)
Hemoglobin: 14 g/dL (ref 13.0–17.0)
MCH: 28.7 pg (ref 26.0–34.0)
MCHC: 33 g/dL (ref 30.0–36.0)
MCV: 86.9 fL (ref 78.0–100.0)
Platelets: 155 10*3/uL (ref 150–400)
RBC: 4.88 MIL/uL (ref 4.22–5.81)
RDW: 13.6 % (ref 11.5–15.5)
WBC: 8.7 10*3/uL (ref 4.0–10.5)

## 2015-08-13 LAB — MAGNESIUM: Magnesium: 2 mg/dL (ref 1.7–2.4)

## 2015-08-13 LAB — BASIC METABOLIC PANEL
ANION GAP: 8 (ref 5–15)
BUN: 14 mg/dL (ref 6–20)
CHLORIDE: 104 mmol/L (ref 101–111)
CO2: 27 mmol/L (ref 22–32)
Calcium: 8.5 mg/dL — ABNORMAL LOW (ref 8.9–10.3)
Creatinine, Ser: 0.78 mg/dL (ref 0.61–1.24)
GFR calc Af Amer: >60 mL/min (ref >60)
GFR calc non Af Amer: >60 mL/min (ref >60)
Glucose, Bld: 133 mg/dL — ABNORMAL HIGH (ref 65–99)
POTASSIUM: 4 mmol/L (ref 3.5–5.1)
SODIUM: 139 mmol/L (ref 135–145)

## 2015-08-13 LAB — TROPONIN I: Troponin I: <0.03 ng/mL (ref <0.03)

## 2015-08-13 LAB — MRSA PCR SCREENING: MRSA BY PCR: NEGATIVE

## 2015-08-13 NOTE — Consult Note (Signed)
ELECTROPHYSIOLOGY CONSULT NOTE    Patient ID: Aaron Thompson MRN: [id number], DOB/AGE: 1960/05/03 55 y.o.  Admit date: 08/12/2015 Date of Consult: 08/13/2015  Primary Physician: [name], MD Primary Cardiologist: Dr. [name] Electrophysiologist: Dr. Caryl Comes Requesting MD: Dr. Wynonia Lawman  Reason for Consultation: WCT  HPI: Aaron Thompson is a 55 y.o. male hypertension, non-ischemic cardiomyopathy s/p St. Jude CRT-D (minor CAD by cath 2013, EF 15-20%), paroxysmal atrial fibrillation (h/o inappropriate ICD shocks for this, on Tikosyn), chronic systolic heart failure, sleep apnea (on CPAP), diabetes, obesity, intermittent LBBB, NSVT who presented to [facility] with recurrent chest pressure/SOB c/w his prior arrhythmias.  The patient was last seen by EP service Tommye Standard, PA-C) earlier this month for f/u after having DCCV in the ED 07/29/15.  AT that time he was doing well without recurrent AF.  He returned yesterday with palpitations and chest discomfort that he knows to be his arrhythmia symptom, noted to be in Rapid AF and cardioverted.   LABS: K+ 4.0 Mag 2.0 BUN/Creat 14/0.78 Trop I: <0.03 x3 H/H 14/42 WBC 8.7  AFib Hx: ER visits with RVR requiring DCCV Feb and March July 2017 x2, previous DCCV 2011, 2013 Rapid Afib with inapproriate shocks April 2016, Feb 2017 Tikosyn Xarelto Genetic AF trial visit 06/18/15, screen fail, negative for gene  Past Medical History  Diagnosis Date  . Hypertension   . DJD (degenerative joint disease)   . Dermatophytosis of the body   . Esophageal reflux   . Other chronic nonalcoholic liver disease     fatty liver  . Unspecified hearing loss   . Anxiety state, unspecified   . Non-ischemic cardiomyopathy ([place])     a. cath 2013: minor nonobstructive CAD.  [name] PAF (paroxysmal atrial fibrillation) ([place])     a. s/p DCCV 6/11; previously on Pradaxa;  b. Event Monitor 2012->No PAF;  c. 09/2011 s/p DCCV ->Xarelto started. d. 03/2014: inappropriate ICD shocks for  AF-RVR, started on Tikosyn, Xarelto restarted.  [name] LBBB (left bundle branch block)     intermittent  . Hx of colonic polyps   . Osteoarthrosis, unspecified whether generalized or localized, hand   . Chronic systolic CHF (congestive heart failure) ([place])   . OSA on CPAP   . Type II diabetes mellitus ([place])   . Calculus of kidney 1981    "passed it on my own"     Surgical History:  Past Surgical History  Procedure Laterality Date  . Neck surgery      plate/fusion  . Bilateral knee arthroscopy    . Stress cardiolite  08/2005    Normal, EF 42%  . Upper gastrointestinal endoscopy  08/2006    GERD  . Abdominal US  01/2007    Fatty liver, no gallstones  . Doppler echocardiography  11/2009    Decreased EF of 35-40%  . Appendectomy    . Cardioversion  09/29/2011    Procedure: CARDIOVERSION;  Surgeon: Thayer Headings, MD;  Location: [facility];  Service: Cardiovascular;  Laterality: [name];  . Left heart catheterization with coronary angiogram [name] 08/18/2011    Procedure: LEFT HEART CATHETERIZATION WITH CORONARY ANGIOGRAM;  Surgeon: Peter M Martinique, MD;  Location: [facility] CATH LAB;  Service: Cardiovascular;  Laterality: [name];  . Bi-ventricular implantable cardioverter defibrillator [name] 02/14/2013    STJ CRTD implanted by Dr Caryl Comes     Prescriptions prior to admission  Medication Sig Dispense Refill Last Dose  . albuterol (PROVENTIL HFA;VENTOLIN HFA) 108 (90 Base) MCG/ACT inhaler Inhale 2 puffs  into the lungs every 4 (four) hours as needed for wheezing or shortness of breath. 1 Inhaler 1 Past Month at Unknown time  . benzonatate (TESSALON) 200 MG capsule Take 1 capsule (200 mg total) by mouth 3 (three) times daily as needed for cough (swallow whole do not bite pill). 30 capsule 1 Past Month at Unknown time  . carvedilol (COREG) 25 MG tablet TAKE 1 AND 1/2 TABLETS (37.5 MG TOTAL) BY MOUTH 2 (TWO) TIMES DAILY WITH A MEAL. 270 tablet 1 08/12/2015 at 0800  . dofetilide (TIKOSYN) 500 MCG capsule Take 1 capsule (500  mcg total) by mouth 2 (two) times daily. 60 capsule 6 08/12/2015 at Unknown time  . glipiZIDE (GLUCOTROL XL) 5 MG 24 hr tablet Take 1 tablet (5 mg total) by mouth daily with breakfast. 30 tablet 11 08/12/2015 at Unknown time  . losartan (COZAAR) 50 MG tablet Take 1 tablet (50 mg total) by mouth daily. 30 tablet 5 08/12/2015 at Unknown time  . metFORMIN (GLUCOPHAGE) 500 MG tablet TAKE 1 TABLET BY MOUTH TWICE DAILY WITH A MEAL 60 tablet 11 08/11/2015 at Unknown time  . Multiple Vitamin (MULTIVITAMIN) tablet Take 1 tablet by mouth daily.     08/11/2015 at Unknown time  . Omeprazole-Sodium Bicarbonate (ZEGERID OTC) 20-1100 MG CAPS Take 1 capsule by mouth daily.    08/12/2015 at Unknown time  . potassium chloride SA (KLOR-CON M20) 20 MEQ tablet Take 2 tablets (40 mEq total) by mouth daily. 60 tablet 11 08/12/2015 at Unknown time  . rivaroxaban (XARELTO) 20 MG TABS tablet Take 1 tablet (20 mg total) by mouth daily with supper. 30 tablet 2 08/11/2015 at 2000  . spironolactone (ALDACTONE) 25 MG tablet Take 1 tablet (25 mg total) by mouth daily. 30 tablet 8 08/12/2015 at Unknown time  . glucose blood (ONE TOUCH ULTRA TEST) test strip CHECK GLUCOSE TWICE DAILY AND AS NEEDED, DX. E11.65 (UNCONTROLLED DM) 100 each 5 Taking    Inpatient Medications:  . carvedilol  37.5 mg Oral BID WC  . dofetilide  500 mcg Oral BID  . glipiZIDE  5 mg Oral Q breakfast  . losartan  50 mg Oral Daily  . multivitamin with minerals  1 tablet Oral Daily  . pantoprazole  40 mg Oral Daily  . potassium chloride SA  40 mEq Oral Daily  . rivaroxaban  20 mg Oral Q supper  . sodium chloride flush  3 mL Intravenous Q12H  . spironolactone  25 mg Oral Daily    Allergies: No Known Allergies  Social History   Social History  . Marital Status: Married    Spouse Name: [name]  . Number of Children: 2  . Years of Education: [name]   Occupational History  . PAINTER Unemployed   Social History Main Topics  . Smoking status: Never Smoker   .  Smokeless tobacco: Former Systems developer    Types: Snuff     Comment: 02/14/2013 "quit snuff in 2006"  . Alcohol Use: No  . Drug Use: No  . Sexual Activity: Yes   Other Topics Concern  . Not on file   Social History Narrative   Lives in [place] with wife.  Works @ [employer] in [employer].  Not routinely exercising.     Family History  Problem Relation Age of Onset  . Hypertension Mother   . Diabetes Mother   . Hypertension Father   . Diabetes Father   . Diabetes Brother   . Kidney disease Brother   .  Diabetes Brother   . Heart failure Mother     Died @ 19  . Heart failure Father     Died @ 69  . Other Sister     4 sisters A&W  . Heart attack Paternal Grandfather   . Stroke Paternal Grandmother   . Multiple myeloma Sister   . Leukemia Sister      Review of Systems: All other systems reviewed and are otherwise negative except as noted above.  Physical Exam: Filed Vitals:   08/13/15 0713 08/13/15 0800 08/13/15 1000 08/13/15 1202  BP: 134/87 129/95 141/85 128/84  Pulse: 79 78 99 81  Temp: 98.2 F (36.8 C)   98.3 F (36.8 C)  TempSrc: Oral   Oral  Resp: '16 19 18 16  ' Height:      Weight:      SpO2: 98% 97% 96% 96%    GEN- The patient is well appearing, alert and oriented x 3 today.   HEENT: normocephalic, atraumatic; sclera clear, conjunctiva pink; hearing intact; oropharynx clear; neck supple, no JVP Lymph- no cervical lymphadenopathy Lungs- Clear to ausculation bilaterally, normal work of breathing.  No wheezes, rales, rhonchi Heart- Regular rate and rhythm, no murmurs, rubs or gallops, PMI not laterally displaced GI- soft, non-tender, non-distended, bowel sounds present Extremities- no clubbing, cyanosis, or edema; DP/PT/radial pulses 2+ bilaterally MS- no significant deformity or atrophy Skin- warm and dry, no rash or lesion Psych- euthymic mood, full affect Neuro- no gross deficits observed  Labs:   Lab Results  Component Value Date   WBC 8.7  08/13/2015   HGB 14.0 08/13/2015   HCT 42.4 08/13/2015   MCV 86.9 08/13/2015   PLT 155 08/13/2015    Recent Labs Lab 08/13/15 0302  NA 139  K 4.0  CL 104  CO2 27  BUN 14  CREATININE 0.78  CALCIUM 8.5*  GLUCOSE 133*      Radiology/Studies:   Dg Chest Portable 1 View 08/12/2015  CLINICAL DATA:  Atrial fibrillation EXAM: PORTABLE CHEST 1 VIEW COMPARISON:  07/29/2015 FINDINGS: Biventricular pacer from the left with leads in stable position. No cardiomegaly. Stable aortic and hilar contours. There is no edema, consolidation, effusion, or pneumothorax. Artifact over the left chest from magnet over pacer battery pack. IMPRESSION: No evidence of active disease. Electronically Signed   By: Monte Fantasia M.D.   On: 08/12/2015 06:33    EKG: WCT, slightly irregular  >> SR, V paced, QTC with paced QRS 174m stable TELEMETRY: SR, he has episodes of tachycardia that are paced, and likely tracking Arate ICD interrogation: notes AFib/Atach episodes, no ventricular episodes, device functioning normally, battery status is OK  04/19/14: Echocardiogram Study Conclusions - Left ventricle: LVEF is severely depressed at approximately 15 to 20% with diffuse hypokinesis. The cavity size was severely dilated. Wall thickness was normal. Doppler parameters are consistent with abnormal left ventricular relaxation (grade 1 diastolic dysfunction). - Mitral valve: There was mild regurgitation. - Left atrium: The atrium was mildly dilated. 410m- Right ventricle: The cavity size was mildly dilated. Systolic function was moderately to severely reduced.   09/22/11; CPX Conclusion: Exercise testing with gas exchange demonstrated a normal functional capacity when compared to matched sedentary norms. There is no evidence of a cardiovascular limitation to the exercise. Based on his absolute oxygen uptake, the patient is actually in quite good cardiovascular shape however his actual function  capacity is markedly limited by his obesity and related ventilatory limitation.  DEVICE HISTORY:  STJ CRTD implanted  2015 for NICM History of appropriate therapy: No - inappropriate therapy for AF with RVR History of AAD therapy: Yes - Tikosyn for AF  Assessment and Plan:   1. Palpitations/CP, symptoms are c/w his previous PAF RVR episodes, he is symptom free in SR    PAFib with RVR    Neg Trop x3, (minor CAD by cath in 2013)    Numerous DCCV and a number of inappropriate ICD shocks for this    CHA2DS2Vasc is at least 3 on Xarelto    On Tikosyn, labs/QTc stable    Will await Dr. Lovena Le given recurrent AF for further options, prior EP notes have mentioned felt given weight, not likely an ablation candidate    His coreg is at 37.72m BID   2. NICM     Exam is compensated  3. HTN     Stable  4. ? Suspect sleep apnea     He is pending sleep study eval in October, was the first available he could get  SVenetia Night PA-C 08/13/2015 12:44 PM  EP Attending  Patient seen and examined. Agree with above. On exam he is a large man with a IRIR rhythm, clear lungs and minimal peripheral edema. Neuro is non-focal.  A/P 1. Recurrent rapid palpitations - he is back to NSR. He had PAF previously with a RVR. At this point, he will continue Tikosyn, will attempt to uptitrate his beta blocker. 2. PAF - I discussed consideration for either atrial fib ablation or AV node ablation. He would like to discuss with Dr. SRenaldo Reel 3. HTN -his blood pressure is stable on medical therapy.  GMikle BosworthD.

## 2015-08-13 NOTE — Progress Notes (Signed)
Inpatient Diabetes Program Recommendations  AACE/ADA: New Consensus Statement on Inpatient Glycemic Control (2015)  Target Ranges:  Prepandial:   less than 140 mg/dL      Peak postprandial:   less than 180 mg/dL (1-2 hours)      Critically ill patients:  140 - 180 mg/dL   Lab Results  Component Value Date   GLUCAP 229* 04/22/2014   HGBA1C 7.1* 08/08/2015    Review of Glycemic Control  Diabetes history: DM2 Outpatient Diabetes medications: Glucotrol XL 5 mg daily and metformin 500 mg bid Current orders for Inpatient glycemic control: Glipizide XL 5 mg daily  Inpatient Diabetes Program Recommendations:    Added Carbohydrate modified to present diet order with cosign required. If patient remains in the hospital, please consider using the sensitive correction insulin tidwc. If patient is given glipizide XL and if unable to eat at any time, hypoglycemia may be a potential.  Thank you Rosita Kea, RN, MSN, CDE  Diabetes Inpatient Program Office: [phone] Pager: [phone] 8:00 am to 5:00 pm

## 2015-08-13 NOTE — Plan of Care (Signed)
Problem: Safety: Goal: Ability to remain free from injury will improve Outcome: Progressing Discussed ring for assistance to the bathroom.

## 2015-08-14 ENCOUNTER — Inpatient Hospital Stay (HOSPITAL_COMMUNITY)
Admission: RE | Admit: 2015-08-14 | Payer: BLUE CROSS/BLUE SHIELD | Source: Ambulatory Visit | Admitting: Nurse Practitioner

## 2015-08-14 DIAGNOSIS — I429 Cardiomyopathy, unspecified: Secondary | ICD-10-CM | POA: Diagnosis not present

## 2015-08-14 DIAGNOSIS — I4891 Unspecified atrial fibrillation: Secondary | ICD-10-CM | POA: Diagnosis not present

## 2015-08-14 LAB — MAGNESIUM: Magnesium: 2.1 mg/dL (ref 1.7–2.4)

## 2015-08-14 LAB — BASIC METABOLIC PANEL
ANION GAP: 7 (ref 5–15)
BUN: 15 mg/dL (ref 6–20)
CALCIUM: 9.2 mg/dL (ref 8.9–10.3)
CHLORIDE: 103 mmol/L (ref 101–111)
CO2: 26 mmol/L (ref 22–32)
Creatinine, Ser: 0.84 mg/dL (ref 0.61–1.24)
GFR calc non Af Amer: >60 mL/min (ref >60)
Glucose, Bld: 156 mg/dL — ABNORMAL HIGH (ref 65–99)
Potassium: 3.6 mmol/L (ref 3.5–5.1)
Sodium: 136 mmol/L (ref 135–145)

## 2015-08-14 LAB — GLUCOSE, CAPILLARY
GLUCOSE-CAPILLARY: 267 mg/dL — AB (ref 65–99)
GLUCOSE-CAPILLARY: 193 mg/dL — AB (ref 65–99)

## 2015-08-14 MED ORDER — RANOLAZINE ER 500 MG PO TB12
500.0000 mg | ORAL_TABLET | Freq: Two times a day (BID) | ORAL | Status: DC
  Administered 2015-08-14 – 2015-08-15 (×3): 500 mg via ORAL
  Filled 2015-08-14 (×3): qty 1

## 2015-08-14 MED ORDER — POTASSIUM CHLORIDE CRYS ER 20 MEQ PO TBCR
20.0000 meq | EXTENDED_RELEASE_TABLET | Freq: Once | ORAL | Status: AC
  Administered 2015-08-14: 20 meq via ORAL
  Filled 2015-08-14: qty 1

## 2015-08-14 NOTE — Progress Notes (Signed)
SUBJECTIVE: The patient is doing well today.  At this time, he denies chest pain, shortness of breath, or any new concerns.  Noted his HR on the monitor about 100-108 though no perceived palpitations/arrhythmia.  . carvedilol  37.5 mg Oral BID WC  . dofetilide  500 mcg Oral BID  . glipiZIDE  5 mg Oral Q breakfast  . losartan  50 mg Oral Daily  . multivitamin with minerals  1 tablet Oral Daily  . pantoprazole  40 mg Oral Daily  . potassium chloride SA  40 mEq Oral Daily  . ranolazine  500 mg Oral BID  . rivaroxaban  20 mg Oral Q supper  . sodium chloride flush  3 mL Intravenous Q12H  . spironolactone  25 mg Oral Daily      OBJECTIVE: Physical Exam: Filed Vitals:   08/14/15 0400 08/14/15 0405 08/14/15 0406 08/14/15 0800  BP:   124/81 126/89  Pulse:   92 94  Temp: 98.1 F (36.7 C)  98.8 F (37.1 C) 97.6 F (36.4 C)  TempSrc: Oral  Oral   Resp:   20 16  Height:      Weight:  284 lb 6.3 oz (129 kg)    SpO2:   100% 97%    Intake/Output Summary (Last 24 hours) at 08/14/15 1055 Last data filed at 08/14/15 0444  Gross per 24 hour  Intake    720 ml  Output   2700 ml  Net  -1980 ml    Telemetry reveals SR, V paced, occ PVC, couplets  GEN- The patient is well appearing, alert and oriented x 3 today.   Head- normocephalic, atraumatic Eyes-  Sclera clear, conjunctiva pink Ears- hearing intact Oropharynx- clear Neck- supple, no JVP Lungs- Clear to ausculation bilaterally, normal work of breathing Heart- Regular rate and rhythm, no significant murmurs, no rubs or gallops GI- soft, NT, ND Extremities- no clubbing, cyanosis, or edema Skin- no rash or lesion Psych- euthymic mood, full affect Neuro- no gross deficits appreciated  LABS: Basic Metabolic Panel:  Recent Labs  08/13/15 0302 08/14/15 0221  NA 139 136  K 4.0 3.6  CL 104 103  CO2 27 26  GLUCOSE 133* 156*  BUN 14 15  CREATININE 0.78 0.84  CALCIUM 8.5* 9.2  MG 2.0 2.1   CBC:  Recent Labs  08/12/15 0550  08/12/15 1650 08/13/15 0302  WBC 10.1  --  10.7* 8.7  NEUTROABS 6.1  --   --   --   HGB 15.8  < > 15.0 14.0  HCT 47.1  < > 44.0 42.4  MCV 86.7  --  86.3 86.9  PLT 249  --  211 155  < > = values in this interval not displayed. Cardiac Enzymes:  Recent Labs  08/12/15 2235 08/13/15 0302 08/13/15 1010  TROPONINI <0.03 <0.03 <0.03    AFib Hx: ER visits with RVR requiring DCCV Feb and March July 2017 x2, previous DCCV 2011, 2013 Rapid Afib with inapproriate shocks April 2016, Feb 2017 Tikosyn Xarelto Genetic AF trial visit 06/18/15, screen fail, negative for gene   ASSESSMENT AND PLAN:   1. Palpitations/CP, symptoms are c/w his previous PAF RVR episodes, he is symptom free in SR  PAFib with RVR  Neg Trop x3, (minor CAD by cath in 2013)  Numerous DCCV and a number of inappropriate ICD shocks for this  CHA2DS2Vasc is at least 3 on Xarelto  On Tikosyn, labs/QTc stable  His coreg is at 37.5mg  BID  Will add Ranolazine, continue to monitor tele    Will refer him to Dr. Rayann Heman to discuss ablation  2. NICM  Exam is compensated  3. HTN  Stable  4. Suspect sleep apnea  He is pending sleep study eval in October, was the first available he could get    Today's RN mentioned his dose of Tikosyn held last evening for QTc 520 (at the direction of PM covering MD), Dr. Caryl Comes discussed protocol given he has an ICD is to hold only if QTc is >550, (noting as well his QRS is BiVe paced 120-165ms).  He did get his dose this AM.  Tommye Standard, PA-C 08/14/2015 10:55 AM  Patient seen and examined with the note comprised above in tandem

## 2015-08-14 NOTE — Progress Notes (Signed)
Inpatient Diabetes Program Recommendations  AACE/ADA: New Consensus Statement on Inpatient Glycemic Control (2015)  Target Ranges:  Prepandial:   less than 140 mg/dL      Peak postprandial:   less than 180 mg/dL (1-2 hours)      Critically ill patients:  140 - 180 mg/dL  Results for Aaron Thompson, Aaron Thompson (MRN [id number]) as of 08/14/2015 12:00  Ref. Range 08/14/2015 08:00  Glucose-Capillary Latest Ref Range: 65-99 mg/dL 267 (H)   Results for Aaron Thompson, Aaron Thompson (MRN [id number]) as of 08/14/2015 12:00  Ref. Range 08/08/2015 08:21  Hemoglobin A1C Latest Ref Range: 4.6-6.5 % 7.1 (H)    Review of Glycemic Control  Diabetes history: DM2 Outpatient Diabetes medications: Glipizide XL 5 mg QAM, Metformin 500 mg BID Current orders for Inpatient glycemic control: Glipizide XL 5 mg QAM  Inpatient Diabetes Program Recommendations: Correction (SSI): While inpatient, please consider ordering CBGs with Novolog correction scale ACHS. HgbA1C: A1C 7.1% on 08/08/15 indicating good glycemic control over the past 2-3 months.  Thanks, Barnie Alderman, RN, MSN, CDE Diabetes Coordinator Inpatient Diabetes Program [phone] (Team Pager from [place] to [place]) [phone] ([place] office) [phone] [facility] office) [phone] [facility] office)

## 2015-08-15 ENCOUNTER — Telehealth: Payer: Self-pay | Admitting: Internal Medicine

## 2015-08-15 ENCOUNTER — Telehealth: Payer: Self-pay

## 2015-08-15 ENCOUNTER — Other Ambulatory Visit: Payer: Self-pay | Admitting: Physician Assistant

## 2015-08-15 LAB — BASIC METABOLIC PANEL
Anion gap: 8 (ref 5–15)
BUN: 15 mg/dL (ref 6–20)
CHLORIDE: 101 mmol/L (ref 101–111)
CO2: 26 mmol/L (ref 22–32)
CREATININE: 0.91 mg/dL (ref 0.61–1.24)
Calcium: 8.9 mg/dL (ref 8.9–10.3)
GFR calc non Af Amer: >60 mL/min (ref >60)
Glucose, Bld: 189 mg/dL — ABNORMAL HIGH (ref 65–99)
Potassium: 3.8 mmol/L (ref 3.5–5.1)
Sodium: 135 mmol/L (ref 135–145)

## 2015-08-15 LAB — MAGNESIUM: Magnesium: 2 mg/dL (ref 1.7–2.4)

## 2015-08-15 MED ORDER — POTASSIUM CHLORIDE CRYS ER 20 MEQ PO TBCR
20.0000 meq | EXTENDED_RELEASE_TABLET | Freq: Once | ORAL | Status: AC
  Administered 2015-08-15: 20 meq via ORAL
  Filled 2015-08-15: qty 1

## 2015-08-15 MED ORDER — POTASSIUM CHLORIDE ER 20 MEQ PO TBCR: 10.0000 meq | EXTENDED_RELEASE_TABLET | Freq: Every day | ORAL | Status: DC

## 2015-08-15 MED ORDER — POTASSIUM CHLORIDE CRYS ER 20 MEQ PO TBCR: 40.0000 meq | EXTENDED_RELEASE_TABLET | Freq: Every day | ORAL | Status: DC

## 2015-08-15 MED ORDER — RANOLAZINE ER 500 MG PO TB12: 500.0000 mg | ORAL_TABLET | Freq: Two times a day (BID) | ORAL | Status: DC

## 2015-08-15 MED ORDER — CARVEDILOL 25 MG PO TABS: 50.0000 mg | ORAL_TABLET | Freq: Two times a day (BID) | ORAL | Status: DC

## 2015-08-15 NOTE — Telephone Encounter (Signed)
New Message   pt call requesting to speak with Rn about getting and authorization form to his insurance company for his medication. Pt states he is not able to get meds with out auth. Form. Please call back to discuss

## 2015-08-15 NOTE — Progress Notes (Signed)
RN called central monitoring and asked for QTc measurement before administering Tikosyn. Reported to be 0.49.

## 2015-08-15 NOTE — Telephone Encounter (Signed)
Called patient about his medication. Patient was started on Ranexa 500 mg by mouth twice daily. Patient is having trouble with insurance covering medication. Patient bought enough pills to get him through Tuesday. Will send message to Yoe, Vaughan Basta. There were samples at [facility] office. Left sample for patient at front desk to pick-up on Monday. Patient verbalized understanding.

## 2015-08-15 NOTE — Discharge Summary (Signed)
DISCHARGE SUMMARY    Patient ID: Aaron Thompson,  MRN: [id number], DOB/AGE: 54-21-1962 54 y.o.  Admit date: 08/12/2015 Discharge date: 08/15/15   Primary Care Physician: [name], MD Primary Cardiologist: Dr. [name] Electrophysiologist: Dr. Caryl Comes  Primary Discharge Diagnosis:  1. PAFib, RVR, symptomatic     CHA2DS2Vasc is at least 3, on Xarelto  Secondary Discharge Diagnosis:  1. NICM 2. Obesity  No Known Allergies   Procedures This Admission:  None  Brief HPI: Aaron Thompson is a 55 y.o. male was admitted to [facility] 08/12/15 with recurrent palpitations, noting he had just been discharged from the ED earlier that same day with AFib RVR, and had undergone DCCV to SR.  Since he was back in AFib with RVR so quickly he was admitted for further management.  Hospital Course:  The patient was c/o recurrent chest pressure/SOB c/w his prior arrhythmias, stated he knows this to be his arrhythmia symptom, and is completely asymptomatic when in SR, with no c/o CP or SOB.  The patient had has a number of inappropriate shocks in the past for rapid AFib as well as numerous DCCV.   The patient was monitored on telemetry with brief episodes of tachy rates, paced generally, at times with intrinsic conduction/WCT, interrogation of his device showed no VT, only PAFib/PAT episodes.  Ranolazine was added to his regime, with reduction in the frequency of these brief tachycardic episodes.  The ICD was reinterrogated the day of discharge and reprogrammed to eliminate PMT that was observed on telemetry.  He is continued on his Tikosyn, QTc stable,  He has required K+ replacement and will discharge him home on increased dose.  We will increase his coreg to 50mg  BID.  Dr. Caryl Comes in discussion with the patient, suggests evaluation for AFib/Atach ablation and out patient consult with Dr. Rayann Heman has been arranged.  Early follow up is also arranged.   The patient was seen/examined by Dr. Caryl Comes and felt stable  for discharge to home.    Physical Exam: Filed Vitals:   08/14/15 2310 08/15/15 0336 08/15/15 0905 08/15/15 1247  BP: 106/77 122/82 107/86 109/80  Pulse:   84 87  Temp: 98.5 F (36.9 C) 98 F (36.7 C) 97.5 F (36.4 C) 98.3 F (36.8 C)  TempSrc: Oral Oral Oral Oral  Resp:   17 16  Height:      Weight:  282 lb 10.1 oz (128.2 kg)    SpO2:   95% 94%    GEN- The patient is well appearing, alert and oriented x 3 today.   HEENT: normocephalic, atraumatic; sclera clear, conjunctiva pink; hearing intact; oropharynx clear; neck supple, no JVP Lungs- Clear to ausculation bilaterally, normal work of breathing.  No wheezes, rales, rhonchi Heart- Regular rate and rhythm, no murmurs, rubs or gallops, PMI not laterally displaced GI- soft, non-tender, non-distended Extremities- no clubbing, cyanosis, or edema Skin- warm and dry, no rash or lesion, left chest without hematoma/ecchymosis Psych- euthymic mood, full affect Neuro- no gross deficits   Labs:   Lab Results  Component Value Date   WBC 8.7 08/13/2015   HGB 14.0 08/13/2015   HCT 42.4 08/13/2015   MCV 86.9 08/13/2015   PLT 155 08/13/2015     Recent Labs Lab 08/15/15 0529  NA 135  K 3.8  CL 101  CO2 26  BUN 15  CREATININE 0.91  CALCIUM 8.9  GLUCOSE 189*    Discharge Medications:    Medication List  TAKE these medications        albuterol 108 (90 Base) MCG/ACT inhaler  Commonly known as:  PROVENTIL HFA;VENTOLIN HFA  Inhale 2 puffs into the lungs every 4 (four) hours as needed for wheezing or shortness of breath.     benzonatate 200 MG capsule  Commonly known as:  TESSALON  Take 1 capsule (200 mg total) by mouth 3 (three) times daily as needed for cough (swallow whole do not bite pill).     carvedilol 25 MG tablet  Commonly known as:  COREG  Take 2 tablets (50 mg total) by mouth 2 (two) times daily with a meal. TAKE 1 AND 1/2 TABLETS (37.5 MG TOTAL) BY MOUTH 2 (TWO) TIMES DAILY WITH A MEAL.     dofetilide  500 MCG capsule  Commonly known as:  TIKOSYN  Take 1 capsule (500 mcg total) by mouth 2 (two) times daily.     glipiZIDE 5 MG 24 hr tablet  Commonly known as:  GLUCOTROL XL  Take 1 tablet (5 mg total) by mouth daily with breakfast.     glucose blood test strip  Commonly known as:  ONE TOUCH ULTRA TEST  CHECK GLUCOSE TWICE DAILY AND AS NEEDED, DX. E11.65 (UNCONTROLLED DM)     losartan 50 MG tablet  Commonly known as:  COZAAR  Take 1 tablet (50 mg total) by mouth daily.     metFORMIN 500 MG tablet  Commonly known as:  GLUCOPHAGE  TAKE 1 TABLET BY MOUTH TWICE DAILY WITH A MEAL     multivitamin tablet  Take 1 tablet by mouth daily.     Potassium Chloride ER 20 MEQ Tbcr  Take 10 mEq by mouth daily. In the evening     potassium chloride SA 20 MEQ tablet  Commonly known as:  KLOR-CON M20  Take 2 tablets (40 mEq total) by mouth daily. In the morning     ranolazine 500 MG 12 hr tablet  Commonly known as:  RANEXA  Take 1 tablet (500 mg total) by mouth 2 (two) times daily.     rivaroxaban 20 MG Tabs tablet  Commonly known as:  XARELTO  Take 1 tablet (20 mg total) by mouth daily with supper.     spironolactone 25 MG tablet  Commonly known as:  ALDACTONE  Take 1 tablet (25 mg total) by mouth daily.     ZEGERID OTC 20-1100 MG Caps capsule  Generic drug:  Omeprazole-Sodium Bicarbonate  Take 1 capsule by mouth daily.        Disposition:  Home Discharge Instructions    Diet - low sodium heart healthy    Complete by:  As directed      Increase activity slowly    Complete by:  As directed           Follow-up Information    Follow up with [name], MD On 10/01/2015.   Specialty:  Cardiology   Why:  10:30AM   Contact information:   [place] [id number] [phone]       Follow up with Baldwin Jamaica, PA-C On 08/27/2015.   Specialty:  Cardiology   Why:  10:30AM   Contact information:   [place]   [id number] [phone]       Duration of Discharge Encounter: Greater than 30 minutes including physician time.  Venetia Night, PA-C 08/15/2015 1:50 PM

## 2015-08-15 NOTE — Progress Notes (Signed)
Provided discharge instructions to patient. Patient verbalizes understanding.

## 2015-08-18 NOTE — Telephone Encounter (Signed)
F/U     Pt needs help w/insurance covering medication (Ranexa 500 mg). Please call.

## 2015-08-18 NOTE — Telephone Encounter (Signed)
Spoke with patient today. He has enough samples to get him through for a few weeks. I will submit a prior auth to [facility].

## 2015-08-19 ENCOUNTER — Telehealth: Payer: Self-pay

## 2015-08-19 NOTE — Telephone Encounter (Signed)
PRIOR auth for Ranexa 500mg  submitted to [facility].

## 2015-08-21 ENCOUNTER — Telehealth: Payer: Self-pay

## 2015-08-21 NOTE — Telephone Encounter (Signed)
Ranexa approved through 2039. Ref [id number].

## 2015-08-26 ENCOUNTER — Encounter: Payer: Self-pay | Admitting: Physician Assistant

## 2015-08-27 ENCOUNTER — Encounter: Payer: BLUE CROSS/BLUE SHIELD | Admitting: Physician Assistant

## 2015-08-28 ENCOUNTER — Encounter: Payer: Self-pay | Admitting: Physician Assistant

## 2015-08-28 ENCOUNTER — Ambulatory Visit (INDEPENDENT_AMBULATORY_CARE_PROVIDER_SITE_OTHER): Payer: BLUE CROSS/BLUE SHIELD | Admitting: Physician Assistant

## 2015-08-28 VITALS — BP 114/78 | HR 79 | Ht 76.0 in | Wt 291.0 lb

## 2015-08-28 DIAGNOSIS — Z5181 Encounter for therapeutic drug level monitoring: Secondary | ICD-10-CM | POA: Diagnosis not present

## 2015-08-28 DIAGNOSIS — I5022 Chronic systolic (congestive) heart failure: Secondary | ICD-10-CM | POA: Diagnosis not present

## 2015-08-28 DIAGNOSIS — I420 Dilated cardiomyopathy: Secondary | ICD-10-CM

## 2015-08-28 DIAGNOSIS — Z79899 Other long term (current) drug therapy: Secondary | ICD-10-CM

## 2015-08-28 DIAGNOSIS — I480 Paroxysmal atrial fibrillation: Secondary | ICD-10-CM

## 2015-08-28 DIAGNOSIS — I471 Supraventricular tachycardia: Secondary | ICD-10-CM | POA: Diagnosis not present

## 2015-08-28 DIAGNOSIS — I429 Cardiomyopathy, unspecified: Secondary | ICD-10-CM

## 2015-08-28 DIAGNOSIS — I10 Essential (primary) hypertension: Secondary | ICD-10-CM

## 2015-08-28 LAB — BASIC METABOLIC PANEL
BUN: 17 mg/dL (ref 7–25)
CHLORIDE: 99 mmol/L (ref 98–110)
CO2: 27 mmol/L (ref 20–31)
Calcium: 9.1 mg/dL (ref 8.6–10.3)
Creat: 0.91 mg/dL (ref 0.70–1.33)
GLUCOSE: 184 mg/dL — AB (ref 65–99)
POTASSIUM: 4.5 mmol/L (ref 3.5–5.3)
SODIUM: 135 mmol/L (ref 135–146)

## 2015-08-28 LAB — MAGNESIUM: Magnesium: 2 mg/dL (ref 1.5–2.5)

## 2015-08-28 NOTE — Patient Instructions (Addendum)
Medication Instructions:   Your physician recommends that you continue on your current medications as directed. Please refer to the Current Medication list given to you today.   If you need a refill on your cardiac medications before your next appointment, please call your pharmacy.  Labwork: BMET AND MAG TODAY    Testing/Procedures: Your physician has recommended that you have a sleep study. This test records several body functions during sleep, including: brain activity, eye movement, oxygen and carbon dioxide blood levels, heart rate and rhythm, breathing rate and rhythm, the flow of air through your mouth and nose, snoring, body muscle movements, and chest and belly movement.    Follow-Up: KEEP APPTS SCHEDULED    Any Other Special Instructions Will Be Listed Below (If Applicable).

## 2015-08-28 NOTE — Progress Notes (Signed)
Cardiology Office Note Date:  08/28/2015  Patient ID:  Aaron Thompson, Aaron Thompson 10-Apr-1960, MRN [id number] PCP:  [name], MD  Electrophysiologist: Dr. Caryl Comes Cardiologist: Dr. [name]   Chief Complaint: s/p hospital visit  History of Present Illness: Aaron Thompson is a 54 y.o. male with history of PAfib, NICM, HTN, morbid obesity, OSA, reports unable to tolerate CPAP, DM, LBBB comes in today to be seen for Dr. Caryl Comes.  He waslast seen out patient by EP service, y myself 07/31/15 to f/u on a hospital stay from  07/29/15 he was seen in the ER with palpitations/SOB that woke him, he was found to be in AFib with RVR and had DCCV in the ER.  No changes were made to f/u with sleep study and apnea tx.  He was unfortunately hospitalized again with RAFib 08/12/15 and had DCCV in ED with ERAF the same day and returned to the ED and was admitted, he was observed to have bothe AFib and ATach.   his coreg further up-titrated and ranolazine added.[name]  His K+ was increased with hypokalemia again noted.  He comes in today  feeling well, has not felt palpitations, no CP or SOB, no near syncope or syncope.  He denies any bleeding or signs of bleeding. Pending sleep study, ? Unable to be done until October.  He sees Dr. Rayann Heman to discuss possible ablation next month.   AFib Hx: ER visits with RVR requiring DCCV Feb and March July 2017 x2, previous DCCV 2011, 2013 Rapid Afib with inapproriate shocks April 2016, Feb 2017 Tikosyn Xarelto Genetic AF trial visit 06/18/15, screen fail, negative for gene  Device History:  STJ CRTD implanted 2015 for NICM History of appropriate therapy: No - inappropriate therapy for AF with RVR History of AAD therapy: Yes - Tikosyn for AF    Past Medical History:  Diagnosis Date  . Anxiety state, unspecified   . Calculus of kidney 1981   "passed it on my own"  . Chronic systolic CHF (congestive heart failure) ([place])   . Dermatophytosis of the body   . DJD (degenerative joint disease)    . Esophageal reflux   . Hx of colonic polyps   . Hypertension   . LBBB (left bundle branch block)    intermittent  . Non-ischemic cardiomyopathy ([place])    a. cath 2013: minor nonobstructive CAD.  . OSA on CPAP   . Osteoarthrosis, unspecified whether generalized or localized, hand   . Other chronic nonalcoholic liver disease    fatty liver  . PAF (paroxysmal atrial fibrillation) ([place])    a. s/p DCCV 6/11; previously on Pradaxa;  b. Event Monitor 2012->No PAF;  c. 09/2011 s/p DCCV ->Xarelto started. d. 03/2014: inappropriate ICD shocks for AF-RVR, started on Tikosyn, Xarelto restarted.  . Type II diabetes mellitus ([place])   . Unspecified hearing loss     Past Surgical History:  Procedure Laterality Date  . Abdominal US  01/2007   Fatty liver, no gallstones  . APPENDECTOMY    . BI-VENTRICULAR IMPLANTABLE CARDIOVERTER DEFIBRILLATOR [name] 02/14/2013   STJ CRTD implanted by Dr Caryl Comes  . BILATERAL KNEE ARTHROSCOPY    . CARDIOVERSION  09/29/2011   Procedure: CARDIOVERSION;  Surgeon: Thayer Headings, MD;  Location: [facility];  Service: Cardiovascular;  Laterality: [name];  . DOPPLER ECHOCARDIOGRAPHY  11/2009   Decreased EF of 35-40%  . LEFT HEART CATHETERIZATION WITH CORONARY ANGIOGRAM [name] 08/18/2011   Procedure: LEFT HEART CATHETERIZATION WITH CORONARY ANGIOGRAM;  Surgeon: Ander Slade  [place], MD;  [facility];  Service: Cardiovascular;  Laterality: [name];  . NECK SURGERY     plate/fusion  . Stress Cardiolite  08/2005   Normal, EF 42%  . UPPER GASTROINTESTINAL ENDOSCOPY  08/2006   GERD    Current Outpatient Prescriptions  Medication Sig Dispense Refill  . albuterol (PROVENTIL HFA;VENTOLIN HFA) 108 (90 Base) MCG/ACT inhaler Inhale 2 puffs into the lungs every 4 (four) hours as needed for wheezing or shortness of breath. 1 Inhaler 1  . benzonatate (TESSALON) 200 MG capsule Take 1 capsule (200 mg total) by mouth 3 (three) times daily as needed for cough (swallow whole do not bite pill). 30 capsule  1  . carvedilol (COREG) 25 MG tablet Take 2 tablets (50 mg total) by mouth 2 (two) times daily with a meal. TAKE 1 AND 1/2 TABLETS (37.5 MG TOTAL) BY MOUTH 2 (TWO) TIMES DAILY WITH A MEAL. 120 tablet 6  . dofetilide (TIKOSYN) 500 MCG capsule Take 1 capsule (500 mcg total) by mouth 2 (two) times daily. 60 capsule 6  . glipiZIDE (GLUCOTROL XL) 5 MG 24 hr tablet Take 1 tablet (5 mg total) by mouth daily with breakfast. 30 tablet 11  . glucose blood (ONE TOUCH ULTRA TEST) test strip CHECK GLUCOSE TWICE DAILY AND AS NEEDED, DX. E11.65 (UNCONTROLLED DM) 100 each 5  . losartan (COZAAR) 50 MG tablet Take 1 tablet (50 mg total) by mouth daily. 30 tablet 5  . metFORMIN (GLUCOPHAGE) 500 MG tablet TAKE 1 TABLET BY MOUTH TWICE DAILY WITH A MEAL 60 tablet 11  . Multiple Vitamin (MULTIVITAMIN) tablet Take 1 tablet by mouth daily.      [name] Bicarbonate (ZEGERID OTC) 20-1100 MG CAPS Take 1 capsule by mouth daily.     . potassium chloride 20 MEQ TBCR Take 10 mEq by mouth daily. In the evening 30 tablet 3  . potassium chloride SA (KLOR-CON M20) 20 MEQ tablet Take 2 tablets (40 mEq total) by mouth daily. In the morning 60 tablet 11  . ranolazine (RANEXA) 500 MG 12 hr tablet Take 1 tablet (500 mg total) by mouth 2 (two) times daily. 60 tablet 6  . spironolactone (ALDACTONE) 25 MG tablet Take 1 tablet (25 mg total) by mouth daily. 30 tablet 8  . XARELTO 20 MG TABS tablet TAKE 1 TABLET BY MOUTH ONCE DAILY WITH SUPPER 30 tablet 2   No current facility-administered medications for this visit.     Allergies:   Review of patient's allergies indicates no known allergies.   Social History:  The patient  reports that he has never smoked. He has quit using smokeless tobacco. His smokeless tobacco use included Snuff. He reports that he does not drink alcohol or use drugs.   Family History:  The patient's family history includes Diabetes in his brother, brother, father, and mother; Heart attack in his paternal  grandfather; Heart failure in his father and mother; Hypertension in his father and mother; Kidney disease in his brother; Leukemia in his sister; Multiple myeloma in his sister; Other in his sister; Stroke in his paternal grandmother.  ROS:  Please see the history of present illness.    All other systems are reviewed and otherwise negative.   PHYSICAL EXAM:  VS:  BP 114/78 (BP Location: Left Arm, Patient Position: Sitting, Cuff Size: Large)   Pulse 79   Ht '6\' 4"'  (1.93 m)   Wt 291 lb (132 kg)   BMI 35.42 kg/m  BMI: Body mass index  is 35.42 kg/m. Well nourished, obese, well developed, in no acute distress  HEENT: normocephalic, atraumatic  Neck: no JVD, carotid bruits or masses Cardiac:  RRR; no significant murmurs, no rubs, or gallops Lungs:  clear to auscultation bilaterally, no wheezing, rhonchi or rales  Abd: soft, nontender MS: no deformity or atrophy Ext: no edema  Skin: warm and dry, no rash Neuro:  No gross deficits appreciated Psych: euthymic mood, full affect  ICD site is stable, no tethering or discomfort   EKG:  Done today and reviewed by myself shows SR, V paced,  QTc stable ICD interrogation today: Normal device function, battery status OK, 98% BiVe pacing, 2 very brief ATach episodes, 3 and 2 seconds since last eval in the hospital, no VT  04/19/14: Echocardiogram Study Conclusions - Left ventricle: LVEF is severely depressed at approximately 15 to 20% with diffuse hypokinesis. The cavity size was severely dilated. Wall thickness was normal. Doppler parameters are consistent with abnormal left ventricular relaxation (grade 1 diastolic dysfunction). - Mitral valve: There was mild regurgitation. - Left atrium: The atrium was mildly dilated. 47m - Right ventricle: The cavity size was mildly dilated. Systolic function was moderately to severely reduced.   09/22/11; CPX Conclusion: Exercise testing with gas exchange demonstrated a normal functional  capacity when compared to matched sedentary norms. There is no evidence of a cardiovascular limitation to the exercise. Based on his absolute oxygen uptake, the patient is actually in quite good cardiovascular shape however his actual function capacity is markedly limited by his obesity and related ventilatory limitation.  Recent Labs: 04/07/2015: ALT 23 07/29/2015: B Natriuretic Peptide 24.9; TSH 2.041 08/13/2015: Hemoglobin 14.0; Platelets 155 08/15/2015: BUN 15; Creatinine, Ser 0.91; Magnesium 2.0; Potassium 3.8; Sodium 135  04/07/2015: Cholesterol 149; HDL 29.70; LDL Cholesterol 94; Total CHOL/HDL Ratio 5; Triglycerides 129.0; VLDL 25.8   Estimated Creatinine Clearance: 139.3 mL/min (by C-G formula based on SCr of 0.91 mg/dL).   Wt Readings from Last 3 Encounters:  08/28/15 291 lb (132 kg)  08/15/15 282 lb 10.1 oz (128.2 kg)  08/11/15 299 lb 8 oz (135.9 kg)     Other studies reviewed: Additional studies/records reviewed today include: summarized above  DEVICE information: SJM CRT-D, implanted 12/25/13, Dr. KCaryl ComesThe patient has had inappropriate shocks for rapid AF  ASSESSMENT AND PLAN:  1. PAFib    CHA2DS2Vasc is at least 3, on Xarelto    Multiple DCCV    On Tikosyn, QTC is stable    08/15/15: K+ 3.8, mag 2.0, creat 0.91    08/13/15  H/H 14/42      2. NICM/ICD     normal device function     98% bive paced     corvue is down, though so is his weight, exam is euvolemic and no symptoms of fluid OL     On BB/ARB  3. HTN     stable  4. Obesity, OSA     reports CPAP unable to tolerate mask, would like to visit with sleep apnea specialist to discuss options, he thinks his AF more often triggered at night  Disposition: BMET f/u on K+, mag for his TSandria [place] he sees Dr. ARayann Hemannext month, f/u pending that visit.  Current medicines are reviewed at length with the patient today.  The patient did not have any concerns regarding medicines.  SHaywood Lasso PA-C 08/28/2015 8:52  AM     [facility] [address] [place] [phone](office)  [phone]  [phone] (fax)

## 2015-08-29 ENCOUNTER — Encounter: Payer: Self-pay | Admitting: Internal Medicine

## 2015-08-29 ENCOUNTER — Telehealth: Payer: Self-pay | Admitting: *Deleted

## 2015-08-29 NOTE — Telephone Encounter (Signed)
SPOKE TO PT ABOUT RESULTS AND VERBALIZED UNDERSTANDING  

## 2015-08-29 NOTE — Telephone Encounter (Signed)
-----   Message from [facility] [place] sent at 08/28/2015  6:03 PM EDT ----- Please let the patient know his labs look good, potassium is within normal range, no changes.  Thanks [employer]

## 2015-09-08 ENCOUNTER — Encounter: Payer: Self-pay | Admitting: Cardiovascular Disease

## 2015-09-22 ENCOUNTER — Encounter: Payer: Self-pay | Admitting: Cardiovascular Disease

## 2015-09-22 ENCOUNTER — Ambulatory Visit (INDEPENDENT_AMBULATORY_CARE_PROVIDER_SITE_OTHER): Payer: BLUE CROSS/BLUE SHIELD | Admitting: Cardiovascular Disease

## 2015-09-22 VITALS — BP 124/92 | HR 84 | Ht 76.0 in | Wt 287.8 lb

## 2015-09-22 DIAGNOSIS — I428 Other cardiomyopathies: Secondary | ICD-10-CM

## 2015-09-22 DIAGNOSIS — I429 Cardiomyopathy, unspecified: Secondary | ICD-10-CM

## 2015-09-22 NOTE — Progress Notes (Signed)
Cardiology Office Note Date:  09/22/2015  Patient ID:  Aaron Thompson [birth year], MRN [id number] PCP:  [name], MD  Electrophysiologist: Dr. Caryl Comes Cardiologist: Dr. [name]   Chief Complaint: s/p hospital visit  History of Present Illness: Aaron Thompson is a 55 y.o. male with history of PAfib, NICM, HTN, morbid obesity, OSA, reports unable to tolerate CPAP, DM, LBBB   Hospital stay from  07/29/15 he was seen in the ER with palpitations/SOB that woke him, he was found to be in AFib with RVR and had DCCV in the ER.  No changes were made to f/u with sleep study and apnea tx.  He was unfortunately hospitalized again with RAFib 08/12/15 and had DCCV in ED with ERAF the same day and returned to the ED and was admitted, he was observed to have bothe AFib and ATach.   his coreg further up-titrated and ranolazine added.[name]  His K+ was increased with hypokalemia again noted.   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 Anxiety state, unspecified   . Calculus of kidney 1981   "passed it on my own"  . Chronic systolic CHF (congestive heart failure) ([place])   . Dermatophytosis of the body   . DJD (degenerative joint disease)   . Esophageal reflux   . Hx of colonic polyps   . Hypertension   . LBBB (left bundle branch block)    intermittent  . Non-ischemic cardiomyopathy ([place])    a. cath 2013: minor nonobstructive CAD.  . OSA on CPAP   . Osteoarthrosis, unspecified whether generalized or  localized, hand   . Other chronic nonalcoholic liver disease    fatty liver  . PAF (paroxysmal atrial fibrillation) ([place])    a. s/p DCCV 6/11; previously on Pradaxa;  b. Event Monitor 2012->No PAF;  c. 09/2011 s/p DCCV ->Xarelto started. d. 03/2014: inappropriate ICD shocks for AF-RVR, started on Tikosyn, Xarelto restarted.  . Type II diabetes mellitus ([place])   . Unspecified hearing loss     Past Surgical History:  Procedure Laterality Date  . Abdominal US  01/2007   Fatty liver, no gallstones  . APPENDECTOMY    . BI-VENTRICULAR IMPLANTABLE CARDIOVERTER DEFIBRILLATOR [name] 02/14/2013   STJ CRTD implanted by Dr Caryl Comes  . BILATERAL KNEE ARTHROSCOPY    . CARDIOVERSION  09/29/2011   Procedure: CARDIOVERSION;  Surgeon: Thayer Headings, MD;  Location: [place];  Service: Cardiovascular;  Laterality: [name];  . DOPPLER ECHOCARDIOGRAPHY  11/2009   Decreased EF of 35-40%  . LEFT HEART CATHETERIZATION WITH CORONARY ANGIOGRAM [name] 08/18/2011   Procedure: LEFT HEART CATHETERIZATION WITH CORONARY ANGIOGRAM;  Surgeon:  M Martinique, MD;  Location: [facility];  Service: Cardiovascular;  Laterality: [name];  . NECK SURGERY     plate/fusion  . Stress Cardiolite  08/2005   Normal, EF 42%  . UPPER GASTROINTESTINAL ENDOSCOPY  08/2006   GERD    Current Outpatient Prescriptions  Medication Sig Dispense  Refill  . albuterol (PROVENTIL HFA;VENTOLIN HFA) 108 (90 Base) MCG/ACT inhaler Inhale 2 puffs into the lungs every 4 (four) hours as needed for wheezing or shortness of breath. 1 Inhaler 1  . benzonatate (TESSALON) 200 MG capsule Take 1 capsule (200 mg total) by mouth 3 (three) times daily as needed for cough (swallow whole do not bite pill). 30 capsule 1  . carvedilol (COREG) 25 MG tablet Take 2 tablets (50 mg total) by mouth 2 (two) times daily with a meal. TAKE 1 AND 1/2 TABLETS (37.5 MG TOTAL) BY MOUTH 2 (TWO) TIMES DAILY WITH A MEAL. 120 tablet 6  . dofetilide (TIKOSYN) 500 MCG capsule Take 1 capsule (500 mcg total)  by mouth 2 (two) times daily. 60 capsule 6  . glipiZIDE (GLUCOTROL XL) 5 MG 24 hr tablet Take 1 tablet (5 mg total) by mouth daily with breakfast. 30 tablet 11  . glucose blood (ONE TOUCH ULTRA TEST) test strip CHECK GLUCOSE TWICE DAILY AND AS NEEDED, DX. E11.65 (UNCONTROLLED DM) 100 each 5  . losartan (COZAAR) 50 MG tablet Take 1 tablet (50 mg total) by mouth daily. 30 tablet 5  . metFORMIN (GLUCOPHAGE) 500 MG tablet TAKE 1 TABLET BY MOUTH TWICE DAILY WITH A MEAL 60 tablet 11  . Multiple Vitamin (MULTIVITAMIN) tablet Take 1 tablet by mouth daily.      Aaron Thompson (ZEGERID OTC) 20-1100 MG CAPS Take 1 capsule by mouth daily.     . Potassium Chloride ER 20 MEQ TBCR Take 2 tablets by mouth in the AM & 1/2 tablet by mouth in the PM  11  . ranolazine (RANEXA) 500 MG 12 hr tablet Take 1 tablet (500 mg total) by mouth 2 (two) times daily. 60 tablet 6  . spironolactone (ALDACTONE) 25 MG tablet Take 1 tablet (25 mg total) by mouth daily. 30 tablet 8  . XARELTO 20 MG TABS tablet TAKE 1 TABLET BY MOUTH ONCE DAILY WITH SUPPER 30 tablet 2   No current facility-administered medications for this visit.     Allergies:   Review of patient's allergies indicates no known allergies.   Social History:  The patient  reports that he has never smoked. He has quit using smokeless tobacco. His smokeless tobacco use included Snuff. He reports that he does not drink alcohol or use drugs.   Family History:  The patient's family history includes Diabetes in his brother, brother, father, and mother; Heart attack in his paternal grandfather; Heart failure in his father and mother; Hypertension in his father and mother; Kidney disease in his brother; Leukemia in his sister; Multiple myeloma in his sister; Other in his sister; Stroke in his paternal grandmother.  ROS:  Please see the history of present illness.    All other systems are reviewed and otherwise negative.   PHYSICAL EXAM:  VS:  BP (!) 124/92  (BP Location: Right Arm, Patient Position: Sitting, Cuff Size: Large)   Pulse 84   Ht 6' 4" (1.93 m)   Wt 287 lb 12.8 oz (130.5 kg)   SpO2 98%   BMI 35.03 kg/m  BMI: Body mass index is 35.03 kg/m. Well nourished, obese, well developed, in no acute distress  HEENT: normocephalic, atraumatic  Neck: no JVD, carotid bruits or masses Cardiac:  RRR; no significant murmurs, no rubs, or gallops Lungs:  clear to auscultation bilaterally, no wheezing, rhonchi or rales  Abd: soft, nontender MS: no deformity or atrophy Ext: no edema  Skin: warm and dry, no rash  Neuro:  No gross deficits appreciated Psych: euthymic mood, full affect  ICD site is stable, no tethering or discomfort   EKG:  Done today and reviewed by myself shows SR, V paced,  QTc stable ICD interrogation today: Normal device function, battery status OK, 98% BiVe pacing, 2 very brief ATach episodes, 3 and 2 seconds since last eval in the hospital, no VT  04/19/14: Echocardiogram Study Conclusions - Left ventricle: LVEF is severely depressed at approximately 15 to 20% with diffuse hypokinesis. The cavity size was severely dilated. Wall thickness was normal. Doppler parameters are consistent with abnormal left ventricular relaxation (grade 1 diastolic dysfunction). - Mitral valve: There was mild regurgitation. - Left atrium: The atrium was mildly dilated. 68m - Right ventricle: The cavity size was mildly dilated. Systolic function was moderately to severely reduced.   09/22/11; CPX Conclusion: Exercise testing with gas exchange demonstrated a normal functional capacity when compared to matched sedentary norms. There is no evidence of a cardiovascular limitation to the exercise. Based on his absolute oxygen uptake, the patient is actually in quite good cardiovascular shape however his actual function capacity is markedly limited by his obesity and related ventilatory limitation.  Recent Labs: 04/07/2015: ALT  23 07/29/2015: B Natriuretic Peptide 24.9; TSH 2.041 08/13/2015: Hemoglobin 14.0; Platelets 155 08/28/2015: BUN 17; Creat 0.91; Magnesium 2.0; Potassium 4.5; Sodium 135  04/07/2015: Cholesterol 149; HDL 29.70; LDL Cholesterol 94; Total CHOL/HDL Ratio 5; Triglycerides 129.0; VLDL 25.8   CrCl cannot be calculated (Patient's most recent lab result is older than the maximum 21 days allowed.).   Wt Readings from Last 3 Encounters:  09/22/15 287 lb 12.8 oz (130.5 kg)  08/28/15 291 lb (132 kg)  08/15/15 282 lb 10.1 oz (128.2 kg)     Other studies reviewed: Additional studies/records reviewed today include: summarized above  DEVICE information: SJM CRT-D, implanted 12/25/13, Dr. KCaryl ComesThe patient has had inappropriate shocks for rapid AF  ASSESSMENT AND PLAN:  1. PAFib    CHA2DS2Vasc is at least 3, on Xarelto    Multiple DCCV    On Tikosyn, QTC is stable    08/15/15: K+ 3.8, mag 2.0, creat 0.91    08/13/15  H/H 14/42      2. NICM/ICD     normal device function     98% bive paced     corvue is down, though so is his weight, exam is euvolemic and no symptoms of fluid OL     On BB/ARB  3. HTN     stable  4. Obesity, OSA     reports CPAP unable to tolerate mask, would like to visit with sleep apnea specialist to discuss options, he thinks his AF more often triggered at night  Disposition:  F/U with Dr ARayann Heman9/8/17 Agree with ablation He will need to address both Atrial tachycardia and PAF.   PJenkins Rouge

## 2015-09-22 NOTE — Patient Instructions (Signed)

## 2015-09-26 HISTORY — PX: ATRIAL TACH ABLATION: EP1192

## 2015-10-01 ENCOUNTER — Encounter: Payer: Self-pay | Admitting: Internal Medicine

## 2015-10-01 ENCOUNTER — Ambulatory Visit (INDEPENDENT_AMBULATORY_CARE_PROVIDER_SITE_OTHER): Payer: BLUE CROSS/BLUE SHIELD | Admitting: Internal Medicine

## 2015-10-01 VITALS — BP 116/78 | HR 74 | Ht 76.0 in | Wt 290.6 lb

## 2015-10-01 DIAGNOSIS — I10 Essential (primary) hypertension: Secondary | ICD-10-CM

## 2015-10-01 DIAGNOSIS — I429 Cardiomyopathy, unspecified: Secondary | ICD-10-CM

## 2015-10-01 DIAGNOSIS — I428 Other cardiomyopathies: Secondary | ICD-10-CM

## 2015-10-01 DIAGNOSIS — I5022 Chronic systolic (congestive) heart failure: Secondary | ICD-10-CM | POA: Diagnosis not present

## 2015-10-01 DIAGNOSIS — I480 Paroxysmal atrial fibrillation: Secondary | ICD-10-CM

## 2015-10-01 DIAGNOSIS — I471 Supraventricular tachycardia: Secondary | ICD-10-CM | POA: Diagnosis not present

## 2015-10-01 LAB — CBC WITH DIFFERENTIAL/PLATELET
BASOS ABS: 0 {cells}/uL (ref 0–200)
Basophils Relative: 0 %
EOS PCT: 2 %
Eosinophils Absolute: 174 cells/uL (ref 15–500)
HEMATOCRIT: 42.8 % (ref 38.5–50.0)
HEMOGLOBIN: 14.6 g/dL (ref 13.2–17.1)
LYMPHS ABS: 1914 {cells}/uL (ref 850–3900)
Lymphocytes Relative: 22 %
MCH: 29.2 pg (ref 27.0–33.0)
MCHC: 34.1 g/dL (ref 32.0–36.0)
MCV: 85.6 fL (ref 80.0–100.0)
MONO ABS: 870 {cells}/uL (ref 200–950)
MPV: 10.2 fL (ref 7.5–12.5)
Monocytes Relative: 10 %
NEUTROS PCT: 66 %
Neutro Abs: 5742 cells/uL (ref 1500–7800)
Platelets: 191 10*3/uL (ref 140–400)
RBC: 5 MIL/uL (ref 4.20–5.80)
RDW: 13.9 % (ref 11.0–15.0)
WBC: 8.7 10*3/uL (ref 3.8–10.8)

## 2015-10-01 LAB — BASIC METABOLIC PANEL
BUN: 16 mg/dL (ref 7–25)
CALCIUM: 9.3 mg/dL (ref 8.6–10.3)
CO2: 26 mmol/L (ref 20–31)
Chloride: 102 mmol/L (ref 98–110)
Creat: 0.77 mg/dL (ref 0.70–1.33)
GLUCOSE: 116 mg/dL — AB (ref 65–99)
Potassium: 4.5 mmol/L (ref 3.5–5.3)
SODIUM: 139 mmol/L (ref 135–146)

## 2015-10-01 NOTE — Patient Instructions (Signed)
Medication Instructions:  Your physician recommends that you continue on your current medications as directed. Please refer to the Current Medication list given to you today.   Labwork: Your physician recommends that you return for lab work today: BMP/CBC   Testing/Procedures:  Your physician has requested that you have an echocardiogram. Echocardiography is a painless test that uses sound waves to create images of your heart. It provides your doctor with information about the size and shape of your heart and how well your heart's chambers and valves are working. This procedure takes approximately one hour. There are no restrictions for this procedure.--ASAP  Your physician has requested that you have cardiac CT. Cardiac computed tomography (CT) is a painless test that uses an x-ray machine to take clear, detailed pictures of your heart. For further information please visit [facility]. Please follow instruction sheet as given.needs week of 10/06/15---Nishan to read if possible   Your physician has recommended that you have an ablation. Catheter ablation is a medical procedure used to treat some cardiac arrhythmias (irregular heartbeats). During catheter ablation, a long, thin, flexible tube is put into a blood vessel in your groin (upper thigh), or neck. This tube is called an ablation catheter. It is then guided to your heart through the blood vessel. Radio frequency waves destroy small areas of heart tissue where abnormal heartbeats may cause an arrhythmia to start. Please see the instruction sheet given to you today.--10/14/15   Please arrive at the [place] [facility] at 5:30am  Do not eat or drink after midnight the night prior to the procedure Do not take any medications the morning of the procedure Plan for one night stay   Follow-Up: Your physician recommends that you schedule a follow-up appointment in: 4 weeks from 10/14/15 with Roderic Palau, NP  and 3 months from 10/14/15 with Dr Rayann Heman   Any Other Special Instructions Will Be Listed Below (If Applicable).     If you need a refill on your cardiac medications before your next appointment, please call your pharmacy.

## 2015-10-01 NOTE — Progress Notes (Signed)
Electrophysiology Office Note   Date:  10/01/2015   ID:  Aaron Thompson, DOB 1960-06-07, MRN [id number]  PCP:  [name], MD  Cardiologist:  Dr [name] Primary Electrophysiologist: Dr Caryl Comes  Chief Complaint  Patient presents with  . Atrial Fibrillation     History of Present Illness: Aaron Thompson is a 55 y.o. male who presents today for electrophysiology evaluation.   The patient has struggled with symptomatic atrial arrhythmias.  He has failed medical therapy with tikosyn.  He has had multiple prior cardioversions.  He also has OSA, obesity, and a nonischemic CM.  He has not had an echo since 3/16.  He is quite symptomatic with his afib.  He has also had inappropriate ICD shocks related to his afib.  Today, he denies symptoms of palpitations, chest pain, shortness of breath, orthopnea, PND, lower extremity edema, claudication, dizziness, presyncope, syncope, bleeding, or neurologic sequela. The patient is tolerating medications without difficulties and is otherwise without complaint today.    Past Medical History:  Diagnosis Date  . Anxiety state, unspecified   . Calculus of kidney 1981   "passed it on my own"  . Chronic systolic CHF (congestive heart failure) ([place])   . Dermatophytosis of the body   . DJD (degenerative joint disease)   . Esophageal reflux   . Hx of colonic polyps   . Hypertension   . LBBB (left bundle branch block)    intermittent  . Non-ischemic cardiomyopathy ([place])    a. cath 2013: minor nonobstructive CAD.  . OSA on CPAP   . Osteoarthrosis, unspecified whether generalized or localized, hand   . Other chronic nonalcoholic liver disease    fatty liver  . PAF (paroxysmal atrial fibrillation) ([place])    a. s/p DCCV 6/11; previously on Pradaxa;  b. Event Monitor 2012->No PAF;  c. 09/2011 s/p DCCV ->Xarelto started. d. 03/2014: inappropriate ICD shocks for AF-RVR, started on Tikosyn, Xarelto restarted.  . Type II diabetes mellitus ([place])   . Unspecified  hearing loss    Past Surgical History:  Procedure Laterality Date  . Abdominal US  01/2007   Fatty liver, no gallstones  . APPENDECTOMY    . BI-VENTRICULAR IMPLANTABLE CARDIOVERTER DEFIBRILLATOR [name] 02/14/2013   STJ CRTD implanted by Dr Caryl Comes  . BILATERAL KNEE ARTHROSCOPY    . CARDIOVERSION  09/29/2011   Procedure: CARDIOVERSION;  Surgeon: Thayer Headings, MD;  Location: [place];  Service: Cardiovascular;  Laterality: [name];  . DOPPLER ECHOCARDIOGRAPHY  11/2009   Decreased EF of 35-40%  . LEFT HEART CATHETERIZATION WITH CORONARY ANGIOGRAM [name] 08/18/2011   Procedure: LEFT HEART CATHETERIZATION WITH CORONARY ANGIOGRAM;  Surgeon: Peter M Martinique, MD;  Location[facility] C[facility] LAB;  Service: Cardiovascular;  Laterality: [name];  . NECK SURGERY     plate/fusion  . Stress Cardiolite  08/2005   Normal, EF 42%  . UPPER GASTROINTESTINAL ENDOSCOPY  08/2006   GERD     Current Outpatient Prescriptions  Medication Sig Dispense Refill  . carvedilol (COREG) 25 MG tablet Take 2 tablets (50 mg total) by mouth 2 (two) times daily with a meal. TAKE 1 AND 1/2 TABLETS (37.5 MG TOTAL) BY MOUTH 2 (TWO) TIMES DAILY WITH A MEAL. 120 tablet 6  . dofetilide (TIKOSYN) 500 MCG capsule Take 1 capsule (500 mcg total) by mouth 2 (two) times daily. 60 capsule 6  . glipiZIDE (GLUCOTROL XL) 5 MG 24 hr tablet Take 1 tablet (5 mg total) by mouth daily with breakfast. 30 tablet 11  .  glucose blood (ONE TOUCH ULTRA TEST) test strip CHECK GLUCOSE TWICE DAILY AND AS NEEDED, DX. E11.65 (UNCONTROLLED DM) 100 each 5  . losartan (COZAAR) 50 MG tablet Take 1 tablet (50 mg total) by mouth daily. 30 tablet 5  . metFORMIN (GLUCOPHAGE) 500 MG tablet TAKE 1 TABLET BY MOUTH TWICE DAILY WITH A MEAL 60 tablet 11  . Multiple Vitamin (MULTIVITAMIN) tablet Take 1 tablet by mouth daily.      [name] Bicarbonate (ZEGERID OTC) 20-1100 MG CAPS Take 1 capsule by mouth daily.     . Potassium Chloride ER 20 MEQ TBCR Take 2 tablets by mouth in the AM &  1/2 tablet by mouth in the PM  11  . ranolazine (RANEXA) 500 MG 12 hr tablet Take 1 tablet (500 mg total) by mouth 2 (two) times daily. 60 tablet 6  . spironolactone (ALDACTONE) 25 MG tablet Take 1 tablet (25 mg total) by mouth daily. 30 tablet 8  . XARELTO 20 MG TABS tablet TAKE 1 TABLET BY MOUTH ONCE DAILY WITH SUPPER 30 tablet 2  . albuterol (PROVENTIL HFA;VENTOLIN HFA) 108 (90 Base) MCG/ACT inhaler Inhale 2 puffs into the lungs every 4 (four) hours as needed for wheezing or shortness of breath. (Patient not taking: Reported on 10/01/2015) 1 Inhaler 1  . benzonatate (TESSALON) 200 MG capsule Take 1 capsule (200 mg total) by mouth 3 (three) times daily as needed for cough (swallow whole do not bite pill). (Patient not taking: Reported on 10/01/2015) 30 capsule 1   No current facility-administered medications for this visit.     Allergies:   Review of patient's allergies indicates no known allergies.   Social History:  The patient  reports that he has never smoked. He has quit using smokeless tobacco. His smokeless tobacco use included Snuff. He reports that he does not drink alcohol or use drugs.   Family History:  The patient's family history includes Diabetes in his brother, brother, father, and mother; Heart attack in his paternal grandfather; Heart failure in his father and mother; Hypertension in his father and mother; Kidney disease in his brother; Leukemia in his sister; Multiple myeloma in his sister; Other in his sister; Stroke in his paternal grandmother.    ROS:  Please see the history of present illness.   All other systems are reviewed and negative.    PHYSICAL EXAM: VS:  BP 116/78   Pulse 74   Ht '6\' 4"'  (1.93 m)   Wt 290 lb 9.6 oz (131.8 kg)   SpO2 96%   BMI 35.37 kg/m  , BMI Body mass index is 35.37 kg/m. GEN: Well nourished, well developed, in no acute distress  HEENT: normal  Neck: no JVD, carotid bruits, or masses Cardiac: RRR; no murmurs, rubs, or gallops,no edema    Respiratory:  clear to auscultation bilaterally, normal work of breathing GI: soft, nontender, nondistended, + BS MS: no deformity or atrophy  Skin: warm and dry, device pocket is well healed Neuro:  Strength and sensation are intact Psych: euthymic mood, full affect  EKG:  EKG is ordered today. The ekg ordered today shows sinus with BiV pacing, Qtc 531 msec  Device interrogation is reviewed today in detail.  See PaceArt for details.   Recent Labs: 04/07/2015: ALT 23 07/29/2015: B Natriuretic Peptide 24.9; TSH 2.041 08/13/2015: Hemoglobin 14.0; Platelets 155 08/28/2015: BUN 17; Creat 0.91; Magnesium 2.0; Potassium 4.5; Sodium 135    Lipid Panel     Component Value Date/Time   CHOL  149 04/07/2015 0834   TRIG 129.0 04/07/2015 0834   HDL 29.70 (L) 04/07/2015 0834   CHOLHDL 5 04/07/2015 0834   VLDL 25.8 04/07/2015 0834   LDLCALC 94 04/07/2015 0834   LDLDIRECT 109.1 02/01/2013 1516     Wt Readings from Last 3 Encounters:  10/01/15 290 lb 9.6 oz (131.8 kg)  09/22/15 287 lb 12.8 oz (130.5 kg)  08/28/15 291 lb (132 kg)      Other studies Reviewed: Additional studies/ records that were reviewed today include: hospital records, prior echo, Dr Aquilla Hacker notes, Dr Forde Dandy consult note, Dr Mariana Arn recent note, and Carolan Clines ntoes  Review of the above records today demonstrates: as above   ASSESSMENT AND PLAN:  1.  Persistent atrial fibrillation The patient has symptomatic atrial arrhythmias including afib and (per Dr Mariana Arn notes atach).  He has failed medical therapy with tikosyn.  Therapeutic strategies for afib including medicine and ablation were discussed in detail with the patient today. Risk, benefits, and alternatives to EP study and radiofrequency ablation were also discussed in detail today. These risks include but are not limited to stroke, bleeding, vascular damage, tamponade, perforation, damage to the esophagus, lungs, and other structures, pulmonary vein stenosis,  worsening renal function, ICD lead dislodgement, and death. The patient understands these risk and wishes to proceed.  We will therefore proceed with catheter ablation at the next available time.  Will obtain 2d echo to evaluate for structural changes related to afib.  Will anticipate cardiac CT within 1 week of ablation to evaluate LA appendage for thrombus.  No changes today  2. Nonischemic CM Device interrogation is reviewed today and normal No changes See paceart   Current medicines are reviewed at length with the patient today.   The patient does not have concerns regarding his medicines.  The following changes were made today:  none    Signed, [name], MD  10/01/2015 11:29 AM     [facility] [address] [place] [address] [phone] (office) [phone] (fax)

## 2015-10-02 LAB — CUP PACEART INCLINIC DEVICE CHECK
Date Time Interrogation Session: 20170907164941
Implantable Lead Implant Date: 20150121
Implantable Lead Implant Date: 20150121
Implantable Lead Location: 753858
Implantable Lead Location: 753860
Implantable Lead Model: 7122
Lead Channel Pacing Threshold Amplitude: 2.25 V
Lead Channel Pacing Threshold Pulse Width: 0.5 ms
Lead Channel Sensing Intrinsic Amplitude: 11.8 mV
Lead Channel Sensing Intrinsic Amplitude: 5 mV
MDC IDC LEAD IMPLANT DT: 20150121
MDC IDC LEAD LOCATION: 753859
MDC IDC MSMT LEADCHNL RA PACING THRESHOLD AMPLITUDE: 0.75 V
MDC IDC MSMT LEADCHNL RA PACING THRESHOLD PULSEWIDTH: 0.5 ms
MDC IDC MSMT LEADCHNL RV PACING THRESHOLD AMPLITUDE: 1 V
MDC IDC MSMT LEADCHNL RV PACING THRESHOLD PULSEWIDTH: 0.5 ms
Pulse Gen Serial Number: 7133975

## 2015-10-06 ENCOUNTER — Encounter: Payer: Self-pay | Admitting: Internal Medicine

## 2015-10-06 ENCOUNTER — Ambulatory Visit (HOSPITAL_COMMUNITY): Payer: BLUE CROSS/BLUE SHIELD | Attending: Internal Medicine

## 2015-10-06 DIAGNOSIS — I059 Rheumatic mitral valve disease, unspecified: Secondary | ICD-10-CM | POA: Diagnosis not present

## 2015-10-06 DIAGNOSIS — I509 Heart failure, unspecified: Secondary | ICD-10-CM | POA: Diagnosis not present

## 2015-10-06 DIAGNOSIS — I313 Pericardial effusion (noninflammatory): Secondary | ICD-10-CM | POA: Diagnosis not present

## 2015-10-06 DIAGNOSIS — I4891 Unspecified atrial fibrillation: Secondary | ICD-10-CM | POA: Diagnosis present

## 2015-10-06 DIAGNOSIS — I428 Other cardiomyopathies: Secondary | ICD-10-CM | POA: Insufficient documentation

## 2015-10-06 DIAGNOSIS — I110 Hypertensive heart disease with heart failure: Secondary | ICD-10-CM | POA: Insufficient documentation

## 2015-10-06 DIAGNOSIS — I77810 Thoracic aortic ectasia: Secondary | ICD-10-CM | POA: Insufficient documentation

## 2015-10-06 DIAGNOSIS — G4733 Obstructive sleep apnea (adult) (pediatric): Secondary | ICD-10-CM | POA: Diagnosis not present

## 2015-10-06 DIAGNOSIS — I447 Left bundle-branch block, unspecified: Secondary | ICD-10-CM | POA: Insufficient documentation

## 2015-10-06 DIAGNOSIS — I480 Paroxysmal atrial fibrillation: Secondary | ICD-10-CM

## 2015-10-07 ENCOUNTER — Encounter: Payer: Self-pay | Admitting: Internal Medicine

## 2015-10-08 ENCOUNTER — Ambulatory Visit (HOSPITAL_COMMUNITY)
Admission: RE | Admit: 2015-10-08 | Discharge: 2015-10-08 | Disposition: A | Discharge status: Home / Self Care | Payer: BLUE CROSS/BLUE SHIELD | Source: Ambulatory Visit | Attending: Internal Medicine | Admitting: Internal Medicine

## 2015-10-08 ENCOUNTER — Telehealth: Payer: Self-pay | Admitting: Internal Medicine

## 2015-10-08 ENCOUNTER — Encounter (HOSPITAL_COMMUNITY): Payer: Self-pay

## 2015-10-08 DIAGNOSIS — R918 Other nonspecific abnormal finding of lung field: Secondary | ICD-10-CM | POA: Diagnosis not present

## 2015-10-08 DIAGNOSIS — I517 Cardiomegaly: Secondary | ICD-10-CM | POA: Insufficient documentation

## 2015-10-08 DIAGNOSIS — I480 Paroxysmal atrial fibrillation: Secondary | ICD-10-CM | POA: Insufficient documentation

## 2015-10-08 MED ORDER — IOPAMIDOL (ISOVUE-370) INJECTION 76%
INTRAVENOUS | Status: AC
  Administered 2015-10-08: 80 mL
  Filled 2015-10-08: qty 100

## 2015-10-08 MED ORDER — METOPROLOL TARTRATE 5 MG/5ML IV SOLN
5.0000 mg | Freq: Once | INTRAVENOUS | Status: AC
  Administered 2015-10-08: 5 mg via INTRAVENOUS
  Filled 2015-10-08: qty 5

## 2015-10-08 MED ORDER — METOPROLOL TARTRATE 5 MG/5ML IV SOLN
INTRAVENOUS | Status: AC
  Administered 2015-10-08: 5 mg via INTRAVENOUS
  Filled 2015-10-08: qty 5

## 2015-10-08 NOTE — Telephone Encounter (Signed)
New Message  Pt voiced he is wanting to know if he can or cannot take his medication prior to his surgery or to bring with him.  Please f/u with pt.

## 2015-10-08 NOTE — Telephone Encounter (Signed)
No medications the morning of the procedure.  Patient aware

## 2015-10-13 NOTE — Anesthesia Preprocedure Evaluation (Addendum)
Anesthesia Evaluation  Patient identified by MRN, date of birth, ID band Patient awake    Reviewed: Allergy & Precautions, H&P , NPO status , Patient's Chart, lab work & pertinent test results, reviewed documented beta blocker date and time   Airway Mallampati: III  TM Distance: >3 FB Neck ROM: Full    Dental no notable dental hx. (+) Teeth Intact, Dental Advisory Given   Pulmonary sleep apnea ,    Pulmonary exam normal breath sounds clear to auscultation       Cardiovascular hypertension, Pt. on medications and Pt. on home beta blockers +CHF  + dysrhythmias Atrial Fibrillation  Rhythm:Irregular Rate:Normal     Neuro/Psych Anxiety negative neurological ROS  negative psych ROS   GI/Hepatic Neg liver ROS, GERD  Medicated and Controlled,  Endo/Other  diabetes  Renal/GU negative Renal ROS  negative genitourinary   Musculoskeletal  (+) Arthritis , Osteoarthritis,    Abdominal   Peds  Hematology negative hematology ROS (+)   Anesthesia Other Findings   Reproductive/Obstetrics negative OB ROS                            Anesthesia Physical Anesthesia Plan  ASA: III  Anesthesia Plan: General   Post-op Pain Management:    Induction: Intravenous  Airway Management Planned: LMA  Additional Equipment:   Intra-op Plan:   Post-operative Plan: Extubation in OR  Informed Consent: I have reviewed the patients History and Physical, chart, labs and discussed the procedure including the risks, benefits and alternatives for the proposed anesthesia with the patient or authorized representative who has indicated his/her understanding and acceptance.   Dental advisory given  Plan Discussed with: CRNA  Anesthesia Plan Comments:         Anesthesia Quick Evaluation

## 2015-10-14 ENCOUNTER — Ambulatory Visit (HOSPITAL_COMMUNITY): Payer: BLUE CROSS/BLUE SHIELD | Admitting: Anesthesiology

## 2015-10-14 ENCOUNTER — Ambulatory Visit (HOSPITAL_COMMUNITY)
Admission: RE | Admit: 2015-10-14 | Discharge: 2015-10-15 | Disposition: A | Discharge status: Home / Self Care | Payer: BLUE CROSS/BLUE SHIELD | Source: Ambulatory Visit | Attending: Internal Medicine | Admitting: Internal Medicine

## 2015-10-14 ENCOUNTER — Encounter (HOSPITAL_COMMUNITY): Payer: Self-pay | Admitting: Certified Registered Nurse Anesthetist

## 2015-10-14 ENCOUNTER — Encounter (HOSPITAL_COMMUNITY)
Admission: RE | Disposition: A | Discharge status: Home / Self Care | Payer: Self-pay | Source: Ambulatory Visit | Attending: Internal Medicine

## 2015-10-14 DIAGNOSIS — I447 Left bundle-branch block, unspecified: Secondary | ICD-10-CM | POA: Diagnosis not present

## 2015-10-14 DIAGNOSIS — G4733 Obstructive sleep apnea (adult) (pediatric): Secondary | ICD-10-CM | POA: Insufficient documentation

## 2015-10-14 DIAGNOSIS — F17290 Nicotine dependence, other tobacco product, uncomplicated: Secondary | ICD-10-CM | POA: Diagnosis not present

## 2015-10-14 DIAGNOSIS — I480 Paroxysmal atrial fibrillation: Secondary | ICD-10-CM | POA: Insufficient documentation

## 2015-10-14 DIAGNOSIS — I429 Cardiomyopathy, unspecified: Secondary | ICD-10-CM | POA: Insufficient documentation

## 2015-10-14 DIAGNOSIS — I5022 Chronic systolic (congestive) heart failure: Secondary | ICD-10-CM | POA: Diagnosis not present

## 2015-10-14 DIAGNOSIS — Z833 Family history of diabetes mellitus: Secondary | ICD-10-CM | POA: Diagnosis not present

## 2015-10-14 DIAGNOSIS — K219 Gastro-esophageal reflux disease without esophagitis: Secondary | ICD-10-CM | POA: Diagnosis not present

## 2015-10-14 DIAGNOSIS — E119 Type 2 diabetes mellitus without complications: Secondary | ICD-10-CM | POA: Diagnosis not present

## 2015-10-14 DIAGNOSIS — Z8249 Family history of ischemic heart disease and other diseases of the circulatory system: Secondary | ICD-10-CM | POA: Diagnosis not present

## 2015-10-14 DIAGNOSIS — E669 Obesity, unspecified: Secondary | ICD-10-CM | POA: Insufficient documentation

## 2015-10-14 DIAGNOSIS — I481 Persistent atrial fibrillation: Secondary | ICD-10-CM | POA: Diagnosis not present

## 2015-10-14 DIAGNOSIS — M1990 Unspecified osteoarthritis, unspecified site: Secondary | ICD-10-CM | POA: Diagnosis not present

## 2015-10-14 DIAGNOSIS — Z7984 Long term (current) use of oral hypoglycemic drugs: Secondary | ICD-10-CM | POA: Insufficient documentation

## 2015-10-14 DIAGNOSIS — Z7901 Long term (current) use of anticoagulants: Secondary | ICD-10-CM | POA: Diagnosis not present

## 2015-10-14 DIAGNOSIS — I110 Hypertensive heart disease with heart failure: Secondary | ICD-10-CM | POA: Insufficient documentation

## 2015-10-14 DIAGNOSIS — I4891 Unspecified atrial fibrillation: Secondary | ICD-10-CM | POA: Diagnosis present

## 2015-10-14 DIAGNOSIS — Z6834 Body mass index (BMI) 34.0-34.9, adult: Secondary | ICD-10-CM | POA: Insufficient documentation

## 2015-10-14 HISTORY — PX: ELECTROPHYSIOLOGIC STUDY: SHX172A

## 2015-10-14 LAB — GLUCOSE, CAPILLARY
GLUCOSE-CAPILLARY: 194 mg/dL — AB (ref 65–99)
GLUCOSE-CAPILLARY: 166 mg/dL — AB (ref 65–99)
GLUCOSE-CAPILLARY: 163 mg/dL — AB (ref 65–99)
Glucose-Capillary: 157 mg/dL — ABNORMAL HIGH (ref 65–99)

## 2015-10-14 LAB — POCT ACTIVATED CLOTTING TIME
ACTIVATED CLOTTING TIME: 164 s
Activated Clotting Time: 213 seconds
Activated Clotting Time: 257 seconds
Activated Clotting Time: 274 seconds
Activated Clotting Time: 274 seconds
Activated Clotting Time: 197 seconds
Activated Clotting Time: 180 seconds

## 2015-10-14 LAB — MRSA PCR SCREENING: MRSA BY PCR: NEGATIVE

## 2015-10-14 SURGERY — ATRIAL FIBRILLATION ABLATION
Anesthesia: General

## 2015-10-14 MED ORDER — ISOPROTERENOL HCL 0.2 MG/ML IJ SOLN
INTRAVENOUS | Status: DC | PRN
  Administered 2015-10-14: 10 ug/min via INTRAVENOUS

## 2015-10-14 MED ORDER — SODIUM CHLORIDE 0.9% FLUSH
3.0000 mL | Freq: Two times a day (BID) | INTRAVENOUS | Status: DC
  Administered 2015-10-14: 3 mL via INTRAVENOUS

## 2015-10-14 MED ORDER — DOFETILIDE 500 MCG PO CAPS
500.0000 ug | ORAL_CAPSULE | Freq: Two times a day (BID) | ORAL | Status: DC
  Administered 2015-10-14 – 2015-10-15 (×2): 500 ug via ORAL
  Filled 2015-10-14 (×2): qty 1

## 2015-10-14 MED ORDER — SUCCINYLCHOLINE CHLORIDE 20 MG/ML IJ SOLN
INTRAMUSCULAR | Status: DC | PRN
  Administered 2015-10-14: 100 mg via INTRAVENOUS

## 2015-10-14 MED ORDER — LIDOCAINE HCL (PF) 1 % IJ SOLN: INTRAMUSCULAR | Status: DC | PRN

## 2015-10-14 MED ORDER — HEPARIN SODIUM (PORCINE) 1000 UNIT/ML IJ SOLN
INTRAMUSCULAR | Status: DC | PRN
  Administered 2015-10-14: 1000 [IU] via INTRAVENOUS
  Administered 2015-10-14: 12000 [IU] via INTRAVENOUS
  Administered 2015-10-14: 1000 [IU] via INTRAVENOUS

## 2015-10-14 MED ORDER — SUGAMMADEX SODIUM 200 MG/2ML IV SOLN
INTRAVENOUS | Status: DC | PRN
  Administered 2015-10-14: 500 mg via INTRAVENOUS

## 2015-10-14 MED ORDER — LOSARTAN POTASSIUM 50 MG PO TABS
50.0000 mg | ORAL_TABLET | Freq: Every day | ORAL | Status: DC
  Administered 2015-10-15: 50 mg via ORAL
  Filled 2015-10-14: qty 1

## 2015-10-14 MED ORDER — MIDAZOLAM HCL 5 MG/5ML IJ SOLN
INTRAMUSCULAR | Status: DC | PRN
  Administered 2015-10-14: 2 mg via INTRAVENOUS

## 2015-10-14 MED ORDER — HYDROMORPHONE HCL 1 MG/ML IJ SOLN: 0.2500 mg | INTRAMUSCULAR | Status: DC | PRN

## 2015-10-14 MED ORDER — SODIUM CHLORIDE 0.9% FLUSH: 3.0000 mL | INTRAVENOUS | Status: DC | PRN

## 2015-10-14 MED ORDER — LIDOCAINE HCL (CARDIAC) 20 MG/ML IV SOLN
INTRAVENOUS | Status: DC | PRN
  Administered 2015-10-14: 60 mg via INTRAVENOUS

## 2015-10-14 MED ORDER — BUPIVACAINE HCL (PF) 0.25 % IJ SOLN
INTRAMUSCULAR | Status: DC | PRN
  Administered 2015-10-14: 10 mL

## 2015-10-14 MED ORDER — IOPAMIDOL (ISOVUE-370) INJECTION 76%
INTRAVENOUS | Status: DC | PRN
  Administered 2015-10-14: 3 mL

## 2015-10-14 MED ORDER — HYDROCODONE-ACETAMINOPHEN 5-325 MG PO TABS
1.0000 | ORAL_TABLET | ORAL | Status: DC | PRN
Start: 2015-10-14 — End: 2015-10-15
  Administered 2015-10-14: 1 via ORAL
  Administered 2015-10-15: 2 via ORAL
  Filled 2015-10-14: qty 1
  Filled 2015-10-14: qty 2

## 2015-10-14 MED ORDER — ONDANSETRON HCL 4 MG/2ML IJ SOLN
INTRAMUSCULAR | Status: DC | PRN
  Administered 2015-10-14: 4 mg via INTRAVENOUS

## 2015-10-14 MED ORDER — SODIUM CHLORIDE 0.9 % IV SOLN
INTRAVENOUS | Status: DC
  Administered 2015-10-14 (×3): via INTRAVENOUS

## 2015-10-14 MED ORDER — FENTANYL CITRATE (PF) 100 MCG/2ML IJ SOLN
INTRAMUSCULAR | Status: DC | PRN
  Administered 2015-10-14: 50 ug via INTRAVENOUS
  Administered 2015-10-14: 100 ug via INTRAVENOUS
  Administered 2015-10-14: 50 ug via INTRAVENOUS

## 2015-10-14 MED ORDER — ONDANSETRON HCL 4 MG/2ML IJ SOLN: 4.0000 mg | Freq: Four times a day (QID) | INTRAMUSCULAR | Status: DC | PRN

## 2015-10-14 MED ORDER — CARVEDILOL 25 MG PO TABS
37.5000 mg | ORAL_TABLET | Freq: Two times a day (BID) | ORAL | Status: DC
  Administered 2015-10-14 – 2015-10-15 (×3): 37.5 mg via ORAL
  Filled 2015-10-14 (×3): qty 2

## 2015-10-14 MED ORDER — SODIUM CHLORIDE 0.9 % IV SOLN: 250.0000 mL | INTRAVENOUS | Status: DC | PRN

## 2015-10-14 MED ORDER — ROCURONIUM BROMIDE 100 MG/10ML IV SOLN
INTRAVENOUS | Status: DC | PRN
  Administered 2015-10-14: 60 mg via INTRAVENOUS

## 2015-10-14 MED ORDER — ACETAMINOPHEN 325 MG PO TABS: 650.0000 mg | ORAL_TABLET | ORAL | Status: DC | PRN

## 2015-10-14 MED ORDER — HEPARIN SODIUM (PORCINE) 1000 UNIT/ML IJ SOLN
INTRAMUSCULAR | Status: AC
  Filled 2015-10-14: qty 1

## 2015-10-14 MED ORDER — ALUM & MAG HYDROXIDE-SIMETH 200-200-20 MG/5ML PO SUSP
30.0000 mL | Freq: Four times a day (QID) | ORAL | Status: DC | PRN
  Administered 2015-10-15: 30 mL via ORAL
  Filled 2015-10-14: qty 30

## 2015-10-14 MED ORDER — BUPIVACAINE HCL (PF) 0.25 % IJ SOLN
INTRAMUSCULAR | Status: AC
  Filled 2015-10-14: qty 30

## 2015-10-14 MED ORDER — HEPARIN SODIUM (PORCINE) 1000 UNIT/ML IJ SOLN
INTRAMUSCULAR | Status: DC | PRN
  Administered 2015-10-14: 4 mL via INTRAVENOUS
  Administered 2015-10-14 (×3): 5 mL via INTRAVENOUS

## 2015-10-14 MED ORDER — GLIPIZIDE ER 5 MG PO TB24
5.0000 mg | ORAL_TABLET | Freq: Every day | ORAL | Status: DC
  Administered 2015-10-15: 5 mg via ORAL
  Filled 2015-10-14: qty 1

## 2015-10-14 MED ORDER — PROTAMINE SULFATE 10 MG/ML IV SOLN
INTRAVENOUS | Status: DC | PRN
  Administered 2015-10-14: 30 mg via INTRAVENOUS

## 2015-10-14 MED ORDER — IOPAMIDOL (ISOVUE-370) INJECTION 76%
INTRAVENOUS | Status: AC
  Filled 2015-10-14: qty 50

## 2015-10-14 MED ORDER — PROPOFOL 10 MG/ML IV BOLUS
INTRAVENOUS | Status: DC | PRN
  Administered 2015-10-14: 140 mg via INTRAVENOUS

## 2015-10-14 MED ORDER — SPIRONOLACTONE 25 MG PO TABS
25.0000 mg | ORAL_TABLET | Freq: Every day | ORAL | Status: DC
  Administered 2015-10-15: 25 mg via ORAL
  Filled 2015-10-14: qty 1

## 2015-10-14 MED ORDER — PHENYLEPHRINE HCL 10 MG/ML IJ SOLN
INTRAMUSCULAR | Status: DC | PRN
  Administered 2015-10-14 (×2): 80 ug via INTRAVENOUS

## 2015-10-14 MED ORDER — ISOPROTERENOL HCL 0.2 MG/ML IJ SOLN
INTRAMUSCULAR | Status: AC
  Filled 2015-10-14: qty 5

## 2015-10-14 MED ORDER — RIVAROXABAN 20 MG PO TABS
20.0000 mg | ORAL_TABLET | Freq: Every day | ORAL | Status: DC
  Administered 2015-10-14: 20 mg via ORAL
  Filled 2015-10-14: qty 1

## 2015-10-14 SURGICAL SUPPLY — 19 items
BAG SNAP BAND KOVER 36X36 (MISCELLANEOUS) ×2 IMPLANT
BLANKET WARM UNDERBOD FULL ACC (MISCELLANEOUS) ×2 IMPLANT
CATH NAVISTAR SMARTTOUCH DF (ABLATOR) ×2 IMPLANT
CATH SOUNDSTAR 3D IMAGING (CATHETERS) ×2 IMPLANT
CATH VARIABLE LASSO NAV 2515 (CATHETERS) ×2 IMPLANT
CATH WEBSTER BI DIR CS D-F CRV (CATHETERS) ×2 IMPLANT
COVER SWIFTLINK CONNECTOR (BAG) ×2 IMPLANT
HOVERMATT SINGLE USE (MISCELLANEOUS) ×2 IMPLANT
NEEDLE TRANSEP BRK 71CM 407200 (NEEDLE) ×2 IMPLANT
PACK EP LATEX FREE (CUSTOM PROCEDURE TRAY) ×1
PACK EP LF (CUSTOM PROCEDURE TRAY) ×1 IMPLANT
PAD DEFIB LIFELINK (PAD) ×2 IMPLANT
PATCH CARTO3 (PAD) ×2 IMPLANT
SHEATH AVANTI 11F 11CM (SHEATH) ×2 IMPLANT
SHEATH PINNACLE 7F 10CM (SHEATH) ×4 IMPLANT
SHEATH PINNACLE 9F 10CM (SHEATH) ×2 IMPLANT
SHEATH SWARTZ TS SL2 63CM 8.5F (SHEATH) ×2 IMPLANT
SHIELD RADPAD SCOOP 12X17 (MISCELLANEOUS) ×2 IMPLANT
TUBING SMART ABLATE COOLFLOW (TUBING) ×2 IMPLANT

## 2015-10-14 NOTE — Interval H&P Note (Signed)
History and Physical Interval Note:  10/14/2015 7:21 AM  Aaron Thompson  has presented today for surgery, with the diagnosis of afib  The various methods of treatment have been discussed with the patient and family. After consideration of risks, benefits and other options for treatment, the patient has consented to  Procedure(s): Atrial Fibrillation Ablation ([name]) as a surgical intervention .  The patient's history has been reviewed, patient examined, no change in status, stable for surgery.  I have reviewed the patient's chart and labs.  Questions were answered to the patient's satisfaction.     [name]

## 2015-10-14 NOTE — Anesthesia Postprocedure Evaluation (Signed)
Anesthesia Post Note  Patient: Aaron Thompson  Procedure(s) Performed: Procedure(s) (LRB): Atrial Fibrillation Ablation ([name])  Patient location during evaluation: PACU Anesthesia Type: General Level of consciousness: awake and alert Pain management: pain level controlled Vital Signs Assessment: post-procedure vital signs reviewed and stable Respiratory status: spontaneous breathing, nonlabored ventilation and respiratory function stable Cardiovascular status: blood pressure returned to baseline and stable Postop Assessment: no signs of nausea or vomiting Anesthetic complications: no    Last Vitals:  Vitals:   10/14/15 1345 10/14/15 1350  BP: (!) 154/89 (!) 143/92  Pulse: 98 95  Resp: 18 18  Temp:      Last Pain:  Vitals:   10/14/15 1141  TempSrc: Temporal                 Thadd Apuzzo,W. EDMOND

## 2015-10-14 NOTE — Anesthesia Procedure Notes (Signed)
Procedure Name: Intubation Date/Time: 10/14/2015 8:01 AM Performed by: Ollen Bowl Pre-anesthesia Checklist: Patient identified, Emergency Drugs available, Suction available, Patient being monitored and Timeout performed Patient Re-evaluated:Patient Re-evaluated prior to inductionOxygen Delivery Method: Circle system utilized and Simple face mask Preoxygenation: Pre-oxygenation with 100% oxygen Intubation Type: IV induction Ventilation: Mask ventilation without difficulty and Two handed mask ventilation required Laryngoscope Size: Miller and 3 Grade View: Grade I Tube type: Oral Tube size: 7.5 mm Number of attempts: 1 Airway Equipment and Method: Patient positioned with wedge pillow and Stylet Placement Confirmation: ETT inserted through vocal cords under direct vision,  positive ETCO2 and breath sounds checked- equal and bilateral Secured at: 23 cm Tube secured with: Tape Dental Injury: Teeth and Oropharynx as per pre-operative assessment

## 2015-10-14 NOTE — Transfer of Care (Signed)
Immediate Anesthesia Transfer of Care Note  Patient: Aaron Thompson  Procedure(s) Performed: Procedure(s): Atrial Fibrillation Ablation ([name])  Patient Location: PACU and Cath Lab  Anesthesia Type:General  Level of Consciousness: awake, alert  and oriented  Airway & Oxygen Therapy: Patient Spontanous Breathing and Patient connected to nasal cannula oxygen  Post-op Assessment: Report given to RN, Post -op Vital signs reviewed and stable and Patient moving all extremities X 4  Post vital signs: Reviewed and stable  Last Vitals:  Vitals:   10/14/15 1141 10/14/15 1145  BP:  140/88  Pulse:  (!) 102  Resp:  19  Temp: 36.3 C     Last Pain:  Vitals:   10/14/15 1141  TempSrc: Temporal         Complications: No apparent anesthesia complications

## 2015-10-14 NOTE — H&P (View-Only) (Signed)
Electrophysiology Office Note   Date:  10/01/2015   ID:  Aaron Thompson, DOB March 19, 1960, MRN [id number]  PCP:  [name], MD  Cardiologist:  Dr [name] Primary Electrophysiologist: Dr Caryl Comes  Chief Complaint  Patient presents with  . Atrial Fibrillation     History of Present Illness: Aaron Thompson is a 55 y.o. male who presents today for electrophysiology evaluation.   The patient has struggled with symptomatic atrial arrhythmias.  He has failed medical therapy with tikosyn.  He has had multiple prior cardioversions.  He also has OSA, obesity, and a nonischemic CM.  He has not had an echo since 3/16.  He is quite symptomatic with his afib.  He has also had inappropriate ICD shocks related to his afib.  Today, he denies symptoms of palpitations, chest pain, shortness of breath, orthopnea, PND, lower extremity edema, claudication, dizziness, presyncope, syncope, bleeding, or neurologic sequela. The patient is tolerating medications without difficulties and is otherwise without complaint today.    Past Medical History:  Diagnosis Date  . Anxiety state, unspecified   . Calculus of kidney 1981   "passed it on my own"  . Chronic systolic CHF (congestive heart failure) ([place])   . Dermatophytosis of the body   . DJD (degenerative joint disease)   . Esophageal reflux   . Hx of colonic polyps   . Hypertension   . LBBB (left bundle branch block)    intermittent  . Non-ischemic cardiomyopathy ([place])    a. cath 2013: minor nonobstructive CAD.  . OSA on CPAP   . Osteoarthrosis, unspecified whether generalized or localized, hand   . Other chronic nonalcoholic liver disease    fatty liver  . PAF (paroxysmal atrial fibrillation) ([place])    a. s/p DCCV 6/11; previously on Pradaxa;  b. Event Monitor 2012->No PAF;  c. 09/2011 s/p DCCV ->Xarelto started. d. 03/2014: inappropriate ICD shocks for AF-RVR, started on Tikosyn, Xarelto restarted.  . Type II diabetes mellitus ([place])   . Unspecified  hearing loss    Past Surgical History:  Procedure Laterality Date  . Abdominal US  01/2007   Fatty liver, no gallstones  . APPENDECTOMY    . BI-VENTRICULAR IMPLANTABLE CARDIOVERTER DEFIBRILLATOR [name] 02/14/2013   STJ CRTD implanted by Dr Caryl Comes  . BILATERAL KNEE ARTHROSCOPY    . CARDIOVERSION  09/29/2011   Procedure: CARDIOVERSION;  Surgeon: Thayer Headings, MD;  Location: [facility];  Service: Cardiovascular;  Laterality: [name];  . DOPPLER ECHOCARDIOGRAPHY  11/2009   Decreased EF of 35-40%  . LEFT HEART CATHETERIZATION WITH CORONARY ANGIOGRAM [name] 08/18/2011   Procedure: LEFT HEART CATHETERIZATION WITH CORONARY ANGIOGRAM;  Surgeon: Peter M Martinique, MD;  Location: [facility] CATH LAB;  Service: Cardiovascular;  Laterality: [name];  . NECK SURGERY     plate/fusion  . Stress Cardiolite  08/2005   Normal, EF 42%  . UPPER GASTROINTESTINAL ENDOSCOPY  08/2006   GERD     Current Outpatient Prescriptions  Medication Sig Dispense Refill  . carvedilol (COREG) 25 MG tablet Take 2 tablets (50 mg total) by mouth 2 (two) times daily with a meal. TAKE 1 AND 1/2 TABLETS (37.5 MG TOTAL) BY MOUTH 2 (TWO) TIMES DAILY WITH A MEAL. 120 tablet 6  . dofetilide (TIKOSYN) 500 MCG capsule Take 1 capsule (500 mcg total) by mouth 2 (two) times daily. 60 capsule 6  . glipiZIDE (GLUCOTROL XL) 5 MG 24 hr tablet Take 1 tablet (5 mg total) by mouth daily with breakfast. 30 tablet 11  .  glucose blood (ONE TOUCH ULTRA TEST) test strip CHECK GLUCOSE TWICE DAILY AND AS NEEDED, DX. E11.65 (UNCONTROLLED DM) 100 each 5  . losartan (COZAAR) 50 MG tablet Take 1 tablet (50 mg total) by mouth daily. 30 tablet 5  . metFORMIN (GLUCOPHAGE) 500 MG tablet TAKE 1 TABLET BY MOUTH TWICE DAILY WITH A MEAL 60 tablet 11  . Multiple Vitamin (MULTIVITAMIN) tablet Take 1 tablet by mouth daily.      [name] Bicarbonate (ZEGERID OTC) 20-1100 MG CAPS Take 1 capsule by mouth daily.     . Potassium Chloride ER 20 MEQ TBCR Take 2 tablets by mouth in the AM &  1/2 tablet by mouth in the PM  11  . ranolazine (RANEXA) 500 MG 12 hr tablet Take 1 tablet (500 mg total) by mouth 2 (two) times daily. 60 tablet 6  . spironolactone (ALDACTONE) 25 MG tablet Take 1 tablet (25 mg total) by mouth daily. 30 tablet 8  . XARELTO 20 MG TABS tablet TAKE 1 TABLET BY MOUTH ONCE DAILY WITH SUPPER 30 tablet 2  . albuterol (PROVENTIL HFA;VENTOLIN HFA) 108 (90 Base) MCG/ACT inhaler Inhale 2 puffs into the lungs every 4 (four) hours as needed for wheezing or shortness of breath. (Patient not taking: Reported on 10/01/2015) 1 Inhaler 1  . benzonatate (TESSALON) 200 MG capsule Take 1 capsule (200 mg total) by mouth 3 (three) times daily as needed for cough (swallow whole do not bite pill). (Patient not taking: Reported on 10/01/2015) 30 capsule 1   No current facility-administered medications for this visit.     Allergies:   Review of patient's allergies indicates no known allergies.   Social History:  The patient  reports that he has never smoked. He has quit using smokeless tobacco. His smokeless tobacco use included Snuff. He reports that he does not drink alcohol or use drugs.   Family History:  The patient's family history includes Diabetes in his brother, brother, father, and mother; Heart attack in his paternal grandfather; Heart failure in his father and mother; Hypertension in his father and mother; Kidney disease in his brother; Leukemia in his sister; Multiple myeloma in his sister; Other in his sister; Stroke in his paternal grandmother.    ROS:  Please see the history of present illness.   All other systems are reviewed and negative.    PHYSICAL EXAM: VS:  BP 116/78   Pulse 74   Ht '6\' 4"'  (1.93 m)   Wt 290 lb 9.6 oz (131.8 kg)   SpO2 96%   BMI 35.37 kg/m  , BMI Body mass index is 35.37 kg/m. GEN: Well nourished, well developed, in no acute distress  HEENT: normal  Neck: no JVD, carotid bruits, or masses Cardiac: RRR; no murmurs, rubs, or gallops,no edema    Respiratory:  clear to auscultation bilaterally, normal work of breathing GI: soft, nontender, nondistended, + BS MS: no deformity or atrophy  Skin: warm and dry, device pocket is well healed Neuro:  Strength and sensation are intact Psych: euthymic mood, full affect  EKG:  EKG is ordered today. The ekg ordered today shows sinus with BiV pacing, Qtc 531 msec  Device interrogation is reviewed today in detail.  See PaceArt for details.   Recent Labs: 04/07/2015: ALT 23 07/29/2015: B Natriuretic Peptide 24.9; TSH 2.041 08/13/2015: Hemoglobin 14.0; Platelets 155 08/28/2015: BUN 17; Creat 0.91; Magnesium 2.0; Potassium 4.5; Sodium 135    Lipid Panel     Component Value Date/Time   CHOL  149 04/07/2015 0834   TRIG 129.0 04/07/2015 0834   HDL 29.70 (L) 04/07/2015 0834   CHOLHDL 5 04/07/2015 0834   VLDL 25.8 04/07/2015 0834   LDLCALC 94 04/07/2015 0834   LDLDIRECT 109.1 02/01/2013 1516     Wt Readings from Last 3 Encounters:  10/01/15 290 lb 9.6 oz (131.8 kg)  09/22/15 287 lb 12.8 oz (130.5 kg)  08/28/15 291 lb (132 kg)      Other studies Reviewed: Additional studies/ records that were reviewed today include: hospital records, prior echo, Dr Aquilla Hacker notes, Dr Forde Dandy consult note, Dr Mariana Arn recent note, and Carolan Clines ntoes  Review of the above records today demonstrates: as above   ASSESSMENT AND PLAN:  1.  Persistent atrial fibrillation The patient has symptomatic atrial arrhythmias including afib and (per Dr Mariana Arn notes atach).  He has failed medical therapy with tikosyn.  Therapeutic strategies for afib including medicine and ablation were discussed in detail with the patient today. Risk, benefits, and alternatives to EP study and radiofrequency ablation were also discussed in detail today. These risks include but are not limited to stroke, bleeding, vascular damage, tamponade, perforation, damage to the esophagus, lungs, and other structures, pulmonary vein stenosis,  worsening renal function, ICD lead dislodgement, and death. The patient understands these risk and wishes to proceed.  We will therefore proceed with catheter ablation at the next available time.  Will obtain 2d echo to evaluate for structural changes related to afib.  Will anticipate cardiac CT within 1 week of ablation to evaluate LA appendage for thrombus.  No changes today  2. Nonischemic CM Device interrogation is reviewed today and normal No changes See paceart   Current medicines are reviewed at length with the patient today.   The patient does not have concerns regarding his medicines.  The following changes were made today:  none    Signed, [name], MD  10/01/2015 11:29 AM     [facility] [address] [place] [address] [phone] (office) [phone] (fax)

## 2015-10-14 NOTE — Progress Notes (Signed)
7Fr, 9Fr, 11Fr sheaths removed from Right Femoral Vein; Manual Pressure held for 25 minutes; VS remained stable throughout hold; Vascular site level 0; gauze and transparent occlusive dressing applied; post sheath removal education given to patient with teachback; bedrest started at 1430.

## 2015-10-15 ENCOUNTER — Other Ambulatory Visit: Payer: Self-pay

## 2015-10-15 ENCOUNTER — Encounter (HOSPITAL_COMMUNITY): Payer: Self-pay | Admitting: Internal Medicine

## 2015-10-15 DIAGNOSIS — I5022 Chronic systolic (congestive) heart failure: Secondary | ICD-10-CM | POA: Diagnosis not present

## 2015-10-15 DIAGNOSIS — E669 Obesity, unspecified: Secondary | ICD-10-CM | POA: Diagnosis not present

## 2015-10-15 DIAGNOSIS — I480 Paroxysmal atrial fibrillation: Secondary | ICD-10-CM | POA: Diagnosis not present

## 2015-10-15 DIAGNOSIS — G4733 Obstructive sleep apnea (adult) (pediatric): Secondary | ICD-10-CM | POA: Diagnosis not present

## 2015-10-15 LAB — BASIC METABOLIC PANEL
ANION GAP: 8 (ref 5–15)
BUN: 11 mg/dL (ref 6–20)
CO2: 26 mmol/L (ref 22–32)
Calcium: 8.7 mg/dL — ABNORMAL LOW (ref 8.9–10.3)
Chloride: 101 mmol/L (ref 101–111)
Creatinine, Ser: 0.78 mg/dL (ref 0.61–1.24)
Glucose, Bld: 188 mg/dL — ABNORMAL HIGH (ref 65–99)
Potassium: 3.3 mmol/L — ABNORMAL LOW (ref 3.5–5.1)
Sodium: 135 mmol/L (ref 135–145)

## 2015-10-15 LAB — GLUCOSE, CAPILLARY: GLUCOSE-CAPILLARY: 219 mg/dL — AB (ref 65–99)

## 2015-10-15 LAB — MAGNESIUM: MAGNESIUM: 1.7 mg/dL (ref 1.7–2.4)

## 2015-10-15 MED ORDER — IBUPROFEN 200 MG PO TABS
600.0000 mg | ORAL_TABLET | Freq: Three times a day (TID) | ORAL | Status: DC
  Administered 2015-10-15 (×2): 600 mg via ORAL
  Filled 2015-10-15 (×2): qty 3

## 2015-10-15 MED ORDER — MORPHINE SULFATE (PF) 2 MG/ML IV SOLN
2.0000 mg | Freq: Once | INTRAVENOUS | Status: AC
Start: 2015-10-15 — End: 2015-10-15
  Administered 2015-10-15: 2 mg via INTRAVENOUS
  Filled 2015-10-15: qty 1

## 2015-10-15 MED ORDER — NITROGLYCERIN 0.4 MG SL SUBL
SUBLINGUAL_TABLET | SUBLINGUAL | Status: AC
  Administered 2015-10-15: 0.4 mg
  Filled 2015-10-15: qty 1

## 2015-10-15 MED ORDER — COLCHICINE 0.6 MG PO TABS
0.6000 mg | ORAL_TABLET | Freq: Two times a day (BID) | ORAL | Status: DC
  Administered 2015-10-15: 0.6 mg via ORAL
  Filled 2015-10-15 (×2): qty 1

## 2015-10-15 NOTE — Significant Event (Signed)
Evaluated patient due to complaints of chest pain.  He reported sharp chest pain this morning.  Patient underwent an atrial fibrillation ablation today.  Examination was normal.  ECG was without ischemia/infarction.  Gave IV morphine.  Patient may be suffering from pericarditis.  Will start colchicine and ibuprofen for now.    Liborio Nixon

## 2015-10-15 NOTE — Progress Notes (Signed)
Patient had 11 beats of v tach.  Patient has pain that has recently been treated.  He was asymptomatic to the vtach.

## 2015-10-15 NOTE — Discharge Instructions (Signed)
You have an appointment set up with the [facility].  Multiple studies have shown that being followed by a dedicated atrial fibrillation clinic in addition to the standard care you receive from your other physicians improves health. We believe that enrollment in the atrial fibrillation clinic will allow [place] to better care for you.   The phone number to the [facility] is [phone]. The clinic is staffed Monday through Friday from 8:30am to 5pm.  Parking Directions: The clinic is located in the Heart and Vascular Building connected to [facility]. 1)From [address] turn on to [address] and go to the 3rd entrance  (Heart and Vascular entrance) on the right. 2)Look to the right for Heart &Vascular Parking Garage. 3)A code for the entrance is required please call the clinic to receive this.   4)Take the elevators to the 1st floor. Registration is in the room with the glass walls at the end of the hallway.  If you have any trouble parking or locating the clinic, please dont hesitate to call [phone].  No driving for 4 days. No lifting over 5 lbs for 1 week. No sexual activity for 1 week. You may return to work in 1 week. Keep procedure site clean & dry. If you notice increased pain, swelling, bleeding or pus, call/return!  You may shower, but no soaking baths/hot tubs/pools for 1 week.

## 2015-10-15 NOTE — Progress Notes (Signed)
Pt D/C home. Reviewed D/C instructions and medications with the pt.

## 2015-10-15 NOTE — Discharge Summary (Signed)
ELECTROPHYSIOLOGY PROCEDURE DISCHARGE SUMMARY    Patient ID: Aaron Thompson,  MRN: [id number], DOB/AGE: 1960-08-30 55 y.o.  Admit date: 10/14/2015 Discharge date: 10/15/2015  Primary Care Physician: [name], MD Primary Cardiologist: [name] Electrophysiologist: Thomas Hoff, MD  Primary Discharge Diagnosis:  Persistent atrial fibrillation s/p ablation this admission  Secondary Discharge Diagnosis:  1.  OSA 2.  Obesity 3.  NICM s/p STJ CRTD 4.  LBBB 5.  Chronic systolic heart failure 6.  Diabetes  Procedures This Admission:  1.  Electrophysiology study and radiofrequency catheter ablation on 10/14/15 by Dr [name].  This study demonstrated sinus rhythm upon presentation; intracardiac echo reveals a moderate sized left atrium with four separate pulmonary veins without evidence of pulmonary vein stenosis; successful electrical isolation and anatomical encircling of all four pulmonary veins with radiofrequency current; no inducible arrhythmias following ablation both on and off of Isuprel; no early apparent complications.  Brief HPI: DRAGO MCKENDRICK is a 55 y.o. male with a history of persistent atrial fibrillation.  They have failed medical therapy with tikosyn. Risks, benefits, and alternatives to catheter ablation of atrial fibrillation were reviewed with the patient who wished to proceed.  The patient underwent cardiac CT prior to the procedure which demonstrated normal LV function and no LAA thrombus.    Hospital Course:  The patient was admitted and underwent EPS/RFCA of atrial fibrillation with details as outlined above.  They were monitored on telemetry overnight which demonstrated sinus rhythm with ventricular pacing.  Groin was without complication on the day of discharge. He had some chest pain overnight worse with inspiration.  Bedside echo demonstrated no effusion. The patient was examined and considered to be stable for discharge.  Wound care and restrictions  were reviewed with the patient.  The patient will be seen back by the AF clinic in 4 weeks and Dr Rayann Heman in 12 weeks for post ablation follow up.   This patients CHA2DS2-VASc Score and unadjusted Ischemic Stroke Rate (% per year) is equal to 3.2 % stroke rate/year from a score of 3 Above score calculated as 1 point each if present [CHF, HTN, DM, Vascular=MI/PAD/Aortic Plaque, Age if 65-74, or Male] Above score calculated as 2 points each if present [Age > 75, or Stroke/TIA/TE]   Physical Exam: Vitals:   10/14/15 2200 10/14/15 2300 10/15/15 0138 10/15/15 0500  BP: 127/89 (!) 150/90 117/80 117/77  Pulse: 100 (!) 110  (!) 108  Resp: 15 (!) 35 19 (!) 21  Temp:  98.3 F (36.8 C)  97.8 F (36.6 C)  TempSrc:  Oral  Oral  SpO2: 95% 96% 96% 95%  Weight:      Height:        GEN- The patient is well appearing, alert and oriented x 3 today.   HEENT: normocephalic, atraumatic; sclera clear, conjunctiva pink; hearing intact; oropharynx clear; neck supple  Lungs- Clear to ausculation bilaterally, normal work of breathing.  No wheezes, rales, rhonchi Heart- Regular rate and rhythm, no murmurs, rubs or gallops  GI- soft, non-tender, non-distended, bowel sounds present  Extremities- no clubbing, cyanosis, or edema; DP/PT/radial pulses 2+ bilaterally, groin without hematoma/bruit MS- no significant deformity or atrophy Skin- warm and dry, no rash or lesion Psych- euthymic mood, full affect Neuro- strength and sensation are intact   Labs:   Lab Results  Component Value Date   WBC 8.7 10/01/2015   HGB 14.6 10/01/2015   HCT 42.8 10/01/2015   MCV 85.6 10/01/2015   PLT 191 10/01/2015  Recent Labs Lab 10/15/15 0215  NA 135  K 3.3*  CL 101  CO2 26  BUN 11  CREATININE 0.78  CALCIUM 8.7*  GLUCOSE 188*     Discharge Medications:    Medication List    TAKE these medications   carvedilol 25 MG tablet Commonly known as:  COREG Take 2 tablets (50 mg total) by mouth 2 (two) times  daily with a meal. TAKE 1 AND 1/2 TABLETS (37.5 MG TOTAL) BY MOUTH 2 (TWO) TIMES DAILY WITH A MEAL. What changed:  additional instructions   dofetilide 500 MCG capsule Commonly known as:  TIKOSYN Take 1 capsule (500 mcg total) by mouth 2 (two) times daily.   glipiZIDE 5 MG 24 hr tablet Commonly known as:  GLUCOTROL XL Take 1 tablet (5 mg total) by mouth daily with breakfast.   glucose blood test strip Commonly known as:  ONE TOUCH ULTRA TEST CHECK GLUCOSE TWICE DAILY AND AS NEEDED, DX. E11.65 (UNCONTROLLED DM)   loratadine 10 MG tablet Commonly known as:  CLARITIN Take 10 mg by mouth daily as needed for allergies.   losartan 50 MG tablet Commonly known as:  COZAAR Take 1 tablet (50 mg total) by mouth daily.   metFORMIN 500 MG tablet Commonly known as:  GLUCOPHAGE TAKE 1 TABLET BY MOUTH TWICE DAILY WITH A MEAL   multivitamin tablet Take 1 tablet by mouth daily.   Potassium Chloride ER 20 MEQ Tbcr Take 2 tablets by mouth in the AM & 1/2 tablet by mouth in the PM   ranolazine 500 MG 12 hr tablet Commonly known as:  RANEXA Take 1 tablet (500 mg total) by mouth 2 (two) times daily.   spironolactone 25 MG tablet Commonly known as:  ALDACTONE Take 1 tablet (25 mg total) by mouth daily.   XARELTO 20 MG Tabs tablet Generic drug:  rivaroxaban TAKE 1 TABLET BY MOUTH ONCE DAILY WITH SUPPER   ZEGERID OTC 20-1100 MG Caps capsule Generic drug:  Omeprazole-Sodium Bicarbonate Take 1 capsule by mouth 2 (two) times daily.       Disposition:  Discharge Instructions    Diet - low sodium heart healthy    Complete by:  As directed    Increase activity slowly    Complete by:  As directed      Follow-up Information    [facility] [place] Follow up on 11/06/2015.   Specialty:  Cardiology Why:  at 2:30PM  Contact information: [address] [id number] [place] [id number] [place], [place] Follow up on 01/14/2016.    Specialty:  Cardiology Why:  at [facility] information: [place] [address] [phone]           Duration of Discharge Encounter: Greater than 30 minutes including physician time.  Signed, [name], NP 10/15/2015 7:10 AM  I have seen, examined the patient, and reviewed the above assessment and plan.  Changes to above are made where necessary.  On exam, RRR.  Limited echo by me this am reveals no effusion. DC to home on current home medicines.  Co Sign: [name], MD 10/15/2015 5:13 PM

## 2015-10-15 NOTE — Progress Notes (Signed)
Patient has complained of 5/10 sharp mid chest pain radiating to his jaw (which is "achy").  Patient was given 1 nitroglycerin sl with temporary decrease in pain, but refused additional doses of the med.  The attending has been made aware.  Additionally, the patient was treated with pain medication and maalox in effort to treat throat pain prior to this event, but without much relief.  Will continue to treat/monitor

## 2015-10-19 ENCOUNTER — Encounter (HOSPITAL_BASED_OUTPATIENT_CLINIC_OR_DEPARTMENT_OTHER): Payer: BLUE CROSS/BLUE SHIELD

## 2015-10-20 ENCOUNTER — Telehealth: Payer: Self-pay | Admitting: Internal Medicine

## 2015-10-20 NOTE — Telephone Encounter (Signed)
New message   Pt verbalized that he is calling for the rn to ask when will he be able to play golf

## 2015-10-20 NOTE — Telephone Encounter (Signed)
One week post procedure able to play golf

## 2015-10-21 ENCOUNTER — Telehealth: Payer: Self-pay | Admitting: Cardiology

## 2015-10-21 NOTE — Telephone Encounter (Signed)
Spoke w/ pt and requested that he send a manual transmission b/c his home monitor has not updated in at least 8 days.   

## 2015-10-28 ENCOUNTER — Encounter: Payer: BLUE CROSS/BLUE SHIELD | Admitting: Internal Medicine

## 2015-10-30 ENCOUNTER — Ambulatory Visit (INDEPENDENT_AMBULATORY_CARE_PROVIDER_SITE_OTHER): Payer: BLUE CROSS/BLUE SHIELD | Admitting: *Deleted

## 2015-10-30 ENCOUNTER — Telehealth: Payer: Self-pay | Admitting: Cardiology

## 2015-10-30 DIAGNOSIS — I428 Other cardiomyopathies: Secondary | ICD-10-CM

## 2015-10-30 DIAGNOSIS — I5022 Chronic systolic (congestive) heart failure: Secondary | ICD-10-CM

## 2015-10-30 NOTE — Telephone Encounter (Signed)
Spoke with pt and reminded pt of remote transmission that is due today. Pt verbalized understanding.   

## 2015-10-31 ENCOUNTER — Encounter: Payer: Self-pay | Admitting: Cardiology

## 2015-10-31 NOTE — Progress Notes (Signed)
Remote ICD transmission.   

## 2015-11-06 ENCOUNTER — Ambulatory Visit (HOSPITAL_COMMUNITY)
Admission: RE | Admit: 2015-11-06 | Discharge: 2015-11-06 | Disposition: A | Discharge status: Home / Self Care | Payer: BLUE CROSS/BLUE SHIELD | Source: Ambulatory Visit | Attending: Nurse Practitioner | Admitting: Nurse Practitioner

## 2015-11-06 ENCOUNTER — Encounter (HOSPITAL_COMMUNITY): Payer: Self-pay | Admitting: Nurse Practitioner

## 2015-11-06 DIAGNOSIS — I480 Paroxysmal atrial fibrillation: Secondary | ICD-10-CM

## 2015-11-06 DIAGNOSIS — G4733 Obstructive sleep apnea (adult) (pediatric): Secondary | ICD-10-CM | POA: Insufficient documentation

## 2015-11-06 DIAGNOSIS — I5022 Chronic systolic (congestive) heart failure: Secondary | ICD-10-CM | POA: Diagnosis not present

## 2015-11-06 DIAGNOSIS — Z6835 Body mass index (BMI) 35.0-35.9, adult: Secondary | ICD-10-CM | POA: Diagnosis not present

## 2015-11-06 DIAGNOSIS — R000 Tachycardia, unspecified: Secondary | ICD-10-CM | POA: Diagnosis not present

## 2015-11-06 DIAGNOSIS — Z79899 Other long term (current) drug therapy: Secondary | ICD-10-CM | POA: Diagnosis not present

## 2015-11-06 DIAGNOSIS — Z7984 Long term (current) use of oral hypoglycemic drugs: Secondary | ICD-10-CM | POA: Diagnosis not present

## 2015-11-06 DIAGNOSIS — Z7901 Long term (current) use of anticoagulants: Secondary | ICD-10-CM | POA: Diagnosis not present

## 2015-11-06 DIAGNOSIS — I110 Hypertensive heart disease with heart failure: Secondary | ICD-10-CM | POA: Diagnosis not present

## 2015-11-06 DIAGNOSIS — I10 Essential (primary) hypertension: Secondary | ICD-10-CM

## 2015-11-06 NOTE — Progress Notes (Signed)
Primary Care Physician: [name], MD Primary Cardiologist: [name] Primary Electrophysiologist: Norton Blizzard is a 55 y.o. male with a history of paroxysmal atrial fibrillation who presents for follow up in the [facility]. Since ablation, he reports doing very well. He has not had recurrent AF by symptoms or device interrogation today. He has had a feeling of discomfort in his throat that comes and goes, is worse in the mornings and better when he eats.  He has increased energy and exercise tolerance since ablation.   Today, he  denies symptoms of palpitations, chest pain, shortness of breath, orthopnea, PND, lower extremity edema, dizziness, presyncope, syncope, snoring, daytime somnolence, bleeding, or neurologic sequela. The patient is tolerating medications without difficulties and is otherwise without complaint today.    Atrial Fibrillation Risk Factors:  he does have symptoms or diagnosis of sleep apnea. he is compliant with CPAP therapy.  he does not have a history of rheumatic fever.  he does not have a history of alcohol use.  he has a BMI of Body mass index is 35.96 kg/m.[name] Filed Weights   11/06/15 1423  Weight: 295 lb 6.4 oz (134 kg)    LA size: 39   Atrial Fibrillation Management history:  Previous antiarrhythmic drugs: Tikosyn  Previous cardioversions: 09/2011; previous inappropriate shocks for AF with RVR  Previous ablations: 09/2015  CHADS2VASC score: 3  Anticoagulation history: Xarelto   Past Medical History:  Diagnosis Date  . Anxiety state, unspecified   . Calculus of kidney 1981   "passed it on my own"  . Chronic systolic CHF (congestive heart failure) ([place])   . Dermatophytosis of the body   . DJD (degenerative joint disease)   . Esophageal reflux   . Hx of colonic polyps   . Hypertension   . LBBB (left bundle branch block)    intermittent  . Non-ischemic cardiomyopathy ([place])    a. cath 2013: minor  nonobstructive CAD.  . OSA on CPAP   . Osteoarthrosis, unspecified whether generalized or localized, hand   . Other chronic nonalcoholic liver disease    fatty liver  . PAF (paroxysmal atrial fibrillation) ([place])    a. s/p DCCV 6/11; previously on Pradaxa;  b. Event Monitor 2012->No PAF;  c. 09/2011 s/p DCCV ->Xarelto started. d. 03/2014: inappropriate ICD shocks for AF-RVR, started on Tikosyn, Xarelto restarted.  . Type II diabetes mellitus ([place])   . Unspecified hearing loss    Past Surgical History:  Procedure Laterality Date  . Abdominal US  01/2007   Fatty liver, no gallstones  . APPENDECTOMY    . BI-VENTRICULAR IMPLANTABLE CARDIOVERTER DEFIBRILLATOR [name] 02/14/2013   STJ CRTD implanted by Dr Caryl Comes  . BILATERAL KNEE ARTHROSCOPY    . CARDIOVERSION  09/29/2011   Procedure: CARDIOVERSION;  Surgeon: Thayer Headings, MD;  Location: [place];  Service: Cardiovascular;  Laterality: [name];  . DOPPLER ECHOCARDIOGRAPHY  11/2009   Decreased EF of 35-40%  . ELECTROPHYSIOLOGIC STUDY [name] 10/14/2015   Procedure: Atrial Fibrillation Ablation;  Surgeon: [name], MD;  Location: [place] [facility];  Service: Cardiovascular;  Laterality: [name];  . LEFT HEART CATHETERIZATION WITH CORONARY ANGIOGRAM [name] 08/18/2011   Procedure: LEFT HEART CATHETERIZATION WITH CORONARY ANGIOGRAM;  Surgeon: Peter M Martinique, MD;  Location[facility] C[facility];  Service: Cardiovascular;  Laterality: [name];  . NECK SURGERY     plate/fusion  . Stress Cardiolite  08/2005   Normal, EF 42%  . UPPER GASTROINTESTINAL ENDOSCOPY  08/2006   GERD    Current Outpatient Prescriptions  Medication Sig Dispense Refill  . carvedilol (COREG) 25 MG tablet Take 2 tablets (50 mg total) by mouth 2 (two) times daily with a meal. TAKE 1 AND 1/2 TABLETS (37.5 MG TOTAL) BY MOUTH 2 (TWO) TIMES DAILY WITH A MEAL. (Patient taking differently: Take 50 mg by mouth 2 (two) times daily with a meal. ) 120 tablet 6  . dofetilide (TIKOSYN) 500 MCG capsule Take 1 capsule (500  mcg total) by mouth 2 (two) times daily. 60 capsule 6  . glipiZIDE (GLUCOTROL XL) 5 MG 24 hr tablet Take 1 tablet (5 mg total) by mouth daily with breakfast. 30 tablet 11  . glucose blood (ONE TOUCH ULTRA TEST) test strip CHECK GLUCOSE TWICE DAILY AND AS NEEDED, DX. E11.65 (UNCONTROLLED DM) 100 each 5  . loratadine (CLARITIN) 10 MG tablet Take 10 mg by mouth daily as needed for allergies.    [name] losartan (COZAAR) 50 MG tablet Take 1 tablet (50 mg total) by mouth daily. 30 tablet 5  . metFORMIN (GLUCOPHAGE) 500 MG tablet TAKE 1 TABLET BY MOUTH TWICE DAILY WITH A MEAL 60 tablet 11  . Multiple Vitamin (MULTIVITAMIN) tablet Take 1 tablet by mouth daily.      [name] Bicarbonate (ZEGERID OTC) 20-1100 MG CAPS Take 1 capsule by mouth 2 (two) times daily.     . Potassium Chloride ER 20 MEQ TBCR Take 2 tablets by mouth in the AM & 1/2 tablet by mouth in the PM  11  . ranolazine (RANEXA) 500 MG 12 hr tablet Take 1 tablet (500 mg total) by mouth 2 (two) times daily. 60 tablet 6  . spironolactone (ALDACTONE) 25 MG tablet Take 1 tablet (25 mg total) by mouth daily. 30 tablet 8  . XARELTO 20 MG TABS tablet TAKE 1 TABLET BY MOUTH ONCE DAILY WITH SUPPER 30 tablet 2   No current facility-administered medications for this encounter.     No Known Allergies  Social History   Social History  . Marital status: Married    Spouse name: [name]  . Number of children: 2  . Years of education: [name]   Occupational History  . PAINTER Unemployed   Social History Main Topics  . Smoking status: Never Smoker  . Smokeless tobacco: Former Systems developer    Types: Snuff     Comment: 02/14/2013 "quit snuff in 2006"  . Alcohol use No  . Drug use: No  . Sexual activity: Yes   Other Topics Concern  . Not on file   Social History Narrative   Lives in [place] with wife.  Works @ [employer] in [employer].  Not routinely exercising.    Family History  Problem Relation Age of Onset  . Hypertension Mother   .  Diabetes Mother   . Heart failure Mother     Died @ 53  . Hypertension Father   . Diabetes Father   . Heart failure Father     Died @ 66  . Diabetes Brother   . Kidney disease Brother   . Diabetes Brother   . Other Sister     4 sisters A&W  . Heart attack Paternal Grandfather   . Stroke Paternal Grandmother   . Multiple myeloma Sister   . Leukemia Sister     ROS- All systems are reviewed and negative except as per the HPI above.  Physical Exam: Vitals:   11/06/15 1423  BP: 126/82  Pulse: 95  Weight: 295 lb 6.4 oz (134 kg)  Height: _0  (1.93 m)    GEN- The patient is well appearing, alert and oriented x 3 today.   Head- normocephalic, atraumatic Eyes-  Sclera clear, conjunctiva pink Ears- hearing intact Oropharynx- clear Neck- supple  Lungs- Clear to ausculation bilaterally, normal work of breathing Heart- Regular rate and rhythm, no murmurs, rubs or gallops  GI- soft, NT, ND, + BS Extremities- no clubbing, cyanosis, or edema MS- no significant deformity or atrophy Skin- no rash or lesion Psych- euthymic mood, full affect Neuro- strength and sensation are intact  Wt Readings from Last 3 Encounters:  11/06/15 295 lb 6.4 oz (134 kg)  10/14/15 285 lb (129.3 kg)  10/01/15 290 lb 9.6 oz (131.8 kg)    EKG today demonstrates sinus rhythm with ventricular pacing, rate 95  Epic records are reviewed at length today  Assessment and Plan:  1. Paroxysmal atrial fibrillation No recurrence by device interrogation today Continue Xarelto for CHADS2VASC of 3 He has had some throat soreness post procedure that is worse in the mornings and better with food. I do not think this is procedural related but will discuss with Dr Rayann Heman  2. Morbid obesity Body mass index is 35.96 kg/m. Lifestyle modification was discussed at length including regular exercise and weight reduction.  3. Obstructive sleep apnea The importance of adequate treatment of sleep apnea was discussed  today in order to improve our ability to maintain sinus rhythm long term. The patient reports  compliance with CPAP   4.  Chronic systolic heart failure Euvolemic on exam Normal device function See scanned report No changes today   5. Sinus tachycardia Histograms appropriate Will follow  Could consider Corlanor potentially at follow up  Follow up with Dr Rayann Heman as scheduled    [name], NP 11/06/2015 2:40 PM

## 2015-11-09 LAB — CUP PACEART REMOTE DEVICE CHECK
Battery Remaining Longevity: 37 mo
Brady Statistic AP VP Percent: 1 %
Brady Statistic AP VS Percent: 1 %
Brady Statistic AS VS Percent: 1 %
Date Time Interrogation Session: 20171005202549
HIGH POWER IMPEDANCE MEASURED VALUE: 91 Ohm
HighPow Impedance: 91 Ohm
Implantable Lead Location: 753859
Implantable Lead Model: 7122
Lead Channel Pacing Threshold Amplitude: 0.625 V
Lead Channel Sensing Intrinsic Amplitude: 5 mV
Lead Channel Setting Pacing Amplitude: 2 V
Lead Channel Setting Pacing Amplitude: 3.5 V
Lead Channel Setting Pacing Pulse Width: 0.8 ms
MDC IDC LEAD IMPLANT DT: 20150121
MDC IDC LEAD IMPLANT DT: 20150121
MDC IDC LEAD IMPLANT DT: 20150121
MDC IDC LEAD LOCATION: 753858
MDC IDC LEAD LOCATION: 753860
MDC IDC MSMT BATTERY REMAINING PERCENTAGE: 51 %
MDC IDC MSMT BATTERY VOLTAGE: 2.95 V
MDC IDC MSMT LEADCHNL LV IMPEDANCE VALUE: 980 Ohm
MDC IDC MSMT LEADCHNL LV PACING THRESHOLD AMPLITUDE: 2.5 V
MDC IDC MSMT LEADCHNL LV PACING THRESHOLD PULSEWIDTH: 0.8 ms
MDC IDC MSMT LEADCHNL RA IMPEDANCE VALUE: 530 Ohm
MDC IDC MSMT LEADCHNL RA PACING THRESHOLD PULSEWIDTH: 0.5 ms
MDC IDC MSMT LEADCHNL RV IMPEDANCE VALUE: 590 Ohm
MDC IDC MSMT LEADCHNL RV PACING THRESHOLD AMPLITUDE: 1 V
MDC IDC MSMT LEADCHNL RV PACING THRESHOLD PULSEWIDTH: 0.5 ms
MDC IDC MSMT LEADCHNL RV SENSING INTR AMPL: 11.8 mV
MDC IDC SET LEADCHNL RA PACING AMPLITUDE: 1.625
MDC IDC SET LEADCHNL RV PACING PULSEWIDTH: 0.5 ms
MDC IDC SET LEADCHNL RV SENSING SENSITIVITY: 0.5 mV
MDC IDC STAT BRADY AS VP PERCENT: 99 %
MDC IDC STAT BRADY RA PERCENT PACED: 1 %
Pulse Gen Serial Number: 7133975

## 2015-11-11 ENCOUNTER — Ambulatory Visit (HOSPITAL_COMMUNITY): Payer: BLUE CROSS/BLUE SHIELD | Admitting: Nurse Practitioner

## 2015-11-14 ENCOUNTER — Encounter: Payer: Self-pay | Admitting: Cardiology

## 2015-11-19 ENCOUNTER — Other Ambulatory Visit: Payer: Self-pay | Admitting: Nurse Practitioner

## 2015-11-20 ENCOUNTER — Other Ambulatory Visit: Payer: Self-pay | Admitting: Internal Medicine

## 2015-11-20 ENCOUNTER — Other Ambulatory Visit: Payer: Self-pay | Admitting: Family Medicine

## 2015-12-30 ENCOUNTER — Telehealth: Payer: Self-pay | Admitting: Cardiology

## 2015-12-30 NOTE — Telephone Encounter (Signed)
Spoke w/ pt and requested that he send a manual transmission b/c his home monitor has not updated in at least 14 days.   

## 2016-01-07 ENCOUNTER — Encounter: Payer: Self-pay | Admitting: Internal Medicine

## 2016-01-14 ENCOUNTER — Encounter: Payer: Self-pay | Admitting: Internal Medicine

## 2016-01-14 ENCOUNTER — Ambulatory Visit (INDEPENDENT_AMBULATORY_CARE_PROVIDER_SITE_OTHER): Payer: BLUE CROSS/BLUE SHIELD | Admitting: Internal Medicine

## 2016-01-14 VITALS — BP 128/86 | HR 83 | Ht 76.0 in | Wt 300.4 lb

## 2016-01-14 DIAGNOSIS — I428 Other cardiomyopathies: Secondary | ICD-10-CM | POA: Diagnosis not present

## 2016-01-14 DIAGNOSIS — Z4502 Encounter for adjustment and management of automatic implantable cardiac defibrillator: Secondary | ICD-10-CM

## 2016-01-14 DIAGNOSIS — I4819 Other persistent atrial fibrillation: Secondary | ICD-10-CM

## 2016-01-14 DIAGNOSIS — I481 Persistent atrial fibrillation: Secondary | ICD-10-CM | POA: Diagnosis not present

## 2016-01-14 NOTE — Patient Instructions (Addendum)
Medication Instructions:  Your physician has recommended you make the following change in your medication:  1) Stop Ranexa    Labwork: None ordered   Testing/Procedures: None ordered   Follow-Up: Your physician wants you to follow-up in: 5 months  with Dr Rayann Heman Dennis Bast will receive a reminder letter in the mail two months in advance. If you don't receive a letter, please call our office to schedule the follow-up appointment.   Remote monitoring is used to monitor your  ICD from home. This monitoring reduces the number of office visits required to check your device to one time per year. It allows [place] to keep an eye on the functioning of your device to ensure it is working properly. You are scheduled for a device check from home on 04/14/16. You may send your transmission at any time that day. If you have a wireless device, the transmission will be sent automatically. After your physician reviews your transmission, you will receive a postcard with your next transmission date.    Any Other Special Instructions Will Be Listed Below (If Applicable).     If you need a refill on your cardiac medications before your next appointment, please call your pharmacy.

## 2016-01-14 NOTE — Progress Notes (Signed)
Electrophysiology Office Note   Date:  01/14/2016   ID:  SHED NIXON, DOB 03-15-1960, MRN [id number]  PCP:  [name], MD  Cardiologist:  Dr [name] Primary Electrophysiologist: Dr Caryl Comes  Chief Complaint  Patient presents with  . Atrial Fibrillation     History of Present Illness: Aaron Thompson is a 55 y.o. male who presents today for electrophysiology evaluation.   He is doing very well.  Please with the results of his recent afib ablation.  He reports "I havent felt this good in 10 years". Today, he denies symptoms of palpitations, chest pain, shortness of breath, orthopnea, PND, lower extremity edema, claudication, dizziness, presyncope, syncope, bleeding, or neurologic sequela. The patient is tolerating medications without difficulties and is otherwise without complaint today.    Past Medical History:  Diagnosis Date  . Anxiety state, unspecified   . Calculus of kidney 1981   "passed it on my own"  . Chronic systolic CHF (congestive heart failure) ([place])   . Dermatophytosis of the body   . DJD (degenerative joint disease)   . Esophageal reflux   . Hx of colonic polyps   . Hypertension   . LBBB (left bundle branch block)    intermittent  . Non-ischemic cardiomyopathy ([place])    a. cath 2013: minor nonobstructive CAD.  . OSA on CPAP   . Osteoarthrosis, unspecified whether generalized or localized, hand   . Other chronic nonalcoholic liver disease    fatty liver  . PAF (paroxysmal atrial fibrillation) ([place])    a. s/p DCCV 6/11; previously on Pradaxa;  b. Event Monitor 2012->No PAF;  c. 09/2011 s/p DCCV ->Xarelto started. d. 03/2014: inappropriate ICD shocks for AF-RVR, started on Tikosyn, Xarelto restarted.  . Type II diabetes mellitus ([place])   . Unspecified hearing loss    Past Surgical History:  Procedure Laterality Date  . Abdominal US  01/2007   Fatty liver, no gallstones  . APPENDECTOMY    . BI-VENTRICULAR IMPLANTABLE CARDIOVERTER DEFIBRILLATOR [name] 02/14/2013     STJ CRTD implanted by Dr Caryl Comes  . BILATERAL KNEE ARTHROSCOPY    . CARDIOVERSION  09/29/2011   Procedure: CARDIOVERSION;  Surgeon: Thayer Headings, MD;  Location: [place];  Service: Cardiovascular;  Laterality: [name];  . DOPPLER ECHOCARDIOGRAPHY  11/2009   Decreased EF of 35-40%  . ELECTROPHYSIOLOGIC STUDY [name] 10/14/2015   Procedure: Atrial Fibrillation Ablation;  Surgeon: [name], MD;  Location: [place] [facility];  Service: Cardiovascular;  Laterality: [name];  . LEFT HEART CATHETERIZATION WITH CORONARY ANGIOGRAM [name] 08/18/2011   Procedure: LEFT HEART CATHETERIZATION WITH CORONARY ANGIOGRAM;  Surgeon: Peter M Martinique, MD;  Location: [facility] CATH LAB;  Service: Cardiovascular;  Laterality: [name];  . NECK SURGERY     plate/fusion  . Stress Cardiolite  08/2005   Normal, EF 42%  . UPPER GASTROINTESTINAL ENDOSCOPY  08/2006   GERD     Current Outpatient Prescriptions  Medication Sig Dispense Refill  . carvedilol (COREG) 25 MG tablet Take 50 mg by mouth 2 (two) times daily with a meal.    . dofetilide (TIKOSYN) 500 MCG capsule TAKE 1 CAPSULE(500 MCG) BY MOUTH TWICE DAILY 60 capsule 5  . glipiZIDE (GLUCOTROL XL) 5 MG 24 hr tablet TAKE 1 TABLET(5 MG) BY MOUTH DAILY WITH BREAKFAST 30 tablet 4  . glucose blood (ONE TOUCH ULTRA TEST) test strip CHECK GLUCOSE TWICE DAILY AND AS NEEDED, DX. E11.65 (UNCONTROLLED DM) 100 each 5  . loratadine (CLARITIN) 10 MG tablet Take 10 mg  by mouth daily as needed for allergies.    [name] losartan (COZAAR) 50 MG tablet Take 1 tablet (50 mg total) by mouth daily. 30 tablet 5  . metFORMIN (GLUCOPHAGE) 500 MG tablet TAKE 1 TABLET BY MOUTH TWICE DAILY WITH A MEAL 60 tablet 11  . Multiple Vitamin (MULTIVITAMIN) tablet Take 1 tablet by mouth daily.      [name] Bicarbonate (ZEGERID OTC) 20-1100 MG CAPS Take 1 capsule by mouth 2 (two) times daily.     . Potassium Chloride ER 20 MEQ TBCR Take 2 tablets by mouth in the AM & 1/2 tablet by mouth in the PM  11  . ranolazine  (RANEXA) 500 MG 12 hr tablet Take 1 tablet (500 mg total) by mouth 2 (two) times daily. 60 tablet 6  . spironolactone (ALDACTONE) 25 MG tablet Take 1 tablet (25 mg total) by mouth daily. 30 tablet 8  . XARELTO 20 MG TABS tablet TAKE 1 TABLET BY MOUTH EVERY DAY WITH SUPPER 30 tablet 11   No current facility-administered medications for this visit.     Allergies:   Patient has no known allergies.   Social History:  The patient  reports that he has never smoked. He has quit using smokeless tobacco. His smokeless tobacco use included Snuff. He reports that he does not drink alcohol or use drugs.   Family History:  The patient's family history includes Diabetes in his brother, brother, father, and mother; Heart attack in his paternal grandfather; Heart failure in his father and mother; Hypertension in his father and mother; Kidney disease in his brother; Leukemia in his sister; Multiple myeloma in his sister; Other in his sister; Stroke in his paternal grandmother.    ROS:  Please see the history of present illness.   All other systems are reviewed and negative.    PHYSICAL EXAM: VS:  BP 128/86   Pulse 83   Ht '6\' 4"'  (1.93 m)   Wt (!) 300 lb 6.4 oz (136.3 kg)   BMI 36.57 kg/m  , BMI Body mass index is 36.57 kg/m. GEN: Well nourished, well developed, in no acute distress  HEENT: normal  Neck: no JVD, carotid bruits, or masses Cardiac: RRR; no murmurs, rubs, or gallops,no edema  Respiratory:  clear to auscultation bilaterally, normal work of breathing GI: soft, nontender, nondistended, + BS MS: no deformity or atrophy  Skin: warm and dry, device pocket is well healed Neuro:  Strength and sensation are intact Psych: euthymic mood, full affect  EKG:  EKG is ordered today. The ekg ordered today shows sinus with BiV pacing, Qtc 524 msec  Device interrogation is reviewed today in detail.  See PaceArt for details.   Recent Labs: 04/07/2015: ALT 23 07/29/2015: B Natriuretic Peptide 24.9;  TSH 2.041 10/01/2015: Hemoglobin 14.6; Platelets 191 10/15/2015: BUN 11; Creatinine, Ser 0.78; Magnesium 1.7; Potassium 3.3; Sodium 135    Lipid Panel     Component Value Date/Time   CHOL 149 04/07/2015 0834   TRIG 129.0 04/07/2015 0834   HDL 29.70 (L) 04/07/2015 0834   CHOLHDL 5 04/07/2015 0834   VLDL 25.8 04/07/2015 0834   LDLCALC 94 04/07/2015 0834   LDLDIRECT 109.1 02/01/2013 1516     Wt Readings from Last 3 Encounters:  01/14/16 (!) 300 lb 6.4 oz (136.3 kg)  11/06/15 295 lb 6.4 oz (134 kg)  10/14/15 285 lb (129.3 kg)        ASSESSMENT AND PLAN:  1.  Persistent atrial fibrillation Doing very well  s/p ablation Stop ranexa Consider stopping tikosyn upon follow-up with Dr [name] Continue long term anticoagulation  2. Nonischemic CM Device interrogation is reviewed today and normal No changes See paceart  Follow-up with Dr [name] as scheduled in February.   I will see 3 months after that.  Will anticipate follow-up with Dr Caryl Comes around August or September.  Merlin   Signed, [name], MD  01/14/2016 8:33 AM     [facility] [address] [place] [address] [phone] (office) [phone] (fax)

## 2016-01-21 ENCOUNTER — Telehealth: Payer: Self-pay | Admitting: Cardiovascular Disease

## 2016-01-21 NOTE — Telephone Encounter (Signed)
Hospital note on 08/12/15--  Hospital Course:  The patient was c/o recurrent chest pressure/SOB c/w his prior arrhythmias, stated he knows this to be his arrhythmia symptom, and is completely asymptomatic when in SR, with no c/o CP or SOB.  The patient had has a number of inappropriate shocks in the past for rapid AFib as well as numerous DCCV.   The patient was monitored on telemetry with brief episodes of tachy rates, paced generally, at times with intrinsic conduction/WCT, interrogation of his device showed no VT, only PAFib/PAT episodes.  Ranolazine was added to his regime, with reduction in the frequency of these brief tachycardic episodes.  The ICD was reinterrogated the day of discharge and reprogrammed to eliminate PMT that was observed on telemetry.  He is continued on his Tikosyn, QTc stable,  He has required K+ replacement and will discharge him home on increased dose. We will increase his coreg to 50mg  BID.  Dr. Caryl Comes in discussion with the patient, suggests evaluation for AFib/Atach ablation and out patient consult with Dr. Rayann Heman has been arranged.  Early follow up is also arranged.    Since, Dr. Rayann Heman has seen patient recently, office visit 01/14/16. Will clarify that patient needs to be on Coreg 50 mg BID with him. Will forward to Dr. Rayann Heman.

## 2016-01-21 NOTE — Telephone Encounter (Signed)
Follow Up:    Aaron Thompson at [place] waiting to hear something.

## 2016-01-21 NOTE — Telephone Encounter (Signed)
Patient is taking 50 mg bid.  Dr Allred aware and his HR was still 83

## 2016-01-21 NOTE — Telephone Encounter (Signed)
New Message  Pt c/o medication issue:  1. Name of Medication: carvedilol (Coreg) 25 mg tablet take 50 mg by mouth two times daily with a meal.  2. How are you currently taking this medication (dosage and times per day)? See above  3. Are you having a reaction (difficulty breathing--STAT)? [name]  4. What is your medication issue? Pharmacist has questions about the directions of this medication.  Please f/u

## 2016-02-09 ENCOUNTER — Telehealth: Payer: Self-pay | Admitting: Cardiovascular Disease

## 2016-02-09 NOTE — Telephone Encounter (Signed)
Called pt and let him know that his transmission was received, and he was out in a-fib for about 12 minutes total. Pt stated that he could tell because he gets SOB, but he could tell he back in regular rhythm now and is feeling fine. Pt stated that he had taken his Tikosyn today informed pt to continue taking his mediations as scheduled. Pt voiced understanding.

## 2016-02-09 NOTE — Telephone Encounter (Signed)
New Message    Pt c/o Shortness Of Breath: STAT if SOB developed within the last 24 hours or pt is noticeably SOB on the phone  1. Are you currently SOB (can you hear that pt is SOB on the phone)? Yes  2. How long have you been experiencing SOB? About 10-14 minutes  3. Are you SOB when sitting or when up moving around? Sitting down  4. Are you currently experiencing any other symptoms? Some chest pain

## 2016-02-09 NOTE — Telephone Encounter (Signed)
Received a call from patient he stated he feels like his heart went out of rhythm for about 10 mins this afternoon,feels like back in rhythm at present.Stated has not been out of rhythm since ablation in Sept.No chest pain.Stated he will send I a transmission from his ICD.Advised I will send message to device clinic.

## 2016-02-18 ENCOUNTER — Other Ambulatory Visit: Payer: Self-pay | Admitting: Nurse Practitioner

## 2016-02-19 ENCOUNTER — Telehealth: Payer: Self-pay | Admitting: Cardiovascular Disease

## 2016-02-19 NOTE — Telephone Encounter (Signed)
°  3:43 PM 02/19/2016  Patient calling the office for samples of medication:   1.  What medication and dosage are you requesting samples for? Tikosyn  2.  Are you currently out of this medication? wont have enough until appt,not sure he will continue to be on this medicine

## 2016-02-20 NOTE — Telephone Encounter (Signed)
Returned call to pt. He has refills left on his Tikosyn so there is no issue picking up his medication, but his copay is more expensive because it's the beginning of the year and he has a deductible to meet. Pt was wondering if we could provide him with samples until his next visit with Dr [name] because he stated that there is a chance that his Tikosyn will be discontinued and he didn't want to pay his copay. Advised pt that we unfortunately do not have a month's worth of Tikosyn samples. He stated that he would pick up rx. No further questions.

## 2016-03-01 ENCOUNTER — Other Ambulatory Visit: Payer: Self-pay | Admitting: Nurse Practitioner

## 2016-03-08 ENCOUNTER — Telehealth: Payer: Self-pay | Admitting: Family Medicine

## 2016-03-08 ENCOUNTER — Telehealth: Payer: Self-pay | Admitting: Cardiology

## 2016-03-08 DIAGNOSIS — IMO0001 Reserved for inherently not codable concepts without codable children: Secondary | ICD-10-CM

## 2016-03-08 DIAGNOSIS — E1165 Type 2 diabetes mellitus with hyperglycemia: Principal | ICD-10-CM

## 2016-03-08 DIAGNOSIS — Z125 Encounter for screening for malignant neoplasm of prostate: Secondary | ICD-10-CM | POA: Insufficient documentation

## 2016-03-08 DIAGNOSIS — Z0000 Encounter for general adult medical examination without abnormal findings: Secondary | ICD-10-CM | POA: Insufficient documentation

## 2016-03-08 NOTE — Telephone Encounter (Signed)
-----   Message from Ellamae Sia sent at 03/08/2016 10:13 AM EST ----- Regarding: Lab orders for Tuesday, 2.13.18 Patient is scheduled for CPX labs, please order future labs, Thanks , Karna Christmas

## 2016-03-08 NOTE — Telephone Encounter (Signed)
Spoke w/ pt and requested that he send a manual transmission b/c his home monitor has not updated in at least 7 days.   

## 2016-03-10 NOTE — Progress Notes (Signed)
Cardiology Office Note Date:  03/19/2016  Patient ID:  Aaron Thompson, Aaron Thompson [birth year], MRN [id number] PCP:  [name], MD  Electrophysiologist: Dr. Caryl Comes Cardiologist: Dr. [name]   Chief Complaint: F/U PAF   History of Present Illness: Aaron Thompson is a 56 y.o. male with history of PAfib, NICM, HTN, morbid obesity, OSA, reports unable to tolerate CPAP, DM, LBBB   Hospital stay from  07/29/15 he was seen in the ER with palpitations/SOB that woke him, he was found to be in AFib with RVR and had DCCV in the ER.  No changes were made to f/u with sleep study and apnea tx.  He was unfortunately hospitalized again with RAFib 08/12/15 and had DCCV in ED with ERAF the same day and returned to the ED and was admitted, he was observed to have bothe AFib and ATach.   his coreg further up-titrated and ranolazine added.Aaron Thompson  His K+ was increased with hypokalemia again noted.  Subsequently seen by JA for afib ablation that was done on 10/14/15.  Ranexa stopped and plans were to stop tikosyn soon.   Had 12 minutes episode of PAF a month ago     AFib Hx: ER visits with RVR requiring DCCV Feb and March July 2017 x2, previous DCCV 2011, 2013 Rapid Afib with inapproriate shocks April 2016, Feb 2017 Tikosyn Xarelto Genetic AF trial visit 06/18/15, screen fail, negative for gene Afib Ablation JA 10/14/15  Device History:  STJ CRTD implanted 2015 for NICM History of appropriate therapy: No - inappropriate therapy for AF with RVR History of AAD therapy: Yes - Tikosyn for AF    Past Medical History:  Diagnosis Date  . Anxiety state, unspecified   . Calculus of kidney 1981   "passed it on my own"  . Chronic systolic CHF (congestive heart failure) ([place])   . Dermatophytosis of the body   . DJD (degenerative joint disease)   . Esophageal reflux   . Hx of colonic polyps   . Hypertension   . LBBB (left bundle branch block)    intermittent  . Non-ischemic cardiomyopathy ([place])    a. cath 2013: minor  nonobstructive CAD.  . OSA on CPAP   . Osteoarthrosis, unspecified whether generalized or localized, hand   . Other chronic nonalcoholic liver disease    fatty liver  . PAF (paroxysmal atrial fibrillation) ([place])    a. s/p DCCV 6/11; previously on Pradaxa;  b. Event Monitor 2012->No PAF;  c. 09/2011 s/p DCCV ->Xarelto started. d. 03/2014: inappropriate ICD shocks for AF-RVR, started on Tikosyn, Xarelto restarted.  . Type II diabetes mellitus ([place])   . Unspecified hearing loss     Past Surgical History:  Procedure Laterality Date  . Abdominal US  01/2007   Fatty liver, no gallstones  . APPENDECTOMY    . BI-VENTRICULAR IMPLANTABLE CARDIOVERTER DEFIBRILLATOR [name] 02/14/2013   STJ CRTD implanted by Dr Caryl Comes  . BILATERAL KNEE ARTHROSCOPY    . CARDIOVERSION  09/29/2011   Procedure: CARDIOVERSION;  Surgeon: Thayer Headings, MD;  Location: [facility];  Service: Cardiovascular;  Laterality: [name];  . DOPPLER ECHOCARDIOGRAPHY  11/2009   Decreased EF of 35-40%  . ELECTROPHYSIOLOGIC STUDY [name] 10/14/2015   Procedure: Atrial Fibrillation Ablation;  Surgeon: [name], MD;  Location: [facility];  Service: Cardiovascular;  Laterality: [name];  . LEFT HEART CATHETERIZATION WITH CORONARY ANGIOGRAM [name] 08/18/2011   Procedure: LEFT HEART CATHETERIZATION WITH CORONARY ANGIOGRAM;  Surgeon: Gatsby Chismar M Martinique, MD;  Location: [facility]  CATH LAB;  Service: Cardiovascular;  Laterality: [name];  . NECK SURGERY     plate/fusion  . Stress Cardiolite  08/2005   Normal, EF 42%  . UPPER GASTROINTESTINAL ENDOSCOPY  08/2006   GERD    Current Outpatient Prescriptions  Medication Sig Dispense Refill  . carvedilol (COREG) 25 MG tablet Take 50 mg by mouth 2 (two) times daily with a meal.    . dofetilide (TIKOSYN) 500 MCG capsule TAKE 1 CAPSULE(500 MCG) BY MOUTH TWICE DAILY 60 capsule 5  . glipiZIDE (GLUCOTROL XL) 5 MG 24 hr tablet TAKE 1 TABLET(5 MG) BY MOUTH DAILY WITH BREAKFAST 30 tablet 4  . glucose blood (ONE TOUCH ULTRA TEST) test  strip CHECK GLUCOSE TWICE DAILY AND AS NEEDED, DX. E11.65 (UNCONTROLLED DM) 100 each 5  . loratadine (CLARITIN) 10 MG tablet Take 10 mg by mouth daily as needed for allergies.    Aaron Thompson losartan (COZAAR) 50 MG tablet TAKE 1 TABLET(50 MG) BY MOUTH DAILY 30 tablet 9  . metFORMIN (GLUCOPHAGE) 500 MG tablet TAKE 1 TABLET BY MOUTH TWICE DAILY WITH A MEAL 60 tablet 11  . Multiple Vitamin (MULTIVITAMIN) tablet Take 1 tablet by mouth daily.      Aaron Thompson Bicarbonate (ZEGERID OTC) 20-1100 MG CAPS Take 1 capsule by mouth 2 (two) times daily.     . Potassium Chloride ER 20 MEQ TBCR Take 2 tablets by mouth in the AM & 1/2 tablet by mouth in the PM  11  . spironolactone (ALDACTONE) 25 MG tablet Take 1 tablet (25 mg total) by mouth daily. 30 tablet 8  . XARELTO 20 MG TABS tablet TAKE 1 TABLET(20 MG) BY MOUTH DAILY WITH SUPPER 30 tablet 5   No current facility-administered medications for this visit.     Allergies:   Patient has no known allergies.   Social History:  The patient  reports that he has never smoked. He has quit using smokeless tobacco. His smokeless tobacco use included Snuff. He reports that he does not drink alcohol or use drugs.   Family History:  The patient's family history includes Diabetes in his brother, brother, father, and mother; Heart attack in his paternal grandfather; Heart failure in his father and mother; Hypertension in his father and mother; Kidney disease in his brother; Leukemia in his sister; Multiple myeloma in his sister; Other in his sister; Stroke in his paternal grandmother.  ROS:  Please see the history of present illness.    All other systems are reviewed and otherwise negative.   PHYSICAL EXAM:  VS:  BP 110/80   Pulse (!) 118   Ht '6\' 4"'  (1.93 m)   Wt 299 lb 12.8 oz (136 kg)   SpO2 97%   BMI 36.49 kg/m  BMI: Body mass index is 36.49 kg/m. Well nourished, obese, well developed, in no acute distress  HEENT: normocephalic, atraumatic  Neck: no JVD,  carotid bruits or masses Cardiac:  RRR; no significant murmurs, no rubs, or gallops Lungs:  clear to auscultation bilaterally, no wheezing, rhonchi or rales  Abd: soft, nontender MS: no deformity or atrophy Ext: no edema  Skin: warm and dry, no rash Neuro:  No gross deficits appreciated Psych: euthymic mood, full affect  ICD site is stable, no tethering or discomfort   EKG:  Done today and reviewed by myself shows SR, V paced,  QTc stable ICD interrogation today: Normal device function, battery status OK, 98% BiVe pacing, 2 very brief ATach episodes, 3 and 2 seconds since last  eval in the hospital, no VT  04/19/14: Echocardiogram Study Conclusions - Left ventricle: LVEF is severely depressed at approximately 15 to 20% with diffuse hypokinesis. The cavity size was severely dilated. Wall thickness was normal. Doppler parameters are consistent with abnormal left ventricular relaxation (grade 1 diastolic dysfunction). - Mitral valve: There was mild regurgitation. - Left atrium: The atrium was mildly dilated. 109m - Right ventricle: The cavity size was mildly dilated. Systolic function was moderately to severely reduced.   09/22/11; CPX Conclusion: Exercise testing with gas exchange demonstrated a normal functional capacity when compared to matched sedentary norms. There is no evidence of a cardiovascular limitation to the exercise. Based on his absolute oxygen uptake, the patient is actually in quite good cardiovascular shape however his actual function capacity is markedly limited by his obesity and related ventilatory limitation.  Recent Labs: 04/07/2015: ALT 23 07/29/2015: B Natriuretic Peptide 24.9; TSH 2.041 10/01/2015: Hemoglobin 14.6; Platelets 191 10/15/2015: BUN 11; Creatinine, Ser 0.78; Magnesium 1.7; Potassium 3.3; Sodium 135  04/07/2015: Cholesterol 149; HDL 29.70; LDL Cholesterol 94; Total CHOL/HDL Ratio 5; Triglycerides 129.0; VLDL 25.8   CrCl cannot be  calculated (Patient's most recent lab result is older than the maximum 21 days allowed.).   Wt Readings from Last 3 Encounters:  03/19/16 299 lb 12.8 oz (136 kg)  01/14/16 (!) 300 lb 6.4 oz (136.3 kg)  11/06/15 295 lb 6.4 oz (134 kg)     Other studies reviewed: Additional studies/records reviewed today include: summarized above  DEVICE information: SJM CRT-D, implanted 12/25/13, Dr. KCaryl ComesThe patient has had inappropriate shocks for rapid AF  ASSESSMENT AND PLAN:  1. PAFib    CHA2DS2Vasc is at least 3, on Xarelto    Multiple DCCV   Still on Tikosyn post ablation had 12 minutes episode PAF last month will continue tikosyn continue life long anticoagulation  Will see Dr ARayann Hemanin May       2. NICM/ICD     normal device function     98% bive paced     corvue is down, though so is his weight, exam is euvolemic and no symptoms of fluid OL     On BB/ARB  3. HTN     stable  4. Obesity, OSA     reports CPAP unable to tolerate mask, would like to visit with sleep apnea specialist to discuss options, he thinks his AF more often triggered at night  5. Arthritis :  He is a pCuratorhas arthritis in shoulders and hands will prescribe PRN tramadol   Disposition:    PJenkins [place]

## 2016-03-16 ENCOUNTER — Other Ambulatory Visit: Payer: BLUE CROSS/BLUE SHIELD

## 2016-03-19 ENCOUNTER — Encounter: Payer: Self-pay | Admitting: Cardiovascular Disease

## 2016-03-19 ENCOUNTER — Ambulatory Visit (INDEPENDENT_AMBULATORY_CARE_PROVIDER_SITE_OTHER): Payer: BLUE CROSS/BLUE SHIELD | Admitting: Cardiovascular Disease

## 2016-03-19 VITALS — BP 110/80 | HR 88 | Ht 76.0 in | Wt 299.8 lb

## 2016-03-19 DIAGNOSIS — I4819 Other persistent atrial fibrillation: Secondary | ICD-10-CM

## 2016-03-19 DIAGNOSIS — Z79899 Other long term (current) drug therapy: Secondary | ICD-10-CM | POA: Diagnosis not present

## 2016-03-19 DIAGNOSIS — I481 Persistent atrial fibrillation: Secondary | ICD-10-CM | POA: Diagnosis not present

## 2016-03-19 LAB — BASIC METABOLIC PANEL
BUN/Creatinine Ratio: 26 — ABNORMAL HIGH (ref 9–20)
BUN: 21 mg/dL (ref 6–24)
CALCIUM: 9.3 mg/dL (ref 8.7–10.2)
CHLORIDE: 97 mmol/L (ref 96–106)
CO2: 20 mmol/L (ref 18–29)
CREATININE: 0.8 mg/dL (ref 0.76–1.27)
GFR calc Af Amer: 117 mL/min/{1.73_m2} (ref >59)
GFR calc non Af Amer: 101 mL/min/{1.73_m2} (ref >59)
GLUCOSE: 212 mg/dL — AB (ref 65–99)
Potassium: 4.5 mmol/L (ref 3.5–5.2)
Sodium: 139 mmol/L (ref 134–144)

## 2016-03-19 MED ORDER — SPIRONOLACTONE 25 MG PO TABS: 25.0000 mg | ORAL_TABLET | Freq: Every day | ORAL | 3 refills | Status: DC

## 2016-03-19 MED ORDER — POTASSIUM CHLORIDE ER 20 MEQ PO TBCR: EXTENDED_RELEASE_TABLET | ORAL | 3 refills | Status: DC

## 2016-03-19 MED ORDER — DOFETILIDE 500 MCG PO CAPS
ORAL_CAPSULE | ORAL | 3 refills | Status: DC
Start: 2016-03-19 — End: 2017-03-13

## 2016-03-19 MED ORDER — CARVEDILOL 25 MG PO TABS: 50.0000 mg | ORAL_TABLET | Freq: Two times a day (BID) | ORAL | 3 refills | Status: DC

## 2016-03-19 MED ORDER — TRAMADOL HCL 50 MG PO TABS
50.0000 mg | ORAL_TABLET | Freq: Two times a day (BID) | ORAL | 0 refills | Status: DC
Start: 2016-03-19 — End: 2016-03-19

## 2016-03-19 MED ORDER — TRAMADOL HCL 50 MG PO TABS: 50.0000 mg | ORAL_TABLET | Freq: Two times a day (BID) | ORAL | 0 refills | Status: DC | PRN

## 2016-03-19 NOTE — Patient Instructions (Addendum)
Medication Instructions:  Your physician recommends that you continue on your current medications as directed. Please refer to the Current Medication list given to you today.  Labwork: Your physician recommends that you have lab work today- BMET   Testing/Procedures: NONE  Follow-Up: Your physician wants you to follow-up in: 6 months with Dr. Nishan. You will receive a reminder letter in the mail two months in advance. If you don't receive a letter, please call our office to schedule the follow-up appointment.   If you need a refill on your cardiac medications before your next appointment, please call your pharmacy.    

## 2016-03-23 ENCOUNTER — Encounter: Payer: BLUE CROSS/BLUE SHIELD | Admitting: Family Medicine

## 2016-04-05 ENCOUNTER — Telehealth: Payer: Self-pay | Admitting: Cardiology

## 2016-04-05 NOTE — Telephone Encounter (Signed)
LMOVM requesting that pt send manual transmission b/c home monitor has not updated in at least 7 days.    

## 2016-04-14 ENCOUNTER — Ambulatory Visit (INDEPENDENT_AMBULATORY_CARE_PROVIDER_SITE_OTHER): Payer: BLUE CROSS/BLUE SHIELD | Admitting: *Deleted

## 2016-04-14 ENCOUNTER — Other Ambulatory Visit: Payer: Self-pay | Admitting: Family Medicine

## 2016-04-14 ENCOUNTER — Telehealth: Payer: Self-pay | Admitting: Cardiology

## 2016-04-14 ENCOUNTER — Other Ambulatory Visit: Payer: Self-pay | Admitting: Nurse Practitioner

## 2016-04-14 DIAGNOSIS — I428 Other cardiomyopathies: Secondary | ICD-10-CM

## 2016-04-14 NOTE — Progress Notes (Signed)
Remote ICD transmission.   

## 2016-04-14 NOTE — Telephone Encounter (Signed)
Spoke with pt and reminded pt of remote transmission that is due today. Pt verbalized understanding.   

## 2016-04-15 ENCOUNTER — Encounter: Payer: Self-pay | Admitting: Cardiology

## 2016-04-15 LAB — CUP PACEART REMOTE DEVICE CHECK
Battery Remaining Percentage: 43 %
Brady Statistic AP VP Percent: 1 %
Brady Statistic AP VS Percent: 1 %
Brady Statistic AS VP Percent: 99 %
Brady Statistic AS VS Percent: 1 %
HIGH POWER IMPEDANCE MEASURED VALUE: 87 Ohm
HighPow Impedance: 87 Ohm
Implantable Lead Location: 753859
Implantable Lead Location: 753860
Implantable Lead Model: 7122
Lead Channel Impedance Value: 460 Ohm
Lead Channel Impedance Value: 460 Ohm
Lead Channel Impedance Value: 980 Ohm
Lead Channel Pacing Threshold Amplitude: 0.75 V
Lead Channel Pacing Threshold Amplitude: 1 V
Lead Channel Pacing Threshold Amplitude: 1.75 V
Lead Channel Pacing Threshold Pulse Width: 0.5 ms
Lead Channel Sensing Intrinsic Amplitude: 12 mV
Lead Channel Setting Pacing Amplitude: 3.5 V
Lead Channel Setting Pacing Pulse Width: 0.5 ms
Lead Channel Setting Pacing Pulse Width: 0.8 ms
Lead Channel Setting Sensing Sensitivity: 0.5 mV
MDC IDC LEAD IMPLANT DT: 20150121
MDC IDC LEAD IMPLANT DT: 20150121
MDC IDC LEAD IMPLANT DT: 20150121
MDC IDC LEAD LOCATION: 753858
MDC IDC MSMT BATTERY REMAINING LONGEVITY: 31 mo
MDC IDC MSMT BATTERY VOLTAGE: 2.95 V
MDC IDC MSMT LEADCHNL LV PACING THRESHOLD PULSEWIDTH: 0.8 ms
MDC IDC MSMT LEADCHNL RA SENSING INTR AMPL: 5 mV
MDC IDC MSMT LEADCHNL RV PACING THRESHOLD PULSEWIDTH: 0.5 ms
MDC IDC PG IMPLANT DT: 20150121
MDC IDC PG SERIAL: 7133975
MDC IDC SESS DTM: 20180321164901
MDC IDC SET LEADCHNL RA PACING AMPLITUDE: 1.75 V
MDC IDC SET LEADCHNL RV PACING AMPLITUDE: 2 V
MDC IDC STAT BRADY RA PERCENT PACED: 1 %

## 2016-04-26 ENCOUNTER — Telehealth: Payer: Self-pay | Admitting: Cardiology

## 2016-04-26 NOTE — Telephone Encounter (Signed)
Spoke w/ pt and requested that he send a manual transmission b/c his home monitor has not updated in at least 7 days.   

## 2016-04-29 ENCOUNTER — Encounter: Payer: Self-pay | Admitting: Cardiology

## 2016-05-14 ENCOUNTER — Other Ambulatory Visit: Payer: Self-pay | Admitting: Cardiovascular Disease

## 2016-05-17 NOTE — Telephone Encounter (Signed)
Pt's pharmacy is requesting a refill on Tramadol. Please advise

## 2016-06-14 ENCOUNTER — Encounter: Payer: Self-pay | Admitting: Internal Medicine

## 2016-06-14 ENCOUNTER — Ambulatory Visit (INDEPENDENT_AMBULATORY_CARE_PROVIDER_SITE_OTHER): Payer: BLUE CROSS/BLUE SHIELD | Admitting: Internal Medicine

## 2016-06-14 VITALS — BP 124/80 | HR 80 | Ht 76.0 in | Wt 301.8 lb

## 2016-06-14 DIAGNOSIS — I480 Paroxysmal atrial fibrillation: Secondary | ICD-10-CM | POA: Diagnosis not present

## 2016-06-14 DIAGNOSIS — I428 Other cardiomyopathies: Secondary | ICD-10-CM | POA: Diagnosis not present

## 2016-06-14 DIAGNOSIS — I4891 Unspecified atrial fibrillation: Secondary | ICD-10-CM | POA: Diagnosis not present

## 2016-06-14 DIAGNOSIS — I5022 Chronic systolic (congestive) heart failure: Secondary | ICD-10-CM | POA: Diagnosis not present

## 2016-06-14 LAB — CUP PACEART INCLINIC DEVICE CHECK
Brady Statistic RA Percent Paced: 0.15 %
Brady Statistic RV Percent Paced: 99 %
HIGH POWER IMPEDANCE MEASURED VALUE: 90 Ohm
Implantable Lead Implant Date: 20150121
Implantable Lead Implant Date: 20150121
Implantable Lead Location: 753859
Implantable Lead Location: 753860
Implantable Lead Model: 7122
Lead Channel Impedance Value: 487.5 Ohm
Lead Channel Impedance Value: 937.5 Ohm
Lead Channel Pacing Threshold Amplitude: 0.75 V
Lead Channel Pacing Threshold Amplitude: 0.75 V
Lead Channel Pacing Threshold Amplitude: 1.75 V
Lead Channel Pacing Threshold Pulse Width: 0.5 ms
Lead Channel Pacing Threshold Pulse Width: 0.5 ms
Lead Channel Sensing Intrinsic Amplitude: 11.8 mV
Lead Channel Setting Pacing Amplitude: 2.125
Lead Channel Setting Pacing Amplitude: 3.5 V
Lead Channel Setting Pacing Pulse Width: 0.5 ms
Lead Channel Setting Pacing Pulse Width: 0.8 ms
MDC IDC LEAD IMPLANT DT: 20150121
MDC IDC LEAD LOCATION: 753858
MDC IDC MSMT LEADCHNL LV PACING THRESHOLD AMPLITUDE: 1.75 V
MDC IDC MSMT LEADCHNL LV PACING THRESHOLD PULSEWIDTH: 0.8 ms
MDC IDC MSMT LEADCHNL LV PACING THRESHOLD PULSEWIDTH: 0.8 ms
MDC IDC MSMT LEADCHNL RA IMPEDANCE VALUE: 462.5 Ohm
MDC IDC MSMT LEADCHNL RA PACING THRESHOLD AMPLITUDE: 0.75 V
MDC IDC MSMT LEADCHNL RA PACING THRESHOLD PULSEWIDTH: 0.5 ms
MDC IDC MSMT LEADCHNL RA PACING THRESHOLD PULSEWIDTH: 0.5 ms
MDC IDC MSMT LEADCHNL RA SENSING INTR AMPL: 5 mV
MDC IDC MSMT LEADCHNL RV PACING THRESHOLD AMPLITUDE: 0.75 V
MDC IDC PG IMPLANT DT: 20150121
MDC IDC SESS DTM: 20180521112552
MDC IDC SET LEADCHNL RA PACING AMPLITUDE: 1.75 V
MDC IDC SET LEADCHNL RV SENSING SENSITIVITY: 0.5 mV
Pulse Gen Serial Number: 7133975

## 2016-06-14 LAB — CBC WITH DIFFERENTIAL/PLATELET
Basophils Absolute: 0 10*3/uL (ref 0.0–0.2)
Basos: 0 %
EOS (ABSOLUTE): 0.1 10*3/uL (ref 0.0–0.4)
Eos: 2 %
Hematocrit: 43.4 % (ref 37.5–51.0)
Hemoglobin: 14.9 g/dL (ref 13.0–17.7)
IMMATURE GRANULOCYTES: 0 %
Immature Grans (Abs): 0 10*3/uL (ref 0.0–0.1)
LYMPHS ABS: 1.8 10*3/uL (ref 0.7–3.1)
Lymphs: 24 %
MCH: 29.7 pg (ref 26.6–33.0)
MCHC: 34.3 g/dL (ref 31.5–35.7)
MCV: 87 fL (ref 79–97)
MONOS ABS: 0.4 10*3/uL (ref 0.1–0.9)
Monocytes: 6 %
NEUTROS PCT: 68 %
Neutrophils Absolute: 5.1 10*3/uL (ref 1.4–7.0)
PLATELETS: 186 10*3/uL (ref 150–379)
RBC: 5.02 x10E6/uL (ref 4.14–5.80)
RDW: 14 % (ref 12.3–15.4)
WBC: 7.5 10*3/uL (ref 3.4–10.8)

## 2016-06-14 LAB — BASIC METABOLIC PANEL
BUN/Creatinine Ratio: 21 — ABNORMAL HIGH (ref 9–20)
BUN: 15 mg/dL (ref 6–24)
CALCIUM: 9.2 mg/dL (ref 8.7–10.2)
CHLORIDE: 99 mmol/L (ref 96–106)
CO2: 23 mmol/L (ref 18–29)
Creatinine, Ser: 0.7 mg/dL — ABNORMAL LOW (ref 0.76–1.27)
GFR calc non Af Amer: 107 mL/min/{1.73_m2} (ref >59)
GFR, EST AFRICAN AMERICAN: 124 mL/min/{1.73_m2} (ref >59)
Glucose: 176 mg/dL — ABNORMAL HIGH (ref 65–99)
Potassium: 4.5 mmol/L (ref 3.5–5.2)
SODIUM: 139 mmol/L (ref 134–144)

## 2016-06-14 LAB — MAGNESIUM: Magnesium: 2 mg/dL (ref 1.6–2.3)

## 2016-06-14 NOTE — Progress Notes (Signed)
PCP: Abner Greenspan, MD Primary Cardiologist:  Dr [name] Primary EP:  Dr Cherlynn Polo is a 56 y.o. male who presents today for routine electrophysiology followup.  Since last being seen in our clinic, the patient reports doing very well.  afib is well controlled post ablation.  Today, he denies symptoms of palpitations, chest pain, shortness of breath,  lower extremity edema, dizziness, presyncope, or syncope.  The patient is otherwise without complaint today.   Past Medical History:  Diagnosis Date  . Anxiety state, unspecified   . Calculus of kidney 1981   "passed it on my own"  . Chronic systolic CHF (congestive heart failure) ([place])   . Dermatophytosis of the body   . DJD (degenerative joint disease)   . Esophageal reflux   . Hx of colonic polyps   . Hypertension   . LBBB (left bundle branch block)    intermittent  . Non-ischemic cardiomyopathy ([place])    a. cath 2013: minor nonobstructive CAD.  . OSA on CPAP   . Osteoarthrosis, unspecified whether generalized or localized, hand   . Other chronic nonalcoholic liver disease    fatty liver  . PAF (paroxysmal atrial fibrillation) ([place])    a. s/p DCCV 6/11; previously on Pradaxa;  b. Event Monitor 2012->No PAF;  c. 09/2011 s/p DCCV ->Xarelto started. d. 03/2014: inappropriate ICD shocks for AF-RVR, started on Tikosyn, Xarelto restarted.  . Type II diabetes mellitus ([place])   . Unspecified hearing loss    Past Surgical History:  Procedure Laterality Date  . Abdominal US  01/2007   Fatty liver, no gallstones  . APPENDECTOMY    . BI-VENTRICULAR IMPLANTABLE CARDIOVERTER DEFIBRILLATOR [name] 02/14/2013   STJ CRTD implanted by Dr Caryl Comes  . BILATERAL KNEE ARTHROSCOPY    . CARDIOVERSION  09/29/2011   Procedure: CARDIOVERSION;  Surgeon: Thayer Headings, MD;  Location: [facility];  Service: Cardiovascular;  Laterality: [name];  . DOPPLER ECHOCARDIOGRAPHY  11/2009   Decreased EF of 35-40%  . ELECTROPHYSIOLOGIC STUDY [name] 10/14/2015   Procedure:  Atrial Fibrillation Ablation;  Surgeon: [name], MD;  Location: [facility];  Service: Cardiovascular;  Laterality: [name];  . LEFT HEART CATHETERIZATION WITH CORONARY ANGIOGRAM [name] 08/18/2011   Procedure: LEFT HEART CATHETERIZATION WITH CORONARY ANGIOGRAM;  Surgeon: Peter M Martinique, MD;  Location: [facility] CATH LAB;  Service: Cardiovascular;  Laterality: [name];  . NECK SURGERY     plate/fusion  . Stress Cardiolite  08/2005   Normal, EF 42%  . UPPER GASTROINTESTINAL ENDOSCOPY  08/2006   GERD    ROS- all systems are reviewed and negatives except as per HPI above  Current Outpatient Prescriptions  Medication Sig Dispense Refill  . carvedilol (COREG) 25 MG tablet Take 2 tablets (50 mg total) by mouth 2 (two) times daily with a meal. 360 tablet 3  . cetirizine (ZYRTEC) 10 MG tablet Take 10 mg by mouth daily as needed for allergies.    [name] dofetilide (TIKOSYN) 500 MCG capsule TAKE 1 CAPSULE(500 MCG) BY MOUTH TWICE DAILY 180 capsule 3  . glipiZIDE (GLUCOTROL XL) 5 MG 24 hr tablet TAKE 1 TABLET(5 MG) BY MOUTH DAILY WITH BREAKFAST 30 tablet 2  . glucose blood (ONE TOUCH ULTRA TEST) test strip CHECK GLUCOSE TWICE DAILY AND AS NEEDED, DX. E11.65 (UNCONTROLLED DM) 100 each 5  . losartan (COZAAR) 50 MG tablet TAKE 1 TABLET(50 MG) BY MOUTH DAILY 30 tablet 9  . metFORMIN (GLUCOPHAGE) 500 MG tablet TAKE 1 TABLET BY MOUTH TWICE  DAILY WITH A MEAL 60 tablet 11  . Multiple Vitamin (MULTIVITAMIN) tablet Take 1 tablet by mouth daily.      [name] Bicarbonate (ZEGERID OTC) 20-1100 MG CAPS Take 1 capsule by mouth 2 (two) times daily.     . Potassium Chloride ER 20 MEQ TBCR Take 2 tablets by mouth in the AM & 1/2 tablet by mouth in the PM 225 tablet 3  . spironolactone (ALDACTONE) 25 MG tablet TAKE 1 TABLET(25 MG) BY MOUTH DAILY 30 tablet 5  . traMADol (ULTRAM) 50 MG tablet TAKE 1 TABLET BY MOUTH TWICE DAILY AS NEEDED FOR MODERATE PAIN 60 tablet 0  . XARELTO 20 MG TABS tablet TAKE 1 TABLET(20 MG) BY  MOUTH DAILY WITH SUPPER 30 tablet 5   No current facility-administered medications for this visit.     Physical Exam: Vitals:   06/14/16 0933  BP: 124/80  Pulse: 80  SpO2: 97%  Weight: (!) 301 lb 12.8 oz (136.9 kg)  Height: 6\' 4"  (1.93 m)    GEN- The patient is well appearing, alert and oriented x 3 today.   Head- normocephalic, atraumatic Eyes-  Sclera clear, conjunctiva pink Ears- hearing intact Oropharynx- clear Lungs- Clear to ausculation bilaterally, normal work of breathing Heart- Regular rate and rhythm, no murmurs, rubs or gallops, PMI not laterally displaced GI- soft, NT, ND, + BS Extremities- no clubbing, cyanosis, or edema  ekg today reveals sinus rhythm 80 bpm, BiV paced with Qtc 537 bpm  Assessment and Plan:  1. Persistent afib Doing very well post ablation Reluctant to stop tikosyn I think that if his AF remains controlled that it would be very reasonable to stop this medicine.   Continue long term anticoagulation Bmet, Mg today  (will need these at least every 6 months if he remains on tikosyn)  2. Nonischemic CM Normal BIV ICD function See Pace Art report No changes today  Follow-up with Dr Caryl Comes in 6 months for routine device follow-up [place] Follow-up with Dr [name] as scheduled I am happy to see at any time.  [name] MD, [facility] 06/14/2016 10:39 AM

## 2016-06-14 NOTE — Patient Instructions (Signed)
Medication Instructions:  Your physician recommends that you continue on your current medications as directed. Please refer to the Current Medication list given to you today.   Labwork: Your physician recommends that you return for lab work today: CBC/BMP/Mag/   Testing/Procedures: None ordered   Follow-Up: Your physician wants you to follow-up in: 6 months with Dr Gari Crown will receive a reminder letter in the mail two months in advance. If you don't receive a letter, please call our office to schedule the follow-up appointment.   Remote monitoring is used to monitor your  ICD from home. This monitoring reduces the number of office visits required to check your device to one time per year. It allows [place] to keep an eye on the functioning of your device to ensure it is working properly. You are scheduled for a device check from home on 09/13/16. You may send your transmission at any time that day. If you have a wireless device, the transmission will be sent automatically. After your physician reviews your transmission, you will receive a postcard with your next transmission date.    Any Other Special Instructions Will Be Listed Below (If Applicable).     If you need a refill on your cardiac medications before your next appointment, please call your pharmacy.

## 2016-06-16 ENCOUNTER — Other Ambulatory Visit (INDEPENDENT_AMBULATORY_CARE_PROVIDER_SITE_OTHER): Payer: BLUE CROSS/BLUE SHIELD

## 2016-06-16 DIAGNOSIS — R7989 Other specified abnormal findings of blood chemistry: Secondary | ICD-10-CM | POA: Diagnosis not present

## 2016-06-16 DIAGNOSIS — E1165 Type 2 diabetes mellitus with hyperglycemia: Secondary | ICD-10-CM

## 2016-06-16 DIAGNOSIS — IMO0001 Reserved for inherently not codable concepts without codable children: Secondary | ICD-10-CM

## 2016-06-16 DIAGNOSIS — Z125 Encounter for screening for malignant neoplasm of prostate: Secondary | ICD-10-CM

## 2016-06-16 DIAGNOSIS — Z0000 Encounter for general adult medical examination without abnormal findings: Secondary | ICD-10-CM

## 2016-06-16 LAB — COMPREHENSIVE METABOLIC PANEL
ALT: 17 U/L (ref 0–53)
AST: 14 U/L (ref 0–37)
Albumin: 4.4 g/dL (ref 3.5–5.2)
Alkaline Phosphatase: 55 U/L (ref 39–117)
BUN: 17 mg/dL (ref 6–23)
CHLORIDE: 100 meq/L (ref 96–112)
CO2: 29 meq/L (ref 19–32)
Calcium: 9.4 mg/dL (ref 8.4–10.5)
Creatinine, Ser: 0.86 mg/dL (ref 0.40–1.50)
GFR: 98.26 mL/min (ref >60.00)
Glucose, Bld: 229 mg/dL — ABNORMAL HIGH (ref 70–99)
POTASSIUM: 4.1 meq/L (ref 3.5–5.1)
SODIUM: 137 meq/L (ref 135–145)
Total Bilirubin: 0.5 mg/dL (ref 0.2–1.2)
Total Protein: 6.5 g/dL (ref 6.0–8.3)

## 2016-06-16 LAB — LDL CHOLESTEROL, DIRECT: Direct LDL: 97 mg/dL

## 2016-06-16 LAB — LIPID PANEL
CHOL/HDL RATIO: 4
CHOLESTEROL: 160 mg/dL (ref 0–200)
HDL: 39.5 mg/dL (ref >39.00)
NonHDL: 120.42
Triglycerides: 221 mg/dL — ABNORMAL HIGH (ref 0.0–149.0)
VLDL: 44.2 mg/dL — ABNORMAL HIGH (ref 0.0–40.0)

## 2016-06-16 LAB — HEMOGLOBIN A1C: HEMOGLOBIN A1C: 7.8 % — AB (ref 4.6–6.5)

## 2016-06-16 LAB — TSH: TSH: 1.29 u[IU]/mL (ref 0.35–4.50)

## 2016-06-16 LAB — PSA: PSA: 2.12 ng/mL (ref 0.10–4.00)

## 2016-06-23 ENCOUNTER — Encounter: Payer: Self-pay | Admitting: Internal Medicine

## 2016-06-23 ENCOUNTER — Encounter: Payer: Self-pay | Admitting: Family Medicine

## 2016-06-23 ENCOUNTER — Ambulatory Visit (INDEPENDENT_AMBULATORY_CARE_PROVIDER_SITE_OTHER): Payer: BLUE CROSS/BLUE SHIELD | Admitting: Family Medicine

## 2016-06-23 VITALS — BP 126/78 | HR 105 | Temp 98.5°F | Ht 75.25 in | Wt 299.0 lb

## 2016-06-23 DIAGNOSIS — Z0000 Encounter for general adult medical examination without abnormal findings: Secondary | ICD-10-CM

## 2016-06-23 DIAGNOSIS — D126 Benign neoplasm of colon, unspecified: Secondary | ICD-10-CM

## 2016-06-23 DIAGNOSIS — E7800 Pure hypercholesterolemia, unspecified: Secondary | ICD-10-CM | POA: Diagnosis not present

## 2016-06-23 DIAGNOSIS — Z1159 Encounter for screening for other viral diseases: Secondary | ICD-10-CM

## 2016-06-23 DIAGNOSIS — R350 Frequency of micturition: Secondary | ICD-10-CM | POA: Diagnosis not present

## 2016-06-23 DIAGNOSIS — Z114 Encounter for screening for human immunodeficiency virus [HIV]: Secondary | ICD-10-CM

## 2016-06-23 DIAGNOSIS — Z7189 Other specified counseling: Secondary | ICD-10-CM | POA: Insufficient documentation

## 2016-06-23 DIAGNOSIS — K760 Fatty (change of) liver, not elsewhere classified: Secondary | ICD-10-CM

## 2016-06-23 DIAGNOSIS — Z8042 Family history of malignant neoplasm of prostate: Secondary | ICD-10-CM

## 2016-06-23 DIAGNOSIS — Z125 Encounter for screening for malignant neoplasm of prostate: Secondary | ICD-10-CM | POA: Diagnosis not present

## 2016-06-23 DIAGNOSIS — IMO0001 Reserved for inherently not codable concepts without codable children: Secondary | ICD-10-CM

## 2016-06-23 DIAGNOSIS — Z6837 Body mass index (BMI) 37.0-37.9, adult: Secondary | ICD-10-CM

## 2016-06-23 DIAGNOSIS — I480 Paroxysmal atrial fibrillation: Secondary | ICD-10-CM

## 2016-06-23 DIAGNOSIS — I428 Other cardiomyopathies: Secondary | ICD-10-CM

## 2016-06-23 DIAGNOSIS — E1165 Type 2 diabetes mellitus with hyperglycemia: Secondary | ICD-10-CM

## 2016-06-23 DIAGNOSIS — E66812 Obesity, class 2: Secondary | ICD-10-CM

## 2016-06-23 DIAGNOSIS — I5022 Chronic systolic (congestive) heart failure: Secondary | ICD-10-CM

## 2016-06-23 DIAGNOSIS — I10 Essential (primary) hypertension: Secondary | ICD-10-CM

## 2016-06-23 DIAGNOSIS — K635 Polyp of colon: Secondary | ICD-10-CM | POA: Insufficient documentation

## 2016-06-23 MED ORDER — METFORMIN HCL ER 500 MG PO TB24: 1500.0000 mg | ORAL_TABLET | Freq: Every day | ORAL | 11 refills | Status: DC

## 2016-06-23 MED ORDER — GLIPIZIDE ER 5 MG PO TB24: ORAL_TABLET | ORAL | 11 refills | Status: DC

## 2016-06-23 NOTE — Patient Instructions (Addendum)
Start increasing daily walk (when not too hot) - to 30 minutes - then more as tolerated  This will help your conditioning   Do not eat after dinner if you can help it since your glucose levels are highest in the am Stick to a diabetic diet Start working on weight loss   Get your diabetic eye exam -you are over due (and make sure we get a copy)   If you are interested in the new shingles vaccine (Shingrix) - call your insurance to check on coverage,( you should not get it within 1 month of other vaccines) , then call us for a prescription  for it to take to a pharmacy that gives the shot , or make a nurse visit to get it here depending on your coverage   Stop your metformin and start metformin xr  - it should not give you diarrhea like the regular but if it does let us know   We will refer you for colonoscopy  We will refer you to urology to follow prostate   Wear your sunscreen to work outdoors   Screening tests for Hepatitis C and HIV today

## 2016-06-23 NOTE — Progress Notes (Signed)
Subjective:    Patient ID: Aaron Thompson, male    DOB: 1960-12-20, 56 y.o.   MRN: [id number]  HPI Here for health maintenance exam and to review chronic medical problems   He is able to do what he wants now with heart function  Helping his brother for 1/2 days - was hanging sheet rock / has to take frequent breaks however   Wt Readings from Last 3 Encounters:  06/23/16 299 lb (135.6 kg)  06/14/16 (!) 301 lb 12.8 oz (136.9 kg)  03/19/16 299 lb 12.8 oz (136 kg)  no regular exercise program - but walks in the afternoons with wife for 15 minutes  Eating too much in general (appetite came back after heart procedure)  Eats out too much  bmi 37.1  Hep C/HIV screening - is interested in screening   Eye exam - 2 y ago , he will schedule that   Colonoscopy 8/08- serrated adenoma  Due for a colonoscopy  Tetanus shot 6/17  Shingles status - interested in shingrix if covered   Prostate cancer screening Lab Results  Component Value Date   PSA 2.12 06/16/2016   nocturia times 3 Slower flow  Brother has prostate cancer and father had it in older age     Hx of a fib and CHF A fib is controlled post ablation- feels a lot better  Has ICD No changes in nonischemic CM per last cardiology note    bp is stable today  No cp or palpitations or headaches or edema  No side effects to medicines  BP Readings from Last 3 Encounters:  06/23/16 126/78  06/14/16 124/80  03/19/16 110/80     Hx of fatty liver   Chemistry      Component Value Date/Time   NA 137 06/16/2016 0755   NA 139 06/14/2016 1108   K 4.1 06/16/2016 0755   CL 100 06/16/2016 0755   CO2 29 06/16/2016 0755   BUN 17 06/16/2016 0755   BUN 15 06/14/2016 1108   CREATININE 0.86 06/16/2016 0755   CREATININE 0.77 10/01/2015 1202      Component Value Date/Time   CALCIUM 9.4 06/16/2016 0755   ALKPHOS 55 06/16/2016 0755   AST 14 06/16/2016 0755   ALT 17 06/16/2016 0755   BILITOT 0.5 06/16/2016 0755      Hx of  DM2 Lab Results  Component Value Date   HGBA1C 7.8 (H) 06/16/2016  up from 7.1 He has elevated glucose in am  Metformin 500 bid - diarrhea if more  Is willing to change to xr and see if that works better Also glipizide er 5     Hx of hyperlipidemia Lab Results  Component Value Date   CHOL 160 06/16/2016   CHOL 149 04/07/2015   CHOL 150 03/17/2015   Lab Results  Component Value Date   HDL 39.50 06/16/2016   HDL 29.70 (L) 04/07/2015   HDL 34 (L) 03/17/2015   Lab Results  Component Value Date   LDLCALC 94 04/07/2015   LDLCALC 88 03/17/2015   LDLCALC 104 (H) 12/03/2014   Lab Results  Component Value Date   TRIG 221.0 (H) 06/16/2016   TRIG 129.0 04/07/2015   TRIG 140 03/17/2015   Lab Results  Component Value Date   CHOLHDL 4 06/16/2016   CHOLHDL 5 04/07/2015   CHOLHDL 4.4 03/17/2015   Lab Results  Component Value Date   LDLDIRECT 97.0 06/16/2016   LDLDIRECT 109.1 02/01/2013    LDL and  trig up slt  HDL up also due to being more active   Lab Results  Component Value Date   TSH 1.29 06/16/2016   Lab Results  Component Value Date   WBC 7.5 06/14/2016   HGB 14.6 10/01/2015   HCT 43.4 06/14/2016   MCV 87 06/14/2016   PLT 186 06/14/2016     Patient Active Problem List   Diagnosis Date Noted  . Family history of prostate cancer 06/23/2016  . Frequent urination 06/23/2016  . Encounter for hepatitis C screening test for low risk patient 06/23/2016  . Encounter for screening for HIV 06/23/2016  . Colon polyps 06/23/2016  . Laboratory examination ordered as part of a routine general medical examination 03/08/2016  . Routine general medical examination at a health care facility 03/08/2016  . Prostate cancer screening 03/08/2016  . A-fib ([place]) 10/14/2015  . Hypokalemia 08/12/2015  . Inappropriate shocks from ICD (implantable cardioverter-defibrillator) 03/17/2015  . Hearing loss of right ear due to cerumen impaction 03/13/2015  . Obesity 12/05/2014  .  Osteoarthritis of both hands 07/31/2014  . Low serum potassium level 04/21/2014  . ICD (implantable cardioverter-defibrillator) discharge 04/19/2014  . Chronic systolic heart failure ([place]) 02/14/2013  . Hyperlipidemia 02/05/2013  . Diabetes type 2, uncontrolled ([place]) 09/30/2011  . Nonischemic cardiomyopathy ([place]) 08/18/2011  . Chest pain on exertion 08/17/2011  . Type II or unspecified type diabetes mellitus without mention of complication, not stated as uncontrolled 10/06/2010  . Palpitations 05/12/2010  . CARDIOMYOPATHY OTHER DISEASES CLASSIFIED ELSW 07/16/2009  . OSTEOARTHROSIS UNSPEC WHETHER GEN/LOCALIZED HAND 08/16/2008  . Essential hypertension 05/11/2007  . History of renal calculi 05/11/2007  . Fatty liver 03/20/2007  . ANXIETY 10/04/2006  . HEARING IMPAIRMENT 10/04/2006  . FOLLICULITIS 14/97/0263  . GERD 09/20/2006   Past Medical History:  Diagnosis Date  . Anxiety state, unspecified   . Calculus of kidney 1981   "passed it on my own"  . Chronic systolic CHF (congestive heart failure) ([place])   . Dermatophytosis of the body   . DJD (degenerative joint disease)   . Esophageal reflux   . Hx of colonic polyps   . Hypertension   . LBBB (left bundle branch block)    intermittent  . Non-ischemic cardiomyopathy ([place])    a. cath 2013: minor nonobstructive CAD.  . OSA on CPAP   . Osteoarthrosis, unspecified whether generalized or localized, hand   . Other chronic nonalcoholic liver disease    fatty liver  . PAF (paroxysmal atrial fibrillation) ([place])    a. s/p DCCV 6/11; previously on Pradaxa;  b. Event Monitor 2012->No PAF;  c. 09/2011 s/p DCCV ->Xarelto started. d. 03/2014: inappropriate ICD shocks for AF-RVR, started on Tikosyn, Xarelto restarted.  . Type II diabetes mellitus ([place])   . Unspecified hearing loss    Past Surgical History:  Procedure Laterality Date  . Abdominal US  01/2007   Fatty liver, no gallstones  . APPENDECTOMY    . BI-VENTRICULAR IMPLANTABLE  CARDIOVERTER DEFIBRILLATOR [name] 02/14/2013   STJ CRTD implanted by Dr Caryl Comes  . BILATERAL KNEE ARTHROSCOPY    . CARDIOVERSION  09/29/2011   Procedure: CARDIOVERSION;  Surgeon: Thayer Headings, MD;  Location: [facility];  Service: Cardiovascular;  Laterality: [name];  . DOPPLER ECHOCARDIOGRAPHY  11/2009   Decreased EF of 35-40%  . ELECTROPHYSIOLOGIC STUDY [name] 10/14/2015   Procedure: Atrial Fibrillation Ablation;  Surgeon: [name], MD;  Location: [facility];  Service: Cardiovascular;  Laterality: [name];  . LEFT HEART CATHETERIZATION  WITH CORONARY ANGIOGRAM [name] 08/18/2011   Procedure: LEFT HEART CATHETERIZATION WITH CORONARY ANGIOGRAM;  Surgeon: Peter M Martinique, MD;  Location: [facility] CATH LAB;  Service: Cardiovascular;  Laterality: [name];  . NECK SURGERY     plate/fusion  . Stress Cardiolite  08/2005   Normal, EF 42%  . UPPER GASTROINTESTINAL ENDOSCOPY  08/2006   GERD   Social History  Substance Use Topics  . Smoking status: Never Smoker  . Smokeless tobacco: Former Systems developer    Types: Snuff     Comment: 02/14/2013 "quit snuff in 2006"  . Alcohol use No   Family History  Problem Relation Age of Onset  . Hypertension Mother   . Diabetes Mother   . Heart failure Mother        Died @ 73  . Hypertension Father   . Diabetes Father   . Heart failure Father        Died @ 27  . Diabetes Brother   . Kidney disease Brother   . Diabetes Brother   . Other Sister        4 sisters A&W  . Heart attack Paternal Grandfather   . Stroke Paternal Grandmother   . Multiple myeloma Sister   . Leukemia Sister    No Known Allergies Current Outpatient Prescriptions on File Prior to Visit  Medication Sig Dispense Refill  . carvedilol (COREG) 25 MG tablet Take 2 tablets (50 mg total) by mouth 2 (two) times daily with a meal. 360 tablet 3  . cetirizine (ZYRTEC) 10 MG tablet Take 10 mg by mouth daily as needed for allergies.    [name] dofetilide (TIKOSYN) 500 MCG capsule TAKE 1 CAPSULE(500 MCG) BY MOUTH TWICE DAILY 180  capsule 3  . glucose blood (ONE TOUCH ULTRA TEST) test strip CHECK GLUCOSE TWICE DAILY AND AS NEEDED, DX. E11.65 (UNCONTROLLED DM) 100 each 5  . losartan (COZAAR) 50 MG tablet TAKE 1 TABLET(50 MG) BY MOUTH DAILY 30 tablet 9  . Multiple Vitamin (MULTIVITAMIN) tablet Take 1 tablet by mouth daily.      [name] Bicarbonate (ZEGERID OTC) 20-1100 MG CAPS Take 1 capsule by mouth 2 (two) times daily.     . Potassium Chloride ER 20 MEQ TBCR Take 2 tablets by mouth in the AM & 1/2 tablet by mouth in the PM 225 tablet 3  . spironolactone (ALDACTONE) 25 MG tablet TAKE 1 TABLET(25 MG) BY MOUTH DAILY 30 tablet 5  . traMADol (ULTRAM) 50 MG tablet TAKE 1 TABLET BY MOUTH TWICE DAILY AS NEEDED FOR MODERATE PAIN 60 tablet 0  . XARELTO 20 MG TABS tablet TAKE 1 TABLET(20 MG) BY MOUTH DAILY WITH SUPPER 30 tablet 5   No current facility-administered medications on file prior to visit.     Review of Systems Review of Systems  Constitutional: Negative for fever, appetite change, fatigue and unexpected weight change.  Eyes: Negative for pain and visual disturbance.  Respiratory: Negative for cough and shortness of breath.   Cardiovascular: Negative for cp or palpitations    Gastrointestinal: Negative for nausea, diarrhea and constipation.  Genitourinary: pos for urgency and frequency. neg for dysuria  Skin: Negative for pallor or rash   Neurological: Negative for weakness, light-headedness, numbness and headaches.  Hematological: Negative for adenopathy. Does not bruise/bleed easily.  Psychiatric/Behavioral: Negative for dysphoric mood. The patient is not nervous/anxious.         Objective:   Physical Exam  Constitutional: He appears well-developed and well-nourished. No distress.  obese and well  appearing   HENT:  Head: Normocephalic and atraumatic.  Right Ear: External ear normal.  Left Ear: External ear normal.  Nose: Nose normal.  Mouth/Throat: Oropharynx is clear and moist.  Hearing imp    Eyes: Conjunctivae and EOM are normal. Pupils are equal, round, and reactive to light. Right eye exhibits no discharge. Left eye exhibits no discharge. No scleral icterus.  Baseline mild strabismus   Neck: Normal range of motion. Neck supple. No JVD present. Carotid bruit is not present. No thyromegaly present.  Cardiovascular: Normal rate, regular rhythm, normal heart sounds and intact distal pulses.  Exam reveals no gallop.   Pulmonary/Chest: Effort normal and breath sounds normal. No respiratory distress. He has no wheezes. He exhibits no tenderness.  Abdominal: Soft. Bowel sounds are normal. He exhibits no distension, no abdominal bruit and no mass. There is no tenderness.  Musculoskeletal: He exhibits no edema or tenderness.  Lymphadenopathy:    He has no cervical adenopathy.  Neurological: He is alert. He has normal reflexes. No cranial nerve deficit. He exhibits normal muscle tone. Coordination normal.  Skin: Skin is warm and dry. No rash noted. No erythema. No pallor.  Solar lentigines diffusely   Psychiatric: He has a normal mood and affect.  Pleasant and talkative          Assessment & Plan:   Problem List Items Addressed This Visit      Cardiovascular and Mediastinum   A-fib ([place])    No re occurrence since his ablation  May be able to stop tikosyn in the future      Chronic systolic heart failure ([place])    Doing well with cardiology f/u s/p ablation  Has ICD      Essential hypertension - Primary    bp in fair control at this time  BP Readings from Last 1 Encounters:  06/23/16 126/78   No changes needed Disc lifstyle change with low sodium diet and exercise  Labs reviewed       Nonischemic cardiomyopathy ([place])    Doing well with cardiac f/u        Digestive   Colon polyps    Due for colonoscopy- this will be scheduled       Relevant Orders   Ambulatory referral to Gastroenterology   Fatty liver    Liver tests are normal  Counseled on diet and  exercise  Continue to follow        Endocrine   Diabetes type 2, uncontrolled ([place])    Lab Results  Component Value Date   HGBA1C 7.8 (H) 06/16/2016   Worse with dietary indiscretion He will work on this and exercise now that he feels better  Also change to metformin xr (1500 mg daily) and see if he tolerates this better/gi  F/u 3 mo       Relevant Medications   metFORMIN (GLUCOPHAGE XR) 500 MG 24 hr tablet   glipiZIDE (GLUCOTROL XL) 5 MG 24 hr tablet     Other   Encounter for hepatitis C screening test for low risk patient    Screen today      Relevant Orders   Hepatitis C antibody (Completed)   Encounter for screening for HIV    Screen today      Relevant Orders   HIV antibody (with reflex) (Completed)   Family history of prostate cancer    Brother-early/father late life  Some bph symptoms  Ref to [employer]  Component Value Date   PSA  2.12 06/16/2016         Relevant Orders   Ambulatory referral to Urology   Frequent urination    Suspect prostate related Also poss due to hyperglycemia Strong fam hx of prostate cancer  Ref to urol      Relevant Orders   Ambulatory referral to Urology   Hyperlipidemia    Disc goals for lipids and reasons to control them Rev labs with pt Rev low sat fat diet in detail  Fair conrol with diet  LDL 94 Enc exercise for HDL  DM diet for trig  Sees cardiology      Obesity    Discussed how this problem influences overall health and the risks it imposes  Reviewed plan for weight loss with lower calorie diet (via better food choices and also portion control or program like weight watchers) and exercise building up to or more than 30 minutes 5 days per week including some aerobic activity         Relevant Medications   metFORMIN (GLUCOPHAGE XR) 500 MG 24 hr tablet   glipiZIDE (GLUCOTROL XL) 5 MG 24 hr tablet   Prostate cancer screening    Lab Results  Component Value Date   PSA 2.12 06/16/2016   Given  strong fam hx and symptoms-ref to urology to follow      Routine general medical examination at a health care facility    Reviewed health habits including diet and exercise and skin cancer prevention Reviewed appropriate screening tests for age  Also reviewed health mt list, fam hx and immunization status , as well as social and family history   See HPI Labs rev  Ref to urology for prostate surveillance  Ref to GI for colonoscopy  Enc diet/exercise  He will look into shingrix vaccine

## 2016-06-24 LAB — HIV 1/2 CONFIRMATION
HIV 1 ANTIBODY: NEGATIVE
HIV 2 AB: NEGATIVE

## 2016-06-24 LAB — HIV ANTIBODY (ROUTINE TESTING W REFLEX): HIV 1&2 Ab, 4th Generation: REACTIVE — AB

## 2016-06-24 LAB — HEPATITIS C ANTIBODY: HCV AB: NEGATIVE

## 2016-06-24 NOTE — Assessment & Plan Note (Addendum)
Suspect prostate related Also poss due to hyperglycemia Strong fam hx of prostate cancer  Ref to [facility]

## 2016-06-24 NOTE — Assessment & Plan Note (Signed)
bp in fair control at this time  BP Readings from Last 1 Encounters:  06/23/16 126/78   No changes needed Disc lifstyle change with low sodium diet and exercise  Labs reviewed

## 2016-06-24 NOTE — Assessment & Plan Note (Signed)
Due for colonoscopy- this will be scheduled

## 2016-06-24 NOTE — Assessment & Plan Note (Signed)
Lab Results  Component Value Date   PSA 2.12 06/16/2016   Given strong fam hx and symptoms-ref to urology to follow

## 2016-06-24 NOTE — Assessment & Plan Note (Signed)
Doing well with cardiology f/u s/p ablation  Has ICD

## 2016-06-24 NOTE — Assessment & Plan Note (Signed)
Liver tests are normal  Counseled on diet and exercise  Continue to follow

## 2016-06-24 NOTE — Assessment & Plan Note (Signed)
No re occurrence since his ablation  May be able to stop tikosyn in the future

## 2016-06-24 NOTE — Assessment & Plan Note (Signed)
Screen today 

## 2016-06-24 NOTE — Assessment & Plan Note (Signed)
Doing well with cardiac f/u

## 2016-06-24 NOTE — Assessment & Plan Note (Signed)
Reviewed health habits including diet and exercise and skin cancer prevention Reviewed appropriate screening tests for age  Also reviewed health mt list, fam hx and immunization status , as well as social and family history   See HPI Labs rev  Ref to urology for prostate surveillance  Ref to GI for colonoscopy  Enc diet/exercise  He will look into shingrix vaccine

## 2016-06-24 NOTE — Assessment & Plan Note (Signed)
Lab Results  Component Value Date   HGBA1C 7.8 (H) 06/16/2016   Worse with dietary indiscretion He will work on this and exercise now that he feels better  Also change to metformin xr (1500 mg daily) and see if he tolerates this better/gi  F/u 3 mo

## 2016-06-24 NOTE — Assessment & Plan Note (Signed)
Brother-early/father late life  Some bph symptoms  Ref to urol Lab Results  Component Value Date   PSA 2.12 06/16/2016

## 2016-06-24 NOTE — Assessment & Plan Note (Signed)
Discussed how this problem influences overall health and the risks it imposes  Reviewed plan for weight loss with lower calorie diet (via better food choices and also portion control or program like weight watchers) and exercise building up to or more than 30 minutes 5 days per week including some aerobic activity    

## 2016-06-24 NOTE — Assessment & Plan Note (Signed)
Disc goals for lipids and reasons to control them Rev labs with pt Rev low sat fat diet in detail  Fair conrol with diet  LDL 94 Enc exercise for HDL  DM diet for [facility] cardiology

## 2016-07-05 ENCOUNTER — Telehealth: Payer: Self-pay | Admitting: Cardiology

## 2016-07-05 ENCOUNTER — Telehealth: Payer: Self-pay | Admitting: *Deleted

## 2016-07-05 LAB — HIV-1 RNA, QUALITATIVE, TMA

## 2016-07-05 NOTE — Telephone Encounter (Signed)
Spoke w/ pt and requested that he send a manual transmission b/c his home monitor has not updated in at least 7 days.   

## 2016-07-05 NOTE — Telephone Encounter (Signed)
[facility] called, they did not have enough specimen to run the HIV qualitative test. Pt will need to be redrawn if you want that test done.

## 2016-07-06 ENCOUNTER — Other Ambulatory Visit: Payer: Self-pay | Admitting: Family Medicine

## 2016-07-06 DIAGNOSIS — Z114 Encounter for screening for human immunodeficiency virus [HIV]: Secondary | ICD-10-CM

## 2016-07-06 NOTE — Telephone Encounter (Signed)
Please re draw him- tell him that the lab had issues completing the test and we need to draw again (sorry about that) I do not want to give him any results until that is done  Thanks   (it was the 3rd part of the test -qualitative , that they did not have enough serum to do that part)

## 2016-07-06 NOTE — Telephone Encounter (Signed)
Pt notified he says he's seeing Dr. Lorelei Pont tomorrow, and will come by the lab then.

## 2016-07-07 ENCOUNTER — Ambulatory Visit (INDEPENDENT_AMBULATORY_CARE_PROVIDER_SITE_OTHER)
Admission: RE | Admit: 2016-07-07 | Discharge: 2016-07-07 | Disposition: A | Discharge status: Home / Self Care | Payer: BLUE CROSS/BLUE SHIELD | Source: Ambulatory Visit | Attending: Family Medicine | Admitting: Family Medicine

## 2016-07-07 ENCOUNTER — Encounter: Payer: Self-pay | Admitting: Family Medicine

## 2016-07-07 ENCOUNTER — Ambulatory Visit (INDEPENDENT_AMBULATORY_CARE_PROVIDER_SITE_OTHER): Payer: BLUE CROSS/BLUE SHIELD | Admitting: Family Medicine

## 2016-07-07 VITALS — BP 100/70 | HR 91 | Temp 99.1°F | Ht 75.25 in | Wt 304.5 lb

## 2016-07-07 DIAGNOSIS — Z981 Arthrodesis status: Secondary | ICD-10-CM

## 2016-07-07 DIAGNOSIS — M5412 Radiculopathy, cervical region: Secondary | ICD-10-CM

## 2016-07-07 DIAGNOSIS — T84216A Breakdown (mechanical) of internal fixation device of vertebrae, initial encounter: Secondary | ICD-10-CM

## 2016-07-07 DIAGNOSIS — Z114 Encounter for screening for human immunodeficiency virus [HIV]: Secondary | ICD-10-CM | POA: Diagnosis not present

## 2016-07-07 DIAGNOSIS — M7582 Other shoulder lesions, left shoulder: Secondary | ICD-10-CM

## 2016-07-07 MED ORDER — METHYLPREDNISOLONE ACETATE 40 MG/ML IJ SUSP
80.0000 mg | Freq: Once | INTRAMUSCULAR | Status: AC
  Administered 2016-07-07: 80 mg via INTRA_ARTICULAR

## 2016-07-07 NOTE — Progress Notes (Addendum)
Dr. Frederico Hamman T. Arielys Wandersee, MD, [facility] [place], [address] Phone: [phone] Fax: [phone]  07/07/2016  Patient: Aaron Thompson, MRN: [id number], DOB: [birth year], 56 y.o.  Primary Physician:  Tower, Wynelle Fanny, MD   Chief Complaint  Patient presents with  . Shoulder Pain    Left  . Numbness    down arm   Subjective:   Aaron Thompson is a 56 y.o. very pleasant male patient who presents with the following:  L shoulder pain and numbness down the arm:   Has been ongoing for at leat a month. Will get some pain and numbness down his left arm. Upper arm and down into the arm, too. Abd and IROM hurts a lot, too. He is having pain with abduction as well as internal range of motion.  Had a cervical fusion, Dr. Carloyn Manner. At least 10 years. His prior fusion was levels C5-7. His history is also significant for significant heart failure: With an EF less than 20%.  He does have a defibrillator in place. He is chronically on Xarelto.  Spurling's is positive.   Past Medical History, Surgical History, Social History, Family History, Problem List, Medications, and Allergies have been reviewed and updated if relevant.  Patient Active Problem List   Diagnosis Date Noted  . Family history of prostate cancer 06/23/2016  . Frequent urination 06/23/2016  . Encounter for hepatitis C screening test for low risk patient 06/23/2016  . Encounter for screening for HIV 06/23/2016  . Colon polyps 06/23/2016  . Laboratory examination ordered as part of a routine general medical examination 03/08/2016  . Routine general medical examination at a health care facility 03/08/2016  . Prostate cancer screening 03/08/2016  . A-fib ([place]) 10/14/2015  . Hypokalemia 08/12/2015  . Inappropriate shocks from ICD (implantable cardioverter-defibrillator) 03/17/2015  . Hearing loss of right ear due to cerumen impaction 03/13/2015  . Obesity 12/05/2014  .  Osteoarthritis of both hands 07/31/2014  . Low serum potassium level 04/21/2014  . ICD (implantable cardioverter-defibrillator) discharge 04/19/2014  . Chronic systolic heart failure ([place]) 02/14/2013  . Hyperlipidemia 02/05/2013  . Diabetes type 2, uncontrolled ([place]) 09/30/2011  . Nonischemic cardiomyopathy ([place]) 08/18/2011  . Chest pain on exertion 08/17/2011  . Type II or unspecified type diabetes mellitus without mention of complication, not stated as uncontrolled 10/06/2010  . Palpitations 05/12/2010  . CARDIOMYOPATHY OTHER DISEASES CLASSIFIED ELSW 07/16/2009  . OSTEOARTHROSIS UNSPEC WHETHER GEN/LOCALIZED HAND 08/16/2008  . Essential hypertension 05/11/2007  . History of renal calculi 05/11/2007  . Fatty liver 03/20/2007  . ANXIETY 10/04/2006  . HEARING IMPAIRMENT 10/04/2006  . FOLLICULITIS 28/76/8115  . GERD 09/20/2006    Past Medical History:  Diagnosis Date  . Anxiety state, unspecified   . Calculus of kidney 1981   "passed it on my own"  . Chronic systolic CHF (congestive heart failure) ([place])   . Dermatophytosis of the body   . DJD (degenerative joint disease)   . Esophageal reflux   . Hx of colonic polyps   . Hypertension   . LBBB (left bundle branch block)    intermittent  . Non-ischemic cardiomyopathy ([place])    a. cath 2013: minor nonobstructive CAD.  . OSA on CPAP   . Osteoarthrosis, unspecified whether generalized or localized, hand   . Other chronic nonalcoholic liver disease    fatty liver  . PAF (paroxysmal atrial fibrillation) ([place])    a. s/p DCCV 6/11; previously on  Pradaxa;  b. Event Monitor 2012->No PAF;  c. 09/2011 s/p DCCV ->Xarelto started. d. 03/2014: inappropriate ICD shocks for AF-RVR, started on Tikosyn, Xarelto restarted.  . Type II diabetes mellitus ([place])   . Unspecified hearing loss     Past Surgical History:  Procedure Laterality Date  . Abdominal US  01/2007   Fatty liver, no gallstones  . APPENDECTOMY    . BI-VENTRICULAR IMPLANTABLE  CARDIOVERTER DEFIBRILLATOR [name] 02/14/2013   STJ CRTD implanted by Dr Caryl Comes  . BILATERAL KNEE ARTHROSCOPY    . CARDIOVERSION  09/29/2011   Procedure: CARDIOVERSION;  Surgeon: Thayer Headings, MD;  Location: [facility];  Service: Cardiovascular;  Laterality: [name];  . DOPPLER ECHOCARDIOGRAPHY  11/2009   Decreased EF of 35-40%  . ELECTROPHYSIOLOGIC STUDY [name] 10/14/2015   Procedure: Atrial Fibrillation Ablation;  Surgeon: [name], MD;  Location: [facility];  Service: Cardiovascular;  Laterality: [name];  . LEFT HEART CATHETERIZATION WITH CORONARY ANGIOGRAM [name] 08/18/2011   Procedure: LEFT HEART CATHETERIZATION WITH CORONARY ANGIOGRAM;  Surgeon: Peter M Martinique, MD;  Location: [facility] CATH LAB;  Service: Cardiovascular;  Laterality: [name];  . NECK SURGERY     plate/fusion  . Stress Cardiolite  08/2005   Normal, EF 42%  . UPPER GASTROINTESTINAL ENDOSCOPY  08/2006   GERD    Social History   Social History  . Marital status: Married    Spouse name: [name]  . Number of children: 2  . Years of education: [name]   Occupational History  . PAINTER Unemployed   Social History Main Topics  . Smoking status: Never Smoker  . Smokeless tobacco: Former Systems developer    Types: Snuff     Comment: 02/14/2013 "quit snuff in 2006"  . Alcohol use No  . Drug use: No  . Sexual activity: Yes   Other Topics Concern  . Not on file   Social History Narrative   Lives in [place] with wife.  Works @ [employer] in [employer].  Not routinely exercising.    Family History  Problem Relation Age of Onset  . Hypertension Mother   . Diabetes Mother   . Heart failure Mother        Died @ 59  . Hypertension Father   . Diabetes Father   . Heart failure Father        Died @ 49  . Diabetes Brother   . Kidney disease Brother   . Diabetes Brother   . Other Sister        4 sisters A&W  . Heart attack Paternal Grandfather   . Stroke Paternal Grandmother   . Multiple myeloma Sister   . Leukemia Sister     No Known  Allergies  Medication list reviewed and updated in full in [place].  GEN: No fevers, chills. Nontoxic. Primarily MSK c/o today. MSK: Detailed in the HPI GI: tolerating PO intake without difficulty Neuro: No numbness, parasthesias, or tingling associated. Otherwise the pertinent positives of the ROS are noted above.   Objective:   BP 100/70   Pulse 91   Temp 99.1 F (37.3 C) (Oral)   Ht 6' 3.25" (1.911 m)   Wt (!) 304 lb 8 oz (138.1 kg)   BMI 37.81 kg/m    GEN: Well-developed,well-nourished,in no acute distress; alert,appropriate and cooperative throughout examination HEENT: Normocephalic and atraumatic without obvious abnormalities. Ears, externally no deformities PULM: Breathing comfortably in no respiratory distress EXT: No clubbing, cyanosis, or edema PSYCH: Normally interactive. Cooperative during the  interview. Pleasant. Friendly and conversant. Not anxious or depressed appearing. Normal, full affect.  Shoulder: L Inspection: No muscle wasting or winging Ecchymosis/edema: neg  AC joint, scapula, clavicle: NT Cervical spine: 10% loss of motion Spurling's: + down L arm Abduction: full, 4+/5 - painful arc of motion Flexion: full, 5/5 IR, full, lift-off: 5/5 ER at neutral: full, 5/5 AC crossover: mild pos Neer: mild pos Hawkins: pos Drop Test: neg Empty Can: pos Supraspinatus insertion: mild-mod T Bicipital groove: NT Speed's: neg Yergason's: neg Sulcus sign: neg Scapular dyskinesis: none C5-T1 intact  Neuro: Sensation intact Grip 5/5   Radiology: Dg Cervical Spine Complete  Result Date: 07/07/2016 CLINICAL DATA:  Neck pain and left radiculopathy. EXAM: CERVICAL SPINE - COMPLETE 4+ VIEW COMPARISON:  None. FINDINGS: There is no evidence of cervical spine fracture or prevertebral soft tissue swelling. There is straightening of cervical lordosis. Prior C5 through C7 anterior fusion. Possible fracture of C7 screws, only seen on 1 technically suboptimal  view. Lucency surrounds the C5 screws. Multilevel osteoarthritic changes of the cervical spine. Moderate posterior facet arthropathy. IMPRESSION: No evidence of acute fracture. Straightening of the cervical lordosis. Prior C5 through C7 anterior spinal fusion. Possible fracture of C7 screws, inconclusive, as seen on only 1 suboptimal lateral view. Lucency around C5 screws, which may be associated with hardware loosening. Multilevel osteoarthritic changes of the cervical spine. Electronically Signed   By: Fidela Salisbury M.D.   On: 07/07/2016 15:48   Dg Shoulder Left  Result Date: 07/07/2016 CLINICAL DATA:  Left shoulder pain for 1 month post trauma. EXAM: LEFT SHOULDER - 2+ VIEW COMPARISON:  None. FINDINGS: There is no evidence of fracture or dislocation. There is no evidence of arthropathy or other focal bone abnormality. Cardiac pacemaker apparatus overlies the chest wall. Soft tissues are otherwise unremarkable. IMPRESSION: Negative. Electronically Signed   By: Fidela Salisbury M.D.   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  We also reviewed and do a subacromial injection today for the patient.  The neck issue is more concerning with radicular pain and numbness down his arm with questionable loosening of his hardware or possible fracture of  some of his screws.  I'm going to have to verbally discussed this with the patient myself and, with an additional plan of care.  Addendum: 07/08/2016 At this point given x-ray results as well as clinical findings with possible/probable at least partial hardware failure, I'm going to obtain a CT of the patient's c-spine without contrast to greater assess the osseous findings as well as hardware.  Patient does have a implantable defibrillator, and he is unable to obtain an MRI.  SubAC Injection, LEFT Verbal consent was obtained from the patient. Risks (including rare infection), benefits, and alternatives were explained. Patient prepped with Chloraprep and Ethyl Chloride used for anesthesia. The subacromial space was injected using the posterior approach. The patient tolerated the procedure well and had decreased pain  post injection. No complications. Injection: 8 cc of Lidocaine 1% and 2 mL of Depo-Medrol 40 mg. Needle: 22 gauge   Follow-up: No Follow-up on file.  Future Appointments Date Time Provider [place]  07/26/2016 3:00 PM Irene Shipper, MD [facility]  08/04/2016 2:15 PM Owens Loffler, MD [facility]  09/13/2016 9:05 AM [facility] DEVICE REMOTES [facility]  12/14/2016 1:45 PM Deboraha Sprang, MD [facility]    Meds ordered this encounter  Medications  . methylPREDNISolone acetate (DEPO-MEDROL) injection 80 mg   There are no discontinued medications. Orders Placed This Encounter  Procedures  . DG Shoulder Left  . DG Cervical Spine Complete  . Ambulatory referral to Physical Therapy    Signed,  Frederico Hamman T. Darran Gabay, MD   Allergies as of 07/07/2016   No Known Allergies     Medication List       Accurate as of 07/07/16 11:59 PM. Always use your most recent med list.          carvedilol 25 MG tablet Commonly known as:  COREG Take 2 tablets (50 mg total) by mouth 2 (two) times daily with a meal.   cetirizine 10 MG  tablet Commonly known as:  ZYRTEC Take 10 mg by mouth daily as needed for allergies.   dofetilide 500 MCG capsule Commonly known as:  TIKOSYN TAKE 1 CAPSULE(500 MCG) BY MOUTH TWICE DAILY   glipiZIDE 5 MG 24 hr tablet Commonly known as:  GLUCOTROL XL TAKE 1 TABLET(5 MG) BY MOUTH DAILY WITH BREAKFAST   glucose blood test strip Commonly known as:  ONE TOUCH ULTRA TEST CHECK GLUCOSE TWICE DAILY AND AS NEEDED, DX. E11.65 (UNCONTROLLED DM)   losartan 50 MG tablet Commonly known as:  COZAAR TAKE 1 TABLET(50 MG) BY MOUTH DAILY   metFORMIN 500 MG 24 hr tablet Commonly known as:  GLUCOPHAGE XR Take 3 tablets (1,500 mg total) by mouth daily with breakfast.   multivitamin tablet Take 1 tablet by mouth daily.   Potassium Chloride ER 20 MEQ Tbcr Take 2 tablets by mouth in the AM & 1/2 tablet by mouth in the PM   spironolactone 25 MG tablet Commonly known as:  ALDACTONE TAKE 1 TABLET(25 MG) BY MOUTH DAILY   traMADol 50 MG tablet Commonly known as:  ULTRAM TAKE 1 TABLET BY MOUTH TWICE DAILY AS NEEDED FOR MODERATE PAIN   XARELTO 20 MG Tabs tablet Generic drug:  rivaroxaban TAKE 1 TABLET(20 MG) BY MOUTH DAILY WITH SUPPER   ZEGERID OTC 20-1100 MG Caps capsule Generic drug:  Omeprazole-Sodium Bicarbonate Take 1 capsule by mouth 2 (two) times daily.

## 2016-07-07 NOTE — Patient Instructions (Signed)

## 2016-07-08 ENCOUNTER — Telehealth: Payer: Self-pay | Admitting: Family Medicine

## 2016-07-08 NOTE — Addendum Note (Signed)
Addended by: Owens Loffler on: 07/08/2016 10:28 AM   Modules accepted: Orders

## 2016-07-08 NOTE — Telephone Encounter (Signed)
Patient returned Dr.Copland's call about his results.  Patient said the call said restricted and he didn't take the call.  Patient said he will answer this time.

## 2016-07-08 NOTE — Telephone Encounter (Signed)
Already spoke to him, CT ordered.

## 2016-07-12 ENCOUNTER — Ambulatory Visit (INDEPENDENT_AMBULATORY_CARE_PROVIDER_SITE_OTHER)
Admission: RE | Admit: 2016-07-12 | Discharge: 2016-07-12 | Disposition: A | Discharge status: Home / Self Care | Payer: BLUE CROSS/BLUE SHIELD | Source: Ambulatory Visit | Attending: Family Medicine | Admitting: Family Medicine

## 2016-07-12 DIAGNOSIS — T84216A Breakdown (mechanical) of internal fixation device of vertebrae, initial encounter: Secondary | ICD-10-CM

## 2016-07-12 DIAGNOSIS — M5412 Radiculopathy, cervical region: Secondary | ICD-10-CM | POA: Diagnosis not present

## 2016-07-12 DIAGNOSIS — Z981 Arthrodesis status: Secondary | ICD-10-CM | POA: Diagnosis not present

## 2016-07-13 LAB — HIV-1 RNA, QUALITATIVE, TMA: HIV-1 RNA, QUAL: NOT DETECTED

## 2016-07-18 ENCOUNTER — Other Ambulatory Visit: Payer: Self-pay | Admitting: Family Medicine

## 2016-07-20 ENCOUNTER — Telehealth: Payer: Self-pay | Admitting: Cardiology

## 2016-07-20 NOTE — Telephone Encounter (Signed)
LMOVM requesting that pt send manual transmission b/c home monitor has not updated in at least 7 days.    

## 2016-07-26 ENCOUNTER — Encounter: Payer: Self-pay | Admitting: Internal Medicine

## 2016-07-26 ENCOUNTER — Ambulatory Visit (INDEPENDENT_AMBULATORY_CARE_PROVIDER_SITE_OTHER): Payer: BLUE CROSS/BLUE SHIELD | Admitting: Internal Medicine

## 2016-07-26 VITALS — BP 110/70 | HR 96 | Ht 75.25 in | Wt 302.8 lb

## 2016-07-26 DIAGNOSIS — Z86010 Personal history of colonic polyps: Secondary | ICD-10-CM

## 2016-07-26 DIAGNOSIS — K219 Gastro-esophageal reflux disease without esophagitis: Secondary | ICD-10-CM | POA: Diagnosis not present

## 2016-07-26 NOTE — Progress Notes (Signed)
HISTORY OF PRESENT ILLNESS:  Aaron Thompson is a 56 y.o. male with multiple medical problems including atrial fibrillation status post ablation, congestive heart failure with ejection fraction less than 20%, status post implantable defibrillator, chronic anticoagulation, diabetes mellitus, obesity, fatty liver, hypertension, and obstructive sleep apnea on CPAP. He is referred today by his primary care provider with chief complaint of eating surveillance colonoscopy and GERD. Patient was previously evaluated for GERD and in August 2008 underwent upper endoscopy. This was normal. He requires daily PPI for control of symptoms. If he misses 2 doses he has significant reflux symptoms. No dysphagia. No Barrett's on previous exam. Currently takes Zegerid. Patient has undergone colonoscopy previously and August 2008. In addition to diverticulosis, he was found to have 2 subcentimeter colon polyps which were serrated adenomas. Follow-up in 5 years recommended. The patient did receive recall letters but had interval health issues related to his heart. He is now stable from a cardiac standpoint and was encouraged to make this appointment to discuss surveillance colonoscopy. His GI review of systems is negative except for GERD. No interval family history of colon cancer.  REVIEW OF SYSTEMS:  All non-GI ROS negative except for sinus allergy, arthritis, hearing impairment, nose bleeds  Past Medical History:  Diagnosis Date  . Anxiety state, unspecified   . Calculus of kidney 1981   "passed it on my own"  . Chronic systolic CHF (congestive heart failure) ([place])   . Dermatophytosis of the body   . DJD (degenerative joint disease)   . Esophageal reflux   . Hx of colonic polyps   . Hypertension   . LBBB (left bundle branch block)    intermittent  . Non-ischemic cardiomyopathy ([place])    a. cath 2013: minor nonobstructive CAD.  . OSA on CPAP   . Osteoarthrosis, unspecified whether generalized or localized, hand    . Other chronic nonalcoholic liver disease    fatty liver  . PAF (paroxysmal atrial fibrillation) ([place])    a. s/p DCCV 6/11; previously on Pradaxa;  b. Event Monitor 2012->No PAF;  c. 09/2011 s/p DCCV ->Xarelto started. d. 03/2014: inappropriate ICD shocks for AF-RVR, started on Tikosyn, Xarelto restarted.  . Type II diabetes mellitus ([place])   . Unspecified hearing loss     Past Surgical History:  Procedure Laterality Date  . Abdominal US  01/2007   Fatty liver, no gallstones  . APPENDECTOMY    . BI-VENTRICULAR IMPLANTABLE CARDIOVERTER DEFIBRILLATOR [name] 02/14/2013   STJ CRTD implanted by Dr Caryl Comes  . BILATERAL KNEE ARTHROSCOPY    . CARDIOVERSION  09/29/2011   Procedure: CARDIOVERSION;  Surgeon: Thayer Headings, MD;  Location: [facility];  Service: Cardiovascular;  Laterality: [name];  . DOPPLER ECHOCARDIOGRAPHY  11/2009   Decreased EF of 35-40%  . ELECTROPHYSIOLOGIC STUDY [name] 10/14/2015   Procedure: Atrial Fibrillation Ablation;  Surgeon: [name], MD;  Location: [facility];  Service: Cardiovascular;  Laterality: [name];  . LEFT HEART CATHETERIZATION WITH CORONARY ANGIOGRAM [name] 08/18/2011   Procedure: LEFT HEART CATHETERIZATION WITH CORONARY ANGIOGRAM;  Surgeon: Peter M Martinique, MD;  [facility] C[facility] LAB;  Service: Cardiovascular;  Laterality: [name];  . NECK SURGERY     plate/fusion  . Stress Cardiolite  08/2005   Normal, EF 42%  . UPPER GASTROINTESTINAL ENDOSCOPY  08/2006   GERD    Social History Aaron Thompson  reports that he has never smoked. He has quit using smokeless tobacco. His smokeless tobacco use included Snuff. He reports that he  does not drink alcohol or use drugs.  family history includes Cirrhosis in his maternal uncle; Colon cancer in his paternal grandmother; Diabetes in his brother, brother, brother, and mother; Heart attack in his paternal grandfather; Heart failure in his father and mother; Hypertension in his father and mother; Kidney disease in his brother; Leukemia  in his sister; Multiple myeloma in his sister; Other in his sister; Prostate cancer in his brother and father; Stroke in his paternal grandmother.  No Known Allergies     PHYSICAL EXAMINATION: Vital signs: BP 110/70   Pulse 96   Ht 6' 3.25" (1.911 m)   Wt (!) 302 lb 12.8 oz (137.3 kg)   BMI 37.60 kg/m   Constitutional:Pleasant, generally well-appearing, no acute distress Psychiatric: alert and oriented x3, cooperative Eyes: extraocular movements intact, anicteric, conjunctiva pink Mouth: oral pharynx moist, no lesions Neck: supple no lymphadenopathy Cardiovascular: heart regular rate and rhythm, no murmur Lungs: clear to auscultation bilaterally Abdomen: soft, obese, nontender, nondistended, no obvious ascites, no peritoneal signs, normal bowel sounds, no organomegaly Rectal: Deferred until colonoscopy Extremities: no clubbing cyanosis or lower extremity edema bilaterally Skin: no lesions on visible extremities Neuro: Impaired hearing. Otherwise, No focal deficits. No asterixis.    ASSESSMENT:  #1. History of sessile serrated polyps. Overdue for follow-up #2. GERD without esophagitis requiring PPI for control #3. Multiple medical problems as outlined including congestive heart failure, diabetes mellitus and the need for chronic anticoagulation with Xarelto   PLAN:  #1. Reflux precautions #2. Continue PPI for control of GERD symptoms #3. Schedule surveillance colonoscopy. The patient is HIGH RISK given his comorbidities including significant reduced cardiac ejection fraction and the need to address his diabetic medications and anticoagulation. The examination will need to be scheduled at the hospital with MAC/propofol.The nature of the procedure, as well as the risks, benefits, and alternatives were carefully and thoroughly reviewed with the patient. Ample time for discussion and questions allowed. The patient understood, was satisfied, and agreed to proceed. #4. Hold diabetic  medications the day of the procedure to avoid and wanted hypoglycemia #5. Hold Xarelto 2 days prior to the procedure. We will check with Dr. [name] to make sure this is acceptable  A copy of this consultation note has been sent to Dr. Glori Bickers and Dr. [name]

## 2016-07-26 NOTE — Patient Instructions (Signed)
I will contact you as soon as the new hospital schedule comes out to schedule your colonoscopy

## 2016-07-27 ENCOUNTER — Other Ambulatory Visit: Payer: Self-pay

## 2016-07-27 DIAGNOSIS — R441 Visual hallucinations: Secondary | ICD-10-CM

## 2016-08-04 ENCOUNTER — Ambulatory Visit: Payer: BLUE CROSS/BLUE SHIELD | Admitting: Family Medicine

## 2016-08-12 ENCOUNTER — Other Ambulatory Visit: Payer: Self-pay | Admitting: Physician Assistant

## 2016-08-12 NOTE — Telephone Encounter (Signed)
Potassium Chloride ER 20 MEQ TBCR  Medication  Date: 03/19/2016 Department: [facility] Office Ordering/Authorizing: [name], MD  Order Providers   Prescribing Provider Encounter Provider  [name], MD [name], MD  Medication Detail    Disp Refills Start End   Potassium Chloride ER 20 MEQ TBCR 225 tablet 3 03/19/2016    Sig: Take 2 tablets by mouth in the AM & 1/2 tablet by mouth in the PM   Sent to pharmacy as: Potassium Chloride ER 20 MEQ Tab CR   E-Prescribing Status: Receipt confirmed by pharmacy (03/19/2016 8:21 AM EST)   Pharmacy   [id number] - [place], [address] AT [place] already on file for a year with Pharmacy. Not appropriate at this time to refill

## 2016-08-23 ENCOUNTER — Telehealth: Payer: Self-pay

## 2016-08-23 NOTE — Telephone Encounter (Signed)
Lm on cell phone that colonoscopy was scheduled at [facility] for 10/26/2016 at 10:30am and that patient needed to call back so we could schedule the previsit.

## 2016-08-23 NOTE — Telephone Encounter (Signed)
  08/23/2016   RE: Aaron Thompson DOB: 22-Oct-1960 MRN: [id number]   Dear Dr. [name],    We have scheduled the above patient for an endoscopic procedure. Our records show that he is on anticoagulation therapy.   Please advise as to how long the patient may come off his therapy of Xarelto prior to the procedure, which is scheduled for 10/26/2016.  Please fax back/ or route the completed form to [place] at [phone].   Sincerely,    Phillis Haggis

## 2016-08-25 NOTE — Telephone Encounter (Signed)
Patient with diagnosis of AF on Xarelto for anticoagulation.    Procedure: endoscopic procedure Date of procedure: 10/26/16  CHADS2 score of 3(CHF, HTN, AGE);  CrCl WNL  Per office protocol, patient can hold Xarelto for 2 days prior to procedure.   Patient will not need bridging with Lovenox (enoxaparin) around procedure.  If not bridging, patient should restart Xarelto on the evening of procedure or day after, at discretion of procedure MD

## 2016-08-26 NOTE — Telephone Encounter (Signed)
Called patient to schedule previsit but he had already called and scheduled one.  I told him that per Dr. [name] he could hold his Xarelto for 2 days prior to his procedure.  Patient agreed.

## 2016-08-27 DIAGNOSIS — Z86010 Personal history of colonic polyps: Secondary | ICD-10-CM

## 2016-09-07 ENCOUNTER — Encounter: Payer: Self-pay | Admitting: Family Medicine

## 2016-09-07 ENCOUNTER — Ambulatory Visit (INDEPENDENT_AMBULATORY_CARE_PROVIDER_SITE_OTHER): Payer: BLUE CROSS/BLUE SHIELD | Admitting: Family Medicine

## 2016-09-07 VITALS — BP 124/70 | HR 79 | Temp 98.1°F | Wt 300.8 lb

## 2016-09-07 DIAGNOSIS — M549 Dorsalgia, unspecified: Secondary | ICD-10-CM | POA: Insufficient documentation

## 2016-09-07 DIAGNOSIS — M545 Low back pain, unspecified: Secondary | ICD-10-CM

## 2016-09-07 MED ORDER — METHOCARBAMOL 500 MG PO TABS: 500.0000 mg | ORAL_TABLET | Freq: Three times a day (TID) | ORAL | 0 refills | Status: DC | PRN

## 2016-09-07 NOTE — Patient Instructions (Signed)
Ice or heating pad to the back (whichever soothes better).  Continue tylenol and tramadol. May add on a muscle relaxant robaxin as needed.  Gentle stretching exercised provided today.  Let us know if not improving with treatment.

## 2016-09-07 NOTE — Progress Notes (Signed)
BP 124/70   Pulse 79   Temp 98.1 F (36.7 C) (Oral)   Wt (!) 300 lb 12 oz (136.4 kg)   SpO2 97%   BMI 37.34 kg/m    CC: lower back pain Subjective:    Patient ID: Aaron Thompson, male    DOB: Aug 14, 1960, 56 y.o.   MRN: [id number]  HPI: Aaron Thompson is a 56 y.o. male presenting on 09/07/2016 for Back Pain (denies urinary issues. possible injury playing golf)   2-3 wks ago while playing golf, felt he pulled something L lower back. Over time pain has radiated to left groin. Yesterday while mowing lawn, hit bump and felt significant pain across entire lower back bilaterally. Last night only able to sleep on his side with pillow between the legs. Today pain much better. Extra tramadol have been helping. He has also been using tylenol. Denies other inciting trauma/injury. No shooting pain down legs, numbness/weakness of legs, bowel/bladder incontinence, fevers.   No pain when sitting or bending forward. Pain only comes on when standing.   Denies inciting trauma/injury.  Avoids NSAIDs due to xarelto [facility].  Known diabetic, known chronic sCHF with Bi-vent pacer/defibrillator.   Relevant past medical, surgical, family and social history reviewed and updated as indicated. Interim medical history since our last visit reviewed. Allergies and medications reviewed and updated. Outpatient Medications Prior to Visit  Medication Sig Dispense Refill  . carvedilol (COREG) 25 MG tablet Take 2 tablets (50 mg total) by mouth 2 (two) times daily with a meal. 360 tablet 3  . cetirizine (ZYRTEC) 10 MG tablet Take 10 mg by mouth daily as needed for allergies.    [name] dofetilide (TIKOSYN) 500 MCG capsule TAKE 1 CAPSULE(500 MCG) BY MOUTH TWICE DAILY 180 capsule 3  . glipiZIDE (GLUCOTROL XL) 5 MG 24 hr tablet TAKE 1 TABLET(5 MG) BY MOUTH DAILY WITH BREAKFAST 30 tablet 11  . glucose blood (ONE TOUCH ULTRA TEST) test strip CHECK GLUCOSE TWICE DAILY AND AS NEEDED, DX. E11.65 (UNCONTROLLED DM) 100 each 5  . losartan  (COZAAR) 50 MG tablet TAKE 1 TABLET(50 MG) BY MOUTH DAILY 30 tablet 9  . metFORMIN (GLUCOPHAGE XR) 500 MG 24 hr tablet Take 3 tablets (1,500 mg total) by mouth daily with breakfast. 90 tablet 11  . Multiple Vitamin (MULTIVITAMIN) tablet Take 1 tablet by mouth daily.      [name] Bicarbonate (ZEGERID OTC) 20-1100 MG CAPS Take 1 capsule by mouth 2 (two) times daily.     . Potassium Chloride ER 20 MEQ TBCR Take 2 tablets by mouth in the AM & 1/2 tablet by mouth in the PM 225 tablet 3  . spironolactone (ALDACTONE) 25 MG tablet TAKE 1 TABLET(25 MG) BY MOUTH DAILY 30 tablet 5  . traMADol (ULTRAM) 50 MG tablet TAKE 1 TABLET BY MOUTH TWICE DAILY AS NEEDED FOR MODERATE PAIN 60 tablet 0  . XARELTO 20 MG TABS tablet TAKE 1 TABLET(20 MG) BY MOUTH DAILY WITH SUPPER 30 tablet 5   No facility-administered medications prior to visit.      Per HPI unless specifically indicated in ROS section below Review of Systems     Objective:    BP 124/70   Pulse 79   Temp 98.1 F (36.7 C) (Oral)   Wt (!) 300 lb 12 oz (136.4 kg)   SpO2 97%   BMI 37.34 kg/m   Wt Readings from Last 3 Encounters:  09/07/16 (!) 300 lb 12 oz (136.4 kg)  07/26/16 [name])  302 lb 12.8 oz (137.3 kg)  07/07/16 (!) 304 lb 8 oz (138.1 kg)    Physical Exam  Constitutional: He is oriented to person, place, and time. He appears well-developed and well-nourished. No distress.  Musculoskeletal: He exhibits no edema.  Mild discomfort and bulge lower lumbar midline spine ++ L mid lumbar paraspinous mm tenderness Neg SLR bilaterally. No pain with int/ext rotation at hip. No pain at SIJ, GTB or sciatic notch bilaterally.   Neurological: He is alert and oriented to person, place, and time.  Skin: Skin is warm and dry. No rash noted.  Psychiatric: He has a normal mood and affect.  Nursing note and vitals reviewed.     Assessment & Plan:   Problem List Items Addressed This Visit    Lower back pain - Primary    Acute left sided  lower back pain that started after golf several weeks ago, exacerbated after mowing lawn yesterday with jarring movement. Anticipate left lumbar strain. Supportive care reviewed - continue tylenol/tramadol, add robaxin muscle relaxant, continue ice/heating pad, update if not improving with treatment. Provided with exercises from [facility] pt advisor on lower back pain      Relevant Medications   methocarbamol (ROBAXIN) 500 MG tablet       Follow up plan: Return if symptoms worsen or fail to improve.  Ria Bush, MD

## 2016-09-07 NOTE — Assessment & Plan Note (Signed)
Acute left sided lower back pain that started after golf several weeks ago, exacerbated after mowing lawn yesterday with jarring movement. Anticipate left lumbar strain. Supportive care reviewed - continue tylenol/tramadol, add robaxin muscle relaxant, continue ice/heating pad, update if not improving with treatment. Provided with exercises from [facility] pt advisor on lower back pain

## 2016-09-13 ENCOUNTER — Encounter: Payer: BLUE CROSS/BLUE SHIELD | Admitting: *Deleted

## 2016-09-16 ENCOUNTER — Encounter: Payer: Self-pay | Admitting: Cardiology

## 2016-09-20 ENCOUNTER — Telehealth: Payer: Self-pay | Admitting: *Deleted

## 2016-09-20 ENCOUNTER — Ambulatory Visit (INDEPENDENT_AMBULATORY_CARE_PROVIDER_SITE_OTHER): Payer: BLUE CROSS/BLUE SHIELD | Admitting: *Deleted

## 2016-09-20 DIAGNOSIS — I428 Other cardiomyopathies: Secondary | ICD-10-CM

## 2016-09-20 NOTE — Telephone Encounter (Signed)
Patient's wife left a voicemail stating that patient needs a letter from Dr. Glori Bickers. Patient has been trying to get disability and is finally going before a Neurosurgeon. Mrs. Scully stated that patient only has about 15% heart function and has been told by Dr. Glori Bickers that he should only walk once a day. Patient's wife stated that this letter will help him when he goes for his disability.

## 2016-09-20 NOTE — Telephone Encounter (Signed)
He was doing better the last time I saw him (out of a fib) , and I reviewed his may note from cardiology - it is unclear from that what limitations he has -so I would recommend they ask cardiology for a note

## 2016-09-20 NOTE — Progress Notes (Signed)
Remote ICD transmission.   

## 2016-09-21 NOTE — Telephone Encounter (Signed)
Left voicemail requesting pt's wife to call the office back

## 2016-09-21 NOTE — Telephone Encounter (Signed)
Since that was a while ago I need to see him back to discuss how symptoms are/how he has progressed since then before I can make a statement   Please make an appointment when able

## 2016-09-21 NOTE — Telephone Encounter (Signed)
Wife and pt notified of Dr. Marliss Coots comments. They said that they have already requested all of the cardiology notes but they just wanted Dr. Glori Bickers to write the letter. He said thelast time he was here he told Dr. Glori Bickers he was helping his brother move and she told him that he shouldn't work or do any heavy lifting and the most he should do is walk once daily.

## 2016-09-22 NOTE — Telephone Encounter (Signed)
Left voicemail requesting pt to call the office back 

## 2016-09-23 NOTE — Telephone Encounter (Signed)
Pt called back and Melissa scheduled him a 13min OV to discuss letter

## 2016-09-29 ENCOUNTER — Ambulatory Visit: Payer: BLUE CROSS/BLUE SHIELD | Admitting: Family Medicine

## 2016-10-01 ENCOUNTER — Encounter: Payer: Self-pay | Admitting: Cardiology

## 2016-10-04 ENCOUNTER — Telehealth: Payer: Self-pay | Admitting: Cardiovascular Disease

## 2016-10-04 NOTE — Telephone Encounter (Signed)
Patient called about his A. Fib. Patient stated at the time he went into A. Fib he was SOB, his chest hurt, and  feels like he was going to pass out. Patient stated he is fine now. Patient wanted to know if anything showed up on his device. Patient also wanted Dr. Rayann Heman and Dr. [name] to know of this episode. Will forward message to device, Dr. Rayann Heman and Dr. [name].

## 2016-10-04 NOTE — Telephone Encounter (Signed)
Thanks for letting me know - will see if he re schedules

## 2016-10-04 NOTE — Telephone Encounter (Signed)
New message      Calling to let the nurse know that his heart went out of rhythm last night about 10pm.  It then went back in rhythm.  Please call.

## 2016-10-04 NOTE — Telephone Encounter (Signed)
Aaron Thompson recounting the events of last night. He was on the way to the hospital when the AF stopped- he sent transmission. He has been compliant with avoiding alcohol and taking his coreg/tikosyn as prescribed. He will call back if it reoccurs. He is appreciative of call.

## 2016-10-04 NOTE — Telephone Encounter (Signed)
LMOM to return call to [facility] regarding episode of AF last night.   AF RVR (rates ~180-200bpm) 9pm-9:30pm. Reviewed with Dr. Rayann Heman- he wants to ensure that the patient is being compliant with 50mg  Coreg BID, tikosyn 500mg  BID and avoiding alcohol.

## 2016-10-04 NOTE — Telephone Encounter (Signed)
Patient's wife Freda Munro (on [place]) called stating that her husband cancelled his appointment because he was so upset about paperwork that was filled out by Dr. Glori Bickers in August for [employer] stating that patient was working. 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 Hetty Ely that her husband should have call the office back and let Dr. Glori Bickers know this. Mrs. Celani stated that because of the paperwork filled out in August patient lost over $500 extra income for each month. Freda Munro stated that her husband got upset about this and that is why he cancelled the appointment for the 30 minute appointment. Patient's wife stated that he was having heart problems during the night and is dealing with cardiology today. Freda Munro stated that she is going to talk with her husband and try to get him to reschedule the appointment with Dr. Glori Bickers and may call back to do that. Freda Munro stated that she just wanted to let Dr. Glori Bickers know what is going on at this point.

## 2016-10-05 ENCOUNTER — Ambulatory Visit (INDEPENDENT_AMBULATORY_CARE_PROVIDER_SITE_OTHER): Payer: BLUE CROSS/BLUE SHIELD | Admitting: Family Medicine

## 2016-10-05 ENCOUNTER — Encounter: Payer: Self-pay | Admitting: Family Medicine

## 2016-10-05 VITALS — BP 106/64 | HR 82 | Temp 98.6°F | Ht 75.25 in | Wt 304.8 lb

## 2016-10-05 DIAGNOSIS — I10 Essential (primary) hypertension: Secondary | ICD-10-CM

## 2016-10-05 DIAGNOSIS — Z23 Encounter for immunization: Secondary | ICD-10-CM | POA: Diagnosis not present

## 2016-10-05 DIAGNOSIS — I5022 Chronic systolic (congestive) heart failure: Secondary | ICD-10-CM | POA: Diagnosis not present

## 2016-10-05 DIAGNOSIS — I480 Paroxysmal atrial fibrillation: Secondary | ICD-10-CM | POA: Diagnosis not present

## 2016-10-05 DIAGNOSIS — IMO0001 Reserved for inherently not codable concepts without codable children: Secondary | ICD-10-CM

## 2016-10-05 DIAGNOSIS — I428 Other cardiomyopathies: Secondary | ICD-10-CM | POA: Diagnosis not present

## 2016-10-05 DIAGNOSIS — Z6837 Body mass index (BMI) 37.0-37.9, adult: Secondary | ICD-10-CM

## 2016-10-05 DIAGNOSIS — E1165 Type 2 diabetes mellitus with hyperglycemia: Secondary | ICD-10-CM | POA: Diagnosis not present

## 2016-10-05 NOTE — Patient Instructions (Addendum)
Any work restrictions will need to come from cardiology   Think about discussing cardio-pulmonary rehab (? Are you a candidate) with your cardiology   Call your insurance and find out EXACTLY what band of meter/strips/ and lancets you need px for you   Don't forget to schedule your annual eye exam   Labs today  Flu shot

## 2016-10-05 NOTE — Assessment & Plan Note (Signed)
Due for A1C Wt is stable  Unable to exercise much due to CHF  Diet is much improved  Continue metrofmin and glipizide Wt loss enc  He will make his own opthy appt

## 2016-10-05 NOTE — Assessment & Plan Note (Signed)
Causing CHF (in addition to intermittent a fib) with ICD  Pt is unable to work a physical job  His limitations were discussed in detail today  He will discuss poss of cardio-pulm rehab with his cardiologist He plans to apply for disability

## 2016-10-05 NOTE — Progress Notes (Signed)
Subjective:    Patient ID: Aaron Thompson, male    DOB: 03-Dec-1960, 56 y.o.   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  [place] working in 3/16    Wt Readings from Last 3 Encounters:  10/05/16 (!) 304 lb 12 oz (138.2 kg)  09/07/16 (!) 300 lb 12 oz (136.4 kg)  07/26/16 (!) 302 lb 12.8 oz (137.3 kg)   BP Readings from Last 3 Encounters:  10/05/16 106/64  09/07/16 124/70  07/26/16 110/70   Pulse Readings from Last 3 Encounters:  10/05/16 82  09/07/16 79  07/26/16 96   Hx of nonischemic cardiomyopathy   Also a fib s/p ablation  He had an episode of afib with rate of 150 on Sunday unfortunately  One short episode in feb as well It went back on its own Still on [place]  Cardiology is obs closely   Currently 15-20% EF   Sees Dr [name] for CHF (team including Dr Rayann Heman also)  Dr Caryl Comes for rhythm  Neither have suggested cardiac rehab   Also has ICD  It has gone off 5 times since he got it (knocks him unconscious)  Is restricted from ladders because the ICD will knock him off  He still drives    Last seen in may - 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  He had to stop due to fatigue and sob (noticed he was struggling)   Now endurance is much worse since June 1 Cannot keep up walking with his wife (for instance in [place]) Can walk 150 feet before he is short of breath  2/10 of a mile before legs get too weak to carry him  No chest pain  A fib can happen w/o endurance  Can ride a mower-cannot push mow at all   Cannot lift more than 25 lb  Gets sob and fatigue immediately and he gets chest pressure   Does not get leg swelling with CHF Weight fluctuates Goal is wt of 260 -- watching diet/no fried foods/ no bacon sausage Eats salads and produce  Avoids sodium   Has to  call cardiology if wt goes up more than 4 lb  On spironolactone  Not on lasix   He cannot do a sit down /desk job due to risk of ICD firing (per pt)  Also he does not feel like he has the education to do a desk job     Diabetes - needs a new meter  Lab Results  Component Value Date   HGBA1C 7.8 (H) 06/16/2016   Due for a re check  Diet is much better  Exercise is not great  Wt is up a bit (some fluid)  No sweetened drinks  Taking metformin xr 1500  Needs eye exam  Has a cataract also (that is not operable)   Patient Active Problem List   Diagnosis Date Noted  . Lower back pain 09/07/2016  . Family history of prostate cancer 06/23/2016  . Frequent urination 06/23/2016  . Encounter for hepatitis C screening test for low risk patient 06/23/2016  . Encounter for screening for HIV 06/23/2016  . Colon polyps 06/23/2016  . Laboratory examination ordered as part of a routine general medical examination 03/08/2016  . Routine general medical examination at a  health care facility 03/08/2016  . Prostate cancer screening 03/08/2016  . A-fib ([place]) 10/14/2015  . Hypokalemia 08/12/2015  . Inappropriate shocks from ICD (implantable cardioverter-defibrillator) 03/17/2015  . Hearing loss of right ear due to cerumen impaction 03/13/2015  . Obesity 12/05/2014  . Osteoarthritis of both hands 07/31/2014  . Low serum potassium level 04/21/2014  . ICD (implantable cardioverter-defibrillator) discharge 04/19/2014  . Chronic systolic heart failure ([place]) 02/14/2013  . Hyperlipidemia 02/05/2013  . Diabetes type 2, uncontrolled ([place]) 09/30/2011  . Nonischemic cardiomyopathy ([place]) 08/18/2011  . Chest pain on exertion 08/17/2011  . Type II or unspecified type diabetes mellitus without mention of complication, not stated as uncontrolled 10/06/2010  . Palpitations 05/12/2010  . CARDIOMYOPATHY OTHER DISEASES CLASSIFIED ELSW 07/16/2009  . OSTEOARTHROSIS UNSPEC WHETHER GEN/LOCALIZED HAND 08/16/2008  .  Essential hypertension 05/11/2007  . History of renal calculi 05/11/2007  . Fatty liver 03/20/2007  . ANXIETY 10/04/2006  . HEARING IMPAIRMENT 10/04/2006  . FOLLICULITIS 50/35/4656  . GERD 09/20/2006   Past Medical History:  Diagnosis Date  . Anxiety state, unspecified   . Calculus of kidney 1981   "passed it on my own"  . Chronic systolic CHF (congestive heart failure) ([place])   . Dermatophytosis of the body   . DJD (degenerative joint disease)   . Esophageal reflux   . Hx of colonic polyps   . Hypertension   . LBBB (left bundle branch block)    intermittent  . Non-ischemic cardiomyopathy ([place])    a. cath 2013: minor nonobstructive CAD.  . OSA on CPAP   . Osteoarthrosis, unspecified whether generalized or localized, hand   . Other chronic nonalcoholic liver disease    fatty liver  . PAF (paroxysmal atrial fibrillation) ([place])    a. s/p DCCV 6/11; previously on Pradaxa;  b. Event Monitor 2012->No PAF;  c. 09/2011 s/p DCCV ->Xarelto started. d. 03/2014: inappropriate ICD shocks for AF-RVR, started on Tikosyn, Xarelto restarted.  . Type II diabetes mellitus ([place])   . Unspecified hearing loss    Past Surgical History:  Procedure Laterality Date  . Abdominal US  01/2007   Fatty liver, no gallstones  . APPENDECTOMY    . BI-VENTRICULAR IMPLANTABLE CARDIOVERTER DEFIBRILLATOR [name] 02/14/2013   STJ CRTD implanted by Dr Caryl Comes  . BILATERAL KNEE ARTHROSCOPY    . CARDIOVERSION  09/29/2011   Procedure: CARDIOVERSION;  Surgeon: Thayer Headings, MD;  Location: [facility];  Service: Cardiovascular;  Laterality: [name];  . DOPPLER ECHOCARDIOGRAPHY  11/2009   Decreased EF of 35-40%  . ELECTROPHYSIOLOGIC STUDY [name] 10/14/2015   Procedure: Atrial Fibrillation Ablation;  Surgeon: [name], MD;  Location: [facility] CV LAB;  Service: Cardiovascular;  Laterality: [name];  . LEFT HEART CATHETERIZATION WITH CORONARY ANGIOGRAM [name] 08/18/2011   Procedure: LEFT HEART CATHETERIZATION WITH CORONARY ANGIOGRAM;   Surgeon: Peter M Martinique, MD;  [facility] C[facility] LAB;  Service: Cardiovascular;  Laterality: [name];  . NECK SURGERY     plate/fusion  . Stress Cardiolite  08/2005   Normal, EF 42%  . UPPER GASTROINTESTINAL ENDOSCOPY  08/2006   GERD   Social History  Substance Use Topics  . Smoking status: Never Smoker  . Smokeless tobacco: Former Systems developer    Types: Snuff     Comment: 02/14/2013 "quit snuff in 2006"  . Alcohol use No   Family History  Problem Relation Age of Onset  . Hypertension Mother   . Diabetes Mother   . Heart failure Mother  Died @ 89  . Hypertension Father   . Heart failure Father        Died @ 2  . Prostate cancer Father   . Diabetes Brother   . Kidney disease Brother   . Diabetes Brother   . Prostate cancer Brother   . Diabetes Brother   . Other Sister   . Heart attack Paternal Grandfather   . Stroke Paternal Grandmother   . Colon cancer Paternal Grandmother   . Multiple myeloma Sister   . Leukemia Sister   . Cirrhosis Maternal Uncle        alcohol related  . Esophageal cancer Neg Hx   . Stomach cancer Neg Hx   . Pancreatic cancer Neg Hx    No Known Allergies Current Outpatient Prescriptions on File Prior to Visit  Medication Sig Dispense Refill  . carvedilol (COREG) 25 MG tablet Take 2 tablets (50 mg total) by mouth 2 (two) times daily with a meal. 360 tablet 3  . cetirizine (ZYRTEC) 10 MG tablet Take 10 mg by mouth daily as needed for allergies.    [name] dofetilide (TIKOSYN) 500 MCG capsule TAKE 1 CAPSULE(500 MCG) BY MOUTH TWICE DAILY 180 capsule 3  . glipiZIDE (GLUCOTROL XL) 5 MG 24 hr tablet TAKE 1 TABLET(5 MG) BY MOUTH DAILY WITH BREAKFAST 30 tablet 11  . glucose blood (ONE TOUCH ULTRA TEST) test strip CHECK GLUCOSE TWICE DAILY AND AS NEEDED, DX. E11.65 (UNCONTROLLED DM) 100 each 5  . losartan (COZAAR) 50 MG tablet TAKE 1 TABLET(50 MG) BY MOUTH DAILY 30 tablet 9  . metFORMIN (GLUCOPHAGE XR) 500 MG 24 hr tablet Take 3 tablets (1,500 mg total) by mouth daily  with breakfast. 90 tablet 11  . methocarbamol (ROBAXIN) 500 MG tablet Take 1 tablet (500 mg total) by mouth 3 (three) times daily as needed for muscle spasms. 40 tablet 0  . Multiple Vitamin (MULTIVITAMIN) tablet Take 1 tablet by mouth daily.      [name] Bicarbonate (ZEGERID OTC) 20-1100 MG CAPS Take 1 capsule by mouth 2 (two) times daily.     . Potassium Chloride ER 20 MEQ TBCR Take 2 tablets by mouth in the AM & 1/2 tablet by mouth in the PM 225 tablet 3  . spironolactone (ALDACTONE) 25 MG tablet TAKE 1 TABLET(25 MG) BY MOUTH DAILY 30 tablet 5  . XARELTO 20 MG TABS tablet TAKE 1 TABLET(20 MG) BY MOUTH DAILY WITH SUPPER 30 tablet 5   No current facility-administered medications on file prior to visit.     Review of Systems  Constitutional: Positive for fatigue. Negative for activity change, appetite change, fever and unexpected weight change.  HENT: Negative for congestion, rhinorrhea, sore throat and trouble swallowing.   Eyes: Negative for pain, redness, itching and visual disturbance.  Respiratory: Positive for chest tightness and shortness of breath. Negative for apnea, cough and wheezing.   Cardiovascular: Negative for chest pain and palpitations.  Gastrointestinal: Negative for abdominal pain, blood in stool, constipation, diarrhea and nausea.  Endocrine: Negative for cold intolerance, heat intolerance, polydipsia and polyuria.  Genitourinary: Negative for difficulty urinating, dysuria, frequency and urgency.  Musculoskeletal: Negative for arthralgias, joint swelling and myalgias.  Skin: Negative for pallor and rash.  Neurological: Negative for dizziness, tremors, weakness, numbness and headaches.       Pos for neuropathy symptoms in feet  Hematological: Negative for adenopathy. Does not bruise/bleed easily.  Psychiatric/Behavioral: Negative for decreased concentration and dysphoric mood. The patient is not nervous/anxious.  Objective:   Physical Exam    Constitutional: He appears well-developed and well-nourished. No distress.  obese and well appearing   HENT:  Head: Normocephalic and atraumatic.  Mouth/Throat: Oropharynx is clear and moist.  Eyes: Pupils are equal, round, and reactive to light. Conjunctivae and EOM are normal.  Neck: Normal range of motion. Neck supple. No JVD present. Carotid bruit is not present. No thyromegaly present.  Cardiovascular: Normal rate, regular rhythm, normal heart sounds and intact distal pulses.  Exam reveals no gallop.   RRR today  Pulmonary/Chest: Effort normal and breath sounds normal. No respiratory distress. He has no wheezes. He has no rales.  No crackles  Abdominal: Soft. Bowel sounds are normal. He exhibits no distension, no abdominal bruit and no mass. There is no tenderness.  Musculoskeletal: He exhibits no edema or tenderness.  No pedal edema   Lymphadenopathy:    He has no cervical adenopathy.  Neurological: He is alert. He has normal reflexes. No cranial nerve deficit. He exhibits normal muscle tone. Coordination normal.  Skin: Skin is warm and dry. No rash noted. No pallor.  Psychiatric: He has a normal mood and affect.          Assessment & Plan:   Problem List Items Addressed This Visit      Cardiovascular and Mediastinum   A-fib ([place])    Pt still has occasional exacerbations with RVR despite past ablation  Continues Tikosyn and f/u with Dr Caryl Comes       Chronic systolic heart failure ([place])    Causing CHF (in addition to intermittent a fib) with ICD  Pt is unable to work a physical job  His limitations were discussed in detail today  He will discuss poss of cardio-pulm rehab with his cardiologist He plans to apply for disability       Essential hypertension    bp in fair control at this time  BP Readings from Last 1 Encounters:  10/05/16 106/64   No changes needed Disc lifstyle change with low sodium diet and exercise        Relevant Orders   Basic metabolic  panel   Nonischemic cardiomyopathy ([place]) - Primary    Causing CHF (in addition to intermittent a fib) with ICD  Pt is unable to work a physical job  His limitations were discussed in detail today  He will discuss poss of cardio-pulm rehab with his cardiologist He plans to apply for disability          Endocrine   Diabetes type 2, uncontrolled ([place])    Due for A1C Wt is stable  Unable to exercise much due to CHF  Diet is much improved  Continue metrofmin and glipizide Wt loss enc  He will make his own opthy appt       Relevant Orders   Hemoglobin V8L   Basic metabolic panel     Other   Obesity    Disc goals for wt loss Continues to cut carbs and portions        Other Visit Diagnoses    Need for influenza vaccination       Relevant Orders   Flu Vaccine QUAD 6+ mos PF IM (Fluarix Quad PF) (Completed)

## 2016-10-05 NOTE — Assessment & Plan Note (Signed)
bp in fair control at this time  BP Readings from Last 1 Encounters:  10/05/16 106/64   No changes needed Disc lifstyle change with low sodium diet and exercise

## 2016-10-05 NOTE — Assessment & Plan Note (Signed)
Pt still has occasional exacerbations with RVR despite past ablation  Continues Tikosyn and f/u with Dr Caryl Comes

## 2016-10-05 NOTE — Assessment & Plan Note (Signed)
Disc goals for wt loss Continues to cut carbs and portions

## 2016-10-06 LAB — BASIC METABOLIC PANEL
BUN: 26 mg/dL — AB (ref 6–23)
CHLORIDE: 100 meq/L (ref 96–112)
CO2: 28 mEq/L (ref 19–32)
CREATININE: 0.87 mg/dL (ref 0.40–1.50)
Calcium: 9.3 mg/dL (ref 8.4–10.5)
GFR: 96.84 mL/min (ref >60.00)
GLUCOSE: 123 mg/dL — AB (ref 70–99)
Potassium: 4.1 mEq/L (ref 3.5–5.1)
Sodium: 138 mEq/L (ref 135–145)

## 2016-10-06 LAB — HEMOGLOBIN A1C: Hgb A1c MFr Bld: 7.8 % — ABNORMAL HIGH (ref 4.6–6.5)

## 2016-10-08 LAB — CUP PACEART REMOTE DEVICE CHECK
Date Time Interrogation Session: 20180914083357
Implantable Lead Implant Date: 20150121
Implantable Lead Implant Date: 20150121
Implantable Lead Location: 753860
Implantable Pulse Generator Implant Date: 20150121
MDC IDC LEAD IMPLANT DT: 20150121
MDC IDC LEAD LOCATION: 753858
MDC IDC LEAD LOCATION: 753859
Pulse Gen Serial Number: 7133975

## 2016-10-12 ENCOUNTER — Ambulatory Visit (AMBULATORY_SURGERY_CENTER): Payer: Self-pay

## 2016-10-12 VITALS — Ht 76.0 in | Wt 305.2 lb

## 2016-10-12 DIAGNOSIS — Z800 Family history of malignant neoplasm of digestive organs: Secondary | ICD-10-CM

## 2016-10-12 DIAGNOSIS — Z8371 Family history of colonic polyps: Secondary | ICD-10-CM

## 2016-10-12 DIAGNOSIS — Z86010 Personal history of colonic polyps: Secondary | ICD-10-CM

## 2016-10-12 MED ORDER — NA SULFATE-K SULFATE-MG SULF 17.5-3.13-1.6 GM/177ML PO SOLN: ORAL | 0 refills | Status: DC

## 2016-10-12 NOTE — Progress Notes (Signed)
Per pt, no allergies to soy or egg products.Pt not taking any weight loss meds or using  O2 at home.   Pt refused Emmi video. 

## 2016-10-13 ENCOUNTER — Other Ambulatory Visit: Payer: Self-pay | Admitting: Cardiovascular Disease

## 2016-10-21 ENCOUNTER — Encounter (HOSPITAL_COMMUNITY): Payer: Self-pay | Admitting: *Deleted

## 2016-10-22 ENCOUNTER — Telehealth: Payer: Self-pay | Admitting: Internal Medicine

## 2016-10-25 NOTE — Telephone Encounter (Signed)
Spoke with patient and told him I would leave a Suprep sample up front to be picked up.  Patient agreed.  

## 2016-10-25 NOTE — Telephone Encounter (Signed)
Checking back on help with prep for upcoming procedure.

## 2016-10-26 ENCOUNTER — Ambulatory Visit (HOSPITAL_COMMUNITY)
Admission: RE | Admit: 2016-10-26 | Discharge: 2016-10-26 | Disposition: A | Discharge status: Home / Self Care | Payer: BLUE CROSS/BLUE SHIELD | Source: Ambulatory Visit | Attending: Internal Medicine | Admitting: Internal Medicine

## 2016-10-26 ENCOUNTER — Encounter (HOSPITAL_COMMUNITY): Payer: Self-pay

## 2016-10-26 ENCOUNTER — Ambulatory Visit (HOSPITAL_COMMUNITY): Payer: BLUE CROSS/BLUE SHIELD | Admitting: Certified Registered Nurse Anesthetist

## 2016-10-26 ENCOUNTER — Encounter (HOSPITAL_COMMUNITY)
Admission: RE | Disposition: A | Discharge status: Home / Self Care | Payer: Self-pay | Source: Ambulatory Visit | Attending: Internal Medicine

## 2016-10-26 DIAGNOSIS — I5022 Chronic systolic (congestive) heart failure: Secondary | ICD-10-CM | POA: Insufficient documentation

## 2016-10-26 DIAGNOSIS — G4733 Obstructive sleep apnea (adult) (pediatric): Secondary | ICD-10-CM | POA: Insufficient documentation

## 2016-10-26 DIAGNOSIS — K219 Gastro-esophageal reflux disease without esophagitis: Secondary | ICD-10-CM | POA: Diagnosis not present

## 2016-10-26 DIAGNOSIS — Z87442 Personal history of urinary calculi: Secondary | ICD-10-CM | POA: Insufficient documentation

## 2016-10-26 DIAGNOSIS — E669 Obesity, unspecified: Secondary | ICD-10-CM | POA: Diagnosis not present

## 2016-10-26 DIAGNOSIS — Z7901 Long term (current) use of anticoagulants: Secondary | ICD-10-CM | POA: Diagnosis not present

## 2016-10-26 DIAGNOSIS — Z860100 Personal history of colon polyps, unspecified: Secondary | ICD-10-CM

## 2016-10-26 DIAGNOSIS — Z87891 Personal history of nicotine dependence: Secondary | ICD-10-CM | POA: Insufficient documentation

## 2016-10-26 DIAGNOSIS — H9190 Unspecified hearing loss, unspecified ear: Secondary | ICD-10-CM | POA: Insufficient documentation

## 2016-10-26 DIAGNOSIS — K5730 Diverticulosis of large intestine without perforation or abscess without bleeding: Secondary | ICD-10-CM | POA: Diagnosis not present

## 2016-10-26 DIAGNOSIS — Z1211 Encounter for screening for malignant neoplasm of colon: Secondary | ICD-10-CM | POA: Insufficient documentation

## 2016-10-26 DIAGNOSIS — K648 Other hemorrhoids: Secondary | ICD-10-CM | POA: Diagnosis not present

## 2016-10-26 DIAGNOSIS — E119 Type 2 diabetes mellitus without complications: Secondary | ICD-10-CM | POA: Insufficient documentation

## 2016-10-26 DIAGNOSIS — I110 Hypertensive heart disease with heart failure: Secondary | ICD-10-CM | POA: Insufficient documentation

## 2016-10-26 DIAGNOSIS — I480 Paroxysmal atrial fibrillation: Secondary | ICD-10-CM | POA: Insufficient documentation

## 2016-10-26 DIAGNOSIS — Z79899 Other long term (current) drug therapy: Secondary | ICD-10-CM | POA: Insufficient documentation

## 2016-10-26 DIAGNOSIS — Z6837 Body mass index (BMI) 37.0-37.9, adult: Secondary | ICD-10-CM | POA: Insufficient documentation

## 2016-10-26 HISTORY — PX: COLONOSCOPY WITH PROPOFOL: SHX5780

## 2016-10-26 HISTORY — DX: Presence of automatic (implantable) cardiac defibrillator: Z95.810

## 2016-10-26 LAB — GLUCOSE, CAPILLARY: Glucose-Capillary: 223 mg/dL — ABNORMAL HIGH (ref 65–99)

## 2016-10-26 SURGERY — COLONOSCOPY WITH PROPOFOL
Anesthesia: Monitor Anesthesia Care

## 2016-10-26 MED ORDER — LIDOCAINE 2% (20 MG/ML) 5 ML SYRINGE
INTRAMUSCULAR | Status: AC
  Filled 2016-10-26: qty 5

## 2016-10-26 MED ORDER — PROPOFOL 10 MG/ML IV BOLUS
INTRAVENOUS | Status: AC
  Filled 2016-10-26: qty 20

## 2016-10-26 MED ORDER — LACTATED RINGERS IV SOLN
INTRAVENOUS | Status: DC | PRN
  Administered 2016-10-26: 11:00:00 via INTRAVENOUS

## 2016-10-26 MED ORDER — ONDANSETRON HCL 4 MG/2ML IJ SOLN
INTRAMUSCULAR | Status: DC | PRN
  Administered 2016-10-26: 4 mg via INTRAVENOUS

## 2016-10-26 MED ORDER — PROPOFOL 500 MG/50ML IV EMUL
INTRAVENOUS | Status: DC | PRN
  Administered 2016-10-26: 200 ug/kg/min via INTRAVENOUS

## 2016-10-26 MED ORDER — PROPOFOL 10 MG/ML IV BOLUS
INTRAVENOUS | Status: AC
  Filled 2016-10-26: qty 60

## 2016-10-26 MED ORDER — PROPOFOL 10 MG/ML IV BOLUS
INTRAVENOUS | Status: DC | PRN
  Administered 2016-10-26 (×4): 20 mg via INTRAVENOUS

## 2016-10-26 MED ORDER — ONDANSETRON HCL 4 MG/2ML IJ SOLN
INTRAMUSCULAR | Status: AC
  Filled 2016-10-26: qty 2

## 2016-10-26 MED ORDER — SODIUM CHLORIDE 0.9 % IV SOLN: INTRAVENOUS | Status: DC

## 2016-10-26 SURGICAL SUPPLY — 21 items

## 2016-10-26 NOTE — Anesthesia Preprocedure Evaluation (Addendum)
Anesthesia Evaluation  Patient identified by MRN, date of birth, ID band Patient awake    Reviewed: Allergy & Precautions, H&P , NPO status , Patient's Chart, lab work & pertinent test results, reviewed documented beta blocker date and time   Airway Mallampati: III  TM Distance: >3 FB Neck ROM: Full    Dental no notable dental hx. (+) Teeth Intact, Dental Advisory Given   Pulmonary sleep apnea ,    Pulmonary exam normal breath sounds clear to auscultation       Cardiovascular hypertension, Pt. on medications and Pt. on home beta blockers +CHF  + dysrhythmias Atrial Fibrillation + Cardiac Defibrillator  Rhythm:Irregular Rate:Normal  09/2015: Study Conclusions  - Left ventricle: Extremely poor acoustic windows limit study LVEF   is approximately 40% with hypokinesis of the   inferior/inferoseptal walls Would recomm limited study with   contrast to further define wall function. The cavity size was   mildly dilated. Wall thickness was normal. Doppler parameters are   consistent with abnormal left ventricular relaxation (grade 1   diastolic dysfunction). - Aorta: Aortic root is moderately dilated at 46 mm . Sinotubular   junction is 36 mm. Ascending aorta 35 mm. - Mitral valve: Calcified annulus. Mildly thickened leaflets . - Pericardium, extracardiac: A trivial pericardial effusion was   identified.   Neuro/Psych Anxiety negative neurological ROS  negative psych ROS   GI/Hepatic Neg liver ROS, GERD  Medicated and Controlled,  Endo/Other  diabetes  Renal/GU negative Renal ROS  negative genitourinary   Musculoskeletal  (+) Arthritis , Osteoarthritis,    Abdominal   Peds  Hematology negative hematology ROS (+)   Anesthesia Other Findings   Reproductive/Obstetrics negative OB ROS                             Anesthesia Physical  Anesthesia Plan  ASA: III  Anesthesia Plan: MAC    Post-op Pain Management:    Induction: Intravenous  PONV Risk Score and Plan: 1 and Ondansetron, Treatment may vary due to age or medical condition and Propofol infusion  Airway Management Planned: Natural Airway and Simple Face Mask  Additional Equipment:   Intra-op Plan:   Post-operative Plan:   Informed Consent: I have reviewed the patients History and Physical, chart, labs and discussed the procedure including the risks, benefits and alternatives for the proposed anesthesia with the patient or authorized representative who has indicated his/her understanding and acceptance.     Plan Discussed with: CRNA  Anesthesia Plan Comments:        Anesthesia Quick Evaluation

## 2016-10-26 NOTE — Anesthesia Postprocedure Evaluation (Signed)
Anesthesia Post Note  Patient: Aaron Thompson  Procedure(s) Performed: COLONOSCOPY WITH PROPOFOL ([name] )     Patient location during evaluation: PACU Anesthesia Type: MAC Level of consciousness: awake and alert Pain management: pain level controlled Vital Signs Assessment: post-procedure vital signs reviewed and stable Respiratory status: spontaneous breathing, nonlabored ventilation, respiratory function stable and patient connected to nasal cannula oxygen Cardiovascular status: stable and blood pressure returned to baseline Postop Assessment: no apparent nausea or vomiting Anesthetic complications: no    Last Vitals:  Vitals:   10/26/16 1145 10/26/16 1150  BP:  (!) 124/91  Pulse:  81  Resp:  20  Temp:    SpO2: 100% 98%    Last Pain:  Vitals:   10/26/16 1132  TempSrc: Oral                 Tiajuana Amass

## 2016-10-26 NOTE — Transfer of Care (Signed)
Immediate Anesthesia Transfer of Care Note  Patient: Aaron Thompson  Procedure(s) Performed: Procedure(s): COLONOSCOPY WITH PROPOFOL ([name])  Patient Location: ENDO  Anesthesia Type:MAC  Level of Consciousness:  sedated, patient cooperative and responds to stimulation  Airway & Oxygen Therapy:Patient Spontanous Breathing and Patient connected to face mask oxgen  Post-op Assessment:  Report given to ENDO RN and Post -op Vital signs reviewed and stable  Post vital signs:  Reviewed and stable  Last Vitals:  Vitals:   10/26/16 0930  BP: 129/86  Pulse: 67  Resp: 15  Temp: (!) 36.4 C  SpO2: 58%    Complications: No apparent anesthesia complications

## 2016-10-26 NOTE — Op Note (Signed)
[facility] Patient Name: Aaron Thompson Procedure Date: 10/26/2016 MRN: [id number] Attending MD: Docia Chuck. Henrene Pastor , MD Date of Birth: 08/18/1960 CSN: [id number] Age: 56 Admit Type: Outpatient Procedure:                Colonoscopy Indications:              High risk colon cancer surveillance: Personal                            history of sessile serrated colon polyp (less than                            10 mm in size) with no dysplasia. Index examination                            2008 Providers:                Docia Chuck. Henrene Pastor, MD, Cleda Daub, RN, Alan Mulder, Technician Referring MD:             Wynelle Fanny. Glori Bickers, M.D. Medicines:                Monitored Anesthesia Care Complications:            No immediate complications. Estimated blood loss:                            None. Estimated Blood Loss:     Estimated blood loss: none. Procedure:                Pre-Anesthesia Assessment:                           - Prior to the procedure, a History and Physical                            was performed, and patient medications and                            allergies were reviewed. The patient's tolerance of                            previous anesthesia was also reviewed. The risks                            and benefits of the procedure and the sedation                            options and risks were discussed with the patient.                            All questions were answered, and informed consent                            was  obtained. Prior Anticoagulants: The patient has                            taken Xarelto (rivaroxaban), last dose was 3 days                            prior to procedure. ASA Grade Assessment: III - A                            patient with severe systemic disease. After                            reviewing the risks and benefits, the patient was                            deemed in satisfactory condition to undergo  the                            procedure.                           After obtaining informed consent, the colonoscope                            was passed under direct vision. Throughout the                            procedure, the patient's blood pressure, pulse, and                            oxygen saturations were monitored continuously. The                            EC-[id number] ([id number]) scope was introduced through                            the anus and advanced to the the cecum, identified                            by appendiceal orifice and ileocecal valve. The                            ileocecal valve, appendiceal orifice, and rectum                            were photographed. The quality of the bowel                            preparation was good. The colonoscopy was performed                            without difficulty. The patient tolerated the  procedure well. The bowel preparation used was                            SUPREP. Scope In: 11:03:13 AM Scope Out: 11:22:34 AM Scope Withdrawal Time: 0 hours 10 minutes 7 seconds  Total Procedure Duration: 0 hours 19 minutes 21 seconds  Findings:      Scattered diverticula were found in the transverse colon and right colon.      Internal hemorrhoids were found during retroflexion.      The exam was otherwise without abnormality on direct and retroflexion       views. Impression:               - Diverticulosis in the transverse colon and in the                            right colon.                           - Internal hemorrhoids.                           - The examination was otherwise normal on direct                            and retroflexion views.                           - No specimens collected. Moderate Sedation:      none Recommendation:           - Repeat colonoscopy is not recommended for                            surveillance (favorable findings, comorbidities).                            - Patient has a contact number available for                            emergencies. The signs and symptoms of potential                            delayed complications were discussed with the                            patient. Return to normal activities tomorrow.                            Written discharge instructions were provided to the                            patient.                           - Resume previous diet.                           -  Continue present medications.                           - Resume Xarelto today Procedure Code(s):        --- Professional ---                           [phone], Colonoscopy, flexible; diagnostic, including                            collection of specimen(s) by brushing or washing,                            when performed (separate procedure) Diagnosis Code(s):        --- Professional ---                           Z86.010, Personal history of colonic polyps                           K64.8, Other hemorrhoids                           K57.30, Diverticulosis of large intestine without                            perforation or abscess without bleeding CPT copyright 2016 [employer]. All rights reserved. The codes documented in this report are preliminary and upon coder review may  be revised to meet current compliance requirements. Docia Chuck. Henrene Pastor, MD 10/26/2016 11:33:33 AM This report has been signed electronically. Number of Addenda: 0

## 2016-10-26 NOTE — Discharge Instructions (Signed)
YOU HAD AN ENDOSCOPIC PROCEDURE TODAY: Refer to the procedure report and other information in the discharge instructions given to you for any specific questions about what was found during the examination. If this information does not answer your questions, please call [place] office at [phone] to clarify.   YOU SHOULD EXPECT: Some feelings of bloating in the abdomen. Passage of more gas than usual. Walking can help get rid of the air that was put into your GI tract during the procedure and reduce the bloating. If you had a lower endoscopy (such as a colonoscopy or flexible sigmoidoscopy) you may notice spotting of blood in your stool or on the toilet paper. Some abdominal soreness may be present for a day or two, also.  DIET: Your first meal following the procedure should be a light meal and then it is ok to progress to your normal diet. A half-sandwich or bowl of soup is an example of a good first meal. Heavy or fried foods are harder to digest and may make you feel nauseous or bloated. Drink plenty of fluids but you should avoid alcoholic beverages for 24 hours. If you had a esophageal dilation, please see attached instructions for diet.    ACTIVITY: Your care partner should take you home directly after the procedure. You should plan to take it easy, moving slowly for the rest of the day. You can resume normal activity the day after the procedure however YOU SHOULD NOT DRIVE, use power tools, machinery or perform tasks that involve climbing or major physical exertion for 24 hours (because of the sedation medicines used during the test).   SYMPTOMS TO REPORT IMMEDIATELY: A gastroenterologist can be reached at any hour. Please call [phone]  for any of the following symptoms:  Following lower endoscopy (colonoscopy, flexible sigmoidoscopy) Excessive amounts of blood in the stool  Significant tenderness, worsening of abdominal pains  Swelling of the abdomen that is new, acute  Fever of 100 or  higher  Following upper endoscopy (EGD, EUS, ERCP, esophageal dilation) Vomiting of blood or coffee ground material  New, significant abdominal pain  New, significant chest pain or pain under the shoulder blades  Painful or persistently difficult swallowing  New shortness of breath  Black, tarry-looking or red, bloody stools  FOLLOW UP:  If any biopsies were taken you will be contacted by phone or by letter within the next 1-3 weeks. Call [phone]  if you have not heard about the biopsies in 3 weeks.  Please also call with any specific questions about appointments or follow up tests.YOU HAD AN ENDOSCOPIC PROCEDURE TODAY: Refer to the procedure report and other information in the discharge instructions given to you for any specific questions about what was found during the examination. If this information does not answer your questions, please call [place] office at [phone] to clarify.   YOU SHOULD EXPECT: Some feelings of bloating in the abdomen. Passage of more gas than usual. Walking can help get rid of the air that was put into your GI tract during the procedure and reduce the bloating. If you had a lower endoscopy (such as a colonoscopy or flexible sigmoidoscopy) you may notice spotting of blood in your stool or on the toilet paper. Some abdominal soreness may be present for a day or two, also.  DIET: Your first meal following the procedure should be a light meal and then it is ok to progress to your normal diet. A half-sandwich or bowl of soup is an example of a   good first meal. Heavy or fried foods are harder to digest and may make you feel nauseous or bloated. Drink plenty of fluids but you should avoid alcoholic beverages for 24 hours. If you had a esophageal dilation, please see attached instructions for diet.    ACTIVITY: Your care partner should take you home directly after the procedure. You should plan to take it easy, moving slowly for the rest of the day. You can resume  normal activity the day after the procedure however YOU SHOULD NOT DRIVE, use power tools, machinery or perform tasks that involve climbing or major physical exertion for 24 hours (because of the sedation medicines used during the test).   SYMPTOMS TO REPORT IMMEDIATELY: A gastroenterologist can be reached at any hour. Please call [phone]  for any of the following symptoms:  Following lower endoscopy (colonoscopy, flexible sigmoidoscopy) Excessive amounts of blood in the stool  Significant tenderness, worsening of abdominal pains  Swelling of the abdomen that is new, acute  Fever of 100 or higher  Following upper endoscopy (EGD, EUS, ERCP, esophageal dilation) Vomiting of blood or coffee ground material  New, significant abdominal pain  New, significant chest pain or pain under the shoulder blades  Painful or persistently difficult swallowing  New shortness of breath  Black, tarry-looking or red, bloody stools  FOLLOW UP:  If any biopsies were taken you will be contacted by phone or by letter within the next 1-3 weeks. Call [phone]  if you have not heard about the biopsies in 3 weeks.  Please also call with any specific questions about appointments or follow up tests. 

## 2016-10-26 NOTE — H&P (Signed)
HISTORY OF PRESENT ILLNESS:  Aaron Thompson is a 56 y.o. male with multiple medical problems including atrial fibrillation status post ablation, congestive heart failure with ejection fraction less than 20%, status post implantable defibrillator, chronic anticoagulation, diabetes mellitus, obesity, fatty liver, hypertension, and obstructive sleep apnea on CPAP. He is referred today by his primary care provider with chief complaint of eating surveillance colonoscopy and GERD. Patient was previously evaluated for GERD and in August 2008 underwent upper endoscopy. This was normal. He requires daily PPI for control of symptoms. If he misses 2 doses he has significant reflux symptoms. No dysphagia. No Barrett's on previous exam. Currently takes Zegerid. Patient has undergone colonoscopy previously and August 2008. In addition to diverticulosis, he was found to have 2 subcentimeter colon polyps which were serrated adenomas. Follow-up in 5 years recommended. The patient did receive recall letters but had interval health issues related to his heart. He is now stable from a cardiac standpoint and was encouraged to make this appointment to discuss surveillance colonoscopy. His GI review of systems is negative except for GERD. No interval family history of colon cancer.  REVIEW OF SYSTEMS:  All non-GI ROS negative except for sinus allergy, arthritis, hearing impairment, nose bleeds      Past Medical History:  Diagnosis Date  . Anxiety state, unspecified   . Calculus of kidney 1981   "passed it on my own"  . Chronic systolic CHF (congestive heart failure) ([place])   . Dermatophytosis of the body   . DJD (degenerative joint disease)   . Esophageal reflux   . Hx of colonic polyps   . Hypertension   . LBBB (left bundle branch block)    intermittent  . Non-ischemic cardiomyopathy ([place])    a. cath 2013: minor nonobstructive CAD.  . OSA on CPAP   . Osteoarthrosis, unspecified whether generalized  or localized, hand   . Other chronic nonalcoholic liver disease    fatty liver  . PAF (paroxysmal atrial fibrillation) ([place])    a. s/p DCCV 6/11; previously on Pradaxa;  b. Event Monitor 2012->No PAF;  c. 09/2011 s/p DCCV ->Xarelto started. d. 03/2014: inappropriate ICD shocks for AF-RVR, started on Tikosyn, Xarelto restarted.  . Type II diabetes mellitus ([place])   . Unspecified hearing loss          Past Surgical History:  Procedure Laterality Date  . Abdominal US  01/2007   Fatty liver, no gallstones  . APPENDECTOMY    . BI-VENTRICULAR IMPLANTABLE CARDIOVERTER DEFIBRILLATOR [name] 02/14/2013   STJ CRTD implanted by Dr Caryl Comes  . BILATERAL KNEE ARTHROSCOPY    . CARDIOVERSION  09/29/2011   Procedure: CARDIOVERSION;  Surgeon: Thayer Headings, MD;  Location: [facility];  Service: Cardiovascular;  Laterality: [name];  . DOPPLER ECHOCARDIOGRAPHY  11/2009   Decreased EF of 35-40%  . ELECTROPHYSIOLOGIC STUDY [name] 10/14/2015   Procedure: Atrial Fibrillation Ablation;  Surgeon: [name], MD;  Location: [place] CV LAB;  Service: Cardiovascular;  Laterality: [name];  . LEFT HEART CATHETERIZATION WITH CORONARY ANGIOGRAM [name] 08/18/2011   Procedure: LEFT HEART CATHETERIZATION WITH CORONARY ANGIOGRAM;  Surgeon: Peter M Martinique, MD;  Location: [facility] CATH LAB;  Service: Cardiovascular;  Laterality: [name];  . NECK SURGERY     plate/fusion  . Stress Cardiolite  08/2005   Normal, EF 42%  . UPPER GASTROINTESTINAL ENDOSCOPY  08/2006   GERD    Social History Aaron Thompson  reports that he has never smoked. He has quit using smokeless tobacco. His  smokeless tobacco use included Snuff. He reports that he does not drink alcohol or use drugs.  family history includes Cirrhosis in his maternal uncle; Colon cancer in his paternal grandmother; Diabetes in his brother, brother, brother, and mother; Heart attack in his paternal grandfather; Heart failure in his father and mother; Hypertension in his  father and mother; Kidney disease in his brother; Leukemia in his sister; Multiple myeloma in his sister; Other in his sister; Prostate cancer in his brother and father; Stroke in his paternal grandmother.  No Known Allergies     PHYSICAL EXAMINATION: Vital signs: BP 110/70   Pulse 96   Ht 6' 3.25" (1.911 m)   Wt (!) 302 lb 12.8 oz (137.3 kg)   BMI 37.60 kg/m   Constitutional:Pleasant, generally well-appearing, no acute distress Psychiatric: alert and oriented x3, cooperative Eyes: extraocular movements intact, anicteric, conjunctiva pink Mouth: oral pharynx moist, no lesions Neck: supple no lymphadenopathy Cardiovascular: heart regular rate and rhythm, no murmur Lungs: clear to auscultation bilaterally Abdomen: soft, obese, nontender, nondistended, no obvious ascites, no peritoneal signs, normal bowel sounds, no organomegaly Rectal: Deferred until colonoscopy Extremities: no clubbing cyanosis or lower extremity edema bilaterally Skin: no lesions on visible extremities Neuro: Impaired hearing. Otherwise, No focal deficits. No asterixis.    ASSESSMENT:  #1. History of sessile serrated polyps. Overdue for follow-up #2. GERD without esophagitis requiring PPI for control #3. Multiple medical problems as outlined including congestive heart failure, diabetes mellitus and the need for chronic anticoagulation with Xarelto   PLAN:  #1. Reflux precautions #2. Continue PPI for control of GERD symptoms #3. Schedule surveillance colonoscopy. The patient is HIGH RISK given his comorbidities including significant reduced cardiac ejection fraction and the need to address his diabetic medications and anticoagulation. The examination will need to be scheduled at the hospital with MAC/propofol.The nature of the procedure, as well as the risks, benefits, and alternatives were carefully and thoroughly reviewed with the patient. Ample time for discussion and questions allowed. The patient  understood, was satisfied, and agreed to proceed. #4. Hold diabetic medications the day of the procedure to avoid and wanted hypoglycemia #5. Hold Xarelto 2 days prior to the procedure. We will check with Dr. [name] to make sure this is acceptable  GI ATTENDING  Interval history data reviewed. Patient seen and examined. No interval problems or significant clinical issues since previous evaluation as outlined above. Patient now for surveillance colonoscopy.The nature of the procedure, as well as the risks, benefits, and alternatives were carefully and thoroughly reviewed with the patient. Ample time for discussion and questions allowed. The patient understood, was satisfied, and agreed to proceed.  Docia Chuck. Geri [place]in[place]., M.D. [facility] Division of Gastroenterology

## 2016-10-27 ENCOUNTER — Encounter (HOSPITAL_COMMUNITY): Payer: Self-pay | Admitting: Internal Medicine

## 2016-11-30 ENCOUNTER — Other Ambulatory Visit: Payer: Self-pay | Admitting: Cardiovascular Disease

## 2016-11-30 NOTE — Telephone Encounter (Signed)
Pt's pharmacy is requesting a refill on tramadol 50 mg tablet. Would you like to refill this medication? Please advise

## 2016-12-06 ENCOUNTER — Telehealth: Payer: Self-pay | Admitting: Cardiology

## 2016-12-06 NOTE — Telephone Encounter (Signed)
Spoke w/ pt and requested that he send a manual transmission b/c his home monitor has not updated in at least 7 days.   

## 2016-12-14 ENCOUNTER — Encounter: Payer: Self-pay | Admitting: Internal Medicine

## 2016-12-14 ENCOUNTER — Ambulatory Visit: Payer: BLUE CROSS/BLUE SHIELD | Admitting: Internal Medicine

## 2016-12-14 VITALS — BP 128/84 | HR 100 | Ht 76.25 in | Wt 307.0 lb

## 2016-12-14 DIAGNOSIS — I5022 Chronic systolic (congestive) heart failure: Secondary | ICD-10-CM | POA: Diagnosis not present

## 2016-12-14 DIAGNOSIS — I480 Paroxysmal atrial fibrillation: Secondary | ICD-10-CM | POA: Diagnosis not present

## 2016-12-14 DIAGNOSIS — I428 Other cardiomyopathies: Secondary | ICD-10-CM | POA: Diagnosis not present

## 2016-12-14 DIAGNOSIS — Z4502 Encounter for adjustment and management of automatic implantable cardiac defibrillator: Secondary | ICD-10-CM | POA: Diagnosis not present

## 2016-12-14 DIAGNOSIS — R0609 Other forms of dyspnea: Secondary | ICD-10-CM

## 2016-12-14 DIAGNOSIS — R0600 Dyspnea, unspecified: Secondary | ICD-10-CM

## 2016-12-14 LAB — CUP PACEART INCLINIC DEVICE CHECK
Brady Statistic RA Percent Paced: 0.28 %
Brady Statistic RV Percent Paced: 98 %
HIGH POWER IMPEDANCE MEASURED VALUE: 95.625
Implantable Lead Implant Date: 20150121
Implantable Lead Implant Date: 20150121
Implantable Lead Location: 753858
Lead Channel Impedance Value: 487.5 Ohm
Lead Channel Pacing Threshold Amplitude: 0.5 V
Lead Channel Pacing Threshold Amplitude: 1 V
Lead Channel Pacing Threshold Amplitude: 2 V
Lead Channel Pacing Threshold Pulse Width: 0.5 ms
Lead Channel Pacing Threshold Pulse Width: 0.5 ms
Lead Channel Pacing Threshold Pulse Width: 0.5 ms
Lead Channel Pacing Threshold Pulse Width: 0.8 ms
Lead Channel Sensing Intrinsic Amplitude: 11.8 mV
Lead Channel Sensing Intrinsic Amplitude: 5 mV
Lead Channel Setting Pacing Amplitude: 1.625
Lead Channel Setting Pacing Pulse Width: 0.8 ms
Lead Channel Setting Sensing Sensitivity: 0.5 mV
MDC IDC LEAD IMPLANT DT: 20150121
MDC IDC LEAD LOCATION: 753859
MDC IDC LEAD LOCATION: 753860
MDC IDC MSMT BATTERY REMAINING LONGEVITY: 21 mo
MDC IDC MSMT LEADCHNL LV IMPEDANCE VALUE: 987.5 Ohm
MDC IDC MSMT LEADCHNL LV PACING THRESHOLD AMPLITUDE: 2 V
MDC IDC MSMT LEADCHNL LV PACING THRESHOLD PULSEWIDTH: 0.8 ms
MDC IDC MSMT LEADCHNL RA PACING THRESHOLD AMPLITUDE: 0.5 V
MDC IDC MSMT LEADCHNL RA PACING THRESHOLD PULSEWIDTH: 0.5 ms
MDC IDC MSMT LEADCHNL RV IMPEDANCE VALUE: 537.5 Ohm
MDC IDC MSMT LEADCHNL RV PACING THRESHOLD AMPLITUDE: 1 V
MDC IDC PG IMPLANT DT: 20150121
MDC IDC PG SERIAL: 7133975
MDC IDC SESS DTM: 20181120142951
MDC IDC SET LEADCHNL LV PACING AMPLITUDE: 3.5 V
MDC IDC SET LEADCHNL RV PACING AMPLITUDE: 2 V
MDC IDC SET LEADCHNL RV PACING PULSEWIDTH: 0.5 ms

## 2016-12-14 MED ORDER — FUROSEMIDE 20 MG PO TABS: 20.0000 mg | ORAL_TABLET | Freq: Every day | ORAL | 0 refills | Status: DC

## 2016-12-14 NOTE — Progress Notes (Signed)
Patient Care Team: Tower, Wynelle Fanny, MD as PCP - General   HPI  Aaron Thompson is a 56 y.o. male Seen in followup for  ICD implanted for nonischemic cardiomyopathy and left bundle branch block.  He was noted on  his device to have atrial fibrillation and he was started on Rivaroxaban for stroke reduction prophylaxis   Chose to stop his anticoagulation and to return to work. March 2016 he had ICD shocks associated with very rapid atrial fibrillation.  He is now on dofetilide therapy as well as back on Rivaroxaban   He is complaining of increasing exercise intolerance over the last few months.  This is been unassociated with peripheral edema nocturnal dyspnea or chest pain.  He has not noted palpitations.  S   DATE TEST    *9**/17 Echo   EF 40 %                 Past Medical History:  Diagnosis Date  . AICD (automatic cardioverter/defibrillator) present   . Anxiety state, unspecified   . Calculus of kidney 1981   "passed it on my own"  . Chronic systolic CHF (congestive heart failure) ([place])   . Dermatophytosis of the body   . DJD (degenerative joint disease)   . Esophageal reflux   . Hx of colonic polyps   . Hypertension   . LBBB (left bundle branch block)    intermittent  . Non-ischemic cardiomyopathy ([place])    a. cath 2013: minor nonobstructive CAD.  . OSA on CPAP    pt can't use it.  . Osteoarthrosis, unspecified whether generalized or localized, hand   . Other chronic nonalcoholic liver disease    fatty liver  . PAF (paroxysmal atrial fibrillation) ([place])    a. s/p DCCV 6/11; previously on Pradaxa;  b. Event Monitor 2012->No PAF;  c. 09/2011 s/p DCCV ->Xarelto started. d. 03/2014: inappropriate ICD shocks for AF-RVR, started on Tikosyn, Xarelto restarted.  . Type II diabetes mellitus ([place])   . Unspecified hearing loss    no hearing aid    Past Surgical History:  Procedure Laterality Date  . Abdominal US  01/2007   Fatty liver, no gallstones  . APPENDECTOMY     . ATRIAL TACH ABLATION  09/2015  . BI-VENTRICULAR IMPLANTABLE CARDIOVERTER DEFIBRILLATOR [name] 02/14/2013   STJ CRTD implanted by Dr Caryl Comes  . BILATERAL KNEE ARTHROSCOPY    . CARDIOVERSION  09/29/2011   Procedure: CARDIOVERSION;  Surgeon: Thayer Headings, MD;  Location: [facility];  Service: Cardiovascular;  Laterality: [name];  . COLONOSCOPY WITH PROPOFOL [name] 10/26/2016   Procedure: COLONOSCOPY WITH PROPOFOL;  Surgeon: Irene Shipper, MD;  Location: [facility];  Service: Endoscopy;  Laterality: [name];  . DOPPLER ECHOCARDIOGRAPHY  11/2009   Decreased EF of 35-40%  . ELECTROPHYSIOLOGIC STUDY [name] 10/14/2015   Procedure: Atrial Fibrillation Ablation;  Surgeon: [name], MD;  Location: [facility];  Service: Cardiovascular;  Laterality: [name];  . LEFT HEART CATHETERIZATION WITH CORONARY ANGIOGRAM [name] 08/18/2011   Procedure: LEFT HEART CATHETERIZATION WITH CORONARY ANGIOGRAM;  Surgeon: Peter M Martinique, MD;  Location: [facility] CATH LAB;  Service: Cardiovascular;  Laterality: [name];  . NECK SURGERY     plate/fusion  . Stress Cardiolite  08/2005   Normal, EF 42%  . UPPER GASTROINTESTINAL ENDOSCOPY  08/2006   GERD    Current Outpatient Medications  Medication Sig Dispense Refill  . carvedilol (COREG) 25 MG tablet Take 2 tablets (50 mg  total) by mouth 2 (two) times daily with a meal. 360 tablet 3  . cetirizine (ZYRTEC) 10 MG tablet Take 10 mg by mouth daily as needed for allergies.    [name] dofetilide (TIKOSYN) 500 MCG capsule TAKE 1 CAPSULE(500 MCG) BY MOUTH TWICE DAILY 180 capsule 3  . glipiZIDE (GLUCOTROL XL) 5 MG 24 hr tablet TAKE 1 TABLET(5 MG) BY MOUTH DAILY WITH BREAKFAST 30 tablet 11  . glucose blood (ONE TOUCH ULTRA TEST) test strip CHECK GLUCOSE TWICE DAILY AND AS NEEDED, DX. E11.65 (UNCONTROLLED DM) 100 each 5  . Glycerin-Hypromellose-PEG 400 (VISINE TIRED EYE RELIEF OP) Place 1-2 drops into both eyes 3 (three) times daily as needed (for dry/irritated eye).    [name] losartan (COZAAR) 50 MG tablet TAKE 1  TABLET(50 MG) BY MOUTH DAILY 30 tablet 9  . metFORMIN (GLUCOPHAGE XR) 500 MG 24 hr tablet Take 3 tablets (1,500 mg total) by mouth daily with breakfast. 90 tablet 11  . methocarbamol (ROBAXIN) 500 MG tablet Take 1 tablet (500 mg total) by mouth 3 (three) times daily as needed for muscle spasms. 40 tablet 0  . Multiple Vitamin (MULTIVITAMIN) tablet Take 1 tablet by mouth daily.      [name] Bicarbonate (ZEGERID OTC) 20-1100 MG CAPS Take 1 capsule by mouth 2 (two) times daily.     . Potassium Chloride ER 20 MEQ TBCR Take 40 mEq by mouth daily.  3  . spironolactone (ALDACTONE) 25 MG tablet TAKE 1 TABLET(25 MG) BY MOUTH DAILY 30 tablet 7  . traMADol (ULTRAM) 50 MG tablet TAKE 1 TABLET BY MOUTH TWICE DAILY 60 tablet 0  . XARELTO 20 MG TABS tablet TAKE 1 TABLET(20 MG) BY MOUTH DAILY WITH SUPPER 30 tablet 5   No current facility-administered medications for this visit.     No Known Allergies  Review of Systems negative except from HPI and PMH  Physical Exam BP 128/84   Pulse 100   Ht 6' 4.25" (1.937 m)   Wt (!) 307 lb (139.3 kg)   SpO2 95%   BMI 37.12 kg/m  Well developed and well nourished in no acute distress HENT normal E scleral and icterus clear Neck Supple JVP 6-7 cm carotids brisk and full Clear to ausculation  Regular rate and rhythm, no murmurs gallops or rub Soft with active bowel sounds No clubbing cyanosis tr Edema Alert and oriented, grossly normal motor and sensory function Skin Warm and Dry  ECG demonstrates sinus rhythm at 100 There is P synchronous pacing with a negative QRS in lead I and an RS in lead V1  Assessment and  Plan  Nonischemic cardiomyopathy  Congestive heart failure-acute chronic-systolic  Hypertension  Implantable defibrillator-CRT   Sleep disorder breathing  Atrial fibrillation  Inappropriate ICD discharges    Blood pressure is reasonably controlled.  The mechanism of his dyspnea is not clear.  I certainly would worry  about deteriorating LV function.  Resynchronization appears to be reasonable based on the ECG.  We will repeat an echocardiogram and check a BNP.  There is mild volume overload.  We will give him furosemide to take 20 mg 3 days in a row.  With his tachycardia evidence on his ECG, I compared histograms over the last year.  They are not particularly right shifted apart from about 1%.  However, the tachycardia today may be relatively new.  We will check a TSH as well as a CBC.

## 2016-12-14 NOTE — Patient Instructions (Addendum)
Medication Instructions: Your physician has recommended you make the following change in your medication:  -1) TAKE Lasix (Furosemide) - Take 1 tablet by mouth for the next 3 days - RX Sent  Labwork: Your physician has recommended that you have lab work today: BMET, TSH, CBC, and Pro BNP  Procedures/Testing: Your physician has requested that you have an echocardiogram. Echocardiography is a painless test that uses sound waves to create images of your heart. It provides your doctor with information about the size and shape of your heart and how well your heart's chambers and valves are working. This procedure takes approximately one hour. There are no restrictions for this procedure.  Follow-Up: Your physician wants you to follow-up in: 1 YEAR with [name], NP. You will receive a reminder letter in the mail two months in advance. If you don't receive a letter, please call our office to schedule the follow-up appointment.  Remote monitoring is used to monitor your ICD from home. This monitoring reduces the number of office visits required to check your device to one time per year. It allows [place] to keep an eye on the functioning of your device to ensure it is working properly. You are scheduled for a device check from home on 03/15/17. You may send your transmission at any time that day. If you have a wireless device, the transmission will be sent automatically. After your physician reviews your transmission, you will receive a postcard with your next transmission date.   If you need a refill on your cardiac medications before your next appointment, please call your pharmacy.

## 2016-12-15 ENCOUNTER — Other Ambulatory Visit: Payer: Self-pay | Admitting: Nurse Practitioner

## 2016-12-15 LAB — CBC WITH DIFFERENTIAL/PLATELET
BASOS ABS: 0 10*3/uL (ref 0.0–0.2)
Basos: 0 %
EOS (ABSOLUTE): 0.1 10*3/uL (ref 0.0–0.4)
Eos: 2 %
HEMOGLOBIN: 15.3 g/dL (ref 13.0–17.7)
Hematocrit: 46.1 % (ref 37.5–51.0)
Immature Grans (Abs): 0 10*3/uL (ref 0.0–0.1)
Immature Granulocytes: 0 %
LYMPHS ABS: 1.7 10*3/uL (ref 0.7–3.1)
Lymphs: 20 %
MCH: 28.8 pg (ref 26.6–33.0)
MCHC: 33.2 g/dL (ref 31.5–35.7)
MCV: 87 fL (ref 79–97)
Monocytes Absolute: 0.6 10*3/uL (ref 0.1–0.9)
Monocytes: 8 %
NEUTROS ABS: 5.7 10*3/uL (ref 1.4–7.0)
Neutrophils: 70 %
PLATELETS: 215 10*3/uL (ref 150–379)
RBC: 5.31 x10E6/uL (ref 4.14–5.80)
RDW: 13.9 % (ref 12.3–15.4)
WBC: 8.2 10*3/uL (ref 3.4–10.8)

## 2016-12-15 LAB — BASIC METABOLIC PANEL
BUN / CREAT RATIO: 19 (ref 9–20)
BUN: 17 mg/dL (ref 6–24)
CHLORIDE: 100 mmol/L (ref 96–106)
CO2: 24 mmol/L (ref 20–29)
Calcium: 8.9 mg/dL (ref 8.7–10.2)
Creatinine, Ser: 0.88 mg/dL (ref 0.76–1.27)
GFR calc Af Amer: 112 mL/min/{1.73_m2} (ref >59)
GFR calc non Af Amer: 97 mL/min/{1.73_m2} (ref >59)
GLUCOSE: 242 mg/dL — AB (ref 65–99)
Potassium: 4.7 mmol/L (ref 3.5–5.2)
SODIUM: 140 mmol/L (ref 134–144)

## 2016-12-15 LAB — PRO B NATRIURETIC PEPTIDE: NT-PRO BNP: 99 pg/mL (ref 0–210)

## 2016-12-15 LAB — TSH: TSH: 1.38 u[IU]/mL (ref 0.450–4.500)

## 2016-12-20 ENCOUNTER — Encounter: Payer: Self-pay | Admitting: Family Medicine

## 2016-12-20 ENCOUNTER — Ambulatory Visit: Payer: BLUE CROSS/BLUE SHIELD | Admitting: Family Medicine

## 2016-12-20 ENCOUNTER — Ambulatory Visit (HOSPITAL_COMMUNITY): Payer: BLUE CROSS/BLUE SHIELD

## 2016-12-20 DIAGNOSIS — J0100 Acute maxillary sinusitis, unspecified: Secondary | ICD-10-CM

## 2016-12-20 MED ORDER — HYDROCODONE-HOMATROPINE 5-1.5 MG/5ML PO SYRP: 5.0000 mL | ORAL_SOLUTION | Freq: Three times a day (TID) | ORAL | 0 refills | Status: DC | PRN

## 2016-12-20 MED ORDER — BENZONATATE 200 MG PO CAPS: 200.0000 mg | ORAL_CAPSULE | Freq: Three times a day (TID) | ORAL | 1 refills | Status: DC | PRN

## 2016-12-20 MED ORDER — DOXYCYCLINE HYCLATE 100 MG PO TABS: 100.0000 mg | ORAL_TABLET | Freq: Two times a day (BID) | ORAL | 0 refills | Status: DC

## 2016-12-20 NOTE — Progress Notes (Signed)
duration of symptoms: sx started about 1 week ago Rhinorrhea: yes Congestion: yes ear pain: yes, pressure/fluid in the ears sore throat: off and on Cough:yes, with discolored sputum.   Myalgias: prev yes, some better now.   Fevers: none known, but has been taking tylenol.   other concerns: sx getting worse over the last week Now with green sputum.   Has been taking tussionex with relief of the cough.  Facial, max>frontal pain.   Per HPI unless specifically indicated in ROS section   Meds, vitals, and allergies reviewed.   GEN: nad, alert and oriented HEENT: mucous membranes moist, TM w/o erythema, nasal epithelium injected, OP with cobblestoning, max > frontal sinuses ttp B NECK: supple w/o LA CV: rrr. PULM: ctab, no inc wob ABD: soft, +bs EXT: no edema

## 2016-12-20 NOTE — Patient Instructions (Signed)
Start doxycycline.  Use tessalon for cough.  Use the cough syrup if needed.  Rest and fluids.  Take care.  Glad to see you.  Update [place] as needed.

## 2016-12-21 DIAGNOSIS — J0100 Acute maxillary sinusitis, unspecified: Secondary | ICD-10-CM | POA: Insufficient documentation

## 2016-12-21 NOTE — Assessment & Plan Note (Signed)
Nontoxic, start doxy. Tessalon for cough, if not improved can use hycodan with routine cautions.  Ctab.  Okay for outpatient f/u.  He agrees.

## 2016-12-23 ENCOUNTER — Other Ambulatory Visit: Payer: Self-pay

## 2016-12-23 ENCOUNTER — Ambulatory Visit (HOSPITAL_COMMUNITY): Payer: BLUE CROSS/BLUE SHIELD | Attending: Internal Medicine

## 2016-12-23 DIAGNOSIS — R0609 Other forms of dyspnea: Secondary | ICD-10-CM | POA: Insufficient documentation

## 2016-12-23 DIAGNOSIS — R0600 Dyspnea, unspecified: Secondary | ICD-10-CM

## 2016-12-23 MED ORDER — PERFLUTREN LIPID MICROSPHERE
1.0000 mL | INTRAVENOUS | Status: AC | PRN
  Administered 2016-12-23: 3 mL via INTRAVENOUS

## 2016-12-24 NOTE — Progress Notes (Signed)
Cardiology Office Note Date:  12/28/2016  Patient ID:  Aaron Thompson, Aaron Thompson [birth year], MRN [id number] PCP:  Abner Greenspan, MD  Electrophysiologist: Dr. Caryl Comes Cardiologist: Dr. [name]   Chief Complaint: F/U PAF   History of Present Illness: Aaron Thompson is a 56 y.o. male with history of PAfib, NICM, HTN, morbid obesity, OSA, reports unable to tolerate CPAP, DM, LBBB   Hospital stay from  07/29/15 he was seen in the ER with palpitations/SOB that woke him, he was found to be in AFib with RVR and had DCCV in the ER.  No changes were made to f/u with sleep study and apnea tx.  He was unfortunately hospitalized again with RAFib 08/12/15 and had DCCV in ED with ERAF the same day and returned to the ED and was admitted, he was observed to have bothe AFib and ATach.   his coreg further up-titrated and ranolazine added.[name]  His K+ was increased with hypokalemia again noted.  Subsequently seen by JA for afib ablation that was done on 10/14/15.  Ranexa stopped and plans were to stop tikosyn soon. Had recurrent AICD shocks due to rapid afib and placed back on tikosyn and anticoagulation Seen by Dr Caryl Comes 12/14/16 more tachycardic and dyspnea Thyroid BNP and Hct Normal 12/14/16 RX lasix for 3 days   Echo 12/14/16 EF 20-25% Aortic root 40  Has felt poorly since September HR higher more fatigue and dyspnea Coincides with more PAF Discussed need to upgrade device to CRT and change meds to [facility] He also has another Disability hearing coming up. 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 AICD (automatic cardioverter/defibrillator) present   . Anxiety state, unspecified   . Calculus of kidney 1981   "passed it on my own"  . Chronic systolic CHF (congestive heart failure) ([place])   . Dermatophytosis of the body   . DJD (degenerative joint disease)   . Esophageal reflux   . Hx of colonic polyps   . Hypertension   . LBBB (left bundle branch block)    intermittent  . Non-ischemic cardiomyopathy ([place])    a. cath 2013: minor nonobstructive CAD.  . OSA on CPAP    pt can't use it.  . Osteoarthrosis, unspecified whether generalized or localized, hand   . Other chronic nonalcoholic liver disease    fatty liver  . PAF (paroxysmal atrial fibrillation) ([place])    a. s/p DCCV 6/11; previously on Pradaxa;  b. Event Monitor 2012->No PAF;  c. 09/2011 s/p DCCV ->Xarelto started. d. 03/2014: inappropriate ICD shocks for AF-RVR, started on Tikosyn, Xarelto restarted.  . Type II diabetes mellitus ([place])   . Unspecified hearing loss    no hearing aid    Past Surgical History:  Procedure Laterality Date  . Abdominal US  01/2007   Fatty liver, no gallstones  . APPENDECTOMY    . ATRIAL TACH ABLATION  09/2015  . BI-VENTRICULAR IMPLANTABLE CARDIOVERTER DEFIBRILLATOR [name] 02/14/2013   STJ  CRTD implanted by Dr Caryl Comes  . BILATERAL KNEE ARTHROSCOPY    . CARDIOVERSION  09/29/2011   Procedure: CARDIOVERSION;  Surgeon: Thayer Headings, MD;  Location: [facility];  Service: Cardiovascular;  Laterality: [name];  . COLONOSCOPY WITH PROPOFOL [name] 10/26/2016   Procedure: COLONOSCOPY WITH PROPOFOL;  Surgeon: Irene Shipper, MD;  Location: [facility];  Service: Endoscopy;  Laterality: [name];  . DOPPLER ECHOCARDIOGRAPHY  11/2009   Decreased EF of 35-40%  . ELECTROPHYSIOLOGIC STUDY [name] 10/14/2015   Procedure: Atrial Fibrillation Ablation;  Surgeon: [name], MD;  Location: [place] [facility];  Service: Cardiovascular;  Laterality: [name];  .  LEFT HEART CATHETERIZATION WITH CORONARY ANGIOGRAM [name] 08/18/2011   Procedure: LEFT HEART CATHETERIZATION WITH CORONARY ANGIOGRAM;  Surgeon: Sherisse Fullilove M Martinique, MD;  Location: [facility] C[facility] LAB;  Service: Cardiovascular;  Laterality: [name];  . NECK SURGERY     plate/fusion  . Stress Cardiolite  08/2005   Normal, EF 42%  . UPPER GASTROINTESTINAL ENDOSCOPY  08/2006   GERD    Current Outpatient Medications  Medication Sig Dispense Refill  . carvedilol (COREG) 25 MG tablet Take 2 tablets (50 mg total) by mouth 2 (two) times daily with a meal. 360 tablet 3  . cetirizine (ZYRTEC) 10 MG tablet Take 10 mg by mouth daily as needed for allergies.    [name] dofetilide (TIKOSYN) 500 MCG capsule TAKE 1 CAPSULE(500 MCG) BY MOUTH TWICE DAILY 180 capsule 3  . glipiZIDE (GLUCOTROL XL) 5 MG 24 hr tablet TAKE 1 TABLET(5 MG) BY MOUTH DAILY WITH BREAKFAST 30 tablet 11  . glucose blood (ONE TOUCH ULTRA TEST) test strip CHECK GLUCOSE TWICE DAILY AND AS NEEDED, DX. E11.65 (UNCONTROLLED DM) 100 each 5  . Glycerin-Hypromellose-PEG 400 (VISINE TIRED EYE RELIEF OP) Place 1-2 drops into both eyes 3 (three) times daily as needed (for dry/irritated eye).    . metFORMIN (GLUCOPHAGE XR) 500 MG 24 hr tablet Take 3 tablets (1,500 mg total) by mouth daily with breakfast. 90 tablet 11  . Multiple Vitamin (MULTIVITAMIN) tablet Take 1 tablet by mouth daily.      [name] Bicarbonate (ZEGERID OTC) 20-1100 MG CAPS Take 1 capsule by mouth 2 (two) times daily.     . Potassium Chloride ER 20 MEQ TBCR Take 40 mEq by mouth daily.  3  . spironolactone (ALDACTONE) 25 MG tablet TAKE 1 TABLET(25 MG) BY MOUTH DAILY 30 tablet 7  . traMADol (ULTRAM) 50 MG tablet TAKE 1 TABLET BY MOUTH TWICE DAILY 60 tablet 0  . XARELTO 20 MG TABS tablet TAKE 1 TABLET(20 MG) BY MOUTH DAILY WITH SUPPER 30 tablet 5   No current facility-administered medications for this visit.     Allergies:   Patient has no known allergies.   Social History:  The patient  reports  that  has never smoked. He quit smokeless tobacco use about 12 years ago. His smokeless tobacco use included snuff. He reports that he does not drink alcohol or use drugs.   Family History:  The patient's family history includes Cirrhosis in his maternal uncle; Colon cancer in his paternal grandmother; Colon polyps in his sister; Diabetes in his brother, brother, brother, and mother; Heart attack in his paternal grandfather; Heart failure in his father and mother; Hypertension in his father and mother; Kidney disease in his brother; Leukemia in his sister; Multiple myeloma in his sister; Other in his sister; Prostate cancer in his brother and father; Stroke in his paternal grandmother.  ROS:  Please see the  history of present illness.    All other systems are reviewed and otherwise negative.   PHYSICAL EXAM:  VS:  BP 138/80   Pulse 84   Ht '6\' 4"'  (1.93 m)   Wt (!) 303 lb (137.4 kg)   SpO2 98%   BMI 36.88 kg/m  BMI: Body mass index is 36.88 kg/m. Affect appropriate Overweight white male  HEENT: normal Neck supple with no adenopathy JVP normal no bruits no thyromegaly Lungs clear with no wheezing and good diaphragmatic motion Heart:  S1/S2 no murmur, no rub, gallop or click PMI increased  Abdomen: benighn, BS positve, no tenderness, no AAA no bruit.  No HSM or HJR Distal pulses intact with no bruits No edema Neuro non-focal Skin warm and dry No muscular weakness   ICD site is stable, no tethering or discomfort   EKG:  Done today and reviewed by myself shows SR, V paced,  QTc stable ICD interrogation today: Normal device function, battery status OK, 98% BiVe pacing, 2 very brief ATach episodes, 3 and 2 seconds since last eval in the hospital, no VT  Echo 12/23/16 EF 20-25% Aortic root 4.7    09/22/11; CPX Conclusion: Exercise testing with gas exchange demonstrated a normal functional capacity when compared to matched sedentary norms. There is no evidence of a cardiovascular  limitation to the exercise. Based on his absolute oxygen uptake, the patient is actually in quite good cardiovascular shape however his actual function capacity is markedly limited by his obesity and related ventilatory limitation.  Recent Labs: Lab Results  Component Value Date   CREATININE 0.88 12/14/2016   BUN 17 12/14/2016   NA 140 12/14/2016   K 4.7 12/14/2016   CL 100 12/14/2016   CO2 24 12/14/2016     Estimated Creatinine Clearance: 143.5 mL/min (by C-G formula based on SCr of 0.88 mg/dL).   Wt Readings from Last 3 Encounters:  12/28/16 (!) 303 lb (137.4 kg)  12/20/16 (!) 303 lb (137.4 kg)  12/14/16 (!) 307 lb (139.3 kg)     Other studies reviewed: Additional studies/records reviewed today include: summarized above  DEVICE information: SJM CRT-D, implanted 12/25/13, Dr. Caryl Comes The patient has had inappropriate shocks for rapid AF  ASSESSMENT AND PLAN:  1. PAFib    CHA2DS2Vasc is at least 3, on Xarelto    Multiple DCCV   Still on Tikosyn post ablation due to AIcd shocks from rapid afib       2. NICM/ICD     normal device function     98% bive paced     corvue is down, though so is his weight, exam is euvolemic and no symptoms of fluid OL     Will get him into see SK to upgrade device to CRT Start Entresto and stop Cozaar f/u      BMET 2 weeks see CHF clinic to titrate Entresto   3. HTN     stable  4. Obesity, OSA     reports CPAP unable to tolerate mask, would like to visit with sleep apnea specialist to discuss options, he thinks his AF more often triggered at night  5. 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

## 2016-12-28 ENCOUNTER — Encounter: Payer: Self-pay | Admitting: Cardiovascular Disease

## 2016-12-28 ENCOUNTER — Telehealth: Payer: Self-pay

## 2016-12-28 ENCOUNTER — Ambulatory Visit: Payer: BLUE CROSS/BLUE SHIELD | Admitting: Cardiovascular Disease

## 2016-12-28 VITALS — BP 138/80 | HR 84 | Ht 76.0 in | Wt 303.0 lb

## 2016-12-28 DIAGNOSIS — I5043 Acute on chronic combined systolic (congestive) and diastolic (congestive) heart failure: Secondary | ICD-10-CM | POA: Diagnosis not present

## 2016-12-28 MED ORDER — SACUBITRIL-VALSARTAN 24-26 MG PO TABS: 1.0000 | ORAL_TABLET | Freq: Two times a day (BID) | ORAL | 11 refills | Status: DC

## 2016-12-28 NOTE — Telephone Encounter (Signed)
-----   Message from Deboraha Sprang, MD sent at 12/26/2016 11:32 AM EST ----- Please Inform Patient Echo showed interval worsening of heart muscle function  We need to get him to see APor PN  to begin him on entresto and then will need to consider CRT upgrade bpro sooner than later F/u SK 2-3 mon

## 2016-12-28 NOTE — Telephone Encounter (Signed)
**Note De-identified Aaron Thompson Obfuscation** I have done an Entresto PA through covermymeds. 

## 2016-12-28 NOTE — Telephone Encounter (Signed)
Pt saw Dr. [name] in office 12/28/16. He is aware and agreeable to plan and will be referred to CHF clinic as well as scehduled to see Dr. Caryl Comes 1st available for CRT upgrade.

## 2016-12-28 NOTE — Patient Instructions (Signed)
Medication Instructions:  Your physician has recommended you make the following change in your medication:  1-STOP Cozaar  2-START Entresto 24/26 mg by mouth twice daily  Labwork: NONE  Testing/Procedures: NONE  Follow-Up: Your physician recommends that you schedule a follow-up appointment 6 weeks with Heart Failure Clinic to titrate Entresto.  Your physician recommends that you schedule a follow-up appointment as soon as possible with Dr. Caryl Comes for upgrade.  Your physician wants you to follow-up in: 3 months with Dr. [name].  If you need a refill on your cardiac medications before your next appointment, please call your pharmacy.

## 2016-12-29 NOTE — Telephone Encounter (Signed)
Entresto PA approved per message from covermymeds. Approval good from 12/28/16 until 01/24/2038.

## 2016-12-30 ENCOUNTER — Telehealth: Payer: Self-pay | Admitting: Cardiovascular Disease

## 2016-12-30 NOTE — Telephone Encounter (Signed)
Called patient to let him know that his device is working fine and he has had no episodes. Informed patient that the pharmacist does not think his SOB and CP is from [place]. Patient stated he is feeling fine now, and his CP might be from digging a hole to bury his dog yesterday. Patient stated the only reason he called was because his wife told him to call. Informed patient that his message would be sent to Dr. [name] for further advisement. Patient stated "let Dr. [name] know my wife was worried, that's why I called."

## 2016-12-30 NOTE — Telephone Encounter (Signed)
Patient complaining about SOB with activity and chest pain, patient thinks it might be muscle. Patient stated these symptoms started today. Patient stated he just started his Entresto yesterday. Patient has a device and was wondering if anything was going on with his rhythm. Will send message to device to see if they can check. Will talk to pharmacy about [facility] as well. Patient does not feel like he needs to go to ED. Patient was wondering if he should just stop the entresto and go back to Cozaar.

## 2016-12-30 NOTE — Telephone Encounter (Signed)
New Message     Pt c/o medication issue:  1. Name of Medication:  Entresto   2. How are you currently taking this medication (dosage and times per day)? 2x a day  3. Are you having a reaction (difficulty breathing--STAT)?  yes  4. What is your medication issue? He is having chest pain and making him not feel well

## 2016-12-30 NOTE — Telephone Encounter (Signed)
Presenting rhythm: AsBp ~88bpm. Stable device function, normal lead measurements. No episodes recorded. 98%Bp. Corvue back at baseline. Howie Ill, RN aware.

## 2017-01-04 ENCOUNTER — Ambulatory Visit (HOSPITAL_COMMUNITY)
Admission: RE | Admit: 2017-01-04 | Discharge: 2017-01-04 | Disposition: A | Discharge status: Home / Self Care | Payer: BLUE CROSS/BLUE SHIELD | Source: Ambulatory Visit | Attending: Internal Medicine | Admitting: Internal Medicine

## 2017-01-04 ENCOUNTER — Telehealth (HOSPITAL_COMMUNITY): Payer: Self-pay

## 2017-01-04 VITALS — BP 124/88 | HR 89 | Wt 308.0 lb

## 2017-01-04 DIAGNOSIS — I5022 Chronic systolic (congestive) heart failure: Secondary | ICD-10-CM

## 2017-01-04 DIAGNOSIS — I4891 Unspecified atrial fibrillation: Secondary | ICD-10-CM | POA: Diagnosis not present

## 2017-01-04 DIAGNOSIS — G4733 Obstructive sleep apnea (adult) (pediatric): Secondary | ICD-10-CM | POA: Insufficient documentation

## 2017-01-04 DIAGNOSIS — Z79899 Other long term (current) drug therapy: Secondary | ICD-10-CM | POA: Diagnosis not present

## 2017-01-04 DIAGNOSIS — I509 Heart failure, unspecified: Secondary | ICD-10-CM | POA: Insufficient documentation

## 2017-01-04 DIAGNOSIS — E876 Hypokalemia: Secondary | ICD-10-CM | POA: Insufficient documentation

## 2017-01-04 DIAGNOSIS — I110 Hypertensive heart disease with heart failure: Secondary | ICD-10-CM | POA: Diagnosis not present

## 2017-01-04 DIAGNOSIS — I429 Cardiomyopathy, unspecified: Secondary | ICD-10-CM | POA: Insufficient documentation

## 2017-01-04 DIAGNOSIS — Z6837 Body mass index (BMI) 37.0-37.9, adult: Secondary | ICD-10-CM | POA: Diagnosis not present

## 2017-01-04 LAB — BASIC METABOLIC PANEL
ANION GAP: 9 (ref 5–15)
BUN: 17 mg/dL (ref 6–20)
CHLORIDE: 102 mmol/L (ref 101–111)
CO2: 24 mmol/L (ref 22–32)
Calcium: 8.9 mg/dL (ref 8.9–10.3)
Creatinine, Ser: 0.81 mg/dL (ref 0.61–1.24)
GFR calc non Af Amer: >60 mL/min (ref >60)
Glucose, Bld: 181 mg/dL — ABNORMAL HIGH (ref 65–99)
POTASSIUM: 4.3 mmol/L (ref 3.5–5.1)
Sodium: 135 mmol/L (ref 135–145)

## 2017-01-04 MED ORDER — SACUBITRIL-VALSARTAN 49-51 MG PO TABS: 1.0000 | ORAL_TABLET | Freq: Two times a day (BID) | ORAL | 11 refills | Status: DC

## 2017-01-04 NOTE — Patient Instructions (Signed)
It was great to see you today!  Please INCREASE your Entresto to 49-51 mg TWICE DAILY.   Blood work today. We will call you with any changes.   You have an appointment with the pharmacist, Doroteo Bradford, again on 01/20/17 at 11:00 AM.

## 2017-01-04 NOTE — Progress Notes (Signed)
HF MD: [name]  HPI:  Aaron Thompson is a 56 y.o. Caucasian male with history of PAfib, NICM, HTN, morbid obesity, OSA, reports unable to tolerate CPAP, DM, LBBB   Hospital stay from  07/29/15 he was seen in the ER with palpitations/SOB that woke him, he was found to be in AFib with RVR and had DCCV in the ER.  No changes were made to f/u with sleep study and apnea tx.  He was unfortunately hospitalized again with RAFib 08/12/15 and had DCCV in ED with ERAF the same day and returned to the ED and was admitted, he was observed to have bothe AFib and ATach.   his coreg further up-titrated and ranolazine added.[name]  His K+ was increased with hypokalemia again noted.  Subsequently seen by JA for afib ablation that was done on 10/14/15. Ranexa stopped and plans were to stop tikosyn soon. Had recurrent AICD shocks due to rapid afib and placed back on tikosyn and anticoagulation Seen by Dr Caryl Comes 12/14/16 more tachycardic and dyspnea Thyroid BNP and Hct normal 12/14/16 RX lasix for 3 days   Echo 12/14/16 EF 20-25%  Presents today for pharmacist-led HF medication titration. At last clinic visit on 12/28/16, losartan was switched to Entresto 24-26 mg BID. Patient states he is doing well. He did state that on the 2nd day of taking Entresto he felt SOB and dizzy but this subsided on the third day and he has felt well since. He is actively trying to lose weight by eating less carbohydrates and fasting intermittently. He has already lost 6 lb in 2 weeks.      . Shortness of breath/dyspnea on exertion? Yes - only upon exertion  . Orthopnea/PND? no . Edema? no . Lightheadedness/dizziness? no . Daily weights at home? No - weighs once weekly >> lost 6 lbs in 2 weeks, down to 294 lb at home . Blood pressure/heart rate monitoring at home? no . Following low-sodium/fluid-restricted diet? Yes - lower sodium, lower carb, intermittent fasting   HF Medications: Carvedilol 50 mg PO BID KCl 40 mEq PO daily Entresto 24-26 mg  PO BID Spironolactone 25 mg PO daily   Has the patient been experiencing any side effects to the medications prescribed?  no  Does the patient have any problems obtaining medications due to transportation or finances?   No - [employer] commercial and has 30 day free card and $10 copay card  Understanding of regimen: good Understanding of indications: good Potential of compliance: good Patient understands to avoid NSAIDs. Patient understands to avoid decongestants.    Pertinent Lab Values: . 01/04/17: Serum creatinine 0.81, BUN 17, Potassium 4.3, Sodium 135  Vital Signs: . Weight: 308 lb (dry weight: 300-305 lb) . Blood pressure: 124/88 mmHg  . Heart rate: 89 bpm    Assessment: 1. Chronicsystolic CHF (EF 45-40%), due to NICM. NYHA class IIsymptoms.  - Volume status stable   - Increase Entresto to 49-51 mg BID  - Continue carvedilol 50 mg BID, KCl 40 meq daily, and spironolactone 25 mg daily - Basic disease state pathophysiology, medication indication, mechanism and side effects reviewed at length with patient and he verbalized understanding  Plan: 1) Medication changes: Based on clinical presentation, vital signs and recent labs will increase Entresto to 49-51 mg BID 2) Labs: BMET today  3) Follow-up: Pharmacy clinic in 2 weeks to titrate [facility] and back with Dr. [name] on 03/28/17   Ruta Hinds. Velva Harman, PharmD, BCPS, CPP Clinical Pharmacist Pager: [phone] Phone: [phone] 01/04/2017 11:06  AM  Agree with above.  Glori Bickers, MD  3:42 PM

## 2017-01-04 NOTE — Telephone Encounter (Signed)
CHF Clinic appointment reminder call placed to patient for upcoming pharmacy appt.  LVMTCB to confirm appt.  Patient also reminded to take all medications as prescribed on the day of his/her appointment and to bring all medications to this appointment.  Advised to call our office for tardiness or cancellations/rescheduling needs.  Leory Plowman, Guinevere Ferrari

## 2017-01-05 ENCOUNTER — Other Ambulatory Visit: Payer: Self-pay | Admitting: Nurse Practitioner

## 2017-01-18 ENCOUNTER — Telehealth: Payer: Self-pay | Admitting: Cardiovascular Disease

## 2017-01-20 ENCOUNTER — Ambulatory Visit: Payer: BLUE CROSS/BLUE SHIELD | Admitting: Internal Medicine

## 2017-01-20 ENCOUNTER — Ambulatory Visit (HOSPITAL_COMMUNITY)
Admission: RE | Admit: 2017-01-20 | Discharge: 2017-01-20 | Disposition: A | Discharge status: Home / Self Care | Payer: BLUE CROSS/BLUE SHIELD | Source: Ambulatory Visit | Attending: Internal Medicine | Admitting: Internal Medicine

## 2017-01-20 ENCOUNTER — Telehealth: Payer: Self-pay | Admitting: Cardiovascular Disease

## 2017-01-20 VITALS — BP 124/90 | HR 80 | Wt 296.8 lb

## 2017-01-20 VITALS — BP 126/84 | HR 84 | Ht 76.0 in | Wt 298.0 lb

## 2017-01-20 DIAGNOSIS — I428 Other cardiomyopathies: Secondary | ICD-10-CM

## 2017-01-20 DIAGNOSIS — G4733 Obstructive sleep apnea (adult) (pediatric): Secondary | ICD-10-CM | POA: Diagnosis not present

## 2017-01-20 DIAGNOSIS — I447 Left bundle-branch block, unspecified: Secondary | ICD-10-CM | POA: Insufficient documentation

## 2017-01-20 DIAGNOSIS — E119 Type 2 diabetes mellitus without complications: Secondary | ICD-10-CM | POA: Diagnosis not present

## 2017-01-20 DIAGNOSIS — I5022 Chronic systolic (congestive) heart failure: Secondary | ICD-10-CM

## 2017-01-20 DIAGNOSIS — I480 Paroxysmal atrial fibrillation: Secondary | ICD-10-CM

## 2017-01-20 DIAGNOSIS — I110 Hypertensive heart disease with heart failure: Secondary | ICD-10-CM | POA: Diagnosis present

## 2017-01-20 DIAGNOSIS — Z4502 Encounter for adjustment and management of automatic implantable cardiac defibrillator: Secondary | ICD-10-CM

## 2017-01-20 LAB — CUP PACEART INCLINIC DEVICE CHECK
HighPow Impedance: 92.25 Ohm
Implantable Lead Implant Date: 20150121
Implantable Lead Implant Date: 20150121
Implantable Lead Location: 753858
Implantable Pulse Generator Implant Date: 20150121
Lead Channel Impedance Value: 450 Ohm
Lead Channel Impedance Value: 462.5 Ohm
Lead Channel Impedance Value: 975 Ohm
Lead Channel Pacing Threshold Amplitude: 2.875 V
Lead Channel Pacing Threshold Pulse Width: 0.5 ms
Lead Channel Pacing Threshold Pulse Width: 0.5 ms
Lead Channel Sensing Intrinsic Amplitude: 5 mV
Lead Channel Setting Pacing Amplitude: 1.75 V
Lead Channel Setting Pacing Amplitude: 3.5 V
Lead Channel Setting Pacing Pulse Width: 0.8 ms
MDC IDC LEAD IMPLANT DT: 20150121
MDC IDC LEAD LOCATION: 753859
MDC IDC LEAD LOCATION: 753860
MDC IDC MSMT BATTERY REMAINING LONGEVITY: 20 mo
MDC IDC MSMT LEADCHNL RA PACING THRESHOLD AMPLITUDE: 0.75 V
MDC IDC MSMT LEADCHNL RA PACING THRESHOLD PULSEWIDTH: 0.5 ms
MDC IDC MSMT LEADCHNL RV PACING THRESHOLD AMPLITUDE: 1 V
MDC IDC MSMT LEADCHNL RV SENSING INTR AMPL: 12 mV
MDC IDC SESS DTM: 20181227143627
MDC IDC SET LEADCHNL RV PACING AMPLITUDE: 2 V
MDC IDC SET LEADCHNL RV PACING PULSEWIDTH: 0.5 ms
MDC IDC SET LEADCHNL RV SENSING SENSITIVITY: 0.5 mV
MDC IDC STAT BRADY RA PERCENT PACED: 0.25 %
MDC IDC STAT BRADY RV PERCENT PACED: 98 %
Pulse Gen Serial Number: 7133975

## 2017-01-20 LAB — BASIC METABOLIC PANEL
ANION GAP: 10 (ref 5–15)
BUN: 15 mg/dL (ref 6–20)
CALCIUM: 9.1 mg/dL (ref 8.9–10.3)
CO2: 27 mmol/L (ref 22–32)
Chloride: 100 mmol/L — ABNORMAL LOW (ref 101–111)
Creatinine, Ser: 0.76 mg/dL (ref 0.61–1.24)
GFR calc Af Amer: >60 mL/min (ref >60)
GLUCOSE: 253 mg/dL — AB (ref 65–99)
Potassium: 4.7 mmol/L (ref 3.5–5.1)
SODIUM: 137 mmol/L (ref 135–145)

## 2017-01-20 MED ORDER — SACUBITRIL-VALSARTAN 97-103 MG PO TABS: 1.0000 | ORAL_TABLET | Freq: Two times a day (BID) | ORAL | 11 refills | Status: DC

## 2017-01-20 NOTE — Telephone Encounter (Signed)
Ok to refill 

## 2017-01-20 NOTE — Progress Notes (Signed)
HF MD: Aaron Thompson  HPI:  Shaquel Chavous Overmanis a 56 y.o.Caucasian malewith history of PAfib, NICM, HTN, morbid obesity, OSA, reports unable to tolerate CPAP, DM, LBBB Hospital stay from 07/29/15 he was seen in the ER with palpitations/SOB that woke him, he was found to be in AFib with RVR and had DCCV in the ER. No changes were made to f/u with sleep study and apnea tx. He was unfortunately hospitalized again with RAFib 08/12/15 and had DCCV in ED with ERAF the same day and returned to the ED and was admitted, he was observed to have bothe AFib and ATach. his coreg further up-titrated and ranolazine added.[name] His K+ was increased with hypokalemia again noted.  Subsequently seen by JA for afib ablation that was done on 10/14/15. Ranexa stopped and plans were to stop tikosyn soon.Had recurrent AICD shocks due to rapid afib and placed back on tikosyn and anticoagulation Seen by Dr Caryl Comes 12/14/16 more tachycardic and dyspnea Thyroid BNP and Hct normal 12/14/16 RX lasix for 3 days   Echo 12/14/16 EF 20-25%  Presents today for pharmacist-led HF medication titration. At last pharmacist-led clinic visit on 01/04/17, [name] was increased to 49-51 mg BID. Patient states he is doing well and feels much better symptomatically since increasing Entresto. He continues to actively lose weight by eating less carbohydrates and fasting intermittently. He has already lost 16 lb in 1 month.       . Shortness of breath/dyspnea on exertion? No . Orthopnea/PND? no . Edema? no . Lightheadedness/dizziness? no . Daily weights at home? No - once weekly continues to attempt to lose weight  . Blood pressure/heart rate monitoring at home? no . Following low-sodium/fluid-restricted diet? yes  HF Medications: Carvedilol 50 mg PO BID KCl 40 mEq PO daily Entresto 49-51 mg PO BID Spironolactone 25 mg PO daily   Has the patient been experiencing any side effects to the medications prescribed?  no  Does the patient have  any problems obtaining medications due to transportation or finances?   No - [employer] commercial and has 30 day free card and $10 copay card  Understanding of regimen: good Understanding of indications: good Potential of compliance: good Patient understands to avoid NSAIDs. Patient understands to avoid decongestants.    Pertinent Lab Values: . 01/20/17: Serum creatinine 0.76, BUN 15, Potassium 4.7, Sodium 137  Vital Signs: . Weight: 296 lb (last HF clinic weight: 308 lb) . Blood pressure: 124/90 mmHg  . Heart rate: 80 bpm    Assessment: 1. Chronicsystolic CHF (EF 16-10%), due to NICM. NYHA class IIsymptoms.  - Volume status stable   - Increase Entresto to goal 97-103 mg BID  - Continue carvedilol 50 mg BID, KCl 40 meq daily, and spironolactone 25 mg daily - Basic disease state pathophysiology, medication indication, mechanism and side effects reviewed at length with patient and he verbalized understanding    Plan: 1) Medication changes: Based on clinical presentation, vital signs and recent labs will increase Entresto to 97-103 mg BID 2) Labs: BMET today and in 2 weeks 3) Follow-up: Dr. Johnsie Thompson on 03/28/17   Ruta Hinds. Velva Harman, PharmD, BCPS, CPP Clinical Pharmacist Pager: [phone] Phone: [phone] 01/20/2017 10:51 AM

## 2017-01-20 NOTE — Telephone Encounter (Signed)
Pt is calling requesting a refill on tramadol 50 mg tablet, second time requesting medication. Would you like to refill this medication? Please advise

## 2017-01-20 NOTE — Progress Notes (Signed)
Patient Care Team: Tower, Wynelle Fanny, MD as PCP - General All  HPI  Aaron Thompson is a 56 y.o. male Seen in followup for CRT- ICD implanted for nonischemic cardiomyopathy and left bundle branch block.  He was noted on  his device to have atrial fibrillation and he was started on Rivaroxaban for stroke reduction prophylaxis   Chose to stop his anticoagulation and to return to work. March 2016 he had ICD shocks associated with very rapid atrial fibrillation.  He is now on dofetilide therapy as well as back on Rivaroxaban   He underwent catheter ablation of his atrial fibrillation (JA) 9/17   Complaints of sob and DOE   With mild edema  NO palpitations or syncope or chest pain   DATE TEST    9/17 Echo   EF 40 %   11/18  Echo  EF 20-25%            Past Medical History:  Diagnosis Date  . AICD (automatic cardioverter/defibrillator) present   . Anxiety state, unspecified   . Calculus of kidney 1981   "passed it on my own"  . Chronic systolic CHF (congestive heart failure) ([place])   . Dermatophytosis of the body   . DJD (degenerative joint disease)   . Esophageal reflux   . Hx of colonic polyps   . Hypertension   . LBBB (left bundle branch block)    intermittent  . Non-ischemic cardiomyopathy ([place])    a. cath 2013: minor nonobstructive CAD.  . OSA on CPAP    pt can't use it.  . Osteoarthrosis, unspecified whether generalized or localized, hand   . Other chronic nonalcoholic liver disease    fatty liver  . PAF (paroxysmal atrial fibrillation) ([place])    a. s/p DCCV 6/11; previously on Pradaxa;  b. Event Monitor 2012->No PAF;  c. 09/2011 s/p DCCV ->Xarelto started. d. 03/2014: inappropriate ICD shocks for AF-RVR, started on Tikosyn, Xarelto restarted.  . Type II diabetes mellitus ([place])   . Unspecified hearing loss    no hearing aid    Past Surgical History:  Procedure Laterality Date  . Abdominal US  01/2007   Fatty liver, no gallstones  . APPENDECTOMY    . ATRIAL  TACH ABLATION  09/2015  . BI-VENTRICULAR IMPLANTABLE CARDIOVERTER DEFIBRILLATOR [name] 02/14/2013   STJ CRTD implanted by Dr Caryl Comes  . BILATERAL KNEE ARTHROSCOPY    . CARDIOVERSION  09/29/2011   Procedure: CARDIOVERSION;  Surgeon: Thayer Headings, MD;  Location: [place];  Service: Cardiovascular;  Laterality: [name];  . COLONOSCOPY WITH PROPOFOL [name] 10/26/2016   Procedure: COLONOSCOPY WITH PROPOFOL;  Surgeon: Irene Shipper, MD;  Location: [facility] ENDOSCOPY;  Service: Endoscopy;  Laterality: [name];  . DOPPLER ECHOCARDIOGRAPHY  11/2009   Decreased EF of 35-40%  . ELECTROPHYSIOLOGIC STUDY [name] 10/14/2015   Procedure: Atrial Fibrillation Ablation;  Surgeon: [name], MD;  Location: [place] CV LAB;  Service: Cardiovascular;  Laterality: [name];  . LEFT HEART CATHETERIZATION WITH CORONARY ANGIOGRAM [name] 08/18/2011   Procedure: LEFT HEART CATHETERIZATION WITH CORONARY ANGIOGRAM;  Surgeon: Peter M Martinique, MD;  Location: [facility] CATH LAB;  Service: Cardiovascular;  Laterality: [name];  . NECK SURGERY     plate/fusion  . Stress Cardiolite  08/2005   Normal, EF 42%  . UPPER GASTROINTESTINAL ENDOSCOPY  08/2006   GERD    Current Outpatient Medications  Medication Sig Dispense Refill  . carvedilol (COREG) 25 MG tablet Take 2 tablets (50 mg  total) by mouth 2 (two) times daily with a meal. 360 tablet 3  . cetirizine (ZYRTEC) 10 MG tablet Take 10 mg by mouth daily as needed for allergies.    [name] dofetilide (TIKOSYN) 500 MCG capsule TAKE 1 CAPSULE(500 MCG) BY MOUTH TWICE DAILY 180 capsule 3  . glipiZIDE (GLUCOTROL XL) 5 MG 24 hr tablet TAKE 1 TABLET(5 MG) BY MOUTH DAILY WITH BREAKFAST 30 tablet 11  . glucose blood (ONE TOUCH ULTRA TEST) test strip CHECK GLUCOSE TWICE DAILY AND AS NEEDED, DX. E11.65 (UNCONTROLLED DM) 100 each 5  . Glycerin-Hypromellose-PEG 400 (VISINE TIRED EYE RELIEF OP) Place 1-2 drops into both eyes 3 (three) times daily as needed (for dry/irritated eye).    . metFORMIN (GLUCOPHAGE XR) 500 MG 24 hr tablet Take 3  tablets (1,500 mg total) by mouth daily with breakfast. 90 tablet 11  . Multiple Vitamin (MULTIVITAMIN) tablet Take 1 tablet by mouth daily.      [name] Bicarbonate (ZEGERID OTC) 20-1100 MG CAPS Take 1 capsule by mouth 2 (two) times daily.     . Potassium Chloride ER 20 MEQ TBCR Take 40 mEq by mouth daily.  3  . sacubitril-valsartan (ENTRESTO) 49-51 MG Take 1 tablet by mouth 2 (two) times daily. 60 tablet 11  . spironolactone (ALDACTONE) 25 MG tablet TAKE 1 TABLET(25 MG) BY MOUTH DAILY 30 tablet 7  . traMADol (ULTRAM) 50 MG tablet TAKE 1 TABLET BY MOUTH TWICE DAILY 180 tablet 3  . XARELTO 20 MG TABS tablet TAKE 1 TABLET(20 MG) BY MOUTH DAILY WITH SUPPER 30 tablet 5   No current facility-administered medications for this visit.     No Known Allergies  Review of Systems negative except from HPI and PMH  Physical Exam BP 126/84   Pulse 84   Ht 6\' 4"  (1.93 m)   Wt 298 lb (135.2 kg)   BMI 36.27 kg/m  Well developed and nourished in no acute distress HENT normal Neck supple with JVP-flat Clear Regular rate and rhythm, no murmurs or gallops Abd-soft with active BS No Clubbing cyanosis edema Skin-warm and dry A & Oriented  Grossly normal sensory and motor function   ECG demonstrates sinus rhythm at 84 With P synchronous pacing.  QRS is RS in lead I and QS in leads V1  Assessment and  Plan  Nonischemic cardiomyopathy  Congestive heart failure-acute chronic-systolic  Hypertension  Implantable defibrillator-CRT   Sleep disorder breathing  Atrial fibrillation  Inappropriate ICD discharges    Blood pressure is reasonably controlled.  The mechanism of his dyspnea is not clear.  I certainly would worry about deteriorating LV function.  Resynchronization appears to be reasonable based on the ECG.  We will repeat an echocardiogram and check a BNP.  There is mild volume overload.  We will give him furosemide to take 20 mg 3 days in a row.  With his tachycardia  evidence on his ECG, I compared histograms over the last year.  They are not particularly right shifted apart from about 1%.  However, the tachycardia today may be relatively new.  We will check a TSH as well as a CBC.

## 2017-01-20 NOTE — Patient Instructions (Addendum)
It was great to see you today!  Please INCREASE your Entresto 97-103 mg TWICE DAILY.   Blood work today. We will call you with any changes.   You are scheduled for blood work again on 02/03/17 at 9:00 am.  Please keep your appointment with Dr. [name] on 03/28/17.

## 2017-01-20 NOTE — Telephone Encounter (Signed)
Pt's pharmacy is requesting a refill on Tramadol 50 mg tablet. Would you like to refill this medication? Please advise

## 2017-01-20 NOTE — Patient Instructions (Addendum)
Medication Instructions: Your physician recommends that you continue on your current medications as directed. Please refer to the Current Medication list given to you today.  Labwork: None Ordered  Procedures/Testing: None Ordered  Follow-Up: Your physician wants you to follow-up in: 1 YEAR with [name], NP. You will receive a reminder letter in the mail two months in advance. If you don't receive a letter, please call our office to schedule the follow-up appointment.  Remote monitoring is used to monitor your ICD from home. This monitoring reduces the number of office visits required to check your device to one time per year. It allows [place] to keep an eye on the functioning of your device to ensure it is working properly. You are scheduled for a device check from home on 03/15/17. You may send your transmission at any time that day. If you have a wireless device, the transmission will be sent automatically. After your physician reviews your transmission, you will receive a postcard with your next transmission date.   If you need a refill on your cardiac medications before your next appointment, please call your pharmacy.

## 2017-01-21 ENCOUNTER — Telehealth: Payer: Self-pay | Admitting: Cardiovascular Disease

## 2017-01-21 MED ORDER — TRAMADOL HCL 50 MG PO TABS
50.0000 mg | ORAL_TABLET | Freq: Two times a day (BID) | ORAL | 1 refills | Status: DC
Start: 2017-01-21 — End: 2017-06-06

## 2017-01-21 NOTE — Telephone Encounter (Signed)
New Message  Pt call requesting to speak with RN about his social security disability. Please call back to discuss

## 2017-01-21 NOTE — Addendum Note (Signed)
Addended by: Derl Barrow on: 01/21/2017 09:01 AM   Modules accepted: Orders

## 2017-01-21 NOTE — Telephone Encounter (Signed)
Dr. [name] has already address this matter. Pt's medication has been refilled. Medication called into pt's pharmacy.

## 2017-01-21 NOTE — Telephone Encounter (Signed)
Per Dr. [name] and Theda Sers, RN, doctor Nishan's nurse to refill pt's Tramadol 50 mg tablet with 1 refill. I called this medication into [facility] giving a verbal order over the phone dispensing 60 tablets with 1 refill. [facility] pharmacy verbalized understanding.

## 2017-01-24 NOTE — Telephone Encounter (Signed)
Called patient back he stated Dr.Klein & Sherri agreed to complete Disability paperwork for him. I made patient aware we usually have a copy service that completes this and they charge a $25.00 fee. I asked patient If he could bring paperwork to me so I could look over it and check with Sherri on her next scheduled day in the office to see if this is something Dr.Klein agreed on completing. Patient was agreeable with this and stated he will drop off paperwork on Wednesday.

## 2017-01-27 ENCOUNTER — Telehealth: Payer: Self-pay | Admitting: Cardiovascular Disease

## 2017-01-27 NOTE — Telephone Encounter (Signed)
Routing to Dr. Kyla Balzarine nurse to address w/ [name] next week. She will forward to Dr. Olin Pia assist if some needs to be completed by Caryl Comes also.

## 2017-01-27 NOTE — Telephone Encounter (Signed)
Walk in pt Form-Residual Functional Capacity Form Dropped off placed in [place] Doc box.

## 2017-02-03 ENCOUNTER — Telehealth: Payer: Self-pay | Admitting: Cardiovascular Disease

## 2017-02-03 ENCOUNTER — Ambulatory Visit (HOSPITAL_COMMUNITY)
Admission: RE | Admit: 2017-02-03 | Discharge: 2017-02-03 | Disposition: A | Discharge status: Home / Self Care | Payer: BLUE CROSS/BLUE SHIELD | Source: Ambulatory Visit | Attending: Internal Medicine | Admitting: Internal Medicine

## 2017-02-03 DIAGNOSIS — I5022 Chronic systolic (congestive) heart failure: Secondary | ICD-10-CM | POA: Diagnosis not present

## 2017-02-03 LAB — BASIC METABOLIC PANEL
Anion gap: 9 (ref 5–15)
BUN: 14 mg/dL (ref 6–20)
CALCIUM: 8.9 mg/dL (ref 8.9–10.3)
CHLORIDE: 103 mmol/L (ref 101–111)
CO2: 27 mmol/L (ref 22–32)
CREATININE: 0.77 mg/dL (ref 0.61–1.24)
GFR calc Af Amer: >60 mL/min (ref >60)
GFR calc non Af Amer: >60 mL/min (ref >60)
GLUCOSE: 241 mg/dL — AB (ref 65–99)
Potassium: 4.4 mmol/L (ref 3.5–5.1)
Sodium: 139 mmol/L (ref 135–145)

## 2017-02-03 NOTE — Telephone Encounter (Signed)
Called patient he aware Residual Functional Capacity Form ready for pick up.

## 2017-02-08 NOTE — Telephone Encounter (Signed)
Dr. [name] completed form.

## 2017-03-13 ENCOUNTER — Other Ambulatory Visit: Payer: Self-pay | Admitting: Cardiovascular Disease

## 2017-03-15 ENCOUNTER — Telehealth: Payer: Self-pay | Admitting: Cardiology

## 2017-03-15 ENCOUNTER — Ambulatory Visit (INDEPENDENT_AMBULATORY_CARE_PROVIDER_SITE_OTHER): Payer: BLUE CROSS/BLUE SHIELD | Admitting: *Deleted

## 2017-03-15 DIAGNOSIS — I428 Other cardiomyopathies: Secondary | ICD-10-CM

## 2017-03-15 NOTE — Telephone Encounter (Signed)
Spoke with pt and reminded pt of remote transmission that is due today. Pt verbalized understanding.   

## 2017-03-16 LAB — CUP PACEART REMOTE DEVICE CHECK
Battery Remaining Percentage: 28 %
Brady Statistic AP VP Percent: 1 %
Brady Statistic AP VS Percent: 1 %
Brady Statistic AS VP Percent: 99 %
Brady Statistic AS VS Percent: 1 %
Brady Statistic RA Percent Paced: 1 %
Date Time Interrogation Session: 20190219201519
HighPow Impedance: 95 Ohm
HighPow Impedance: 95 Ohm
Implantable Lead Implant Date: 20150121
Implantable Lead Location: 753858
Implantable Lead Location: 753860
Lead Channel Impedance Value: 490 Ohm
Lead Channel Impedance Value: 530 Ohm
Lead Channel Pacing Threshold Amplitude: 0.625 V
Lead Channel Pacing Threshold Pulse Width: 0.5 ms
Lead Channel Pacing Threshold Pulse Width: 0.8 ms
Lead Channel Sensing Intrinsic Amplitude: 5 mV
Lead Channel Setting Pacing Pulse Width: 0.8 ms
Lead Channel Setting Sensing Sensitivity: 0.5 mV
MDC IDC LEAD IMPLANT DT: 20150121
MDC IDC LEAD IMPLANT DT: 20150121
MDC IDC LEAD LOCATION: 753859
MDC IDC MSMT BATTERY REMAINING LONGEVITY: 19 mo
MDC IDC MSMT BATTERY VOLTAGE: 2.9 V
MDC IDC MSMT LEADCHNL LV IMPEDANCE VALUE: 960 Ohm
MDC IDC MSMT LEADCHNL LV PACING THRESHOLD AMPLITUDE: 2 V
MDC IDC MSMT LEADCHNL RA PACING THRESHOLD PULSEWIDTH: 0.5 ms
MDC IDC MSMT LEADCHNL RV PACING THRESHOLD AMPLITUDE: 1 V
MDC IDC MSMT LEADCHNL RV SENSING INTR AMPL: 6.8 mV
MDC IDC PG IMPLANT DT: 20150121
MDC IDC SET LEADCHNL LV PACING AMPLITUDE: 3.5 V
MDC IDC SET LEADCHNL RA PACING AMPLITUDE: 1.625
MDC IDC SET LEADCHNL RV PACING AMPLITUDE: 2 V
MDC IDC SET LEADCHNL RV PACING PULSEWIDTH: 0.5 ms
Pulse Gen Serial Number: 7133975

## 2017-03-16 NOTE — Progress Notes (Signed)
Remote ICD transmission.   

## 2017-03-17 ENCOUNTER — Encounter: Payer: Self-pay | Admitting: Cardiology

## 2017-03-24 ENCOUNTER — Other Ambulatory Visit: Payer: Self-pay | Admitting: Cardiovascular Disease

## 2017-03-25 NOTE — Progress Notes (Signed)
HF MD: Aaron Thompson  HPI:  57 y.o. PAF, NICM, HTN, OSA not wearing CPAP Last PAF with [facility] in ER 08/12/15  Baseline ECG with LBBB Had f/u afib ablation with Dr Rayann Heman 10/14/15 Having some issues with AICD shocking for rapid afib and placed back On tikosyn. And xarelto Doing well Got his disability. Son Has mild autism going to [employer] likes trains   Reviewed last echo 12/14/16 EF 20-25%  12/28/16 Entresto started and up titrated to max dose    Seen by Dr Caryl Comes 01/20/17 for CRT-ICD Pertinent Lab Values: . 02/03/17 Serum creatinine 0..77 , BUN 14, Potassium 4.4, Sodium 139    BP 128/78   Pulse 92   Ht 6\' 4"  (1.93 m)   Wt 298 lb 12 oz (135.5 kg)   SpO2 97%   BMI 36.36 kg/m   Healthy:  appears stated age HEENT: normal Neck supple with no adenopathy JVP normal no bruits no thyromegaly Lungs clear with no wheezing and good diaphragmatic motion Heart:  S1/S2 no murmur, no rub, gallop or click PMI normal Abdomen: benighn, BS positve, no tenderness, no AAA no bruit.  No HSM or HJR Distal pulses intact with no bruits No edema Neuro non-focal Skin warm and dry No muscular weakness  Review of Systems  Cardiovascular: Positive for palpitations. Negative for chest pain and leg swelling.    Current Outpatient Medications:  .  carvedilol (COREG) 25 MG tablet, Take 2 tablets (50 mg total) by mouth 2 (two) times daily with a meal., Disp: 120 tablet, Rfl: 11 .  cetirizine (ZYRTEC) 10 MG tablet, Take 10 mg by mouth daily as needed for allergies., Disp: , Rfl:  .  dofetilide (TIKOSYN) 500 MCG capsule, TAKE 1 CAPSULE(500 MCG) BY MOUTH TWICE DAILY, Disp: 180 capsule, Rfl: 3 .  glipiZIDE (GLUCOTROL XL) 5 MG 24 hr tablet, TAKE 1 TABLET(5 MG) BY MOUTH DAILY WITH BREAKFAST, Disp: 30 tablet, Rfl: 11 .  glucose blood (ONE TOUCH ULTRA TEST) test strip, CHECK GLUCOSE TWICE DAILY AND AS NEEDED, DX. E11.65 (UNCONTROLLED DM), Disp: 100 each, Rfl: 5 .  metFORMIN (GLUCOPHAGE XR) 500 MG 24 hr tablet, Take 3 tablets  (1,500 mg total) by mouth daily with breakfast., Disp: 90 tablet, Rfl: 11 .  Multiple Vitamin (MULTIVITAMIN) tablet, Take 1 tablet by mouth daily.  , Disp: , Rfl:  .  Omeprazole-Sodium Bicarbonate (ZEGERID OTC) 20-1100 MG CAPS, Take 1 capsule by mouth 2 (two) times daily. , Disp: , Rfl:  .  Potassium Chloride ER 20 MEQ TBCR, Take 40 mEq by mouth daily., Disp: 60 tablet, Rfl: 11 .  sacubitril-valsartan (ENTRESTO) 97-103 MG, Take 1 tablet by mouth 2 (two) times daily., Disp: 60 tablet, Rfl: 11 .  spironolactone (ALDACTONE) 25 MG tablet, Take 1 tablet (25 mg total) by mouth daily., Disp: 30 tablet, Rfl: 11 .  traMADol (ULTRAM) 50 MG tablet, Take 1 tablet (50 mg total) by mouth 2 (two) times daily., Disp: 60 tablet, Rfl: 1 .  XARELTO 20 MG TABS tablet, TAKE 1 TABLET(20 MG) BY MOUTH DAILY WITH SUPPER, Disp: 30 tablet, Rfl: 5   Assessment: 1. Chronicsystolic CHF (EF 81-01%), due to NICM. NYHA class IIsymptoms. - stable weight And symptoms continue entresto, aldactone and coreg.  2. PAF:  Maintaining NSR on tikosyn continue anticoagulation 3. HTN:  Well controlled.  Continue current medications and low sodium Dash type diet.   4. AICD:  F/u Dr Caryl Comes normal function rate parameters changed for rapid PAF 5. OSA:  Compliance with CPAP  stressed especially regarding PAF

## 2017-03-25 NOTE — Telephone Encounter (Signed)
Please advise on refills. Request for potassium is not the same as current med list. Okay to refill tramadol? Thanks, MI

## 2017-03-28 ENCOUNTER — Ambulatory Visit: Payer: BLUE CROSS/BLUE SHIELD | Admitting: Cardiovascular Disease

## 2017-03-30 ENCOUNTER — Ambulatory Visit: Payer: BLUE CROSS/BLUE SHIELD | Admitting: Cardiovascular Disease

## 2017-03-30 ENCOUNTER — Encounter: Payer: Self-pay | Admitting: Cardiovascular Disease

## 2017-03-30 VITALS — BP 128/78 | HR 92 | Ht 76.0 in | Wt 298.8 lb

## 2017-03-30 DIAGNOSIS — I5022 Chronic systolic (congestive) heart failure: Secondary | ICD-10-CM | POA: Diagnosis not present

## 2017-03-30 MED ORDER — CARVEDILOL 25 MG PO TABS: 50.0000 mg | ORAL_TABLET | Freq: Two times a day (BID) | ORAL | 11 refills | Status: DC

## 2017-03-30 MED ORDER — POTASSIUM CHLORIDE ER 20 MEQ PO TBCR: 40.0000 meq | EXTENDED_RELEASE_TABLET | Freq: Every day | ORAL | 11 refills | Status: DC

## 2017-03-30 MED ORDER — SACUBITRIL-VALSARTAN 97-103 MG PO TABS: 1.0000 | ORAL_TABLET | Freq: Two times a day (BID) | ORAL | 11 refills | Status: DC

## 2017-03-30 MED ORDER — SPIRONOLACTONE 25 MG PO TABS: 25.0000 mg | ORAL_TABLET | Freq: Every day | ORAL | 11 refills | Status: DC

## 2017-03-30 NOTE — Patient Instructions (Addendum)
Medication Instructions:  Your physician recommends that you continue on your current medications as directed. Please refer to the Current Medication list given to you today.  Labwork: NONE  Testing/Procedures: NONE  Follow-Up: Your physician recommends that you schedule a follow-up appointment in: 3 months with Heart Failure Clinic.  Your physician wants you to follow-up in: 6 months with Dr. Nishan. You will receive a reminder letter in the mail two months in advance. If you don't receive a letter, please call our office to schedule the follow-up appointment.  If you need a refill on your cardiac medications before your next appointment, please call your pharmacy.   

## 2017-04-06 ENCOUNTER — Encounter (HOSPITAL_COMMUNITY): Payer: Self-pay | Admitting: Nurse Practitioner

## 2017-04-06 ENCOUNTER — Telehealth: Payer: Self-pay | Admitting: Cardiovascular Disease

## 2017-04-06 ENCOUNTER — Ambulatory Visit (HOSPITAL_COMMUNITY)
Admission: RE | Admit: 2017-04-06 | Discharge: 2017-04-06 | Disposition: A | Discharge status: Home / Self Care | Payer: BLUE CROSS/BLUE SHIELD | Source: Ambulatory Visit | Attending: Nurse Practitioner | Admitting: Nurse Practitioner

## 2017-04-06 VITALS — BP 112/76 | HR 81 | Ht 76.0 in | Wt 297.8 lb

## 2017-04-06 DIAGNOSIS — E119 Type 2 diabetes mellitus without complications: Secondary | ICD-10-CM | POA: Insufficient documentation

## 2017-04-06 DIAGNOSIS — H9190 Unspecified hearing loss, unspecified ear: Secondary | ICD-10-CM | POA: Insufficient documentation

## 2017-04-06 DIAGNOSIS — G4733 Obstructive sleep apnea (adult) (pediatric): Secondary | ICD-10-CM | POA: Diagnosis not present

## 2017-04-06 DIAGNOSIS — Z79899 Other long term (current) drug therapy: Secondary | ICD-10-CM | POA: Diagnosis not present

## 2017-04-06 DIAGNOSIS — Z7984 Long term (current) use of oral hypoglycemic drugs: Secondary | ICD-10-CM | POA: Diagnosis not present

## 2017-04-06 DIAGNOSIS — I110 Hypertensive heart disease with heart failure: Secondary | ICD-10-CM | POA: Insufficient documentation

## 2017-04-06 DIAGNOSIS — I428 Other cardiomyopathies: Secondary | ICD-10-CM | POA: Diagnosis not present

## 2017-04-06 DIAGNOSIS — Z7901 Long term (current) use of anticoagulants: Secondary | ICD-10-CM | POA: Diagnosis not present

## 2017-04-06 DIAGNOSIS — K219 Gastro-esophageal reflux disease without esophagitis: Secondary | ICD-10-CM | POA: Insufficient documentation

## 2017-04-06 DIAGNOSIS — I480 Paroxysmal atrial fibrillation: Secondary | ICD-10-CM | POA: Diagnosis not present

## 2017-04-06 DIAGNOSIS — Z95810 Presence of automatic (implantable) cardiac defibrillator: Secondary | ICD-10-CM | POA: Insufficient documentation

## 2017-04-06 DIAGNOSIS — I5022 Chronic systolic (congestive) heart failure: Secondary | ICD-10-CM | POA: Diagnosis not present

## 2017-04-06 LAB — BASIC METABOLIC PANEL
Anion gap: 9 (ref 5–15)
BUN: 21 mg/dL — ABNORMAL HIGH (ref 6–20)
CO2: 26 mmol/L (ref 22–32)
Calcium: 8.9 mg/dL (ref 8.9–10.3)
Chloride: 103 mmol/L (ref 101–111)
Creatinine, Ser: 0.78 mg/dL (ref 0.61–1.24)
GLUCOSE: 245 mg/dL — AB (ref 65–99)
POTASSIUM: 4.1 mmol/L (ref 3.5–5.1)
Sodium: 138 mmol/L (ref 135–145)

## 2017-04-06 LAB — MAGNESIUM: Magnesium: 2 mg/dL (ref 1.7–2.4)

## 2017-04-06 NOTE — Telephone Encounter (Signed)
Aaron Thompson is calling because he went into AFIB on yesterday , but around 3 am this morning he thinks it went back into rhythm . Please call

## 2017-04-06 NOTE — Telephone Encounter (Signed)
Patient called about having an episode of A. FIB for 8 hours last night. Patient stated he had chest pain at the time, but denies chest pain right now. Patient stated he took his tikosyn and coreg earlier last night. Patient thinks this helped. Made patient an appointment with A. FIB clinic for this morning. Patient has seen Roderic Palau NP in the past. Patient stated he also sent a transmission from his device. Will send a message to [facility] to see if anything showed on patient's transmission.

## 2017-04-06 NOTE — Progress Notes (Signed)
Primary Care Physician: Tower, Wynelle Fanny, MD Referring Physician: [facility]: Dr. 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 He was more active than usual yesterday and noted groups of fast heart beat last night around 5:30 pm to 6 pm. He went ahead and took his Pm pills about an hour early. It was off an on irregular until 10 pm. He went to bed and woke up at 3 am and it was regular. This am, he sent a paceart report. He did notice some chest discomfort with the episodes but has had this before with afib. Obtained a copy of paceart and reviewed with [name], NP and she confirmed that pt was having nonsustained episodes of afib with rvr, lasting seconds to minutes,(longest epidode 22 mins)at 10 pm,  not v tach. He feels back to normal now.Pt states that Dr. Rayann Heman has said he may need another ablation at some point if burden increases.  Today, he denies symptoms of palpitations, chest pain, shortness of breath, orthopnea, PND, lower extremity edema, dizziness, presyncope, syncope, or neurologic sequela. The patient is tolerating medications without difficulties and is otherwise without complaint today.   Past Medical History:  Diagnosis Date  . AICD (automatic cardioverter/defibrillator) present   . Anxiety state, unspecified   . Calculus of kidney 1981   "passed it on my own"  . Chronic systolic CHF (congestive heart failure) ([place])   . Dermatophytosis of the body   . DJD (degenerative joint disease)   . Esophageal reflux   . Hx of colonic polyps   . Hypertension   . LBBB (left bundle branch block)    intermittent  . Non-ischemic cardiomyopathy ([place])    a. cath 2013: minor nonobstructive CAD.  . OSA on CPAP    pt can't use it.  . Osteoarthrosis, unspecified whether generalized or localized, hand   . Other chronic  nonalcoholic liver disease    fatty liver  . PAF (paroxysmal atrial fibrillation) ([place])    a. s/p DCCV 6/11; previously on Pradaxa;  b. Event Monitor 2012->No PAF;  c. 09/2011 s/p DCCV ->Xarelto started. d. 03/2014: inappropriate ICD shocks for AF-RVR, started on Tikosyn, Xarelto restarted.  . Type II diabetes mellitus ([place])   . Unspecified hearing loss    no hearing aid   Past Surgical History:  Procedure Laterality Date  . Abdominal US  01/2007   Fatty liver, no gallstones  . APPENDECTOMY    . ATRIAL TACH ABLATION  09/2015  . BI-VENTRICULAR IMPLANTABLE CARDIOVERTER DEFIBRILLATOR [name] 02/14/2013   STJ CRTD implanted by Dr Caryl Comes  . BILATERAL KNEE ARTHROSCOPY    . CARDIOVERSION  09/29/2011   Procedure: CARDIOVERSION;  Surgeon: Thayer Headings, MD;  Location: [facility];  Service: Cardiovascular;  Laterality: [name];  . COLONOSCOPY WITH PROPOFOL [name] 10/26/2016   Procedure: COLONOSCOPY WITH PROPOFOL;  Surgeon: Irene Shipper, MD;  [facility];  Service: Endoscopy;  Laterality: [name];  . DOPPLER ECHOCARDIOGRAPHY  11/2009   Decreased EF of 35-40%  . ELECTROPHYSIOLOGIC STUDY [name] 10/14/2015   Procedure: Atrial Fibrillation Ablation;  Surgeon: [name], MD;  Location: [facility];  Service: Cardiovascular;  Laterality: [name];  . LEFT HEART CATHETERIZATION WITH CORONARY ANGIOGRAM [name] 08/18/2011   Procedure: LEFT HEART CATHETERIZATION WITH CORONARY ANGIOGRAM;  Surgeon:  Peter M Martinique, MD;  Location: [facility] CATH LAB;  Service: Cardiovascular;  Laterality: [name];  . NECK SURGERY     plate/fusion  . Stress Cardiolite  08/2005   Normal, EF 42%  . UPPER GASTROINTESTINAL ENDOSCOPY  08/2006   GERD    Current Outpatient Medications  Medication Sig Dispense Refill  . carvedilol (COREG) 25 MG tablet Take 2 tablets (50 mg total) by mouth 2 (two) times daily with a meal. 120 tablet 11  . cetirizine (ZYRTEC) 10 MG tablet Take 10 mg by mouth daily as needed for allergies.    [name] dofetilide (TIKOSYN) 500 MCG  capsule TAKE 1 CAPSULE(500 MCG) BY MOUTH TWICE DAILY 180 capsule 3  . glipiZIDE (GLUCOTROL XL) 5 MG 24 hr tablet TAKE 1 TABLET(5 MG) BY MOUTH DAILY WITH BREAKFAST 30 tablet 11  . glucose blood (ONE TOUCH ULTRA TEST) test strip CHECK GLUCOSE TWICE DAILY AND AS NEEDED, DX. E11.65 (UNCONTROLLED DM) 100 each 5  . metFORMIN (GLUCOPHAGE XR) 500 MG 24 hr tablet Take 3 tablets (1,500 mg total) by mouth daily with breakfast. 90 tablet 11  . Multiple Vitamin (MULTIVITAMIN) tablet Take 1 tablet by mouth daily.      [name] Bicarbonate (ZEGERID OTC) 20-1100 MG CAPS Take 1 capsule by mouth 2 (two) times daily.     . Potassium Chloride ER 20 MEQ TBCR Take 40 mEq by mouth daily. 60 tablet 11  . sacubitril-valsartan (ENTRESTO) 97-103 MG Take 1 tablet by mouth 2 (two) times daily. 60 tablet 11  . spironolactone (ALDACTONE) 25 MG tablet Take 1 tablet (25 mg total) by mouth daily. 30 tablet 11  . traMADol (ULTRAM) 50 MG tablet Take 1 tablet (50 mg total) by mouth 2 (two) times daily. 60 tablet 1  . XARELTO 20 MG TABS tablet TAKE 1 TABLET(20 MG) BY MOUTH DAILY WITH SUPPER 30 tablet 5   No current facility-administered medications for this encounter.     No Known Allergies  Social History   Socioeconomic History  . Marital status: Married    Spouse name: Not on file  . Number of children: 2  . Years of education: Not on file  . Highest education level: Not on file  Social Needs  . Financial resource strain: Not on file  . Food insecurity - worry: Not on file  . Food insecurity - inability: Not on file  . Transportation needs - medical: Not on file  . Transportation needs - non-medical: Not on file  Occupational History  . Occupation: Banker: UNEMPLOYED  Tobacco Use  . Smoking status: Never Smoker  . Smokeless tobacco: Former Systems developer    Types: Snuff  . Tobacco comment: 02/14/2013 "quit snuff in 2006"  Substance and Sexual Activity  . Alcohol use: No    Alcohol/week: 0.0 oz  .  Drug use: No  . Sexual activity: Yes  Other Topics Concern  . Not on file  Social History Narrative   Lives in [place] with wife.  Works @ [employer] in [employer].  Not routinely exercising.    Family History  Problem Relation Age of Onset  . Hypertension Mother   . Diabetes Mother   . Heart failure Mother        Died @ 75  . Hypertension Father   . Heart failure Father        Died @ 20  . Prostate cancer Father   . Diabetes Brother   . Kidney disease Brother   .  Diabetes Brother   . Prostate cancer Brother   . Diabetes Brother   . Other Sister   . Heart attack Paternal Grandfather   . Stroke Paternal Grandmother   . Colon cancer Paternal Grandmother   . Multiple myeloma Sister   . Leukemia Sister   . Colon polyps Sister   . Cirrhosis Maternal Uncle        alcohol related  . Esophageal cancer Neg Hx   . Stomach cancer Neg Hx   . Pancreatic cancer Neg Hx     ROS- All systems are reviewed and negative except as per the HPI above  Physical Exam: Vitals:   04/06/17 0958  BP: 112/76  Pulse: 81  Weight: 297 lb 12.8 oz (135.1 kg)  Height: _0  (1.93 m)   Wt Readings from Last 3 Encounters:  04/06/17 297 lb 12.8 oz (135.1 kg)  03/30/17 298 lb 12 oz (135.5 kg)  01/20/17 296 lb 12.8 oz (134.6 kg)    Labs: Lab Results  Component Value Date   NA 139 02/03/2017   K 4.4 02/03/2017   CL 103 02/03/2017   CO2 27 02/03/2017   GLUCOSE 241 (H) 02/03/2017   BUN 14 02/03/2017   CREATININE 0.77 02/03/2017   CALCIUM 8.9 02/03/2017   PHOS 3.3 09/28/2011   MG 2.0 06/14/2016   Lab Results  Component Value Date   INR 1.12 03/17/2015   Lab Results  Component Value Date   CHOL 160 06/16/2016   HDL 39.50 06/16/2016   LDLCALC 94 04/07/2015   TRIG 221.0 (H) 06/16/2016     GEN- The patient is well appearing, alert and oriented x 3 today.   Head- normocephalic, atraumatic Eyes-  Sclera clear, conjunctiva pink Ears- hearing intact Oropharynx- clear Neck-  supple, no JVP Lymph- no cervical lymphadenopathy Lungs- Clear to ausculation bilaterally, normal work of breathing Heart- Regular rate and rhythm, no murmurs, rubs or gallops, PMI not laterally displaced GI- soft, NT, ND, + BS Extremities- no clubbing, cyanosis, or edema MS- no significant deformity or atrophy Skin- no rash or lesion Psych- euthymic mood, full affect Neuro- strength and sensation are intact  EKG-a sensed/v paced at 81 bpm, pr int 168 ms, qrs int 96 ms, qtc 497 ms Paceart report reviewed with [name], had non sustained episodes of afib, with RVR, not v tach    Assessment and Plan: 1.Paroxysmal afib Episodes last pm but now back to usual rhythm, burden overall less than 1% If burden increase may need another ablation Continue dofetilide and carvedilol Continue xarelto 20 mg with chadsvasc score of at least 3 Bmet/mag today  2.CHF Stable  3. HTN Stable   Aaron Thompson, [facility] [address] [place], [place] [address] [phone]

## 2017-04-06 NOTE — Telephone Encounter (Signed)
Episodes of AF w/ RVR noted.

## 2017-04-13 ENCOUNTER — Other Ambulatory Visit: Payer: Self-pay | Admitting: Cardiovascular Disease

## 2017-04-13 NOTE — Telephone Encounter (Signed)
Medication Detail    Disp Refills Start End   spironolactone (ALDACTONE) 25 MG tablet 30 tablet 11 03/30/2017    Sig - Route: Take 1 tablet (25 mg total) by mouth daily. - Oral   Sent to pharmacy as: spironolactone (ALDACTONE) 25 MG tablet   E-Prescribing Status: Receipt confirmed by pharmacy (03/30/2017 9:01 AM EST)   Pharmacy   [id number] - [place], [place]

## 2017-05-24 ENCOUNTER — Telehealth: Payer: Self-pay | Admitting: Cardiovascular Disease

## 2017-05-24 NOTE — Telephone Encounter (Signed)
Spoke with pt informed him that I had reviewed his transmissions and did not see any recorded episodes of any kind. Pt stated that he would feel one intermittently here and there. Pt stated that he was feeling fine

## 2017-05-24 NOTE — Telephone Encounter (Signed)
New Message   Patient is requesting to speak with a nurse. He believes that he had a skip beat yesterday so he wants Dr. [name] to read his transmission  That he sent yesterday. Please call to discuss.

## 2017-06-06 ENCOUNTER — Other Ambulatory Visit: Payer: Self-pay | Admitting: Cardiovascular Disease

## 2017-06-06 ENCOUNTER — Telehealth: Payer: Self-pay

## 2017-06-06 ENCOUNTER — Telehealth: Payer: Self-pay | Admitting: Internal Medicine

## 2017-06-06 MED ORDER — TRAMADOL HCL 50 MG PO TABS: 50.0000 mg | ORAL_TABLET | Freq: Two times a day (BID) | ORAL | 5 refills | Status: DC

## 2017-06-06 NOTE — Telephone Encounter (Signed)
Ok to refill 

## 2017-06-06 NOTE — Telephone Encounter (Signed)
Called patient's refill in to his pharmacy of choice. Informed patient that refill has been called in. Patient verbalized understanding.

## 2017-06-06 NOTE — Telephone Encounter (Signed)
Pt is requesting a refill. Please address. Thank You.

## 2017-06-06 NOTE — Telephone Encounter (Signed)
°  1. Has your device fired? no ° °2. Is you device beeping? no ° °3. Are you experiencing draining or swelling at device site?no ° °4. Are you calling to see if we received your device transmission?yes ° °5. Have you passed out? no ° ° ° °Please route to Device Clinic Pool °

## 2017-06-06 NOTE — Telephone Encounter (Signed)
Patient is requesting a refill on Tramadol. Will forward to Dr. [name] for approval.

## 2017-06-06 NOTE — Telephone Encounter (Signed)
Informed patient that remote was received. Patient verbalized understanding.

## 2017-06-14 ENCOUNTER — Ambulatory Visit (INDEPENDENT_AMBULATORY_CARE_PROVIDER_SITE_OTHER): Payer: Medicare HMO | Admitting: *Deleted

## 2017-06-14 ENCOUNTER — Other Ambulatory Visit: Payer: Self-pay | Admitting: Family Medicine

## 2017-06-14 ENCOUNTER — Telehealth: Payer: Self-pay | Admitting: Cardiology

## 2017-06-14 DIAGNOSIS — I5022 Chronic systolic (congestive) heart failure: Secondary | ICD-10-CM | POA: Diagnosis not present

## 2017-06-14 DIAGNOSIS — I428 Other cardiomyopathies: Secondary | ICD-10-CM

## 2017-06-14 NOTE — Telephone Encounter (Signed)
Please schedule a f/u and refill until then  Ask him to bring blood glucose log if he has one  Thanks

## 2017-06-14 NOTE — Telephone Encounter (Signed)
Spoke with pt and reminded pt of remote transmission that is due today. Pt verbalized understanding.   

## 2017-06-14 NOTE — Progress Notes (Signed)
Remote ICD transmission.   

## 2017-06-14 NOTE — Telephone Encounter (Signed)
No recent or future f/u or recent and no recent a1c, please advise

## 2017-06-14 NOTE — Telephone Encounter (Signed)
appt scheduled and med filled  

## 2017-06-15 NOTE — Telephone Encounter (Signed)
I have sent the approval letter to be scanned into the pts chart

## 2017-06-16 LAB — CUP PACEART REMOTE DEVICE CHECK
Brady Statistic AP VS Percent: 1 %
Brady Statistic AS VP Percent: 97 %
Brady Statistic AS VS Percent: 1.1 %
Date Time Interrogation Session: 20190521191105
HighPow Impedance: 93 Ohm
HighPow Impedance: 93 Ohm
Implantable Lead Implant Date: 20150121
Implantable Lead Location: 753860
Implantable Lead Model: 7122
Lead Channel Impedance Value: 480 Ohm
Lead Channel Impedance Value: 950 Ohm
Lead Channel Pacing Threshold Amplitude: 0.75 V
Lead Channel Pacing Threshold Pulse Width: 0.5 ms
Lead Channel Sensing Intrinsic Amplitude: 5 mV
Lead Channel Setting Pacing Amplitude: 1.75 V
Lead Channel Setting Pacing Amplitude: 2 V
Lead Channel Setting Pacing Amplitude: 3.5 V
Lead Channel Setting Pacing Pulse Width: 0.5 ms
Lead Channel Setting Pacing Pulse Width: 0.8 ms
MDC IDC LEAD IMPLANT DT: 20150121
MDC IDC LEAD IMPLANT DT: 20150121
MDC IDC LEAD LOCATION: 753858
MDC IDC LEAD LOCATION: 753859
MDC IDC MSMT BATTERY REMAINING LONGEVITY: 18 mo
MDC IDC MSMT BATTERY REMAINING PERCENTAGE: 26 %
MDC IDC MSMT BATTERY VOLTAGE: 2.89 V
MDC IDC MSMT LEADCHNL LV PACING THRESHOLD AMPLITUDE: 2 V
MDC IDC MSMT LEADCHNL LV PACING THRESHOLD PULSEWIDTH: 0.8 ms
MDC IDC MSMT LEADCHNL RV IMPEDANCE VALUE: 540 Ohm
MDC IDC MSMT LEADCHNL RV PACING THRESHOLD AMPLITUDE: 1 V
MDC IDC MSMT LEADCHNL RV PACING THRESHOLD PULSEWIDTH: 0.5 ms
MDC IDC MSMT LEADCHNL RV SENSING INTR AMPL: 12 mV
MDC IDC PG IMPLANT DT: 20150121
MDC IDC PG SERIAL: 7133975
MDC IDC SET LEADCHNL RV SENSING SENSITIVITY: 0.5 mV
MDC IDC STAT BRADY AP VP PERCENT: 1 %
MDC IDC STAT BRADY RA PERCENT PACED: 1 %

## 2017-06-25 ENCOUNTER — Other Ambulatory Visit: Payer: Self-pay | Admitting: Family Medicine

## 2017-07-05 ENCOUNTER — Encounter: Payer: Self-pay | Admitting: Family Medicine

## 2017-07-05 ENCOUNTER — Ambulatory Visit (INDEPENDENT_AMBULATORY_CARE_PROVIDER_SITE_OTHER): Payer: Medicare HMO | Admitting: Family Medicine

## 2017-07-05 VITALS — BP 128/82 | HR 77 | Temp 97.7°F | Ht 76.0 in | Wt 299.0 lb

## 2017-07-05 DIAGNOSIS — E1165 Type 2 diabetes mellitus with hyperglycemia: Secondary | ICD-10-CM | POA: Diagnosis not present

## 2017-07-05 DIAGNOSIS — Z125 Encounter for screening for malignant neoplasm of prostate: Secondary | ICD-10-CM

## 2017-07-05 DIAGNOSIS — Z6837 Body mass index (BMI) 37.0-37.9, adult: Secondary | ICD-10-CM | POA: Diagnosis not present

## 2017-07-05 DIAGNOSIS — E7800 Pure hypercholesterolemia, unspecified: Secondary | ICD-10-CM

## 2017-07-05 DIAGNOSIS — I10 Essential (primary) hypertension: Secondary | ICD-10-CM | POA: Diagnosis not present

## 2017-07-05 DIAGNOSIS — I5022 Chronic systolic (congestive) heart failure: Secondary | ICD-10-CM | POA: Diagnosis not present

## 2017-07-05 LAB — LIPID PANEL
CHOL/HDL RATIO: 5
CHOLESTEROL: 165 mg/dL (ref 0–200)
HDL: 34.4 mg/dL — ABNORMAL LOW (ref >39.00)
LDL Cholesterol: 99 mg/dL (ref 0–99)
NonHDL: 130.56
TRIGLYCERIDES: 158 mg/dL — AB (ref 0.0–149.0)
VLDL: 31.6 mg/dL (ref 0.0–40.0)

## 2017-07-05 LAB — COMPREHENSIVE METABOLIC PANEL
ALBUMIN: 4.2 g/dL (ref 3.5–5.2)
ALT: 18 U/L (ref 0–53)
AST: 14 U/L (ref 0–37)
Alkaline Phosphatase: 54 U/L (ref 39–117)
BILIRUBIN TOTAL: 0.6 mg/dL (ref 0.2–1.2)
BUN: 19 mg/dL (ref 6–23)
CALCIUM: 9.4 mg/dL (ref 8.4–10.5)
CO2: 31 mEq/L (ref 19–32)
CREATININE: 0.83 mg/dL (ref 0.40–1.50)
Chloride: 100 mEq/L (ref 96–112)
GFR: 101.97 mL/min (ref >60.00)
Glucose, Bld: 242 mg/dL — ABNORMAL HIGH (ref 70–99)
Potassium: 5 mEq/L (ref 3.5–5.1)
Sodium: 139 mEq/L (ref 135–145)
TOTAL PROTEIN: 6.5 g/dL (ref 6.0–8.3)

## 2017-07-05 LAB — HEMOGLOBIN A1C: Hgb A1c MFr Bld: 8.4 % — ABNORMAL HIGH (ref 4.6–6.5)

## 2017-07-05 LAB — TSH: TSH: 2.09 u[IU]/mL (ref 0.35–4.50)

## 2017-07-05 LAB — PSA, MEDICARE: PSA: 2.3 ng/mL (ref 0.10–4.00)

## 2017-07-05 MED ORDER — METFORMIN HCL ER 500 MG PO TB24: ORAL_TABLET | ORAL | 11 refills | Status: DC

## 2017-07-05 MED ORDER — GLIPIZIDE ER 5 MG PO TB24: 5.0000 mg | ORAL_TABLET | Freq: Every day | ORAL | 11 refills | Status: DC

## 2017-07-05 MED ORDER — ATORVASTATIN CALCIUM 10 MG PO TABS: 10.0000 mg | ORAL_TABLET | Freq: Every day | ORAL | 11 refills | Status: DC

## 2017-07-05 NOTE — Progress Notes (Signed)
Subjective:    Patient ID: Aaron Thompson, male    DOB: Jun 12, 1960, 57 y.o.   MRN: [id number]  HPI Here for f/u of chronic medical problems   Not doing a lot  Some light work and fishing  Now has his ss disability for CHF and a fib --- still has random episodes of irregular HR  Brief episodes of a fib (recent one however lasted over 2 hours) - went into V tach Has appt with heart failure clinic upcoming  Still on xarelto and tykosin  entresto has helped him a lot with CHF /sob on exertion   Can go on short shopping trips   May need another ablation   Cannot have an exercise program of any type (per pt)  Walks his dog 100 feet at a time  Can go fishing- needs help getting boat in / etc  Can ride mow/ just push mow    Wt Readings from Last 3 Encounters:  07/05/17 299 lb (135.6 kg)  04/06/17 297 lb 12.8 oz (135.1 kg)  03/30/17 298 lb 12 oz (135.5 kg)  fairly stable  Eating - is fair/ he tries to eat low carb most of the time (but has some biscuits)  Chooses wheat bread  36.40 kg/m   bp is stable today  No cp or palpitations or headaches or edema  No side effects to medicines  BP Readings from Last 3 Encounters:  07/05/17 128/82  04/06/17 112/76  03/30/17 128/78     DM2 Lab Results  Component Value Date   HGBA1C 7.8 (H) 10/05/2016  metformin xr 1500 once daly Glipizide glipizide xl 5 mg  Eye exam -almost a year ago (no retinopathy)  On arb for renal prot Still running 160-170 usually / he does not check that often  No hypoglycemia  Has done diabetic teaching  Avoids eating out most of the time - wife prep food  Thinks A1c will be a bit better    Hyperlipidemia Lab Results  Component Value Date   CHOL 160 06/16/2016   HDL 39.50 06/16/2016   LDLCALC 94 04/07/2015   LDLDIRECT 97.0 06/16/2016   TRIG 221.0 (H) 06/16/2016   CHOLHDL 4 06/16/2016   Due for labs  Red meat once per week  occ fried food  Not on a statin but open to it    Prostate cancer  screen Lab Results  Component Value Date   PSA 2.12 06/16/2016   he needs to follow up with urology    Patient Active Problem List   Diagnosis Date Noted  . History of colonic polyps   . Lower back pain 09/07/2016  . Family history of prostate cancer 06/23/2016  . Frequent urination 06/23/2016  . Encounter for hepatitis C screening test for low risk patient 06/23/2016  . Encounter for screening for HIV 06/23/2016  . Colon polyps 06/23/2016  . Laboratory examination ordered as part of a routine general medical examination 03/08/2016  . Routine general medical examination at a health care facility 03/08/2016  . Prostate cancer screening 03/08/2016  . A-fib ([place]) 10/14/2015  . Hypokalemia 08/12/2015  . Inappropriate shocks from ICD (implantable cardioverter-defibrillator) 03/17/2015  . Hearing loss of right ear due to cerumen impaction 03/13/2015  . Obesity 12/05/2014  . Osteoarthritis of both hands 07/31/2014  . Low serum potassium level 04/21/2014  . ICD (implantable cardioverter-defibrillator) discharge 04/19/2014  . Chronic systolic heart failure ([place]) 02/14/2013  . Hyperlipidemia 02/05/2013  . Diabetes type 2, uncontrolled ([place]) 09/30/2011  .  Nonischemic cardiomyopathy ([place]) 08/18/2011  . Chest pain on exertion 08/17/2011  . Palpitations 05/12/2010  . CARDIOMYOPATHY OTHER DISEASES CLASSIFIED ELSW 07/16/2009  . OSTEOARTHROSIS UNSPEC WHETHER GEN/LOCALIZED HAND 08/16/2008  . Essential hypertension 05/11/2007  . History of renal calculi 05/11/2007  . Fatty liver 03/20/2007  . ANXIETY 10/04/2006  . HEARING IMPAIRMENT 10/04/2006  . FOLLICULITIS 13/24/4010  . GERD 09/20/2006   Past Medical History:  Diagnosis Date  . AICD (automatic cardioverter/defibrillator) present   . Anxiety state, unspecified   . Calculus of kidney 1981   "passed it on my own"  . Chronic systolic CHF (congestive heart failure) ([place])   . Dermatophytosis of the body   . DJD (degenerative joint  disease)   . Esophageal reflux   . Hx of colonic polyps   . Hypertension   . LBBB (left bundle branch block)    intermittent  . Non-ischemic cardiomyopathy ([place])    a. cath 2013: minor nonobstructive CAD.  . OSA on CPAP    pt can't use it.  . Osteoarthrosis, unspecified whether generalized or localized, hand   . Other chronic nonalcoholic liver disease    fatty liver  . PAF (paroxysmal atrial fibrillation) ([place])    a. s/p DCCV 6/11; previously on Pradaxa;  b. Event Monitor 2012->No PAF;  c. 09/2011 s/p DCCV ->Xarelto started. d. 03/2014: inappropriate ICD shocks for AF-RVR, started on Tikosyn, Xarelto restarted.  . Type II diabetes mellitus ([place])   . Unspecified hearing loss    no hearing aid   Past Surgical History:  Procedure Laterality Date  . Abdominal US  01/2007   Fatty liver, no gallstones  . APPENDECTOMY    . ATRIAL TACH ABLATION  09/2015  . BI-VENTRICULAR IMPLANTABLE CARDIOVERTER DEFIBRILLATOR [name] 02/14/2013   STJ CRTD implanted by Dr Caryl Comes  . BILATERAL KNEE ARTHROSCOPY    . CARDIOVERSION  09/29/2011   Procedure: CARDIOVERSION;  Surgeon: Thayer Headings, MD;  Location: [facility];  Service: Cardiovascular;  Laterality: [name];  . COLONOSCOPY WITH PROPOFOL [name] 10/26/2016   Procedure: COLONOSCOPY WITH PROPOFOL;  Surgeon: Irene Shipper, MD;  [facility];  Service: Endoscopy;  Laterality: [name];  . DOPPLER ECHOCARDIOGRAPHY  11/2009   Decreased EF of 35-40%  . ELECTROPHYSIOLOGIC STUDY [name] 10/14/2015   Procedure: Atrial Fibrillation Ablation;  Surgeon: [name], MD;  Location: [facility];  Service: Cardiovascular;  Laterality: [name];  . LEFT HEART CATHETERIZATION WITH CORONARY ANGIOGRAM [name] 08/18/2011   Procedure: LEFT HEART CATHETERIZATION WITH CORONARY ANGIOGRAM;  Surgeon: Peter M Martinique, MD;  Location: [facility] CATH LAB;  Service: Cardiovascular;  Laterality: [name];  . NECK SURGERY     plate/fusion  . Stress Cardiolite  08/2005   Normal, EF 42%  . UPPER GASTROINTESTINAL  ENDOSCOPY  08/2006   GERD   Social History   Tobacco Use  . Smoking status: Never Smoker  . Smokeless tobacco: Former Systems developer    Types: Snuff  . Tobacco comment: 02/14/2013 "quit snuff in 2006"  Substance Use Topics  . Alcohol use: No    Alcohol/week: 0.0 oz  . Drug use: No   Family History  Problem Relation Age of Onset  . Hypertension Mother   . Diabetes Mother   . Heart failure Mother        Died @ 6  . Hypertension Father   . Heart failure Father        Died @ 60  . Prostate cancer Father   . Diabetes Brother   .  Kidney disease Brother   . Diabetes Brother   . Prostate cancer Brother   . Diabetes Brother   . Other Sister   . Heart attack Paternal Grandfather   . Stroke Paternal Grandmother   . Colon cancer Paternal Grandmother   . Multiple myeloma Sister   . Leukemia Sister   . Colon polyps Sister   . Cirrhosis Maternal Uncle        alcohol related  . Esophageal cancer Neg Hx   . Stomach cancer Neg Hx   . Pancreatic cancer Neg Hx    No Known Allergies Current Outpatient Medications on File Prior to Visit  Medication Sig Dispense Refill  . carvedilol (COREG) 25 MG tablet Take 2 tablets (50 mg total) by mouth 2 (two) times daily with a meal. 120 tablet 11  . cetirizine (ZYRTEC) 10 MG tablet Take 10 mg by mouth daily as needed for allergies.    [name] dofetilide (TIKOSYN) 500 MCG capsule TAKE 1 CAPSULE(500 MCG) BY MOUTH TWICE DAILY 180 capsule 3  . glucose blood (ONE TOUCH ULTRA TEST) test strip CHECK GLUCOSE TWICE DAILY AND AS NEEDED, DX. E11.65 (UNCONTROLLED DM) 100 each 5  . Multiple Vitamin (MULTIVITAMIN) tablet Take 1 tablet by mouth daily.      [name] Bicarbonate (ZEGERID OTC) 20-1100 MG CAPS Take 1 capsule by mouth 2 (two) times daily.     . Potassium Chloride ER 20 MEQ TBCR TAKE 2 TABLETS BY MOUTH IN THE MORNING AND 1/2 TABLET IN THE EVENING 105 tablet 3  . sacubitril-valsartan (ENTRESTO) 97-103 MG Take 1 tablet by mouth 2 (two) times daily. 60  tablet 11  . spironolactone (ALDACTONE) 25 MG tablet Take 1 tablet (25 mg total) by mouth daily. 30 tablet 11  . traMADol (ULTRAM) 50 MG tablet Take 1 tablet (50 mg total) by mouth 2 (two) times daily. 60 tablet 5  . XARELTO 20 MG TABS tablet TAKE 1 TABLET(20 MG) BY MOUTH DAILY WITH SUPPER 30 tablet 5   No current facility-administered medications on file prior to visit.     Review of Systems  Constitutional: Negative for activity change, appetite change, fatigue, fever and unexpected weight change.  HENT: Negative for congestion, rhinorrhea, sore throat and trouble swallowing.   Eyes: Negative for pain, redness, itching and visual disturbance.  Respiratory: Positive for shortness of breath. Negative for cough, chest tightness, wheezing and stridor.        Baseline sob on exertion  No rapid HR or palpitations today  Cardiovascular: Negative for chest pain and palpitations.  Gastrointestinal: Negative for abdominal pain, blood in stool, constipation, diarrhea and nausea.  Endocrine: Negative for cold intolerance, heat intolerance, polydipsia and polyuria.  Genitourinary: Negative for difficulty urinating, dysuria, frequency and urgency.  Musculoskeletal: Negative for arthralgias, joint swelling and myalgias.  Skin: Negative for pallor and rash.  Neurological: Negative for dizziness, tremors, weakness, numbness and headaches.  Hematological: Negative for adenopathy. Does not bruise/bleed easily.  Psychiatric/Behavioral: Negative for decreased concentration and dysphoric mood. The patient is not nervous/anxious.        Objective:   Physical Exam  Constitutional: He appears well-developed and well-nourished. No distress.  obese and well appearing   HENT:  Head: Normocephalic and atraumatic.  Mouth/Throat: Oropharynx is clear and moist.  Eyes: Pupils are equal, round, and reactive to light. Conjunctivae and EOM are normal.  Neck: Normal range of motion. Neck supple. No JVD present.  Carotid bruit is not present. No thyromegaly present.  Cardiovascular: Normal rate,  regular rhythm, normal heart sounds and intact distal pulses. Exam reveals no gallop.  Pulmonary/Chest: Effort normal and breath sounds normal. No respiratory distress. He has no wheezes. He has no rales.  No crackles  Abdominal: Soft. Bowel sounds are normal. He exhibits no distension, no abdominal bruit and no mass. There is no tenderness.  Musculoskeletal: He exhibits no edema or tenderness.  Lymphadenopathy:    He has no cervical adenopathy.  Neurological: He is alert. He has normal reflexes. He displays normal reflexes. No cranial nerve deficit. He exhibits normal muscle tone. Coordination normal.  Skin: Skin is warm and dry. Capillary refill takes less than 2 seconds. No rash noted.  Psychiatric: He has a normal mood and affect.  pleasant          Assessment & Plan:   Problem List Items Addressed This Visit      Cardiovascular and Mediastinum   Chronic systolic heart failure ([place])    Per pt fairly stable - is sob on exertion/ limited in activities  Entresto has helped       Relevant Medications   atorvastatin (LIPITOR) 10 MG tablet   Essential hypertension    bp in fair control at this time  BP Readings from Last 1 Encounters:  07/05/17 128/82   No changes needed Most recent labs reviewed  Disc lifstyle change with low sodium diet and exercise        Relevant Medications   atorvastatin (LIPITOR) 10 MG tablet   Other Relevant Orders   Comprehensive metabolic panel (Completed)   Lipid panel (Completed)   TSH (Completed)     Endocrine   Diabetes type 2, uncontrolled ([place]) - Primary    A1C today Metformin xr and glipizide  Diet is a bit better  Has done DM teaching  Arb for renal protection  Disc foot and eye care  Eye exam is almost due -he will schedule      Relevant Medications   glipiZIDE (GLUCOTROL XL) 5 MG 24 hr tablet   metFORMIN (GLUCOPHAGE-XR) 500 MG 24 hr  tablet   atorvastatin (LIPITOR) 10 MG tablet   Other Relevant Orders   Hemoglobin A1c (Completed)     Other   Hyperlipidemia    Disc goals for lipids and reasons to control them Rev last labs with pt Rev low sat fat diet in detail In light of DM will start him on low dose atorvastatin  Re check 3 mo       Relevant Medications   atorvastatin (LIPITOR) 10 MG tablet   Other Relevant Orders   Lipid panel (Completed)   Obesity    Discussed how this problem influences overall health and the risks it imposes  Reviewed plan for weight loss with lower calorie diet (via better food choices and also portion control or program like weight watchers) and exercise building up to or more than 30 minutes 5 days per week including some aerobic activity   Exercise is limited  Made plan for diet with less processed carbs      Relevant Medications   glipiZIDE (GLUCOTROL XL) 5 MG 24 hr tablet   metFORMIN (GLUCOPHAGE-XR) 500 MG 24 hr tablet   Prostate cancer screening    psa today  Family hx known  Pt will schedule his annual urology f/u as well      Relevant Orders   PSA, Medicare (Completed)

## 2017-07-05 NOTE — Patient Instructions (Addendum)
For diabetes  Try to get most of your carbohydrates from produce (with the exception of white potatoes)  Eat less bread/pasta/rice/snack foods/cereals/sweets and other items from the middle of the grocery store (processed carbs)  For cholesterol  Avoid red meat/ fried foods/ egg yolks/ fatty breakfast meats/ butter, cheese and high fat dairy/ and shellfish     Don't forget to schedule an appt with urology  Start low dose atorvastatin for cholesterol / this is recommended for all diabetics now   Follow up in 3 months with labs prior

## 2017-07-06 NOTE — Assessment & Plan Note (Signed)
A1C today Metformin xr and glipizide  Diet is a bit better  Has done DM teaching  Arb for renal protection  Disc foot and eye care  Eye exam is almost due -he will schedule

## 2017-07-06 NOTE — Assessment & Plan Note (Signed)
psa today  Family hx known  Pt will schedule his annual urology f/u as well

## 2017-07-06 NOTE — Assessment & Plan Note (Signed)
Disc goals for lipids and reasons to control them Rev last labs with pt Rev low sat fat diet in detail In light of DM will start him on low dose atorvastatin  Re check 3 mo

## 2017-07-06 NOTE — Assessment & Plan Note (Signed)
Discussed how this problem influences overall health and the risks it imposes  Reviewed plan for weight loss with lower calorie diet (via better food choices and also portion control or program like weight watchers) and exercise building up to or more than 30 minutes 5 days per week including some aerobic activity   Exercise is limited  Made plan for diet with less processed carbs

## 2017-07-06 NOTE — Assessment & Plan Note (Signed)
Per pt fairly stable - is sob on exertion/ limited in activities  Entresto has helped

## 2017-07-06 NOTE — Assessment & Plan Note (Signed)
bp in fair control at this time  BP Readings from Last 1 Encounters:  07/05/17 128/82   No changes needed Most recent labs reviewed  Disc lifstyle change with low sodium diet and exercise

## 2017-07-07 ENCOUNTER — Encounter (HOSPITAL_COMMUNITY): Payer: BLUE CROSS/BLUE SHIELD | Admitting: Internal Medicine

## 2017-07-08 ENCOUNTER — Ambulatory Visit (HOSPITAL_COMMUNITY)
Admission: RE | Admit: 2017-07-08 | Discharge: 2017-07-08 | Disposition: A | Discharge status: Home / Self Care | Payer: Medicare HMO | Source: Ambulatory Visit | Attending: Internal Medicine | Admitting: Internal Medicine

## 2017-07-08 ENCOUNTER — Telehealth: Payer: Self-pay | Admitting: *Deleted

## 2017-07-08 ENCOUNTER — Encounter (HOSPITAL_COMMUNITY): Payer: Self-pay | Admitting: Internal Medicine

## 2017-07-08 VITALS — BP 122/78 | HR 88 | Wt 301.8 lb

## 2017-07-08 DIAGNOSIS — I110 Hypertensive heart disease with heart failure: Secondary | ICD-10-CM | POA: Diagnosis not present

## 2017-07-08 DIAGNOSIS — E119 Type 2 diabetes mellitus without complications: Secondary | ICD-10-CM | POA: Insufficient documentation

## 2017-07-08 DIAGNOSIS — K219 Gastro-esophageal reflux disease without esophagitis: Secondary | ICD-10-CM | POA: Insufficient documentation

## 2017-07-08 DIAGNOSIS — H9190 Unspecified hearing loss, unspecified ear: Secondary | ICD-10-CM | POA: Insufficient documentation

## 2017-07-08 DIAGNOSIS — Z95810 Presence of automatic (implantable) cardiac defibrillator: Secondary | ICD-10-CM | POA: Insufficient documentation

## 2017-07-08 DIAGNOSIS — Z7901 Long term (current) use of anticoagulants: Secondary | ICD-10-CM | POA: Insufficient documentation

## 2017-07-08 DIAGNOSIS — G4730 Sleep apnea, unspecified: Secondary | ICD-10-CM

## 2017-07-08 DIAGNOSIS — G4733 Obstructive sleep apnea (adult) (pediatric): Secondary | ICD-10-CM | POA: Diagnosis not present

## 2017-07-08 DIAGNOSIS — I5022 Chronic systolic (congestive) heart failure: Secondary | ICD-10-CM

## 2017-07-08 DIAGNOSIS — M1990 Unspecified osteoarthritis, unspecified site: Secondary | ICD-10-CM | POA: Diagnosis not present

## 2017-07-08 DIAGNOSIS — I2510 Atherosclerotic heart disease of native coronary artery without angina pectoris: Secondary | ICD-10-CM | POA: Insufficient documentation

## 2017-07-08 DIAGNOSIS — I480 Paroxysmal atrial fibrillation: Secondary | ICD-10-CM

## 2017-07-08 DIAGNOSIS — Z87442 Personal history of urinary calculi: Secondary | ICD-10-CM | POA: Insufficient documentation

## 2017-07-08 DIAGNOSIS — I429 Cardiomyopathy, unspecified: Secondary | ICD-10-CM | POA: Insufficient documentation

## 2017-07-08 DIAGNOSIS — Z800 Family history of malignant neoplasm of digestive organs: Secondary | ICD-10-CM | POA: Diagnosis not present

## 2017-07-08 DIAGNOSIS — Z8249 Family history of ischemic heart disease and other diseases of the circulatory system: Secondary | ICD-10-CM | POA: Insufficient documentation

## 2017-07-08 DIAGNOSIS — E118 Type 2 diabetes mellitus with unspecified complications: Secondary | ICD-10-CM | POA: Diagnosis not present

## 2017-07-08 DIAGNOSIS — Z79899 Other long term (current) drug therapy: Secondary | ICD-10-CM | POA: Insufficient documentation

## 2017-07-08 DIAGNOSIS — Z7984 Long term (current) use of oral hypoglycemic drugs: Secondary | ICD-10-CM | POA: Insufficient documentation

## 2017-07-08 DIAGNOSIS — Z8371 Family history of colonic polyps: Secondary | ICD-10-CM | POA: Diagnosis not present

## 2017-07-08 DIAGNOSIS — Z9889 Other specified postprocedural states: Secondary | ICD-10-CM | POA: Diagnosis not present

## 2017-07-08 MED ORDER — EMPAGLIFLOZIN 10 MG PO TABS: 10.0000 mg | ORAL_TABLET | Freq: Every day | ORAL | 11 refills | Status: DC

## 2017-07-08 MED ORDER — GLIPIZIDE ER 10 MG PO TB24: 10.0000 mg | ORAL_TABLET | Freq: Every day | ORAL | 11 refills | Status: DC

## 2017-07-08 NOTE — Patient Instructions (Signed)
START Jardiance 10 mg, one tab daily  Your physician recommends that you schedule a follow-up appointment in: 6 months with Dr Haroldine Laws  Do the following things EVERYDAY: 1) Weigh yourself in the morning before breakfast. Write it down and keep it in a log. 2) Take your medicines as prescribed 3) Eat low salt foods-Limit salt (sodium) to 2000 mg per day.  4) Stay as active as you can everyday 5) Limit all fluids for the day to less than 2 liters

## 2017-07-08 NOTE — Progress Notes (Signed)
ADVANCED HF CLINIC CONSULT NOTE  Referring Physician: Dr. [name]  Primary Cardiologist: Dr. [name]  HPI:  Aaron Thompson a 57 y.o.malewith history of PAF s/p AF ablation , HTN, morbid obesity, OSA unable to tolerate CPAP, DM, LBBB and chronic systolic HF (EF 95-62%) due to NICM s/p STJ CRT-D (cath 2013 minimal CAD)  referred by Dr. [name] for HF evaluation   Diagnosed with systolic HF in 1308. Cath at that time minimal CAD. Had STJ ICD placed.   Presented to ER with new-onset AF 7/17.Had DCCV in the ER. Admitted the same month with recurrent AF. Felt to to have both AFib and ATach. Subsequently seen by Dr. Rayann Heman and had afib ablation on 10/14/15. Subsequently h recurrent AF with RVR leading to ICD shocks. Placed back on tikosyn and anticoagulation. Now maintaining NSR on Tikosyn 500  [facility]ration. Now on full-dose Entrest, carvedilol and spiro. Says he is feeling pretty good. Says he can walk farther after starting Entresto. Has good days and bad days. Can do most of his activities without too much problem. Can play 9 holes of golf but has a hard time with 18 holes. Rides the cart but able to walk to the ball. No edema, orthopnea or PND.   ICD interrogation No VT/VF. Fluid level looks good. 98% biv pacing  Echo 12/14/16 EF 20-25%  CPX 8/13  FVC 5.14 (84%)    FEV1 3.93 (83%)     FEV1/FVC 76%     MVV 145 (83%)  Resting HR: 79 Peak HR: 171  % age predicted max HR: 100% BP rest: 109/77 BP peak: 194/76 (IPE)  Peak VO2: 24.5 % predicted peak VO2: 88.7% VE/VCO2 slope: 27.0 OUES: 3.10 Peak RER: 1.19 Ventilatory Threshold: 17.1 % predicted peak VO2: 61.9% VE/MVV: 74.5% O2pulse: 19  % predicted O2pulse: 90%   Review of Systems: [y] = yes, '[ ]'  = no   General: Weight gain '[ ]' ; Weight loss '[ ]' ; Anorexia '[ ]' ; Fatigue Blue.Reese ]; Fever '[ ]' ; Chills '[ ]' ; Weakness '[ ]'   Cardiac: Chest pain/pressure '[ ]' ; Resting SOB '[ ]' ;  Exertional SOB [ y]; Orthopnea '[ ]' ; Pedal Edema '[ ]' ; Palpitations '[ ]' ; Syncope '[ ]' ; Presyncope '[ ]' ; Paroxysmal nocturnal dyspnea'[ ]'   Pulmonary: Cough '[ ]' ; Wheezing'[ ]' ; Hemoptysis'[ ]' ; Sputum '[ ]' ; Snoring '[ ]'   GI: Vomiting'[ ]' ; Dysphagia'[ ]' ; Melena'[ ]' ; Hematochezia '[ ]' ; Heartburn'[ ]' ; Abdominal pain '[ ]' ; Constipation '[ ]' ; Diarrhea '[ ]' ; BRBPR '[ ]'   GU: Hematuria'[ ]' ; Dysuria '[ ]' ; Nocturia'[ ]'   Vascular: Pain in legs with walking '[ ]' ; Pain in feet with lying flat '[ ]' ; Non-healing sores '[ ]' ; Stroke '[ ]' ; TIA '[ ]' ; Slurred speech '[ ]' ;  Neuro: Headaches'[ ]' ; Vertigo'[ ]' ; Seizures'[ ]' ; Paresthesias'[ ]' ;Blurred vision '[ ]' ; Diplopia '[ ]' ; Vision changes '[ ]'   Ortho/Skin: Arthritis Blue.Reese ]; Joint pain Blue.Reese ]; Muscle pain '[ ]' ; Joint swelling '[ ]' ; Back Pain '[ ]' ; Rash '[ ]'   Psych: Depression'[ ]' ; Anxiety[y ]  Heme: Bleeding problems '[ ]' ; Clotting disorders '[ ]' ; Anemia '[ ]'   Endocrine: Diabetes [ y]; Thyroid dysfunction'[ ]'    Past Medical History:  Diagnosis Date  . AICD (automatic cardioverter/defibrillator) present   . Anxiety state, unspecified   . Calculus of kidney 1981   "passed it on my own"  . Chronic systolic CHF (congestive heart failure) ([place])   . Dermatophytosis of the body   . DJD (  degenerative joint disease)   . Esophageal reflux   . Hx of colonic polyps   . Hypertension   . LBBB (left bundle branch block)    intermittent  . Non-ischemic cardiomyopathy ([place])    a. cath 2013: minor nonobstructive CAD.  . OSA on CPAP    pt can't use it.  . Osteoarthrosis, unspecified whether generalized or localized, hand   . Other chronic nonalcoholic liver disease    fatty liver  . PAF (paroxysmal atrial fibrillation) ([place])    a. s/p DCCV 6/11; previously on Pradaxa;  b. Event Monitor 2012->No PAF;  c. 09/2011 s/p DCCV ->Xarelto started. d. 03/2014: inappropriate ICD shocks for AF-RVR, started on Tikosyn, Xarelto restarted.  . Type II diabetes mellitus ([place])   . Unspecified hearing loss    no hearing aid    Current  Outpatient Medications  Medication Sig Dispense Refill  . atorvastatin (LIPITOR) 10 MG tablet Take 1 tablet (10 mg total) by mouth daily. 30 tablet 11  . carvedilol (COREG) 25 MG tablet Take 2 tablets (50 mg total) by mouth 2 (two) times daily with a meal. 120 tablet 11  . cetirizine (ZYRTEC) 10 MG tablet Take 10 mg by mouth daily as needed for allergies.    [name] dofetilide (TIKOSYN) 500 MCG capsule TAKE 1 CAPSULE(500 MCG) BY MOUTH TWICE DAILY 180 capsule 3  . glipiZIDE (GLIPIZIDE XL) 10 MG 24 hr tablet Take 1 tablet (10 mg total) by mouth daily with breakfast. 30 tablet 11  . glucose blood (ONE TOUCH ULTRA TEST) test strip CHECK GLUCOSE TWICE DAILY AND AS NEEDED, DX. E11.65 (UNCONTROLLED DM) 100 each 5  . metFORMIN (GLUCOPHAGE-XR) 500 MG 24 hr tablet TAKE 3 TABLETS(1500 MG) BY MOUTH DAILY WITH BREAKFAST 90 tablet 11  . Multiple Vitamin (MULTIVITAMIN) tablet Take 1 tablet by mouth daily.      [name] Bicarbonate (ZEGERID OTC) 20-1100 MG CAPS Take 1 capsule by mouth 2 (two) times daily.     . potassium chloride SA (K-DUR,KLOR-CON) 20 MEQ tablet Take 40 mEq by mouth 2 (two) times daily.    . sacubitril-valsartan (ENTRESTO) 97-103 MG Take 1 tablet by mouth 2 (two) times daily. 60 tablet 11  . spironolactone (ALDACTONE) 25 MG tablet Take 1 tablet (25 mg total) by mouth daily. 30 tablet 11  . traMADol (ULTRAM) 50 MG tablet Take 1 tablet (50 mg total) by mouth 2 (two) times daily. 60 tablet 5  . XARELTO 20 MG TABS tablet TAKE 1 TABLET(20 MG) BY MOUTH DAILY WITH SUPPER 30 tablet 5   No current facility-administered medications for this encounter.     No Known Allergies    Social History   Socioeconomic History  . Marital status: Married    Spouse name: Not on file  . Number of children: 2  . Years of education: Not on file  . Highest education level: Not on file  Occupational History  . Occupation: Banker: UNEMPLOYED  Social Needs  . Financial resource strain: Not on  file  . Food insecurity:    Worry: Not on file    Inability: Not on file  . Transportation needs:    Medical: Not on file    Non-medical: Not on file  Tobacco Use  . Smoking status: Never Smoker  . Smokeless tobacco: Former Systems developer    Types: Snuff  . Tobacco comment: 02/14/2013 "quit snuff in 2006"  Substance and Sexual Activity  . Alcohol use: No    Alcohol/week:  0.0 oz  . Drug use: No  . Sexual activity: Yes  Lifestyle  . Physical activity:    Days per week: Not on file    Minutes per session: Not on file  . Stress: Not on file  Relationships  . Social connections:    Talks on phone: Not on file    Gets together: Not on file    Attends religious service: Not on file    Active member of club or organization: Not on file    Attends meetings of clubs or organizations: Not on file    Relationship status: Not on file  . Intimate partner violence:    Fear of current or ex partner: Not on file    Emotionally abused: Not on file    Physically abused: Not on file    Forced sexual activity: Not on file  Other Topics Concern  . Not on file  Social History Narrative   Lives in [place] with wife.  Works @ [employer] in [employer].  Not routinely exercising.      Family History  Problem Relation Age of Onset  . Hypertension Mother   . Diabetes Mother   . Heart failure Mother        Died @ 34  . Hypertension Father   . Heart failure Father        Died @ 80  . Prostate cancer Father   . Diabetes Brother   . Kidney disease Brother   . Diabetes Brother   . Prostate cancer Brother   . Diabetes Brother   . Other Sister   . Heart attack Paternal Grandfather   . Stroke Paternal Grandmother   . Colon cancer Paternal Grandmother   . Multiple myeloma Sister   . Leukemia Sister   . Colon polyps Sister   . Cirrhosis Maternal Uncle        alcohol related  . Esophageal cancer Neg Hx   . Stomach cancer Neg Hx   . Pancreatic cancer Neg Hx     Vitals:   07/08/17 1512    BP: 122/78  Pulse: 88  SpO2: 97%  Weight: (!) 301 lb 12.8 oz (136.9 kg)    PHYSICAL EXAM: General:  Well appearing. No respiratory difficulty HEENT: normal Neck: supple. no JVD. Carotids 2+ bilat; no bruits. No lymphadenopathy or thryomegaly appreciated. Cor: PMI nondisplaced. Regular rate & rhythm. No rubs, gallops or murmurs. Lungs: clear Abdomen: obese soft, nontender, nondistended. No hepatosplenomegaly. No bruits or masses. Good bowel sounds. Extremities: no cyanosis, clubbing, rash, edema Neuro: alert & oriented x 3, cranial nerves grossly intact. moves all 4 extremities w/o difficulty. Affect pleasant.   ASSESSMENT & PLAN: 1. Chronic systolic failure due to NICM s/p STJ CRT-D - cath 2013 non-obstructive CAD - Echo 11/18 EF 20-25%  - NYHA II-III (closer to III) - Volume status ok. Reinforced need for daily weights and reviewed use of sliding scale diuretics. - On goal-dose HF meds: Entresto 97/103 bid, carvedilol 25 bid and spiro 25 daily - Continue current therapy - Will see q6 months. If functional status declines will need repeat CPX  2. PAF - s/p AF ablation - maintaining NSR on Tikosyn. Continue Xarelto for AC  3. OSA - unable to tolerate CPAP - weight loss recommended.   4. DM2  - add jardiance 10 - continue statin and Jeanie Sewer, MD  11:53 PM    .

## 2017-07-08 NOTE — Telephone Encounter (Signed)
PEC nurse advise pt of results and Rx sent

## 2017-07-08 NOTE — Telephone Encounter (Signed)
-----   Message from Aaron Greenspan, MD sent at 07/06/2017  8:07 AM EDT ----- A1C is very high at 8.4 with glucose of 242 I want to increase his glipizide XL to 10 mg once daily #30 11 ref   (check glucose regularly as this can cause hypoglycemia) F/u 3 mo as planned Please try to stay closely to a diabetic diet  Other labs stable

## 2017-07-13 ENCOUNTER — Other Ambulatory Visit: Payer: Self-pay | Admitting: Family Medicine

## 2017-07-28 ENCOUNTER — Other Ambulatory Visit: Payer: Self-pay | Admitting: Family Medicine

## 2017-07-29 ENCOUNTER — Telehealth: Payer: Self-pay | Admitting: Cardiology

## 2017-07-29 NOTE — Telephone Encounter (Signed)
Spoke w/ pt and requested that he send a manual transmission b/c his home monitor has not updated in at least 7 days.   

## 2017-08-01 ENCOUNTER — Encounter: Payer: Self-pay | Admitting: Cardiovascular Disease

## 2017-08-04 ENCOUNTER — Other Ambulatory Visit: Payer: Self-pay | Admitting: Internal Medicine

## 2017-08-25 ENCOUNTER — Ambulatory Visit: Payer: BLUE CROSS/BLUE SHIELD | Admitting: Cardiovascular Disease

## 2017-09-13 ENCOUNTER — Telehealth: Payer: Self-pay | Admitting: Cardiology

## 2017-09-13 ENCOUNTER — Ambulatory Visit (INDEPENDENT_AMBULATORY_CARE_PROVIDER_SITE_OTHER): Payer: Medicare HMO | Admitting: *Deleted

## 2017-09-13 DIAGNOSIS — I428 Other cardiomyopathies: Secondary | ICD-10-CM | POA: Diagnosis not present

## 2017-09-13 NOTE — Telephone Encounter (Signed)
Spoke with pt and reminded pt of remote transmission that is due today. Pt verbalized understanding.   

## 2017-09-14 NOTE — Progress Notes (Signed)
Remote ICD transmission.   

## 2017-10-10 ENCOUNTER — Encounter: Payer: Self-pay | Admitting: Cardiology

## 2017-10-10 ENCOUNTER — Ambulatory Visit: Payer: Medicare HMO | Admitting: Cardiology

## 2017-10-10 VITALS — BP 106/80 | HR 76 | Ht 76.0 in | Wt 294.1 lb

## 2017-10-10 DIAGNOSIS — I10 Essential (primary) hypertension: Secondary | ICD-10-CM | POA: Diagnosis not present

## 2017-10-10 DIAGNOSIS — I5022 Chronic systolic (congestive) heart failure: Secondary | ICD-10-CM | POA: Diagnosis not present

## 2017-10-10 DIAGNOSIS — I480 Paroxysmal atrial fibrillation: Secondary | ICD-10-CM

## 2017-10-10 DIAGNOSIS — G4730 Sleep apnea, unspecified: Secondary | ICD-10-CM

## 2017-10-10 DIAGNOSIS — Z4502 Encounter for adjustment and management of automatic implantable cardiac defibrillator: Secondary | ICD-10-CM | POA: Diagnosis not present

## 2017-10-10 DIAGNOSIS — Z79899 Other long term (current) drug therapy: Secondary | ICD-10-CM

## 2017-10-10 DIAGNOSIS — I428 Other cardiomyopathies: Secondary | ICD-10-CM | POA: Diagnosis not present

## 2017-10-10 NOTE — Progress Notes (Signed)
Cardiology Office Note   Date:  10/10/2017   ID:  Aaron Thompson, DOB 11-May-1960, MRN [id number]  PCP:  Abner Greenspan, MD  Cardiologist:  Dr. [name]    Chief Complaint  Patient presents with  . Cardiomyopathy      History of Present Illness: Aaron Thompson is a 57 y.o. male who presents for PAF and HF.    PAF, NICM, HTN, OSA not wearing CPAP Last PAF with [facility] in ER 08/12/15  Baseline ECG with LBBB Had f/u afib ablation with Dr Rayann Heman 10/14/15 Having some issues with AICD shocking for rapid afib and placed back On tikosyn. And xarelto Doing well Got his disability. Son Has mild autism going to [facility] likes trains   Reviewed last echo 12/14/16 EF 20-25%  12/28/16 Entresto started and up titrated to max dose    Seen by Dr Caryl Comes 01/20/17 for CRT-ICD Pertinent Lab Values:  02/03/17 Serum creatinine 0..77 , BUN 14, Potassium 4.4, Sodium 139  Now on full-dose Entrest, carvedilol and spiro. Says he is feeling pretty good. Says he can walk farther after starting Entresto. Has good days and bad days. Can do most of his activities without too much problem. Can play 9 holes of golf but has a hard time with 18 holes. Rides the cart but able to walk to the ball. And he feels on jardiance he is doing better with his glucose control.  ICD interrogation per Dr. Jeffie Pollock  No VT/VF. Fluid level looks good. 98% biv pacing  Today as above without chest pain.  Mild dyspnea with activity.  He knows to stop and rest.  Watches his salt his wife cooks and she has decreased salt.     Past Medical History:  Diagnosis Date  . AICD (automatic cardioverter/defibrillator) present   . Anxiety state, unspecified   . Calculus of kidney 1981   "passed it on my own"  . Chronic systolic CHF (congestive heart failure) ([place])   . Dermatophytosis of the body   . DJD (degenerative joint disease)   . Esophageal reflux   . Hx of colonic polyps   . Hypertension   . LBBB (left bundle branch block)    intermittent  . Non-ischemic cardiomyopathy ([place])    a. cath 2013: minor nonobstructive CAD.  . OSA on CPAP    pt can't use it.  . Osteoarthrosis, unspecified whether generalized or localized, hand   . Other chronic nonalcoholic liver disease    fatty liver  . PAF (paroxysmal atrial fibrillation) ([place])    a. s/p DCCV 6/11; previously on Pradaxa;  b. Event Monitor 2012->No PAF;  c. 09/2011 s/p DCCV ->Xarelto started. d. 03/2014: inappropriate ICD shocks for AF-RVR, started on Tikosyn, Xarelto restarted.  . Type II diabetes mellitus ([place])   . Unspecified hearing loss    no hearing aid    Past Surgical History:  Procedure Laterality Date  . Abdominal US  01/2007   Fatty liver, no gallstones  . APPENDECTOMY    . ATRIAL TACH ABLATION  09/2015  . BI-VENTRICULAR IMPLANTABLE CARDIOVERTER DEFIBRILLATOR [name] 02/14/2013   STJ CRTD implanted by Dr Caryl Comes  . BILATERAL KNEE ARTHROSCOPY    . CARDIOVERSION  09/29/2011   Procedure: CARDIOVERSION;  Surgeon: Thayer Headings, MD;  Location: [facility];  Service: Cardiovascular;  Laterality: [name];  . COLONOSCOPY WITH PROPOFOL [name] 10/26/2016   Procedure: COLONOSCOPY WITH PROPOFOL;  Surgeon: Irene Shipper, MD;  Location: [facility] ENDOSCOPY;  Service: Endoscopy;  Laterality: [name];  .  DOPPLER ECHOCARDIOGRAPHY  11/2009   Decreased EF of 35-40%  . ELECTROPHYSIOLOGIC STUDY [name] 10/14/2015   Procedure: Atrial Fibrillation Ablation;  Surgeon: [name], MD;  Location: [facility] CV LAB;  Service: Cardiovascular;  Laterality: [name];  . LEFT HEART CATHETERIZATION WITH CORONARY ANGIOGRAM [name] 08/18/2011   Procedure: LEFT HEART CATHETERIZATION WITH CORONARY ANGIOGRAM;  Surgeon: Peter M Martinique, MD;  Location: [facility] CATH LAB;  Service: Cardiovascular;  Laterality: [name];  . NECK SURGERY     plate/fusion  . Stress Cardiolite  08/2005   Normal, EF 42%  . UPPER GASTROINTESTINAL ENDOSCOPY  08/2006   GERD     Current Outpatient Medications  Medication Sig Dispense Refill  . atorvastatin  (LIPITOR) 10 MG tablet Take 1 tablet (10 mg total) by mouth daily. 30 tablet 11  . carvedilol (COREG) 25 MG tablet Take 2 tablets (50 mg total) by mouth 2 (two) times daily with a meal. 120 tablet 11  . cetirizine (ZYRTEC) 10 MG tablet Take 10 mg by mouth daily as needed for allergies.    [name] dofetilide (TIKOSYN) 500 MCG capsule TAKE 1 CAPSULE(500 MCG) BY MOUTH TWICE DAILY 180 capsule 3  . empagliflozin (JARDIANCE) 10 MG TABS tablet Take 10 mg by mouth daily. 30 tablet 11  . glipiZIDE (GLIPIZIDE XL) 10 MG 24 hr tablet Take 1 tablet (10 mg total) by mouth daily with breakfast. 30 tablet 11  . glucose blood (ONE TOUCH ULTRA TEST) test strip CHECK GLUCOSE TWICE DAILY AND AS NEEDED, DX. E11.65 (UNCONTROLLED DM) 100 each 5  . metFORMIN (GLUCOPHAGE-XR) 500 MG 24 hr tablet TAKE 3 TABLETS(1500 MG) BY MOUTH DAILY WITH BREAKFAST 90 tablet 11  . metFORMIN (GLUCOPHAGE-XR) 500 MG 24 hr tablet TAKE 3 TABLETS(1500 MG) BY MOUTH DAILY WITH BREAKFAST 90 tablet 0  . Multiple Vitamin (MULTIVITAMIN) tablet Take 1 tablet by mouth daily.      [name] Bicarbonate (ZEGERID OTC) 20-1100 MG CAPS Take 1 capsule by mouth 2 (two) times daily.     . potassium chloride SA (K-DUR,KLOR-CON) 20 MEQ tablet Take 40 mEq by mouth 2 (two) times daily.    . sacubitril-valsartan (ENTRESTO) 97-103 MG Take 1 tablet by mouth 2 (two) times daily. 60 tablet 11  . spironolactone (ALDACTONE) 25 MG tablet Take 1 tablet (25 mg total) by mouth daily. 30 tablet 11  . traMADol (ULTRAM) 50 MG tablet Take 1 tablet (50 mg total) by mouth 2 (two) times daily. 60 tablet 5  . XARELTO 20 MG TABS tablet TAKE 1 TABLET(20 MG) BY MOUTH DAILY WITH SUPPER 30 tablet 5   No current facility-administered medications for this visit.     Allergies:   Patient has no known allergies.    Social History:  The patient  reports that he has never smoked. He quit smokeless tobacco use about 13 years ago.  His smokeless tobacco use included snuff. He reports that  he does not drink alcohol or use drugs.   Family History:  The patient's family history includes Cirrhosis in his maternal uncle; Colon cancer in his paternal grandmother; Colon polyps in his sister; Diabetes in his brother, brother, brother, and mother; Heart attack in his paternal grandfather; Heart failure in his father and mother; Hypertension in his father and mother; Kidney disease in his brother; Leukemia in his sister; Multiple myeloma in his sister; Other in his sister; Prostate cancer in his brother and father; Stroke in his paternal grandmother.    ROS:  General:no colds or fevers, + weight loss  Skin:no rashes or ulcers HEENT:no blurred vision, no congestion CV:see HPI PUL:see HPI GI:no diarrhea constipation or melena, no indigestion GU:no hematuria, no dysuria MS:no joint pain, no claudication Neuro:no syncope, no lightheadedness Endo+ diabetes better,, no thyroid disease  Wt Readings from Last 3 Encounters:  10/10/17 294 lb 1.9 oz (133.4 kg)  07/08/17 (!) 301 lb 12.8 oz (136.9 kg)  07/05/17 299 lb (135.6 kg)     PHYSICAL EXAM: VS:  BP 106/80   Pulse 76   Ht '6\' 4"'  (1.93 m)   Wt 294 lb 1.9 oz (133.4 kg)   SpO2 96%   BMI 35.80 kg/m  , BMI Body mass index is 35.8 kg/m. General:Pleasant affect, NAD Skin:Warm and dry, brisk capillary refill HEENT:normocephalic, sclera clear, mucus membranes moist Neck:supple, no JVD, no bruits  Heart:S1S2 RRR without murmur, gallup, rub or click Lungs:clear without rales, rhonchi, or wheezes GMW:NUUV, non tender, + BS, do not palpate liver spleen or masses Ext:no lower ext edema, 2+ pedal pulses, 2+ radial pulses Neuro:alert and oriented X 3, MAE, follows commands, + facial symmetry    EKG:  EKG is NOT ordered today.   Recent Labs: 12/14/2016: Hemoglobin 15.3; NT-Pro BNP 99; Platelets 215 04/06/2017: Magnesium 2.0 07/05/2017: ALT 18; TSH 2.09 10/10/2017: BUN 18; Creatinine, Ser 0.83; Potassium 4.7; Sodium 140    Lipid Panel     Component Value Date/Time   CHOL 165 07/05/2017 0839   TRIG 158.0 (H) 07/05/2017 0839   HDL 34.40 (L) 07/05/2017 0839   CHOLHDL 5 07/05/2017 0839   VLDL 31.6 07/05/2017 0839   LDLCALC 99 07/05/2017 0839   LDLDIRECT 97.0 06/16/2016 0755       Other studies Reviewed: Additional studies/ records that were reviewed today include: .  TTE 12/23/16 Conclusions  - Procedure narrative: Transthoracic echocardiography. Technically   difficult study. Intravenous contrast (Definity) was   administered. - Left ventricle: The cavity size was mildly dilated. Wall   thickness was normal. Systolic function was severely reduced. The   estimated ejection fraction was in the range of 20% to 25%.   Diffuse hypokinesis. Doppler parameters are consistent with   abnormal left ventricular relaxation (grade 1 diastolic   dysfunction). The E/e&' ratio is between 8-15, suggesting   indeterminate LV filling pressure. - Aorta: Dilated aorta. Aortic root dimension: 47 mm (ED).   Ascending aortic diameter: 40 mm (S). - Left atrium: The atrium was normal in size. - Right ventricle: Poorly visualized. AICD wire noted in right   ventricle. - Right atrium: AICD wire noted in right atrium. - Inferior vena cava: The vessel was normal in size. The   respirophasic diameter changes were in the normal range (>= 50%),   consistent with normal central venous pressure.  Impressions:  - Technically difficult study. Definity contrast was given. LVEF is   severely reduced at 20-25% with global hypokinesis. An AICD is in   place, the aorta is dilated at 4.7 cm at the root and up to 4.0   cm at the most distally visualized aspect of the ascending aorta   - CT correlation is recommended.  ASSESSMENT AND PLAN:  1.  PAF on tikosyn, in SR he may have brief break through but no a fib that last long.  Has not missed xarelto.   Follow up with Dr. [name] in 6 months 2.  chronic systolic HF euvolemic today now followed  with HF clinic Dr. Haroldine Laws. On entresto and coreg aldactone will check BMP today.  3.  HTN  controlled continue meds  4.  AICD followed by Dr. Caryl Comes no discharges recently , he will see EP in November .    5.  OSA uses CPAP    Current medicines are reviewed with the patient today.  The patient Has no concerns regarding medicines.  The following changes have been made:  See above Labs/ tests ordered today include:see above  Disposition:   FU:  see above  Signed, Cecilie Kicks, NP  10/10/2017 6:23 PM    [address] [place] [address] [place], [place], [place], [place] Phone: [phone]; Fax: [phone]

## 2017-10-10 NOTE — Patient Instructions (Addendum)
Medication Instructions:  Your physician recommends that you continue on your current medications as directed. Please refer to the Current Medication list given to you today.   Labwork: BMET & HGBA1C  Testing/Procedures: None ordered  Follow-Up: Your physician recommends that you schedule a follow-up appointment in: November [place], NP  Your physician recommends that you schedule a follow-up appointment in: November WITH DR. Arman Filter  Your physician wants you to follow-up in: [place] DR. Blima Singer will receive a reminder letter in the mail two months in advance. If you don't receive a letter, please call our office to schedule the follow-up appointment.  Any Other Special Instructions Will Be Listed Below (If Applicable).     If you need a refill on your cardiac medications before your next appointment, please call your pharmacy.

## 2017-10-11 LAB — BASIC METABOLIC PANEL
BUN / CREAT RATIO: 22 — AB (ref 9–20)
BUN: 18 mg/dL (ref 6–24)
CHLORIDE: 101 mmol/L (ref 96–106)
CO2: 23 mmol/L (ref 20–29)
CREATININE: 0.83 mg/dL (ref 0.76–1.27)
Calcium: 9.2 mg/dL (ref 8.7–10.2)
GFR calc Af Amer: 115 mL/min/{1.73_m2} (ref >59)
GFR calc non Af Amer: 99 mL/min/{1.73_m2} (ref >59)
GLUCOSE: 174 mg/dL — AB (ref 65–99)
Potassium: 4.7 mmol/L (ref 3.5–5.2)
Sodium: 140 mmol/L (ref 134–144)

## 2017-10-11 LAB — HEMOGLOBIN A1C
Est. average glucose Bld gHb Est-mCnc: 151 mg/dL
HEMOGLOBIN A1C: 6.9 % — AB (ref 4.8–5.6)

## 2017-10-11 NOTE — Progress Notes (Signed)
Pt has been made aware of normal result and verbalized understanding.  jw 10/11/17

## 2017-10-18 LAB — CUP PACEART REMOTE DEVICE CHECK
Date Time Interrogation Session: 20190924071211
Implantable Lead Implant Date: 20150121
Implantable Lead Location: 753858
Implantable Lead Location: 753859
Implantable Lead Model: 7122
Implantable Pulse Generator Implant Date: 20150121
MDC IDC LEAD IMPLANT DT: 20150121
MDC IDC LEAD IMPLANT DT: 20150121
MDC IDC LEAD LOCATION: 753860
MDC IDC PG SERIAL: 7133975

## 2017-11-04 DIAGNOSIS — R69 Illness, unspecified: Secondary | ICD-10-CM | POA: Diagnosis not present

## 2017-11-08 ENCOUNTER — Telehealth: Payer: Self-pay | Admitting: Cardiology

## 2017-11-08 NOTE — Telephone Encounter (Signed)
LMOVM requesting that pt send manual transmission b/c home monitor has not updated in at least 7 days.    

## 2017-12-05 ENCOUNTER — Telehealth: Payer: Self-pay | Admitting: Cardiology

## 2017-12-05 NOTE — Telephone Encounter (Signed)
LMOVM requesting that pt send manual transmission b/c home monitor has not updated in at least 7 days.    

## 2017-12-06 ENCOUNTER — Other Ambulatory Visit: Payer: Self-pay | Admitting: Cardiovascular Disease

## 2017-12-07 ENCOUNTER — Telehealth: Payer: Self-pay | Admitting: Nurse Practitioner

## 2017-12-07 ENCOUNTER — Encounter: Payer: Self-pay | Admitting: Nurse Practitioner

## 2017-12-07 ENCOUNTER — Ambulatory Visit: Payer: Medicare HMO | Admitting: Nurse Practitioner

## 2017-12-07 VITALS — BP 120/74 | HR 85 | Ht 76.0 in | Wt 294.0 lb

## 2017-12-07 DIAGNOSIS — I5022 Chronic systolic (congestive) heart failure: Secondary | ICD-10-CM | POA: Diagnosis not present

## 2017-12-07 DIAGNOSIS — G4733 Obstructive sleep apnea (adult) (pediatric): Secondary | ICD-10-CM | POA: Diagnosis not present

## 2017-12-07 DIAGNOSIS — I4819 Other persistent atrial fibrillation: Secondary | ICD-10-CM | POA: Diagnosis not present

## 2017-12-07 LAB — CUP PACEART INCLINIC DEVICE CHECK
Implantable Lead Implant Date: 20150121
Implantable Lead Implant Date: 20150121
Implantable Lead Location: 753859
Implantable Lead Location: 753860
Implantable Lead Model: 7122
MDC IDC LEAD IMPLANT DT: 20150121
MDC IDC LEAD LOCATION: 753858
MDC IDC PG IMPLANT DT: 20150121
MDC IDC SESS DTM: 20191113082752
Pulse Gen Serial Number: 7133975

## 2017-12-07 LAB — BASIC METABOLIC PANEL
BUN/Creatinine Ratio: 21 — ABNORMAL HIGH (ref 9–20)
BUN: 18 mg/dL (ref 6–24)
CALCIUM: 9.2 mg/dL (ref 8.7–10.2)
CHLORIDE: 99 mmol/L (ref 96–106)
CO2: 20 mmol/L (ref 20–29)
Creatinine, Ser: 0.86 mg/dL (ref 0.76–1.27)
GFR calc Af Amer: 112 mL/min/{1.73_m2} (ref >59)
GFR calc non Af Amer: 97 mL/min/{1.73_m2} (ref >59)
GLUCOSE: 180 mg/dL — AB (ref 65–99)
POTASSIUM: 4.5 mmol/L (ref 3.5–5.2)
SODIUM: 137 mmol/L (ref 134–144)

## 2017-12-07 LAB — MAGNESIUM: MAGNESIUM: 2.2 mg/dL (ref 1.6–2.3)

## 2017-12-07 NOTE — Telephone Encounter (Signed)
Pt's pharmacy is requesting a refill for tramadol. Would Dr. [name] like to refill this medication? Please address

## 2017-12-07 NOTE — Progress Notes (Signed)
Electrophysiology Office Note Date: 12/07/2017  ID:  Aaron Thompson, DOB 05-31-1960, MRN [id number]  PCP: Abner Greenspan, MD Primary Cardiologist: [place] Electrophysiologist: Caryl Comes  CC: Routine ICD follow-up  Aaron Thompson is a 57 y.o. male seen today for Dr Caryl Comes.  He presents today for routine electrophysiology followup.  Since last being seen in our clinic, the patient reports doing very well. He denies chest pain, palpitations, dyspnea, PND, orthopnea, nausea, vomiting, dizziness, syncope, edema, weight gain, or early satiety.  He has not had ICD shocks.   Device History: STJ CRTD implanted 2015 for NICM History of appropriate therapy: No - inappropriate therapy for AF with RVR History of AAD therapy: Yes - Tikosyn for AF  Past Medical History:  Diagnosis Date  . AICD (automatic cardioverter/defibrillator) present   . Anxiety state, unspecified   . Calculus of kidney 1981   "passed it on my own"  . Chronic systolic CHF (congestive heart failure) ([place])   . Dermatophytosis of the body   . DJD (degenerative joint disease)   . Esophageal reflux   . Hx of colonic polyps   . Hypertension   . LBBB (left bundle branch block)    intermittent  . Non-ischemic cardiomyopathy ([place])    a. cath 2013: minor nonobstructive CAD.  . OSA on CPAP    pt can't use it.  . Osteoarthrosis, unspecified whether generalized or localized, hand   . Other chronic nonalcoholic liver disease    fatty liver  . PAF (paroxysmal atrial fibrillation) ([place])    a. s/p DCCV 6/11; previously on Pradaxa;  b. Event Monitor 2012->No PAF;  c. 09/2011 s/p DCCV ->Xarelto started. d. 03/2014: inappropriate ICD shocks for AF-RVR, started on Tikosyn, Xarelto restarted.  . Type II diabetes mellitus ([place])   . Unspecified hearing loss    no hearing aid   Past Surgical History:  Procedure Laterality Date  . Abdominal US  01/2007   Fatty liver, no gallstones  . APPENDECTOMY    . ATRIAL TACH ABLATION  09/2015  .  BI-VENTRICULAR IMPLANTABLE CARDIOVERTER DEFIBRILLATOR [name] 02/14/2013   STJ CRTD implanted by Dr Caryl Comes  . BILATERAL KNEE ARTHROSCOPY    . CARDIOVERSION  09/29/2011   Procedure: CARDIOVERSION;  Surgeon: Thayer Headings, MD;  Location: [facility];  Service: Cardiovascular;  Laterality: [name];  . COLONOSCOPY WITH PROPOFOL [name] 10/26/2016   Procedure: COLONOSCOPY WITH PROPOFOL;  Surgeon: Irene Shipper, MD;  Location: [facility];  Service: Endoscopy;  Laterality: [name];  . DOPPLER ECHOCARDIOGRAPHY  11/2009   Decreased EF of 35-40%  . ELECTROPHYSIOLOGIC STUDY [name] 10/14/2015   Procedure: Atrial Fibrillation Ablation;  Surgeon: [name], MD;  Location: [facility];  Service: Cardiovascular;  Laterality: [name];  . LEFT HEART CATHETERIZATION WITH CORONARY ANGIOGRAM [name] 08/18/2011   Procedure: LEFT HEART CATHETERIZATION WITH CORONARY ANGIOGRAM;  Surgeon: Peter M Martinique, MD;  Location: [facility] CATH LAB;  Service: Cardiovascular;  Laterality: [name];  . NECK SURGERY     plate/fusion  . Stress Cardiolite  08/2005   Normal, EF 42%  . UPPER GASTROINTESTINAL ENDOSCOPY  08/2006   GERD    Current Outpatient Medications  Medication Sig Dispense Refill  . carvedilol (COREG) 25 MG tablet Take 2 tablets (50 mg total) by mouth 2 (two) times daily with a meal. 120 tablet 11  . cetirizine (ZYRTEC) 10 MG tablet Take 10 mg by mouth daily as needed for allergies.    [name] dofetilide (TIKOSYN) 500 MCG capsule TAKE 1  CAPSULE(500 MCG) BY MOUTH TWICE DAILY 180 capsule 3  . empagliflozin (JARDIANCE) 10 MG TABS tablet Take 10 mg by mouth daily. 30 tablet 11  . glipiZIDE (GLIPIZIDE XL) 10 MG 24 hr tablet Take 1 tablet (10 mg total) by mouth daily with breakfast. 30 tablet 11  . glucose blood (ONE TOUCH ULTRA TEST) test strip CHECK GLUCOSE TWICE DAILY AND AS NEEDED, DX. E11.65 (UNCONTROLLED DM) 100 each 5  . metFORMIN (GLUCOPHAGE-XR) 500 MG 24 hr tablet TAKE 3 TABLETS(1500 MG) BY MOUTH DAILY WITH BREAKFAST 90 tablet 0  . Multiple Vitamin  (MULTIVITAMIN) tablet Take 1 tablet by mouth daily.      [name] Bicarbonate (ZEGERID OTC) 20-1100 MG CAPS Take 1 capsule by mouth 2 (two) times daily.     . potassium chloride SA (K-DUR,KLOR-CON) 20 MEQ tablet Take 40 mEq by mouth 2 (two) times daily.    . sacubitril-valsartan (ENTRESTO) 97-103 MG Take 1 tablet by mouth 2 (two) times daily. 60 tablet 11  . spironolactone (ALDACTONE) 25 MG tablet Take 1 tablet (25 mg total) by mouth daily. 30 tablet 11  . traMADol (ULTRAM) 50 MG tablet Take 1 tablet (50 mg total) by mouth 2 (two) times daily. 60 tablet 5  . XARELTO 20 MG TABS tablet TAKE 1 TABLET(20 MG) BY MOUTH DAILY WITH SUPPER 30 tablet 5   No current facility-administered medications for this visit.     Allergies:   Patient has no known allergies.   Social History: Social History   Socioeconomic History  . Marital status: Married    Spouse name: Not on file  . Number of children: 2  . Years of education: Not on file  . Highest education level: Not on file  Occupational History  . Occupation: Banker: UNEMPLOYED  Social Needs  . Financial resource strain: Not on file  . Food insecurity:    Worry: Not on file    Inability: Not on file  . Transportation needs:    Medical: Not on file    Non-medical: Not on file  Tobacco Use  . Smoking status: Never Smoker  . Smokeless tobacco: Former Systems developer    Types: Snuff  . Tobacco comment: 02/14/2013 "quit snuff in 2006"  Substance and Sexual Activity  . Alcohol use: No    Alcohol/week: 0.0 standard drinks  . Drug use: No  . Sexual activity: Yes  Lifestyle  . Physical activity:    Days per week: Not on file    Minutes per session: Not on file  . Stress: Not on file  Relationships  . Social connections:    Talks on phone: Not on file    Gets together: Not on file    Attends religious service: Not on file    Active member of club or organization: Not on file    Attends meetings of clubs or organizations:  Not on file    Relationship status: Not on file  . Intimate partner violence:    Fear of current or ex partner: Not on file    Emotionally abused: Not on file    Physically abused: Not on file    Forced sexual activity: Not on file  Other Topics Concern  . Not on file  Social History Narrative   Lives in [place] with wife.  Works @ [employer] in [employer].  Not routinely exercising.    Family History: Family History  Problem Relation Age of Onset  . Hypertension Mother   .  Diabetes Mother   . Heart failure Mother        Died @ 18  . Hypertension Father   . Heart failure Father        Died @ 67  . Prostate cancer Father   . Diabetes Brother   . Kidney disease Brother   . Diabetes Brother   . Prostate cancer Brother   . Diabetes Brother   . Other Sister   . Heart attack Paternal Grandfather   . Stroke Paternal Grandmother   . Colon cancer Paternal Grandmother   . Multiple myeloma Sister   . Leukemia Sister   . Colon polyps Sister   . Cirrhosis Maternal Uncle        alcohol related  . Esophageal cancer Neg Hx   . Stomach cancer Neg Hx   . Pancreatic cancer Neg Hx     Review of Systems: All other systems reviewed and are otherwise negative except as noted above.   Physical Exam: VS:  BP 120/74   Pulse 85   Ht '6\' 4"'  (1.93 m)   Wt 294 lb (133.4 kg)   BMI 35.79 kg/m  , BMI Body mass index is 35.79 kg/m.  GEN- The patient is well appearing, alert and oriented x 3 today.   HEENT: normocephalic, atraumatic; sclera clear, conjunctiva pink; hearing intact; oropharynx clear; neck supple  Lungs- Clear to ausculation bilaterally, normal work of breathing.  No wheezes, rales, rhonchi Heart- Regular rate and rhythm  GI- soft, non-tender, non-distended, bowel sounds present  Extremities- no clubbing, cyanosis, or edema  MS- no significant deformity or atrophy Skin- warm and dry, no rash or lesion; ICD pocket well healed Psych- euthymic mood, full affect Neuro-  strength and sensation are intact  ICD interrogation- reviewed in detail today,  See PACEART report  EKG:  EKG is ordered today. The ekg ordered today shows sinus rhythm with V pacing, QTc 469mec   Recent Labs: 12/14/2016: Hemoglobin 15.3; NT-Pro BNP 99; Platelets 215 04/06/2017: Magnesium 2.0 07/05/2017: ALT 18; TSH 2.09 10/10/2017: BUN 18; Creatinine, Ser 0.83; Potassium 4.7; Sodium 140   Wt Readings from Last 3 Encounters:  12/07/17 294 lb (133.4 kg)  10/10/17 294 lb 1.9 oz (133.4 kg)  07/08/17 (!) 301 lb 12.8 oz (136.9 kg)     Other studies Reviewed: Additional studies/ records that were reviewed today include: Dr KCaryl Comesand Dr Bensimon's office notes   Assessment and Plan:  1.  Chronic systolic dysfunction euvolemic today Stable on an appropriate medical regimen Normal ICD function See Pace Art report No changes today  2.  Persistent atrial fibrillation Burden by device interrogation <1% Continue Xarelto for CHADS2VASC of 3 Continue Tikosyn QTc stable. BMET, Mg today  3.  Obesity Body mass index is 35.79 kg/m. Weight loss encouraged  4.  HTN Stable No change required today   Current medicines are reviewed at length with the patient today.   The patient does not have concerns regarding his medicines.  The following changes were made today:  none  Labs/ tests ordered today include: BMET, Mg Orders Placed This Encounter  Procedures  . Basic metabolic panel  . Magnesium  . CUP PACEART [place] . EKG 12-Lead     Disposition:   Follow up with Merlin, Dr BHaroldine Lawsas scheduled, me in 6 months      Signed, AChanetta Marshall NP 12/07/2017 9:02 AM  [facility] [address] [phone](office) ([phone](fax)

## 2017-12-07 NOTE — Patient Instructions (Addendum)
Medication Instructions:  NONE ORDERED  TODAY  If you need a refill on your cardiac medications before your next appointment, please call your pharmacy.   Lab work:   BMET AND MAG TODAY   If you have labs (blood work) drawn today and your tests are completely normal, you will receive your results only by: [name] MyChart Message (if you have MyChart) OR . A paper copy in the mail If you have any lab test that is abnormal or we need to change your treatment, we will call you to review the results.  Testing/Procedures: NONE ORDERED  TODAY   Follow-Up: At [facility], you and your health needs are our priority.  As part of our continuing mission to provide you with exceptional heart care, we have created designated Provider Care Teams.  These Care Teams include your primary Cardiologist (physician) and Advanced Practice Providers (APPs -  Physician Assistants and Nurse Practitioners) who all work together to provide you with the care you need, when you need it. You will need a follow up appointment in 6 months with Advanced Practice Provider [name], NP  Remote monitoring is used to monitor your Pacemaker of ICD from home. This monitoring reduces the number of office visits required to check your device to one time per year. It allows [place] to keep an eye on the functioning of your device to ensure it is working properly. You are scheduled for a device check from home on . 12-13-17 You may send your transmission at any time that day. If you have a wireless device, the transmission will be sent automatically. After your physician reviews your transmission, you will receive a postcard with your next transmission date.   Any Other Special Instructions Will Be Listed Below (If Applicable).

## 2017-12-07 NOTE — Telephone Encounter (Signed)
Pt dropped off Standard paperwork at front desk. Sent interoffice to [employer].

## 2017-12-12 ENCOUNTER — Telehealth: Payer: Self-pay

## 2017-12-12 ENCOUNTER — Telehealth: Payer: Self-pay | Admitting: Nurse Practitioner

## 2017-12-12 NOTE — Telephone Encounter (Signed)
-----   Message from Patsey Berthold, NP sent at 12/09/2017  5:31 PM EST ----- All values are normal or within acceptable limits.   Medication changes / Follow up labs / Other changes or recommendations:   none [name], NP 12/09/2017 5:31 PM

## 2017-12-12 NOTE — Telephone Encounter (Signed)
Notes recorded by Frederik Schmidt, RN on 12/12/2017 at 9:06 AM EST The patient has been notified of the result and verbalized understanding. All questions (if any) were answered. Frederik Schmidt, RN 12/12/2017 9:06 AM

## 2017-12-12 NOTE — Telephone Encounter (Signed)
New Message   Patient is calling to see if Aaron Thompson received a letter that he sent to her pertaining to his health. That is the only information that the patient would provider.

## 2017-12-12 NOTE — Telephone Encounter (Signed)
Spoke to the patient and informed him that we did receive his medical information.  It has been placed in Aaron Thompson's box and she will soon review.

## 2017-12-13 ENCOUNTER — Ambulatory Visit (INDEPENDENT_AMBULATORY_CARE_PROVIDER_SITE_OTHER): Payer: Medicare HMO

## 2017-12-13 ENCOUNTER — Telehealth: Payer: Self-pay

## 2017-12-13 DIAGNOSIS — I428 Other cardiomyopathies: Secondary | ICD-10-CM | POA: Diagnosis not present

## 2017-12-13 NOTE — Telephone Encounter (Signed)
Spoke with pt and reminded pt of remote transmission that is due today. Pt verbalized understanding.   

## 2017-12-13 NOTE — Telephone Encounter (Signed)
I'm back in the office next Monday and can fill out then.  [name], NP 12/13/2017 5:59 AM

## 2017-12-14 DIAGNOSIS — H52222 Regular astigmatism, left eye: Secondary | ICD-10-CM | POA: Diagnosis not present

## 2017-12-14 DIAGNOSIS — E119 Type 2 diabetes mellitus without complications: Secondary | ICD-10-CM | POA: Diagnosis not present

## 2017-12-14 DIAGNOSIS — H5212 Myopia, left eye: Secondary | ICD-10-CM | POA: Diagnosis not present

## 2017-12-14 LAB — HM DIABETES EYE EXAM

## 2017-12-15 NOTE — Progress Notes (Signed)
Remote ICD transmission.   

## 2017-12-23 ENCOUNTER — Other Ambulatory Visit: Payer: Self-pay | Admitting: Internal Medicine

## 2017-12-24 ENCOUNTER — Other Ambulatory Visit: Payer: Self-pay | Admitting: Cardiovascular Disease

## 2017-12-26 NOTE — Telephone Encounter (Signed)
Xarelto 20mg  refill request received; pt is 57 yrs old, wt-133.4kg, Crea-0.86 on 12/07/17, last seen by [name] on 12/07/17, CrCl-180.74ml/min; will send in refill to requested pharmacy.

## 2017-12-28 ENCOUNTER — Other Ambulatory Visit: Payer: Self-pay | Admitting: Cardiovascular Disease

## 2017-12-28 MED ORDER — POTASSIUM CHLORIDE CRYS ER 20 MEQ PO TBCR: 40.0000 meq | EXTENDED_RELEASE_TABLET | Freq: Two times a day (BID) | ORAL | 2 refills | Status: DC

## 2017-12-28 NOTE — Telephone Encounter (Signed)
Outpatient Medication Detail    Disp Refills Start End   potassium chloride SA (K-DUR,KLOR-CON) 20 MEQ tablet 360 tablet 2 12/28/2017    Sig - Route: Take 2 tablets (40 mEq total) by mouth 2 (two) times daily. - Oral   Sent to pharmacy as: potassium chloride SA (K-DUR,KLOR-CON) 20 MEQ tablet   E-Prescribing Status: Receipt confirmed by pharmacy (12/28/2017 8:36 AM EST)   Pharmacy   [facility] [place]

## 2018-01-02 DIAGNOSIS — H40013 Open angle with borderline findings, low risk, bilateral: Secondary | ICD-10-CM | POA: Diagnosis not present

## 2018-01-02 DIAGNOSIS — H53001 Unspecified amblyopia, right eye: Secondary | ICD-10-CM | POA: Diagnosis not present

## 2018-01-02 DIAGNOSIS — H25813 Combined forms of age-related cataract, bilateral: Secondary | ICD-10-CM | POA: Diagnosis not present

## 2018-01-05 ENCOUNTER — Telehealth: Payer: Self-pay | Admitting: Cardiology

## 2018-01-05 NOTE — Telephone Encounter (Signed)
LMOVM requesting that pt send manual transmission b/c home monitor has not updated in at least 7 days.    

## 2018-01-06 ENCOUNTER — Other Ambulatory Visit: Payer: Self-pay | Admitting: Cardiovascular Disease

## 2018-01-09 DIAGNOSIS — H25811 Combined forms of age-related cataract, right eye: Secondary | ICD-10-CM | POA: Diagnosis not present

## 2018-01-09 DIAGNOSIS — H2511 Age-related nuclear cataract, right eye: Secondary | ICD-10-CM | POA: Diagnosis not present

## 2018-01-10 ENCOUNTER — Encounter: Payer: Self-pay | Admitting: Family Medicine

## 2018-01-11 DIAGNOSIS — H2512 Age-related nuclear cataract, left eye: Secondary | ICD-10-CM | POA: Diagnosis not present

## 2018-01-12 ENCOUNTER — Encounter (HOSPITAL_COMMUNITY): Payer: Self-pay | Admitting: Internal Medicine

## 2018-01-12 ENCOUNTER — Ambulatory Visit (HOSPITAL_COMMUNITY)
Admission: RE | Admit: 2018-01-12 | Discharge: 2018-01-12 | Disposition: A | Discharge status: Home / Self Care | Payer: Medicare HMO | Source: Ambulatory Visit | Attending: Internal Medicine | Admitting: Internal Medicine

## 2018-01-12 VITALS — BP 102/80 | HR 82 | Wt 299.2 lb

## 2018-01-12 DIAGNOSIS — Z841 Family history of disorders of kidney and ureter: Secondary | ICD-10-CM | POA: Insufficient documentation

## 2018-01-12 DIAGNOSIS — H9190 Unspecified hearing loss, unspecified ear: Secondary | ICD-10-CM | POA: Insufficient documentation

## 2018-01-12 DIAGNOSIS — Z87891 Personal history of nicotine dependence: Secondary | ICD-10-CM | POA: Diagnosis not present

## 2018-01-12 DIAGNOSIS — Z95810 Presence of automatic (implantable) cardiac defibrillator: Secondary | ICD-10-CM | POA: Diagnosis not present

## 2018-01-12 DIAGNOSIS — E118 Type 2 diabetes mellitus with unspecified complications: Secondary | ICD-10-CM | POA: Diagnosis not present

## 2018-01-12 DIAGNOSIS — I480 Paroxysmal atrial fibrillation: Secondary | ICD-10-CM | POA: Diagnosis not present

## 2018-01-12 DIAGNOSIS — Z7901 Long term (current) use of anticoagulants: Secondary | ICD-10-CM | POA: Insufficient documentation

## 2018-01-12 DIAGNOSIS — Z87442 Personal history of urinary calculi: Secondary | ICD-10-CM | POA: Insufficient documentation

## 2018-01-12 DIAGNOSIS — Z79899 Other long term (current) drug therapy: Secondary | ICD-10-CM | POA: Diagnosis not present

## 2018-01-12 DIAGNOSIS — K219 Gastro-esophageal reflux disease without esophagitis: Secondary | ICD-10-CM | POA: Insufficient documentation

## 2018-01-12 DIAGNOSIS — E119 Type 2 diabetes mellitus without complications: Secondary | ICD-10-CM | POA: Insufficient documentation

## 2018-01-12 DIAGNOSIS — Z7984 Long term (current) use of oral hypoglycemic drugs: Secondary | ICD-10-CM | POA: Insufficient documentation

## 2018-01-12 DIAGNOSIS — I5022 Chronic systolic (congestive) heart failure: Secondary | ICD-10-CM | POA: Diagnosis not present

## 2018-01-12 DIAGNOSIS — Z833 Family history of diabetes mellitus: Secondary | ICD-10-CM | POA: Diagnosis not present

## 2018-01-12 DIAGNOSIS — Z8249 Family history of ischemic heart disease and other diseases of the circulatory system: Secondary | ICD-10-CM | POA: Insufficient documentation

## 2018-01-12 DIAGNOSIS — I428 Other cardiomyopathies: Secondary | ICD-10-CM | POA: Diagnosis not present

## 2018-01-12 DIAGNOSIS — I110 Hypertensive heart disease with heart failure: Secondary | ICD-10-CM | POA: Insufficient documentation

## 2018-01-12 DIAGNOSIS — I2510 Atherosclerotic heart disease of native coronary artery without angina pectoris: Secondary | ICD-10-CM | POA: Diagnosis not present

## 2018-01-12 DIAGNOSIS — G4733 Obstructive sleep apnea (adult) (pediatric): Secondary | ICD-10-CM | POA: Insufficient documentation

## 2018-01-12 NOTE — Patient Instructions (Addendum)
Your physician has requested that you have an echocardiogram. Echocardiography is a painless test that uses sound waves to create images of your heart. It provides your doctor with information about the size and shape of your heart and how well your heart's chambers and valves are working. This procedure takes approximately one hour. There are no restrictions for this procedure.  Please call in May to schedule your 6-9 month appointment.

## 2018-01-12 NOTE — Addendum Note (Signed)
Encounter addended by: Jolaine Artist, MD on: 01/12/2018 10:20 AM  Actions taken: Charge Capture section accepted

## 2018-01-12 NOTE — Addendum Note (Signed)
Encounter addended by: Valeda Malm, RN on: 01/12/2018 10:07 AM  Actions taken: Order list changed, Diagnosis association updated, Clinical Note Signed

## 2018-01-12 NOTE — Progress Notes (Signed)
ADVANCED HF CLINIC CONSULT NOTE  Referring Physician: Dr. [name]  Primary Cardiologist: Dr. [name]  HPI:  Aquan Kope Overmanis a 57 y.o.malewith history of PAF s/p AF ablation , HTN, morbid obesity, OSA unable to tolerate CPAP, DM, LBBB and chronic systolic HF (EF 47-65%) due to NICM s/p STJ CRT-D (cath 2013 minimal CAD)  referred by Dr. [name] for HF evaluation   Diagnosed with systolic HF in 4650. Cath at that time minimal CAD. Had STJ ICD placed.   Presented to ER with new-onset AF 7/17.Had DCCV in the ER. Admitted the same month with recurrent AF. Felt to to have both AFib and ATach. Subsequently seen by Dr. Rayann Heman and had afib ablation on 10/14/15. Subsequently h recurrent AF with RVR leading to ICD shocks. Placed back on tikosyn and anticoagulation. Now maintaining NSR on Tikosyn 500  Says he is feeling well. Working out with [employer]. Plays golf about 1x/week. Says he gets tired around 16-17th hole. Now on full-dose Entresto, carvedilol and spiro. Also on tolerating Jardiance well. Denies, CP, SOB, orthopnea or PND. No bleeding on Xarelto   ICD interrogation in clinic today. No VT/VF. No AF. BiV pacing 97% Volume looks great. Personally reviewed   Echo 12/14/16 EF 20-25%  CPX 8/13  FVC 5.14 (84%)    FEV1 3.93 (83%)     FEV1/FVC 76%     MVV 145 (83%)  Resting HR: 79 Peak HR: 171  % age predicted max HR: 100% BP rest: 109/77 BP peak: 194/76 (IPE)  Peak VO2: 24.5 % predicted peak VO2: 88.7% VE/VCO2 slope: 27.0 OUES: 3.10 Peak RER: 1.19 Ventilatory Threshold: 17.1 % predicted peak VO2: 61.9% VE/MVV: 74.5% O2pulse: 19  % predicted O2pulse: 90%   Past Medical History:  Diagnosis Date  . AICD (automatic cardioverter/defibrillator) present   . Anxiety state, unspecified   . Calculus of kidney 1981   "passed it on my own"  . Chronic systolic CHF (congestive heart failure) ([place])   . Dermatophytosis of the body   . DJD (degenerative joint disease)     . Esophageal reflux   . Hx of colonic polyps   . Hypertension   . LBBB (left bundle branch block)    intermittent  . Non-ischemic cardiomyopathy ([place])    a. cath 2013: minor nonobstructive CAD.  . OSA on CPAP    pt can't use it.  . Osteoarthrosis, unspecified whether generalized or localized, hand   . Other chronic nonalcoholic liver disease    fatty liver  . PAF (paroxysmal atrial fibrillation) ([place])    a. s/p DCCV 6/11; previously on Pradaxa;  b. Event Monitor 2012->No PAF;  c. 09/2011 s/p DCCV ->Xarelto started. d. 03/2014: inappropriate ICD shocks for AF-RVR, started on Tikosyn, Xarelto restarted.  . Type II diabetes mellitus ([place])   . Unspecified hearing loss    no hearing aid    Current Outpatient Medications  Medication Sig Dispense Refill  . carvedilol (COREG) 25 MG tablet Take 2 tablets (50 mg total) by mouth 2 (two) times daily with a meal. 120 tablet 11  . cetirizine (ZYRTEC) 10 MG tablet Take 10 mg by mouth daily as needed for allergies.    [name] dofetilide (TIKOSYN) 500 MCG capsule TAKE 1 CAPSULE(500 MCG) BY MOUTH TWICE DAILY 180 capsule 3  . empagliflozin (JARDIANCE) 10 MG TABS tablet Take 10 mg by mouth daily. 30 tablet 11  . glipiZIDE (GLIPIZIDE XL) 10 MG 24 hr tablet Take 1 tablet (10 mg total) by mouth daily  with breakfast. 30 tablet 11  . glucose blood (ONE TOUCH ULTRA TEST) test strip CHECK GLUCOSE TWICE DAILY AND AS NEEDED, DX. E11.65 (UNCONTROLLED DM) 100 each 5  . metFORMIN (GLUCOPHAGE-XR) 500 MG 24 hr tablet TAKE 3 TABLETS(1500 MG) BY MOUTH DAILY WITH BREAKFAST 90 tablet 0  . Multiple Vitamin (MULTIVITAMIN) tablet Take 1 tablet by mouth daily.      [name] Bicarbonate (ZEGERID OTC) 20-1100 MG CAPS Take 1 capsule by mouth 2 (two) times daily.     . potassium chloride SA (K-DUR,KLOR-CON) 20 MEQ tablet Take 2 tablets (40 mEq total) by mouth 2 (two) times daily. 360 tablet 2  . sacubitril-valsartan (ENTRESTO) 97-103 MG Take 1 tablet by mouth 2 (two) times  daily. 60 tablet 11  . spironolactone (ALDACTONE) 25 MG tablet Take 1 tablet (25 mg total) by mouth daily. 30 tablet 11  . traMADol (ULTRAM) 50 MG tablet TAKE 1 TABLET BY MOUTH TWICE DAILY 60 tablet 0  . XARELTO 20 MG TABS tablet TAKE 1 TABLET(20 MG) BY MOUTH DAILY WITH SUPPER 30 tablet 5   No current facility-administered medications for this encounter.     No Known Allergies    Social History   Socioeconomic History  . Marital status: Married    Spouse name: Not on file  . Number of children: 2  . Years of education: Not on file  . Highest education level: Not on file  Occupational History  . Occupation: Banker: UNEMPLOYED  Social Needs  . Financial resource strain: Not on file  . Food insecurity:    Worry: Not on file    Inability: Not on file  . Transportation needs:    Medical: Not on file    Non-medical: Not on file  Tobacco Use  . Smoking status: Never Smoker  . Smokeless tobacco: Former Systems developer    Types: Snuff  . Tobacco comment: 02/14/2013 "quit snuff in 2006"  Substance and Sexual Activity  . Alcohol use: No    Alcohol/week: 0.0 standard drinks  . Drug use: No  . Sexual activity: Yes  Lifestyle  . Physical activity:    Days per week: Not on file    Minutes per session: Not on file  . Stress: Not on file  Relationships  . Social connections:    Talks on phone: Not on file    Gets together: Not on file    Attends religious service: Not on file    Active member of club or organization: Not on file    Attends meetings of clubs or organizations: Not on file    Relationship status: Not on file  . Intimate partner violence:    Fear of current or ex partner: Not on file    Emotionally abused: Not on file    Physically abused: Not on file    Forced sexual activity: Not on file  Other Topics Concern  . Not on file  Social History Narrative   Lives in [place] with wife.  Works @ [employer] in [employer].  Not routinely exercising.        Family History  Problem Relation Age of Onset  . Hypertension Mother   . Diabetes Mother   . Heart failure Mother        Died @ 74  . Hypertension Father   . Heart failure Father        Died @ 68  . Prostate cancer Father   . Diabetes Brother   .  Kidney disease Brother   . Diabetes Brother   . Prostate cancer Brother   . Diabetes Brother   . Other Sister   . Heart attack Paternal Grandfather   . Stroke Paternal Grandmother   . Colon cancer Paternal Grandmother   . Multiple myeloma Sister   . Leukemia Sister   . Colon polyps Sister   . Cirrhosis Maternal Uncle        alcohol related  . Esophageal cancer Neg Hx   . Stomach cancer Neg Hx   . Pancreatic cancer Neg Hx     Vitals:   01/12/18 0934  BP: 102/80  Pulse: 82  SpO2: 98%  Weight: 135.7 kg (299 lb 3.2 oz)    PHYSICAL EXAM: General:  Well appearing. No resp difficulty HEENT: normal Neck: supple. no JVD. Carotids 2+ bilat; no bruits. No lymphadenopathy or thryomegaly appreciated. Cor: PMI nondisplaced. Regular rate & rhythm. No rubs, gallops or murmurs. Lungs: clear Abdomen: soft, nontender, nondistended. No hepatosplenomegaly. No bruits or masses. Good bowel sounds. Extremities: no cyanosis, clubbing, rash, edema Neuro: alert & orientedx3, cranial nerves grossly intact. moves all 4 extremities w/o difficulty. Affect pleasant  ASSESSMENT & PLAN: 1. Chronic systolic failure due to NICM s/p STJ CRT-D - cath 2013 non-obstructive CAD - Echo 11/18 EF 20-25%  - Much improved NYHA I-II - ICD interrogation ok - Volume status ok. Reinforced need for daily weights and reviewed use of sliding scale diuretics. - On goal-dose HF meds: Entresto 97/103 bid, carvedilol 25 bid and spiro 25 daily. + Jardiance 2m daily  - Will see q6 months. If functional status declines will need repeat CPX - Repeat echo  - Check labs  2. PAF - s/p AF ablation - maintaining NSR on Tikosyn. [facility] by [facility] - No  bleeding on Xarelto  3. OSA - unable to tolerate CPAP - weight loss recommended.   4. DM2  - Continue Jardiance 10 - continue statin and EJeanie Sewer MD  9:56 AM    .

## 2018-01-23 DIAGNOSIS — H2512 Age-related nuclear cataract, left eye: Secondary | ICD-10-CM | POA: Diagnosis not present

## 2018-01-23 DIAGNOSIS — H25812 Combined forms of age-related cataract, left eye: Secondary | ICD-10-CM | POA: Diagnosis not present

## 2018-01-27 ENCOUNTER — Ambulatory Visit (HOSPITAL_COMMUNITY): Payer: Medicare Other

## 2018-02-02 ENCOUNTER — Ambulatory Visit (HOSPITAL_COMMUNITY)
Admission: RE | Admit: 2018-02-02 | Discharge: 2018-02-02 | Disposition: A | Discharge status: Home / Self Care | Payer: Medicare Other | Source: Ambulatory Visit | Attending: Internal Medicine | Admitting: Internal Medicine

## 2018-02-02 DIAGNOSIS — G4733 Obstructive sleep apnea (adult) (pediatric): Secondary | ICD-10-CM | POA: Insufficient documentation

## 2018-02-02 DIAGNOSIS — E785 Hyperlipidemia, unspecified: Secondary | ICD-10-CM | POA: Insufficient documentation

## 2018-02-02 DIAGNOSIS — I255 Ischemic cardiomyopathy: Secondary | ICD-10-CM | POA: Diagnosis not present

## 2018-02-02 DIAGNOSIS — I5022 Chronic systolic (congestive) heart failure: Secondary | ICD-10-CM | POA: Insufficient documentation

## 2018-02-02 DIAGNOSIS — E119 Type 2 diabetes mellitus without complications: Secondary | ICD-10-CM | POA: Diagnosis not present

## 2018-02-02 DIAGNOSIS — Z95810 Presence of automatic (implantable) cardiac defibrillator: Secondary | ICD-10-CM | POA: Insufficient documentation

## 2018-02-02 DIAGNOSIS — I4891 Unspecified atrial fibrillation: Secondary | ICD-10-CM | POA: Insufficient documentation

## 2018-02-02 DIAGNOSIS — I110 Hypertensive heart disease with heart failure: Secondary | ICD-10-CM | POA: Diagnosis not present

## 2018-02-02 NOTE — Progress Notes (Signed)
  Echocardiogram 2D Echocardiogram has been performed.  Aaron Thompson 02/02/2018, 11:05 AM

## 2018-02-06 ENCOUNTER — Other Ambulatory Visit: Payer: Self-pay | Admitting: Cardiovascular Disease

## 2018-02-06 NOTE — Telephone Encounter (Signed)
Pt's pharmacy is requesting a refill on tramadol. Would Dr. [name] like to refill this medication? Please address

## 2018-02-08 LAB — CUP PACEART REMOTE DEVICE CHECK
Battery Remaining Longevity: 19 mo
Battery Remaining Percentage: 28 %
Battery Voltage: 2.84 V
Brady Statistic AP VP Percent: 1.5 %
Brady Statistic AP VS Percent: 1 %
Brady Statistic AS VS Percent: 1 %
Brady Statistic RA Percent Paced: 1 %
Date Time Interrogation Session: 20191119174320
HighPow Impedance: 93 Ohm
HighPow Impedance: 93 Ohm
Implantable Lead Implant Date: 20150121
Implantable Lead Implant Date: 20150121
Implantable Lead Implant Date: 20150121
Implantable Lead Location: 753858
Implantable Lead Model: 7122
Implantable Pulse Generator Implant Date: 20150121
Lead Channel Impedance Value: 460 Ohm
Lead Channel Impedance Value: 530 Ohm
Lead Channel Pacing Threshold Amplitude: 0.75 V
Lead Channel Pacing Threshold Amplitude: 1.125 V
Lead Channel Pacing Threshold Amplitude: 2 V
Lead Channel Pacing Threshold Pulse Width: 0.5 ms
Lead Channel Pacing Threshold Pulse Width: 0.5 ms
Lead Channel Pacing Threshold Pulse Width: 0.8 ms
Lead Channel Sensing Intrinsic Amplitude: 11.8 mV
Lead Channel Sensing Intrinsic Amplitude: 4.9 mV
Lead Channel Setting Pacing Amplitude: 1.75 V
Lead Channel Setting Pacing Amplitude: 2.125
Lead Channel Setting Pacing Amplitude: 3.5 V
Lead Channel Setting Pacing Pulse Width: 0.8 ms
Lead Channel Setting Sensing Sensitivity: 0.5 mV
MDC IDC LEAD LOCATION: 753859
MDC IDC LEAD LOCATION: 753860
MDC IDC MSMT LEADCHNL LV IMPEDANCE VALUE: 910 Ohm
MDC IDC SET LEADCHNL RV PACING PULSEWIDTH: 0.5 ms
MDC IDC STAT BRADY AS VP PERCENT: 96 %
Pulse Gen Serial Number: 7133975

## 2018-03-07 ENCOUNTER — Other Ambulatory Visit: Payer: Self-pay | Admitting: Cardiovascular Disease

## 2018-03-08 ENCOUNTER — Other Ambulatory Visit: Payer: Self-pay | Admitting: Cardiovascular Disease

## 2018-03-15 ENCOUNTER — Telehealth: Payer: Self-pay

## 2018-03-15 NOTE — Telephone Encounter (Signed)
Left message for patient to remind of missed remote transmission.  

## 2018-03-20 ENCOUNTER — Ambulatory Visit (INDEPENDENT_AMBULATORY_CARE_PROVIDER_SITE_OTHER): Payer: Medicare Other | Admitting: *Deleted

## 2018-03-20 DIAGNOSIS — I428 Other cardiomyopathies: Secondary | ICD-10-CM

## 2018-03-21 NOTE — Progress Notes (Deleted)
Dr. Frederico Hamman T. Laramie Meissner, MD, [facility] Primary [facility] and Sports Medicine [place], [address] Phone: [phone] Fax: [phone]  03/22/2018  Patient: Aaron Thompson, MRN: [id number], DOB: [birth year], 58 y.o.  Primary Physician:  Tower, Wynelle Fanny, MD   No chief complaint on file.  Subjective:   ASEEL UHDE is a 58 y.o. very pleasant male patient who presents with the following:  Patient of Dr. Alba Cory with reasonably complicated medical history including congestive heart failure, followed in the congestive heart failure clinic by Dr. Haroldine Laws.  He is on Xarelto chronically.  He also has an implanted defibrillator.  Prior history of neck fusion.    Past Medical History, Surgical History, Social History, Family History, Problem List, Medications, and Allergies have been reviewed and updated if relevant.  Patient Active Problem List   Diagnosis Date Noted  . History of colonic polyps   . Lower back pain 09/07/2016  . Family history of prostate cancer 06/23/2016  . Frequent urination 06/23/2016  . Encounter for hepatitis C screening test for low risk patient 06/23/2016  . Encounter for screening for HIV 06/23/2016  . Colon polyps 06/23/2016  . Laboratory examination ordered as part of a routine general medical examination 03/08/2016  . Routine general medical examination at a health care facility 03/08/2016  . Prostate cancer screening 03/08/2016  . A-fib ([place]) 10/14/2015  . Hypokalemia 08/12/2015  . Inappropriate shocks from ICD (implantable cardioverter-defibrillator) 03/17/2015  . Hearing loss of right ear due to cerumen impaction 03/13/2015  . Obesity 12/05/2014  . Osteoarthritis of both hands 07/31/2014  . Low serum potassium level 04/21/2014  . ICD (implantable cardioverter-defibrillator) discharge 04/19/2014  . Chronic systolic heart failure ([place]) 02/14/2013  . Hyperlipidemia 02/05/2013  . Diabetes type 2, uncontrolled ([place]) 09/30/2011    . Nonischemic cardiomyopathy ([place]) 08/18/2011  . Chest pain on exertion 08/17/2011  . Palpitations 05/12/2010  . CARDIOMYOPATHY OTHER DISEASES CLASSIFIED ELSW 07/16/2009  . OSTEOARTHROSIS UNSPEC WHETHER GEN/LOCALIZED HAND 08/16/2008  . Essential hypertension 05/11/2007  . History of renal calculi 05/11/2007  . Fatty liver 03/20/2007  . ANXIETY 10/04/2006  . HEARING IMPAIRMENT 10/04/2006  . FOLLICULITIS 67/61/9509  . GERD 09/20/2006    Past Medical History:  Diagnosis Date  . AICD (automatic cardioverter/defibrillator) present   . Anxiety state, unspecified   . Calculus of kidney 1981   "passed it on my own"  . Chronic systolic CHF (congestive heart failure) ([place])   . Dermatophytosis of the body   . DJD (degenerative joint disease)   . Esophageal reflux   . Hx of colonic polyps   . Hypertension   . LBBB (left bundle branch block)    intermittent  . Non-ischemic cardiomyopathy ([place])    a. cath 2013: minor nonobstructive CAD.  . OSA on CPAP    pt can't use it.  . Osteoarthrosis, unspecified whether generalized or localized, hand   . Other chronic nonalcoholic liver disease    fatty liver  . PAF (paroxysmal atrial fibrillation) ([place])    a. s/p DCCV 6/11; previously on Pradaxa;  b. Event Monitor 2012->No PAF;  c. 09/2011 s/p DCCV ->Xarelto started. d. 03/2014: inappropriate ICD shocks for AF-RVR, started on Tikosyn, Xarelto restarted.  . Type II diabetes mellitus ([place])   . Unspecified hearing loss    no hearing aid    Past Surgical History:  Procedure Laterality Date  . Abdominal US  01/2007   Fatty liver, no gallstones  . APPENDECTOMY    .  ATRIAL TACH ABLATION  09/2015  . BI-VENTRICULAR IMPLANTABLE CARDIOVERTER DEFIBRILLATOR [name] 02/14/2013   STJ CRTD implanted by Dr Caryl Comes  . BILATERAL KNEE ARTHROSCOPY    . CARDIOVERSION  09/29/2011   Procedure: CARDIOVERSION;  Surgeon: Thayer Headings, MD;  Location: [facility];  Service: Cardiovascular;  Laterality: [name];  . COLONOSCOPY WITH  PROPOFOL [name] 10/26/2016   Procedure: COLONOSCOPY WITH PROPOFOL;  Surgeon: Irene Shipper, MD;  [facility];  Service: Endoscopy;  Laterality: [name];  . DOPPLER ECHOCARDIOGRAPHY  11/2009   Decreased EF of 35-40%  . ELECTROPHYSIOLOGIC STUDY [name] 10/14/2015   Procedure: Atrial Fibrillation Ablation;  Surgeon: [name], MD;  Location: [facility];  Service: Cardiovascular;  Laterality: [name];  . LEFT HEART CATHETERIZATION WITH CORONARY ANGIOGRAM [name] 08/18/2011   Procedure: LEFT HEART CATHETERIZATION WITH CORONARY ANGIOGRAM;  Surgeon: Peter M Martinique, MD;  Location: [facility] C[facility] LAB;  Service: Cardiovascular;  Laterality: [name];  . NECK SURGERY     plate/fusion  . Stress Cardiolite  08/2005   Normal, EF 42%  . UPPER GASTROINTESTINAL ENDOSCOPY  08/2006   GERD    Social History   Socioeconomic History  . Marital status: Married    Spouse name: Not on file  . Number of children: 2  . Years of education: Not on file  . Highest education level: Not on file  Occupational History  . Occupation: Banker: UNEMPLOYED  Social Needs  . Financial resource strain: Not on file  . Food insecurity:    Worry: Not on file    Inability: Not on file  . Transportation needs:    Medical: Not on file    Non-medical: Not on file  Tobacco Use  . Smoking status: Never Smoker  . Smokeless tobacco: Former Systems developer    Types: Snuff  . Tobacco comment: 02/14/2013 "quit snuff in 2006"  Substance and Sexual Activity  . Alcohol use: No    Alcohol/week: 0.0 standard drinks  . Drug use: No  . Sexual activity: Yes  Lifestyle  . Physical activity:    Days per week: Not on file    Minutes per session: Not on file  . Stress: Not on file  Relationships  . Social connections:    Talks on phone: Not on file    Gets together: Not on file    Attends religious service: Not on file    Active member of club or organization: Not on file    Attends meetings of clubs or organizations: Not on file     Relationship status: Not on file  . Intimate partner violence:    Fear of current or ex partner: Not on file    Emotionally abused: Not on file    Physically abused: Not on file    Forced sexual activity: Not on file  Other Topics Concern  . Not on file  Social History Narrative   Lives in [place] with wife.  Works @ [employer] in [employer].  Not routinely exercising.    Family History  Problem Relation Age of Onset  . Hypertension Mother   . Diabetes Mother   . Heart failure Mother        Died @ 97  . Hypertension Father   . Heart failure Father        Died @ 23  . Prostate cancer Father   . Diabetes Brother   . Kidney disease Brother   . Diabetes Brother   . Prostate cancer Brother   .  Diabetes Brother   . Other Sister   . Heart attack Paternal Grandfather   . Stroke Paternal Grandmother   . Colon cancer Paternal Grandmother   . Multiple myeloma Sister   . Leukemia Sister   . Colon polyps Sister   . Cirrhosis Maternal Uncle        alcohol related  . Esophageal cancer Neg Hx   . Stomach cancer Neg Hx   . Pancreatic cancer Neg Hx     No Known Allergies  Medication list reviewed and updated in full in [facility].  GEN: No fevers, chills. Nontoxic. Primarily MSK c/o today. MSK: Detailed in the HPI GI: tolerating PO intake without difficulty Neuro: No numbness, parasthesias, or tingling associated. Otherwise the pertinent positives of the ROS are noted above.   Objective:   There were no vitals taken for this visit.  ***  Radiology: No results found.  Assessment and Plan:   ***

## 2018-03-22 ENCOUNTER — Ambulatory Visit: Payer: Medicare Other | Admitting: Family Medicine

## 2018-03-25 LAB — CUP PACEART REMOTE DEVICE CHECK
Battery Remaining Longevity: 17 mo
Battery Remaining Percentage: 24 %
Battery Voltage: 2.8 V
Brady Statistic AP VP Percent: 1 %
Brady Statistic AP VS Percent: 1 %
Brady Statistic AS VP Percent: 98 %
Brady Statistic RA Percent Paced: 1 %
Date Time Interrogation Session: 20200224205728
HIGH POWER IMPEDANCE MEASURED VALUE: 93 Ohm
HighPow Impedance: 93 Ohm
Implantable Lead Implant Date: 20150121
Implantable Lead Implant Date: 20150121
Implantable Lead Location: 753858
Implantable Lead Location: 753859
Implantable Lead Location: 753860
Implantable Lead Model: 7122
Implantable Pulse Generator Implant Date: 20150121
Lead Channel Impedance Value: 590 Ohm
Lead Channel Impedance Value: 990 Ohm
Lead Channel Pacing Threshold Amplitude: 0.75 V
Lead Channel Pacing Threshold Amplitude: 1.125 V
Lead Channel Pacing Threshold Amplitude: 2 V
Lead Channel Pacing Threshold Pulse Width: 0.5 ms
Lead Channel Pacing Threshold Pulse Width: 0.5 ms
Lead Channel Pacing Threshold Pulse Width: 0.8 ms
Lead Channel Sensing Intrinsic Amplitude: 12 mV
Lead Channel Setting Pacing Amplitude: 1.75 V
Lead Channel Setting Pacing Amplitude: 2.125
Lead Channel Setting Pacing Amplitude: 3.5 V
Lead Channel Setting Pacing Pulse Width: 0.5 ms
Lead Channel Setting Pacing Pulse Width: 0.8 ms
Lead Channel Setting Sensing Sensitivity: 0.5 mV
MDC IDC LEAD IMPLANT DT: 20150121
MDC IDC MSMT LEADCHNL RA IMPEDANCE VALUE: 490 Ohm
MDC IDC MSMT LEADCHNL RA SENSING INTR AMPL: 5 mV
MDC IDC STAT BRADY AS VS PERCENT: 1 %
Pulse Gen Serial Number: 7133975

## 2018-03-26 NOTE — Progress Notes (Deleted)
Dr. Frederico Hamman T. Taelor Moncada, MD, [facility] Primary [facility] and Sports Medicine [place], [address] Phone: [phone] Fax: [phone]  03/29/2018  Patient: Aaron Thompson, MRN: [id number], DOB: 11-30-1960, 58 y.o.  Primary Physician:  Tower, Wynelle Fanny, MD   No chief complaint on file.  Subjective:   Aaron Thompson is a 58 y.o. very pleasant male patient who presents with the following:  Aaron Thompson is a well-known patient, and he presents with some ongoing back pain.  Significant medical comorbidities include severe congestive heart failure, AICD placement, as well as diabetes mellitus. Chronic Xarelto.    Past Medical History, Surgical History, Social History, Family History, Problem List, Medications, and Allergies have been reviewed and updated if relevant.  Patient Active Problem List   Diagnosis Date Noted  . History of colonic polyps   . Lower back pain 09/07/2016  . Family history of prostate cancer 06/23/2016  . Frequent urination 06/23/2016  . Encounter for hepatitis C screening test for low risk patient 06/23/2016  . Encounter for screening for HIV 06/23/2016  . Colon polyps 06/23/2016  . Laboratory examination ordered as part of a routine general medical examination 03/08/2016  . Routine general medical examination at a health care facility 03/08/2016  . Prostate cancer screening 03/08/2016  . A-fib ([place]) 10/14/2015  . Hypokalemia 08/12/2015  . Inappropriate shocks from ICD (implantable cardioverter-defibrillator) 03/17/2015  . Hearing loss of right ear due to cerumen impaction 03/13/2015  . Obesity 12/05/2014  . Osteoarthritis of both hands 07/31/2014  . Low serum potassium level 04/21/2014  . ICD (implantable cardioverter-defibrillator) discharge 04/19/2014  . Chronic systolic heart failure ([place]) 02/14/2013  . Hyperlipidemia 02/05/2013  . Diabetes type 2, uncontrolled ([place]) 09/30/2011  . Nonischemic cardiomyopathy ([place]) 08/18/2011  . Chest  pain on exertion 08/17/2011  . Palpitations 05/12/2010  . CARDIOMYOPATHY OTHER DISEASES CLASSIFIED ELSW 07/16/2009  . OSTEOARTHROSIS UNSPEC WHETHER GEN/LOCALIZED HAND 08/16/2008  . Essential hypertension 05/11/2007  . History of renal calculi 05/11/2007  . Fatty liver 03/20/2007  . ANXIETY 10/04/2006  . HEARING IMPAIRMENT 10/04/2006  . FOLLICULITIS 02/77/4128  . GERD 09/20/2006    Past Medical History:  Diagnosis Date  . AICD (automatic cardioverter/defibrillator) present   . Anxiety state, unspecified   . Calculus of kidney 1981   "passed it on my own"  . Chronic systolic CHF (congestive heart failure) ([place])   . Dermatophytosis of the body   . DJD (degenerative joint disease)   . Esophageal reflux   . Hx of colonic polyps   . Hypertension   . LBBB (left bundle branch block)    intermittent  . Non-ischemic cardiomyopathy ([place])    a. cath 2013: minor nonobstructive CAD.  . OSA on CPAP    pt can't use it.  . Osteoarthrosis, unspecified whether generalized or localized, hand   . Other chronic nonalcoholic liver disease    fatty liver  . PAF (paroxysmal atrial fibrillation) ([place])    a. s/p DCCV 6/11; previously on Pradaxa;  b. Event Monitor 2012->No PAF;  c. 09/2011 s/p DCCV ->Xarelto started. d. 03/2014: inappropriate ICD shocks for AF-RVR, started on Tikosyn, Xarelto restarted.  . Type II diabetes mellitus ([place])   . Unspecified hearing loss    no hearing aid    Past Surgical History:  Procedure Laterality Date  . Abdominal US  01/2007   Fatty liver, no gallstones  . APPENDECTOMY    . ATRIAL TACH ABLATION  09/2015  . BI-VENTRICULAR IMPLANTABLE  CARDIOVERTER DEFIBRILLATOR [name] 02/14/2013   STJ CRTD implanted by Dr Caryl Comes  . BILATERAL KNEE ARTHROSCOPY    . CARDIOVERSION  09/29/2011   Procedure: CARDIOVERSION;  Surgeon: Thayer Headings, MD;  Location: [facility];  Service: Cardiovascular;  Laterality: [name];  . COLONOSCOPY WITH PROPOFOL [name] 10/26/2016   Procedure: COLONOSCOPY WITH  PROPOFOL;  Surgeon: Irene Shipper, MD;  [facility];  Service: Endoscopy;  Laterality: [name];  . DOPPLER ECHOCARDIOGRAPHY  11/2009   Decreased EF of 35-40%  . ELECTROPHYSIOLOGIC STUDY [name] 10/14/2015   Procedure: Atrial Fibrillation Ablation;  Surgeon: [name], MD;  Location: [facility];  Service: Cardiovascular;  Laterality: [name];  . LEFT HEART CATHETERIZATION WITH CORONARY ANGIOGRAM [name] 08/18/2011   Procedure: LEFT HEART CATHETERIZATION WITH CORONARY ANGIOGRAM;  Surgeon: Peter M Martinique, MD;  Location: [facility] CATH LAB;  Service: Cardiovascular;  Laterality: [name];  . NECK SURGERY     plate/fusion  . Stress Cardiolite  08/2005   Normal, EF 42%  . UPPER GASTROINTESTINAL ENDOSCOPY  08/2006   GERD    Social History   Socioeconomic History  . Marital status: Married    Spouse name: Not on file  . Number of children: 2  . Years of education: Not on file  . Highest education level: Not on file  Occupational History  . Occupation: Banker: UNEMPLOYED  Social Needs  . Financial resource strain: Not on file  . Food insecurity:    Worry: Not on file    Inability: Not on file  . Transportation needs:    Medical: Not on file    Non-medical: Not on file  Tobacco Use  . Smoking status: Never Smoker  . Smokeless tobacco: Former Systems developer    Types: Snuff  . Tobacco comment: 02/14/2013 "quit snuff in 2006"  Substance and Sexual Activity  . Alcohol use: No    Alcohol/week: 0.0 standard drinks  . Drug use: No  . Sexual activity: Yes  Lifestyle  . Physical activity:    Days per week: Not on file    Minutes per session: Not on file  . Stress: Not on file  Relationships  . Social connections:    Talks on phone: Not on file    Gets together: Not on file    Attends religious service: Not on file    Active member of club or organization: Not on file    Attends meetings of clubs or organizations: Not on file    Relationship status: Not on file  . Intimate partner  violence:    Fear of current or ex partner: Not on file    Emotionally abused: Not on file    Physically abused: Not on file    Forced sexual activity: Not on file  Other Topics Concern  . Not on file  Social History Narrative   Lives in [place] with wife.  Works @ [employer] in [employer].  Not routinely exercising.    Family History  Problem Relation Age of Onset  . Hypertension Mother   . Diabetes Mother   . Heart failure Mother        Died @ 29  . Hypertension Father   . Heart failure Father        Died @ 53  . Prostate cancer Father   . Diabetes Brother   . Kidney disease Brother   . Diabetes Brother   . Prostate cancer Brother   . Diabetes Brother   . Other  Sister   . Heart attack Paternal Grandfather   . Stroke Paternal Grandmother   . Colon cancer Paternal Grandmother   . Multiple myeloma Sister   . Leukemia Sister   . Colon polyps Sister   . Cirrhosis Maternal Uncle        alcohol related  . Esophageal cancer Neg Hx   . Stomach cancer Neg Hx   . Pancreatic cancer Neg Hx     No Known Allergies  Medication list reviewed and updated in full in [facility].  GEN: No fevers, chills. Nontoxic. Primarily MSK c/o today. MSK: Detailed in the HPI GI: tolerating PO intake without difficulty Neuro: No numbness, parasthesias, or tingling associated. Otherwise the pertinent positives of the ROS are noted above.   Objective:   There were no vitals taken for this visit.  ***  Radiology: No results found.  Assessment and Plan:   ***

## 2018-03-28 ENCOUNTER — Encounter: Payer: Self-pay | Admitting: Cardiology

## 2018-03-28 NOTE — Progress Notes (Signed)
Remote ICD transmission.   

## 2018-03-29 ENCOUNTER — Ambulatory Visit: Payer: Medicare Other | Admitting: Family Medicine

## 2018-04-05 ENCOUNTER — Other Ambulatory Visit: Payer: Self-pay | Admitting: Cardiovascular Disease

## 2018-04-06 ENCOUNTER — Other Ambulatory Visit: Payer: Self-pay | Admitting: Cardiovascular Disease

## 2018-04-09 ENCOUNTER — Other Ambulatory Visit: Payer: Self-pay | Admitting: Cardiovascular Disease

## 2018-04-13 ENCOUNTER — Ambulatory Visit: Payer: PPO | Admitting: Cardiovascular Disease

## 2018-04-15 ENCOUNTER — Other Ambulatory Visit: Payer: Self-pay | Admitting: Cardiovascular Disease

## 2018-04-18 ENCOUNTER — Telehealth: Payer: Self-pay

## 2018-04-18 NOTE — Telephone Encounter (Signed)
**Note De-Identified Nahsir Venezia Obfuscation** I have done an [place] PA through covermymeds. Key: [id number]

## 2018-04-19 ENCOUNTER — Other Ambulatory Visit: Payer: Self-pay

## 2018-04-20 ENCOUNTER — Other Ambulatory Visit: Payer: Self-pay

## 2018-04-20 MED ORDER — SACUBITRIL-VALSARTAN 97-103 MG PO TABS: 1.0000 | ORAL_TABLET | Freq: Two times a day (BID) | ORAL | 2 refills | Status: DC

## 2018-04-21 NOTE — Telephone Encounter (Signed)
**Note De-Identified Harlin Mazzoni Obfuscation** I did a Ent[place] PA on 3/25 through covermymeds and received an approval: Rennie Natter (Key: [facility])  Rx #: [id number]  Entresto 97-103MG  tablets  Form [employer] Medicare 4-Part NCPDP Electronic PA Form  Created  4 days ago  Sent to Plan  3 days ago  Plan Response  3 days ago  Determination  Favorable  1 day ago    Message from Plan PA Case: [id number], Status: Approved, Coverage Starts on: 04/19/2018 12:00:00 AM, Coverage Ends on: 04/19/2019 12:00:00 AM.   I called [facility] and was advised that the pt picked up his Entresto refill yesterday and that the reason it cost so much is because the pt is in the donut hole. I will call the pt to discuss options.

## 2018-04-21 NOTE — Telephone Encounter (Signed)
**Note De-Identified Dietrich Samuelson Obfuscation** The following message was received from covermymeds: Rennie Natter (Key: [facility])  Rx #: [id number]  Entresto 97-103MG  tablets  Form [employer] Medicare 4-Part NCPDP Electronic PA Form  Created  4 days ago  Sent to Plan  3 days ago  Plan Response  3 days ago  Submit Clinical Questions  3 days ago  Determination  Favorable  1 day ago    Message from Plan PA Case: [id number], Status: Approved, Coverage Starts on: 04/19/2018 12:00:00 AM, Coverage Ends on: 04/19/2019 12:00:00 AM.  I have notified the pts pharmacy of this approval. Per pharmacy the pt is in the "donut hole". I will call the pt to discuss cost and options for getting cost lowered.

## 2018-04-21 NOTE — Telephone Encounter (Signed)
**Note De-identified Aaron Thompson Obfuscation** I left a message on the pts VM asking him to call me back. 

## 2018-04-25 NOTE — Telephone Encounter (Signed)
LMTCB

## 2018-04-26 ENCOUNTER — Telehealth: Payer: Self-pay

## 2018-04-26 NOTE — Telephone Encounter (Signed)
Patient agreed to telephone visit only.  YOUR CARDIOLOGY TEAM HAS ARRANGED FOR AN E-VISIT FOR YOUR APPOINTMENT - PLEASE REVIEW IMPORTANT INFORMATION BELOW SEVERAL DAYS PRIOR TO YOUR APPOINTMENT  Due to the recent COVID-19 pandemic, we are transitioning in-person office visits to tele-medicine visits in an effort to decrease unnecessary exposure to our patients and staff. Medicare and most insurances are covering these visits without a copay needed. We also encourage you to sign up for MyChart if you have not already done so. You will need a smartphone if possible. For patients that do not have this, we can still complete the visit using a regular telephone but do prefer a smartphone to enable video when possible. You may have a close family member that lives with you that can help. If possible, we also ask that you have a blood pressure cuff and scale at home to measure your blood pressure, heart rate and weight prior to your scheduled appointment. Patients with clinical needs that need an in-person evaluation and testing will still be able to come to the office if absolutely necessary. If you have any questions, feel free to call our office.    IF YOU HAVE A SMARTPHONE, PLEASE DOWNLOAD THE WEBEX APP TO YOUR SMARTPHONE  - If Apple, go to [employer] and type in WebEx in the search bar. [place] [employer], the blue/green circle. The app is free but as with any other app download, your phone may require you to verify saved payment information or Apple password. You do NOT have to create a WebEx account.  - If Android, go to [employer] and type in [employer] in the search bar. [place] [employer], the blue/green circle. The app is free but as with any other app download, your phone may require you to verify saved payment information or Android password. You do NOT have to create a WebEx account.  It is very helpful to have this downloaded before your visit.    2-3 DAYS BEFORE YOUR  APPOINTMENT  You will receive a telephone call from one of our [facility] team members - your caller ID may say "Unknown caller." If this is a video visit, we will confirm that you have been able to download the WebEx app. We will remind you check your blood pressure, heart rate and weight prior to your scheduled appointment. If you have an Apple Watch or Kardia, please upload any pertinent ECG strips the day before or morning of your appointment to [place]. Our staff will also make sure you have reviewed the consent and agree to move forward with your scheduled tele-health visit.     THE DAY OF YOUR APPOINTMENT  Approximately 15 minutes prior to your scheduled appointment, you will receive a telephone call from one of [facility] team - your caller ID may say "Unknown caller."  Our staff will confirm medications, vital signs for the day and any symptoms you may be experiencing. Please have this information available prior to the time of visit start. It may also be helpful for you to have a pad of paper and pen handy for any instructions given during your visit. They will also walk you through joining the WebEx smartphone meeting if this is a video visit.    CONSENT FOR TELE-HEALTH VISIT - PLEASE REVIEW  I hereby voluntarily request, consent and authorize [facility] and its employed or contracted physicians, physician assistants, nurse practitioners or other licensed health care professionals (the Practitioner), to provide me with  telemedicine health care services (the "Services") as deemed necessary by the treating Practitioner. I acknowledge and consent to receive the Services by the Practitioner via telemedicine. I understand that the telemedicine visit will involve communicating with the Practitioner through live audiovisual communication technology and the disclosure of certain medical information by electronic transmission. I acknowledge that I have been given the opportunity to request an  in-person assessment or other available alternative prior to the telemedicine visit and am voluntarily participating in the telemedicine visit.  I understand that I have the right to withhold or withdraw my consent to the use of telemedicine in the course of my care at any time, without affecting my right to future care or treatment, and that the Practitioner or I may terminate the telemedicine visit at any time. I understand that I have the right to inspect all information obtained and/or recorded in the course of the telemedicine visit and may receive copies of available information for a reasonable fee.  I understand that some of the potential risks of receiving the Services via telemedicine include:  [name] Delay or interruption in medical evaluation due to technological equipment failure or disruption; . Information transmitted may not be sufficient (e.g. poor resolution of images) to allow for appropriate medical decision making by the Practitioner; and/or  . In rare instances, security protocols could fail, causing a breach of personal health information.  Furthermore, I acknowledge that it is my responsibility to provide information about my medical history, conditions and care that is complete and accurate to the best of my ability. I acknowledge that Practitioner's advice, recommendations, and/or decision may be based on factors not within their control, such as incomplete or inaccurate data provided by me or distortions of diagnostic images or specimens that may result from electronic transmissions. I understand that the practice of medicine is not an exact science and that Practitioner makes no warranties or guarantees regarding treatment outcomes. I acknowledge that I will receive a copy of this consent concurrently upon execution via email to the email address I last provided but may also request a printed copy by calling the office of [facility].    I understand that my insurance will be  billed for this visit.   I have read or had this consent read to me. . I understand the contents of this consent, which adequately explains the benefits and risks of the Services being provided via telemedicine.  . I have been provided ample opportunity to ask questions regarding this consent and the Services and have had my questions answered to my satisfaction. . I give my informed consent for the services to be provided through the use of telemedicine in my medical care  By participating in this telemedicine visit I agree to the above.

## 2018-04-27 NOTE — Progress Notes (Signed)
Virtual Visit via Video Note    Evaluation Performed:  Follow-up visit  This visit type was conducted due to national recommendations for restrictions regarding the COVID-19 Pandemic (e.g. social distancing).  This format is felt to be most appropriate for this patient at this time.  All issues noted in this document were discussed and addressed.  No physical exam was performed .  Please refer to the patient's chart (MyChart message for video visits and phone note for telephone visits) for the patient's consent to telehealth for [facility].  Date:  04/28/2018   ID:  Aaron Thompson, DOB 05-22-1960, MRN [id number]  Patient Location:  Home  Provider location:   Office  PCP:  Abner Greenspan, MD  Cardiologist:  [name] Electrophysiologist:  None   Chief Complaint:  F/U CHF   History of Present Illness:    Aaron Thompson is a 58 y.o. male who presents via audio/video conferencing for a telehealth visit today.  He has long standing PAF and CHF non ischemic. First diagnosed in 2013 Post AF ablation  2017 and on Tikosyn  , LBBB OSA unable to wear CPAP no history of CAD  On optimum Rx with beta blocker, entresto and diuretic Has AICD/CRT device as well Interrogation indicates BiV pacing over 95% of time and maintaining NSR. 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 Wants to do hospitality and work with trains   The patient does not have symptoms concerning for COVID-19 infection (fever, chills, cough, or new shortness of breath).    Prior CV studies:   The following studies were reviewed today:  Echo 02/02/18 see HPI EF 25-30% Notes from CHF clincic AICD Interrogations PACE-ART   Past Medical History:  Diagnosis Date  . AICD (automatic cardioverter/defibrillator) present   . Anxiety state, unspecified   . Calculus of kidney 1981   "passed it on  my own"  . Chronic systolic CHF (congestive heart failure) ([place])   . Dermatophytosis of the body   . DJD (degenerative joint disease)   . Esophageal reflux   . Hx of colonic polyps   . Hypertension   . LBBB (left bundle branch block)    intermittent  . Non-ischemic cardiomyopathy ([place])    a. cath 2013: minor nonobstructive CAD.  . OSA on CPAP    pt can't use it.  . Osteoarthrosis, unspecified whether generalized or localized, hand   . Other chronic nonalcoholic liver disease    fatty liver  . PAF (paroxysmal atrial fibrillation) ([place])    a. s/p DCCV 6/11; previously on Pradaxa;  b. Event Monitor 2012->No PAF;  c. 09/2011 s/p DCCV ->Xarelto started. d. 03/2014: inappropriate ICD shocks for AF-RVR, started on Tikosyn, Xarelto restarted.  . Type II diabetes mellitus ([place])   . Unspecified hearing loss    no hearing aid   Past Surgical History:  Procedure Laterality Date  . Abdominal US  01/2007   Fatty liver, no gallstones  . APPENDECTOMY    . ATRIAL TACH ABLATION  09/2015  . BI-VENTRICULAR IMPLANTABLE CARDIOVERTER DEFIBRILLATOR [name] 02/14/2013   STJ CRTD implanted by Dr Caryl Comes  . BILATERAL KNEE ARTHROSCOPY    . CARDIOVERSION  09/29/2011   Procedure: CARDIOVERSION;  Surgeon: Thayer Headings, MD;  Location: [facility];  Service: Cardiovascular;  Laterality: [name];  . COLONOSCOPY WITH PROPOFOL [name] 10/26/2016   Procedure:  COLONOSCOPY WITH PROPOFOL;  Surgeon: Irene Shipper, MD;  Location: [facility];  Service: Endoscopy;  Laterality: [name];  . DOPPLER ECHOCARDIOGRAPHY  11/2009   Decreased EF of 35-40%  . ELECTROPHYSIOLOGIC STUDY [name] 10/14/2015   Procedure: Atrial Fibrillation Ablation;  Surgeon: [name], MD;  Location: [facility];  Service: Cardiovascular;  Laterality: [name];  . LEFT HEART CATHETERIZATION WITH CORONARY ANGIOGRAM [name] 08/18/2011   Procedure: LEFT HEART CATHETERIZATION WITH CORONARY ANGIOGRAM;  Surgeon: Tzipora Mcinroy M Martinique, MD;  Location: [facility] CATH LAB;  Service: Cardiovascular;   Laterality: [name];  . NECK SURGERY     plate/fusion  . Stress Cardiolite  08/2005   Normal, EF 42%  . UPPER GASTROINTESTINAL ENDOSCOPY  08/2006   GERD     Current Meds  Medication Sig  . carvedilol (COREG) 25 MG tablet TAKE 2 TABLETS(50 MG) BY MOUTH TWICE DAILY WITH A MEAL  . cetirizine (ZYRTEC) 10 MG tablet Take 10 mg by mouth daily as needed for allergies.  [name] dofetilide (TIKOSYN) 500 MCG capsule TAKE 1 CAPSULE(500 MCG) BY MOUTH TWICE DAILY  . empagliflozin (JARDIANCE) 10 MG TABS tablet Take 10 mg by mouth daily.  [name] glipiZIDE (GLIPIZIDE XL) 10 MG 24 hr tablet Take 1 tablet (10 mg total) by mouth daily with breakfast.  . glucose blood (ONE TOUCH ULTRA TEST) test strip CHECK GLUCOSE TWICE DAILY AND AS NEEDED, DX. E11.65 (UNCONTROLLED DM)  . metFORMIN (GLUCOPHAGE-XR) 500 MG 24 hr tablet TAKE 3 TABLETS(1500 MG) BY MOUTH DAILY WITH BREAKFAST  . Multiple Vitamin (MULTIVITAMIN) tablet Take 1 tablet by mouth daily.    [name] Bicarbonate (ZEGERID OTC) 20-1100 MG CAPS Take 1 capsule by mouth 2 (two) times daily.   . potassium chloride SA (K-DUR,KLOR-CON) 20 MEQ tablet Take 2 tablets (40 mEq total) by mouth 2 (two) times daily.  . sacubitril-valsartan (ENTRESTO) 97-103 MG Take 1 tablet by mouth 2 (two) times daily. Pt need to keep upcoming appt in June for further refills  . spironolactone (ALDACTONE) 25 MG tablet TAKE 1 TABLET(25 MG) BY MOUTH DAILY  . traMADol (ULTRAM) 50 MG tablet TAKE 1 TABLET(50 MG) BY MOUTH EVERY 12 HOURS AS NEEDED  . XARELTO 20 MG TABS tablet TAKE 1 TABLET(20 MG) BY MOUTH DAILY WITH SUPPER     Allergies:   Patient has no known allergies.   Social History   Tobacco Use  . Smoking status: Never Smoker  . Smokeless tobacco: Former Systems developer    Types: Snuff  . Tobacco comment: 02/14/2013 "quit snuff in 2006"  Substance Use Topics  . Alcohol use: No    Alcohol/week: 0.0 standard drinks  . Drug use: No     Family Hx: The patient's family history includes  Cirrhosis in his maternal uncle; Colon cancer in his paternal grandmother; Colon polyps in his sister; Diabetes in his brother, brother, brother, and mother; Heart attack in his paternal grandfather; Heart failure in his father and mother; Hypertension in his father and mother; Kidney disease in his brother; Leukemia in his sister; Multiple myeloma in his sister; Other in his sister; Prostate cancer in his brother and father; Stroke in his paternal grandmother. There is no history of Esophageal cancer, Stomach cancer, or Pancreatic cancer.  ROS:   Please see the history of present illness.     All other systems reviewed and are negative.   Labs/Other Tests and Data Reviewed:    Recent Labs: 07/05/2017: ALT 18; TSH 2.09 12/07/2017: BUN 18; Creatinine, Ser 0.86; Magnesium 2.2; Potassium  4.5; Sodium 137   Recent Lipid Panel Lab Results  Component Value Date/Time   CHOL 165 07/05/2017 08:39 AM   TRIG 158.0 (H) 07/05/2017 08:39 AM   HDL 34.40 (L) 07/05/2017 08:39 AM   CHOLHDL 5 07/05/2017 08:39 AM   LDLCALC 99 07/05/2017 08:39 AM   LDLDIRECT 97.0 06/16/2016 07:55 AM    Wt Readings from Last 3 Encounters:  04/28/18 132.5 kg  01/12/18 135.7 kg  12/07/17 133.4 kg     Objective:    Vital Signs:  Pulse 70   Ht '6\' 4"'  (1.93 m)   Wt 132.5 kg   BMI 35.54 kg/m    Well nourished, well developed male in no acute distress. Obese Skin warm and dry No labored breathing AICD under left clavicle  Trace LE edema   ASSESSMENT & PLAN:    1.  CHF:  Non ischemic compliant with meds on maximum goal directed RX. Functional class 2 consider f/u echo in a year 2. PAF:  Post ablation 2017 on tikosyn Mg and BMET normla 12/07/17 F/U EP 3. CRT-AICD:  No defibrillations Biv pacing over 95% of time stable f/u EP 4. OSA:  Consider f/u with dentist to try oral device cannot tolerate CPAP  Discussed weight loss 5. DM:  On jardiance f/u primary target A1C 6.5 or less 6. Anticoagulation No bleeding issues  continue xarelto   COVID-19 Education: The signs and symptoms of COVID-19 were discussed with the patient and how to seek care for testing (follow up with PCP or arrange E-visit).  The importance of social distancing was discussed today.  Patient Risk:   After full review of this patient's clinical status, I feel that they are at least moderate risk at this time.  Time:   Today, I have spent 30 minutes with the patient with telehealth technology discussing his chronic systolic CHF, AICD, PAF, anticoagulation , DM and future need for f/u TTE .     Medication Adjustments/Labs and Tests Ordered: Current medicines are reviewed at length with the patient today.  Concerns regarding medicines are outlined above.  Tests Ordered: No orders of the defined types were placed in this encounter.  Medication Changes: No orders of the defined types were placed in this encounter.   Disposition:  Follow up 3 months   Signed, Jenkins Rouge, MD  04/28/2018 9:53 AM    [facility]

## 2018-04-28 ENCOUNTER — Other Ambulatory Visit: Payer: Self-pay

## 2018-04-28 ENCOUNTER — Telehealth (INDEPENDENT_AMBULATORY_CARE_PROVIDER_SITE_OTHER): Payer: PPO | Admitting: Cardiovascular Disease

## 2018-04-28 ENCOUNTER — Encounter: Payer: Self-pay | Admitting: Cardiovascular Disease

## 2018-04-28 DIAGNOSIS — E1159 Type 2 diabetes mellitus with other circulatory complications: Secondary | ICD-10-CM | POA: Diagnosis not present

## 2018-04-28 DIAGNOSIS — G4733 Obstructive sleep apnea (adult) (pediatric): Secondary | ICD-10-CM

## 2018-04-28 DIAGNOSIS — Z7901 Long term (current) use of anticoagulants: Secondary | ICD-10-CM

## 2018-04-28 DIAGNOSIS — I5022 Chronic systolic (congestive) heart failure: Secondary | ICD-10-CM

## 2018-04-28 NOTE — Patient Instructions (Addendum)
Medication Instructions:  Your physician recommends that you continue on your current medications as directed. Please refer to the Current Medication list given to you today.  Labwork: NONE  Testing/Procedures: NONE  Follow-Up: Your physician wants you to follow-up in: 3 months with Dr. [name]. If you don't receive a letter, please call our office to schedule the follow-up appointment.   If you need a refill on your cardiac medications before your next appointment, please call your pharmacy.

## 2018-05-15 ENCOUNTER — Other Ambulatory Visit: Payer: Self-pay

## 2018-05-15 MED ORDER — SPIRONOLACTONE 25 MG PO TABS: ORAL_TABLET | ORAL | 0 refills | Status: DC

## 2018-05-15 NOTE — Progress Notes (Signed)
Med refilled per pt request.

## 2018-05-16 ENCOUNTER — Encounter: Payer: Self-pay | Admitting: Family Medicine

## 2018-05-16 ENCOUNTER — Ambulatory Visit (INDEPENDENT_AMBULATORY_CARE_PROVIDER_SITE_OTHER): Payer: PPO | Admitting: Family Medicine

## 2018-05-16 VITALS — BP 126/86 | Wt 288.0 lb

## 2018-05-16 DIAGNOSIS — I480 Paroxysmal atrial fibrillation: Secondary | ICD-10-CM

## 2018-05-16 DIAGNOSIS — E669 Obesity, unspecified: Secondary | ICD-10-CM

## 2018-05-16 DIAGNOSIS — E118 Type 2 diabetes mellitus with unspecified complications: Secondary | ICD-10-CM | POA: Diagnosis not present

## 2018-05-16 DIAGNOSIS — E1159 Type 2 diabetes mellitus with other circulatory complications: Secondary | ICD-10-CM | POA: Diagnosis not present

## 2018-05-16 DIAGNOSIS — I10 Essential (primary) hypertension: Secondary | ICD-10-CM | POA: Diagnosis not present

## 2018-05-16 DIAGNOSIS — IMO0002 Reserved for concepts with insufficient information to code with codable children: Secondary | ICD-10-CM

## 2018-05-16 DIAGNOSIS — E7800 Pure hypercholesterolemia, unspecified: Secondary | ICD-10-CM | POA: Diagnosis not present

## 2018-05-16 DIAGNOSIS — I5022 Chronic systolic (congestive) heart failure: Secondary | ICD-10-CM

## 2018-05-16 DIAGNOSIS — E1165 Type 2 diabetes mellitus with hyperglycemia: Secondary | ICD-10-CM | POA: Diagnosis not present

## 2018-05-16 MED ORDER — GLIPIZIDE ER 10 MG PO TB24: 10.0000 mg | ORAL_TABLET | Freq: Every day | ORAL | 11 refills | Status: DC

## 2018-05-16 MED ORDER — METFORMIN HCL ER 500 MG PO TB24: 1500.0000 mg | ORAL_TABLET | Freq: Every day | ORAL | 11 refills | Status: DC

## 2018-05-16 NOTE — Assessment & Plan Note (Signed)
Stable/no changes and doing well

## 2018-05-16 NOTE — Assessment & Plan Note (Signed)
Unfortunately did not tolerate atorvastatin (cardiology aware) Disc goals for lipids and reasons to control them Rev last labs with pt Rev low sat fat diet in detail Will plan visit (drive thru) for labs  Enc a healthy diet

## 2018-05-16 NOTE — Progress Notes (Signed)
Refills needed 30 day supply   Metformin  Glipizide   Virtual Visit via Video Note  I connected with Aaron Thompson on 05/16/18 at  2:30 PM EDT by a video enabled telemedicine application and verified that I am speaking with the correct person using two identifiers. The patient is at  Home today  I am in my office at [place]    I discussed the limitations of evaluation and management by telemedicine and the availability of in person appointments. The patient expressed understanding and agreed to proceed.  History of Present Illness: Here for f/u of chronic medical problems   Doing ok  Gets out with his dog  Feeling pretty good in general   Wt Readings from Last 3 Encounters:  04/28/18 292 lb (132.5 kg)  01/12/18 299 lb 3.2 oz (135.7 kg)  12/07/17 294 lb (133.4 kg)  288 today  Has lost weight - working on it  Weight at home  35.06 kg/m    a fib- s/p ablation  Seen by cardiology On coreg and tykosyn and xarelto For heart failure spironolactone and entresto (this really helped heart function)  Has frequent f/u with Dr Sung Amabile  OSA- cannot tolerate cpap  Weight loss has helped that   Blood pressure was well controlled in dec  126/86 No cp or palpitations or headaches or edema  No side effects to medicines  BP Readings from Last 3 Encounters:  01/12/18 102/80  12/07/17 120/74  10/10/17 106/80     DM2- due for A1C Diabetes Home sugar results -overall good  Usually 130s-140s -watches his diet most of the time  DM diet -improved  Exercise -as tolerated (with CHF)  Symptoms-none  A1C last  Lab Results  Component Value Date   HGBA1C 6.9 (H) 10/10/2017  taking metformin xl  Also glipizide  Also jardiance added by Dr Jeffie Pollock    No problems with medications  Renal protection- ARB Last eye exam  utd 11/19   Hyperlipidemia  Lab Results  Component Value Date   CHOL 165 07/05/2017   HDL 34.40 (L) 07/05/2017   LDLCALC 99 07/05/2017   LDLDIRECT 97.0  06/16/2016   TRIG 158.0 (H) 07/05/2017   CHOLHDL 5 07/05/2017  he was prev on a statin  He had side effects -made him hurt   Watching out for fatty foods  A little harder at home but eating less in general   Review of Systems  Constitutional: Negative for chills, fever, malaise/fatigue and weight loss.  Eyes: Negative for blurred vision.  Respiratory: Positive for shortness of breath. Negative for cough, hemoptysis, sputum production and wheezing.        Baseline sob on exertion (from CHF) is improved   Cardiovascular: Negative for chest pain, palpitations and leg swelling.  Gastrointestinal: Negative for heartburn, nausea and vomiting.  Genitourinary: Negative for urgency.  Musculoskeletal: Negative for myalgias.  Skin: Negative for itching and rash.  Neurological: Negative for dizziness and headaches.  Endo/Heme/Allergies: Negative for polydipsia.    Patient Active Problem List   Diagnosis Date Noted  . History of colonic polyps   . Lower back pain 09/07/2016  . Family history of prostate cancer 06/23/2016  . Frequent urination 06/23/2016  . Encounter for hepatitis C screening test for low risk patient 06/23/2016  . Encounter for screening for HIV 06/23/2016  . Colon polyps 06/23/2016  . Laboratory examination ordered as part of a routine general medical examination 03/08/2016  . Routine general medical examination at a  health care facility 03/08/2016  . Prostate cancer screening 03/08/2016  . A-fib ([place]) 10/14/2015  . Hypokalemia 08/12/2015  . Inappropriate shocks from ICD (implantable cardioverter-defibrillator) 03/17/2015  . Hearing loss of right ear due to cerumen impaction 03/13/2015  . Obesity (BMI 30-39.9) 12/05/2014  . Osteoarthritis of both hands 07/31/2014  . Low serum potassium level 04/21/2014  . ICD (implantable cardioverter-defibrillator) discharge 04/19/2014  . Chronic systolic heart failure ([place]) 02/14/2013  . Hyperlipidemia 02/05/2013  . Uncontrolled  type 2 diabetes mellitus with circulatory disorder, without long-term current use of insulin ([place]) 09/30/2011  . Nonischemic cardiomyopathy ([place]) 08/18/2011  . Chest pain on exertion 08/17/2011  . Palpitations 05/12/2010  . CARDIOMYOPATHY OTHER DISEASES CLASSIFIED ELSW 07/16/2009  . OSTEOARTHROSIS UNSPEC WHETHER GEN/LOCALIZED HAND 08/16/2008  . Essential hypertension 05/11/2007  . History of renal calculi 05/11/2007  . Fatty liver 03/20/2007  . ANXIETY 10/04/2006  . HEARING IMPAIRMENT 10/04/2006  . FOLLICULITIS 54/49/2010  . GERD 09/20/2006   Past Medical History:  Diagnosis Date  . AICD (automatic cardioverter/defibrillator) present   . Anxiety state, unspecified   . Calculus of kidney 1981   "passed it on my own"  . Chronic systolic CHF (congestive heart failure) ([place])   . Dermatophytosis of the body   . DJD (degenerative joint disease)   . Esophageal reflux   . Hx of colonic polyps   . Hypertension   . LBBB (left bundle branch block)    intermittent  . Non-ischemic cardiomyopathy ([place])    a. cath 2013: minor nonobstructive CAD.  . OSA on CPAP    pt can't use it.  . Osteoarthrosis, unspecified whether generalized or localized, hand   . Other chronic nonalcoholic liver disease    fatty liver  . PAF (paroxysmal atrial fibrillation) ([place])    a. s/p DCCV 6/11; previously on Pradaxa;  b. Event Monitor 2012->No PAF;  c. 09/2011 s/p DCCV ->Xarelto started. d. 03/2014: inappropriate ICD shocks for AF-RVR, started on Tikosyn, Xarelto restarted.  . Type II diabetes mellitus ([place])   . Unspecified hearing loss    no hearing aid   Past Surgical History:  Procedure Laterality Date  . Abdominal US  01/2007   Fatty liver, no gallstones  . APPENDECTOMY    . ATRIAL TACH ABLATION  09/2015  . BI-VENTRICULAR IMPLANTABLE CARDIOVERTER DEFIBRILLATOR [name] 02/14/2013   STJ CRTD implanted by Dr Caryl Comes  . BILATERAL KNEE ARTHROSCOPY    . CARDIOVERSION  09/29/2011   Procedure: CARDIOVERSION;   Surgeon: Thayer Headings, MD;  Location: [facility];  Service: Cardiovascular;  Laterality: [name];  . COLONOSCOPY WITH PROPOFOL [name] 10/26/2016   Procedure: COLONOSCOPY WITH PROPOFOL;  Surgeon: Irene Shipper, MD;  Location: [facility];  Service: Endoscopy;  Laterality: [name];  . DOPPLER ECHOCARDIOGRAPHY  11/2009   Decreased EF of 35-40%  . ELECTROPHYSIOLOGIC STUDY [name] 10/14/2015   Procedure: Atrial Fibrillation Ablation;  Surgeon: [name], MD;  Location: [facility];  Service: Cardiovascular;  Laterality: [name];  . LEFT HEART CATHETERIZATION WITH CORONARY ANGIOGRAM [name] 08/18/2011   Procedure: LEFT HEART CATHETERIZATION WITH CORONARY ANGIOGRAM;  Surgeon: Peter M Martinique, MD;  Location: [facility];  Service: Cardiovascular;  Laterality: [name];  . NECK SURGERY     plate/fusion  . Stress Cardiolite  08/2005   Normal, EF 42%  . UPPER GASTROINTESTINAL ENDOSCOPY  08/2006   GERD   Social History   Tobacco Use  . Smoking status: Never Smoker  . Smokeless tobacco: Former Systems developer  Types: Snuff  . Tobacco comment: 02/14/2013 "quit snuff in 2006"  Substance Use Topics  . Alcohol use: No    Alcohol/week: 0.0 standard drinks  . Drug use: No   Family History  Problem Relation Age of Onset  . Hypertension Mother   . Diabetes Mother   . Heart failure Mother        Died @ 45  . Hypertension Father   . Heart failure Father        Died @ 19  . Prostate cancer Father   . Diabetes Brother   . Kidney disease Brother   . Diabetes Brother   . Prostate cancer Brother   . Diabetes Brother   . Other Sister   . Heart attack Paternal Grandfather   . Stroke Paternal Grandmother   . Colon cancer Paternal Grandmother   . Multiple myeloma Sister   . Leukemia Sister   . Colon polyps Sister   . Cirrhosis Maternal Uncle        alcohol related  . Esophageal cancer Neg Hx   . Stomach cancer Neg Hx   . Pancreatic cancer Neg Hx    Allergies  Allergen Reactions  . Atorvastatin     Muscle pain    Current  Outpatient Medications on File Prior to Visit  Medication Sig Dispense Refill  . carvedilol (COREG) 25 MG tablet TAKE 2 TABLETS(50 MG) BY MOUTH TWICE DAILY WITH A MEAL 360 tablet 1  . cetirizine (ZYRTEC) 10 MG tablet Take 10 mg by mouth daily as needed for allergies.    [name] dofetilide (TIKOSYN) 500 MCG capsule TAKE 1 CAPSULE(500 MCG) BY MOUTH TWICE DAILY 180 capsule 3  . empagliflozin (JARDIANCE) 10 MG TABS tablet Take 10 mg by mouth daily. 30 tablet 11  . glucose blood (ONE TOUCH ULTRA TEST) test strip CHECK GLUCOSE TWICE DAILY AND AS NEEDED, DX. E11.65 (UNCONTROLLED DM) 100 each 5  . Multiple Vitamin (MULTIVITAMIN) tablet Take 1 tablet by mouth daily.      [name] Bicarbonate (ZEGERID OTC) 20-1100 MG CAPS Take 1 capsule by mouth 2 (two) times daily.     . potassium chloride SA (K-DUR,KLOR-CON) 20 MEQ tablet Take 2 tablets (40 mEq total) by mouth 2 (two) times daily. 360 tablet 2  . sacubitril-valsartan (ENTRESTO) 97-103 MG Take 1 tablet by mouth 2 (two) times daily. Pt need to keep upcoming appt in June for further refills 180 tablet 2  . spironolactone (ALDACTONE) 25 MG tablet TAKE 1 TABLET(25 MG) BY MOUTH DAILY 90 tablet 0  . traMADol (ULTRAM) 50 MG tablet TAKE 1 TABLET(50 MG) BY MOUTH EVERY 12 HOURS AS NEEDED 60 tablet 2  . XARELTO 20 MG TABS tablet TAKE 1 TABLET(20 MG) BY MOUTH DAILY WITH SUPPER 30 tablet 5   No current facility-administered medications on file prior to visit.     Observations/Objective: Pt appears well and obese (baseline)  Cheerful and talkative  No facial swelling or asymmetry  No skin change/rash or pallor  Nl speech w/o hoarseness or slurring  No cough  No sob with speech   Assessment and Plan: Problem List Items Addressed This Visit      Cardiovascular and Mediastinum   Essential hypertension    Well controlled 126/86 per pt today at home  Working on weight loss-commended        Relevant Orders   Comprehensive metabolic panel   Lipid panel    Uncontrolled type 2 diabetes mellitus with circulatory disorder, without long-term current use  of insulin ([place])    Per pt doing better  Has lost weight with better eating  Continues metformin xr and glipizide and jardiance No hypoglycemia  Commended on lifestyle change  Will plan drive thru lab visit to check a1c  utd eye exam Good foot care/no problems       Relevant Medications   metFORMIN (GLUCOPHAGE-XR) 500 MG 24 hr tablet   glipiZIDE (GLIPIZIDE XL) 10 MG 24 hr tablet   Chronic systolic heart failure ([place])    Per pt - some improvement with entresto and he now has more stamina  Still sob with exertion but able to get to the mailbox and back  Continues frequent cardiology f/u  No ICD shocks since last appt -overall doing well Also loosing weight with good effort       A-fib ([place])    Stable/no changes and doing well         Other   Hyperlipidemia    Unfortunately did not tolerate atorvastatin (cardiology aware) Disc goals for lipids and reasons to control them Rev last labs with pt Rev low sat fat diet in detail Will plan visit (drive thru) for labs  Enc a healthy diet       Relevant Orders   Lipid panel   Obesity (BMI 30-39.9)    Discussed how this problem influences overall health and the risks it imposes  Reviewed plan for weight loss with lower calorie diet (via better food choices and also portion control or program like weight watchers) and exercise building up to or more than 30 minutes 5 days per week including some aerobic activity   Commended on wt loss so far       Relevant Medications   metFORMIN (GLUCOPHAGE-XR) 500 MG 24 hr tablet   glipiZIDE (GLIPIZIDE XL) 10 MG 24 hr tablet    Other Visit Diagnoses    Type 2 diabetes mellitus with complication, without long-term current use of insulin (H[facility])   (Chronic)  -  Primary   Relevant Medications   metFORMIN (GLUCOPHAGE-XR) 500 MG 24 hr tablet   glipiZIDE (GLIPIZIDE XL) 10 MG 24 hr tablet   Other  Relevant Orders   Hemoglobin A1c   PAF (paroxysmal atrial fibrillation) ([facility])   (Chronic)         Follow Up Instructions: Continue working on healthy diet /exercise and weight loss)  Take care of yourself  No change in medications  We will call you to schedule drive through labs for A1C and chemistries and cholesterol  Avoid red meat/ fried foods/ egg yolks/ fatty breakfast meats/ butter, cheese and high fat dairy/ and shellfish     I discussed the assessment and treatment plan with the patient. The patient was provided an opportunity to ask questions and all were answered. The patient agreed with the plan and demonstrated an understanding of the instructions.   The patient was advised to call back or seek an in-person evaluation if the symptoms worsen or if the condition fails to improve as anticipated.     [name], MD

## 2018-05-16 NOTE — Assessment & Plan Note (Signed)
Well controlled 126/86 per pt today at home  Working on weight loss-commended

## 2018-05-16 NOTE — Assessment & Plan Note (Signed)
Per pt - some improvement with entresto and he now has more stamina  Still sob with exertion but able to get to the mailbox and back  Continues frequent cardiology f/u  No ICD shocks since last appt -overall doing well Also loosing weight with good effort

## 2018-05-16 NOTE — Assessment & Plan Note (Signed)
Per pt doing better  Has lost weight with better eating  Continues metformin xr and glipizide and jardiance No hypoglycemia  Commended on lifestyle change  Will plan drive thru lab visit to check a1c  utd eye exam Good foot care/no problems

## 2018-05-16 NOTE — Assessment & Plan Note (Signed)
Discussed how this problem influences overall health and the risks it imposes  Reviewed plan for weight loss with lower calorie diet (via better food choices and also portion control or program like weight watchers) and exercise building up to or more than 30 minutes 5 days per week including some aerobic activity   Commended on wt loss so far  

## 2018-05-17 NOTE — Progress Notes (Signed)
Lab appointment scheduled 05/19/2018 at 9:05 am.

## 2018-05-18 ENCOUNTER — Telehealth: Payer: Self-pay

## 2018-05-18 NOTE — Telephone Encounter (Signed)
Left detailed VM w COVID screen and curbside info   

## 2018-05-19 ENCOUNTER — Other Ambulatory Visit: Payer: Self-pay

## 2018-05-19 ENCOUNTER — Other Ambulatory Visit (INDEPENDENT_AMBULATORY_CARE_PROVIDER_SITE_OTHER): Payer: PPO

## 2018-05-19 DIAGNOSIS — E118 Type 2 diabetes mellitus with unspecified complications: Secondary | ICD-10-CM | POA: Diagnosis not present

## 2018-05-19 DIAGNOSIS — E7800 Pure hypercholesterolemia, unspecified: Secondary | ICD-10-CM

## 2018-05-19 DIAGNOSIS — I10 Essential (primary) hypertension: Secondary | ICD-10-CM

## 2018-05-19 LAB — LIPID PANEL
Cholesterol: 175 mg/dL (ref 0–200)
HDL: 35 mg/dL — ABNORMAL LOW (ref >39.00)
LDL Cholesterol: 108 mg/dL — ABNORMAL HIGH (ref 0–99)
NonHDL: 139.99
Total CHOL/HDL Ratio: 5
Triglycerides: 161 mg/dL — ABNORMAL HIGH (ref 0.0–149.0)
VLDL: 32.2 mg/dL (ref 0.0–40.0)

## 2018-05-19 LAB — COMPREHENSIVE METABOLIC PANEL
ALT: 16 U/L (ref 0–53)
AST: 14 U/L (ref 0–37)
Albumin: 4.3 g/dL (ref 3.5–5.2)
Alkaline Phosphatase: 50 U/L (ref 39–117)
BUN: 22 mg/dL (ref 6–23)
CO2: 30 mEq/L (ref 19–32)
Calcium: 8.9 mg/dL (ref 8.4–10.5)
Chloride: 103 mEq/L (ref 96–112)
Creatinine, Ser: 0.78 mg/dL (ref 0.40–1.50)
GFR: 102.75 mL/min (ref >60.00)
Glucose, Bld: 158 mg/dL — ABNORMAL HIGH (ref 70–99)
Potassium: 4.4 mEq/L (ref 3.5–5.1)
Sodium: 141 mEq/L (ref 135–145)
Total Bilirubin: 0.5 mg/dL (ref 0.2–1.2)
Total Protein: 6.9 g/dL (ref 6.0–8.3)

## 2018-05-19 LAB — HEMOGLOBIN A1C: Hgb A1c MFr Bld: 7.3 % — ABNORMAL HIGH (ref 4.6–6.5)

## 2018-05-22 ENCOUNTER — Telehealth: Payer: Self-pay | Admitting: Family Medicine

## 2018-05-22 NOTE — Telephone Encounter (Signed)
I asked patient to return my call.  Patient needs to schedule a 3 month lab at the end of July to check his A1c.

## 2018-05-31 ENCOUNTER — Other Ambulatory Visit (HOSPITAL_COMMUNITY): Payer: Self-pay | Admitting: Internal Medicine

## 2018-06-16 ENCOUNTER — Ambulatory Visit (INDEPENDENT_AMBULATORY_CARE_PROVIDER_SITE_OTHER): Payer: PPO | Admitting: *Deleted

## 2018-06-16 DIAGNOSIS — I428 Other cardiomyopathies: Secondary | ICD-10-CM

## 2018-06-17 LAB — CUP PACEART REMOTE DEVICE CHECK
Date Time Interrogation Session: 20200523062720
Implantable Lead Implant Date: 20150121
Implantable Lead Implant Date: 20150121
Implantable Lead Implant Date: 20150121
Implantable Lead Location: 753858
Implantable Lead Location: 753859
Implantable Lead Location: 753860
Implantable Lead Model: 7122
Implantable Pulse Generator Implant Date: 20150121
Pulse Gen Serial Number: 7133975

## 2018-06-27 ENCOUNTER — Encounter: Payer: Self-pay | Admitting: Cardiology

## 2018-06-27 NOTE — Progress Notes (Signed)
Remote ICD transmission.   

## 2018-06-28 ENCOUNTER — Ambulatory Visit: Payer: PPO | Admitting: Cardiovascular Disease

## 2018-07-16 ENCOUNTER — Other Ambulatory Visit: Payer: Self-pay | Admitting: Family Medicine

## 2018-07-23 ENCOUNTER — Other Ambulatory Visit: Payer: Self-pay | Admitting: Cardiovascular Disease

## 2018-07-26 NOTE — Telephone Encounter (Signed)
I can't get the imprivata app to work to do tramadol script His primary should probably do this

## 2018-07-28 ENCOUNTER — Other Ambulatory Visit: Payer: Self-pay | Admitting: Cardiovascular Disease

## 2018-08-02 ENCOUNTER — Other Ambulatory Visit: Payer: Self-pay | Admitting: Family Medicine

## 2018-08-06 ENCOUNTER — Other Ambulatory Visit: Payer: Self-pay | Admitting: Family Medicine

## 2018-08-06 NOTE — Progress Notes (Signed)
Virtual Visit via Video Note    Evaluation Performed:  Follow-up visit  This visit type was conducted due to national recommendations for restrictions regarding the COVID-19 Pandemic (e.g. social distancing).  This format is felt to be most appropriate for this patient at this time.  All issues noted in this document were discussed and addressed.  No physical exam was performed .  Please refer to the patient's chart (MyChart message for video visits and phone note for telephone visits) for the patient's consent to telehealth for [facility].  Date:  08/10/2018   ID:  Aaron Thompson Number, DOB [birth year], MRN [id number]  Patient Location:  Home  Provider location:   Office  PCP:  Abner Greenspan, MD  Cardiologist:  [name] Electrophysiologist:  None   Chief Complaint:  F/U CHF   History of Present Illness:    Aaron Thompson is a 58 y.o. male  He has long standing PAF and CHF non ischemic. First diagnosed in 2013 Post AF ablation  2017 and on Tikosyn  , LBBB OSA unable to wear CPAP no history of CAD  On optimum Rx with beta blocker, entresto and diuretic Has AICD/CRT device as well Interrogation indicates BiV pacing over 95% of time and maintaining NSR. 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 Wants to do hospitality and work with trains   The patient does not have symptoms concerning for COVID-19 infection (fever, chills, cough, or new shortness of breath).    Prior CV studies:   The following studies were reviewed today:  Echo 02/02/18 see HPI EF 25-30% Notes from CHF clincic AICD Interrogations PACE-ART   Past Medical History:  Diagnosis Date  . AICD (automatic cardioverter/defibrillator) present   . Anxiety state, unspecified   . Calculus of kidney 1981   "passed it on my own"  . Chronic systolic CHF (congestive heart failure) ([place])   .  Dermatophytosis of the body   . DJD (degenerative joint disease)   . Esophageal reflux   . Hx of colonic polyps   . Hypertension   . LBBB (left bundle branch block)    intermittent  . Non-ischemic cardiomyopathy ([place])    a. cath 2013: minor nonobstructive CAD.  . OSA on CPAP    pt can't use it.  . Osteoarthrosis, unspecified whether generalized or localized, hand   . Other chronic nonalcoholic liver disease    fatty liver  . PAF (paroxysmal atrial fibrillation) ([place])    a. s/p DCCV 6/11; previously on Pradaxa;  b. Event Monitor 2012->No PAF;  c. 09/2011 s/p DCCV ->Xarelto started. d. 03/2014: inappropriate ICD shocks for AF-RVR, started on Tikosyn, Xarelto restarted.  . Type II diabetes mellitus ([place])   . Unspecified hearing loss    no hearing aid   Past Surgical History:  Procedure Laterality Date  . Abdominal US  01/2007   Fatty liver, no gallstones  . APPENDECTOMY    . ATRIAL TACH ABLATION  09/2015  . BI-VENTRICULAR IMPLANTABLE CARDIOVERTER DEFIBRILLATOR [name] 02/14/2013   STJ CRTD implanted by Dr Caryl Comes  . BILATERAL KNEE ARTHROSCOPY    . CARDIOVERSION  09/29/2011   Procedure: CARDIOVERSION;  Surgeon: Thayer Headings, MD;  Location: [place];  Service: Cardiovascular;  Laterality: [name];  . COLONOSCOPY WITH PROPOFOL [name] 10/26/2016   Procedure: COLONOSCOPY WITH PROPOFOL;  Surgeon: Irene Shipper, MD;  Location: [facility];  Service: Endoscopy;  Laterality: [name];  . DOPPLER ECHOCARDIOGRAPHY  11/2009   Decreased EF of 35-40%  . ELECTROPHYSIOLOGIC STUDY [name] 10/14/2015   Procedure: Atrial Fibrillation Ablation;  Surgeon: [name], MD;  Location: [place] [facility];  Service: Cardiovascular;  Laterality: [name];  . LEFT HEART CATHETERIZATION WITH CORONARY ANGIOGRAM [name] 08/18/2011   Procedure: LEFT HEART CATHETERIZATION WITH CORONARY ANGIOGRAM;  Surgeon: Malvern Kadlec M Martinique, MD;  Location: [facility];  Service: Cardiovascular;  Laterality: [name];  . NECK SURGERY     plate/fusion  . Stress Cardiolite   08/2005   Normal, EF 42%  . UPPER GASTROINTESTINAL ENDOSCOPY  08/2006   GERD     Current Meds  Medication Sig  . carvedilol (COREG) 25 MG tablet TAKE 2 TABLETS(50 MG) BY MOUTH TWICE DAILY WITH A MEAL  . cetirizine (ZYRTEC) 10 MG tablet Take 10 mg by mouth daily as needed for allergies.  [name] dofetilide (TIKOSYN) 500 MCG capsule TAKE 1 CAPSULE(500 MCG) BY MOUTH TWICE DAILY  . glipiZIDE (GLIPIZIDE XL) 10 MG 24 hr tablet Take 1 tablet (10 mg total) by mouth daily with breakfast.  . glucose blood (ONE TOUCH ULTRA TEST) test strip CHECK GLUCOSE TWICE DAILY AND AS NEEDED, DX. E11.65 (UNCONTROLLED DM)  . JARDIANCE 10 MG TABS tablet TAKE 1 TABLET BY MOUTH DAILY  . metFORMIN (GLUCOPHAGE-XR) 500 MG 24 hr tablet TAKE 3 TABLETS(1500 MG) BY MOUTH DAILY WITH BREAKFAST  . Multiple Vitamin (MULTIVITAMIN) tablet Take 1 tablet by mouth daily.    [name] Bicarbonate (ZEGERID OTC) 20-1100 MG CAPS Take 1 capsule by mouth 2 (two) times daily.   . potassium chloride SA (K-DUR,KLOR-CON) 20 MEQ tablet Take 2 tablets (40 mEq total) by mouth 2 (two) times daily.  . sacubitril-valsartan (ENTRESTO) 97-103 MG Take 1 tablet by mouth 2 (two) times daily. Pt need to keep upcoming appt in June for further refills  . spironolactone (ALDACTONE) 25 MG tablet TAKE 1 TABLET(25 MG) BY MOUTH DAILY  . traMADol (ULTRAM) 50 MG tablet TAKE 1 TABLET(50 MG) BY MOUTH EVERY 12 HOURS AS NEEDED  . XARELTO 20 MG TABS tablet TAKE 1 TABLET(20 MG) BY MOUTH DAILY WITH SUPPER     Allergies:   Atorvastatin   Social History   Tobacco Use  . Smoking status: Never Smoker  . Smokeless tobacco: Former Systems developer    Types: Snuff  . Tobacco comment: 02/14/2013 "quit snuff in 2006"  Substance Use Topics  . Alcohol use: No    Alcohol/week: 0.0 standard drinks  . Drug use: No     Family Hx: The patient's family history includes Cirrhosis in his maternal uncle; Colon cancer in his paternal grandmother; Colon polyps in his sister; Diabetes in  his brother, brother, brother, and mother; Heart attack in his paternal grandfather; Heart failure in his father and mother; Hypertension in his father and mother; Kidney disease in his brother; Leukemia in his sister; Multiple myeloma in his sister; Other in his sister; Prostate cancer in his brother and father; Stroke in his paternal grandmother. There is no history of Esophageal cancer, Stomach cancer, or Pancreatic cancer.  ROS:   Please see the history of present illness.     All other systems reviewed and are negative.   Labs/Other Tests and Data Reviewed:    Recent Labs: 12/07/2017: Magnesium 2.2 05/19/2018: ALT 16; BUN 22; Creatinine, Ser 0.78; Potassium 4.4; Sodium 141   Recent Lipid Panel Lab Results  Component Value Date/Time   CHOL  175 05/19/2018 09:23 AM   TRIG 161.0 (H) 05/19/2018 09:23 AM   HDL 35.00 (L) 05/19/2018 09:23 AM   CHOLHDL 5 05/19/2018 09:23 AM   LDLCALC 108 (H) 05/19/2018 09:23 AM   LDLDIRECT 97.0 06/16/2016 07:55 AM    Wt Readings from Last 3 Encounters:  08/10/18 279 lb (126.6 kg)  05/16/18 288 lb (130.6 kg)  04/28/18 292 lb (132.5 kg)     Objective:    Vital Signs:  BP 100/62   Pulse 79   Ht _0  (1.93 m)   Wt 279 lb (126.6 kg)   SpO2 96%   BMI 33.96 kg/m    Telephone no exam   ASSESSMENT & PLAN:    1.  CHF:  Non ischemic compliant with meds on maximum goal directed RX. Functional class 2 consider f/u echo in a year 2. PAF:  Post ablation 2017 on tikosyn Mg and BMET normal  05/19/18  F/U EP 3. CRT-AICD:  No defibrillations Biv pacing over 95% of time stable f/u EP 4. OSA:  Consider f/u with dentist to try oral device cannot tolerate CPAP  Discussed weight loss 5. DM:  On jardiance f/u primary target A1C 6.5 or less 6. Anticoagulation No bleeding issues continue xarelto   COVID-19 Education: The signs and symptoms of COVID-19 were discussed with the patient and how to seek care for testing (follow up with PCP or arrange E-visit).  The  importance of social distancing was discussed today.  Patient Risk:   After full review of this patient's clinical status, I feel that they are at least moderate risk at this time.  Time:   Today, I have spent 30 minutes with the patient with telehealth technology discussing his chronic systolic CHF, AICD, PAF, anticoagulation , DM and future need for f/u TTE .     Medication Adjustments/Labs and Tests Ordered: Current medicines are reviewed at length with the patient today.  Concerns regarding medicines are outlined above.  Tests Ordered: No orders of the defined types were placed in this encounter.  Medication Changes: No orders of the defined types were placed in this encounter.   Disposition:  Follow up in 6 months   Signed, Jenkins Rouge, MD  08/10/2018 9:39 AM    [place]

## 2018-08-07 ENCOUNTER — Encounter: Payer: Self-pay | Admitting: Family Medicine

## 2018-08-07 ENCOUNTER — Telehealth: Payer: Self-pay

## 2018-08-07 MED ORDER — TRAMADOL HCL 50 MG PO TABS: ORAL_TABLET | ORAL | 0 refills | Status: DC

## 2018-08-07 NOTE — Telephone Encounter (Signed)
Spoke with patient who states that it's okay to change virtual visit appointment with Dr. [name] on 7/16 to in office visit.      COVID-19 Pre-Screening Questions:  . In the past 7 to 10 days have you had a cough,  shortness of breath, headache, congestion, fever (100 or greater) body aches, chills, sore throat, or sudden loss of taste or sense of smell? No . Have you been around anyone with known Covid 19. No . Have you been around anyone who is awaiting Covid 19 test results in the past 7 to 10 days? No . Have you been around anyone who has been exposed to Covid 19, or has mentioned symptoms of Covid 19 within the past 7 to 10 days? No  If you have any concerns/questions about symptoms patients report during screening (either on the phone or at threshold). Contact the provider seeing the patient or DOD for further guidance.  If neither are available contact a member of the leadership team.

## 2018-08-10 ENCOUNTER — Ambulatory Visit: Payer: PPO | Admitting: Cardiovascular Disease

## 2018-08-10 ENCOUNTER — Encounter: Payer: Self-pay | Admitting: Cardiovascular Disease

## 2018-08-10 ENCOUNTER — Other Ambulatory Visit: Payer: Self-pay

## 2018-08-10 VITALS — BP 100/62 | HR 79 | Ht 76.0 in | Wt 279.0 lb

## 2018-08-10 DIAGNOSIS — I2510 Atherosclerotic heart disease of native coronary artery without angina pectoris: Secondary | ICD-10-CM

## 2018-08-10 NOTE — Patient Instructions (Addendum)
Your physician recommends that you continue on your current medications as directed. Please refer to the Current Medication list given to you today.   Your physician wants you to follow-up in:  6 MONTHS WITH DR NISHAN  You will receive a reminder letter in the mail two months in advance. If you don't receive a letter, please call our office to schedule the follow-up appointment. 

## 2018-08-22 ENCOUNTER — Telehealth: Payer: Self-pay | Admitting: Family Medicine

## 2018-08-22 DIAGNOSIS — IMO0002 Reserved for concepts with insufficient information to code with codable children: Secondary | ICD-10-CM

## 2018-08-22 DIAGNOSIS — I10 Essential (primary) hypertension: Secondary | ICD-10-CM

## 2018-08-22 DIAGNOSIS — E1165 Type 2 diabetes mellitus with hyperglycemia: Secondary | ICD-10-CM

## 2018-08-22 DIAGNOSIS — E1159 Type 2 diabetes mellitus with other circulatory complications: Secondary | ICD-10-CM

## 2018-08-22 DIAGNOSIS — E7800 Pure hypercholesterolemia, unspecified: Secondary | ICD-10-CM

## 2018-08-22 DIAGNOSIS — I428 Other cardiomyopathies: Secondary | ICD-10-CM

## 2018-08-22 NOTE — Telephone Encounter (Signed)
-----   Message from Ellamae Sia sent at 08/15/2018 10:54 AM EDT ----- Regarding: lab orders for Wednesday, 7.29.20 Lab orders, POCT ??

## 2018-08-23 ENCOUNTER — Other Ambulatory Visit: Payer: PPO

## 2018-08-28 ENCOUNTER — Other Ambulatory Visit: Payer: PPO

## 2018-08-29 ENCOUNTER — Other Ambulatory Visit (INDEPENDENT_AMBULATORY_CARE_PROVIDER_SITE_OTHER): Payer: PPO

## 2018-08-29 DIAGNOSIS — IMO0002 Reserved for concepts with insufficient information to code with codable children: Secondary | ICD-10-CM

## 2018-08-29 DIAGNOSIS — E1159 Type 2 diabetes mellitus with other circulatory complications: Secondary | ICD-10-CM | POA: Diagnosis not present

## 2018-08-29 DIAGNOSIS — E1165 Type 2 diabetes mellitus with hyperglycemia: Secondary | ICD-10-CM

## 2018-08-29 DIAGNOSIS — E7800 Pure hypercholesterolemia, unspecified: Secondary | ICD-10-CM

## 2018-08-29 LAB — LIPID PANEL
Cholesterol: 175 mg/dL (ref 0–200)
HDL: 42.1 mg/dL (ref >39.00)
LDL Cholesterol: 108 mg/dL — ABNORMAL HIGH (ref 0–99)
NonHDL: 132.52
Total CHOL/HDL Ratio: 4
Triglycerides: 121 mg/dL (ref 0.0–149.0)
VLDL: 24.2 mg/dL (ref 0.0–40.0)

## 2018-08-29 LAB — HEMOGLOBIN A1C: Hgb A1c MFr Bld: 6.9 % — ABNORMAL HIGH (ref 4.6–6.5)

## 2018-09-05 ENCOUNTER — Other Ambulatory Visit: Payer: PPO

## 2018-09-15 ENCOUNTER — Ambulatory Visit (INDEPENDENT_AMBULATORY_CARE_PROVIDER_SITE_OTHER): Payer: PPO | Admitting: *Deleted

## 2018-09-15 DIAGNOSIS — I428 Other cardiomyopathies: Secondary | ICD-10-CM | POA: Diagnosis not present

## 2018-09-19 LAB — CUP PACEART REMOTE DEVICE CHECK
Battery Remaining Longevity: 11 mo
Battery Remaining Percentage: 16 %
Battery Voltage: 2.74 V
Brady Statistic AP VP Percent: 1 %
Brady Statistic AP VS Percent: 1 %
Brady Statistic AS VP Percent: 98 %
Brady Statistic AS VS Percent: 1 %
Brady Statistic RA Percent Paced: 1 %
Date Time Interrogation Session: 20200824153303
HighPow Impedance: 93 Ohm
HighPow Impedance: 93 Ohm
Implantable Lead Implant Date: 20150121
Implantable Lead Implant Date: 20150121
Implantable Lead Implant Date: 20150121
Implantable Lead Location: 753858
Implantable Lead Location: 753859
Implantable Lead Location: 753860
Implantable Lead Model: 7122
Implantable Pulse Generator Implant Date: 20150121
Lead Channel Impedance Value: 460 Ohm
Lead Channel Impedance Value: 580 Ohm
Lead Channel Impedance Value: 860 Ohm
Lead Channel Pacing Threshold Amplitude: 0.625 V
Lead Channel Pacing Threshold Amplitude: 1 V
Lead Channel Pacing Threshold Amplitude: 2 V
Lead Channel Pacing Threshold Pulse Width: 0.5 ms
Lead Channel Pacing Threshold Pulse Width: 0.5 ms
Lead Channel Pacing Threshold Pulse Width: 0.8 ms
Lead Channel Sensing Intrinsic Amplitude: 12 mV
Lead Channel Sensing Intrinsic Amplitude: 5 mV
Lead Channel Setting Pacing Amplitude: 1.625
Lead Channel Setting Pacing Amplitude: 2 V
Lead Channel Setting Pacing Amplitude: 3.5 V
Lead Channel Setting Pacing Pulse Width: 0.5 ms
Lead Channel Setting Pacing Pulse Width: 0.8 ms
Lead Channel Setting Sensing Sensitivity: 0.5 mV
Pulse Gen Serial Number: 7133975

## 2018-09-22 ENCOUNTER — Encounter: Payer: Self-pay | Admitting: Cardiology

## 2018-09-22 NOTE — Progress Notes (Signed)
Remote ICD transmission.   

## 2018-10-12 ENCOUNTER — Other Ambulatory Visit: Payer: Self-pay | Admitting: Family Medicine

## 2018-10-12 NOTE — Telephone Encounter (Signed)
Name of Medication: Tramadol  Name of Pharmacy: [facility] Date and Quantity: 08/07/18 #60 tab with 0 refills Last Office Visit and Type: med refill on 05/16/18  Next Office Visit and Type: 3 month f/u on 12/01/18 Last Controlled Substance Agreement Date: never done Last [id number] done

## 2018-10-12 NOTE — Telephone Encounter (Signed)
Remind him this is for severe pain - use sparingly and caution of sedation and habit  If he has something he needs to be seen for please let me know

## 2018-10-12 NOTE — Telephone Encounter (Signed)
Pt notified of Dr. Marliss Coots comments. Pt said he takes 1 pill of tramadol every day because he "hurts" pt said about a month ago he "pulled his back" and took 2 tabs daily for a while. Pt said that he doesn't understand why Dr. Glori Bickers is so against him taking the tramadol once a day because he cardiologist told him that was okay and they said he can take med daily. Pt also said his brother takes 4 tramadol a day so he doesn't understand why Dr. Glori Bickers only wants him to use it sparingly instead of daily. I did advise pt it's because it's a controlled sub and that it can be habit forming but he said it helps his stiffness in his hands and back so he is going to continue to take it daily. Pt did agree to schedule an appt with Dr. Glori Bickers to discuss this with her directly so appt scheduled on 10/17/18  To Dr. Glori Bickers as an Juluis Rainier

## 2018-10-12 NOTE — Telephone Encounter (Signed)
Thanks, I will see him then It is a strong pain medication -I would like to make sure nothing is going on that needs more attention  Also this is a controlled substance and I have to follow a protocol for it  (it is potentially habit forming and we need to educate folks about that)

## 2018-10-17 ENCOUNTER — Ambulatory Visit: Payer: PPO | Admitting: Family Medicine

## 2018-10-29 ENCOUNTER — Other Ambulatory Visit: Payer: Self-pay | Admitting: Cardiovascular Disease

## 2018-12-01 ENCOUNTER — Other Ambulatory Visit: Payer: Self-pay

## 2018-12-01 ENCOUNTER — Ambulatory Visit (INDEPENDENT_AMBULATORY_CARE_PROVIDER_SITE_OTHER): Payer: PPO | Admitting: Family Medicine

## 2018-12-01 ENCOUNTER — Encounter: Payer: Self-pay | Admitting: Family Medicine

## 2018-12-01 VITALS — BP 122/74 | HR 84 | Temp 96.3°F | Ht 76.0 in | Wt 293.2 lb

## 2018-12-01 DIAGNOSIS — M25512 Pain in left shoulder: Secondary | ICD-10-CM | POA: Diagnosis not present

## 2018-12-01 DIAGNOSIS — E1165 Type 2 diabetes mellitus with hyperglycemia: Secondary | ICD-10-CM | POA: Diagnosis not present

## 2018-12-01 DIAGNOSIS — I10 Essential (primary) hypertension: Secondary | ICD-10-CM | POA: Diagnosis not present

## 2018-12-01 DIAGNOSIS — E1159 Type 2 diabetes mellitus with other circulatory complications: Secondary | ICD-10-CM | POA: Diagnosis not present

## 2018-12-01 DIAGNOSIS — M545 Low back pain, unspecified: Secondary | ICD-10-CM

## 2018-12-01 DIAGNOSIS — Z23 Encounter for immunization: Secondary | ICD-10-CM

## 2018-12-01 DIAGNOSIS — E669 Obesity, unspecified: Secondary | ICD-10-CM

## 2018-12-01 DIAGNOSIS — M25511 Pain in right shoulder: Secondary | ICD-10-CM

## 2018-12-01 DIAGNOSIS — E7800 Pure hypercholesterolemia, unspecified: Secondary | ICD-10-CM

## 2018-12-01 DIAGNOSIS — IMO0002 Reserved for concepts with insufficient information to code with codable children: Secondary | ICD-10-CM

## 2018-12-01 DIAGNOSIS — G8929 Other chronic pain: Secondary | ICD-10-CM

## 2018-12-01 LAB — POCT GLYCOSYLATED HEMOGLOBIN (HGB A1C): Hemoglobin A1C: 7.6 % — AB (ref 4.0–5.6)

## 2018-12-01 NOTE — Patient Instructions (Addendum)
Try to stick to a diabetic diet  Try to get most of your carbohydrates from produce (with the exception of white potatoes)  Eat less bread/pasta/rice/snack foods/cereals/sweets and other items from the middle of the grocery store (processed carbs)   a1c is up  Please work on weight loss  Also diabetic diet Follow up in 3 months  If not improved we will need to add more medicine   You should fix your own food  If the family eats things you shouldn't -then explain to them that you have to fix something else for your   I placed an orthopedic referral-our office will call you about that

## 2018-12-01 NOTE — Progress Notes (Signed)
Subjective:    Patient ID: Aaron Thompson, male    DOB: 14-Sep-1960, 58 y.o.   MRN: [id number]  HPI Here for f/u of chronic health problems  He has been staying away from public places to avoid covid    Wt Readings from Last 3 Encounters:  12/01/18 293 lb 4 oz (133 kg)  08/10/18 279 lb (126.6 kg)  05/16/18 288 lb (130.6 kg)  he is unsure if his wt gain is from fluid/heart failure - he goes up and down frequently  Unsure what dry wt is  35.70 kg/m  He is taking care of himself  Eats too many of the wrong things  He can occ mow a yard  Likes to be outdoors - wants to avoid sitting on the couch    CHF - he has to be cautious about increase in HR-makes him feel sick and goes out of rhythm Wonders when his device will need a new battery  He sends regular transmissions    bp is stable today  No cp or palpitations or headaches or edema  No side effects to medicines  BP Readings from Last 3 Encounters:  12/01/18 122/74  08/10/18 100/62  05/16/18 126/86      DM2 Lab Results  Component Value Date   HGBA1C 6.9 (H) 08/29/2018   due for re check today  Had previously lost wt and eaten better  He has gained wt/eating more bread/ potato (he does eat wheat bread)  Today :   Metformin xr and glipizide and jardiance  Taking entresto Intolerant to atorvastatin in the past   Lab Results  Component Value Date   HGBA1C 7.6 (A) 12/01/2018   Glucose 160s this am  Over 200 occ  He thinks he can control this if he eats better    Given flu shot today  Mood good PHQ: 0  Hyperlipidemia Lab Results  Component Value Date   CHOL 175 08/29/2018   HDL 42.10 08/29/2018   LDLCALC 108 (H) 08/29/2018   LDLDIRECT 97.0 06/16/2016   TRIG 121.0 08/29/2018   CHOLHDL 4 08/29/2018  atorvastatin caused body pain  He already has body pain-hesitant to try another statin    He takes tramadol prn for pain  He cannot take nsaids  Tylenol does not help that much  He sometimes takes both  tramadol and tylenol together   Last refill was 10/12/18 -had 60 pills   Has had 2 knee surgeries -knees are improved  Thinks he has arthritis  No joint swelling or heat  Back hurts when he does something  His L shoulder hurts-has had a cortisone shot  (both shoulders hurt)  L foot hurts -achey feeling   Has had neck surgery in the past  He has most pain in am- takes tramadol and gets to moving   Has not done PT or yoga or stretching  Has never had his pain checked out   Has been to Dr Doy Mince   Patient Active Problem List   Diagnosis Date Noted  . Shoulder pain, bilateral 12/01/2018  . History of colonic polyps   . Low back pain 09/07/2016  . Family history of prostate cancer 06/23/2016  . Frequent urination 06/23/2016  . Encounter for hepatitis C screening test for low risk patient 06/23/2016  . Encounter for screening for HIV 06/23/2016  . Colon polyps 06/23/2016  . Laboratory examination ordered as part of a routine general medical examination 03/08/2016  . Routine general medical examination at  a health care facility 03/08/2016  . Prostate cancer screening 03/08/2016  . A-fib ([place]) 10/14/2015  . Hypokalemia 08/12/2015  . Inappropriate shocks from ICD (implantable cardioverter-defibrillator) 03/17/2015  . Hearing loss of right ear due to cerumen impaction 03/13/2015  . Obesity (BMI 30-39.9) 12/05/2014  . Osteoarthritis of both hands 07/31/2014  . Low serum potassium level 04/21/2014  . ICD (implantable cardioverter-defibrillator) discharge 04/19/2014  . Chronic systolic heart failure ([place]) 02/14/2013  . Hyperlipidemia 02/05/2013  . Uncontrolled type 2 diabetes mellitus with circulatory disorder, without long-term current use of insulin ([place]) 09/30/2011  . Nonischemic cardiomyopathy ([place]) 08/18/2011  . Chest pain on exertion 08/17/2011  . Palpitations 05/12/2010  . CARDIOMYOPATHY OTHER DISEASES CLASSIFIED ELSW 07/16/2009  . OSTEOARTHROSIS UNSPEC WHETHER  GEN/LOCALIZED HAND 08/16/2008  . Essential hypertension 05/11/2007  . History of renal calculi 05/11/2007  . Fatty liver 03/20/2007  . ANXIETY 10/04/2006  . HEARING IMPAIRMENT 10/04/2006  . FOLLICULITIS 85/27/7824  . GERD 09/20/2006   Past Medical History:  Diagnosis Date  . AICD (automatic cardioverter/defibrillator) present   . Anxiety state, unspecified   . Calculus of kidney 1981   "passed it on my own"  . Chronic systolic CHF (congestive heart failure) ([place])   . Dermatophytosis of the body   . DJD (degenerative joint disease)   . Esophageal reflux   . Hx of colonic polyps   . Hypertension   . LBBB (left bundle branch block)    intermittent  . Non-ischemic cardiomyopathy ([place])    a. cath 2013: minor nonobstructive CAD.  . OSA on CPAP    pt can't use it.  . Osteoarthrosis, unspecified whether generalized or localized, hand   . Other chronic nonalcoholic liver disease    fatty liver  . PAF (paroxysmal atrial fibrillation) ([place])    a. s/p DCCV 6/11; previously on Pradaxa;  b. Event Monitor 2012->No PAF;  c. 09/2011 s/p DCCV ->Xarelto started. d. 03/2014: inappropriate ICD shocks for AF-RVR, started on Tikosyn, Xarelto restarted.  . Type II diabetes mellitus ([place])   . Unspecified hearing loss    no hearing aid   Past Surgical History:  Procedure Laterality Date  . Abdominal US  01/2007   Fatty liver, no gallstones  . APPENDECTOMY    . ATRIAL TACH ABLATION  09/2015  . BI-VENTRICULAR IMPLANTABLE CARDIOVERTER DEFIBRILLATOR [name] 02/14/2013   STJ CRTD implanted by Dr Caryl Comes  . BILATERAL KNEE ARTHROSCOPY    . CARDIOVERSION  09/29/2011   Procedure: CARDIOVERSION;  Surgeon: Thayer Headings, MD;  Location: [place];  Service: Cardiovascular;  Laterality: [name];  . COLONOSCOPY WITH PROPOFOL [name] 10/26/2016   Procedure: COLONOSCOPY WITH PROPOFOL;  Surgeon: Irene Shipper, MD;  Location: [facility];  Service: Endoscopy;  Laterality: [name];  . DOPPLER ECHOCARDIOGRAPHY  11/2009   Decreased EF of  35-40%  . ELECTROPHYSIOLOGIC STUDY [name] 10/14/2015   Procedure: Atrial Fibrillation Ablation;  Surgeon: [name], MD;  Location: [facility];  Service: Cardiovascular;  Laterality: [name];  . LEFT HEART CATHETERIZATION WITH CORONARY ANGIOGRAM [name] 08/18/2011   Procedure: LEFT HEART CATHETERIZATION WITH CORONARY ANGIOGRAM;  Surgeon: Peter M Martinique, MD;  Location: [facility] CATH LAB;  Service: Cardiovascular;  Laterality: [name];  . NECK SURGERY     plate/fusion  . Stress Cardiolite  08/2005   Normal, EF 42%  . UPPER GASTROINTESTINAL ENDOSCOPY  08/2006   GERD   Social History   Tobacco Use  . Smoking status: Never Smoker  . Smokeless tobacco: Former Systems developer  Types: Snuff  . Tobacco comment: 02/14/2013 "quit snuff in 2006"  Substance Use Topics  . Alcohol use: No    Alcohol/week: 0.0 standard drinks  . Drug use: No   Family History  Problem Relation Age of Onset  . Hypertension Mother   . Diabetes Mother   . Heart failure Mother        Died @ 67  . Hypertension Father   . Heart failure Father        Died @ 29  . Prostate cancer Father   . Diabetes Brother   . Kidney disease Brother   . Diabetes Brother   . Prostate cancer Brother   . Diabetes Brother   . Other Sister   . Heart attack Paternal Grandfather   . Stroke Paternal Grandmother   . Colon cancer Paternal Grandmother   . Multiple myeloma Sister   . Leukemia Sister   . Colon polyps Sister   . Cirrhosis Maternal Uncle        alcohol related  . Esophageal cancer Neg Hx   . Stomach cancer Neg Hx   . Pancreatic cancer Neg Hx    Allergies  Allergen Reactions  . Atorvastatin     Muscle pain    Current Outpatient Medications on File Prior to Visit  Medication Sig Dispense Refill  . carvedilol (COREG) 25 MG tablet TAKE 2 TABLETS(50 MG) BY MOUTH TWICE DAILY WITH A MEAL 360 tablet 1  . cetirizine (ZYRTEC) 10 MG tablet Take 10 mg by mouth daily as needed for allergies.    [name] dofetilide (TIKOSYN) 500 MCG capsule TAKE 1  CAPSULE(500 MCG) BY MOUTH TWICE DAILY 180 capsule 3  . glipiZIDE (GLIPIZIDE XL) 10 MG 24 hr tablet Take 1 tablet (10 mg total) by mouth daily with breakfast. 30 tablet 11  . glucose blood (ONE TOUCH ULTRA TEST) test strip CHECK GLUCOSE TWICE DAILY AND AS NEEDED, DX. E11.65 (UNCONTROLLED DM) 100 each 5  . JARDIANCE 10 MG TABS tablet TAKE 1 TABLET BY MOUTH DAILY 90 tablet 3  . metFORMIN (GLUCOPHAGE-XR) 500 MG 24 hr tablet TAKE 3 TABLETS(1500 MG) BY MOUTH DAILY WITH BREAKFAST 90 tablet 5  . Multiple Vitamin (MULTIVITAMIN) tablet Take 1 tablet by mouth daily.      [name] Bicarbonate (ZEGERID OTC) 20-1100 MG CAPS Take 1 capsule by mouth 2 (two) times daily.     . potassium chloride SA (K-DUR,KLOR-CON) 20 MEQ tablet Take 2 tablets (40 mEq total) by mouth 2 (two) times daily. 360 tablet 2  . sacubitril-valsartan (ENTRESTO) 97-103 MG Take 1 tablet by mouth 2 (two) times daily. Pt need to keep upcoming appt in June for further refills 180 tablet 2  . spironolactone (ALDACTONE) 25 MG tablet TAKE 1 TABLET(25 MG) BY MOUTH DAILY 90 tablet 2  . traMADol (ULTRAM) 50 MG tablet TAKE 1 TABLET(50 MG) BY MOUTH EVERY 12 HOURS AS NEEDED 60 tablet 0  . XARELTO 20 MG TABS tablet TAKE 1 TABLET(20 MG) BY MOUTH DAILY WITH SUPPER 30 tablet 5   No current facility-administered medications on file prior to visit.      Review of Systems  Constitutional: Negative for activity change, appetite change, fatigue, fever and unexpected weight change.  HENT: Negative for congestion, rhinorrhea, sore throat and trouble swallowing.   Eyes: Negative for pain, redness, itching and visual disturbance.  Respiratory: Negative for cough, chest tightness, shortness of breath and wheezing.   Cardiovascular: Negative for chest pain and palpitations.  Gastrointestinal: Negative for abdominal  pain, blood in stool, constipation, diarrhea and nausea.  Endocrine: Negative for cold intolerance, heat intolerance, polydipsia and  polyuria.  Genitourinary: Negative for difficulty urinating, dysuria, frequency and urgency.  Musculoskeletal: Positive for arthralgias and back pain. Negative for joint swelling and myalgias.  Skin: Negative for pallor and rash.  Neurological: Negative for dizziness, tremors, weakness, light-headedness, numbness and headaches.  Hematological: Negative for adenopathy. Does not bruise/bleed easily.  Psychiatric/Behavioral: Negative for decreased concentration and dysphoric mood. The patient is not nervous/anxious.        Objective:   Physical Exam Constitutional:      General: He is not in acute distress.    Appearance: Normal appearance. He is well-developed. He is obese. He is not ill-appearing or diaphoretic.  HENT:     Head: Normocephalic and atraumatic.  Eyes:     Conjunctiva/sclera: Conjunctivae normal.     Pupils: Pupils are equal, round, and reactive to light.  Neck:     Musculoskeletal: Normal range of motion and neck supple.     Thyroid: No thyromegaly.     Vascular: No carotid bruit or JVD.  Cardiovascular:     Rate and Rhythm: Normal rate and regular rhythm.     Heart sounds: Normal heart sounds. No gallop.   Pulmonary:     Effort: Pulmonary effort is normal. No respiratory distress.     Breath sounds: Normal breath sounds. No wheezing or rales.  Abdominal:     General: Bowel sounds are normal. There is no distension or abdominal bruit.     Palpations: Abdomen is soft. There is no mass.     Tenderness: There is no abdominal tenderness.  Musculoskeletal:        General: Tenderness present. No deformity.     Lumbar back: He exhibits decreased range of motion and tenderness. He exhibits no bony tenderness, no swelling, no deformity and normal pulse.     Right lower leg: No edema.     Left lower leg: No edema.     Comments: SLR on L causes back pain No L foot tenderness or swelling  Some loss of lordosis  Lymphadenopathy:     Cervical: No cervical adenopathy.  Skin:     General: Skin is warm and dry.     Findings: No rash.  Neurological:     Mental Status: He is alert.     Motor: No weakness.     Coordination: Coordination normal.     Deep Tendon Reflexes: Reflexes are normal and symmetric. Reflexes normal.  Psychiatric:        Mood and Affect: Mood normal.     Comments: Pleasant            Assessment & Plan:   Problem List Items Addressed This Visit      Cardiovascular and Mediastinum   Essential hypertension    bp in fair control at this time  BP Readings from Last 1 Encounters:  12/01/18 122/74   No changes needed Most recent labs reviewed  Disc lifstyle change with low sodium diet and exercise        Uncontrolled type 2 diabetes mellitus with circulatory disorder, without long-term current use of insulin ([place]) - Primary    Lab Results  Component Value Date   HGBA1C 7.6 (A) 12/01/2018   This is up due to poor eating Pt plans to return to DM diet (may have to eat differently from family)  Disc his capacity for exercise in light of CHF Continues metformin xr  and glipizide and jardiance  Intolerant to atorvastatin - ? If he would tolerate lower dose/ intermittent statin  Pt wants to work on diet and f/u 3 mo      Relevant Orders   HgB A1c (Completed)     Other   Hyperlipidemia    Disc goals for lipids and reasons to control them Rev last labs with pt Rev low sat fat diet in detail He is statin intol  May discuss low dose/intermittent tx in the future Now his chronic pain is too bad to attempt it      Obesity (BMI 30-39.9)    Discussed how this problem influences overall health and the risks it imposes  Reviewed plan for weight loss with lower calorie diet (via better food choices and also portion control or program like weight watchers) and exercise building up to or more than 30 minutes 5 days per week including some aerobic activity   Exercise is limited due to cardiac capacity      Low back pain    Ongoing  Requires tramadol for this Enc him to use only as needed  Unsure of etiology  Also has L foot pain-? Radiculopathy Ref to ortho for further eval      Relevant Orders   Ambulatory referral to Orthopedic Surgery   Shoulder pain, bilateral    Ongoing -has had inj in past  Takes tramadol  Ref to ortho      Relevant Orders   Ambulatory referral to Orthopedic Surgery    Other Visit Diagnoses    Need for influenza vaccination       Relevant Orders   Flu Vaccine QUAD 6+ mos PF IM (Fluarix Quad PF) (Completed)

## 2018-12-03 NOTE — Assessment & Plan Note (Addendum)
Lab Results  Component Value Date   HGBA1C 7.6 (A) 12/01/2018   This is up due to poor eating Pt plans to return to DM diet (may have to eat differently from family)  Disc his capacity for exercise in light of CHF Continues metformin xr and glipizide and jardiance  Intolerant to atorvastatin - ? If he would tolerate lower dose/ intermittent statin  Pt wants to work on diet and f/u 3 mo

## 2018-12-03 NOTE — Assessment & Plan Note (Signed)
bp in fair control at this time  BP Readings from Last 1 Encounters:  12/01/18 122/74   No changes needed Most recent labs reviewed  Disc lifstyle change with low sodium diet and exercise

## 2018-12-03 NOTE — Assessment & Plan Note (Signed)
Discussed how this problem influences overall health and the risks it imposes  Reviewed plan for weight loss with lower calorie diet (via better food choices and also portion control or program like weight watchers) and exercise building up to or more than 30 minutes 5 days per week including some aerobic activity   Exercise is limited due to cardiac capacity

## 2018-12-03 NOTE — Assessment & Plan Note (Signed)
Disc goals for lipids and reasons to control them Rev last labs with pt Rev low sat fat diet in detail He is statin intol  May discuss low dose/intermittent tx in the future Now his chronic pain is too bad to attempt it

## 2018-12-03 NOTE — Assessment & Plan Note (Signed)
Ongoing -has had inj in past  Takes tramadol  Ref to ortho

## 2018-12-03 NOTE — Assessment & Plan Note (Signed)
Ongoing Requires tramadol for this Enc him to use only as needed  Unsure of etiology  Also has L foot pain-? Radiculopathy Ref to ortho for further eval

## 2018-12-07 DIAGNOSIS — M545 Low back pain: Secondary | ICD-10-CM | POA: Diagnosis not present

## 2018-12-07 DIAGNOSIS — M25552 Pain in left hip: Secondary | ICD-10-CM | POA: Diagnosis not present

## 2018-12-08 ENCOUNTER — Other Ambulatory Visit: Payer: Self-pay | Admitting: Cardiovascular Disease

## 2018-12-08 MED ORDER — CARVEDILOL 25 MG PO TABS: ORAL_TABLET | ORAL | 2 refills | Status: DC

## 2018-12-15 ENCOUNTER — Ambulatory Visit (INDEPENDENT_AMBULATORY_CARE_PROVIDER_SITE_OTHER): Payer: PPO | Admitting: *Deleted

## 2018-12-15 DIAGNOSIS — I428 Other cardiomyopathies: Secondary | ICD-10-CM | POA: Diagnosis not present

## 2018-12-17 ENCOUNTER — Other Ambulatory Visit: Payer: Self-pay | Admitting: Family Medicine

## 2018-12-17 LAB — CUP PACEART REMOTE DEVICE CHECK
Battery Remaining Longevity: 6 mo
Battery Remaining Percentage: 9 %
Battery Voltage: 2.66 V
Brady Statistic AP VP Percent: 1 %
Brady Statistic AP VS Percent: 1 %
Brady Statistic AS VP Percent: 99 %
Brady Statistic AS VS Percent: 1 %
Brady Statistic RA Percent Paced: 1 %
Date Time Interrogation Session: 20201121064614
HighPow Impedance: 100 Ohm
HighPow Impedance: 100 Ohm
Implantable Lead Implant Date: 20150121
Implantable Lead Implant Date: 20150121
Implantable Lead Implant Date: 20150121
Implantable Lead Location: 753858
Implantable Lead Location: 753859
Implantable Lead Location: 753860
Implantable Lead Model: 7122
Implantable Pulse Generator Implant Date: 20150121
Lead Channel Impedance Value: 450 Ohm
Lead Channel Impedance Value: 580 Ohm
Lead Channel Impedance Value: 860 Ohm
Lead Channel Pacing Threshold Amplitude: 0.75 V
Lead Channel Pacing Threshold Amplitude: 1 V
Lead Channel Pacing Threshold Amplitude: 2 V
Lead Channel Pacing Threshold Pulse Width: 0.5 ms
Lead Channel Pacing Threshold Pulse Width: 0.5 ms
Lead Channel Pacing Threshold Pulse Width: 0.8 ms
Lead Channel Sensing Intrinsic Amplitude: 12 mV
Lead Channel Sensing Intrinsic Amplitude: 5 mV
Lead Channel Setting Pacing Amplitude: 1.75 V
Lead Channel Setting Pacing Amplitude: 2 V
Lead Channel Setting Pacing Amplitude: 3.5 V
Lead Channel Setting Pacing Pulse Width: 0.5 ms
Lead Channel Setting Pacing Pulse Width: 0.8 ms
Lead Channel Setting Sensing Sensitivity: 0.5 mV
Pulse Gen Serial Number: 7133975

## 2018-12-18 ENCOUNTER — Other Ambulatory Visit: Payer: Self-pay | Admitting: Pharmacist

## 2018-12-18 MED ORDER — RIVAROXABAN 20 MG PO TABS: ORAL_TABLET | ORAL | 5 refills | Status: DC

## 2018-12-18 NOTE — Progress Notes (Signed)
Age 58, weight 133kg, SCr 0.78 on 05/19/18, CrCl > 195mL/min, last OV July 2020, afib indication

## 2018-12-18 NOTE — Telephone Encounter (Signed)
Name of Medication: Tramadol Name of Pharmacy: [place] or Written Date and Quantity: 10/12/18 #60 tabs with 0 refills Last Office Visit and Type: CPE on 12/01/18 Next Office Visit and Type: 3 mo f/u on 03/06/19

## 2019-01-01 ENCOUNTER — Other Ambulatory Visit: Payer: Self-pay | Admitting: Cardiovascular Disease

## 2019-01-01 MED ORDER — ENTRESTO 97-103 MG PO TABS: 1.0000 | ORAL_TABLET | Freq: Two times a day (BID) | ORAL | 2 refills | Status: DC

## 2019-01-05 ENCOUNTER — Other Ambulatory Visit: Payer: Self-pay

## 2019-01-05 MED ORDER — GLUCOSE BLOOD VI STRP: ORAL_STRIP | 5 refills | Status: DC

## 2019-01-09 NOTE — Progress Notes (Signed)
Remote ICD transmission.   

## 2019-02-23 NOTE — Progress Notes (Signed)
Virtual Visit via Video Note    Evaluation Performed:  Follow-up visit  This visit type was conducted due to national recommendations for restrictions regarding the COVID-19 Pandemic (e.g. social distancing).  This format is felt to be most appropriate for this patient at this time.  All issues noted in this document were discussed and addressed.  No physical exam was performed .  Please refer to the patient's chart (MyChart message for video visits and phone note for telephone visits) for the patient's consent to telehealth for [facility].  Date:  02/23/2019   ID:  Aaron Thompson, DOB [birth year], MRN [id number]  Patient Location:  Home  Provider location:   Office  PCP:  Abner Greenspan, MD  Cardiologist:  [name] Electrophysiologist:  None   Chief Complaint:  F/U CHF   History of Present Illness:    Aaron Thompson is a 59 y.o. male  He has long standing PAF and CHF non ischemic. First diagnosed in 2013 Post AF ablation  2017 and on Tikosyn  , LBBB OSA unable to wear CPAP no history of CAD  On optimum Rx with beta blocker, entresto and diuretic Has AICD/CRT device as well Interrogation indicates BiV pacing over 95% of time and maintaining NSR. 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 Wants to do hospitality and work with trains   Feels great Now walking with son in morning   The patient does not have symptoms concerning for COVID-19 infection (fever, chills, cough, or new shortness of breath).    Prior CV studies:   The following studies were reviewed today:  Echo 02/02/18 see HPI EF 25-30% Notes from CHF clincic AICD Interrogations PACE-ART   Past Medical History:  Diagnosis Date  . AICD (automatic cardioverter/defibrillator) present   . Anxiety state, unspecified   . Calculus of kidney 1981   "passed it on my own"  . Chronic  systolic CHF (congestive heart failure) ([place])   . Dermatophytosis of the body   . DJD (degenerative joint disease)   . Esophageal reflux   . Hx of colonic polyps   . Hypertension   . LBBB (left bundle branch block)    intermittent  . Non-ischemic cardiomyopathy ([place])    a. cath 2013: minor nonobstructive CAD.  . OSA on CPAP    pt can't use it.  . Osteoarthrosis, unspecified whether generalized or localized, hand   . Other chronic nonalcoholic liver disease    fatty liver  . PAF (paroxysmal atrial fibrillation) ([place])    a. s/p DCCV 6/11; previously on Pradaxa;  b. Event Monitor 2012->No PAF;  c. 09/2011 s/p DCCV ->Xarelto started. d. 03/2014: inappropriate ICD shocks for AF-RVR, started on Tikosyn, Xarelto restarted.  . Type II diabetes mellitus ([place])   . Unspecified hearing loss    no hearing aid   Past Surgical History:  Procedure Laterality Date  . Abdominal US  01/2007   Fatty liver, no gallstones  . APPENDECTOMY    . ATRIAL TACH ABLATION  09/2015  . BI-VENTRICULAR IMPLANTABLE CARDIOVERTER DEFIBRILLATOR [name] 02/14/2013   STJ CRTD implanted by Dr Caryl Comes  . BILATERAL KNEE ARTHROSCOPY    . CARDIOVERSION  09/29/2011   Procedure: CARDIOVERSION;  Surgeon: Thayer Headings, MD;  Location: [place];  Service: Cardiovascular;  Laterality: [name];  . COLONOSCOPY WITH PROPOFOL [name] 10/26/2016   Procedure:  COLONOSCOPY WITH PROPOFOL;  Surgeon: Irene Shipper, MD;  Location: [facility];  Service: Endoscopy;  Laterality: [name];  . DOPPLER ECHOCARDIOGRAPHY  11/2009   Decreased EF of 35-40%  . ELECTROPHYSIOLOGIC STUDY [name] 10/14/2015   Procedure: Atrial Fibrillation Ablation;  Surgeon: [name], MD;  Location: [place] CV LAB;  Service: Cardiovascular;  Laterality: [name];  . LEFT HEART CATHETERIZATION WITH CORONARY ANGIOGRAM [name] 08/18/2011   Procedure: LEFT HEART CATHETERIZATION WITH CORONARY ANGIOGRAM;  Surgeon: Nadege Carriger M Martinique, MD;  Location: [facility] C[facility] LAB;  Service: Cardiovascular;  Laterality: [name];  .  NECK SURGERY     plate/fusion  . Stress Cardiolite  08/2005   Normal, EF 42%  . UPPER GASTROINTESTINAL ENDOSCOPY  08/2006   GERD     No outpatient medications have been marked as taking for the 03/02/19 encounter (Appointment) with [name], MD.     Allergies:   Atorvastatin   Social History   Tobacco Use  . Smoking status: Never Smoker  . Smokeless tobacco: Former Systems developer    Types: Snuff  . Tobacco comment: 02/14/2013 "quit snuff in 2006"  Substance Use Topics  . Alcohol use: No    Alcohol/week: 0.0 standard drinks  . Drug use: No     Family Hx: The patient's family history includes Cirrhosis in his maternal uncle; Colon cancer in his paternal grandmother; Colon polyps in his sister; Diabetes in his brother, brother, brother, and mother; Heart attack in his paternal grandfather; Heart failure in his father and mother; Hypertension in his father and mother; Kidney disease in his brother; Leukemia in his sister; Multiple myeloma in his sister; Other in his sister; Prostate cancer in his brother and father; Stroke in his paternal grandmother. There is no history of Esophageal cancer, Stomach cancer, or Pancreatic cancer.  ROS:   Please see the history of present illness.     All other systems reviewed and are negative.   Labs/Other Tests and Data Reviewed:    Recent Labs: 05/19/2018: ALT 16; BUN 22; Creatinine, Ser 0.78; Potassium 4.4; Sodium 141   Recent Lipid Panel Lab Results  Component Value Date/Time   CHOL 175 08/29/2018 09:07 AM   TRIG 121.0 08/29/2018 09:07 AM   HDL 42.10 08/29/2018 09:07 AM   CHOLHDL 4 08/29/2018 09:07 AM   LDLCALC 108 (H) 08/29/2018 09:07 AM   LDLDIRECT 97.0 06/16/2016 07:55 AM    Wt Readings from Last 3 Encounters:  12/01/18 293 lb 4 oz (133 kg)  08/10/18 279 lb (126.6 kg)  05/16/18 288 lb (130.6 kg)     Objective:    Vital Signs:  There were no vitals taken for this visit.   No distress Skin warm and dry Overweight white  male No JVP elevation  CRT/AICD under left clavicle Trace edema No tachypnea    ASSESSMENT & PLAN:    1.  CHF:  Non ischemic compliant with meds on maximum goal directed RX. Functional class 2 consider f/u echo ordered EF 25-30% by TTE 02/02/18  2. PAF:  Post ablation 2017 on tikosyn Mg and BMET normal  05/19/18  F/U EP 3. CRT-AICD:  No defibrillations Biv pacing over 95% of time stable f/u EP 4. OSA:  Consider f/u with dentist to try oral device cannot tolerate CPAP  Discussed weight loss 5. DM:  On jardiance f/u primary target A1C 6.5 or less 6. Anticoagulation No bleeding issues continue xarelto   COVID-19 Education: The signs and symptoms of COVID-19 were discussed with the patient and  how to seek care for testing (follow up with PCP or arrange E-visit).  The importance of social distancing was discussed today.  Patient Risk:   After full review of this patient's clinical status, I feel that they are at least moderate risk at this time.  Time:   Today, I have spent 30 minutes with the patient with telehealth technology discussing his chronic systolic CHF, AICD, PAF, anticoagulation , DM and future need for f/u TTE .     Medication Adjustments/Labs and Tests Ordered: Current medicines are reviewed at length with the patient today.  Concerns regarding medicines are outlined above.  Tests Ordered:  Echo for DCM  Medication Changes: No orders of the defined types were placed in this encounter.   Disposition:  Follow up in 6 months   Signed, Jenkins Rouge, MD  02/23/2019 1:11 PM    [place]

## 2019-03-01 ENCOUNTER — Telehealth: Payer: Self-pay

## 2019-03-01 NOTE — Telephone Encounter (Signed)
Tried to call patient about virtual visit tomorrow. Call could not be completed as dialed. Sent [employer].

## 2019-03-02 ENCOUNTER — Other Ambulatory Visit: Payer: Self-pay

## 2019-03-02 ENCOUNTER — Other Ambulatory Visit: Payer: PPO

## 2019-03-02 ENCOUNTER — Telehealth (INDEPENDENT_AMBULATORY_CARE_PROVIDER_SITE_OTHER): Payer: PPO | Admitting: Cardiovascular Disease

## 2019-03-02 ENCOUNTER — Encounter: Payer: Self-pay | Admitting: Cardiovascular Disease

## 2019-03-02 VITALS — BP 121/78 | HR 81 | Ht 76.0 in | Wt 285.0 lb

## 2019-03-02 DIAGNOSIS — I5022 Chronic systolic (congestive) heart failure: Secondary | ICD-10-CM

## 2019-03-02 NOTE — Patient Instructions (Addendum)
Medication Instructions:   *If you need a refill on your cardiac medications before your next appointment, please call your pharmacy*  Lab Work:  If you have labs (blood work) drawn today and your tests are completely normal, you will receive your results only by: . MyChart Message (if you have MyChart) OR . A paper copy in the mail If you have any lab test that is abnormal or we need to change your treatment, we will call you to review the results.   Follow-Up: At [facility], you and your health needs are our priority.  As part of our continuing mission to provide you with exceptional heart care, we have created designated Provider Care Teams.  These Care Teams include your primary Cardiologist (physician) and Advanced Practice Providers (APPs -  Physician Assistants and Nurse Practitioners) who all work together to provide you with the care you need, when you need it.  Your next appointment:   6 month(s)  The format for your next appointment:   In Person  Provider:   You may see Dr. Nishan or one of the following Advanced Practice Providers on your designated Care Team:    Lori Gerhardt, NP  Laura Ingold, NP  Jill McDaniel, NP    

## 2019-03-05 ENCOUNTER — Other Ambulatory Visit: Payer: Self-pay | Admitting: Cardiovascular Disease

## 2019-03-06 ENCOUNTER — Ambulatory Visit: Payer: PPO | Admitting: Family Medicine

## 2019-03-16 ENCOUNTER — Ambulatory Visit (INDEPENDENT_AMBULATORY_CARE_PROVIDER_SITE_OTHER): Payer: PPO | Admitting: *Deleted

## 2019-03-16 DIAGNOSIS — I428 Other cardiomyopathies: Secondary | ICD-10-CM

## 2019-03-20 LAB — CUP PACEART REMOTE DEVICE CHECK
Battery Remaining Longevity: 3 mo
Battery Remaining Percentage: 4 %
Battery Voltage: 2.62 V
Brady Statistic AP VP Percent: 1 %
Brady Statistic AP VS Percent: 1 %
Brady Statistic AS VP Percent: 98 %
Brady Statistic AS VS Percent: 1 %
Brady Statistic RA Percent Paced: 1 %
Date Time Interrogation Session: 20210222131045
HighPow Impedance: 93 Ohm
HighPow Impedance: 93 Ohm
Implantable Lead Implant Date: 20150121
Implantable Lead Implant Date: 20150121
Implantable Lead Implant Date: 20150121
Implantable Lead Location: 753858
Implantable Lead Location: 753859
Implantable Lead Location: 753860
Implantable Lead Model: 7122
Implantable Pulse Generator Implant Date: 20150121
Lead Channel Impedance Value: 450 Ohm
Lead Channel Impedance Value: 640 Ohm
Lead Channel Impedance Value: 890 Ohm
Lead Channel Pacing Threshold Amplitude: 0.75 V
Lead Channel Pacing Threshold Amplitude: 1 V
Lead Channel Pacing Threshold Amplitude: 2 V
Lead Channel Pacing Threshold Pulse Width: 0.5 ms
Lead Channel Pacing Threshold Pulse Width: 0.5 ms
Lead Channel Pacing Threshold Pulse Width: 0.8 ms
Lead Channel Sensing Intrinsic Amplitude: 12 mV
Lead Channel Sensing Intrinsic Amplitude: 5 mV
Lead Channel Setting Pacing Amplitude: 1.75 V
Lead Channel Setting Pacing Amplitude: 2 V
Lead Channel Setting Pacing Amplitude: 3.5 V
Lead Channel Setting Pacing Pulse Width: 0.5 ms
Lead Channel Setting Pacing Pulse Width: 0.8 ms
Lead Channel Setting Sensing Sensitivity: 0.5 mV
Pulse Gen Serial Number: 7133975

## 2019-03-20 NOTE — Progress Notes (Signed)
ICD Remote  

## 2019-03-26 ENCOUNTER — Telehealth: Payer: Self-pay | Admitting: Family Medicine

## 2019-03-26 NOTE — Telephone Encounter (Signed)
That is fine with me if fine with Dr [name]

## 2019-03-26 NOTE — Telephone Encounter (Signed)
Spouse called Aaron Thompson [birth year].  Wanting to tranfer care for Aaron Thompson from dr tower to dr [name].  She wanted her family to have same provider.  Aaron Thompson stated she had already talked to dr [name] out this.  She wanted to schedule dm follow up  [facility] to transfer care

## 2019-03-27 NOTE — Telephone Encounter (Signed)
Agreed.  Please make the change in the chart.  Needs 30-minute office visit when possible.

## 2019-03-28 NOTE — Telephone Encounter (Signed)
3/12 pt aware

## 2019-04-06 ENCOUNTER — Ambulatory Visit (INDEPENDENT_AMBULATORY_CARE_PROVIDER_SITE_OTHER): Payer: PPO | Admitting: Family Medicine

## 2019-04-06 ENCOUNTER — Encounter: Payer: Self-pay | Admitting: Family Medicine

## 2019-04-06 ENCOUNTER — Other Ambulatory Visit: Payer: Self-pay

## 2019-04-06 VITALS — BP 110/70 | HR 84 | Temp 96.9°F | Ht 76.0 in | Wt 296.6 lb

## 2019-04-06 DIAGNOSIS — M545 Low back pain, unspecified: Secondary | ICD-10-CM

## 2019-04-06 DIAGNOSIS — Z7189 Other specified counseling: Secondary | ICD-10-CM | POA: Diagnosis not present

## 2019-04-06 DIAGNOSIS — Z125 Encounter for screening for malignant neoplasm of prostate: Secondary | ICD-10-CM | POA: Diagnosis not present

## 2019-04-06 DIAGNOSIS — E1159 Type 2 diabetes mellitus with other circulatory complications: Secondary | ICD-10-CM | POA: Diagnosis not present

## 2019-04-06 DIAGNOSIS — IMO0002 Reserved for concepts with insufficient information to code with codable children: Secondary | ICD-10-CM

## 2019-04-06 DIAGNOSIS — I5022 Chronic systolic (congestive) heart failure: Secondary | ICD-10-CM | POA: Diagnosis not present

## 2019-04-06 DIAGNOSIS — E1165 Type 2 diabetes mellitus with hyperglycemia: Secondary | ICD-10-CM | POA: Diagnosis not present

## 2019-04-06 DIAGNOSIS — E7800 Pure hypercholesterolemia, unspecified: Secondary | ICD-10-CM | POA: Diagnosis not present

## 2019-04-06 LAB — COMPREHENSIVE METABOLIC PANEL
ALT: 17 U/L (ref 0–53)
AST: 14 U/L (ref 0–37)
Albumin: 4.2 g/dL (ref 3.5–5.2)
Alkaline Phosphatase: 52 U/L (ref 39–117)
BUN: 18 mg/dL (ref 6–23)
CO2: 28 mEq/L (ref 19–32)
Calcium: 9.2 mg/dL (ref 8.4–10.5)
Chloride: 102 mEq/L (ref 96–112)
Creatinine, Ser: 0.69 mg/dL (ref 0.40–1.50)
GFR: 117.99 mL/min (ref >60.00)
Glucose, Bld: 151 mg/dL — ABNORMAL HIGH (ref 70–99)
Potassium: 4.7 mEq/L (ref 3.5–5.1)
Sodium: 137 mEq/L (ref 135–145)
Total Bilirubin: 0.6 mg/dL (ref 0.2–1.2)
Total Protein: 6.6 g/dL (ref 6.0–8.3)

## 2019-04-06 LAB — CBC WITH DIFFERENTIAL/PLATELET
Basophils Absolute: 0.1 10*3/uL (ref 0.0–0.1)
Basophils Relative: 1 % (ref 0.0–3.0)
Eosinophils Absolute: 0.2 10*3/uL (ref 0.0–0.7)
Eosinophils Relative: 2.5 % (ref 0.0–5.0)
HCT: 46.7 % (ref 39.0–52.0)
Hemoglobin: 15.7 g/dL (ref 13.0–17.0)
Lymphocytes Relative: 26.1 % (ref 12.0–46.0)
Lymphs Abs: 1.8 10*3/uL (ref 0.7–4.0)
MCHC: 33.7 g/dL (ref 30.0–36.0)
MCV: 85.6 fl (ref 78.0–100.0)
Monocytes Absolute: 0.6 10*3/uL (ref 0.1–1.0)
Monocytes Relative: 8.4 % (ref 3.0–12.0)
Neutro Abs: 4.4 10*3/uL (ref 1.4–7.7)
Neutrophils Relative %: 62 % (ref 43.0–77.0)
Platelets: 180 10*3/uL (ref 150.0–400.0)
RBC: 5.45 Mil/uL (ref 4.22–5.81)
RDW: 13.9 % (ref 11.5–15.5)
WBC: 7.1 10*3/uL (ref 4.0–10.5)

## 2019-04-06 LAB — LIPID PANEL
Cholesterol: 176 mg/dL (ref 0–200)
HDL: 38.2 mg/dL — ABNORMAL LOW (ref >39.00)
LDL Cholesterol: 99 mg/dL (ref 0–99)
NonHDL: 137.38
Total CHOL/HDL Ratio: 5
Triglycerides: 191 mg/dL — ABNORMAL HIGH (ref 0.0–149.0)
VLDL: 38.2 mg/dL (ref 0.0–40.0)

## 2019-04-06 LAB — HEMOGLOBIN A1C: Hgb A1c MFr Bld: 7.6 % — ABNORMAL HIGH (ref 4.6–6.5)

## 2019-04-06 LAB — PSA: PSA: 2.62 ng/mL (ref 0.10–4.00)

## 2019-04-06 MED ORDER — TRAMADOL HCL 50 MG PO TABS: ORAL_TABLET | ORAL | 2 refills | Status: DC

## 2019-04-06 NOTE — Patient Instructions (Addendum)
Check to see what medicine your brother could take for cholesterol and let me know.  Go to the lab on the way out.   If you have mychart we'll likely use that to update you.    Take tylenol with tramadol and update me as needed.  Take care.  Glad to see you.

## 2019-04-06 NOTE — Progress Notes (Signed)
This visit occurred during the SARS-CoV-2 public health emergency.  Safety protocols were in place, including screening questions prior to the visit, additional usage of staff PPE, and extensive cleaning of exam room while observing appropriate contact time as indicated for disinfecting solutions.  Diabetes:  Using medications without difficulties: yes Hypoglycemic episodes:not unless prolonged fasting.   Hyperglycemic episodes: no Feet problems: some occ numbness.   Blood Sugars averaging: 150-170, higher in the AMs, lower later in the day.   eye exam within last year: d/w pt.  Pending for next week.  Prev statin intolerance.  His brother had statin intolerance but was able to take Livalo.  CHF.  Seen by cardiology.  He is going to f/u about his pacemaker.   Still anticoagulated.  No CP.  No bleeding.  No BLE edema.   covid vaccine d/w pt.   PSA screening d/w pt.  See notes on labs.  FH noted.   He had joint aches at baseline, esp L hand, his back, R knee.  He has been walking more in the meantime, had gotten up to 1/2 mile at a time.  He was going to try to play golf recently, with some of it uphill.  He could only get through 9 holes, using the cart. He had to limit his round due to pain in his joints.  Tramadol with tylenol helps better, he usually some relief with BID dosing.  He has more pain on standing after he has been sitting for a while.  He has DJD in his neck at baseline.  He has prev CT with multilevel degenerative changes of the L spine.    Wife designated if patient were incapacitated.    PMH and SH reviewed  Allergies as of 04/06/2019      Reactions   Atorvastatin    Muscle pain       Medication List       Accurate as of April 06, 2019 11:59 PM. If you have any questions, ask your nurse or doctor.        carvedilol 25 MG tablet Commonly known as: COREG TAKE 2 TABLETS(50 MG) BY MOUTH TWICE DAILY WITH A MEAL   cetirizine 10 MG tablet Commonly known as: ZYRTEC  Take 10 mg by mouth daily as needed for allergies.   dofetilide 500 MCG capsule Commonly known as: TIKOSYN TAKE 1 CAPSULE(500 MCG) BY MOUTH TWICE DAILY   Entresto 97-103 MG Generic drug: sacubitril-valsartan Take 1 tablet by mouth 2 (two) times daily. Pt need to keep upcoming appt in June for further refills   glipiZIDE 10 MG 24 hr tablet Commonly known as: glipiZIDE XL Take 1 tablet (10 mg total) by mouth daily with breakfast.   glucose blood test strip Commonly known as: ONE TOUCH ULTRA TEST CHECK GLUCOSE TWICE DAILY AND AS NEEDED, DX. E11.65 (UNCONTROLLED DM)   Jardiance 10 MG Tabs tablet Generic drug: empagliflozin TAKE 1 TABLET BY MOUTH DAILY   metFORMIN 500 MG 24 hr tablet Commonly known as: GLUCOPHAGE-XR TAKE 3 TABLETS(1500 MG) BY MOUTH DAILY WITH BREAKFAST   multivitamin tablet Take 1 tablet by mouth daily.   potassium chloride SA 20 MEQ tablet Commonly known as: KLOR-CON Take 2 tablets (40 mEq total) by mouth 2 (two) times daily.   rivaroxaban 20 MG Tabs tablet Commonly known as: Xarelto TAKE 1 TABLET(20 MG) BY MOUTH DAILY WITH SUPPER   spironolactone 25 MG tablet Commonly known as: ALDACTONE TAKE 1 TABLET(25 MG) BY MOUTH DAILY   traMADol  50 MG tablet Commonly known as: ULTRAM TAKE 1 TABLET(50 MG) BY MOUTH EVERY 12 HOURS AS NEEDED FOR PAIN What changed: additional instructions Changed by: Elsie Stain, MD   Zegerid OTC 20-1100 MG Caps capsule Generic drug: Omeprazole-Sodium Bicarbonate Take 1 capsule by mouth 2 (two) times daily.        Meds, vitals, and allergies reviewed.   ROS: Per HPI unless specifically indicated in ROS section   GEN: nad, alert and oriented HEENT: ncat NECK: supple w/o LA CV: rrr. PULM: ctab, no inc wob ABD: soft, +bs EXT: no edema SKIN: no acute rash Chronic IP joint changes.   He walks with a limp when he gets up from prolonged sitting.  Diabetic foot exam: Normal inspection No skin breakdown No calluses   Normal DP pulses Normal sensation to light touch and monofilament Nails normal

## 2019-04-08 ENCOUNTER — Other Ambulatory Visit: Payer: Self-pay | Admitting: Family Medicine

## 2019-04-08 MED ORDER — LIVALO 1 MG PO TABS: 1.0000 mg | ORAL_TABLET | Freq: Every day | ORAL | 1 refills | Status: DC

## 2019-04-08 NOTE — Assessment & Plan Note (Signed)
Wife designated if patient were incapacitated.  

## 2019-04-08 NOTE — Assessment & Plan Note (Signed)
Tramadol with tylenol helps better, he usually some relief with BID dosing.  He has more pain on standing after he has been sitting for a while.  He has DJD in his neck at baseline.  He has prev CT with multilevel degenerative changes of the L spine.   Reasonable to continue as needed tramadol along with Tylenol.  Update me as needed.  Routine cautions given to patient.  At least 30 minutes were devoted to patient care in this encounter (this can potentially include time spent reviewing the patient's file/history, interviewing and examining the patient, counseling/reviewing plan with patient, ordering referrals, ordering tests, reviewing relevant laboratory or x-ray data, and documenting the encounter).

## 2019-04-08 NOTE — Assessment & Plan Note (Signed)
Seen by cardiology.  He is going to f/u about his pacemaker.   Still anticoagulated.  No CP.  No bleeding.  No BLE edema.  Continue rifaximin, potassium, spironolactone, carvedilol, etc.

## 2019-04-08 NOTE — Assessment & Plan Note (Signed)
Prev statin intolerance.  His brother had statin intolerance but was able to take Livalo.  See notes on labs.

## 2019-04-08 NOTE — Assessment & Plan Note (Signed)
PSA screening d/w pt.  See notes on labs.  FH noted.

## 2019-04-08 NOTE — Assessment & Plan Note (Signed)
Continue Metformin and glipizide.  See notes on labs.  He has eye exam follow-up pending.

## 2019-04-10 ENCOUNTER — Encounter: Payer: Self-pay | Admitting: Family Medicine

## 2019-04-13 MED ORDER — LOVASTATIN 40 MG PO TABS: 40.0000 mg | ORAL_TABLET | Freq: Every day | ORAL | 6 refills | Status: DC

## 2019-04-18 NOTE — Telephone Encounter (Signed)
Follow Up   Claudie Leach from [employer] is calling in about the prior authorization for medication Entresto. Please give Claudie Leach a call back at [phone] (option 3) to assist.

## 2019-04-19 ENCOUNTER — Telehealth: Payer: Self-pay

## 2019-04-19 NOTE — Telephone Encounter (Signed)
**Note De-Identified Arush Gatliff Obfuscation** Jefm Miles G routed conversation to You 18 hours ago (3:31 PM)  Jefm Miles G 18 hours ago (3:31 PM)  BL   Follow Up   Claudie Leach from [employer] is calling in about the prior authorization for medication Entresto. Please give Claudie Leach a call back at [phone] (option 3) to assist.       Documentation

## 2019-04-19 NOTE — Telephone Encounter (Signed)
**Note De-Identified Cordaro Mukai Obfuscation** I called [employer] back and did an Entresto tier exception over the phone with Tillie Rung. Per Tillie Rung she has submitted this tier ex for review and they will fax [place] their determination once reached. Case#: [id number]

## 2019-04-22 LAB — CUP PACEART REMOTE DEVICE CHECK
Battery Remaining Longevity: 0 mo
Battery Voltage: 2.59 V
Brady Statistic AP VP Percent: 1 %
Brady Statistic AP VS Percent: 1 %
Brady Statistic AS VP Percent: 98 %
Brady Statistic AS VS Percent: 1 %
Brady Statistic RA Percent Paced: 1 %
Date Time Interrogation Session: 20210327184321
HighPow Impedance: 90 Ohm
HighPow Impedance: 90 Ohm
Implantable Lead Implant Date: 20150121
Implantable Lead Implant Date: 20150121
Implantable Lead Implant Date: 20150121
Implantable Lead Location: 753858
Implantable Lead Location: 753859
Implantable Lead Location: 753860
Implantable Lead Model: 7122
Implantable Pulse Generator Implant Date: 20150121
Lead Channel Impedance Value: 450 Ohm
Lead Channel Impedance Value: 610 Ohm
Lead Channel Impedance Value: 850 Ohm
Lead Channel Pacing Threshold Amplitude: 0.75 V
Lead Channel Pacing Threshold Amplitude: 1.125 V
Lead Channel Pacing Threshold Amplitude: 2 V
Lead Channel Pacing Threshold Pulse Width: 0.5 ms
Lead Channel Pacing Threshold Pulse Width: 0.5 ms
Lead Channel Pacing Threshold Pulse Width: 0.8 ms
Lead Channel Sensing Intrinsic Amplitude: 5 mV
Lead Channel Sensing Intrinsic Amplitude: 9.5 mV
Lead Channel Setting Pacing Amplitude: 1.75 V
Lead Channel Setting Pacing Amplitude: 2.125
Lead Channel Setting Pacing Amplitude: 3.5 V
Lead Channel Setting Pacing Pulse Width: 0.5 ms
Lead Channel Setting Pacing Pulse Width: 0.8 ms
Lead Channel Setting Sensing Sensitivity: 0.5 mV
Pulse Gen Serial Number: 7133975

## 2019-04-23 ENCOUNTER — Telehealth: Payer: Self-pay

## 2019-04-23 ENCOUNTER — Ambulatory Visit (INDEPENDENT_AMBULATORY_CARE_PROVIDER_SITE_OTHER): Payer: PPO | Admitting: *Deleted

## 2019-04-23 DIAGNOSIS — I428 Other cardiomyopathies: Secondary | ICD-10-CM | POA: Diagnosis not present

## 2019-04-23 NOTE — Telephone Encounter (Signed)
Patient reached ERI on 04/21/19. Patient contacted clinic after Dr Hermelinda Medicus sent myChart message. Patient informed he will be scheduled for follow up with Dr Caryl Comes to discuss generator change. Will send to scheduler to have placed on Dr Olin Pia calendar.

## 2019-04-23 NOTE — Telephone Encounter (Signed)
**Note De-Identified Frans Valente Obfuscation** Letter received from [facility] stating that they approved the pts Entresto. Approval good until 01/25/20

## 2019-04-23 NOTE — Progress Notes (Signed)
ICD Remote  

## 2019-04-23 NOTE — Telephone Encounter (Signed)
The pt states he talk to [employer] and he has reached ERI. Pt was advised to call the device clinic.

## 2019-04-26 ENCOUNTER — Telehealth (INDEPENDENT_AMBULATORY_CARE_PROVIDER_SITE_OTHER): Payer: PPO | Admitting: Internal Medicine

## 2019-04-26 ENCOUNTER — Other Ambulatory Visit: Payer: Self-pay

## 2019-04-26 ENCOUNTER — Telehealth: Payer: Self-pay

## 2019-04-26 VITALS — BP 130/82 | HR 86 | Wt 290.0 lb

## 2019-04-26 DIAGNOSIS — I5022 Chronic systolic (congestive) heart failure: Secondary | ICD-10-CM

## 2019-04-26 DIAGNOSIS — I4819 Other persistent atrial fibrillation: Secondary | ICD-10-CM

## 2019-04-26 NOTE — Telephone Encounter (Signed)
  Patient Consent for Virtual Visit         Aaron Thompson has provided verbal consent on 04/26/2019 for a virtual visit (video or telephone).   CONSENT FOR VIRTUAL VISIT FOR:  Aaron Thompson  By participating in this virtual visit I agree to the following:  I hereby voluntarily request, consent and authorize [facility] and its employed or contracted physicians, physician assistants, nurse practitioners or other licensed health care professionals (the Practitioner), to provide me with telemedicine health care services (the "Services") as deemed necessary by the treating Practitioner. I acknowledge and consent to receive the Services by the Practitioner via telemedicine. I understand that the telemedicine visit will involve communicating with the Practitioner through live audiovisual communication technology and the disclosure of certain medical information by electronic transmission. I acknowledge that I have been given the opportunity to request an in-person assessment or other available alternative prior to the telemedicine visit and am voluntarily participating in the telemedicine visit.  I understand that I have the right to withhold or withdraw my consent to the use of telemedicine in the course of my care at any time, without affecting my right to future care or treatment, and that the Practitioner or I may terminate the telemedicine visit at any time. I understand that I have the right to inspect all information obtained and/or recorded in the course of the telemedicine visit and may receive copies of available information for a reasonable fee.  I understand that some of the potential risks of receiving the Services via telemedicine include:  [name] Delay or interruption in medical evaluation due to technological equipment failure or disruption; . Information transmitted may not be sufficient (e.g. poor resolution of images) to allow for appropriate medical decision making by the Practitioner;  and/or  . In rare instances, security protocols could fail, causing a breach of personal health information.  Furthermore, I acknowledge that it is my responsibility to provide information about my medical history, conditions and care that is complete and accurate to the best of my ability. I acknowledge that Practitioner's advice, recommendations, and/or decision may be based on factors not within their control, such as incomplete or inaccurate data provided by me or distortions of diagnostic images or specimens that may result from electronic transmissions. I understand that the practice of medicine is not an exact science and that Practitioner makes no warranties or guarantees regarding treatment outcomes. I acknowledge that a copy of this consent can be made available to me via my patient portal ([facility]), or I can request a printed copy by calling the office of [facility].    I understand that my insurance will be billed for this visit.   I have read or had this consent read to me. . I understand the contents of this consent, which adequately explains the benefits and risks of the Services being provided via telemedicine.  . I have been provided ample opportunity to ask questions regarding this consent and the Services and have had my questions answered to my satisfaction. . I give my informed consent for the services to be provided through the use of telemedicine in my medical care

## 2019-04-26 NOTE — Progress Notes (Signed)
Electrophysiology TeleHealth Note   Due to national recommendations of social distancing due to COVID 19, an audio/video telehealth visit is felt to be most appropriate for this patient at this time.  See MyChart message from today for the patient's consent to telehealth for [facility].   Date:  04/26/2019   ID:  Aaron Thompson, DOB 05-Jul-1960, MRN [id number]  Location: patient's home  Provider location: [address] [place]  Evaluation Performed: Follow-up visit  PCP:  [name], MD  Cardiologist:     Electrophysiologist:  SK   Chief Complaint:  iCD at ERI   History of Present Illness:      Aaron Thompson is a 59 y.o. male who presents via audio/video conferencing for a telehealth visit today.  Since last being seen in our clinic  for CRT- ICD implanted for nonischemic cardiomyopathy and left bundle branch block.  He was noted on  his device to have atrial fibrillation and he was started on Rivaroxaban for stroke reduction prophylaxis   Chose to stop his anticoagulation and to return to work. March 2016 he had ICD shocks associated with very rapid atrial fibrillation.  He is now on dofetilide therapy as well as back on Rivaroxaban   He underwent catheter ablation of his atrial fibrillation (JA) 9/17      DATE TEST EF   9/17 Echo  40 %   11/18  Echo   20-25%    1/20  Echo  25-30%    , the patient reports *doing better  Walking 1/2 mile ; now disabled from [place] at ERI--  The patient denies chest pain,  nocturnal dyspnea , orthopnea * or peripheral edema .  There have been no palpitation , lightheadedness or syncope, he has chronic DOE.    Date Cr K Hgb  3/21 0.69 4.7 15.7           The patient denies symptoms of fevers, chills, cough, or new SOB worrisome for COVID 19.    Past Medical History:  Diagnosis Date  . AICD (automatic cardioverter/defibrillator) present   . Anxiety state, unspecified   .  Calculus of kidney 1981   "passed it on my own"  . Chronic systolic CHF (congestive heart failure) ([place])   . Dermatophytosis of the body   . DJD (degenerative joint disease)   . Esophageal reflux   . Hx of colonic polyps   . Hypertension   . LBBB (left bundle branch block)    intermittent  . Non-ischemic cardiomyopathy ([place])    a. cath 2013: minor nonobstructive CAD.  [name] OSA (obstructive sleep apnea)    he can't tolerate CPAP.   [name] Osteoarthrosis, unspecified whether generalized or localized, hand   . Other chronic nonalcoholic liver disease    fatty liver  . PAF (paroxysmal atrial fibrillation) ([place])    a. s/p DCCV 6/11; previously on Pradaxa;  b. Event Monitor 2012->No PAF;  c. 09/2011 s/p DCCV ->Xarelto started. d. 03/2014: inappropriate ICD shocks for AF-RVR, started on Tikosyn, Xarelto restarted.  . Type II diabetes mellitus ([place])   . Unspecified hearing loss    no hearing aid    Past Surgical History:  Procedure Laterality Date  . Abdominal US  01/2007   Fatty liver, no gallstones  . APPENDECTOMY    . ATRIAL TACH ABLATION  09/2015  . BI-VENTRICULAR IMPLANTABLE CARDIOVERTER DEFIBRILLATOR [name] 02/14/2013   STJ CRTD implanted by Dr Caryl Comes  .  BILATERAL KNEE ARTHROSCOPY    . CARDIOVERSION  09/29/2011   Procedure: CARDIOVERSION;  Surgeon: Thayer Headings, MD;  Location: [facility];  Service: Cardiovascular;  Laterality: [name];  . COLONOSCOPY WITH PROPOFOL [name] 10/26/2016   Procedure: COLONOSCOPY WITH PROPOFOL;  Surgeon: Irene Shipper, MD;  Location: [facility];  Service: Endoscopy;  Laterality: [name];  . DOPPLER ECHOCARDIOGRAPHY  11/2009   Decreased EF of 35-40%  . ELECTROPHYSIOLOGIC STUDY [name] 10/14/2015   Procedure: Atrial Fibrillation Ablation;  Surgeon: [name], MD;  Location: [facility];  Service: Cardiovascular;  Laterality: [name];  . LEFT HEART CATHETERIZATION WITH CORONARY ANGIOGRAM [name] 08/18/2011   Procedure: LEFT HEART CATHETERIZATION WITH CORONARY ANGIOGRAM;  Surgeon: Peter  M Martinique, MD;  Location: [facility] CATH LAB;  Service: Cardiovascular;  Laterality: [name];  . NECK SURGERY     plate/fusion  . Stress Cardiolite  08/2005   Normal, EF 42%  . UPPER GASTROINTESTINAL ENDOSCOPY  08/2006   GERD    Current Outpatient Medications  Medication Sig Dispense Refill  . carvedilol (COREG) 25 MG tablet TAKE 2 TABLETS(50 MG) BY MOUTH TWICE DAILY WITH A MEAL 360 tablet 2  . cetirizine (ZYRTEC) 10 MG tablet Take 10 mg by mouth daily as needed for allergies.    [name] dofetilide (TIKOSYN) 500 MCG capsule TAKE 1 CAPSULE(500 MCG) BY MOUTH TWICE DAILY 180 capsule 3  . glipiZIDE (GLIPIZIDE XL) 10 MG 24 hr tablet Take 1 tablet (10 mg total) by mouth daily with breakfast. 30 tablet 11  . glucose blood (ONE TOUCH ULTRA TEST) test strip CHECK GLUCOSE TWICE DAILY AND AS NEEDED, DX. E11.65 (UNCONTROLLED DM) 100 each 5  . JARDIANCE 10 MG TABS tablet TAKE 1 TABLET BY MOUTH DAILY 90 tablet 3  . lovastatin (MEVACOR) 40 MG tablet Take 1 tablet (40 mg total) by mouth at bedtime. 30 tablet 6  . metFORMIN (GLUCOPHAGE-XR) 500 MG 24 hr tablet TAKE 3 TABLETS(1500 MG) BY MOUTH DAILY WITH BREAKFAST 90 tablet 5  . Multiple Vitamin (MULTIVITAMIN) tablet Take 1 tablet by mouth daily.      [name] Bicarbonate (ZEGERID OTC) 20-1100 MG CAPS Take 1 capsule by mouth 2 (two) times daily.     . potassium chloride SA (K-DUR,KLOR-CON) 20 MEQ tablet Take 2 tablets (40 mEq total) by mouth 2 (two) times daily. (Patient taking differently: Take 40 mEq by mouth daily. ) 360 tablet 2  . rivaroxaban (XARELTO) 20 MG TABS tablet TAKE 1 TABLET(20 MG) BY MOUTH DAILY WITH SUPPER 30 tablet 5  . sacubitril-valsartan (ENTRESTO) 97-103 MG Take 1 tablet by mouth 2 (two) times daily. Pt need to keep upcoming appt in June for further refills 180 tablet 2  . spironolactone (ALDACTONE) 25 MG tablet TAKE 1 TABLET(25 MG) BY MOUTH DAILY 90 tablet 2  . traMADol (ULTRAM) 50 MG tablet TAKE 1 TABLET(50 MG) BY MOUTH EVERY 12 HOURS AS NEEDED  FOR PAIN 60 tablet 2   No current facility-administered medications for this visit.    Allergies:   Atorvastatin   Social History:  The patient  reports that he has never smoked. He quit smokeless tobacco use about 14 years ago.  His smokeless tobacco use included snuff. He reports that he does not drink alcohol or use drugs.   Family History:  The patient's   family history includes Cirrhosis in his maternal uncle; Colon cancer in his paternal grandmother; Colon polyps in his sister; Diabetes in his brother, brother, brother, and mother; Heart attack in his paternal  grandfather; Heart failure in his father and mother; Hypertension in his father and mother; Kidney disease in his brother; Leukemia in his sister; Multiple myeloma in his sister; Other in his sister; Prostate cancer in his brother and father; Stroke in his paternal grandmother.   ROS:  Please see the history of present illness.   All other systems are personally reviewed and negative.    Exam:    Vital Signs:  BP 130/82   Pulse 86   Wt 290 lb (131.5 kg)   BMI 35.30 kg/m         Labs/Other Tests and Data Reviewed:    Recent Labs: 04/06/2019: ALT 17; BUN 18; Creatinine, Ser 0.69; Hemoglobin 15.7; Platelets 180.0; Potassium 4.7; Sodium 137   Wt Readings from Last 3 Encounters:  04/26/19 290 lb (131.5 kg)  04/06/19 296 lb 9 oz (134.5 kg)  03/02/19 285 lb (129.3 kg)     Other studies personally reviewed: Labs As above    Last device remote is reviewed from [facility] PDF dated 3/21 which reveals normal device function,   arrhythmias -   * His device has reached ERI 04/21/19   ASSESSMENT & PLAN:   Nonischemic cardiomyopathy  Congestive heart failure-acute chronic-systolic  Hypertension  Implantable defibrillator-CRT [employer]   Sleep disorder breathing  Atrial fibrillation  High Risk Medication Surveillance   Inappropriate ICD discharges   No intercurrent atrial fibrillation or flutter  Euvolemic  continue current meds  On Anticoagulation;  No bleeding issues   We have reviewed the benefits and risks of generator replacement.  These include but are not limited to lead fracture and infection.  The patient understands, agrees and is willing to proceed.      COVID 19 screen The patient denies symptoms of COVID 19 at this time.  The importance of social distancing was discussed today.  Follow-up:  Post change out Next remote: will schedule  Current medicines are reviewed at length with the patient today.   The patient does not have concerns regarding his medicines.  The following changes were made today:  none  Labs/ tests ordered today include: mg at time of change out  No orders of the defined types were placed in this encounter.   Future tests ( post COVID )    Patient Risk:  after full review of this patients clinical status, I feel that they are at moderate risk at this time.  Today, I have spent 9  minutes with the patient with telehealth technology discussing the above.  Signed, Virl Axe, MD  04/26/2019 3:47 PM     [facility] [address] [place] [address] [phone] (office) [phone] (fax)

## 2019-04-26 NOTE — Patient Instructions (Signed)
Medication Instructions:  Your physician recommends that you continue on your current medications as directed. Please refer to the Current Medication list given to you today.  Labwork: None ordered.  Testing/Procedures: None ordered.  Follow-Up: Dr Olin Pia scheduler will call you with your follow appointments  Remote monitoring is used to monitor your Pacemaker of ICD from home. This monitoring reduces the number of office visits required to check your device to one time per year. It allows [place] to keep an eye on the functioning of your device to ensure it is working properly.   Any Other Special Instructions Will Be Listed Below (If Applicable).  You will be scheduled for your Bi-V ICD generator change out.  If you need a refill on your cardiac medications before your next appointment, please call your pharmacy.

## 2019-05-01 ENCOUNTER — Telehealth: Payer: Self-pay

## 2019-05-01 DIAGNOSIS — Z79899 Other long term (current) drug therapy: Secondary | ICD-10-CM

## 2019-05-01 DIAGNOSIS — Z01812 Encounter for preprocedural laboratory examination: Secondary | ICD-10-CM

## 2019-05-01 NOTE — Telephone Encounter (Signed)
Spoke with pt and reviewed pt instruction letter for generator change out scheduled for 06/06/2019. ( see letter)  Pt advised copy of letter released to [place].  Pt advised a hard copy of letter will be placed downstairs for pick up along with surgical scrub and instructions.  Pt verbalized understanding and agrees with current plan.

## 2019-05-14 NOTE — Telephone Encounter (Signed)
Called patient and got him scheduled for cpe and labs. 

## 2019-05-15 ENCOUNTER — Ambulatory Visit (INDEPENDENT_AMBULATORY_CARE_PROVIDER_SITE_OTHER): Payer: PPO

## 2019-05-15 VITALS — Wt 290.0 lb

## 2019-05-15 DIAGNOSIS — Z0000 Encounter for general adult medical examination without abnormal findings: Secondary | ICD-10-CM | POA: Diagnosis not present

## 2019-05-15 NOTE — Progress Notes (Signed)
Subjective:   Aaron Thompson is a 59 y.o. male who presents for an Initial Medicare Annual Wellness Visit.  Review of Systems: [name]   This visit is being conducted through telemedicine via telephone at the nurse health advisor's home address due to the COVID-19 pandemic. This patient has given me verbal consent via doximity to conduct this visit, patient states they are participating from their home address. Patient and myself are on the telephone call. There is no referral for this visit. Some vital signs may be absent or patient reported.    Patient identification: identified by name, DOB, and current address   Cardiac Risk Factors include: advanced age (>36mn, >>51women);male gender;diabetes mellitus;hypertension;dyslipidemia    Objective:    Today's Vitals   05/15/19 1526  Weight: 290 lb (131.5 kg)   Body mass index is 35.3 kg/m.  Advanced Directives 05/15/2019 10/26/2016 10/21/2016 10/14/2015 08/13/2015 08/12/2015 07/29/2015  Does Patient Have a Medical Advance Directive? Yes Yes No Yes No No No  Type of AParamedicof ALymanLiving will Living will - Living will;Healthcare Power of Attorney - - -  Does patient want to make changes to medical advance directive? - - - No - Patient declined - - -  Copy of [place]? No - copy requested No - copy requested No - copy requested Yes - - -  Would patient like information on creating a medical advance directive? - - - - No - patient declined information - -  Pre-existing out of facility DNR order (yellow form or pink MOST form) - - - - - - -    Current Medications (verified) Outpatient Encounter Medications as of 05/15/2019  Medication Sig  . carvedilol (COREG) 25 MG tablet TAKE 2 TABLETS(50 MG) BY MOUTH TWICE DAILY WITH A MEAL  . cetirizine (ZYRTEC) 10 MG tablet Take 10 mg by mouth daily as needed for allergies.  .Marland Kitchendofetilide (TIKOSYN) 500 MCG capsule TAKE 1 CAPSULE(500 MCG) BY MOUTH  TWICE DAILY  . glipiZIDE (GLIPIZIDE XL) 10 MG 24 hr tablet Take 1 tablet (10 mg total) by mouth daily with breakfast.  . glucose blood (ONE TOUCH ULTRA TEST) test strip CHECK GLUCOSE TWICE DAILY AND AS NEEDED, DX. E11.65 (UNCONTROLLED DM)  . JARDIANCE 10 MG TABS tablet TAKE 1 TABLET BY MOUTH DAILY  . lovastatin (MEVACOR) 40 MG tablet Take 1 tablet (40 mg total) by mouth at bedtime.  . metFORMIN (GLUCOPHAGE-XR) 500 MG 24 hr tablet TAKE 3 TABLETS(1500 MG) BY MOUTH DAILY WITH BREAKFAST  . Multiple Vitamin (MULTIVITAMIN) tablet Take 1 tablet by mouth daily.    .Earney NavyBicarbonate (ZEGERID OTC) 20-1100 MG CAPS Take 1 capsule by mouth 2 (two) times daily.   . potassium chloride SA (K-DUR,KLOR-CON) 20 MEQ tablet Take 2 tablets (40 mEq total) by mouth 2 (two) times daily. (Patient taking differently: Take 40 mEq by mouth daily. )  . rivaroxaban (XARELTO) 20 MG TABS tablet TAKE 1 TABLET(20 MG) BY MOUTH DAILY WITH SUPPER  . sacubitril-valsartan (ENTRESTO) 97-103 MG Take 1 tablet by mouth 2 (two) times daily. Pt need to keep upcoming appt in June for further refills  . spironolactone (ALDACTONE) 25 MG tablet TAKE 1 TABLET(25 MG) BY MOUTH DAILY  . traMADol (ULTRAM) 50 MG tablet TAKE 1 TABLET(50 MG) BY MOUTH EVERY 12 HOURS AS NEEDED FOR PAIN   No facility-administered encounter medications on file as of 05/15/2019.    Allergies (verified) Atorvastatin   History: Past Medical  History:  Diagnosis Date  . AICD (automatic cardioverter/defibrillator) present   . Anxiety state, unspecified   . Calculus of kidney 1981   "passed it on my own"  . Chronic systolic CHF (congestive heart failure) ([place])   . Dermatophytosis of the body   . DJD (degenerative joint disease)   . Esophageal reflux   . Hx of colonic polyps   . Hypertension   . LBBB (left bundle branch block)    intermittent  . Non-ischemic cardiomyopathy ([place])    a. cath 2013: minor nonobstructive CAD.  [name] OSA (obstructive sleep  apnea)    he can't tolerate CPAP.   [name] Osteoarthrosis, unspecified whether generalized or localized, hand   . Other chronic nonalcoholic liver disease    fatty liver  . PAF (paroxysmal atrial fibrillation) ([place])    a. s/p DCCV 6/11; previously on Pradaxa;  b. Event Monitor 2012->No PAF;  c. 09/2011 s/p DCCV ->Xarelto started. d. 03/2014: inappropriate ICD shocks for AF-RVR, started on Tikosyn, Xarelto restarted.  . Type II diabetes mellitus ([place])   . Unspecified hearing loss    no hearing aid   Past Surgical History:  Procedure Laterality Date  . Abdominal US  01/2007   Fatty liver, no gallstones  . APPENDECTOMY    . ATRIAL TACH ABLATION  09/2015  . BI-VENTRICULAR IMPLANTABLE CARDIOVERTER DEFIBRILLATOR [name] 02/14/2013   STJ CRTD implanted by Dr Caryl Comes  . BILATERAL KNEE ARTHROSCOPY    . CARDIOVERSION  09/29/2011   Procedure: CARDIOVERSION;  Surgeon: Thayer Headings, MD;  Location: [facility];  Service: Cardiovascular;  Laterality: [name];  . COLONOSCOPY WITH PROPOFOL [name] 10/26/2016   Procedure: COLONOSCOPY WITH PROPOFOL;  Surgeon: Irene Shipper, MD;  Location: [facility];  Service: Endoscopy;  Laterality: [name];  . DOPPLER ECHOCARDIOGRAPHY  11/2009   Decreased EF of 35-40%  . ELECTROPHYSIOLOGIC STUDY [name] 10/14/2015   Procedure: Atrial Fibrillation Ablation;  Surgeon: [name], MD;  Location: [facility];  Service: Cardiovascular;  Laterality: [name];  . LEFT HEART CATHETERIZATION WITH CORONARY ANGIOGRAM [name] 08/18/2011   Procedure: LEFT HEART CATHETERIZATION WITH CORONARY ANGIOGRAM;  Surgeon: Peter M Martinique, MD;  Location: [facility] CATH LAB;  Service: Cardiovascular;  Laterality: [name];  . NECK SURGERY     plate/fusion  . Stress Cardiolite  08/2005   Normal, EF 42%  . UPPER GASTROINTESTINAL ENDOSCOPY  08/2006   GERD   Family History  Problem Relation Age of Onset  . Hypertension Mother   . Diabetes Mother   . Heart failure Mother        Died @ 45  . Hypertension Father   . Heart failure Father         Died @ 39  . Prostate cancer Father   . Diabetes Brother   . Kidney disease Brother   . Diabetes Brother   . Prostate cancer Brother   . Diabetes Brother   . Other Sister   . Heart attack Paternal Grandfather   . Stroke Paternal Grandmother   . Colon cancer Paternal Grandmother   . Multiple myeloma Sister   . Leukemia Sister   . Colon polyps Sister   . Cirrhosis Maternal Uncle        alcohol related  . Esophageal cancer Neg Hx   . Stomach cancer Neg Hx   . Pancreatic cancer Neg Hx    Social History   Socioeconomic History  . Marital status: Married    Spouse name: Not on file  . Number  of children: 2  . Years of education: Not on file  . Highest education level: Not on file  Occupational History  . Occupation: Banker: UNEMPLOYED  Tobacco Use  . Smoking status: Never Smoker  . Smokeless tobacco: Former Systems developer    Types: Snuff  . Tobacco comment: 02/14/2013 "quit snuff in 2006"  Substance and Sexual Activity  . Alcohol use: No    Alcohol/week: 0.0 standard drinks  . Drug use: No  . Sexual activity: Yes  Other Topics Concern  . Not on file  Social History Narrative   Lives in [place] with wife.  Works @ [employer] in [employer].   Married 1990   Social Determinants of Health   Financial Resource Strain: Low Risk   . Difficulty of Paying Living Expenses: Not hard at all  Food Insecurity: No Food Insecurity  . Worried About Charity fundraiser in the Last Year: Never true  . Ran Out of Food in the Last Year: Never true  Transportation Needs: No Transportation Needs  . Lack of Transportation (Medical): No  . Lack of Transportation (Non-Medical): No  Physical Activity: Inactive  . Days of Exercise per Week: 0 days  . Minutes of Exercise per Session: 0 min  Stress: No Stress Concern Present  . Feeling of Stress : Not at all  Social Connections:   . Frequency of Communication with Friends and Family:   . Frequency of Social Gatherings  with Friends and Family:   . Attends Religious Services:   . Active Member of Clubs or Organizations:   . Attends Archivist Meetings:   [name] Marital Status:    Tobacco Counseling Counseling given: Not Answered Comment: 02/14/2013 "quit snuff in 2006"   Clinical Intake:  Pre-visit preparation completed: Yes  Pain : No/denies pain     Nutritional Risks: None Diabetes: Yes CBG done?: No Did pt. bring in CBG monitor from home?: No  How often do you need to have someone help you when you read instructions, pamphlets, or other written materials from your doctor or pharmacy?: 1 - Never What is the last grade level you completed in school?: 12th  Interpreter Needed?: No  Information entered by :: CJohnson,LPN  Activities of Daily Living In your present state of health, do you have any difficulty performing the following activities: 05/15/2019  Hearing? Y  Comment some right ear hearing loss  Vision? N  Difficulty concentrating or making decisions? N  Walking or climbing stairs? N  Dressing or bathing? N  Doing errands, shopping? N  Preparing Food and eating ? N  Using the Toilet? N  In the past six months, have you accidently leaked urine? N  Do you have problems with loss of bowel control? N  Managing your Medications? N  Managing your Finances? N  Housekeeping or managing your Housekeeping? N  Some recent data might be hidden     Immunizations and Health Maintenance Immunization History  Administered Date(s) Administered  . Influenza, Seasonal, Injecte, Preservative Fre 12/03/2013  . Influenza,inj,Quad PF,6+ Mos 10/05/2016, 11/04/2017, 12/01/2018  . Influenza-Unspecified 12/09/2012, 11/24/2014  . Moderna SARS-COVID-2 Vaccination 05/13/2019  . Pneumococcal Polysaccharide-23 09/30/2011  . Td 07/23/2015   There are no preventive care reminders to display for this patient.  Patient Care Team: [name], MD as PCP - General (Family  Medicine)  Indicate any recent Medical Services you may have received from other than [facility] providers in the past year (date may be  approximate).    Assessment:   This is a routine wellness examination for Aaron Thompson.  Hearing/Vision screen  Hearing Screening   '125Hz'  '250Hz'  '500Hz'  '1000Hz'  '2000Hz'  '3000Hz'  '4000Hz'  '6000Hz'  '8000Hz'   Right ear:           Left ear:           Vision Screening Comments: Patient gets annual eye exams.  Dietary issues and exercise activities discussed: Current Exercise Habits: The patient does not participate in regular exercise at present, Exercise limited by: None identified  Goals    . Patient Stated     05/15/2019, I will maintain and continue medications as prescribed.      Depression Screen PHQ 2/9 Scores 05/15/2019 12/01/2018 07/05/2017  PHQ - 2 Score 0 0 0  PHQ- 9 Score 0 0 -    Fall Risk Fall Risk  05/15/2019  Falls in the past year? 1  Comment tripped on step  Number falls in past yr: 0  Injury with Fall? 0  Risk for fall due to : Medication side effect  Follow up Falls evaluation completed;Falls prevention discussed    Is the patient's home free of loose throw rugs in walkways, pet beds, electrical cords, etc?   yes      Grab bars in the bathroom? no      Handrails on the stairs?   yes      Adequate lighting?   yes  Timed Get Up and Go performed: [name]  Cognitive Function: MMSE - Mini Mental State Exam 05/15/2019  Orientation to time 5  Orientation to Place 5  Registration 3  Attention/ Calculation 5  Recall 3  Language- repeat 1       Mini Cog  Mini-Cog screen was completed. Maximum score is 22. A value of 0 denotes this part of the MMSE was not completed or the patient failed this part of the Mini-Cog screening.  Screening Tests Health Maintenance  Topic Date Due  . COVID-19 Vaccine (2 - Moderna 2-dose series) 06/10/2019  . INFLUENZA VACCINE  08/26/2019  . HEMOGLOBIN A1C  10/07/2019  . FOOT EXAM  04/05/2020  . OPHTHALMOLOGY EXAM   04/30/2020  . TETANUS/TDAP  07/22/2025  . COLONOSCOPY  10/27/2026  . PNEUMOCOCCAL POLYSACCHARIDE VACCINE AGE 52-64 HIGH RISK  Completed  . Hepatitis C Screening  Completed  . HIV Screening  Completed    Qualifies for Shingles Vaccine: Yes  Cancer Screenings: Lung: Low Dose CT Chest recommended if Age 52-80 years, 30 pack-year currently smoking OR have quit w/in 15 years. Patient does not qualify. Colorectal: completed 10/26/2016  Additional Screenings:  Hepatitis C Screening: 06/23/2016      Plan:   Patient will maintain and continue medications as prescribed.   I have personally reviewed and noted the following in the patient's chart:   . Medical and social history . Use of alcohol, tobacco or illicit drugs  . Current medications and supplements . Functional ability and status . Nutritional status . Physical activity . Advanced directives . List of other physicians . Hospitalizations, surgeries, and ER visits in previous 12 months . Vitals . Screenings to include cognitive, depression, and falls . Referrals and appointments  In addition, I have reviewed and discussed with patient certain preventive protocols, quality metrics, and best practice recommendations. A written personalized care plan for preventive services as well as general preventive health recommendations were provided to patient.     Andrez Grime, LPN   [id number]

## 2019-05-15 NOTE — Progress Notes (Signed)
PCP notes:  Health Maintenance: No gaps noted    Abnormal Screenings: none   Patient concerns: none   Nurse concerns: none   Next PCP appt: none 

## 2019-05-15 NOTE — Patient Instructions (Addendum)
Mr. Aaron Thompson , Thank you for taking time to come for your Medicare Wellness Visit. I appreciate your ongoing commitment to your health goals. Please review the following plan we discussed and let me know if I can assist you in the future.   Screening recommendations/referrals: Colonoscopy: Up to date, completed 10/26/2016 Recommended yearly ophthalmology/optometry visit for glaucoma screening and checkup Recommended yearly dental visit for hygiene and checkup  Vaccinations: Influenza vaccine: Up to date, completed 12/01/2018 Pneumococcal vaccine: age 44 Tdap vaccine: Up to date, completed 07/23/2015 Shingles vaccine: discussed    Advanced directives: Please bring a copy of your POA (Power of [place]) and/or Living Will to your next appointment.   Conditions/risks identified: diabetes, hypertension, hyperlipidemia  Next appointment: none  Preventive Care 40-64 Years, Male Preventive care refers to lifestyle choices and visits with your health care provider that can promote health and wellness. What does preventive care include?  A yearly physical exam. This is also called an annual well check.  Dental exams once or twice a year.  Routine eye exams. Ask your health care provider how often you should have your eyes checked.  Personal lifestyle choices, including:  Daily care of your teeth and gums.  Regular physical activity.  Eating a healthy diet.  Avoiding tobacco and drug use.  Limiting alcohol use.  Practicing safe sex.  Taking low-dose aspirin every day starting at age 68. What happens during an annual well check? The services and screenings done by your health care provider during your annual well check will depend on your age, overall health, lifestyle risk factors, and family history of disease. Counseling  Your health care provider may ask you questions about your:  Alcohol use.  Tobacco use.  Drug use.  Emotional well-being.  Home and relationship  well-being.  Sexual activity.  Eating habits.  Work and work Statistician. Screening  You may have the following tests or measurements:  Height, weight, and BMI.  Blood pressure.  Lipid and cholesterol levels. These may be checked every 5 years, or more frequently if you are over 77 years old.  Skin check.  Lung cancer screening. You may have this screening every year starting at age 70 if you have a 30-pack-year history of smoking and currently smoke or have quit within the past 15 years.  Fecal occult blood test (FOBT) of the stool. You may have this test every year starting at age 39.  Flexible sigmoidoscopy or colonoscopy. You may have a sigmoidoscopy every 5 years or a colonoscopy every 10 years starting at age 37.  Prostate cancer screening. Recommendations will vary depending on your family history and other risks.  Hepatitis C blood test.  Hepatitis B blood test.  Sexually transmitted disease (STD) testing.  Diabetes screening. This is done by checking your blood sugar (glucose) after you have not eaten for a while (fasting). You may have this done every 1-3 years. Discuss your test results, treatment options, and if necessary, the need for more tests with your health care provider. Vaccines  Your health care provider may recommend certain vaccines, such as:  Influenza vaccine. This is recommended every year.  Tetanus, diphtheria, and acellular pertussis (Tdap, Td) vaccine. You may need a Td booster every 10 years.  Zoster vaccine. You may need this after age 60.  Pneumococcal 13-valent conjugate (PCV13) vaccine. You may need this if you have certain conditions and have not been vaccinated.  Pneumococcal polysaccharide (PPSV23) vaccine. You may need one or two doses if you smoke  cigarettes or if you have certain conditions. Talk to your health care provider about which screenings and vaccines you need and how often you need them. This information is not intended  to replace advice given to you by your health care provider. Make sure you discuss any questions you have with your health care provider. Document Released: 02/07/2015 Document Revised: 10/01/2015 Document Reviewed: 11/12/2014 Elsevier Interactive Patient Education  2017 [place] Prevention in the Home Falls can cause injuries. They can happen to people of all ages. There are many things you can do to make your home safe and to help prevent falls. What can I do on the outside of my home?  Regularly fix the edges of walkways and driveways and fix any cracks.  Remove anything that might make you trip as you walk through a door, such as a raised step or threshold.  Trim any bushes or trees on the path to your home.  Use bright outdoor lighting.  Clear any walking paths of anything that might make someone trip, such as rocks or tools.  Regularly check to see if handrails are loose or broken. Make sure that both sides of any steps have handrails.  Any raised decks and porches should have guardrails on the edges.  Have any leaves, snow, or ice cleared regularly.  Use sand or salt on walking paths during winter.  Clean up any spills in your garage right away. This includes oil or grease spills. What can I do in the bathroom?  Use night lights.  Install grab bars by the toilet and in the tub and shower. Do not use towel bars as grab bars.  Use non-skid mats or decals in the tub or shower.  If you need to sit down in the shower, use a plastic, non-slip stool.  Keep the floor dry. Clean up any water that spills on the floor as soon as it happens.  Remove soap buildup in the tub or shower regularly.  Attach bath mats securely with double-sided non-slip rug tape.  Do not have throw rugs and other things on the floor that can make you trip. What can I do in the bedroom?  Use night lights.  Make sure that you have a light by your bed that is easy to reach.  Do not use  any sheets or blankets that are too big for your bed. They should not hang down onto the floor.  Have a firm chair that has side arms. You can use this for support while you get dressed.  Do not have throw rugs and other things on the floor that can make you trip. What can I do in the kitchen?  Clean up any spills right away.  Avoid walking on wet floors.  Keep items that you use a lot in easy-to-reach places.  If you need to reach something above you, use a strong step stool that has a grab bar.  Keep electrical cords out of the way.  Do not use floor polish or wax that makes floors slippery. If you must use wax, use non-skid floor wax.  Do not have throw rugs and other things on the floor that can make you trip. What can I do with my stairs?  Do not leave any items on the stairs.  Make sure that there are handrails on both sides of the stairs and use them. Fix handrails that are broken or loose. Make sure that handrails are as long as the stairways.  Check any carpeting to make sure that it is firmly attached to the stairs. Fix any carpet that is loose or worn.  Avoid having throw rugs at the top or bottom of the stairs. If you do have throw rugs, attach them to the floor with carpet tape.  Make sure that you have a light switch at the top of the stairs and the bottom of the stairs. If you do not have them, ask someone to add them for you. What else can I do to help prevent falls?  Wear shoes that:  Do not have high heels.  Have rubber bottoms.  Are comfortable and fit you well.  Are closed at the toe. Do not wear sandals.  If you use a stepladder:  Make sure that it is fully opened. Do not climb a closed stepladder.  Make sure that both sides of the stepladder are locked into place.  Ask someone to hold it for you, if possible.  Clearly [place] and make sure that you can see:  Any grab bars or handrails.  First and last steps.  Where the edge of each step  is.  Use tools that help you move around (mobility aids) if they are needed. These include:  Canes.  Walkers.  Scooters.  Crutches.  Turn on the lights when you go into a dark area. Replace any light bulbs as soon as they burn out.  Set up your furniture so you have a clear path. Avoid moving your furniture around.  If any of your floors are uneven, fix them.  If there are any pets around you, be aware of where they are.  Review your medicines with your doctor. Some medicines can make you feel dizzy. This can increase your chance of falling. Ask your doctor what other things that you can do to help prevent falls. This information is not intended to replace advice given to you by your health care provider. Make sure you discuss any questions you have with your health care provider. Document Released: 11/07/2008 Document Revised: 06/19/2015 Document Reviewed: 02/15/2014 Elsevier Interactive Patient Education  2017 [employer].

## 2019-05-24 ENCOUNTER — Ambulatory Visit (INDEPENDENT_AMBULATORY_CARE_PROVIDER_SITE_OTHER): Payer: PPO | Admitting: *Deleted

## 2019-05-24 DIAGNOSIS — I428 Other cardiomyopathies: Secondary | ICD-10-CM

## 2019-05-25 ENCOUNTER — Telehealth: Payer: Self-pay

## 2019-05-25 LAB — CUP PACEART REMOTE DEVICE CHECK
Battery Remaining Longevity: 0 mo
Battery Voltage: 2.59 V
Brady Statistic AP VP Percent: 1 %
Brady Statistic AP VS Percent: 1 %
Brady Statistic AS VP Percent: 98 %
Brady Statistic AS VS Percent: 1 %
Brady Statistic RA Percent Paced: 1 %
Date Time Interrogation Session: 20210430095523
HighPow Impedance: 91 Ohm
HighPow Impedance: 91 Ohm
Implantable Lead Implant Date: 20150121
Implantable Lead Implant Date: 20150121
Implantable Lead Implant Date: 20150121
Implantable Lead Location: 753858
Implantable Lead Location: 753859
Implantable Lead Location: 753860
Implantable Lead Model: 7122
Implantable Pulse Generator Implant Date: 20150121
Lead Channel Impedance Value: 430 Ohm
Lead Channel Impedance Value: 640 Ohm
Lead Channel Impedance Value: 850 Ohm
Lead Channel Pacing Threshold Amplitude: 0.75 V
Lead Channel Pacing Threshold Amplitude: 1.125 V
Lead Channel Pacing Threshold Amplitude: 2 V
Lead Channel Pacing Threshold Pulse Width: 0.5 ms
Lead Channel Pacing Threshold Pulse Width: 0.5 ms
Lead Channel Pacing Threshold Pulse Width: 0.8 ms
Lead Channel Sensing Intrinsic Amplitude: 12 mV
Lead Channel Sensing Intrinsic Amplitude: 5 mV
Lead Channel Setting Pacing Amplitude: 1.75 V
Lead Channel Setting Pacing Amplitude: 2.125
Lead Channel Setting Pacing Amplitude: 3.5 V
Lead Channel Setting Pacing Pulse Width: 0.5 ms
Lead Channel Setting Pacing Pulse Width: 0.8 ms
Lead Channel Setting Sensing Sensitivity: 0.5 mV
Pulse Gen Serial Number: 7133975

## 2019-05-25 NOTE — Telephone Encounter (Signed)
Spoke with patient to remind of missed remote transmission 

## 2019-05-25 NOTE — Addendum Note (Signed)
Addended by: Jennette Banker on: 05/25/2019 02:27 PM   Modules accepted: Level of Service

## 2019-05-31 ENCOUNTER — Encounter: Payer: PPO | Admitting: Family Medicine

## 2019-06-01 ENCOUNTER — Other Ambulatory Visit: Payer: Self-pay

## 2019-06-01 ENCOUNTER — Other Ambulatory Visit: Payer: PPO | Admitting: *Deleted

## 2019-06-01 DIAGNOSIS — Z01812 Encounter for preprocedural laboratory examination: Secondary | ICD-10-CM | POA: Diagnosis not present

## 2019-06-01 DIAGNOSIS — Z79899 Other long term (current) drug therapy: Secondary | ICD-10-CM

## 2019-06-01 LAB — BASIC METABOLIC PANEL
BUN/Creatinine Ratio: 25 — ABNORMAL HIGH (ref 9–20)
BUN: 19 mg/dL (ref 6–24)
CO2: 22 mmol/L (ref 20–29)
Calcium: 8.8 mg/dL (ref 8.7–10.2)
Chloride: 101 mmol/L (ref 96–106)
Creatinine, Ser: 0.75 mg/dL — ABNORMAL LOW (ref 0.76–1.27)
GFR calc Af Amer: 118 mL/min/{1.73_m2} (ref >59)
GFR calc non Af Amer: 102 mL/min/{1.73_m2} (ref >59)
Glucose: 178 mg/dL — ABNORMAL HIGH (ref 65–99)
Potassium: 4.7 mmol/L (ref 3.5–5.2)
Sodium: 138 mmol/L (ref 134–144)

## 2019-06-01 LAB — MAGNESIUM: Magnesium: 2.2 mg/dL (ref 1.6–2.3)

## 2019-06-01 LAB — CBC
Hematocrit: 47 % (ref 37.5–51.0)
Hemoglobin: 15.9 g/dL (ref 13.0–17.7)
MCH: 29 pg (ref 26.6–33.0)
MCHC: 33.8 g/dL (ref 31.5–35.7)
MCV: 86 fL (ref 79–97)
Platelets: 178 10*3/uL (ref 150–450)
RBC: 5.48 x10E6/uL (ref 4.14–5.80)
RDW: 13.3 % (ref 11.6–15.4)
WBC: 6.1 10*3/uL (ref 3.4–10.8)

## 2019-06-02 ENCOUNTER — Other Ambulatory Visit (HOSPITAL_COMMUNITY)
Admission: RE | Admit: 2019-06-02 | Discharge: 2019-06-02 | Disposition: A | Discharge status: Home / Self Care | Payer: PPO | Source: Ambulatory Visit | Attending: Internal Medicine | Admitting: Internal Medicine

## 2019-06-02 DIAGNOSIS — Z01812 Encounter for preprocedural laboratory examination: Secondary | ICD-10-CM | POA: Insufficient documentation

## 2019-06-02 DIAGNOSIS — Z20822 Contact with and (suspected) exposure to covid-19: Secondary | ICD-10-CM | POA: Insufficient documentation

## 2019-06-02 LAB — SARS CORONAVIRUS 2 (TAT 6-24 HRS): SARS Coronavirus 2: NEGATIVE

## 2019-06-06 ENCOUNTER — Ambulatory Visit (HOSPITAL_COMMUNITY)
Admission: RE | Admit: 2019-06-06 | Discharge: 2019-06-06 | Disposition: A | Discharge status: Home / Self Care | Payer: PPO | Attending: Internal Medicine | Admitting: Internal Medicine

## 2019-06-06 ENCOUNTER — Other Ambulatory Visit: Payer: Self-pay

## 2019-06-06 ENCOUNTER — Encounter (HOSPITAL_COMMUNITY)
Admission: RE | Disposition: A | Discharge status: Home / Self Care | Payer: Self-pay | Source: Home / Self Care | Attending: Internal Medicine

## 2019-06-06 DIAGNOSIS — I480 Paroxysmal atrial fibrillation: Secondary | ICD-10-CM | POA: Diagnosis not present

## 2019-06-06 DIAGNOSIS — I447 Left bundle-branch block, unspecified: Secondary | ICD-10-CM | POA: Diagnosis not present

## 2019-06-06 DIAGNOSIS — Z7984 Long term (current) use of oral hypoglycemic drugs: Secondary | ICD-10-CM | POA: Diagnosis not present

## 2019-06-06 DIAGNOSIS — Z7901 Long term (current) use of anticoagulants: Secondary | ICD-10-CM | POA: Diagnosis not present

## 2019-06-06 DIAGNOSIS — I110 Hypertensive heart disease with heart failure: Secondary | ICD-10-CM | POA: Diagnosis not present

## 2019-06-06 DIAGNOSIS — K760 Fatty (change of) liver, not elsewhere classified: Secondary | ICD-10-CM | POA: Diagnosis not present

## 2019-06-06 DIAGNOSIS — Z8249 Family history of ischemic heart disease and other diseases of the circulatory system: Secondary | ICD-10-CM | POA: Insufficient documentation

## 2019-06-06 DIAGNOSIS — I428 Other cardiomyopathies: Secondary | ICD-10-CM | POA: Diagnosis not present

## 2019-06-06 DIAGNOSIS — K219 Gastro-esophageal reflux disease without esophagitis: Secondary | ICD-10-CM | POA: Diagnosis not present

## 2019-06-06 DIAGNOSIS — Z006 Encounter for examination for normal comparison and control in clinical research program: Secondary | ICD-10-CM | POA: Diagnosis not present

## 2019-06-06 DIAGNOSIS — E119 Type 2 diabetes mellitus without complications: Secondary | ICD-10-CM | POA: Insufficient documentation

## 2019-06-06 DIAGNOSIS — M19049 Primary osteoarthritis, unspecified hand: Secondary | ICD-10-CM | POA: Diagnosis not present

## 2019-06-06 DIAGNOSIS — Z888 Allergy status to other drugs, medicaments and biological substances status: Secondary | ICD-10-CM | POA: Insufficient documentation

## 2019-06-06 DIAGNOSIS — Z79899 Other long term (current) drug therapy: Secondary | ICD-10-CM | POA: Diagnosis not present

## 2019-06-06 DIAGNOSIS — Z833 Family history of diabetes mellitus: Secondary | ICD-10-CM | POA: Diagnosis not present

## 2019-06-06 DIAGNOSIS — G4733 Obstructive sleep apnea (adult) (pediatric): Secondary | ICD-10-CM | POA: Diagnosis not present

## 2019-06-06 DIAGNOSIS — H9190 Unspecified hearing loss, unspecified ear: Secondary | ICD-10-CM | POA: Diagnosis not present

## 2019-06-06 DIAGNOSIS — Z4502 Encounter for adjustment and management of automatic implantable cardiac defibrillator: Secondary | ICD-10-CM | POA: Insufficient documentation

## 2019-06-06 DIAGNOSIS — Z87891 Personal history of nicotine dependence: Secondary | ICD-10-CM | POA: Diagnosis not present

## 2019-06-06 DIAGNOSIS — I5021 Acute systolic (congestive) heart failure: Secondary | ICD-10-CM | POA: Diagnosis not present

## 2019-06-06 HISTORY — PX: BIV ICD GENERATOR CHANGEOUT: EP1194

## 2019-06-06 LAB — GLUCOSE, CAPILLARY: Glucose-Capillary: 188 mg/dL — ABNORMAL HIGH (ref 70–99)

## 2019-06-06 SURGERY — BIV ICD GENERATOR CHANGEOUT

## 2019-06-06 MED ORDER — FENTANYL CITRATE (PF) 100 MCG/2ML IJ SOLN
INTRAMUSCULAR | Status: DC | PRN
  Administered 2019-06-06: 50 ug via INTRAVENOUS
  Administered 2019-06-06: 25 ug via INTRAVENOUS

## 2019-06-06 MED ORDER — CHLORHEXIDINE GLUCONATE 4 % EX LIQD
4.0000 "application " | Freq: Once | CUTANEOUS | Status: DC
  Filled 2019-06-06: qty 60

## 2019-06-06 MED ORDER — LIDOCAINE HCL (PF) 1 % IJ SOLN
INTRAMUSCULAR | Status: DC | PRN
  Administered 2019-06-06: 60 mL

## 2019-06-06 MED ORDER — ACETAMINOPHEN 325 MG PO TABS: 325.0000 mg | ORAL_TABLET | ORAL | Status: DC | PRN

## 2019-06-06 MED ORDER — MIDAZOLAM HCL 5 MG/5ML IJ SOLN
INTRAMUSCULAR | Status: DC | PRN
  Administered 2019-06-06: 3 mg via INTRAVENOUS
  Administered 2019-06-06: 1 mg via INTRAVENOUS

## 2019-06-06 MED ORDER — DEXTROSE 5 % IV SOLN
3.0000 g | INTRAVENOUS | Status: AC
  Administered 2019-06-06: 3 g via INTRAVENOUS
  Filled 2019-06-06: qty 3

## 2019-06-06 MED ORDER — ONDANSETRON HCL 4 MG/2ML IJ SOLN: 4.0000 mg | Freq: Four times a day (QID) | INTRAMUSCULAR | Status: DC | PRN

## 2019-06-06 MED ORDER — SODIUM CHLORIDE 0.9 % IV SOLN: INTRAVENOUS | Status: DC

## 2019-06-06 MED ORDER — MIDAZOLAM HCL 5 MG/5ML IJ SOLN
INTRAMUSCULAR | Status: AC
  Filled 2019-06-06: qty 5

## 2019-06-06 MED ORDER — FENTANYL CITRATE (PF) 100 MCG/2ML IJ SOLN
INTRAMUSCULAR | Status: AC
  Filled 2019-06-06: qty 2

## 2019-06-06 MED ORDER — SODIUM CHLORIDE 0.9 % IV SOLN
INTRAVENOUS | Status: AC
  Filled 2019-06-06: qty 2

## 2019-06-06 MED ORDER — SODIUM CHLORIDE 0.9 % IV SOLN
80.0000 mg | INTRAVENOUS | Status: AC
  Administered 2019-06-06: 14:00:00 80 mg

## 2019-06-06 SURGICAL SUPPLY — 5 items
ASSURA CRTD CD3369-40C (ICD Generator) ×2 IMPLANT
CABLE SURGICAL S-101-97-12 (CABLE) ×2 IMPLANT
DEFIB ASSURA CRT-D (ICD Generator) ×1 IMPLANT
PAD DEFIB LIFELINK (PAD) ×2 IMPLANT
TRAY PACEMAKER INSERTION (PACKS) ×2 IMPLANT

## 2019-06-06 NOTE — Discharge Instructions (Signed)

## 2019-06-06 NOTE — H&P (Signed)
Patient Care Team: [name], MD as PCP - General (Family Medicine)   HPI  Aaron Thompson is a 59 y.o. male admitted for ICD-CRT change out   Underwent initial implant for NICM and LBBB; afib Rx with RFCA and  underwent catheter ablation of his atrial fibrillation (JA) 9/17  He had ICD shocks associated with very rapid atrial fibrillation.  He is now on dofetilide and Rivaroxaban last dose yday  Denies SOB chest pain or edema     DATE TEST EF   9/17 Echo  40 %   11/18 Echo  20-25%   1/20  Echo  25-30%        Date Cr K Hgb  3/21 0.69 4.7 15.7   5/21 0.75 4.4 96.0   Thromboembolic risk factors (, HTN-1, CHF-1) for a CHADSVASc Score of 2      Past Medical History:  Diagnosis Date  . AICD (automatic cardioverter/defibrillator) present   . Anxiety state, unspecified   . Calculus of kidney 1981   "passed it on my own"  . Chronic systolic CHF (congestive heart failure) ([place])   . Dermatophytosis of the body   . DJD (degenerative joint disease)   . Esophageal reflux   . Hx of colonic polyps   . Hypertension   . LBBB (left bundle branch block)    intermittent  . Non-ischemic cardiomyopathy ([employer])    a. cath 2013: minor nonobstructive CAD.  [name] OSA (obstructive sleep apnea)    he can't tolerate CPAP.   [name] Osteoarthrosis, unspecified whether generalized or localized, hand   . Other chronic nonalcoholic liver disease    fatty liver  . PAF (paroxysmal atrial fibrillation) ([place])    a. s/p DCCV 6/11; previously on Pradaxa;  b. Event Monitor 2012->No PAF;  c. 09/2011 s/p DCCV ->Xarelto started. d. 03/2014: inappropriate ICD shocks for AF-RVR, started on Tikosyn, Xarelto restarted.  . Type II diabetes mellitus ([place])   . Unspecified hearing loss    no hearing aid    Past Surgical History:  Procedure Laterality Date  . Abdominal US  01/2007   Fatty liver, no gallstones  . APPENDECTOMY    . ATRIAL TACH ABLATION  09/2015  . BI-VENTRICULAR  IMPLANTABLE CARDIOVERTER DEFIBRILLATOR [name] 02/14/2013   STJ CRTD implanted by Dr Caryl Comes  . BILATERAL KNEE ARTHROSCOPY    . CARDIOVERSION  09/29/2011   Procedure: CARDIOVERSION;  Surgeon: Thayer Headings, MD;  Location: [facility];  Service: Cardiovascular;  Laterality: [name];  . COLONOSCOPY WITH PROPOFOL [name] 10/26/2016   Procedure: COLONOSCOPY WITH PROPOFOL;  Surgeon: Irene Shipper, MD;  Location: [facility];  Service: Endoscopy;  Laterality: [name];  . DOPPLER ECHOCARDIOGRAPHY  11/2009   Decreased EF of 35-40%  . ELECTROPHYSIOLOGIC STUDY [name] 10/14/2015   Procedure: Atrial Fibrillation Ablation;  Surgeon: [name], MD;  Location: [facility];  Service: Cardiovascular;  Laterality: [name];  . LEFT HEART CATHETERIZATION WITH CORONARY ANGIOGRAM [name] 08/18/2011   Procedure: LEFT HEART CATHETERIZATION WITH CORONARY ANGIOGRAM;  Surgeon: Peter M Martinique, MD;  Location: [facility] CATH LAB;  Service: Cardiovascular;  Laterality: [name];  . NECK SURGERY     plate/fusion  . Stress Cardiolite  08/2005   Normal, EF 42%  . UPPER GASTROINTESTINAL ENDOSCOPY  08/2006   GERD    Current Facility-Administered Medications  Medication Dose Route Frequency Provider Last Rate Last Admin  . 0.9 %  sodium chloride infusion   Intravenous Continuous Deboraha Sprang, MD      .  ceFAZolin (ANCEF) 3 g in dextrose 5 % 50 mL IVPB  3 g Intravenous On Call Deboraha Sprang, MD      . chlorhexidine (HIBICLENS) 4 % liquid 4 application  4 application Topical Once Deboraha Sprang, MD      . gentamicin (GARAMYCIN) 80 mg in sodium chloride 0.9 % 500 mL irrigation  80 mg Irrigation On Call Deboraha Sprang, MD        Allergies  Allergen Reactions  . Atorvastatin     Muscle pain       Social History   Tobacco Use  . Smoking status: Never Smoker  . Smokeless tobacco: Former Systems developer    Types: Snuff  . Tobacco comment: 02/14/2013 "quit snuff in 2006"  Substance Use Topics  . Alcohol use: No    Alcohol/week: 0.0 standard drinks  . Drug use:  No     Family History  Problem Relation Age of Onset  . Hypertension Mother   . Diabetes Mother   . Heart failure Mother        Died @ 39  . Hypertension Father   . Heart failure Father        Died @ 46  . Prostate cancer Father   . Diabetes Brother   . Kidney disease Brother   . Diabetes Brother   . Prostate cancer Brother   . Diabetes Brother   . Other Sister   . Heart attack Paternal Grandfather   . Stroke Paternal Grandmother   . Colon cancer Paternal Grandmother   . Multiple myeloma Sister   . Leukemia Sister   . Colon polyps Sister   . Cirrhosis Maternal Uncle        alcohol related  . Esophageal cancer Neg Hx   . Stomach cancer Neg Hx   . Pancreatic cancer Neg Hx      Current Meds  Medication Sig  . acetaminophen (TYLENOL) 325 MG tablet Take 650 mg by mouth every 6 (six) hours as needed for mild pain or moderate pain.  . carvedilol (COREG) 25 MG tablet TAKE 2 TABLETS(50 MG) BY MOUTH TWICE DAILY WITH A MEAL (Patient taking differently: Take 50 mg by mouth 2 (two) times daily with a meal. )  . cetirizine (ZYRTEC) 10 MG tablet Take 10 mg by mouth daily.   [name] dofetilide (TIKOSYN) 500 MCG capsule TAKE 1 CAPSULE(500 MCG) BY MOUTH TWICE DAILY (Patient taking differently: Take 500 mcg by mouth 2 (two) times daily. )  . glipiZIDE (GLIPIZIDE XL) 10 MG 24 hr tablet Take 1 tablet (10 mg total) by mouth daily with breakfast.  . JARDIANCE 10 MG TABS tablet TAKE 1 TABLET BY MOUTH DAILY (Patient taking differently: Take 10 mg by mouth daily. )  . metFORMIN (GLUCOPHAGE-XR) 500 MG 24 hr tablet TAKE 3 TABLETS(1500 MG) BY MOUTH DAILY WITH BREAKFAST (Patient taking differently: Take 1,500 mg by mouth daily with breakfast. )  . Multiple Vitamin (MULTIVITAMIN) tablet Take 1 tablet by mouth daily.    [name] Bicarbonate (ZEGERID OTC) 20-1100 MG CAPS Take 1 capsule by mouth 2 (two) times daily.   . Pitavastatin Calcium (LIVALO) 1 MG TABS Take 1 mg by mouth every other day. Livalo    . potassium chloride SA (K-DUR,KLOR-CON) 20 MEQ tablet Take 2 tablets (40 mEq total) by mouth 2 (two) times daily. (Patient taking differently: Take 40 mEq by mouth daily. )  . rivaroxaban (XARELTO) 20 MG TABS tablet TAKE 1 TABLET(20 MG) BY MOUTH DAILY WITH  SUPPER (Patient taking differently: Take 20 mg by mouth daily with supper. )  . sacubitril-valsartan (ENTRESTO) 97-103 MG Take 1 tablet by mouth 2 (two) times daily. Pt need to keep upcoming appt in June for further refills  . spironolactone (ALDACTONE) 25 MG tablet TAKE 1 TABLET(25 MG) BY MOUTH DAILY (Patient taking differently: Take 25 mg by mouth daily. )  . traMADol (ULTRAM) 50 MG tablet TAKE 1 TABLET(50 MG) BY MOUTH EVERY 12 HOURS AS NEEDED FOR PAIN (Patient taking differently: Take 50 mg by mouth 2 (two) times daily. )     Review of Systems negative except from HPI and PMH  Physical Exam BP 129/83   Pulse 84   Temp (!) 97.5 F (36.4 C) (Oral)   Resp 18   Ht '6\' 4"'  (1.93 m)   Wt 130.6 kg   SpO2 100%   BMI 35.06 kg/m  Well developed and well nourished in no acute distress HENT normal E scleral and icterus clear Neck Supple Clear to ausculation Device pocket well healed; without hematoma or erythema.  There is no tethering Regular rate and rhythm, no murmurs gallops or rub Soft   No clubbing cyanosis  Edema Alert and oriented, grossly normal motor and sensory function Skin Warm and Dry    Assessment and  Plan Nonischemic cardiomyopathy  Congestive heart failure-acute chronic-systolic  Hypertension  Implantable defibrillator-CRT [employer]   Sleep disorder breathing  Atrial fibrillation  High Risk Medication Surveillance   Inappropriate ICD discharges   Device at ERI  We have reviewed the benefits and risks of generator replacement.  These include but are not limited to lead fracture and infection.  The patient understands, agrees and is willing to proceed.     Will hold Rivaroxaban tonight

## 2019-06-07 ENCOUNTER — Telehealth: Payer: Self-pay

## 2019-06-07 NOTE — Telephone Encounter (Signed)
Manual transmission received and reviewed,  No concerns.  Spoke with pt, reveiwed discharge instructions for activity limits.  Pt indicates he was told to keep outer dressing on for 48 hours.  Advised of s/s of infection to monitor for and report.  Overall patient states he feels well.

## 2019-06-07 NOTE — Telephone Encounter (Signed)
I called the pt to get him to send a manual transmission with his home remote monitor. I called [employer] tech support to get additional help to pair the pt device. Transmission sent. I told the pt that the nurse will review it and give him a call back.

## 2019-06-07 NOTE — Telephone Encounter (Signed)
-----   Message from Shirley Friar, PA-C sent at 06/06/2019  3:22 PM EDT ----- Same day discharge SK 06/06/2019 gen change

## 2019-06-08 ENCOUNTER — Telehealth: Payer: Self-pay | Admitting: Internal Medicine

## 2019-06-08 NOTE — Telephone Encounter (Signed)
Patient was scheduled to take off his bandage today. He is worried that the glue from the bandage will pull out his stitches.

## 2019-06-08 NOTE — Telephone Encounter (Signed)
Returned call to patient. Pt reports he was able to remove the outer occlusive dressing. Pt reports the steri-strips remain in place. He denies any wound concerns at this time. Reviewed wound care instructions. Pt denies additional questions or concerns at this time.

## 2019-06-17 ENCOUNTER — Other Ambulatory Visit: Payer: Self-pay | Admitting: Cardiovascular Disease

## 2019-06-19 ENCOUNTER — Encounter: Payer: Self-pay | Admitting: Internal Medicine

## 2019-06-19 ENCOUNTER — Other Ambulatory Visit: Payer: Self-pay

## 2019-06-19 ENCOUNTER — Ambulatory Visit (INDEPENDENT_AMBULATORY_CARE_PROVIDER_SITE_OTHER): Payer: PPO | Admitting: Emergency Medicine

## 2019-06-19 DIAGNOSIS — I425 Other restrictive cardiomyopathy: Secondary | ICD-10-CM

## 2019-06-19 DIAGNOSIS — I428 Other cardiomyopathies: Secondary | ICD-10-CM | POA: Diagnosis not present

## 2019-06-19 LAB — CUP PACEART INCLINIC DEVICE CHECK
Battery Remaining Longevity: 79 mo
Brady Statistic RA Percent Paced: 0.06 %
Brady Statistic RV Percent Paced: 98 %
Date Time Interrogation Session: 20210525102504
HighPow Impedance: 74.25 Ohm
Implantable Lead Implant Date: 20150121
Implantable Lead Implant Date: 20150121
Implantable Lead Implant Date: 20150121
Implantable Lead Location: 753858
Implantable Lead Location: 753859
Implantable Lead Location: 753860
Implantable Lead Model: 7122
Implantable Pulse Generator Implant Date: 20210512
Lead Channel Impedance Value: 475 Ohm
Lead Channel Impedance Value: 512.5 Ohm
Lead Channel Impedance Value: 950 Ohm
Lead Channel Pacing Threshold Amplitude: 0.75 V
Lead Channel Pacing Threshold Amplitude: 1.125 V
Lead Channel Pacing Threshold Amplitude: 1.75 V
Lead Channel Pacing Threshold Pulse Width: 0.4 ms
Lead Channel Pacing Threshold Pulse Width: 0.4 ms
Lead Channel Pacing Threshold Pulse Width: 0.5 ms
Lead Channel Sensing Intrinsic Amplitude: 11.7 mV
Lead Channel Sensing Intrinsic Amplitude: 5 mV
Lead Channel Setting Pacing Amplitude: 1.75 V
Lead Channel Setting Pacing Amplitude: 2.125
Lead Channel Setting Pacing Amplitude: 2.75 V
Lead Channel Setting Pacing Pulse Width: 0.4 ms
Lead Channel Setting Pacing Pulse Width: 0.5 ms
Lead Channel Setting Sensing Sensitivity: 0.5 mV
Pulse Gen Serial Number: 9890691

## 2019-06-19 NOTE — Progress Notes (Unsigned)
ICD Criteria  Current LVEF:25%. Within 12 months prior to implant: No   Heart failure history: Yes, Class II  Cardiomyopathy history: Yes, Non-Ischemic Cardiomyopathy.  Atrial Fibrillation/Atrial Flutter: yes  Ventricular tachycardia history: No.  Cardiac arrest history: No.  History of syndromes with risk of sudden death: No.  Previous ICD: Yes, Reason for ICD:  Primary prevention.  Current ICD indication: Primary  PPM indication: No.  Class I or II Bradycardia indication present: No  Beta Blocker therapy for 3 or more months: Yes, prescribed.   Ace Inhibitor/ARB therapy for 3 or more months: Yes, prescribed.    I have seen Aaron Thompson is a 59 y.o. malepre-procedural and has been seen consideration of ICD reimplant for  prevention of sudden death.  The patient's chart has been reviewed and they meet criteria for ICD implant.  I have had a thorough discussion with the patient reviewing options.  The patient and their family (if available) have had opportunities to ask questions and have them answered. The patient and I have decided together through the [place] [employer] to reimplantt ICD at this time.  Risks, benefits, alternatives to ICD implantation were discussed in detail with the patient today. The patient  understands that the risks include but are not limited to bleeding, infection, pneumothorax, perforation, tamponade, vascular damage, renal failure, MI, stroke, death, inappropriate shocks, and lead dislodgement and   wishes to proceed.

## 2019-06-19 NOTE — Telephone Encounter (Addendum)
Prescription refill request for Xarelto received.   Last office visit: Aaron Thompson  04/26/2019 Weight: 130.6 kg Age: 59 y.o. Scr:  0.75, 06/01/2019 CrCl:  200 ml/min   Prescription refill sent.

## 2019-06-19 NOTE — Progress Notes (Signed)
Wound check appointment. Steri-strips removed. Wound without redness or edema. Incision edges approximated, wound well healed. Normal device function. Thresholds, sensing, and impedances consistent with implant measurements. Device programmed at chronic outputs due to mature leads.Aaron Thompson distribution appropriate for patient and level of activity. No mode switches or ventricular arrhythmias noted. Patient educated about wound care,  shock plan. ROV with Dr Caryl Comes 09/19/19. Next remote scheduled for 09/05/19 and then evry 91 days.[name]

## 2019-06-21 ENCOUNTER — Encounter: Payer: Self-pay | Admitting: Family Medicine

## 2019-06-21 ENCOUNTER — Other Ambulatory Visit: Payer: Self-pay

## 2019-06-21 ENCOUNTER — Ambulatory Visit (INDEPENDENT_AMBULATORY_CARE_PROVIDER_SITE_OTHER): Payer: PPO | Admitting: Family Medicine

## 2019-06-21 VITALS — BP 110/86 | HR 90 | Temp 96.8°F | Ht 75.5 in | Wt 296.0 lb

## 2019-06-21 DIAGNOSIS — Z7189 Other specified counseling: Secondary | ICD-10-CM

## 2019-06-21 DIAGNOSIS — Z0000 Encounter for general adult medical examination without abnormal findings: Secondary | ICD-10-CM

## 2019-06-21 DIAGNOSIS — E1165 Type 2 diabetes mellitus with hyperglycemia: Secondary | ICD-10-CM | POA: Diagnosis not present

## 2019-06-21 DIAGNOSIS — IMO0002 Reserved for concepts with insufficient information to code with codable children: Secondary | ICD-10-CM

## 2019-06-21 DIAGNOSIS — E1159 Type 2 diabetes mellitus with other circulatory complications: Secondary | ICD-10-CM | POA: Diagnosis not present

## 2019-06-21 DIAGNOSIS — I4891 Unspecified atrial fibrillation: Secondary | ICD-10-CM | POA: Diagnosis not present

## 2019-06-21 DIAGNOSIS — E7800 Pure hypercholesterolemia, unspecified: Secondary | ICD-10-CM

## 2019-06-21 LAB — COMPREHENSIVE METABOLIC PANEL
ALT: 19 U/L (ref 0–53)
AST: 19 U/L (ref 0–37)
Albumin: 4.5 g/dL (ref 3.5–5.2)
Alkaline Phosphatase: 50 U/L (ref 39–117)
BUN: 18 mg/dL (ref 6–23)
CO2: 29 mEq/L (ref 19–32)
Calcium: 9.1 mg/dL (ref 8.4–10.5)
Chloride: 103 mEq/L (ref 96–112)
Creatinine, Ser: 0.74 mg/dL (ref 0.40–1.50)
GFR: 108.76 mL/min (ref >60.00)
Glucose, Bld: 193 mg/dL — ABNORMAL HIGH (ref 70–99)
Potassium: 4.7 mEq/L (ref 3.5–5.1)
Sodium: 136 mEq/L (ref 135–145)
Total Bilirubin: 0.6 mg/dL (ref 0.2–1.2)
Total Protein: 7 g/dL (ref 6.0–8.3)

## 2019-06-21 LAB — LIPID PANEL
Cholesterol: 190 mg/dL (ref 0–200)
HDL: 41.6 mg/dL (ref >39.00)
LDL Cholesterol: 120 mg/dL — ABNORMAL HIGH (ref 0–99)
NonHDL: 148.52
Total CHOL/HDL Ratio: 5
Triglycerides: 141 mg/dL (ref 0.0–149.0)
VLDL: 28.2 mg/dL (ref 0.0–40.0)

## 2019-06-21 LAB — HEMOGLOBIN A1C: Hgb A1c MFr Bld: 7.5 % — ABNORMAL HIGH (ref 4.6–6.5)

## 2019-06-21 MED ORDER — POTASSIUM CHLORIDE CRYS ER 20 MEQ PO TBCR: EXTENDED_RELEASE_TABLET | ORAL | Status: DC

## 2019-06-21 NOTE — Patient Instructions (Addendum)
Go to the lab on the way out.   If you have mychart we'll likely use that to update you.    Don't change your meds for now.  Update me as needed.  Recheck in about 4 months.  A1c at the visit.  Take care.  Glad to see you.

## 2019-06-21 NOTE — Progress Notes (Signed)
This visit occurred during the SARS-CoV-2 public health emergency.  Safety protocols were in place, including screening questions prior to the visit, additional usage of staff PPE, and extensive cleaning of exam room while observing appropriate contact time as indicated for disinfecting solutions.  I have personally reviewed the Medicare Annual Wellness questionnaire and have noted 1. The patient's medical and social history 2. Their use of alcohol, tobacco or illicit drugs 3. Their current medications and supplements 4. The patient's functional ability including ADL's, fall risks, home safety risks and hearing or visual             impairment. 5. Diet and physical activities 6. Evidence for depression or mood disorders  The patients weight, height, BMI have been recorded in the chart and visual acuity is per eye clinic.  I have made referrals, counseling and provided education to the patient based review of the above and I have provided the pt with a written personalized care plan for preventive services.  Provider list updated- see scanned forms.  Routine anticipatory guidance given to patient.  See health maintenance. The possibility exists that previously documented standard health maintenance information may have been brought forward from a previous encounter into this note.  If needed, that same information has been updated to reflect the current situation based on today's encounter.    Flu 2020 Shingles due at 60 PNA due at 66 Tetanus 2017 covid vaccine up to date.   Colon cancer screening 2018 Prostate cancer screening wnl 2021 Advance directive-wife designated patient were incapacitated Cognitive function addressed- see scanned forms- and if abnormal then additional documentation follows.   Elevated Cholesterol: Using medications without problems: yes Muscle aches: no Diet compliance: yes Exercise: yes Still on livalo.  Due for labs.    Diabetes:  Using medications without  difficulties:yes Hypoglycemic episodes: rarely, only if prolonged fasting.  Cautions d/w pt.   Hyperglycemic episodes:no Feet problems:no Blood Sugars averaging: usually ~150 eye exam within last year: yes Still on metformin and jardiance.    AF.  He had pacer placed.  No complications.  Skin is healing in the meantime.  Still on tikisyn and carvedilol.  Anticoagulated.  No ADE on med.  No CP.  Not SOB.  No BLE edema.  He has occ abd bloating that improves with urination.  He tends not to have BLE edema.    Ear popping recently but not bothersome now.    PMH and SH reviewed  Meds, vitals, and allergies reviewed.   ROS: Per HPI.  Unless specifically indicated otherwise in HPI, the patient denies:  General: fever. Eyes: acute vision changes ENT: sore throat Cardiovascular: chest pain Respiratory: SOB GI: vomiting GU: dysuria Musculoskeletal: acute back pain Derm: acute rash Neuro: acute motor dysfunction Psych: worsening mood Endocrine: polydipsia Heme: bleeding Allergy: hayfever  GEN: nad, alert and oriented HEENT: ncat NECK: supple w/o LA CV: rrr. PULM: ctab, no inc wob ABD: soft, +bs EXT: no edema SKIN: no acute rash  Diabetic foot exam: Normal inspection No skin breakdown No calluses  Normal DP pulses Normal sensation to light touch and monofilament Nails normal

## 2019-06-22 ENCOUNTER — Other Ambulatory Visit (HOSPITAL_COMMUNITY): Payer: Self-pay | Admitting: *Deleted

## 2019-06-22 MED ORDER — EMPAGLIFLOZIN 10 MG PO TABS: 10.0000 mg | ORAL_TABLET | Freq: Every day | ORAL | 3 refills | Status: DC

## 2019-06-25 NOTE — Assessment & Plan Note (Signed)
He had pacer placed.  No complications.  Skin is healing in the meantime.  Still on tikisyn and carvedilol.  Anticoagulated with Xarelto.  No ADE on med.  No CP.  Not SOB.  No BLE edema.  He has occ abd bloating that improves with urination.  He tends not to have BLE edema.   Continue as is.  He agrees.

## 2019-06-25 NOTE — Assessment & Plan Note (Signed)
Continue Livalo.  Continue work on diet and exercise.  See notes on labs.

## 2019-06-25 NOTE — Assessment & Plan Note (Signed)
Flu 2020 Shingles due at 61 PNA due at 66 Tetanus 2017 covid vaccine up to date.   Colon cancer screening 2018 Prostate cancer screening wnl 2021 Advance directive-wife designated patient were incapacitated Cognitive function addressed- see scanned forms- and if abnormal then additional documentation follows.

## 2019-06-25 NOTE — Assessment & Plan Note (Signed)
History of, with A1c previously above 7.  Still on Metformin and Jardiance.  Blood sugars usually averaging around 150 recently.  See notes on labs.  No change in meds at this point.

## 2019-06-25 NOTE — Assessment & Plan Note (Signed)
Advance directive-wife designated patient were incapacitated. 

## 2019-06-26 ENCOUNTER — Telehealth: Payer: Self-pay | Admitting: Cardiovascular Disease

## 2019-06-26 ENCOUNTER — Encounter: Payer: Self-pay | Admitting: Family Medicine

## 2019-06-26 NOTE — Telephone Encounter (Signed)
Patient called back. Made patient's son an appointment with Dr. [name]. Will let Dr. [name] know.

## 2019-06-26 NOTE — Telephone Encounter (Signed)
Left message for patient to call back  

## 2019-06-26 NOTE — Telephone Encounter (Signed)
   Went to chart to check who called pt. Transferred to [facility]

## 2019-07-09 ENCOUNTER — Other Ambulatory Visit: Payer: Self-pay | Admitting: Family Medicine

## 2019-07-26 ENCOUNTER — Other Ambulatory Visit: Payer: Self-pay | Admitting: Cardiovascular Disease

## 2019-08-09 ENCOUNTER — Other Ambulatory Visit: Payer: Self-pay | Admitting: Family Medicine

## 2019-08-10 NOTE — Telephone Encounter (Signed)
Sent. Thanks.   

## 2019-09-04 NOTE — Progress Notes (Signed)
Date:  09/10/2019   ID:  Aaron Thompson, DOB 06-Mar-1960, MRN [id number]  PCP:  [name], MD  Cardiologist:  [name] Electrophysiologist:  None   Chief Complaint:  F/U CHF   History of Present Illness:    Aaron Thompson is a 59 y.o. male  He has long standing PAF and CHF non ischemic. First diagnosed in 2013 Post AF ablation  2017 and on Tikosyn  , LBBB OSA unable to wear CPAP no history of CAD  On optimum Rx with beta blocker, entresto and diuretic Has AICD/CRT device as well Interrogation indicates BiV pacing over 95% of time and maintaining NSR. 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 Wants to do hospitality and work with trains  I see him as a patient as well For palpitations and anxiety  Since last Thursday has been having more palpitations. Feels them and symptomatic with more dyspnea Home tracing showed PVCls not PAF  The patient does not have symptoms concerning for COVID-19 infection (fever, chills, cough, or new shortness of breath).   PVC counter not recorded for device No PAF ECG today confirms PVC;s rate 84 QT 452/534   Prior CV studies:   The following studies were reviewed today:  Echo 02/02/18 see HPI EF 25-30% Notes from CHF clincic AICD Interrogations PACE-ART   Past Medical History:  Diagnosis Date  . AICD (automatic cardioverter/defibrillator) present   . Anxiety state, unspecified   . Calculus of kidney 1981   "passed it on my own"  . Chronic systolic CHF (congestive heart failure) ([place])   . Dermatophytosis of the body   . DJD (degenerative joint disease)   . Esophageal reflux   . Hx of colonic polyps   . Hypertension   . LBBB (left bundle branch block)    intermittent  . Non-ischemic cardiomyopathy ([place])    a. cath 2013: minor nonobstructive CAD.  [name] OSA (obstructive sleep apnea)    he can't  tolerate CPAP.   [name] Osteoarthrosis, unspecified whether generalized or localized, hand   . Other chronic nonalcoholic liver disease    fatty liver  . PAF (paroxysmal atrial fibrillation) ([place])    a. s/p DCCV 6/11; previously on Pradaxa;  b. Event Monitor 2012->No PAF;  c. 09/2011 s/p DCCV ->Xarelto started. d. 03/2014: inappropriate ICD shocks for AF-RVR, started on Tikosyn, Xarelto restarted.  . Type II diabetes mellitus ([place])   . Unspecified hearing loss    no hearing aid   Past Surgical History:  Procedure Laterality Date  . Abdominal US  01/2007   Fatty liver, no gallstones  . APPENDECTOMY    . ATRIAL TACH ABLATION  09/2015  . BI-VENTRICULAR IMPLANTABLE CARDIOVERTER DEFIBRILLATOR [name] 02/14/2013   STJ CRTD implanted by Dr Caryl Comes  . BILATERAL KNEE ARTHROSCOPY    . BIV ICD GENERATOR CHANGEOUT [name] 06/06/2019   Procedure: BIV ICD GENERATOR CHANGEOUT;  Surgeon: Deboraha Sprang, MD;  Location: [facility];  Service: Cardiovascular;  Laterality: [name];  . CARDIOVERSION  09/29/2011   Procedure: CARDIOVERSION;  Surgeon: Thayer Headings, MD;  Location: [facility];  Service: Cardiovascular;  Laterality: [name];  . COLONOSCOPY WITH PROPOFOL [name] 10/26/2016   Procedure: COLONOSCOPY WITH PROPOFOL;  Surgeon: Irene Shipper, MD;  Location: [facility];  Service: Endoscopy;  Laterality: [name];  . DOPPLER ECHOCARDIOGRAPHY  11/2009  Decreased EF of 35-40%  . ELECTROPHYSIOLOGIC STUDY [name] 10/14/2015   Procedure: Atrial Fibrillation Ablation;  Surgeon: [name], MD;  Location: [facility] [facility];  Service: Cardiovascular;  Laterality: [name];  . LEFT HEART CATHETERIZATION WITH CORONARY ANGIOGRAM [name] 08/18/2011   Procedure: LEFT HEART CATHETERIZATION WITH CORONARY ANGIOGRAM;  Surgeon: Rajat Staver M Martinique, MD;  Location: [facility];  Service: Cardiovascular;  Laterality: [name];  . NECK SURGERY     plate/fusion  . Stress Cardiolite  08/2005   Normal, EF 42%  . UPPER GASTROINTESTINAL ENDOSCOPY  08/2006   GERD     Current  Meds  Medication Sig  . acetaminophen (TYLENOL) 325 MG tablet Take 650 mg by mouth every 6 (six) hours as needed for mild pain or moderate pain.  . carvedilol (COREG) 25 MG tablet TAKE 2 TABLETS(50 MG) BY MOUTH TWICE DAILY WITH A MEAL  . cetirizine (ZYRTEC) 10 MG tablet Take 10 mg by mouth daily.   [name] dofetilide (TIKOSYN) 500 MCG capsule TAKE 1 CAPSULE(500 MCG) BY MOUTH TWICE DAILY  . empagliflozin (JARDIANCE) 10 MG TABS tablet Take 1 tablet (10 mg total) by mouth daily.  [name] glipiZIDE (GLUCOTROL XL) 10 MG 24 hr tablet TAKE 1 TABLET(10 MG) BY MOUTH DAILY WITH BREAKFAST  . glucose blood (ONE TOUCH ULTRA TEST) test strip CHECK GLUCOSE TWICE DAILY AND AS NEEDED, DX. E11.65 (UNCONTROLLED DM)  . metFORMIN (GLUCOPHAGE-XR) 500 MG 24 hr tablet TAKE 3 TABLETS(1500 MG) BY MOUTH DAILY WITH BREAKFAST  . Multiple Vitamin (MULTIVITAMIN) tablet Take 1 tablet by mouth daily.    [name] Bicarbonate (ZEGERID OTC) 20-1100 MG CAPS Take 1 capsule by mouth 2 (two) times daily.   . Pitavastatin Calcium (LIVALO) 1 MG TABS Take 1 mg by mouth every other day. Livalo  . potassium chloride SA (KLOR-CON) 20 MEQ tablet TAKE 2 TABLETS(40 MEQ) BY MOUTH DAILY  . sacubitril-valsartan (ENTRESTO) 97-103 MG Take 1 tablet by mouth 2 (two) times daily. Pt need to keep upcoming appt in June for further refills  . spironolactone (ALDACTONE) 25 MG tablet TAKE 1 TABLET(25 MG) BY MOUTH DAILY  . traMADol (ULTRAM) 50 MG tablet TAKE 1 TABLET(50 MG) BY MOUTH EVERY 12 HOURS AS NEEDED FOR PAIN  . XARELTO 20 MG TABS tablet TAKE 1 TABLET(20 MG) BY MOUTH DAILY WITH SUPPER     Allergies:   Atorvastatin   Social History   Tobacco Use  . Smoking status: Never Smoker  . Smokeless tobacco: Former Systems developer    Types: Snuff  . Tobacco comment: 02/14/2013 "quit snuff in 2006"  Vaping Use  . Vaping Use: Never used  Substance Use Topics  . Alcohol use: No    Alcohol/week: 0.0 standard drinks  . Drug use: No     Family Hx: The patient's  family history includes Cirrhosis in his maternal uncle; Colon cancer in his paternal grandmother; Colon polyps in his sister; Diabetes in his brother, brother, brother, and mother; Heart attack in his paternal grandfather; Heart failure in his father and mother; Hypertension in his father and mother; Kidney disease in his brother; Leukemia in his sister; Multiple myeloma in his sister; Other in his sister; Prostate cancer in his brother and father; Stroke in his paternal grandmother. There is no history of Esophageal cancer, Stomach cancer, or Pancreatic cancer.  ROS:   Please see the history of present illness.     All other systems reviewed and are negative.   Labs/Other Tests and Data Reviewed:    Recent Labs:  06/01/2019: Hemoglobin 15.9; Magnesium 2.2; Platelets 178 06/21/2019: ALT 19; BUN 18; Creatinine, Ser 0.74; Potassium 4.7; Sodium 136   Recent Lipid Panel Lab Results  Component Value Date/Time   CHOL 190 06/21/2019 09:45 AM   TRIG 141.0 06/21/2019 09:45 AM   HDL 41.60 06/21/2019 09:45 AM   CHOLHDL 5 06/21/2019 09:45 AM   LDLCALC 120 (H) 06/21/2019 09:45 AM   LDLDIRECT 97.0 06/16/2016 07:55 AM    Wt Readings from Last 3 Encounters:  09/10/19 293 lb 6.4 oz (133.1 kg)  06/21/19 296 lb (134.3 kg)  06/06/19 288 lb (130.6 kg)     Objective:    Vital Signs:  BP 120/60   Pulse (!) 50   Ht 6' 3.5" (1.918 m)   Wt 293 lb 6.4 oz (133.1 kg)   SpO2 94%   BMI 36.19 kg/m    Affect appropriate Healthy:  appears stated age HEENT: normal Neck supple with no adenopathy JVP normal no bruits no thyromegaly Lungs clear with no wheezing and good diaphragmatic motion Heart:  S1/S2 no murmur, no rub, gallop or click PMI enlarged AICD under left clavicle  Abdomen: benighn, BS positve, no tenderness, no AAA no bruit.  No HSM or HJR Distal pulses intact with no bruits No edema Neuro non-focal Skin warm and dry No muscular weakness   ASSESSMENT & PLAN:    1.  CHF:  Non ischemic  compliant with meds on maximum goal directed RX. Functional class 2  EF 25-30% by TTE 02/02/18  2. PAF:  Post ablation 2017 on tikosyn Mg and BMET normal  05/19/18  F/U EP 3. CRT-AICD:  No defibrillations Biv pacing over 95% of time stable f/u EP 4. OSA:  Consider f/u with dentist to try oral device cannot tolerate CPAP  Discussed weight loss 5. DM:  On jardiance f/u primary target A1C 6.5 or less 6. Anticoagulation No bleeding issues continue xarelto    7. PVC;s  Symptomatic will get device interogated on coreg and Tikosyn already F/U with EP to consider different AAT doubt he would be a candidate off the bat for PVC/mapping/ ablation   Medication Adjustments/Labs and Tests Ordered: Current medicines are reviewed at length with the patient today.  Concerns regarding medicines are outlined above.  Tests Ordered:  Refer to EP and interrogate device for % PVC burden  Labs BMET/Mg/ BNP  Medication Changes: No orders of the defined types were placed in this encounter.   Disposition:  Follow up in 6 months f/U with EP ASAP Spoke with scheduler and Tomi Bamberger personally to see if SK Can see this am   Signed, Jenkins Rouge, MD  09/10/2019 9:18 AM    [place]

## 2019-09-05 ENCOUNTER — Ambulatory Visit (INDEPENDENT_AMBULATORY_CARE_PROVIDER_SITE_OTHER): Payer: PPO | Admitting: *Deleted

## 2019-09-05 ENCOUNTER — Other Ambulatory Visit: Payer: Self-pay | Admitting: Cardiovascular Disease

## 2019-09-05 DIAGNOSIS — I5022 Chronic systolic (congestive) heart failure: Secondary | ICD-10-CM | POA: Diagnosis not present

## 2019-09-06 ENCOUNTER — Telehealth: Payer: Self-pay

## 2019-09-06 LAB — CUP PACEART REMOTE DEVICE CHECK
Battery Remaining Longevity: 74 mo
Battery Remaining Longevity: 74 mo
Battery Remaining Percentage: 90 %
Battery Remaining Percentage: 90 %
Battery Voltage: 3.11 V
Battery Voltage: 3.1 V
Brady Statistic AP VP Percent: 1 %
Brady Statistic AP VP Percent: 1 %
Brady Statistic AP VS Percent: 1 %
Brady Statistic AP VS Percent: 1 %
Brady Statistic AS VP Percent: 98 %
Brady Statistic AS VP Percent: 98 %
Brady Statistic AS VS Percent: 1 %
Brady Statistic AS VS Percent: 1 %
Brady Statistic RA Percent Paced: 1 %
Brady Statistic RA Percent Paced: 1 %
Date Time Interrogation Session: 20210811020016
Date Time Interrogation Session: 20210812124554
HighPow Impedance: 81 Ohm
HighPow Impedance: 81 Ohm
HighPow Impedance: 82 Ohm
HighPow Impedance: 82 Ohm
Implantable Lead Implant Date: 20150121
Implantable Lead Implant Date: 20150121
Implantable Lead Implant Date: 20150121
Implantable Lead Implant Date: 20150121
Implantable Lead Implant Date: 20150121
Implantable Lead Implant Date: 20150121
Implantable Lead Location: 753858
Implantable Lead Location: 753859
Implantable Lead Location: 753860
Implantable Lead Location: 753858
Implantable Lead Location: 753859
Implantable Lead Location: 753860
Implantable Lead Model: 7122
Implantable Lead Model: 7122
Implantable Pulse Generator Implant Date: 20210512
Implantable Pulse Generator Implant Date: 20210512
Lead Channel Impedance Value: 510 Ohm
Lead Channel Impedance Value: 560 Ohm
Lead Channel Impedance Value: 950 Ohm
Lead Channel Impedance Value: 510 Ohm
Lead Channel Impedance Value: 540 Ohm
Lead Channel Impedance Value: 930 Ohm
Lead Channel Pacing Threshold Amplitude: 0.875 V
Lead Channel Pacing Threshold Amplitude: 1.125 V
Lead Channel Pacing Threshold Amplitude: 1.875 V
Lead Channel Pacing Threshold Amplitude: 0.75 V
Lead Channel Pacing Threshold Amplitude: 1.125 V
Lead Channel Pacing Threshold Amplitude: 2.125 V
Lead Channel Pacing Threshold Pulse Width: 0.4 ms
Lead Channel Pacing Threshold Pulse Width: 0.4 ms
Lead Channel Pacing Threshold Pulse Width: 0.5 ms
Lead Channel Pacing Threshold Pulse Width: 0.4 ms
Lead Channel Pacing Threshold Pulse Width: 0.4 ms
Lead Channel Pacing Threshold Pulse Width: 0.5 ms
Lead Channel Sensing Intrinsic Amplitude: 12 mV
Lead Channel Sensing Intrinsic Amplitude: 5 mV
Lead Channel Sensing Intrinsic Amplitude: 12 mV
Lead Channel Sensing Intrinsic Amplitude: 5 mV
Lead Channel Setting Pacing Amplitude: 1.875
Lead Channel Setting Pacing Amplitude: 2.125
Lead Channel Setting Pacing Amplitude: 2.875
Lead Channel Setting Pacing Amplitude: 1.75 V
Lead Channel Setting Pacing Amplitude: 2.125
Lead Channel Setting Pacing Amplitude: 3.125
Lead Channel Setting Pacing Pulse Width: 0.4 ms
Lead Channel Setting Pacing Pulse Width: 0.5 ms
Lead Channel Setting Pacing Pulse Width: 0.4 ms
Lead Channel Setting Pacing Pulse Width: 0.5 ms
Lead Channel Setting Sensing Sensitivity: 0.5 mV
Lead Channel Setting Sensing Sensitivity: 0.5 mV
Pulse Gen Serial Number: 9890691
Pulse Gen Serial Number: 9890691

## 2019-09-06 NOTE — Telephone Encounter (Signed)
The pt states he feel like his heart doing extra beats. He felt some SOB. The pt is sending transmission. Transmissions received. I let him speak with device nurse Amy, rn.

## 2019-09-06 NOTE — Telephone Encounter (Signed)
Spoke with pt.  He reports he came inside about 10 minutes ago from mowing his and his neighbors yards.  He was not feeling well, felt like his heart was beating fast and had SOB.  BP 133/77 HR 59, when he rechecked the machine would not register a heart rate.  He sent a manual transmission from ICD.  At time of call pt SOB seems improved, transmission reviewed- HR AS/BP 110.    Advised pt to hydrate and cool off.  If problem persists call us back.

## 2019-09-10 ENCOUNTER — Encounter: Payer: Self-pay | Admitting: Cardiovascular Disease

## 2019-09-10 ENCOUNTER — Encounter: Payer: Self-pay | Admitting: Internal Medicine

## 2019-09-10 ENCOUNTER — Other Ambulatory Visit: Payer: Self-pay

## 2019-09-10 ENCOUNTER — Ambulatory Visit: Payer: PPO | Admitting: Cardiovascular Disease

## 2019-09-10 ENCOUNTER — Ambulatory Visit (INDEPENDENT_AMBULATORY_CARE_PROVIDER_SITE_OTHER): Payer: PPO | Admitting: Internal Medicine

## 2019-09-10 VITALS — BP 120/60 | HR 50 | Ht 75.5 in | Wt 293.0 lb

## 2019-09-10 VITALS — BP 120/60 | HR 50 | Ht 75.5 in | Wt 293.4 lb

## 2019-09-10 DIAGNOSIS — I480 Paroxysmal atrial fibrillation: Secondary | ICD-10-CM

## 2019-09-10 DIAGNOSIS — Z95810 Presence of automatic (implantable) cardiac defibrillator: Secondary | ICD-10-CM | POA: Diagnosis not present

## 2019-09-10 DIAGNOSIS — I5043 Acute on chronic combined systolic (congestive) and diastolic (congestive) heart failure: Secondary | ICD-10-CM

## 2019-09-10 DIAGNOSIS — I428 Other cardiomyopathies: Secondary | ICD-10-CM

## 2019-09-10 DIAGNOSIS — I493 Ventricular premature depolarization: Secondary | ICD-10-CM

## 2019-09-10 LAB — BASIC METABOLIC PANEL
BUN/Creatinine Ratio: 29 — ABNORMAL HIGH (ref 9–20)
BUN: 22 mg/dL (ref 6–24)
CO2: 22 mmol/L (ref 20–29)
Calcium: 9.1 mg/dL (ref 8.7–10.2)
Chloride: 102 mmol/L (ref 96–106)
Creatinine, Ser: 0.76 mg/dL (ref 0.76–1.27)
GFR calc Af Amer: 117 mL/min/{1.73_m2} (ref >59)
GFR calc non Af Amer: 101 mL/min/{1.73_m2} (ref >59)
Glucose: 183 mg/dL — ABNORMAL HIGH (ref 65–99)
Potassium: 4.8 mmol/L (ref 3.5–5.2)
Sodium: 138 mmol/L (ref 134–144)

## 2019-09-10 LAB — PRO B NATRIURETIC PEPTIDE: NT-Pro BNP: 63 pg/mL (ref 0–210)

## 2019-09-10 LAB — MAGNESIUM: Magnesium: 2.1 mg/dL (ref 1.6–2.3)

## 2019-09-10 MED ORDER — ALPRAZOLAM 0.25 MG PO TABS
0.2500 mg | ORAL_TABLET | Freq: Every day | ORAL | 1 refills | Status: DC | PRN
Start: 2019-09-10 — End: 2019-11-26

## 2019-09-10 MED ORDER — MAGNESIUM OXIDE 400 MG PO CAPS: 400.0000 mg | ORAL_CAPSULE | Freq: Every day | ORAL | 3 refills | Status: AC

## 2019-09-10 NOTE — Patient Instructions (Addendum)
Medication Instructions:  *If you need a refill on your cardiac medications before your next appointment, please call your pharmacy*  Lab Work: Your physician recommends that you have lab work today. BMET, BNP, Mg  If you have labs (blood work) drawn today and your tests are completely normal, you will receive your results only by: [name] MyChart Message (if you have MyChart) OR . A paper copy in the mail If you have any lab test that is abnormal or we need to change your treatment, we will call you to review the results.  Testing/Procedures: None ordered today.  Follow-Up: At [facility], you and your health needs are our priority.  As part of our continuing mission to provide you with exceptional heart care, we have created designated Provider Care Teams.  These Care Teams include your primary Cardiologist (physician) and Advanced Practice Providers (APPs -  Physician Assistants and Nurse Practitioners) who all work together to provide you with the care you need, when you need it.  We recommend signing up for the patient portal called "MyChart".  Sign up information is provided on this After Visit Summary.  MyChart is used to connect with patients for Virtual Visits (Telemedicine).  Patients are able to view lab/test results, encounter notes, upcoming appointments, etc.  Non-urgent messages can be sent to your provider as well.   To learn more about what you can do with MyChart, go to [email or web address].    Your next appointment:   6 month(s)  The format for your next appointment:   In Person  Provider:   You may see Dr. [name] or one of the following Advanced Practice Providers on your designated Care Team:    Truitt Merle, NP  Cecilie Kicks, NP  Kathyrn Drown, NP  Your physician recommends that you schedule a follow-up appointment as soon as possible.

## 2019-09-10 NOTE — Progress Notes (Signed)
Remote ICD transmission.   

## 2019-09-10 NOTE — Telephone Encounter (Signed)
Called patient to let him know that medication xanax will be sent to patient's pharmacy. Patient verbalized understanding.

## 2019-09-10 NOTE — Patient Instructions (Signed)
Medication Instructions:  Your physician has recommended you make the following change in your medication:   Begin MagOx 400mg  1 tablet daily by mouth  *If you need a refill on your cardiac medications before your next appointment, please call your pharmacy*   Lab Work: None ordered.  If you have labs (blood work) drawn today and your tests are completely normal, you will receive your results only by: [name] MyChart Message (if you have MyChart) OR . A paper copy in the mail If you have any lab test that is abnormal or we need to change your treatment, we will call you to review the results.   Testing/Procedures: None ordered.    Follow-Up: At [facility], you and your health needs are our priority.  As part of our continuing mission to provide you with exceptional heart care, we have created designated Provider Care Teams.  These Care Teams include your primary Cardiologist (physician) and Advanced Practice Providers (APPs -  Physician Assistants and Nurse Practitioners) who all work together to provide you with the care you need, when you need it.  We recommend signing up for the patient portal called "MyChart".  Sign up information is provided on this After Visit Summary.  MyChart is used to connect with patients for Virtual Visits (Telemedicine).  Patients are able to view lab/test results, encounter notes, upcoming appointments, etc.  Non-urgent messages can be sent to your provider as well.   To learn more about what you can do with MyChart, go to [email or web address].    Your next appointment:   As scheduled with Dr Caryl Comes

## 2019-09-10 NOTE — Progress Notes (Signed)
Patient Care Team: [name], MD as PCP - General (Family Medicine) All  HPI  Aaron Thompson is a 59 y.o. male Seen in followup for CRT- ICD implanted for nonischemic cardiomyopathy and left bundle branch block.  He was noted on  his device to have atrial fibrillation and he was started on Rivaroxaban for stroke reduction prophylaxis   Chose to stop his anticoagulation and to return to work. March 2016 he had ICD shocks associated with very rapid atrial fibrillation.  Rx w dofetilide therapy  And Rivaroxaban; underwent catheter ablation of his atrial fibrillation (JA) 9/17  Seen by Dr. Raynaldo Opitz this morning because of palpitations.  Home monitoring demonstrated increased PVC  He is beginning last Thursday.  Notes no change in medications no extra exposure to stimulants, no functional changes.   DATE TEST EF   9/17 Echo  40 %   11/18 Echo  20-25%   1/20  Echo  25-30%       Date Cr K Hgb  3/21 0.69 4.7 15.7   5/21 0.74 4.7 15.9      Past Medical History:  Diagnosis Date  . AICD (automatic cardioverter/defibrillator) present   . Anxiety state, unspecified   . Calculus of kidney 1981   "passed it on my own"  . Chronic systolic CHF (congestive heart failure) ([place])   . Dermatophytosis of the body   . DJD (degenerative joint disease)   . Esophageal reflux   . Hx of colonic polyps   . Hypertension   . LBBB (left bundle branch block)    intermittent  . Non-ischemic cardiomyopathy ([place])    a. cath 2013: minor nonobstructive CAD.  [name] OSA (obstructive sleep apnea)    he can't tolerate CPAP.   [name] Osteoarthrosis, unspecified whether generalized or localized, hand   . Other chronic nonalcoholic liver disease    fatty liver  . PAF (paroxysmal atrial fibrillation) ([place])    a. s/p DCCV 6/11; previously on Pradaxa;  b. Event Monitor 2012->No PAF;  c. 09/2011 s/p DCCV ->Xarelto started. d. 03/2014: inappropriate ICD shocks for AF-RVR, started on Tikosyn,  Xarelto restarted.  . Type II diabetes mellitus ([place])   . Unspecified hearing loss    no hearing aid    Past Surgical History:  Procedure Laterality Date  . Abdominal US  01/2007   Fatty liver, no gallstones  . APPENDECTOMY    . ATRIAL TACH ABLATION  09/2015  . BI-VENTRICULAR IMPLANTABLE CARDIOVERTER DEFIBRILLATOR [name] 02/14/2013   STJ CRTD implanted by Dr Caryl Comes  . BILATERAL KNEE ARTHROSCOPY    . BIV ICD GENERATOR CHANGEOUT [name] 06/06/2019   Procedure: BIV ICD GENERATOR CHANGEOUT;  Surgeon: Deboraha Sprang, MD;  Location: [place] [facility];  Service: Cardiovascular;  Laterality: [name];  . CARDIOVERSION  09/29/2011   Procedure: CARDIOVERSION;  Surgeon: Thayer Headings, MD;  Location: [place];  Service: Cardiovascular;  Laterality: [name];  . COLONOSCOPY WITH PROPOFOL [name] 10/26/2016   Procedure: COLONOSCOPY WITH PROPOFOL;  Surgeon: Irene Shipper, MD;  Location: [facility];  Service: Endoscopy;  Laterality: [name];  . DOPPLER ECHOCARDIOGRAPHY  11/2009   Decreased EF of 35-40%  . ELECTROPHYSIOLOGIC STUDY [name] 10/14/2015   Procedure: Atrial Fibrillation Ablation;  Surgeon: [name], MD;  Location: [place] [facility];  Service: Cardiovascular;  Laterality: [name];  . LEFT HEART CATHETERIZATION WITH CORONARY ANGIOGRAM [name] 08/18/2011   Procedure: LEFT HEART CATHETERIZATION WITH CORONARY ANGIOGRAM;  Surgeon: Peter M Martinique, MD;  Location: [place] [facility];  Service: Cardiovascular;  Laterality: [name];  . NECK SURGERY     plate/fusion  . Stress Cardiolite  08/2005   Normal, EF 42%  . UPPER GASTROINTESTINAL ENDOSCOPY  08/2006   GERD    Current Outpatient Medications  Medication Sig Dispense Refill  . acetaminophen (TYLENOL) 325 MG tablet Take 650 mg by mouth every 6 (six) hours as needed for mild pain or moderate pain.    . carvedilol (COREG) 25 MG tablet TAKE 2 TABLETS(50 MG) BY MOUTH TWICE DAILY WITH A MEAL 360 tablet 2  . cetirizine (ZYRTEC) 10 MG tablet Take 10 mg by mouth daily.     [name] dofetilide (TIKOSYN)  500 MCG capsule TAKE 1 CAPSULE(500 MCG) BY MOUTH TWICE DAILY 180 capsule 3  . empagliflozin (JARDIANCE) 10 MG TABS tablet Take 1 tablet (10 mg total) by mouth daily. 90 tablet 3  . glipiZIDE (GLUCOTROL XL) 10 MG 24 hr tablet TAKE 1 TABLET(10 MG) BY MOUTH DAILY WITH BREAKFAST 30 tablet 11  . glucose blood (ONE TOUCH ULTRA TEST) test strip CHECK GLUCOSE TWICE DAILY AND AS NEEDED, DX. E11.65 (UNCONTROLLED DM) 100 each 5  . metFORMIN (GLUCOPHAGE-XR) 500 MG 24 hr tablet TAKE 3 TABLETS(1500 MG) BY MOUTH DAILY WITH BREAKFAST 90 tablet 5  . Multiple Vitamin (MULTIVITAMIN) tablet Take 1 tablet by mouth daily.      [name] Bicarbonate (ZEGERID OTC) 20-1100 MG CAPS Take 1 capsule by mouth 2 (two) times daily.     . Pitavastatin Calcium (LIVALO) 1 MG TABS Take 1 mg by mouth every other day. Livalo    . potassium chloride SA (KLOR-CON) 20 MEQ tablet TAKE 2 TABLETS(40 MEQ) BY MOUTH DAILY    . sacubitril-valsartan (ENTRESTO) 97-103 MG Take 1 tablet by mouth 2 (two) times daily. Pt need to keep upcoming appt in June for further refills 180 tablet 2  . spironolactone (ALDACTONE) 25 MG tablet TAKE 1 TABLET(25 MG) BY MOUTH DAILY 90 tablet 2  . traMADol (ULTRAM) 50 MG tablet TAKE 1 TABLET(50 MG) BY MOUTH EVERY 12 HOURS AS NEEDED FOR PAIN 60 tablet 2  . XARELTO 20 MG TABS tablet TAKE 1 TABLET(20 MG) BY MOUTH DAILY WITH SUPPER 30 tablet 5   No current facility-administered medications for this visit.    Allergies  Allergen Reactions  . Atorvastatin     Muscle pain     Review of Systems negative except from HPI and PMH  Physical Exam  BP 120/60n  Pulse 50( palpated) Well developed and well nourished in no acute distress HENT normal Neck supple with JVP-flat Clear Device pocket well healed; without hematoma or erythema.  There is no tethering  Regular rate and rhythm, no   murmur Abd-soft with active BS No Clubbing cyanosis   edema Skin-warm and dry A & Oriented  Grossly normal sensory and  motor function  ECG  Sinus @ 84 14/11/45 PVCs RBBB inferior axis Fractionated with delayed intrinsicoid deflection   Device interrogation demonstrates a PVC burden of less than 1%.  Please capture electrograms demonstrates that approximately 30% of his PVCs are not counted as PVCs as a occur within the time window of biventricular pacing spike Assessment and  Plan  Nonischemic cardiomyopathy  Congestive heart failure- chronic-systolic  PVC RBBB inferior axis   Hypertension  Implantable defibrillator-CRT   Sleep disorder breathing  Atrial fibrillation s/p PVI-JA 2017  Inappropriate ICD discharges  Euvolemic continue current meds  No intercurrent atrial fibrillation or flutter  On Anticoagulation;  No bleeding issues     No interval atrial fibrillation; recent flurry of PVCs which he times to last week.  Overall burden since May was less than 1% last week but now is 2% and on monitoring is having ectopy somewhere between bigeminy and every 6 beat with occasional couplets.  No clear triggers.  For now we will begin him on mag oxide for one potential benefits there may be.  We will check magnesium and potassium.  And have decided to wait for a week or 2 prior to making a decision regarding any other medications, which would probably be ranolazine.  We discussed the possibility of catheter ablation in the event that they persist-- but not all that sanguine given the fractionation and delayed intrinsicoid deflection suggesting a mid myocardial focus

## 2019-09-12 NOTE — Addendum Note (Signed)
Addended by: Gar Ponto on: 09/12/2019 09:52 AM   Modules accepted: Orders

## 2019-09-16 ENCOUNTER — Other Ambulatory Visit: Payer: Self-pay | Admitting: Family Medicine

## 2019-09-19 ENCOUNTER — Encounter: Payer: PPO | Admitting: Internal Medicine

## 2019-09-27 ENCOUNTER — Other Ambulatory Visit: Payer: Self-pay

## 2019-09-27 ENCOUNTER — Telehealth (INDEPENDENT_AMBULATORY_CARE_PROVIDER_SITE_OTHER): Payer: PPO | Admitting: Internal Medicine

## 2019-09-27 ENCOUNTER — Encounter: Payer: Self-pay | Admitting: Internal Medicine

## 2019-09-27 VITALS — BP 127/71 | HR 71 | Ht 76.0 in | Wt 289.0 lb

## 2019-09-27 DIAGNOSIS — I5022 Chronic systolic (congestive) heart failure: Secondary | ICD-10-CM

## 2019-09-27 DIAGNOSIS — I428 Other cardiomyopathies: Secondary | ICD-10-CM | POA: Diagnosis not present

## 2019-09-27 DIAGNOSIS — I493 Ventricular premature depolarization: Secondary | ICD-10-CM | POA: Diagnosis not present

## 2019-09-27 DIAGNOSIS — Z95810 Presence of automatic (implantable) cardiac defibrillator: Secondary | ICD-10-CM | POA: Diagnosis not present

## 2019-09-27 NOTE — Progress Notes (Signed)
Electrophysiology TeleHealth Note   Due to national recommendations of social distancing due to COVID 19, an audio/video telehealth visit is felt to be most appropriate for this patient at this time.  See MyChart message from today for the patient's consent to telehealth for [facility].   Date:  09/27/2019   ID:  Aaron Thompson, DOB [birth year], MRN [id number]  Location: patient's home  Provider location: [address], [place]  Evaluation Performed: Follow-up visit  PCP:  [name], MD  Cardiologist:     Electrophysiologist:  SK   Chief Complaint:   PVCs  History of Present Illness:    Aaron Thompson is a 59 y.o. male  who presents via audio/video conferencing for a telehealth visit today.  Since last being seen in our clinic Chose to stop his anticoagulation and to return to work. March 2016 he had ICD shocks associated with very rapid atrial fibrillation.  Rx w dofetilide therapy  And Rivaroxaban; underwent catheter ablation of his atrial fibrillation (JA) 9/17  Seen 8/21 PVC; started on Mg the patient reports  much improvement    Some PVCs yday w dyspnea but frequency 1/10 instead of 1/4  Tolerated better.   Been Working out in the yard, with Marine scientist.   DOE at about 1/2 mile ; no swelling       DATE TEST EF   9/17 Echo 40 %   11/18 Echo 20-25%   1/20 Echo 25-30%       Date Cr K Hgb  3/21 0.69 4.7 15.7  5/21 0.74 4.7 15.9     The patient denies symptoms of fevers, chills, cough, or new SOB worrisome for COVID 19.    Past Medical History:  Diagnosis Date   AICD (automatic cardioverter/defibrillator) present    Anxiety state, unspecified    Calculus of kidney 1981   "passed it on my own"   Chronic systolic CHF (congestive heart failure) (HCC)    Dermatophytosis of the body    DJD (degenerative joint disease)    Esophageal reflux    Hx of colonic polyps    Hypertension    LBBB  (left bundle branch block)    intermittent   Non-ischemic cardiomyopathy ([place])    a. cath 2013: minor nonobstructive CAD.   OSA (obstructive sleep apnea)    he can't tolerate CPAP.    Osteoarthrosis, unspecified whether generalized or localized, hand    Other chronic nonalcoholic liver disease    fatty liver   PAF (paroxysmal atrial fibrillation) ([place])    a. s/p DCCV 6/11; previously on Pradaxa;  b. Event Monitor 2012->No PAF;  c. 09/2011 s/p DCCV ->Xarelto started. d. 03/2014: inappropriate ICD shocks for AF-RVR, started on Tikosyn, Xarelto restarted.   Type II diabetes mellitus ([place])    Unspecified hearing loss    no hearing aid    Past Surgical History:  Procedure Laterality Date   Abdominal US  01/2007   Fatty liver, no gallstones   APPENDECTOMY     ATRIAL TACH ABLATION  09/2015   BI-VENTRICULAR IMPLANTABLE CARDIOVERTER DEFIBRILLATOR [name] 02/14/2013   STJ CRTD implanted by Dr Caryl Comes   BILATERAL KNEE ARTHROSCOPY     BIV ICD GENERATOR CHANGEOUT [name] 06/06/2019   Procedure: BIV ICD GENERATOR CHANGEOUT;  Surgeon: Deboraha Sprang, MD;  Location: [place] [facility];  Service: Cardiovascular;  Laterality: [name];   CARDIOVERSION  09/29/2011   Procedure: CARDIOVERSION;  Surgeon: Wonda Cheng  Nahser, MD;  Location: [place];  Service: Cardiovascular;  Laterality: [name];   COLONOSCOPY WITH PROPOFOL [name] 10/26/2016   Procedure: COLONOSCOPY WITH PROPOFOL;  Surgeon: Irene Shipper, MD;  [facility];  Service: Endoscopy;  Laterality: [name];   DOPPLER ECHOCARDIOGRAPHY  11/2009   Decreased EF of 35-40%   ELECTROPHYSIOLOGIC STUDY [name] 10/14/2015   Procedure: Atrial Fibrillation Ablation;  Surgeon: [name], MD;  Location: [facility];  Service: Cardiovascular;  Laterality: [name];   LEFT HEART CATHETERIZATION WITH CORONARY ANGIOGRAM [name] 08/18/2011   Procedure: LEFT HEART CATHETERIZATION WITH CORONARY ANGIOGRAM;  Surgeon: Peter M Martinique, MD;  [facility];  Service:  Cardiovascular;  Laterality: [name];   NECK SURGERY     plate/fusion   Stress Cardiolite  08/2005   Normal, EF 42%   UPPER GASTROINTESTINAL ENDOSCOPY  08/2006   GERD    Current Outpatient Medications  Medication Sig Dispense Refill   acetaminophen (TYLENOL) 325 MG tablet Take 650 mg by mouth every 6 (six) hours as needed for mild pain or moderate pain.     ALPRAZolam (XANAX) 0.25 MG tablet Take 1 tablet (0.25 mg total) by mouth daily as needed for anxiety. 30 tablet 1   carvedilol (COREG) 25 MG tablet TAKE 2 TABLETS(50 MG) BY MOUTH TWICE DAILY WITH A MEAL 360 tablet 2   cetirizine (ZYRTEC) 10 MG tablet Take 10 mg by mouth daily.      dofetilide (TIKOSYN) 500 MCG capsule TAKE 1 CAPSULE(500 MCG) BY MOUTH TWICE DAILY 180 capsule 3   empagliflozin (JARDIANCE) 10 MG TABS tablet Take 1 tablet (10 mg total) by mouth daily. 90 tablet 3   glipiZIDE (GLUCOTROL XL) 10 MG 24 hr tablet TAKE 1 TABLET(10 MG) BY MOUTH DAILY WITH BREAKFAST 30 tablet 11   glucose blood (ONE TOUCH ULTRA TEST) test strip CHECK GLUCOSE TWICE DAILY AND AS NEEDED, DX. E11.65 (UNCONTROLLED DM) 100 each 5   Magnesium Oxide 400 MG CAPS Take 1 capsule (400 mg total) by mouth daily. 90 capsule 3   metFORMIN (GLUCOPHAGE-XR) 500 MG 24 hr tablet TAKE 3 TABLETS(1500 MG) BY MOUTH DAILY WITH BREAKFAST 90 tablet 5   Multiple Vitamin (MULTIVITAMIN) tablet Take 1 tablet by mouth daily.       Omeprazole-Sodium Bicarbonate (ZEGERID OTC) 20-1100 MG CAPS Take 1 capsule by mouth 2 (two) times daily.      Pitavastatin Calcium (LIVALO) 1 MG TABS Take 1 mg by mouth every other day. Livalo     potassium chloride SA (KLOR-CON) 20 MEQ tablet TAKE 2 TABLETS(40 MEQ) BY MOUTH DAILY     sacubitril-valsartan (ENTRESTO) 97-103 MG Take 1 tablet by mouth 2 (two) times daily. Pt need to keep upcoming appt in June for further refills 180 tablet 2   spironolactone (ALDACTONE) 25 MG tablet TAKE 1 TABLET(25 MG) BY MOUTH DAILY 90 tablet 2   traMADol  (ULTRAM) 50 MG tablet TAKE 1 TABLET(50 MG) BY MOUTH EVERY 12 HOURS AS NEEDED FOR PAIN 60 tablet 2   XARELTO 20 MG TABS tablet TAKE 1 TABLET(20 MG) BY MOUTH DAILY WITH SUPPER 30 tablet 5   No current facility-administered medications for this visit.    Allergies:   Atorvastatin   Social History:  The patient  reports that he has never smoked. He quit smokeless tobacco use about 14 years ago.  His smokeless tobacco use included snuff. He reports that he does not drink alcohol and does not use drugs.   Family History:  The patient's  family history includes Cirrhosis in his maternal uncle; Colon cancer in his paternal grandmother; Colon polyps in his sister; Diabetes in his brother, brother, brother, and mother; Heart attack in his paternal grandfather; Heart failure in his father and mother; Hypertension in his father and mother; Kidney disease in his brother; Leukemia in his sister; Multiple myeloma in his sister; Other in his sister; Prostate cancer in his brother and father; Stroke in his paternal grandmother.   ROS:  Please see the history of present illness.   All other systems are personally reviewed and negative.    Exam:    Vital Signs:  BP 127/71    Pulse 71    Ht '6\' 4"'  (1.93 m)    Wt 289 lb (131.1 kg)    BMI 35.18 kg/m        Labs/Other Tests and Data Reviewed:    Recent Labs: 06/01/2019: Hemoglobin 15.9; Platelets 178 06/21/2019: ALT 19 09/10/2019: BUN 22; Creatinine, Ser 0.76; Magnesium 2.1; NT-Pro BNP 63; Potassium 4.8; Sodium 138   Wt Readings from Last 3 Encounters:  09/27/19 289 lb (131.1 kg)  09/10/19 293 lb (132.9 kg)  09/10/19 293 lb 6.4 oz (133.1 kg)     Other studies personally reviewed:      Last device remote is reviewed from [place] PDF dated 8/21   Not yet available for review    ASSESSMENT & PLAN:   Nonischemic cardiomyopathy  Congestive heart failure- chronic-systolic  PVC RBBB inferior axis  high frequency  Delayed intrinsicoid deflection    Hypertension  Implantable defibrillator-CRT   Sleep disorder breathing  Atrial fibrillation s/p PVI-JA 2017  Inappropriate ICD discharges  The PVCs mostly quiescent on the Mag supplement  Will continue to monitor prior to changing meds, ie adding ranolazine or looking at LV function   If has more, could increase Mg to 400 BID  Encouraged ongoing efforts to exercise       COVID 19 screen The patient denies symptoms of COVID 19 at this time.  The importance of social distancing was discussed today.  Follow-up:  3 w telehealth visit  ( add on) Next remote:  As Scheduled   Current medicines are reviewed at length with the patient today.   The patient does not have concerns regarding his medicines.  The following changes were made today:  none  Labs/ tests ordered today include:   No orders of the defined types were placed in this encounter.   Future tests ( post COVID )     Patient Risk:  after full review of this patients clinical status, I feel that they are at moderate risk at this time.  Today, I have spent 13 minutes with the patient with telehealth technology discussing the above.  Signed, Virl Axe, MD  09/27/2019 2:29 PM     [facility] [address] [place] [address] [phone] (office) [phone] (fax)

## 2019-09-27 NOTE — Patient Instructions (Addendum)
Medication Instructions:  Your physician recommends that you continue on your current medications as directed. Please refer to the Current Medication list given to you today.  Labwork: None ordered.  Testing/Procedures: None ordered.  Follow-Up: Your physician recommends that you schedule a follow-up appointment in:   Dec 2 @ 2pm with Dr. Caryl Comes as a virtual visit.   Any Other Special Instructions Will Be Listed Below (If Applicable).     If you need a refill on your cardiac medications before your next appointment, please call your pharmacy.

## 2019-10-29 ENCOUNTER — Telehealth: Payer: Self-pay | Admitting: Internal Medicine

## 2019-10-29 NOTE — Telephone Encounter (Signed)
Transmission reviewed, pt has had multiple AMS episodes since 10/1 all less than 6 seconds.

## 2019-10-29 NOTE — Telephone Encounter (Signed)
Spoke with pt who reports symptoms as listed below but have since resolved.  Pt sent a device transmission this morning for review.  Pt denies any current symptoms at this time.  Pt advised will have device clinic RN review transmission.  Pt verbalizes understanding and agrees with current plan.

## 2019-10-29 NOTE — Telephone Encounter (Signed)
Do you have an AMS [place]?

## 2019-10-29 NOTE — Telephone Encounter (Signed)
Patient c/o Palpitations:  High priority if patient c/o lightheadedness, shortness of breath, or chest pain  1) How long have you had palpitations/irregular HR/ Afib? Are you having the symptoms now? Since thursday,  No symptoms now  2) Are you currently experiencing lightheadedness, SOB or CP? no  3) Do you have a history of afib (atrial fibrillation) or irregular heart rhythm? yes  4) Have you checked your BP or HR? (document readings if available): Saturday 110/61 HR 44, states he believes it is fine now  5) Are you experiencing any other symptoms? no   Patient states since Thursday his heart has been going out of rhythm. He states Saturday it stayed out of rhythm all day and he almost went to the hospital. He states he is not having any symptoms today. He states it has been the bottom part of his heart that has been fluttering.

## 2019-10-30 ENCOUNTER — Telehealth: Payer: Self-pay | Admitting: Internal Medicine

## 2019-10-30 NOTE — Telephone Encounter (Signed)
   [facility] Pre-operative Risk Assessment    HEARTCARE STAFF: - Please ensure there is not already an duplicate clearance open for this procedure. - Under Visit Info/Reason for Call, type in Other and utilize the format Clearance MM/DD/YY or Clearance TBD. Do not use dashes or single digits. - If request is for dental extraction, please clarify the # of teeth to be extracted.  Request for surgical clearance:  1. What type of surgery is being performed?  1 tooth extraction  2. When is this surgery scheduled? Not noted  3. What type of clearance is required (medical clearance vs. Pharmacy clearance to hold med vs. Both)? pharm  4. Are there any medications that need to be held prior to surgery and how long?advise on blood thinners  5. Practice name and name of physician performing surgery? Dr. Payton Spark   6. What is the office phone number? [phone]   7.   What is the office fax number? [phone]  8.   Anesthesia type (None, local, MAC, general) ? Not noted   Marykay Lex 10/30/2019, 4:51 PM  _________________________________________________________________   (provider comments below)

## 2019-10-30 NOTE — Telephone Encounter (Signed)
Here is an EGM from one of the longer episodes.

## 2019-10-31 NOTE — Telephone Encounter (Signed)
° °  Primary Cardiologist: Jenkins Rouge, MD  Chart reviewed as part of pre-operative protocol coverage. Simple dental extractions are considered low risk procedures per guidelines and generally do not require any specific cardiac clearance. It is also generally accepted that for simple extractions and dental cleanings, there is no need to interrupt blood thinner therapy.   SBE prophylaxis is not required for the patient.  I will route this recommendation to the requesting party via Epic fax function and remove from pre-op pool.  Please call with questions.  Deberah Pelton, NP 10/31/2019, 8:22 AM

## 2019-11-14 ENCOUNTER — Other Ambulatory Visit: Payer: Self-pay | Admitting: Family Medicine

## 2019-11-15 NOTE — Telephone Encounter (Signed)
Electronic refill request. Tramadol Last office visit:   06/21/2019 Last Filled:    60 tablet 2 08/10/2019

## 2019-11-16 ENCOUNTER — Encounter: Payer: Self-pay | Admitting: Family Medicine

## 2019-11-18 NOTE — Telephone Encounter (Signed)
Sent. Thanks.   

## 2019-11-20 NOTE — Telephone Encounter (Signed)
Intercurrent afib On Rivaroxaban  Will follow

## 2019-11-25 ENCOUNTER — Other Ambulatory Visit: Payer: Self-pay | Admitting: Family Medicine

## 2019-11-26 NOTE — Telephone Encounter (Signed)
Refill request Alprazolam Last refill 09/10/19 #30/1 Last office visit 06/21/19

## 2019-11-26 NOTE — Telephone Encounter (Signed)
Sent. Thanks.   

## 2019-12-05 ENCOUNTER — Ambulatory Visit (INDEPENDENT_AMBULATORY_CARE_PROVIDER_SITE_OTHER): Payer: PPO

## 2019-12-05 DIAGNOSIS — I428 Other cardiomyopathies: Secondary | ICD-10-CM | POA: Diagnosis not present

## 2019-12-05 DIAGNOSIS — I5022 Chronic systolic (congestive) heart failure: Secondary | ICD-10-CM

## 2019-12-05 LAB — CUP PACEART REMOTE DEVICE CHECK
Battery Remaining Longevity: 71 mo
Battery Remaining Percentage: 86 %
Battery Voltage: 3.04 V
Brady Statistic AP VP Percent: 1.1 %
Brady Statistic AP VS Percent: 1 %
Brady Statistic AS VP Percent: 97 %
Brady Statistic AS VS Percent: 1 %
Brady Statistic RA Percent Paced: 1 %
Date Time Interrogation Session: 20211110020017
HighPow Impedance: 83 Ohm
HighPow Impedance: 83 Ohm
Implantable Lead Implant Date: 20150121
Implantable Lead Implant Date: 20150121
Implantable Lead Implant Date: 20150121
Implantable Lead Location: 753858
Implantable Lead Location: 753859
Implantable Lead Location: 753860
Implantable Lead Model: 7122
Implantable Pulse Generator Implant Date: 20210512
Lead Channel Impedance Value: 490 Ohm
Lead Channel Impedance Value: 600 Ohm
Lead Channel Impedance Value: 940 Ohm
Lead Channel Pacing Threshold Amplitude: 0.875 V
Lead Channel Pacing Threshold Amplitude: 1.125 V
Lead Channel Pacing Threshold Amplitude: 1.75 V
Lead Channel Pacing Threshold Pulse Width: 0.4 ms
Lead Channel Pacing Threshold Pulse Width: 0.4 ms
Lead Channel Pacing Threshold Pulse Width: 0.5 ms
Lead Channel Sensing Intrinsic Amplitude: 12 mV
Lead Channel Sensing Intrinsic Amplitude: 5 mV
Lead Channel Setting Pacing Amplitude: 1.875
Lead Channel Setting Pacing Amplitude: 2.125
Lead Channel Setting Pacing Amplitude: 2.75 V
Lead Channel Setting Pacing Pulse Width: 0.4 ms
Lead Channel Setting Pacing Pulse Width: 0.5 ms
Lead Channel Setting Sensing Sensitivity: 0.5 mV
Pulse Gen Serial Number: 9890691

## 2019-12-06 NOTE — Progress Notes (Signed)
Remote ICD transmission.   

## 2019-12-13 ENCOUNTER — Other Ambulatory Visit: Payer: Self-pay | Admitting: Cardiovascular Disease

## 2019-12-13 NOTE — Telephone Encounter (Signed)
Xarelto 20mg  refill request received. Pt is 59 years old, weight-131.1kg, Crea-0.76 on 09/10/2019, last seen by Dr. Caryl Comes on 09/27/2019 via Telemedicine, Diagnosis-Afib, CrCl-196.26ml/min; Dose is appropriate based on dosing criteria. Will send in refill to requested pharmacy.

## 2019-12-27 ENCOUNTER — Telehealth: Payer: PPO | Admitting: Internal Medicine

## 2019-12-31 ENCOUNTER — Telehealth: Payer: Self-pay | Admitting: Family Medicine

## 2019-12-31 NOTE — Chronic Care Management (AMB) (Signed)
  Chronic Care Management   Note  12/31/2019 Name: Aaron Thompson MRN: [id number] DOB: [birth year]  Aaron Thompson is a 59 y.o. year old male who is a primary care patient of [name], MD. I reached out to Aaron Thompson by phone today in response to a referral sent by Mr. Aaron Thompson's PCP, [name], MD.   Aaron Thompson was given information about Chronic Care Management services today including:  1. CCM service includes personalized support from designated clinical staff supervised by his physician, including individualized plan of care and coordination with other care providers 2. 24/7 contact phone numbers for assistance for urgent and routine care needs. 3. Service will only be billed when office clinical staff spend 20 minutes or more in a month to coordinate care. 4. Only one practitioner may furnish and bill the service in a calendar month. 5. The patient may stop CCM services at any time (effective at the end of the month) by phone call to the office staff.   Patient agreed to services and verbal consent obtained.   Follow up plan:   [place]

## 2020-01-01 ENCOUNTER — Encounter: Payer: Self-pay | Admitting: Family Medicine

## 2020-01-01 ENCOUNTER — Other Ambulatory Visit: Payer: Self-pay | Admitting: Cardiovascular Disease

## 2020-01-02 MED ORDER — ENTRESTO 97-103 MG PO TABS: 1.0000 | ORAL_TABLET | Freq: Two times a day (BID) | ORAL | 2 refills | Status: DC

## 2020-01-21 ENCOUNTER — Other Ambulatory Visit: Payer: Self-pay | Admitting: Internal Medicine

## 2020-02-04 DIAGNOSIS — Z95810 Presence of automatic (implantable) cardiac defibrillator: Secondary | ICD-10-CM | POA: Insufficient documentation

## 2020-02-06 NOTE — Progress Notes (Signed)
Electrophysiology TeleHealth Note   Due to national recommendations of social distancing due to COVID 19, an audio/video telehealth visit is felt to be most appropriate for this patient at this time.  See MyChart message from today for the patient's consent to telehealth for [facility].   Date:  02/07/2020   ID:  Aaron Thompson, DOB 05/13/1960, MRN [id number]  Location: patient's home  Provider location: [address] [place]  Evaluation Performed: Follow-up visit  PCP:  [name], MD  Cardiologist:     Electrophysiologist:  SK   Chief Complaint:   PVCs  History of Present Illness:    Aaron Thompson is a 60 y.o. male  who presents via audio/video conferencing for a telehealth visit today.  Since last being seen in our clinic Chose to stop his anticoagulation and to return to work. March 2016 he had ICD shocks associated with very rapid atrial fibrillation.  Rx w dofetilide therapy  And Rivaroxaban; underwent catheter ablation of his atrial fibrillation (JA) 9/17  And most recently has had symptomatic PVCs   Doing pretty well, played golf last week but sob with carrying groceries and mild incline climbing   No edema     DATE TEST EF   9/17 Echo 40 %   11/18 Echo 20-25%   1/20 Echo 25-30%       Date Cr K Hgb  3/21 0.69 4.7 15.7  5/21 0.74 4.7 15.9  8/21 0.76 4.8    Thromboembolic risk factors ( HTN-1, CHF-1 ) for a CHADSVASc Score of >=2  The patient denies symptoms of fevers, chills, cough, or new SOB worrisome for COVID 19.    Past Medical History:  Diagnosis Date  . AICD (automatic cardioverter/defibrillator) present   . Anxiety state, unspecified   . Calculus of kidney 1981   "passed it on my own"  . Chronic systolic CHF (congestive heart failure) ([place])   . Dermatophytosis of the body   . DJD (degenerative joint disease)   . Esophageal reflux   . Hx of colonic polyps   . Hypertension   . LBBB (left bundle  branch block)    intermittent  . Non-ischemic cardiomyopathy ([place])    a. cath 2013: minor nonobstructive CAD.  [name] OSA (obstructive sleep apnea)    he can't tolerate CPAP.   [name] Osteoarthrosis, unspecified whether generalized or localized, hand   . Other chronic nonalcoholic liver disease    fatty liver  . PAF (paroxysmal atrial fibrillation) ([place])    a. s/p DCCV 6/11; previously on Pradaxa;  b. Event Monitor 2012->No PAF;  c. 09/2011 s/p DCCV ->Xarelto started. d. 03/2014: inappropriate ICD shocks for AF-RVR, started on Tikosyn, Xarelto restarted.  . Type II diabetes mellitus ([place])   . Unspecified hearing loss    no hearing aid    Past Surgical History:  Procedure Laterality Date  . Abdominal US  01/2007   Fatty liver, no gallstones  . APPENDECTOMY    . ATRIAL TACH ABLATION  09/2015  . BI-VENTRICULAR IMPLANTABLE CARDIOVERTER DEFIBRILLATOR [name] 02/14/2013   STJ CRTD implanted by Dr Caryl Comes  . BILATERAL KNEE ARTHROSCOPY    . BIV ICD GENERATOR CHANGEOUT [name] 06/06/2019   Procedure: BIV ICD GENERATOR CHANGEOUT;  Surgeon: Deboraha Sprang, MD;  Location: [facility];  Service: Cardiovascular;  Laterality: [name];  . CARDIOVERSION  09/29/2011   Procedure: CARDIOVERSION;  Surgeon: Thayer Headings, MD;  Location: [facility];  Service: Cardiovascular;  Laterality: [name];  . COLONOSCOPY WITH PROPOFOL [name] 10/26/2016   Procedure: COLONOSCOPY WITH PROPOFOL;  Surgeon: Irene Shipper, MD;  Location[facility];  Service: Endoscopy;  Laterality: [name];  . DOPPLER ECHOCARDIOGRAPHY  11/2009   Decreased EF of 35-40%  . ELECTROPHYSIOLOGIC STUDY [name] 10/14/2015   Procedure: Atrial Fibrillation Ablation;  Surgeon: [name], MD;  Location: [facility];  Service: Cardiovascular;  Laterality: [name];  . LEFT HEART CATHETERIZATION WITH CORONARY ANGIOGRAM [name] 08/18/2011   Procedure: LEFT HEART CATHETERIZATION WITH CORONARY ANGIOGRAM;  Surgeon: Peter M Martinique, MD;  Location: [facility] CATH [facility];  Service: Cardiovascular;   Laterality: [name];  . NECK SURGERY     plate/fusion  . Stress Cardiolite  08/2005   Normal, EF 42%  . UPPER GASTROINTESTINAL ENDOSCOPY  08/2006   GERD    Current Outpatient Medications  Medication Sig Dispense Refill  . acetaminophen (TYLENOL) 325 MG tablet Take 650 mg by mouth every 6 (six) hours as needed for mild pain or moderate pain.    [name] ALPRAZolam (XANAX) 0.25 MG tablet TAKE 1 TABLET BY MOUTH AS NEEDED FOR ANXIETY 30 tablet 1  . carvedilol (COREG) 25 MG tablet TAKE 2 TABLETS(50 MG) BY MOUTH TWICE DAILY WITH A MEAL 360 tablet 2  . cetirizine (ZYRTEC) 10 MG tablet Take 10 mg by mouth daily.    [name] dofetilide (TIKOSYN) 500 MCG capsule TAKE 1 CAPSULE(500 MCG) BY MOUTH TWICE DAILY 180 capsule 3  . empagliflozin (JARDIANCE) 10 MG TABS tablet Take 1 tablet (10 mg total) by mouth daily. 90 tablet 3  . glipiZIDE (GLUCOTROL XL) 10 MG 24 hr tablet TAKE 1 TABLET(10 MG) BY MOUTH DAILY WITH BREAKFAST 30 tablet 11  . glucose blood (ONE TOUCH ULTRA TEST) test strip CHECK GLUCOSE TWICE DAILY AND AS NEEDED, DX. E11.65 (UNCONTROLLED DM) 100 each 5  . Magnesium Oxide 400 MG CAPS Take 1 capsule (400 mg total) by mouth daily. 90 capsule 3  . metFORMIN (GLUCOPHAGE-XR) 500 MG 24 hr tablet TAKE 3 TABLETS(1500 MG) BY MOUTH DAILY WITH BREAKFAST 90 tablet 5  . Multiple Vitamin (MULTIVITAMIN) tablet Take 1 tablet by mouth daily.    [name] Bicarbonate (ZEGERID) 20-1100 MG CAPS capsule Take 1 capsule by mouth 2 (two) times daily.    . Pitavastatin Calcium (LIVALO) 1 MG TABS Take 1 mg by mouth every other day. Livalo    . potassium chloride SA (KLOR-CON) 20 MEQ tablet TAKE 2 TABLETS(40 MEQ) BY MOUTH DAILY    . sacubitril-valsartan (ENTRESTO) 97-103 MG Take 1 tablet by mouth 2 (two) times daily. Pt need to keep upcoming appt in June for further refills 180 tablet 2  . spironolactone (ALDACTONE) 25 MG tablet TAKE 1 TABLET(25 MG) BY MOUTH DAILY 90 tablet 2  . traMADol (ULTRAM) 50 MG tablet TAKE 1 TABLET(50  MG) BY MOUTH EVERY 12 HOURS AS NEEDED FOR PAIN 60 tablet 2  . XARELTO 20 MG TABS tablet TAKE 1 TABLET(20 MG) BY MOUTH DAILY WITH SUPPER 30 tablet 5   No current facility-administered medications for this visit.    Allergies:   Atorvastatin   Social History:  The patient  reports that he has never smoked. He quit smokeless tobacco use about 15 years ago.  His smokeless tobacco use included snuff. He reports that he does not drink alcohol and does not use drugs.   Family History:  The patient's   family history includes Cirrhosis in his maternal uncle; Colon cancer in his paternal grandmother; Colon polyps in  his sister; Diabetes in his brother, brother, brother, and mother; Heart attack in his paternal grandfather; Heart failure in his father and mother; Hypertension in his father and mother; Kidney disease in his brother; Leukemia in his sister; Multiple myeloma in his sister; Other in his sister; Prostate cancer in his brother and father; Stroke in his paternal grandmother.   ROS:  Please see the history of present illness.   All other systems are personally reviewed and negative.    Exam:    Vital Signs:  BP 118/82 (BP Location: Left Arm, Patient Position: Sitting, Cuff Size: Normal)   Pulse 81   Wt 286 lb (129.7 kg)   BMI 34.81 kg/m        Labs/Other Tests and Data Reviewed:    Recent Labs: 06/01/2019: Hemoglobin 15.9; Platelets 178 06/21/2019: ALT 19 09/10/2019: BUN 22; Creatinine, Ser 0.76; Magnesium 2.1; NT-Pro BNP 63; Potassium 4.8; Sodium 138   Wt Readings from Last 3 Encounters:  02/07/20 286 lb (129.7 kg)  09/27/19 289 lb (131.1 kg)  09/10/19 293 lb (132.9 kg)     Other studies personally reviewed:      Last device remote is reviewed from [place] PDF dated 11/21 >> infrequent episodes of atrial fib < 3 hrs   ASSESSMENT & PLAN:   Nonischemic cardiomyopathy  Congestive heart failure- chronic-systolic  PVC RBBB inferior axis  high frequency  Delayed intrinsicoid  deflection   Hypertension  Implantable defibrillator-CRT   Sleep disorder breathing  Atrial fibrillation s/p PVI-JA 2017  Inappropriate ICD discharges   No intercurrent Ventricular tachycardia  .modest CHF  Limiting dyspnea but without volume overload   BP well controlled         COVID 19 screen The patient denies symptoms of COVID 19 at this time.  The importance of social distancing was discussed today.   Follow-up:  80mand arrange followup with DR DB  Next remote: As Scheduled   Current medicines are reviewed at length with the patient today.   The patient does not have concerns regarding his medicines.  The following changes were made today:  none  Labs/ tests ordered today include:  No orders of the defined types were placed in this encounter.   Future tests ( post COVID )    Patient Risk:  after full review of this patients clinical status, I feel that they are at moderate risk at this time.  Today, I have spent 14* minutes with the patient with telehealth technology discussing the above.  Signed, SVirl Axe MD  02/07/2020 2:33 PM     [place]S[place] [phone]) [phone])    Signed, SVirl Axe MD  02/07/2020 2:33 PM     [facility]SSummerhavenGreensboro [place] [phone]) [phone])

## 2020-02-07 ENCOUNTER — Telehealth (INDEPENDENT_AMBULATORY_CARE_PROVIDER_SITE_OTHER): Payer: HMO | Admitting: Internal Medicine

## 2020-02-07 ENCOUNTER — Encounter: Payer: Self-pay | Admitting: Internal Medicine

## 2020-02-07 ENCOUNTER — Other Ambulatory Visit: Payer: Self-pay

## 2020-02-07 VITALS — BP 118/82 | HR 81 | Wt 286.0 lb

## 2020-02-07 DIAGNOSIS — Z95810 Presence of automatic (implantable) cardiac defibrillator: Secondary | ICD-10-CM

## 2020-02-07 DIAGNOSIS — I428 Other cardiomyopathies: Secondary | ICD-10-CM

## 2020-02-07 DIAGNOSIS — I4891 Unspecified atrial fibrillation: Secondary | ICD-10-CM | POA: Diagnosis not present

## 2020-02-07 DIAGNOSIS — I5022 Chronic systolic (congestive) heart failure: Secondary | ICD-10-CM

## 2020-02-11 ENCOUNTER — Telehealth: Payer: Self-pay

## 2020-02-11 NOTE — Chronic Care Management (AMB) (Signed)
Chronic Care Management Pharmacy Assistant   Name: Aaron Thompson  MRN: [id number] DOB: January 16, 1961  Reason for Encounter: Initial questions for 02/12/20 appointment    PCP : [name], MD  Allergies:   Allergies  Allergen Reactions  . Atorvastatin     Muscle pain     Medications: Outpatient Encounter Medications as of 02/11/2020  Medication Sig  . acetaminophen (TYLENOL) 325 MG tablet Take 650 mg by mouth every 6 (six) hours as needed for mild pain or moderate pain.  [name] ALPRAZolam (XANAX) 0.25 MG tablet TAKE 1 TABLET BY MOUTH AS NEEDED FOR ANXIETY  . carvedilol (COREG) 25 MG tablet TAKE 2 TABLETS(50 MG) BY MOUTH TWICE DAILY WITH A MEAL  . cetirizine (ZYRTEC) 10 MG tablet Take 10 mg by mouth daily.  [name] dofetilide (TIKOSYN) 500 MCG capsule TAKE 1 CAPSULE(500 MCG) BY MOUTH TWICE DAILY  . empagliflozin (JARDIANCE) 10 MG TABS tablet Take 1 tablet (10 mg total) by mouth daily.  [name] glipiZIDE (GLUCOTROL XL) 10 MG 24 hr tablet TAKE 1 TABLET(10 MG) BY MOUTH DAILY WITH BREAKFAST  . glucose blood (ONE TOUCH ULTRA TEST) test strip CHECK GLUCOSE TWICE DAILY AND AS NEEDED, DX. E11.65 (UNCONTROLLED DM)  . Magnesium Oxide 400 MG CAPS Take 1 capsule (400 mg total) by mouth daily.  . metFORMIN (GLUCOPHAGE-XR) 500 MG 24 hr tablet TAKE 3 TABLETS(1500 MG) BY MOUTH DAILY WITH BREAKFAST  . Multiple Vitamin (MULTIVITAMIN) tablet Take 1 tablet by mouth daily.  [name] Bicarbonate (ZEGERID) 20-1100 MG CAPS capsule Take 1 capsule by mouth 2 (two) times daily.  . Pitavastatin Calcium (LIVALO) 1 MG TABS Take 1 mg by mouth every other day. Livalo  . potassium chloride SA (KLOR-CON) 20 MEQ tablet TAKE 2 TABLETS(40 MEQ) BY MOUTH DAILY  . sacubitril-valsartan (ENTRESTO) 97-103 MG Take 1 tablet by mouth 2 (two) times daily. Pt need to keep upcoming appt in June for further refills  . spironolactone (ALDACTONE) 25 MG tablet TAKE 1 TABLET(25 MG) BY MOUTH DAILY  . traMADol (ULTRAM) 50 MG tablet TAKE  1 TABLET(50 MG) BY MOUTH EVERY 12 HOURS AS NEEDED FOR PAIN  . XARELTO 20 MG TABS tablet TAKE 1 TABLET(20 MG) BY MOUTH DAILY WITH SUPPER   No facility-administered encounter medications on file as of 02/11/2020.    Current Diagnosis: Patient Active Problem List   Diagnosis Date Noted  . ICD (implantable cardioverter-defibrillator) in place 02/04/2020  . Shoulder pain, bilateral 12/01/2018  . Low back pain 09/07/2016  . Family history of prostate cancer 06/23/2016  . Advance care planning 06/23/2016  . Colon polyps 06/23/2016  . Healthcare maintenance 03/08/2016  . A-fib ([place]) 10/14/2015  . Inappropriate shocks from ICD (implantable cardioverter-defibrillator) 03/17/2015  . Obesity (BMI 30-39.9) 12/05/2014  . ICD (implantable cardioverter-defibrillator) discharge 04/19/2014  . Chronic systolic heart failure ([place]) 02/14/2013  . Hyperlipidemia 02/05/2013  . Uncontrolled type 2 diabetes mellitus with circulatory disorder, without long-term current use of insulin ([place]) 09/30/2011  . Nonischemic cardiomyopathy ([place]) 08/18/2011  . Chest pain on exertion 08/17/2011  . Other restrictive cardiomyopathy ([place]) 07/16/2009  . OSTEOARTHROSIS UNSPEC WHETHER GEN/LOCALIZED HAND 08/16/2008  . Essential hypertension 05/11/2007  . History of renal calculi 05/11/2007  . Fatty liver 03/20/2007  . ANXIETY 10/04/2006  . HEARING IMPAIRMENT 10/04/2006  . GERD 09/20/2006   Have you seen any other providers since your last visit? Yes   02/07/20- Televisit- Virl Axe, MD- Cardiology  12/05/19- Virl Axe, MD- Cardiology  09/27/19- Virl Axe, MD-  Cardiology  09/10/19- Virl Axe, MD- Cardiology  09/10/19- Jenkins Rouge, MD- Cardiology  09/05/19- Virl Axe, MD- Cardiology  Any changes in your medications or health? Yes  09/10/19 Dr. Caryl Comes added magnesium oxide 400 mg daily.   Any side effects from any medications? No  Do you have an symptoms or problems not managed by your medications? No  Any  concerns about your health right now? States he gets short of breath when carrying things. Able to improve with rest.   Has your provider asked that you check blood pressure, blood sugar, or follow special diet at home? Supposed to check blood pressure but does not do so regularly. Tries to avoid carbs. Checks blood sugar daily.   Do you get any type of exercise on a regular basis? States he is fairly active. He plays golf and tries to walk when the weather is nice. Uses a 20 pound weight to curl while watching TV.  Can you think of a goal you would like to reach for your health? Wants to be able to walk 1/2 mile.  Do you have any problems getting your medications? He is not sure if his medications will be expensive this year. He will wait to pick them up from the pharmacy and let us know. States he is on the heart and diabetic plan.   Is there anything that you would like to discuss during the appointment? Not at this time.   Follow-Up:  Pharmacist Review   [name], CPP notified  [name], [facility] Pharmacy Assistant [phone]

## 2020-02-12 ENCOUNTER — Ambulatory Visit: Payer: HMO

## 2020-02-12 ENCOUNTER — Other Ambulatory Visit: Payer: Self-pay

## 2020-02-12 DIAGNOSIS — IMO0002 Reserved for concepts with insufficient information to code with codable children: Secondary | ICD-10-CM

## 2020-02-12 DIAGNOSIS — E7800 Pure hypercholesterolemia, unspecified: Secondary | ICD-10-CM

## 2020-02-12 DIAGNOSIS — E1165 Type 2 diabetes mellitus with hyperglycemia: Secondary | ICD-10-CM

## 2020-02-12 NOTE — Chronic Care Management (AMB) (Signed)
 Chronic Care Management Pharmacy  Name: Aaron Thompson  MRN: [id number] DOB: 01/04/1961   Chief Complaint/ HPI  Aaron Thompson,  59 y.o. , male presents for their Initial CCM visit with the clinical pharmacist via telephone.  PCP : Duncan, Graham S, MD  Their chronic conditions include: HTN, DM, AFIB, GERD, fatty liver, OA, anxiety, HLD  CCM Consent 12/31/19  Patient denies any problems with medications. Reports his exercise tolerance has decreased recently. Discussed with Dr. Klein and working on improvement through exercise. Still able to play golf a couple days a week. Out of breath if carrying something while walking. Reports diagnosed with CHF around 2012-13. Denies any changes in heart medications.   Office Visits:  06/21/19: PCP - History of, with A1c previously above 7.  Still on Metformin and Jardiance.  Blood sugars usually averaging around 150 recently.  See notes on labs.  No change in meds at this point. Continue current medication, Recheck A1c in 4 months.  Consult Visit:  02/07/20: Cardiology (Klein) - note incomplete  Medications: Outpatient Encounter Medications as of 02/12/2020  Medication Sig  . acetaminophen (TYLENOL) 325 MG tablet Take 650 mg by mouth every 6 (six) hours as needed for mild pain or moderate pain.  . ALPRAZolam (XANAX) 0.25 MG tablet TAKE 1 TABLET BY MOUTH AS NEEDED FOR ANXIETY  . carvedilol (COREG) 25 MG tablet TAKE 2 TABLETS(50 MG) BY MOUTH TWICE DAILY WITH A MEAL  . cetirizine (ZYRTEC) 10 MG tablet Take 10 mg by mouth daily.  . dofetilide (TIKOSYN) 500 MCG capsule TAKE 1 CAPSULE(500 MCG) BY MOUTH TWICE DAILY  . empagliflozin (JARDIANCE) 10 MG TABS tablet Take 1 tablet (10 mg total) by mouth daily.  . glipiZIDE (GLUCOTROL XL) 10 MG 24 hr tablet TAKE 1 TABLET(10 MG) BY MOUTH DAILY WITH BREAKFAST  . glucose blood (ONE TOUCH ULTRA TEST) test strip CHECK GLUCOSE TWICE DAILY AND AS NEEDED, DX. E11.65 (UNCONTROLLED DM)  . Magnesium Oxide 400 MG CAPS  Take 1 capsule (400 mg total) by mouth daily.  . metFORMIN (GLUCOPHAGE-XR) 500 MG 24 hr tablet TAKE 3 TABLETS(1500 MG) BY MOUTH DAILY WITH BREAKFAST  . Multiple Vitamin (MULTIVITAMIN) tablet Take 1 tablet by mouth daily.  . Omeprazole-Sodium Bicarbonate (ZEGERID) 20-1100 MG CAPS capsule Take 1 capsule by mouth 2 (two) times daily.  . Pitavastatin Calcium (LIVALO) 1 MG TABS Take 1 mg by mouth every other day. Livalo  . potassium chloride SA (KLOR-CON) 20 MEQ tablet TAKE 2 TABLETS(40 MEQ) BY MOUTH DAILY  . sacubitril-valsartan (ENTRESTO) 97-103 MG Take 1 tablet by mouth 2 (two) times daily. Pt need to keep upcoming appt in June for further refills  . spironolactone (ALDACTONE) 25 MG tablet TAKE 1 TABLET(25 MG) BY MOUTH DAILY  . traMADol (ULTRAM) 50 MG tablet TAKE 1 TABLET(50 MG) BY MOUTH EVERY 12 HOURS AS NEEDED FOR PAIN  . XARELTO 20 MG TABS tablet TAKE 1 TABLET(20 MG) BY MOUTH DAILY WITH SUPPER   No facility-administered encounter medications on file as of 02/12/2020.   SDOH Interventions   Flowsheet Row Most Recent Value  SDOH Interventions   Financial Strain Interventions Intervention Not Indicated  [Medications affordable]     Goals    . Patient Stated     05/15/2019, I will maintain and continue medications as prescribed.    . Pharmacy Care Plan     CARE PLAN ENTRY (see longitudinal plan of care for additional care plan information)  Current Barriers:  . Chronic Disease Management   support, education, and care coordination needs related to Hyperlipidemia and Diabetes   Hyperlipidemia Lab Results  Component Value Date/Time   LDLCALC 120 (H) 06/21/2019 09:45 AM   LDLDIRECT 97.0 06/16/2016 07:55 AM   . Pharmacist Clinical Goal(s): o Over the next 3 months, patient will work with PharmD and providers to achieve LDL goal < 70 . Current regimen:  o Livalo 1 mg - 1 tablet daily  . Interventions: o Recommend updating lipid panel to assess control at next office visit and then  considering dose adjustments if needed . Patient self care activities - Over the next 3 months, patient will: o Continue medications as prescribed o Slowly increase exercise with goal of 10 minutes, 5 days per week o Incorporate a healthy diet high in vegetables, fruits and whole grains with low-fat dairy products, chicken, fish, legumes, non-tropical vegetable oils and nuts. Limit intake of sweets, sugar-sweetened beverages and red meats.  Diabetes Lab Results  Component Value Date/Time   HGBA1C 7.5 (H) 06/21/2019 09:45 AM   HGBA1C 7.6 (H) 04/06/2019 11:52 AM   . Pharmacist Clinical Goal(s): o Over the next 3 months, patient will work with PharmD and providers to achieve A1c goal <7% . Current regimen:   Metformin ER 500 mg - 3 tablets with breakfast  Jardiance 10 mg - 1 tablet daily   Glipizide ER 10 mg - 1 tablet daily  . Interventions: o Recommend increasing Jardiance to 25 mg daily - consult PCP . Patient self care activities - Over the next 3 months, patient will: o Check blood sugar once daily, document, and provide at future appointments o Continue current medications until further contact from clinic o Contact provider with any episodes of hypoglycemia  Initial goal documentation       Current Diagnosis/Assessment: Hypertension   CMP Latest Ref Rng & Units 09/10/2019 06/21/2019 06/01/2019  Glucose 65 - 99 mg/dL 183(H) 193(H) 178(H)  BUN 6 - 24 mg/dL _0 Creatinine 0.76 - 1.27 mg/dL 0.76 0.74 0.75(L)  Sodium 134 - 144 mmol/L 138 136 138  Potassium 3.5 - 5.2 mmol/L 4.8 4.7 4.7  Chloride 96 - 106 mmol/L 102 103 101  CO2 20 - 29 mmol/L _1 Calcium 8.7 - 10.2 mg/dL 9.1 9.1 8.8  Total Protein 6.0 - 8.3 g/dL - 7.0 -  Total Bilirubin 0.2 - 1.2 mg/dL - 0.6 -  Alkaline Phos 39 - 117 U/L - 50 -  AST 0 - 37 U/L - 19 -  ALT 0 - 53 U/L - 19 -   Office blood pressures are: BP Readings from Last 3 Encounters:  02/07/20 118/82  09/27/19 127/71  09/10/19 120/60    Patient is currently controlled on the following medications:   Carvedilol 25 mg - 2 tablet BID  Spironolactone 25 mg - 1 tablet daily   Potassium Chloride 20 mEq ER - 2 tablets daily   02/12/20: Pt has HFrEF. Reports some dizziness upon standing when playing golf. Checked his BP once he got home and it was a little low. Usually able to tolerate these symptoms. Checks BP about once a week - usually around 120/80. Checked BP at home on 02/07/20 prior to cardiology appointment - 132/88, rechecked 118/82. Stable, controlled.   Plan: Continue current medications  Hyperlipidemia   LDL goal < 70   Last lipids Lab Results  Component Value Date   CHOL 190 06/21/2019   HDL 41.60 06/21/2019   LDLCALC 120 (H) 06/21/2019  LDLDIRECT 97.0 06/16/2016   TRIG 141.0 06/21/2019   CHOLHDL 5 06/21/2019   Hepatic Function Latest Ref Rng & Units 06/21/2019 04/06/2019 05/19/2018  Total Protein 6.0 - 8.3 g/dL 7.0 6.6 6.9  Albumin 3.5 - 5.2 g/dL 4.5 4.2 4.3  AST 0 - 37 U/L _0 ALT 0 - 53 U/L _1 Alk Phosphatase 39 - 117 U/L 50 52 50  Total Bilirubin 0.2 - 1.2 mg/dL 0.6 0.6 0.5  Bilirubin, Direct 0.0 - 0.3 mg/dL - - -    The 10-year ASCVD risk score [name], et al., 2013) is: 15%   Values used to calculate the score:     Age: 46 years     Sex: Male     Is Non-Hispanic African American: No     Diabetic: Yes     Tobacco smoker: No     Systolic Blood Pressure: 474 mmHg     Is BP treated: Yes     HDL Cholesterol: 41.6 mg/dL     Total Cholesterol: 190 mg/dL   Patient has failed these meds in past: none  Patient is currently uncontrolled on the following medications:   Livalo 1 mg - 1 tablet daily   02/12/19: Patient reports history of muscle aches with prior statins. His brother did well on Livalo so he requested to try it. Doing well on Livalo 1 mg so far. Reports he started Spring 2021 taking every other day, then increased to daily. Unclear exact timeline but appears pt may  have increased dose after last lipid panel. He is open to Livalo dose adjustments if needed. Recommend updating lipid panel to assess control.  Plan: Continue current medications; Recommend updating lipid panel to assess control at next office visit.   AFIB   Lab Results  Component Value Date   CREATININE 0.76 09/10/2019   BUN 22 09/10/2019   GFR 108.76 06/21/2019   GFRNONAA 101 09/10/2019   GFRAA 117 09/10/2019   NA 138 09/10/2019   K 4.8 09/10/2019   CALCIUM 9.1 09/10/2019   CO2 22 09/10/2019   CBC Latest Ref Rng & Units 06/01/2019 04/06/2019 12/14/2016  WBC 3.4 - 10.8 x10E3/uL 6.1 7.1 8.2  Hemoglobin 13.0 - 17.7 g/dL 15.9 15.7 15.3  Hematocrit 37.5 - 51.0 % 47.0 46.7 46.1  Platelets 150 - 450 x10E3/uL 178 180.0 215   Patient is currently rate and rhythm controlled.  Patient has failed these meds in past: none reported Patient is currently on the following medications:   Xarelto 20 mg - 1 tablet daily  Dofetilide 500 mcg - 1 tablet BID   Carvedilol 25 mg - 2 tablet BID  Magnesium Oxide 500 mg - 1 capsule daily   02/12/20:  Patient confirms adherence of the above medications, denies any problems. Sees cardiology regularly.   Plan: Continue current medications  Diabetes   Lab Results  Component Value Date   CREATININE 0.76 09/10/2019   BUN 22 09/10/2019   GFR 108.76 06/21/2019   GFRNONAA 101 09/10/2019   GFRAA 117 09/10/2019   NA 138 09/10/2019   K 4.8 09/10/2019   CALCIUM 9.1 09/10/2019   CO2 22 09/10/2019    Recent Relevant Labs: Lab Results  Component Value Date/Time   HGBA1C 7.5 (H) 06/21/2019 09:45 AM   HGBA1C 7.6 (H) 04/06/2019 11:52 AM   MICROALBUR 1.7 10/09/2010 11:36 AM    A1c goal < 7%  Patient has failed these meds in past: none  Patient is  currently uncontrolled on the following medications:   Metformin ER 500 mg - 3 tablets with breakfast  Jardiance 10 mg - 1 tablet daily   Glipizide ER 10 mg - 1 tablet daily   Eye exam:  Lab Results   Component Value Date/Time   HMDIABEYEEXA No Retinopathy 12/14/2017 12:00 AM    Foot exam: May 2021 normal. No tingling or neuropathy. Checks feet daily.  02/12/20: Diet - Reports he has gained 10 lbs in the past month. Eating a lot of junk food. Denies hypoglycemia but reports symptoms of dizziness when BG < 100. Reports BG is usually 130-150, up to 180 fasting in the morning. Reports highs in the morning when he eats after 8 PM. Confirmed glipizide 10 mg every morning, metformin ER 500 mg 3 in the morning, and Jardiance 10 mg.  Plan: Continue current medications; Consult PCP to consider increasing Jardiance to 25 mg. Check BG daily in the morning.   Heart Failure   Type: Systolic Last ejection fraction: 20-25% 02/02/2018  Patient has failed these meds in past: none Patient is currently uncontrolled on the following medications:   Entresto 97-103 mg - 1 tablet BID   Carvedilol 25 mg - 2 tablet BID  Spironolactone 25 mg - 1 tablet daily   Potassium Chloride 20 mEq ER - 1 QID  Jardiance 10 mg - 1 tablet daily  02/12/20: Reports next app with cardio in March and plans to discuss titration of spironolactone at that time. Recommend increasing Jardiance for both DM and heart failure benefits.   Plan: Continue current medications; Recommend increasing Jardiance for both DM and heart failure benefits.   Osteoarthritis   Patient reports pain is all over, non-specific Patient has failed these meds in past: none reported Patient is currently controlled on the following medications:   Tramadol 50 mg - 1 tablet BID  Tylenol 325 mg - 1 tablet daily   02/12/20: Reports taking tramadol twice daily most days. Takes a Tylenol with tramadol each morning.   Plan: Continue current medications  Anxiety   Patient has failed these meds in past: none reported Patient is currently controlled on the following medications:   Xanax 0.25 mg - 1 tablet daily PRN  02/12/20: Patient reports anxiety  related to heart condition and needed something to take edge off. Reports using as needed sparingly. Patient denies any drowsiness or side effects with Xanax. Using about once a week. Does feel he has symptoms similar to panic attacks at times.  Plan: Continue current medications  Medication Management   OTCs:  Zegerid (omeprazole-sodium bicarbonate) 20-1100 mg cap - 1 BID (takes morning every day, nighttime dose PRN)  Multivitamin - 1 daily   Claritin 10 mg - 1 daily every morning   Patient's preferred pharmacy is:  [id number] - [place], [place] - [address] AT [address] [place] [address] Phone: [phone] Fax: [phone]  Uses pill box? Yes Pt endorses 100% compliance - may be an hour late but rarely misses   We discussed: Discussed benefits of medication synchronization, packaging and delivery as well as enhanced pharmacist oversight with [employer].  Cost - Switched to diabetes and heart plan this year. He is hoping medications will be lower cost with new plan  Plan  Continue current medication management strategy Patient would like to discuss [employer] coordination with his wife.   Follow up: 3 month phone visit (05/15/20 8:30 AM)  Michelle   Andree Elk, PharmD Clinical Pharmacist [facility] [phone]

## 2020-02-19 ENCOUNTER — Other Ambulatory Visit: Payer: Self-pay | Admitting: Family Medicine

## 2020-02-20 ENCOUNTER — Telehealth: Payer: Self-pay

## 2020-02-20 ENCOUNTER — Other Ambulatory Visit: Payer: Self-pay | Admitting: Family Medicine

## 2020-02-20 NOTE — Patient Instructions (Addendum)
Dear Aaron Thompson,  It was a pleasure meeting you during our initial appointment on February 12, 2020. Below is a summary of the goals we discussed and components of chronic care management. Please contact me anytime with questions or concerns.   Visit Information  Goals Addressed            This Visit's Progress   . Pharmacy Care Plan       CARE PLAN ENTRY (see longitudinal plan of care for additional care plan information)  Current Barriers:  . Chronic Disease Management support, education, and care coordination needs related to Hyperlipidemia and Diabetes   Hyperlipidemia Lab Results  Component Value Date/Time   LDLCALC 120 (H) 06/21/2019 09:45 AM   LDLDIRECT 97.0 06/16/2016 07:55 AM   . Pharmacist Clinical Goal(s): o Over the next 3 months, patient will work with PharmD and providers to achieve LDL goal < 70 . Current regimen:  o Livalo 1 mg - 1 tablet daily  . Interventions: o Recommend updating lipid panel to assess control at next office visit and then considering dose adjustments if needed . Patient self care activities - Over the next 3 months, patient will: o Continue medications as prescribed o Slowly increase exercise with goal of 10 minutes, 5 days per week o Incorporate a healthy diet high in vegetables, fruits and whole grains with low-fat dairy products, chicken, fish, legumes, non-tropical vegetable oils and nuts. Limit intake of sweets, sugar-sweetened beverages and red meats.  Diabetes Lab Results  Component Value Date/Time   HGBA1C 7.5 (H) 06/21/2019 09:45 AM   HGBA1C 7.6 (H) 04/06/2019 11:52 AM   . Pharmacist Clinical Goal(s): o Over the next 3 months, patient will work with PharmD and providers to achieve A1c goal <7% . Current regimen:   Metformin ER 500 mg - 3 tablets with breakfast  Jardiance 10 mg - 1 tablet daily   Glipizide ER 10 mg - 1 tablet daily  . Interventions: o Recommend increasing Jardiance to 25 mg daily - consult  PCP . Patient self care activities - Over the next 3 months, patient will: o Check blood sugar once daily, document, and provide at future appointments o Continue current medications until further contact from clinic o Contact provider with any episodes of hypoglycemia  Initial goal documentation        Aaron Thompson was given information about Chronic Care Management services today including:  1. CCM service includes personalized support from designated clinical staff supervised by his physician, including individualized plan of care and coordination with other care providers 2. 24/7 contact phone numbers for assistance for urgent and routine care needs. 3. Standard insurance, coinsurance, copays and deductibles apply for chronic care management only during months in which we provide at least 20 minutes of these services. Most insurances cover these services at 100%, however patients may be responsible for any copay, coinsurance and/or deductible if applicable. This service may help you avoid the need for more expensive face-to-face services. 4. Only one practitioner may furnish and bill the service in a calendar month. 5. The patient may stop CCM services at any time (effective at the end of the month) by phone call to the office staff.  Patient agreed to services and verbal consent obtained.   The patient verbalized understanding of instructions, educational materials, and care plan provided today and agreed to receive a mailed copy of patient instructions, educational materials, and care plan.  Telephone follow up appointment with pharmacy team member scheduled for: (  05/15/20 8:30 AM PHONE VISIT)  [name], PharmD Clinical Pharmacist [facility] [phone]   Basics of Medicine Management Taking your medicines correctly is an important part of managing or preventing medical problems. Make sure you know what disease or condition your medicine is  treating, and how and when to take it. If you do not take your medicine correctly, it may not work well and may cause unpleasant side effects, including serious health problems. What should I do when I am taking medicines?  Read all the labels and inserts that come with your medicines. Review the information often.  Talk with your pharmacist if you get a refill and notice a change in the size, color, or shape of your medicines.  Know the potential side effects for each medicine that you take.  Try to get all your medicines from the same pharmacy. The pharmacist will have all your information and will understand how your medicines will affect each other (interact).  Tell your health care provider about all your medicines, including over-the-counter medicines, vitamins, and herbal or dietary supplements. He or she will make sure that nothing will interact with any of your prescribed medicines.   How can I take my medicines safely?  Take medicines only as told by your health care provider. ? Do not take more of your medicine than instructed. ? Do not take anyone else's medicines. ? Do not share your medicines with others. ? Do not stop taking your medicines unless your health care provider tells you to do so. ? You may need to avoid alcohol or certain foods or liquids when taking certain medicines. Follow your health care provider's instructions.  Do not split, mash, or chew your medicines unless your health care provider tells you to do so. Tell your health care provider if you have trouble swallowing your medicines.  For liquid medicine, use the dosing container that was provided. How should I organize my medicines? Know your medicines  Know what each of your medicines looks like. This includes size, color, and shape. Tell your health care provider if you are having trouble recognizing all the medicines that you are taking.  If you cannot tell your medicines apart because they look  similar, keep them in original bottles.  If you cannot read the labels on the bottles, tell your pharmacist to put your medicines in containers with large print.  Review your medicines and your schedule with family members, a friend, or a caregiver. Use a pill organizer  Use a tool to organize your medicine schedule. Tools include a weekly pillbox, a written chart, a notebook, or a calendar.  Your tool should help you remember the following things about each medicine: ? The name of the medicine. ? The amount (dose) to take. ? The schedule. This is the day and time the medicine should be taken. ? The appearance. This includes color, shape, size, and stamp. ? How to take your medicines. This includes instructions to take them with food, without food, with fluids, or with other medicines.  Create reminders for taking your medicines. Use sticky notes, or alarms on your watch, mobile device, or phone calendar.  You may choose to use a more advanced management system. These systems have storage, alarms, and visual and audio prompts.  Some medicines can be taken on an "as-needed" basis. These include medicines for nausea or pain. If you take an as-needed medicine, write down the name and dose, as well as the date  and time that you took it.   How should I plan for travel?  Take your pillbox, medicines, and organization system with you when traveling.  Have your medicines refilled before you travel. This will ensure that you do not run out of your medicines while you are away from home.  Always carry an updated list of your medicines with you. If there is an emergency, a first responder can quickly see what medicines you are taking.  Do not pack your medicines in checked luggage in case your luggage is lost or delayed.  If any of your medicines is considered a controlled substance, make sure you bring a letter from your health care provider with you. How should I store and discard my  medicines? For safe storage:  Store medicines in a cool, dry area away from light, or as directed by your health care provider. Do not store medicines in the bathroom. Heat and humidity will affect them.  Do not store your medicines with other chemicals, or with medicines for pets or other household members.  Keep medicines away from children and pets. Do not leave them on counters or bedside tables. Store them in high cabinets or on high shelves. For safe disposal:  Check expiration dates regularly. Do not take expired medicines. Discard medicines that are older than the expiration date.  Learn a safe way to dispose of your medicines. You may: ? Use a local government, hospital, or pharmacy medicine-take-back program. ? Mix the medicines with inedible substances, put them in a sealed bag or empty container, and throw them in the trash. What should I remember?  Tell your health care provider if you: ? Experience side effects. ? Have new symptoms. ? Have other concerns about taking your medicines.  Review your medicines regularly with your health care provider. Other medicines, diet, medical conditions, weight changes, and daily habits can all affect how medicines work. Ask if you need to continue taking each medicine, and discuss how well each one is working.  Refill your medicines early to avoid running out of them.  In case of an accidental overdose, call your local [place] at [phone] or visit your local emergency department immediately. This is important. Summary  Taking your medicines correctly is an important part of managing or preventing medical problems.  You need to make sure that you understand what you are taking a medicine for, as well as how and when you need to take it.  Know your medicines and use a pill organizer to help you take your medicines correctly.  In case of an accidental overdose, call your local [place] at [phone]  or visit your local emergency department immediately. This is important. This information is not intended to replace advice given to you by your health care provider. Make sure you discuss any questions you have with your health care provider. Document Revised: 01/06/2017 Document Reviewed: 01/06/2017 Elsevier Patient Education  2021 [employer].

## 2020-02-20 NOTE — Telephone Encounter (Signed)
I get the point about adjusting his Vania Rea but can he come in for an office visit first?  We can do his labs at the visit and go from there.  Thanks.

## 2020-02-20 NOTE — Telephone Encounter (Signed)
Okay to continue.  Sent.  Thanks. 

## 2020-02-20 NOTE — Telephone Encounter (Signed)
Pharmacy requests refill on: Tramadol 50 mg   LAST REFILL: 11/18/2019 (Q-60, R-2) LAST OV: 06/21/2019 NEXT OV: Not Scheduled  PHARMACY: [id number] [place], [place]

## 2020-02-20 NOTE — Telephone Encounter (Signed)
Pharmacy requests refill on: Livalo 1 mg   LAST REFILL: Unknown  LAST OV: 06/21/2019 NEXT OV: Not Scheduled  PHARMACY: [id number] [place], [place]  I am unable to see when this medication was ordered/refilled.

## 2020-02-20 NOTE — Telephone Encounter (Signed)
PCP consult regarding CCM 02/12/20:   Patient reports fasting BG levels 130-150 up to 180 depending on what he eats. He has gained 10 pounds and reports not eating well.  He is interested in increasing his Jardiance from 10 mg to 25 mg to improve BG and ejection fraction. He is currently on 10 mg daily started by Dr. Haroldine Laws and tolerating well.  Lab Results  Component Value Date/Time   HGBA1C 7.5 (H) 06/21/2019 09:45 AM   HGBA1C 7.6 (H) 04/06/2019 11:52 AM    Lab Results  Component Value Date   CREATININE 0.76 09/10/2019   BUN 22 09/10/2019   GFR 108.76 06/21/2019   GFRNONAA 101 09/10/2019   GFRAA 117 09/10/2019   NA 138 09/10/2019   K 4.8 09/10/2019   CALCIUM 9.1 09/10/2019   CO2 22 09/10/2019    [name], PharmD Clinical Pharmacist [facility] [phone]

## 2020-02-20 NOTE — Telephone Encounter (Signed)
Sent. Thanks.   

## 2020-02-21 ENCOUNTER — Other Ambulatory Visit: Payer: Self-pay | Admitting: Family Medicine

## 2020-02-21 NOTE — Telephone Encounter (Signed)
Spoke with patient and he agrees to come in to discuss; scheduled for 02/28/20 at 11:30 am.

## 2020-02-21 NOTE — Telephone Encounter (Signed)
Thank you for coordinating a follow up. That sounds great.  [name], PharmD Clinical Pharmacist [facility] [phone]

## 2020-02-22 NOTE — Telephone Encounter (Signed)
Refill request for alprazolam 0.25 mg tabs  LOV - 06/21/19 NOV - 02/28/20 Last refill - 11/26/19 #30/1

## 2020-02-24 NOTE — Telephone Encounter (Signed)
Sent. Thanks.   

## 2020-02-28 ENCOUNTER — Other Ambulatory Visit: Payer: Self-pay

## 2020-02-28 ENCOUNTER — Ambulatory Visit (INDEPENDENT_AMBULATORY_CARE_PROVIDER_SITE_OTHER): Payer: HMO | Admitting: Family Medicine

## 2020-02-28 ENCOUNTER — Encounter: Payer: Self-pay | Admitting: Family Medicine

## 2020-02-28 VITALS — BP 118/78 | HR 80 | Temp 97.2°F | Ht 76.0 in | Wt 289.0 lb

## 2020-02-28 DIAGNOSIS — E1165 Type 2 diabetes mellitus with hyperglycemia: Secondary | ICD-10-CM

## 2020-02-28 DIAGNOSIS — E1159 Type 2 diabetes mellitus with other circulatory complications: Secondary | ICD-10-CM | POA: Diagnosis not present

## 2020-02-28 DIAGNOSIS — IMO0002 Reserved for concepts with insufficient information to code with codable children: Secondary | ICD-10-CM

## 2020-02-28 LAB — POCT GLYCOSYLATED HEMOGLOBIN (HGB A1C): Hemoglobin A1C: 7.8 % — AB (ref 4.0–5.6)

## 2020-02-28 MED ORDER — EMPAGLIFLOZIN 25 MG PO TABS: 25.0000 mg | ORAL_TABLET | Freq: Every day | ORAL | 1 refills | Status: DC

## 2020-02-28 NOTE — Progress Notes (Signed)
This visit occurred during the SARS-CoV-2 public health emergency.  Safety protocols were in place, including screening questions prior to the visit, additional usage of staff PPE, and extensive cleaning of exam room while observing appropriate contact time as indicated for disinfecting solutions.  His brother recently died, d/w pt.  Condolences offered.  He had occ used BZD with relief, grief d/w pt.    DM2. A1c 7.8.   Using medications without difficulties: yes Hypoglycemic episodes:no Hyperglycemic episodes:no Feet problems:no Blood Sugars averaging: 160-170 eye exam within last year: yes Taking jardiance 10mg .  Still on glipizide and metformin.   Discussed trial of inc jardiance dose, up to 25mg .    Meds, vitals, and allergies reviewed.  ROS: Per HPI unless specifically indicated in ROS section   GEN: nad, alert and oriented HEENT: ncat NECK: supple w/o LA CV: rrr. PULM: ctab, no inc wob ABD: soft, +bs EXT: no edema SKIN: well perfused.

## 2020-02-28 NOTE — Patient Instructions (Signed)
Try the higher dose of jardiance, up to 25mg  a day.  If you get lightheaded, then update me.  Plan on recheck in about 3 months with A1c at the visit.  You don't have to fast.  Take care.  Glad to see you.

## 2020-03-01 ENCOUNTER — Encounter: Payer: Self-pay | Admitting: Family Medicine

## 2020-03-01 DIAGNOSIS — U071 COVID-19: Secondary | ICD-10-CM

## 2020-03-02 ENCOUNTER — Other Ambulatory Visit: Payer: Self-pay | Admitting: Cardiovascular Disease

## 2020-03-02 NOTE — Assessment & Plan Note (Signed)
  A1c 7.8 taking jardiance 10mg .  Still on glipizide and metformin.   Discussed trial of inc jardiance dose, up to 25mg .   Reasonable to try for now and we can recheck labs at follow-up visit.  See after visit summary.  He agrees.

## 2020-03-03 NOTE — Telephone Encounter (Signed)
I spoke with pt; today pt is feeling OK; pt [facility]; today pt prod cough is clear phlegm, pt taking mucinex and coricidin cold and flu; soreness in both lower ribs when pt coughs; pt taking tylenol but no fever today. When pt lays down on and off has SOB for short period; No CP or tightness in test today but on 03/01/20 had tightness in chest. FBS today was 124. No diarrhea or vomiting. Pt has good appetite. Pt has runny nose and head congestion; pt said has scratchy S/T when pt coughs. Pt wants Dr [name] to be aware has pt messaged cardiology since has CHF to see if cardiologist has any suggestions and also if pt needs med to let cardiologist be aware. Pt has had 2 covid vaccines and booster(immunization list updated). Pt wants to know how long should quarantine (pt wants to know if 5 day quarantine would be ok and does pt need any medication at this point. Advised pt to drink plenty of water, rest and take tylenol for fever and self quarantine. Pt request cb after Dr Josefine Class review.

## 2020-03-03 NOTE — Telephone Encounter (Signed)
[place] Night - Client TELEPHONE ADVICE RECORD [facility] Patient Name: Aaron Thompson Gender: Male DOB: 1960/10/02 Age: 60 Y 4 M 12 D Return Phone Number: [phone] (Primary), [phone] (Secondary) Address[place] [address] Client [place] Night - Client Client Site [place] Physician Aaron Thompson - MD Contact Type Call Who Is Calling Patient / Member / Family / Caregiver Call Type Triage / Clinical Relationship To Patient Self Return Phone Number [phone] (Primary) Chief Complaint Cough Reason for Call Symptomatic / Request for [name] states that he has Covid. He has heart problems. He has a scratchy throat and he is coughing. He also has body pain. Translation No Nurse Assessment Nurse: Aaron Nephew, RN, Aaron Thompson Date/Time (Eastern Time): 03/01/2020 1:01:30 PM Confirm and document reason for call. If symptomatic, describe symptoms. ---Caller states that he tested positive for COVID today per home test. he has body aches, a cough and a sore throat . Afebrile. Does the patient have any new or worsening symptoms? ---Yes Will a triage be completed? ---Yes Related visit to physician within the last 2 weeks? ---No Does the PT have any chronic conditions? (i.e. diabetes, asthma, this includes High risk factors for pregnancy, etc.) ---Yes List chronic conditions. ---Diabetes,CHF Is this a behavioral health or substance abuse call? ---No Guidelines Guideline Title Affirmed Question Affirmed Notes Nurse Date/Time ([place] Time) COVID-19 - Diagnosed or Suspected [1] COVID-19 diagnosed by positive lab test (e.g., PCR, rapid selftest kit) AND [2] mild symptoms (e.g., cough, fever, others) AND [9] no complications or SOB Wells, RN, Aaron Thompson 03/01/2020 1:02:31 PM Disp. Time Aaron Thompson Time) Disposition Final User 03/01/2020 1:22:09 PM Call PCP when  Office is Open Yes Aaron Nephew, RN, Aaron Thompson Disposition Overriden: Home Care Override Reason: Patient's symptoms need a higher level of care PLEASE NOTE: All timestamps contained within this report are represented as [place] Standard Time. CONFIDENTIALTY NOTICE: This fax transmission is intended only for the addressee. It contains information that is legally privileged, confidential or otherwise protected from use or disclosure. If you are not the intended recipient, you are strictly prohibited from reviewing, disclosing, copying using or disseminating any of this information or taking any action in reliance on or regarding this information. If you have received this fax in error, please notify us immediately by telephone so that we can arrange for its return to [place]. Phone: [phone], Toll-Free: [phone], Fax: [phone] Page: 2 of 2 Call Id: [id number] [place] Disagree/Comply Comply Caller Understands Yes PreDisposition Call a family member Care Advice Given Per Guideline CALL PCP WHEN OFFICE IS OPEN: * You need to discuss this with your doctor (or NP/PA) within the next few days. * Call the office when it is open. GENERAL CARE ADVICE FOR COVID-19 SYMPTOMS: * The symptoms are generally treated the same whether you have COVID-19, influenza or some other respiratory virus. * Cough: Use cough drops. * Feeling dehydrated: Drink extra liquids. If the air in your home is dry, use a humidifier. * Sore throat: Try throat lozenges, hard candy or warm chicken broth. * Muscle aches, headache, and other pains: Often this comes and goes with the fever. Take acetaminophen every 4 to 6 hours (Adults 650 mg) OR ibuprofen every 6 to 8 hours (Adults 400 mg). Before taking any medicine, read all the instructions on the package. * Fever: For fever over 101 F (38.3 C), take acetaminophen every 4 to 6 hours (Adults 650 mg) OR ibuprofen every  6 to 8 hours (Adults 400 mg). Before taking any medicine, read all the  instructions on the package. Do not take aspirin unless your doctor has prescribed it for you. * HOME REMEDY - HARD CANDY: Hard candy works just as well as over-the-counter cough drops. People who have diabetes should use sugar-free candy. * HOME REMEDY - HONEY: This old home remedy has been shown to help decrease coughing at night. The adult dosage is 2 teaspoons (10 ml) at bedtime. * Fever lasts over 3 days CALL BACK IF: * Fever over 103 F (39.4 C) * Chest pain or difficulty breathing occurs * You become worse * Your doctor (or NP/PA) can help you decide if a different test (such as PCR test) is needed. Talk with your doctor about your symptoms. Another option is for you to REPEAT THE RAPID TEST (SELF-TEST) AT HOME. * An error is more likely with tests performed at home. Rapid tests performed at a test site are usually more accurate. * Do not try to completely stop coughs that produce mucus and phlegm. COUGH MEDICINES: * Coughing is helpful. It brings up the mucus from the lungs and helps prevent pneumonia. HUMIDIFIER: * If the air is dry, use a humidifier in the bedroom. COUGHING SPELLS: * Drink warm fluids. Inhale warm mist. This can help relax the airway and also loosen up phlegm. * Suck on cough drops or hard candy to coat the irritated throat. * Treat fevers above 101 F (38.3 C). The goal of fever therapy is to bring the fever down to a comfortable level. Remember that fever medicine usually lowers fever 2 degrees F (1 - 1 1/2 degrees C). * STAY HOME A MINIMUM OF 5 DAYS: Home isolation is needed for at least 5 days after the symptoms started. Stay home from school or work if you are sick. Do NOT go to religious services, child care centers, shopping, or other public places. Do NOT use public transportation (e.g., bus, taxis, ride-sharing). Do NOT allow any visitors to your home. Leave the house only if you need to seek urgent medical care. * WEAR A MASK FOR 10 DAYS: Wear a well-fitted mask  for 10 full days any time you are around others inside your home or in public. Do not go to places where you are unable to wear a mask. CARE ADVICE given per COVID-19 - DIAGNOSED OR SUSPECTED (Adult) guideline. CALL BACK IF: * Fever over 103 F (39.4 C) * Chest pain or difficulty breathing occurs * You become worse Referrals REFERRED TO PCP OFFICE

## 2020-03-03 NOTE — Telephone Encounter (Signed)
Called patient about putting in referral. Patient's PCP already put referral in. Patient thank [place] for the call.

## 2020-03-04 ENCOUNTER — Other Ambulatory Visit: Payer: Self-pay | Admitting: Adult Health

## 2020-03-04 ENCOUNTER — Telehealth: Payer: Self-pay | Admitting: Adult Health

## 2020-03-04 DIAGNOSIS — U071 COVID-19: Secondary | ICD-10-CM

## 2020-03-04 NOTE — Telephone Encounter (Signed)
I connected by phone with Aaron Thompson on 03/04/2020 at 2:05 PM to discuss the potential use of a new treatment for mild to moderate COVID-19 viral infection in non-hospitalized patients.  This patient is a 60 y.o. male that meets the FDA criteria for Emergency Use Authorization of COVID monoclonal antibody sotrovimab.  Has a (+) direct SARS-CoV-2 viral test result  Has mild or moderate COVID-19   Is NOT hospitalized due to COVID-19  Is within 10 days of symptom onset  Has at least one of the high risk factor(s) for progression to severe COVID-19 and/or hospitalization as defined in EUA.  Specific high risk criteria : Older age (>/= 59 yo), Diabetes and Cardiovascular disease or hypertension   I have spoken and communicated the following to the patient or parent/caregiver regarding COVID monoclonal antibody treatment:  1. FDA has authorized the emergency use for the treatment of mild to moderate COVID-19 in adults and pediatric patients with positive results of direct SARS-CoV-2 viral testing who are 2 years of age and older weighing at least 40 kg, and who are at high risk for progressing to severe COVID-19 and/or hospitalization.  2. The significant known and potential risks and benefits of COVID monoclonal antibody, and the extent to which such potential risks and benefits are unknown.  3. Information on available alternative treatments and the risks and benefits of those alternatives, including clinical trials.  4. Patients treated with COVID monoclonal antibody should continue to self-isolate and use infection control measures (e.g., wear mask, isolate, social distance, avoid sharing personal items, clean and disinfect "high touch" surfaces, and frequent handwashing) according to CDC guidelines.   5. The patient or parent/caregiver has the option to accept or refuse COVID monoclonal antibody treatment. 6. Cost reviewed; sx onset 2/4  After reviewing this information with the  patient, the patient has agreed to receive one of the available covid 19 monoclonal antibodies and will be provided an appropriate fact sheet prior to infusion. Scot Dock, NP 03/04/2020 2:05 PM

## 2020-03-05 ENCOUNTER — Ambulatory Visit (HOSPITAL_COMMUNITY)
Admission: RE | Admit: 2020-03-05 | Discharge: 2020-03-05 | Disposition: A | Discharge status: Home / Self Care | Payer: HMO | Source: Ambulatory Visit | Attending: Pulmonary Disease | Admitting: Pulmonary Disease

## 2020-03-05 ENCOUNTER — Ambulatory Visit (INDEPENDENT_AMBULATORY_CARE_PROVIDER_SITE_OTHER): Payer: HMO

## 2020-03-05 ENCOUNTER — Other Ambulatory Visit: Payer: Self-pay

## 2020-03-05 DIAGNOSIS — E119 Type 2 diabetes mellitus without complications: Secondary | ICD-10-CM | POA: Insufficient documentation

## 2020-03-05 DIAGNOSIS — R54 Age-related physical debility: Secondary | ICD-10-CM | POA: Insufficient documentation

## 2020-03-05 DIAGNOSIS — I428 Other cardiomyopathies: Secondary | ICD-10-CM

## 2020-03-05 DIAGNOSIS — U071 COVID-19: Secondary | ICD-10-CM | POA: Insufficient documentation

## 2020-03-05 DIAGNOSIS — I5022 Chronic systolic (congestive) heart failure: Secondary | ICD-10-CM

## 2020-03-05 DIAGNOSIS — I10 Essential (primary) hypertension: Secondary | ICD-10-CM | POA: Diagnosis not present

## 2020-03-05 MED ORDER — SODIUM CHLORIDE 0.9 % IV SOLN: INTRAVENOUS | Status: DC | PRN

## 2020-03-05 MED ORDER — METHYLPREDNISOLONE SODIUM SUCC 125 MG IJ SOLR: 125.0000 mg | Freq: Once | INTRAMUSCULAR | Status: DC | PRN

## 2020-03-05 MED ORDER — EPINEPHRINE 0.3 MG/0.3ML IJ SOAJ: 0.3000 mg | Freq: Once | INTRAMUSCULAR | Status: DC | PRN

## 2020-03-05 MED ORDER — DIPHENHYDRAMINE HCL 50 MG/ML IJ SOLN: 50.0000 mg | Freq: Once | INTRAMUSCULAR | Status: DC | PRN

## 2020-03-05 MED ORDER — SOTROVIMAB 500 MG/8ML IV SOLN
500.0000 mg | Freq: Once | INTRAVENOUS | Status: AC
  Administered 2020-03-05: 500 mg via INTRAVENOUS

## 2020-03-05 MED ORDER — FAMOTIDINE IN NACL 20-0.9 MG/50ML-% IV SOLN: 20.0000 mg | Freq: Once | INTRAVENOUS | Status: DC | PRN

## 2020-03-05 MED ORDER — ALBUTEROL SULFATE HFA 108 (90 BASE) MCG/ACT IN AERS: 2.0000 | INHALATION_SPRAY | Freq: Once | RESPIRATORY_TRACT | Status: DC | PRN

## 2020-03-05 MED ORDER — DOFETILIDE 500 MCG PO CAPS: 500.0000 ug | ORAL_CAPSULE | Freq: Two times a day (BID) | ORAL | 2 refills | Status: DC

## 2020-03-05 NOTE — Discharge Instructions (Signed)

## 2020-03-05 NOTE — Progress Notes (Signed)
Patient reviewed Fact Sheet for Patients, Parents, and Caregivers for Emergency Use Authorization (EUA) of sotrovimab for the Treatment of Coronavirus. Patient also reviewed and is agreeable to the estimated cost of treatment. Patient is agreeable to proceed.   

## 2020-03-05 NOTE — Progress Notes (Signed)
Diagnosis: COVID-19  Physician: Dr. Patrick Wright  Procedure: Covid Infusion Clinic Med: Sotrovimab infusion - Provided patient with sotrovimab fact sheet for patients, parents, and caregivers prior to infusion.   Complications: No immediate complications noted  Discharge: Discharged home    

## 2020-03-06 LAB — CUP PACEART REMOTE DEVICE CHECK
Battery Remaining Longevity: 68 mo
Battery Remaining Percentage: 83 %
Battery Voltage: 3.01 V
Brady Statistic AP VP Percent: 1 %
Brady Statistic AP VS Percent: 1 %
Brady Statistic AS VP Percent: 97 %
Brady Statistic AS VS Percent: 1.1 %
Brady Statistic RA Percent Paced: 1 %
Date Time Interrogation Session: 20220209023453
HighPow Impedance: 81 Ohm
HighPow Impedance: 81 Ohm
Implantable Lead Implant Date: 20150121
Implantable Lead Implant Date: 20150121
Implantable Lead Implant Date: 20150121
Implantable Lead Location: 753858
Implantable Lead Location: 753859
Implantable Lead Location: 753860
Implantable Lead Model: 7122
Implantable Pulse Generator Implant Date: 20210512
Lead Channel Impedance Value: 480 Ohm
Lead Channel Impedance Value: 560 Ohm
Lead Channel Impedance Value: 860 Ohm
Lead Channel Pacing Threshold Amplitude: 0.875 V
Lead Channel Pacing Threshold Amplitude: 1.125 V
Lead Channel Pacing Threshold Amplitude: 2.125 V
Lead Channel Pacing Threshold Pulse Width: 0.4 ms
Lead Channel Pacing Threshold Pulse Width: 0.4 ms
Lead Channel Pacing Threshold Pulse Width: 0.5 ms
Lead Channel Sensing Intrinsic Amplitude: 12 mV
Lead Channel Sensing Intrinsic Amplitude: 5 mV
Lead Channel Setting Pacing Amplitude: 1.875
Lead Channel Setting Pacing Amplitude: 2.125
Lead Channel Setting Pacing Amplitude: 3.125
Lead Channel Setting Pacing Pulse Width: 0.4 ms
Lead Channel Setting Pacing Pulse Width: 0.5 ms
Lead Channel Setting Sensing Sensitivity: 0.5 mV
Pulse Gen Serial Number: 9890691

## 2020-03-10 NOTE — Progress Notes (Signed)
Remote ICD transmission.   

## 2020-03-11 ENCOUNTER — Telehealth: Payer: Self-pay

## 2020-03-11 ENCOUNTER — Telehealth: Payer: Self-pay | Admitting: Family Medicine

## 2020-03-11 NOTE — Telephone Encounter (Signed)
Patient states that his insurance company has been trying to reach [place] in reference to his diabetes.  He states that he needs [place] to reach out to them to verify his diagnosis of diabetes and other health elements.  Please call them at [phone]

## 2020-03-11 NOTE — Telephone Encounter (Signed)
Spoke with patients insurance company and they states they faxed over some paperwork that needs to be filled out. Do you have these yet?

## 2020-03-11 NOTE — Chronic Care Management (AMB) (Addendum)
Chronic Care Management Pharmacy Assistant   Name: Aaron Thompson  MRN: [id number] DOB: November 08, 1960  Reason for Encounter: Disease State- Blood glucose log update  PCP : [name], MD  Allergies:   Allergies  Allergen Reactions   Atorvastatin     Muscle pain     Medications: Outpatient Encounter Medications as of 03/11/2020  Medication Sig   acetaminophen (TYLENOL) 325 MG tablet Take 650 mg by mouth every 6 (six) hours as needed for mild pain or moderate pain.   ALPRAZolam (XANAX) 0.25 MG tablet TAKE 1 TABLET BY MOUTH AS NEEDED FOR ANXIETY   carvedilol (COREG) 25 MG tablet TAKE 2 TABLETS(50 MG) BY MOUTH TWICE DAILY WITH A MEAL   cetirizine (ZYRTEC) 10 MG tablet Take 10 mg by mouth daily.   dofetilide (TIKOSYN) 500 MCG capsule Take 1 capsule (500 mcg total) by mouth 2 (two) times daily.   empagliflozin (JARDIANCE) 25 MG TABS tablet Take 1 tablet (25 mg total) by mouth daily.   glipiZIDE (GLUCOTROL XL) 10 MG 24 hr tablet TAKE 1 TABLET(10 MG) BY MOUTH DAILY WITH BREAKFAST   glucose blood (ONE TOUCH ULTRA TEST) test strip CHECK GLUCOSE TWICE DAILY AND AS NEEDED, DX. E11.65 (UNCONTROLLED DM)   Magnesium Oxide 400 MG CAPS Take 1 capsule (400 mg total) by mouth daily.   metFORMIN (GLUCOPHAGE-XR) 500 MG 24 hr tablet TAKE 3 TABLETS(1500 MG) BY MOUTH DAILY WITH BREAKFAST   Multiple Vitamin (MULTIVITAMIN) tablet Take 1 tablet by mouth daily.   Omeprazole-Sodium Bicarbonate (ZEGERID) 20-1100 MG CAPS capsule Take 1 capsule by mouth 2 (two) times daily.   Pitavastatin Calcium (LIVALO) 1 MG TABS 1 tablet daily if tolerated.  If muscle aches develop, cut back to every other day.   potassium chloride SA (KLOR-CON) 20 MEQ tablet TAKE 2 TABLETS(40 MEQ) BY MOUTH DAILY   sacubitril-valsartan (ENTRESTO) 97-103 MG Take 1 tablet by mouth 2 (two) times daily. Pt need to keep upcoming appt in June for further refills   spironolactone (ALDACTONE) 25 MG tablet TAKE 1 TABLET(25 MG) BY MOUTH DAILY    traMADol (ULTRAM) 50 MG tablet TAKE 1 TABLET(50 MG) BY MOUTH EVERY 12 HOURS AS NEEDED FOR PAIN   XARELTO 20 MG TABS tablet TAKE 1 TABLET(20 MG) BY MOUTH DAILY WITH SUPPER   No facility-administered encounter medications on file as of 03/11/2020.    Current Diagnosis: Patient Active Problem List   Diagnosis Date Noted   ICD (implantable cardioverter-defibrillator) in place 02/04/2020   Shoulder pain, bilateral 12/01/2018   Low back pain 09/07/2016   Family history of prostate cancer 06/23/2016   Advance care planning 06/23/2016   Colon polyps 06/23/2016   Healthcare maintenance 03/08/2016   A-fib ([place]) 10/14/2015   Inappropriate shocks from ICD (implantable cardioverter-defibrillator) 03/17/2015   Obesity (BMI 30-39.9) 12/05/2014   ICD (implantable cardioverter-defibrillator) discharge 53/29/9242   Chronic systolic heart failure ([place]) 02/14/2013   Hyperlipidemia 02/05/2013   Uncontrolled type 2 diabetes mellitus with circulatory disorder, without long-term current use of insulin ([place]) 09/30/2011   Nonischemic cardiomyopathy ([place]) 08/18/2011   Chest pain on exertion 08/17/2011   Other restrictive cardiomyopathy ([place]) 07/16/2009   OSTEOARTHROSIS UNSPEC WHETHER GEN/LOCALIZED HAND 08/16/2008   Essential hypertension 05/11/2007   History of renal calculi 05/11/2007   Fatty liver 03/20/2007   ANXIETY 10/04/2006   HEARING IMPAIRMENT 10/04/2006   GERD 09/20/2006    Recent Relevant Labs: Lab Results  Component Value Date/Time   HGBA1C 7.8 (A) 02/28/2020 11:34 AM  HGBA1C 7.5 (H) 06/21/2019 09:45 AM   HGBA1C 7.6 (H) 04/06/2019 11:52 AM   MICROALBUR 1.7 10/09/2010 11:36 AM    Kidney Function Lab Results  Component Value Date/Time   CREATININE 0.76 09/10/2019 09:58 AM   CREATININE 0.74 06/21/2019 09:45 AM   CREATININE 0.77 10/01/2015 12:02 PM   CREATININE 0.91 08/28/2015 09:09 AM   GFR 108.76 06/21/2019 09:45 AM   GFRNONAA 101 09/10/2019 09:58 AM   GFRAA 117 09/10/2019 09:58 AM     Current antihyperglycemic regimen:  Jardiance 25 mg- 1 tablet daily (increased 02/28/20 by Dr. [name]) Metformin ER 500 mg - 3 tablets with breakfast Glipizide ER 10 mg - 1 tablet daily   How often are you checking your blood sugar? once daily- not consistently  What are your blood sugars ranging?   Fasting:  109 120 161 Before meals: [name] After meals:  164 Bedtime: [name]  On insulin? No  Adherence Review: Is the patient currently on a STATIN medication? Yes Is the patient currently on ACE/ARB medication? Yes Does the patient have >5 day gap between last estimated fill dates? CPP to review  States he only has a few readings because he does not check regularly. He states he will start checking daily. He feels like his readings have improved since starting the increased dose of Jardiance. Notes that he recently recovered from [place] and his brother passed away a few months ago from [place].   Follow-Up:  Pharmacist Review  [name], CPP notified  [name], [place] Assistant [phone]  I have reviewed the care management and care coordination activities outlined in this encounter and I am certifying that I agree with the content of this note. Follow up next month for blood glucose log review and Jardiance tolerance.  [name], PharmD Clinical Pharmacist [place] [facility] at [facility] [phone]

## 2020-03-12 NOTE — Telephone Encounter (Signed)
Caryl Pina with [employer] called stating that they faxed over the form yesterday. Caryl Pina stated that they need the form completed and faxed back as soon as possible. Riccardo Dubin that we will get the form back to her as soon as it is received and completed. Riccardo Dubin that our doctors are busy seeing patients and will complete paperwork as soon as they have a chance to get it done. Caryl Pina stated that they need the form back before the end of the month. Telephone # [phone]

## 2020-03-13 ENCOUNTER — Other Ambulatory Visit: Payer: Self-pay

## 2020-03-13 MED ORDER — GLUCOSE BLOOD VI STRP: ORAL_STRIP | 5 refills | Status: DC

## 2020-03-13 NOTE — Telephone Encounter (Signed)
Form done. Thanks. 

## 2020-03-13 NOTE — Telephone Encounter (Signed)
Form has been faxed back to [employer] and received confirmation that fax went through.

## 2020-03-14 ENCOUNTER — Other Ambulatory Visit: Payer: Self-pay

## 2020-03-14 MED ORDER — GLUCOSE BLOOD VI STRP: ORAL_STRIP | 5 refills | Status: DC

## 2020-03-14 NOTE — Progress Notes (Signed)
Patient states [facility] gave him wrong test strips. Rx resent.

## 2020-03-28 ENCOUNTER — Telehealth: Payer: Self-pay

## 2020-03-28 ENCOUNTER — Other Ambulatory Visit: Payer: Self-pay

## 2020-03-28 MED ORDER — ONETOUCH ULTRA 2 W/DEVICE KIT: PACK | 0 refills | Status: AC

## 2020-03-28 NOTE — Chronic Care Management (AMB) (Addendum)
Chronic Care Management Pharmacy Assistant   Name: Aaron Thompson  MRN: [id number] DOB: [birth year]  Reason for Encounter: Disease State- Blood glucose log update and Medication Tolerance   PCP : [name], MD  Allergies:   Allergies  Allergen Reactions   Atorvastatin     Muscle pain     Medications: Outpatient Encounter Medications as of 59/04/2020  Medication Sig   acetaminophen (TYLENOL) 325 MG tablet Take 650 mg by mouth every 6 (six) hours as needed for mild pain or moderate pain.   ALPRAZolam (XANAX) 0.25 MG tablet TAKE 1 TABLET BY MOUTH AS NEEDED FOR ANXIETY   carvedilol (COREG) 25 MG tablet TAKE 2 TABLETS(50 MG) BY MOUTH TWICE DAILY WITH A MEAL   cetirizine (ZYRTEC) 10 MG tablet Take 10 mg by mouth daily.   dofetilide (TIKOSYN) 500 MCG capsule Take 1 capsule (500 mcg total) by mouth 2 (two) times daily.   empagliflozin (JARDIANCE) 25 MG TABS tablet Take 1 tablet (25 mg total) by mouth daily.   glipiZIDE (GLUCOTROL XL) 10 MG 24 hr tablet TAKE 1 TABLET(10 MG) BY MOUTH DAILY WITH BREAKFAST   glucose blood (ONE TOUCH ULTRA TEST) test strip CHECK GLUCOSE TWICE DAILY AND AS NEEDED, DX. E11.65 (UNCONTROLLED DM)   Magnesium Oxide 400 MG CAPS Take 1 capsule (400 mg total) by mouth daily.   metFORMIN (GLUCOPHAGE-XR) 500 MG 24 hr tablet TAKE 3 TABLETS(1500 MG) BY MOUTH DAILY WITH BREAKFAST   Multiple Vitamin (MULTIVITAMIN) tablet Take 1 tablet by mouth daily.   Omeprazole-Sodium Bicarbonate (ZEGERID) 20-1100 MG CAPS capsule Take 1 capsule by mouth 2 (two) times daily.   Pitavastatin Calcium (LIVALO) 1 MG TABS 1 tablet daily if tolerated.  If muscle aches develop, cut back to every other day.   potassium chloride SA (KLOR-CON) 20 MEQ tablet TAKE 2 TABLETS(40 MEQ) BY MOUTH DAILY   sacubitril-valsartan (ENTRESTO) 97-103 MG Take 1 tablet by mouth 2 (two) times daily. Pt need to keep upcoming appt in June for further refills   spironolactone (ALDACTONE) 25 MG tablet TAKE 1 TABLET(25  MG) BY MOUTH DAILY   traMADol (ULTRAM) 50 MG tablet TAKE 1 TABLET(50 MG) BY MOUTH EVERY 12 HOURS AS NEEDED FOR PAIN   XARELTO 20 MG TABS tablet TAKE 1 TABLET(20 MG) BY MOUTH DAILY WITH SUPPER   No facility-administered encounter medications on file as of 59/04/2020.    Current Diagnosis: Patient Active Problem List   Diagnosis Date Noted   ICD (implantable cardioverter-defibrillator) in place 02/04/2020   Shoulder pain, bilateral 12/01/2018   Low back pain 09/07/2016   Family history of prostate cancer 06/23/2016   Advance care planning 06/23/2016   Colon polyps 06/23/2016   Healthcare maintenance 03/08/2016   A-fib ([place]) 10/14/2015   Inappropriate shocks from ICD (implantable cardioverter-defibrillator) 03/17/2015   Obesity (BMI 30-39.9) 12/05/2014   ICD (implantable cardioverter-defibrillator) discharge 42/68/3419   Chronic systolic heart failure ([place]) 02/14/2013   Hyperlipidemia 02/05/2013   Uncontrolled type 2 diabetes mellitus with circulatory disorder, without long-term current use of insulin ([place]) 09/30/2011   Nonischemic cardiomyopathy ([place]) 08/18/2011   Chest pain on exertion 08/17/2011   Other restrictive cardiomyopathy ([place]) 07/16/2009   OSTEOARTHROSIS UNSPEC WHETHER GEN/LOCALIZED HAND 08/16/2008   Essential hypertension 05/11/2007   History of renal calculi 05/11/2007   Fatty liver 03/20/2007   ANXIETY 10/04/2006   HEARING IMPAIRMENT 10/04/2006   GERD 09/20/2006    Recent Relevant Labs: Lab Results  Component Value Date/Time   HGBA1C 7.8 (A) 02/28/2020  11:34 AM   HGBA1C 7.5 (H) 06/21/2019 09:45 AM   HGBA1C 7.6 (H) 04/06/2019 11:52 AM   MICROALBUR 1.7 10/09/2010 11:36 AM    Kidney Function Lab Results  Component Value Date/Time   CREATININE 0.76 09/10/2019 09:58 AM   CREATININE 0.74 06/21/2019 09:45 AM   CREATININE 0.77 10/01/2015 12:02 PM   CREATININE 0.91 08/28/2015 09:09 AM   GFR 108.76 06/21/2019 09:45 AM   GFRNONAA 101 09/10/2019 09:58 AM   GFRAA 117  09/10/2019 09:58 AM    Reviewed chart prior to disease state call. Spoke with patient regarding blood glucose  Current antihyperglycemic regimen:  Jardiance 25 mg- 1 tablet daily (increased 02/28/20 by Dr. [name]) Metformin ER 500 mg - 3 tablets with breakfast Glipizide ER 10 mg - 1 tablet daily   Verbally confirms adherence to above medications? Yes  How often are you checking your blood sugar? before breakfast  What are your blood sugars ranging?   Fasting:  03/12/20- 144 03/13/20- 165 03/14/20- 141 03/15/20- 167 03/16/20- 147 03/17/20- 146 03/18/20- 145 03/19/20- 152 03/20/20- 169 03/21/20- 161 03/22/20- 167 03/23/20- 141 03/24/20- 163 03/25/20- 169 03/26/20- 143 03/27/20- 159 03/28/20- 143  Before meals: [name] After meals: [name] Bedtime: [name]  On insulin? No  Adherence Review: Is the patient currently on a STATIN medication? Yes Is the patient currently on ACE/ARB medication? Yes Does the patient have >5 day gap between last estimated fill dates? CPP to review  Patient states he has been working hard on his diet. He is discouraged due to some of the meals he eats that he feels are diabetic friendly cause blood sugars in the 160s when he thinks that he made poor food choices he is getting readings in the 140s. Questions if it could be his meter. States meter is 59 years old. He asked if Dr. [name] would write a prescription for a new meter. He requests OneTouch Ultra II since he already has the test strips for it. Will request new meter for him.  Follow-Up:  Pharmacist Review  [name], CPP notified  [name], [place] Assistant [phone]  I have reviewed the care management and care coordination activities outlined in this encounter and I am certifying that I agree with the content of this note. CCM follow up in 30 days. Consider dietician referral.  [name], PharmD Clinical Pharmacist [place] [facility] at [facility] [phone]  Total  time spent for month: 59

## 2020-03-28 NOTE — Telephone Encounter (Signed)
-----   Message from Aaron Thompson sent at 03/28/2020 12:55 PM EST ----- Regarding: RX for new meter Contact: [phone] Patient states his current Glucometer is about 59 years old. He is requesting that Dr. [name] send in a prescription for a new meter. Patient is requesting OneTouch Ultra II since he already has test strips.

## 2020-03-28 NOTE — Telephone Encounter (Signed)
New meter has been sent in for patient and patient has been notified.

## 2020-03-30 NOTE — Progress Notes (Signed)
 ADVANCED HF CLINIC NOTE  Referring Physician: Dr. Nishan  Primary Cardiologist: Dr. Nishan  HPI:  Aaron Thompsonis a 59 y.o.malewith PAF s/p AF ablation , HTN, morbid obesity, OSA unable to tolerate CPAP, DM, LBBB and chronic systolic HF (EF 20-25%) due to NICM s/p STJ CRT-D (cath 2013 minimal CAD)  referred by Dr. Nishan for HF evaluation   We have not seen him since 12/19. He presents for f/u.   Diagnosed with systolic HF in 2013. Cath at that time minimal CAD. Had STJ CRT-D placed.   Presented to ER with new-onset AF 7/17.Had DCCV in ER. Admitted the same month with ERAF. Felt to to have both AFib and ATach. Subsequently seen by Dr. Allred and had afib ablation in 9/17. Subsequently had recurrent AF with RVR leading to ICD shocks. Placed back on tikosyn and anticoagulation.   Echo 12/14/16 EF 20-25% Last echo 1/20 EF 25-30%   CRT-D gen change 5/21  Saw Dr. Klein in 1/22 and being evaluated for symptomatic PVCs. On ICD interrogation in 2/22 burden was 1.3%  Says he is doing pretty well but gets SOB if he tries to pick up anything heavy. Still playing golf and doing other activities without problem but hasn't been walking as much. No edema, orthopnea or PND. Compliant with meds.   ICD interrogation in clinic today. No VT/VF. AF burden < 1% BiV pacing 97% Volume status up and down. Ok today. Personally reviewed     Past Medical History:  Diagnosis Date  . AICD (automatic cardioverter/defibrillator) present   . Anxiety state, unspecified   . Calculus of kidney 1981   "passed it on my own"  . Chronic systolic CHF (congestive heart failure) (HCC)   . Dermatophytosis of the body   . DJD (degenerative joint disease)   . Esophageal reflux   . Hx of colonic polyps   . Hypertension   . LBBB (left bundle branch block)    intermittent  . Non-ischemic cardiomyopathy (HCC)    a. cath 2013: minor nonobstructive CAD.  . OSA (obstructive sleep apnea)    he can't tolerate  CPAP.   . Osteoarthrosis, unspecified whether generalized or localized, hand   . Other chronic nonalcoholic liver disease    fatty liver  . PAF (paroxysmal atrial fibrillation) (HCC)    a. s/p DCCV 6/11; previously on Pradaxa;  b. Event Monitor 2012->No PAF;  c. 09/2011 s/p DCCV ->Xarelto started. d. 03/2014: inappropriate ICD shocks for AF-RVR, started on Tikosyn, Xarelto restarted.  . Type II diabetes mellitus (HCC)   . Unspecified hearing loss    no hearing aid    Current Outpatient Medications  Medication Sig Dispense Refill  . acetaminophen (TYLENOL) 325 MG tablet Take 650 mg by mouth every 6 (six) hours as needed for mild pain or moderate pain.    . ALPRAZolam (XANAX) 0.25 MG tablet TAKE 1 TABLET BY MOUTH AS NEEDED FOR ANXIETY 30 tablet 1  . Blood Glucose Monitoring Suppl (ONE TOUCH ULTRA 2) w/Device KIT Use daily to check sugars as needed. Dx E11.9 1 kit 0  . carvedilol (COREG) 25 MG tablet TAKE 2 TABLETS(50 MG) BY MOUTH TWICE DAILY WITH A MEAL 360 tablet 2  . dofetilide (TIKOSYN) 500 MCG capsule Take 1 capsule (500 mcg total) by mouth 2 (two) times daily. 180 capsule 2  . empagliflozin (JARDIANCE) 25 MG TABS tablet Take 1 tablet (25 mg total) by mouth daily. 90 tablet 1  . glipiZIDE (GLUCOTROL XL) 10 MG 24   hr tablet TAKE 1 TABLET(10 MG) BY MOUTH DAILY WITH BREAKFAST 30 tablet 11  . glucose blood (ONE TOUCH ULTRA TEST) test strip CHECK GLUCOSE TWICE DAILY AND AS NEEDED, DX. E11.65 (UNCONTROLLED DM) 100 each 5  . loratadine (CLARITIN) 10 MG tablet Take 10 mg by mouth daily as needed for allergies.    . Magnesium Oxide 400 MG CAPS Take 1 capsule (400 mg total) by mouth daily. 90 capsule 3  . metFORMIN (GLUCOPHAGE-XR) 500 MG 24 hr tablet TAKE 3 TABLETS(1500 MG) BY MOUTH DAILY WITH BREAKFAST 90 tablet 5  . Multiple Vitamin (MULTIVITAMIN) tablet Take 1 tablet by mouth daily.    [name] Bicarbonate (ZEGERID) 20-1100 MG CAPS capsule Take 1 capsule by mouth 2 (two) times daily.     . Pitavastatin Calcium (LIVALO) 1 MG TABS 1 tablet daily if tolerated.  If muscle aches develop, cut back to every other day. 90 tablet 0  . potassium chloride SA (KLOR-CON) 20 MEQ tablet TAKE 2 TABLETS(40 MEQ) BY MOUTH DAILY    . sacubitril-valsartan (ENTRESTO) 97-103 MG Take 1 tablet by mouth 2 (two) times daily. Pt need to keep upcoming appt in June for further refills 180 tablet 2  . spironolactone (ALDACTONE) 25 MG tablet TAKE 1 TABLET(25 MG) BY MOUTH DAILY 90 tablet 2  . traMADol (ULTRAM) 50 MG tablet TAKE 1 TABLET(50 MG) BY MOUTH EVERY 12 HOURS AS NEEDED FOR PAIN 60 tablet 2  . XARELTO 20 MG TABS tablet TAKE 1 TABLET(20 MG) BY MOUTH DAILY WITH SUPPER 30 tablet 5   No current facility-administered medications for this encounter.    Allergies  Allergen Reactions  . Atorvastatin     Muscle pain       Social History   Socioeconomic History  . Marital status: Married    Spouse name: Not on file  . Number of children: 2  . Years of education: Not on file  . Highest education level: Not on file  Occupational History  . Occupation: Banker: UNEMPLOYED  Tobacco Use  . Smoking status: Never Smoker  . Smokeless tobacco: Former Systems developer    Types: Snuff  . Tobacco comment: 02/14/2013 "quit snuff in 2006"  Vaping Use  . Vaping Use: Never used  Substance and Sexual Activity  . Alcohol use: No    Alcohol/week: 0.0 standard drinks  . Drug use: No  . Sexual activity: Yes  Other Topics Concern  . Not on file  Social History Narrative   Lives in [place] with wife.  Works @ [employer] in [employer].   Married 1990   Social Determinants of Health   Financial Resource Strain: Low Risk   . Difficulty of Paying Living Expenses: Not very hard  Food Insecurity: No Food Insecurity  . Worried About Charity fundraiser in the Last Year: Never true  . Ran Out of Food in the Last Year: Never true  Transportation Needs: No Transportation Needs  . Lack of Transportation  (Medical): No  . Lack of Transportation (Non-Medical): No  Physical Activity: Inactive  . Days of Exercise per Week: 0 days  . Minutes of Exercise per Session: 0 min  Stress: No Stress Concern Present  . Feeling of Stress : Not at all  Social Connections: Not on file  Intimate Partner Violence: Not At Risk  . Fear of Current or Ex-Partner: No  . Emotionally Abused: No  . Physically Abused: No  . Sexually Abused: No  Family History  Problem Relation Age of Onset  . Hypertension Mother   . Diabetes Mother   . Heart failure Mother        Died @ 90  . Hypertension Father   . Heart failure Father        Died @ 79  . Prostate cancer Father   . Diabetes Brother   . Kidney disease Brother   . Diabetes Brother   . Prostate cancer Brother   . Diabetes Brother   . Other Sister   . Heart attack Paternal Grandfather   . Stroke Paternal Grandmother   . Colon cancer Paternal Grandmother   . Multiple myeloma Sister   . Leukemia Sister   . Colon polyps Sister   . Cirrhosis Maternal Uncle        alcohol related  . Esophageal cancer Neg Hx   . Stomach cancer Neg Hx   . Pancreatic cancer Neg Hx     Vitals:   03/31/20 0942  BP: 104/70  Pulse: 85  SpO2: 97%  Weight: 132.5 kg (292 lb 3.2 oz)    PHYSICAL EXAM: General:  Well appearing. No resp difficulty HEENT: normal Neck: supple. no JVD. Carotids 2+ bilat; no bruits. No lymphadenopathy or thryomegaly appreciated. Cor: PMI nondisplaced. Regular rate & rhythm. No rubs, gallops or murmurs. Lungs: clear Abdomen: soft, nontender, nondistended. No hepatosplenomegaly. No bruits or masses. Good bowel sounds. Extremities: no cyanosis, clubbing, rash, edema Neuro: alert & orientedx3, cranial nerves grossly intact. moves all 4 extremities w/o difficulty. Affect pleasant  ECG: NSR with v-pacing 88 Personally reviewed  ASSESSMENT & PLAN: 1. Chronic systolic failure due to NICM s/p STJ CRT-D - cath 2013 non-obstructive CAD - Echo  11/18 EF 20-25%  - Echo 1/20 EF 25-30% - Overall doing fairly well. NYHA II but some mild increase in dyspnea lately. Unclear if related to volume or recent inactivity - Fluid up and down on ICD. Add lasix 40 + kcl 20 on every [place] - ICD interrogated as above - On goal-dose HF meds: Entresto 97/103 bid, carvedilol 25 bid, spiro 25 daily. + Jardiance 25mg daily  - Will repeat echo and see back in 3-4 months. If symptoms not improved with increasing his walking and adding weekly lasix can repeat CPX - Labs today and 3 weeks.   2. PAF - s/p AF ablation - maintaining NSR on Tikosyn. Continue Xarelto for AC - Followed by Dr. Klein - No bleeding on Xarelto. Check CBC today  3. OSA - unable to tolerate CPAP - weight loss recommended.   4. DM2  - Continue Jardiance 25 - continue statin and Entresto  5. PVCs - ICD interrogation with 1.3% burden 2/22  Daniel Bensimhon, MD  10:13 AM    . 

## 2020-03-31 ENCOUNTER — Encounter (HOSPITAL_COMMUNITY): Payer: Self-pay | Admitting: Internal Medicine

## 2020-03-31 ENCOUNTER — Other Ambulatory Visit: Payer: Self-pay

## 2020-03-31 ENCOUNTER — Ambulatory Visit (HOSPITAL_COMMUNITY)
Admission: RE | Admit: 2020-03-31 | Discharge: 2020-03-31 | Disposition: A | Discharge status: Home / Self Care | Payer: HMO | Source: Ambulatory Visit | Attending: Internal Medicine | Admitting: Internal Medicine

## 2020-03-31 VITALS — BP 104/70 | HR 85 | Wt 292.2 lb

## 2020-03-31 DIAGNOSIS — I5022 Chronic systolic (congestive) heart failure: Secondary | ICD-10-CM | POA: Diagnosis not present

## 2020-03-31 DIAGNOSIS — I2510 Atherosclerotic heart disease of native coronary artery without angina pectoris: Secondary | ICD-10-CM | POA: Diagnosis not present

## 2020-03-31 DIAGNOSIS — Z8249 Family history of ischemic heart disease and other diseases of the circulatory system: Secondary | ICD-10-CM | POA: Insufficient documentation

## 2020-03-31 DIAGNOSIS — Z95810 Presence of automatic (implantable) cardiac defibrillator: Secondary | ICD-10-CM

## 2020-03-31 DIAGNOSIS — I480 Paroxysmal atrial fibrillation: Secondary | ICD-10-CM | POA: Insufficient documentation

## 2020-03-31 DIAGNOSIS — G4733 Obstructive sleep apnea (adult) (pediatric): Secondary | ICD-10-CM | POA: Diagnosis not present

## 2020-03-31 DIAGNOSIS — E119 Type 2 diabetes mellitus without complications: Secondary | ICD-10-CM | POA: Insufficient documentation

## 2020-03-31 DIAGNOSIS — Z7901 Long term (current) use of anticoagulants: Secondary | ICD-10-CM | POA: Insufficient documentation

## 2020-03-31 DIAGNOSIS — I110 Hypertensive heart disease with heart failure: Secondary | ICD-10-CM | POA: Insufficient documentation

## 2020-03-31 DIAGNOSIS — I428 Other cardiomyopathies: Secondary | ICD-10-CM | POA: Insufficient documentation

## 2020-03-31 DIAGNOSIS — Z7984 Long term (current) use of oral hypoglycemic drugs: Secondary | ICD-10-CM | POA: Diagnosis not present

## 2020-03-31 DIAGNOSIS — I493 Ventricular premature depolarization: Secondary | ICD-10-CM | POA: Insufficient documentation

## 2020-03-31 DIAGNOSIS — I4891 Unspecified atrial fibrillation: Secondary | ICD-10-CM | POA: Diagnosis not present

## 2020-03-31 DIAGNOSIS — Z79899 Other long term (current) drug therapy: Secondary | ICD-10-CM | POA: Insufficient documentation

## 2020-03-31 LAB — BASIC METABOLIC PANEL
Anion gap: 10 (ref 5–15)
BUN: 15 mg/dL (ref 6–20)
CO2: 24 mmol/L (ref 22–32)
Calcium: 9.1 mg/dL (ref 8.9–10.3)
Chloride: 105 mmol/L (ref 98–111)
Creatinine, Ser: 0.73 mg/dL (ref 0.61–1.24)
GFR, Estimated: >60 mL/min (ref >60)
Glucose, Bld: 163 mg/dL — ABNORMAL HIGH (ref 70–99)
Potassium: 4.6 mmol/L (ref 3.5–5.1)
Sodium: 139 mmol/L (ref 135–145)

## 2020-03-31 LAB — BRAIN NATRIURETIC PEPTIDE: B Natriuretic Peptide: 22.9 pg/mL (ref 0.0–100.0)

## 2020-03-31 MED ORDER — POTASSIUM CHLORIDE CRYS ER 20 MEQ PO TBCR: 20.0000 meq | EXTENDED_RELEASE_TABLET | ORAL | 11 refills | Status: DC

## 2020-03-31 MED ORDER — FUROSEMIDE 40 MG PO TABS: 40.0000 mg | ORAL_TABLET | ORAL | 11 refills | Status: DC

## 2020-03-31 NOTE — Addendum Note (Signed)
Encounter addended by: Jolaine Artist, MD on: 03/31/2020 10:40 AM  Actions taken: Level of Service modified, Visit diagnoses modified

## 2020-03-31 NOTE — Patient Instructions (Signed)
START Lasix 40 mg, one tab every Friday START Potassium 20 meq one tab every Friday  Your physician has requested that you have an echocardiogram. Echocardiography is a painless test that uses sound waves to create images of your heart. It provides your doctor with information about the size and shape of your heart and how well your heart's chambers and valves are working. This procedure takes approximately one hour. There are no restrictions for this procedure.  Labs today We will only contact you if something comes back abnormal or we need to make some changes. Otherwise no news is good news!  Your physician recommends that you schedule a follow-up appointment in: 3-4 months with Dr Haroldine Laws  Do the following things EVERYDAY: 1) Weigh yourself in the morning before breakfast. Write it down and keep it in a log. 2) Take your medicines as prescribed 3) Eat low salt foods--Limit salt (sodium) to 2000 mg per day.  4) Stay as active as you can everyday 5) Limit all fluids for the day to less than 2 liters  At the [facility], you and your health needs are our priority. As part of our continuing mission to provide you with exceptional heart care, we have created designated Provider Care Teams. These Care Teams include your primary Cardiologist (physician) and Advanced Practice Providers (APPs- Physician Assistants and Nurse Practitioners) who all work together to provide you with the care you need, when you need it.   You may see any of the following providers on your designated Care Team at your next follow up: [name] Dr Glori Bickers . Dr Loralie Champagne . Dr Vickki Muff . Darrick Grinder, NP . Lyda Jester, [place] . Audry Riles, PharmD   Please be sure to bring in all your medications bottles to every appointment.   If you have any questions or concerns before your next appointment please send [place] a message through [place] or call our office at  [phone].    TO LEAVE A MESSAGE FOR THE NURSE SELECT OPTION 2, PLEASE LEAVE A MESSAGE INCLUDING: . YOUR NAME . DATE OF BIRTH . CALL BACK NUMBER . REASON FOR CALL**this is important as we prioritize the call backs  YOU WILL RECEIVE A CALL BACK THE SAME DAY AS LONG AS YOU CALL BEFORE 4:00 PM

## 2020-04-02 ENCOUNTER — Encounter: Payer: Self-pay | Admitting: Family Medicine

## 2020-04-02 ENCOUNTER — Ambulatory Visit (INDEPENDENT_AMBULATORY_CARE_PROVIDER_SITE_OTHER): Payer: HMO | Admitting: Family Medicine

## 2020-04-02 ENCOUNTER — Other Ambulatory Visit: Payer: Self-pay

## 2020-04-02 VITALS — BP 112/74 | HR 77 | Temp 97.1°F | Ht 75.5 in | Wt 293.1 lb

## 2020-04-02 DIAGNOSIS — M542 Cervicalgia: Secondary | ICD-10-CM | POA: Diagnosis not present

## 2020-04-02 DIAGNOSIS — H9209 Otalgia, unspecified ear: Secondary | ICD-10-CM | POA: Insufficient documentation

## 2020-04-02 DIAGNOSIS — H9202 Otalgia, left ear: Secondary | ICD-10-CM | POA: Diagnosis not present

## 2020-04-02 MED ORDER — FLUTICASONE PROPIONATE 50 MCG/ACT NA SUSP: 2.0000 | Freq: Every day | NASAL | 6 refills | Status: DC

## 2020-04-02 NOTE — Assessment & Plan Note (Signed)
This may be referred from his neck, or from ETD that is intermittent Adv to start flonase daily  Continue claritin   Will treat neck strain with heat/analgesic/stretches/topical voltaren and update  If worse or hearing change or new symptoms inst to call

## 2020-04-02 NOTE — Patient Instructions (Addendum)
Use flonase nasal spray daily through allergy symptoms  This will help chronic congestion and keep ear tubes open  If ear pain does not improve let me know   For neck - use heat  Try some gentle stretching (as tolerated)  Look for a memory foam pillow   voltaren gel  1% over the counter may help-on affected area up to four times daily   Update if not starting to improve in a week or if worsening

## 2020-04-02 NOTE — Progress Notes (Signed)
Subjective:    Patient ID: Aaron Thompson, male    DOB: 02/25/1960, 60 y.o.   MRN: [id number]  This visit occurred during the SARS-CoV-2 public health emergency.  Safety protocols were in place, including screening questions prior to the visit, additional usage of staff PPE, and extensive cleaning of exam room while observing appropriate contact time as indicated for disinfecting solutions.    HPI Pt presents with neck and ear discomfort  Wt Readings from Last 3 Encounters:  04/02/20 293 lb 1 oz (132.9 kg)  03/31/20 292 lb 3.2 oz (132.5 kg)  02/28/20 289 lb (131.1 kg)   36.15 kg/m  Started with a crick in his neck left side 3 weeks ago , throbbing in nature  Worse yesterday and a little better today (heating pad helped 20 min at a time) Massages it- tender to the touch Worse to flex and rotate right   No radicular pain or numbness (had that in the past with surgery)  Now L ear pain  Some popping  Some trouble hearing   Takes claritin for allergies (occ congested)  Has not used nasal steroid spray recently   History of cervical plate/fusion in the past   He takes tramadol and tylenol (scheduled) every am  Last xray DG Cervical Spine Complete (Accession [id number]) (Order [id number]) Imaging Date: 07/07/2016 Department: [place] Released By: Marchia Bond Authorizing: Owens Loffler, MD    Exam Status  Status  Final [99]   PACS Intelerad Image Link  Show images for DG Cervical Spine Complete  Study Result  Narrative & Impression  CLINICAL DATA:  Neck pain and left radiculopathy.  EXAM: CERVICAL SPINE - COMPLETE 4+ VIEW  COMPARISON:  None.  FINDINGS: There is no evidence of cervical spine fracture or prevertebral soft tissue swelling. There is straightening of cervical lordosis. Prior C5 through C7 anterior fusion. Possible fracture of C7 screws, only seen on 1 technically suboptimal view. Lucency surrounds the  C5 screws. Multilevel osteoarthritic changes of the cervical spine. Moderate posterior facet arthropathy.  IMPRESSION: No evidence of acute fracture.  Straightening of the cervical lordosis.  Prior C5 through C7 anterior spinal fusion. Possible fracture of C7 screws, inconclusive, as seen on only 1 suboptimal lateral view.  Lucency around C5 screws, which may be associated with hardware loosening.  Multilevel osteoarthritic changes of the cervical spine.   Electronically Signed   By: Fidela Salisbury M.D.   On: 07/07/2016 15:48    followup CT: IMPRESSION: 1. Solid anterior fusion at C5-6 and C6-7 without evidence of hardware failure. The lucency on the radiographs was artifactual. 2. Mild residual left foraminal narrowing at C6-7 due to uncovertebral spurring. 3. Adjacent level disease at C7-T1 with advanced facet hypertrophy resulting in moderate foraminal narrowing bilaterally, left greater than right. 4. Moderate left and mild right foraminal narrowing at C4-5 secondary to asymmetric left-sided facet hypertrophy and uncovertebral spurring. 5. Moderate left central and severe left foraminal stenosis at C3-4 secondary to a leftward disc osteophyte complex and advanced left-sided facet hypertrophy. 6. Mild left foraminal narrowing at C2-3 secondary to uncovertebral and facet disease.  Patient Active Problem List   Diagnosis Date Noted  . Neck pain 04/02/2020  . Ear pain 04/02/2020  . ICD (implantable cardioverter-defibrillator) in place 02/04/2020  . Shoulder pain, bilateral 12/01/2018  . Low back pain 09/07/2016  . Family history of prostate cancer 06/23/2016  . Advance care planning 06/23/2016  . Colon polyps 06/23/2016  . Healthcare maintenance  03/08/2016  . A-fib ([place]) 10/14/2015  . Inappropriate shocks from ICD (implantable cardioverter-defibrillator) 03/17/2015  . Obesity (BMI 30-39.9) 12/05/2014  . ICD (implantable cardioverter-defibrillator)  discharge 04/19/2014  . Chronic systolic heart failure ([place]) 02/14/2013  . Hyperlipidemia 02/05/2013  . Uncontrolled type 2 diabetes mellitus with circulatory disorder, without long-term current use of insulin ([place]) 09/30/2011  . Nonischemic cardiomyopathy ([place]) 08/18/2011  . Chest pain on exertion 08/17/2011  . Other restrictive cardiomyopathy ([place]) 07/16/2009  . OSTEOARTHROSIS UNSPEC WHETHER GEN/LOCALIZED HAND 08/16/2008  . Essential hypertension 05/11/2007  . History of renal calculi 05/11/2007  . Fatty liver 03/20/2007  . ANXIETY 10/04/2006  . HEARING IMPAIRMENT 10/04/2006  . GERD 09/20/2006   Past Medical History:  Diagnosis Date  . AICD (automatic cardioverter/defibrillator) present   . Anxiety state, unspecified   . Calculus of kidney 1981   "passed it on my own"  . Chronic systolic CHF (congestive heart failure) ([place])   . Dermatophytosis of the body   . DJD (degenerative joint disease)   . Esophageal reflux   . Hx of colonic polyps   . Hypertension   . LBBB (left bundle branch block)    intermittent  . Non-ischemic cardiomyopathy ([place])    a. cath 2013: minor nonobstructive CAD.  [name] OSA (obstructive sleep apnea)    he can't tolerate CPAP.   [name] Osteoarthrosis, unspecified whether generalized or localized, hand   . Other chronic nonalcoholic liver disease    fatty liver  . PAF (paroxysmal atrial fibrillation) ([place])    a. s/p DCCV 6/11; previously on Pradaxa;  b. Event Monitor 2012->No PAF;  c. 09/2011 s/p DCCV ->Xarelto started. d. 03/2014: inappropriate ICD shocks for AF-RVR, started on Tikosyn, Xarelto restarted.  . Type II diabetes mellitus ([place])   . Unspecified hearing loss    no hearing aid   Past Surgical History:  Procedure Laterality Date  . Abdominal US  01/2007   Fatty liver, no gallstones  . APPENDECTOMY    . ATRIAL TACH ABLATION  09/2015  . BI-VENTRICULAR IMPLANTABLE CARDIOVERTER DEFIBRILLATOR [name] 02/14/2013   STJ CRTD implanted by Dr Caryl Comes  . BILATERAL  KNEE ARTHROSCOPY    . BIV ICD GENERATOR CHANGEOUT [name] 06/06/2019   Procedure: BIV ICD GENERATOR CHANGEOUT;  Surgeon: Deboraha Sprang, MD;  Location: [facility];  Service: Cardiovascular;  Laterality: [name];  . CARDIOVERSION  09/29/2011   Procedure: CARDIOVERSION;  Surgeon: Thayer Headings, MD;  Location: [facility];  Service: Cardiovascular;  Laterality: [name];  . COLONOSCOPY WITH PROPOFOL [name] 10/26/2016   Procedure: COLONOSCOPY WITH PROPOFOL;  Surgeon: Irene Shipper, MD;  Location: [facility];  Service: Endoscopy;  Laterality: [name];  . DOPPLER ECHOCARDIOGRAPHY  11/2009   Decreased EF of 35-40%  . ELECTROPHYSIOLOGIC STUDY [name] 10/14/2015   Procedure: Atrial Fibrillation Ablation;  Surgeon: [name], MD;  Location: [facility];  Service: Cardiovascular;  Laterality: [name];  . LEFT HEART CATHETERIZATION WITH CORONARY ANGIOGRAM [name] 08/18/2011   Procedure: LEFT HEART CATHETERIZATION WITH CORONARY ANGIOGRAM;  Surgeon: Peter M Martinique, MD;  [facility]: [facility];  Service: Cardiovascular;  Laterality: [name];  . NECK SURGERY     plate/fusion  . Stress Cardiolite  08/2005   Normal, EF 42%  . UPPER GASTROINTESTINAL ENDOSCOPY  08/2006   GERD   Social History   Tobacco Use  . Smoking status: Never Smoker  . Smokeless tobacco: Former Systems developer    Types: Snuff  . Tobacco comment: 02/14/2013 "quit snuff in 2006"  Vaping  Use  . Vaping Use: Never used  Substance Use Topics  . Alcohol use: No    Alcohol/week: 0.0 standard drinks  . Drug use: No   Family History  Problem Relation Age of Onset  . Hypertension Mother   . Diabetes Mother   . Heart failure Mother        Died @ 33  . Hypertension Father   . Heart failure Father        Died @ 61  . Prostate cancer Father   . Diabetes Brother   . Kidney disease Brother   . Diabetes Brother   . Prostate cancer Brother   . Diabetes Brother   . Other Sister   . Heart attack Paternal Grandfather   . Stroke Paternal Grandmother   . Colon cancer  Paternal Grandmother   . Multiple myeloma Sister   . Leukemia Sister   . Colon polyps Sister   . Cirrhosis Maternal Uncle        alcohol related  . Esophageal cancer Neg Hx   . Stomach cancer Neg Hx   . Pancreatic cancer Neg Hx    Allergies  Allergen Reactions  . Atorvastatin     Muscle pain    Current Outpatient Medications on File Prior to Visit  Medication Sig Dispense Refill  . acetaminophen (TYLENOL) 325 MG tablet Take 650 mg by mouth every 6 (six) hours as needed for mild pain or moderate pain.    [name] ALPRAZolam (XANAX) 0.25 MG tablet TAKE 1 TABLET BY MOUTH AS NEEDED FOR ANXIETY 30 tablet 1  . Blood Glucose Monitoring Suppl (ONE TOUCH ULTRA 2) w/Device KIT Use daily to check sugars as needed. Dx E11.9 1 kit 0  . carvedilol (COREG) 25 MG tablet TAKE 2 TABLETS(50 MG) BY MOUTH TWICE DAILY WITH A MEAL 360 tablet 2  . dofetilide (TIKOSYN) 500 MCG capsule Take 1 capsule (500 mcg total) by mouth 2 (two) times daily. 180 capsule 2  . empagliflozin (JARDIANCE) 25 MG TABS tablet Take 1 tablet (25 mg total) by mouth daily. 90 tablet 1  . [START ON 04/04/2020] furosemide (LASIX) 40 MG tablet Take 1 tablet (40 mg total) by mouth every Friday. 5 tablet 11  . glipiZIDE (GLUCOTROL XL) 10 MG 24 hr tablet TAKE 1 TABLET(10 MG) BY MOUTH DAILY WITH BREAKFAST 30 tablet 11  . glucose blood (ONE TOUCH ULTRA TEST) test strip CHECK GLUCOSE TWICE DAILY AND AS NEEDED, DX. E11.65 (UNCONTROLLED DM) 100 each 5  . loratadine (CLARITIN) 10 MG tablet Take 10 mg by mouth daily as needed for allergies.    . Magnesium Oxide 400 MG CAPS Take 1 capsule (400 mg total) by mouth daily. 90 capsule 3  . metFORMIN (GLUCOPHAGE-XR) 500 MG 24 hr tablet TAKE 3 TABLETS(1500 MG) BY MOUTH DAILY WITH BREAKFAST 90 tablet 5  . Multiple Vitamin (MULTIVITAMIN) tablet Take 1 tablet by mouth daily.    [name] Bicarbonate (ZEGERID) 20-1100 MG CAPS capsule Take 1 capsule by mouth 2 (two) times daily.    . Pitavastatin Calcium  (LIVALO) 1 MG TABS 1 tablet daily if tolerated.  If muscle aches develop, cut back to every other day. 90 tablet 0  . potassium chloride SA (KLOR-CON) 20 MEQ tablet Take 1 tablet (20 mEq total) by mouth once a week. 5 tablet 11  . sacubitril-valsartan (ENTRESTO) 97-103 MG Take 1 tablet by mouth 2 (two) times daily. Pt need to keep upcoming appt in June for further refills 180 tablet 2  .  spironolactone (ALDACTONE) 25 MG tablet TAKE 1 TABLET(25 MG) BY MOUTH DAILY 90 tablet 2  . traMADol (ULTRAM) 50 MG tablet TAKE 1 TABLET(50 MG) BY MOUTH EVERY 12 HOURS AS NEEDED FOR PAIN 60 tablet 2  . XARELTO 20 MG TABS tablet TAKE 1 TABLET(20 MG) BY MOUTH DAILY WITH SUPPER 30 tablet 5   No current facility-administered medications on file prior to visit.    Review of Systems  Constitutional: Negative for activity change, appetite change, fatigue, fever and unexpected weight change.  HENT: Positive for ear pain. Negative for congestion, ear discharge, rhinorrhea, sore throat and trouble swallowing.   Eyes: Negative for pain, redness, itching and visual disturbance.  Respiratory: Negative for cough, chest tightness, shortness of breath and wheezing.   Cardiovascular: Negative for chest pain and palpitations.  Gastrointestinal: Negative for abdominal pain, blood in stool, constipation, diarrhea and nausea.  Endocrine: Negative for cold intolerance, heat intolerance, polydipsia and polyuria.  Genitourinary: Negative for difficulty urinating, dysuria, frequency and urgency.  Musculoskeletal: Positive for neck pain. Negative for arthralgias, joint swelling, myalgias and neck stiffness.  Skin: Negative for pallor and rash.  Neurological: Negative for dizziness, tremors, weakness, numbness and headaches.  Hematological: Negative for adenopathy. Does not bruise/bleed easily.  Psychiatric/Behavioral: Negative for decreased concentration and dysphoric mood. The patient is not nervous/anxious.        Objective:    Physical Exam Constitutional:      General: He is not in acute distress.    Appearance: Normal appearance. He is obese. He is not ill-appearing.  HENT:     Right Ear: Tympanic membrane, ear canal and external ear normal. There is no impacted cerumen.     Left Ear: Tympanic membrane, ear canal and external ear normal. There is no impacted cerumen.     Mouth/Throat:     Mouth: Mucous membranes are moist.  Eyes:     General: No scleral icterus.    Conjunctiva/sclera: Conjunctivae normal.     Pupils: Pupils are equal, round, and reactive to light.  Neck:     Vascular: No carotid bruit.  Cardiovascular:     Rate and Rhythm: Normal rate.     Heart sounds: Normal heart sounds.  Pulmonary:     Effort: Pulmonary effort is normal. No respiratory distress.     Breath sounds: Normal breath sounds. No wheezing or rales.  Musculoskeletal:        General: No swelling.     Cervical back: Normal range of motion and neck supple. Spasms, tenderness and crepitus present. No swelling, edema, deformity, erythema, rigidity, torticollis or bony tenderness. Pain with movement present. Normal range of motion.     Right lower leg: No edema.     Left lower leg: No edema.     Comments: CS Some pain with R rotation and R tilt Limited ext due to fusion  No bony tenderness L muscular tightness/tenderness noted  No radicular s/s  Lymphadenopathy:     Cervical: No cervical adenopathy.  Skin:    Coloration: Skin is not pale.     Findings: No erythema or rash.  Neurological:     Mental Status: He is alert.     Cranial Nerves: No cranial nerve deficit.     Sensory: No sensory deficit.     Motor: No weakness.     Coordination: Coordination normal.     Deep Tendon Reflexes: Reflexes normal.  Psychiatric:        Mood and Affect: Mood normal.  Assessment & Plan:   Problem List Items Addressed This Visit      Other   Neck pain - Primary    In pt with h/o degenerative cervical /disc dz and  past fusion  Rev last imaging in epic  No radicular symptoms now  Exam consistent with muscle spasm  Disc finding better pillow/foam, trying voltaren gel prn, continued heat and handout with exercises (caution in terms of fusion)  inst if worse or if any radicular symptoms to call  Also if not staring to improve in a week        Ear pain    This may be referred from his neck, or from ETD that is intermittent Adv to start flonase daily  Continue claritin   Will treat neck strain with heat/analgesic/stretches/topical voltaren and update  If worse or hearing change or new symptoms inst to call

## 2020-04-02 NOTE — Assessment & Plan Note (Signed)
In pt with h/o degenerative cervical /disc dz and past fusion  Rev last imaging in epic  No radicular symptoms now  Exam consistent with muscle spasm  Disc finding better pillow/foam, trying voltaren gel prn, continued heat and handout with exercises (caution in terms of fusion)  inst if worse or if any radicular symptoms to call  Also if not staring to improve in a week

## 2020-04-07 DIAGNOSIS — E119 Type 2 diabetes mellitus without complications: Secondary | ICD-10-CM | POA: Diagnosis not present

## 2020-04-07 LAB — HM DIABETES EYE EXAM

## 2020-04-14 DIAGNOSIS — M47812 Spondylosis without myelopathy or radiculopathy, cervical region: Secondary | ICD-10-CM | POA: Diagnosis not present

## 2020-04-15 ENCOUNTER — Other Ambulatory Visit: Payer: Self-pay | Admitting: Family Medicine

## 2020-04-15 NOTE — Progress Notes (Signed)
2   Primary Cardiologist: Dr. [name]  HPI:  Aaron Thompson a 60 y.o.malewith PAF s/p AF ablation , HTN, morbid obesity, OSA unable to tolerate CPAP, DM, LBBB and chronic systolic HF (EF 40-10%) due to NICM s/p STJ CRT-D (cath 2013 minimal CAD)     Diagnosed with systolic HF in 2725. Cath at that time minimal CAD. Had STJ CRT-D placed.   Presented to ER with new-onset AF 7/17.Had DCCV in ER. Admitted the same month with ERAF. Felt to to have both AFib and ATach. Subsequently seen by Dr. Rayann Heman and had afib ablation in 9/17. Subsequently had recurrent AF with RVR leading to ICD shocks. Placed back on tikosyn and anticoagulation.   Echo 12/14/16 EF 20-25% Last echo 1/20 EF 25-30%   CRT-D gen change 5/21  Saw Dr. Caryl Comes in 1/22 and being evaluated for symptomatic PVCs. On ICD interrogation in 2/22 burden was 1.3%  Playing golf and walking without issues Compliant with meds  Vaccinated for COVID  More active with less fatigue     Past Medical History:  Diagnosis Date  . AICD (automatic cardioverter/defibrillator) present   . Anxiety state, unspecified   . Calculus of kidney 1981   "passed it on my own"  . Chronic systolic CHF (congestive heart failure) ([place])   . Dermatophytosis of the body   . DJD (degenerative joint disease)   . Esophageal reflux   . Hx of colonic polyps   . Hypertension   . LBBB (left bundle branch block)    intermittent  . Non-ischemic cardiomyopathy ([place])    a. cath 2013: minor nonobstructive CAD.  [name] OSA (obstructive sleep apnea)    he can't tolerate CPAP.   [name] Osteoarthrosis, unspecified whether generalized or localized, hand   . Other chronic nonalcoholic liver disease    fatty liver  . PAF (paroxysmal atrial fibrillation) ([place])    a. s/p DCCV 6/11; previously on Pradaxa;  b. Event Monitor 2012->No PAF;  c. 09/2011 s/p DCCV ->Xarelto started. d. 03/2014: inappropriate ICD shocks for AF-RVR, started on Tikosyn, Xarelto restarted.  . Type II  diabetes mellitus ([place])   . Unspecified hearing loss    no hearing aid    Current Outpatient Medications  Medication Sig Dispense Refill  . acetaminophen (TYLENOL) 325 MG tablet Take 650 mg by mouth every 6 (six) hours as needed for mild pain or moderate pain.    [name] ALPRAZolam (XANAX) 0.25 MG tablet TAKE 1 TABLET BY MOUTH AS NEEDED FOR ANXIETY 30 tablet 1  . Blood Glucose Monitoring Suppl (ONE TOUCH ULTRA 2) w/Device KIT Use daily to check sugars as needed. Dx E11.9 1 kit 0  . carvedilol (COREG) 25 MG tablet TAKE 2 TABLETS(50 MG) BY MOUTH TWICE DAILY WITH A MEAL 360 tablet 2  . dofetilide (TIKOSYN) 500 MCG capsule Take 1 capsule (500 mcg total) by mouth 2 (two) times daily. 180 capsule 2  . empagliflozin (JARDIANCE) 25 MG TABS tablet Take 1 tablet (25 mg total) by mouth daily. 90 tablet 1  . fluticasone (FLONASE) 50 MCG/ACT nasal spray Place 2 sprays into both nostrils daily. 16 g 6  . furosemide (LASIX) 40 MG tablet Take 1 tablet (40 mg total) by mouth every Friday. 5 tablet 11  . glipiZIDE (GLUCOTROL XL) 10 MG 24 hr tablet TAKE 1 TABLET(10 MG) BY MOUTH DAILY WITH BREAKFAST 30 tablet 11  . glucose blood (ONE TOUCH ULTRA TEST) test strip CHECK GLUCOSE TWICE DAILY AND AS NEEDED, DX. E11.65 (UNCONTROLLED DM)  100 each 5  . loratadine (CLARITIN) 10 MG tablet Take 10 mg by mouth daily as needed for allergies.    . Magnesium Oxide 400 MG CAPS Take 1 capsule (400 mg total) by mouth daily. 90 capsule 3  . metFORMIN (GLUCOPHAGE-XR) 500 MG 24 hr tablet TAKE 3 TABLETS(1500 MG) BY MOUTH DAILY WITH BREAKFAST 90 tablet 5  . Multiple Vitamin (MULTIVITAMIN) tablet Take 1 tablet by mouth daily.    [name] Bicarbonate (ZEGERID) 20-1100 MG CAPS capsule Take 1 capsule by mouth 2 (two) times daily.    . Pitavastatin Calcium (LIVALO) 1 MG TABS 1 tablet daily if tolerated.  If muscle aches develop, cut back to every other day. 90 tablet 0  . potassium chloride SA (KLOR-CON) 20 MEQ tablet Take 1 tablet (20  mEq total) by mouth once a week. 5 tablet 11  . sacubitril-valsartan (ENTRESTO) 97-103 MG Take 1 tablet by mouth 2 (two) times daily. Pt need to keep upcoming appt in June for further refills 180 tablet 2  . spironolactone (ALDACTONE) 25 MG tablet TAKE 1 TABLET(25 MG) BY MOUTH DAILY 90 tablet 0  . traMADol (ULTRAM) 50 MG tablet TAKE 1 TABLET(50 MG) BY MOUTH EVERY 12 HOURS AS NEEDED FOR PAIN 60 tablet 2  . XARELTO 20 MG TABS tablet TAKE 1 TABLET(20 MG) BY MOUTH DAILY WITH SUPPER 30 tablet 5   No current facility-administered medications for this visit.    Allergies  Allergen Reactions  . Atorvastatin     Muscle pain       Social History   Socioeconomic History  . Marital status: Married    Spouse name: Not on file  . Number of children: 2  . Years of education: Not on file  . Highest education level: Not on file  Occupational History  . Occupation: Banker: UNEMPLOYED  Tobacco Use  . Smoking status: Never Smoker  . Smokeless tobacco: Former Systems developer    Types: Snuff  . Tobacco comment: 02/14/2013 "quit snuff in 2006"  Vaping Use  . Vaping Use: Never used  Substance and Sexual Activity  . Alcohol use: No    Alcohol/week: 0.0 standard drinks  . Drug use: No  . Sexual activity: Yes  Other Topics Concern  . Not on file  Social History Narrative   Lives in [place] with wife.  Works @ [employer] in [employer].   Married 1990   Social Determinants of Health   Financial Resource Strain: Low Risk   . Difficulty of Paying Living Expenses: Not very hard  Food Insecurity: No Food Insecurity  . Worried About Charity fundraiser in the Last Year: Never true  . Ran Out of Food in the Last Year: Never true  Transportation Needs: No Transportation Needs  . Lack of Transportation (Medical): No  . Lack of Transportation (Non-Medical): No  Physical Activity: Inactive  . Days of Exercise per Week: 0 days  . Minutes of Exercise per Session: 0 min  Stress: No Stress  Concern Present  . Feeling of Stress : Not at all  Social Connections: Not on file  Intimate Partner Violence: Not At Risk  . Fear of Current or Ex-Partner: No  . Emotionally Abused: No  . Physically Abused: No  . Sexually Abused: No      Family History  Problem Relation Age of Onset  . Hypertension Mother   . Diabetes Mother   . Heart failure Mother  Died @ 75  . Hypertension Father   . Heart failure Father        Died @ 15  . Prostate cancer Father   . Diabetes Brother   . Kidney disease Brother   . Diabetes Brother   . Prostate cancer Brother   . Diabetes Brother   . Other Sister   . Heart attack Paternal Grandfather   . Stroke Paternal Grandmother   . Colon cancer Paternal Grandmother   . Multiple myeloma Sister   . Leukemia Sister   . Colon polyps Sister   . Cirrhosis Maternal Uncle        alcohol related  . Esophageal cancer Neg Hx   . Stomach cancer Neg Hx   . Pancreatic cancer Neg Hx     Vitals:   04/25/20 0842  BP: 110/72  Pulse: 83  SpO2: 98%  Weight: 130.6 kg  Height: '6\' 4"'  (1.93 m)    PHYSICAL EXAM: Affect appropriate Overweight white male  HEENT: post cervical fusion l Neck supple with no adenopathy JVP normal no bruits no thyromegaly Lungs clear with no wheezing and good diaphragmatic motion Heart:  S1/S2 no murmur, no rub, gallop or click PMI enlarged AICD under left clavicle  Abdomen: benighn, BS positve, no tenderness, no AAA no bruit.  No HSM or HJR Distal pulses intact with no bruits No edema Neuro non-focal Skin warm and dry No muscular weakness   ECG: NSR with v-pacing 88 Personally reviewed  ASSESSMENT & PLAN: 1. Chronic systolic failure due to NICM s/p STJ CRT-D - NYHA class 2 continue entresto, coreg, aldactone, jardiance and  Lasix BNP 23 Cr 0.73 K 4.6 03/31/20      2. PAF -post ablation on tikosyn and xarelto f/u Dr Caryl Comes   3. OSA - intolerant to CPAP Discussed weight loss Discussed Inspiris device  And  possible benefit of ENT referral   4. DM2  - Continue Jardiance , glipizide and metformin   5. PVCs - ICD interrogation with 1.3% burden 2/22  F/U CHF clinic 3 months F/U me 6 months   Jenkins Rouge, MD  8:49 AM    .

## 2020-04-18 ENCOUNTER — Other Ambulatory Visit (HOSPITAL_COMMUNITY): Payer: Self-pay | Admitting: *Deleted

## 2020-04-18 DIAGNOSIS — I5022 Chronic systolic (congestive) heart failure: Secondary | ICD-10-CM

## 2020-04-18 NOTE — Chronic Care Management (AMB) (Addendum)
° °  Contacted Mr. Clendenning to update BG log.   Recent Relevant Labs: Lab Results  Component Value Date/Time   HGBA1C 7.8 (A) 02/28/2020 11:34 AM   HGBA1C 7.5 (H) 06/21/2019 09:45 AM   HGBA1C 7.6 (H) 04/06/2019 11:52 AM   MICROALBUR 1.7 10/09/2010 11:36 AM    Kidney Function Lab Results  Component Value Date/Time   CREATININE 0.73 03/31/2020 10:30 AM   CREATININE 0.76 09/10/2019 09:58 AM   CREATININE 0.77 10/01/2015 12:02 PM   CREATININE 0.91 08/28/2015 09:09 AM   GFR 108.76 06/21/2019 09:45 AM   GFRNONAA >60 03/31/2020 10:30 AM   GFRAA 117 09/10/2019 09:58 AM     Current antihyperglycemic regimen:  Jardiance 25 mg- 1 tablet daily (increased 02/28/20 by Dr. [name]) Metformin ER 500 mg - 3 tablets with breakfast Glipizide ER 10 mg - 1 tablet daily     Patient verbally confirms he is taking the above medications as directed. Yes  What recent interventions/DTPs have been made to improve glycemic control:  No interventions from providers.  Have there been any recent hospitalizations or ED visits since last visit with CPP? No  Patient denies hypoglycemic symptoms, including Pale, Sweaty, Shaky, Hungry, Nervous/irritable and Vision changes  Patient denies hyperglycemic symptoms, including blurry vision, excessive thirst, fatigue, polyuria and weakness  How often are you checking your blood sugar? once daily  What are your blood sugars ranging?  Fasting: 153, 177, 142, 132, 152, 94, 141, 146, 121,140, 143, 128, 135, 136, 103, 126, 136 Before meals: [name] After meals: [name] Bedtime: [name]  On insulin? No  During the week, how often does your blood glucose drop below 70? Never  Are you checking your feet daily/regularly? Yes  Adherence Review: Is the patient currently on a STATIN medication? Yes Is the patient currently on ACE/ARB medication? Yes Does the patient have >5 day gap between last estimated fill dates? CPP to review  [name]:  Medication:  Last Fill: Day  Supply Jardiance 25 mg 02/28/20  90 Glipizide 10 mg 04/03/20 30 Metformin 500 mg 01/06/20 90 pitavastain 1 mg 02/22/20 90 Entreso 97-103 mg 04/05/20 30  States he was able to obtain a new meter and new test strips. He has been working on eating healthier snacks such as celery and carrots and reducing carbs. He has tried increasing exercise such as walking more. States he has lost some weight.    Follow-Up:  Pharmacist Review  [name], CPP notified  [name], [place] Assistant [phone]  I have reviewed the care management and care coordination activities outlined in this encounter and I am certifying that I agree with the content of this note. No further action required.  [name], PharmD Clinical Pharmacist [facility] at [facility] [phone]

## 2020-04-19 ENCOUNTER — Other Ambulatory Visit: Payer: Self-pay | Admitting: Cardiovascular Disease

## 2020-04-21 ENCOUNTER — Other Ambulatory Visit: Payer: Self-pay

## 2020-04-21 ENCOUNTER — Ambulatory Visit (HOSPITAL_COMMUNITY)
Admission: RE | Admit: 2020-04-21 | Discharge: 2020-04-21 | Disposition: A | Discharge status: Home / Self Care | Payer: HMO | Source: Ambulatory Visit | Attending: Cardiology | Admitting: Cardiology

## 2020-04-21 DIAGNOSIS — I5022 Chronic systolic (congestive) heart failure: Secondary | ICD-10-CM | POA: Diagnosis not present

## 2020-04-21 LAB — BASIC METABOLIC PANEL
Anion gap: 6 (ref 5–15)
BUN: 18 mg/dL (ref 6–20)
CO2: 28 mmol/L (ref 22–32)
Calcium: 9.2 mg/dL (ref 8.9–10.3)
Chloride: 105 mmol/L (ref 98–111)
Creatinine, Ser: 0.67 mg/dL (ref 0.61–1.24)
GFR, Estimated: >60 mL/min (ref >60)
Glucose, Bld: 117 mg/dL — ABNORMAL HIGH (ref 70–99)
Potassium: 4 mmol/L (ref 3.5–5.1)
Sodium: 139 mmol/L (ref 135–145)

## 2020-04-21 LAB — ECHOCARDIOGRAM COMPLETE
Area-P 1/2: 4.31 cm2
Calc EF: 29.8 %
S' Lateral: 5.1 cm
Single Plane A2C EF: 34.5 %
Single Plane A4C EF: 30.6 %

## 2020-04-21 NOTE — Progress Notes (Signed)
  Echocardiogram 2D Echocardiogram has been performed.  Michiel Cowboy 04/21/2020, 8:24 AM

## 2020-04-25 ENCOUNTER — Other Ambulatory Visit: Payer: Self-pay

## 2020-04-25 ENCOUNTER — Ambulatory Visit (INDEPENDENT_AMBULATORY_CARE_PROVIDER_SITE_OTHER): Payer: HMO | Admitting: Cardiovascular Disease

## 2020-04-25 ENCOUNTER — Encounter: Payer: Self-pay | Admitting: Cardiovascular Disease

## 2020-04-25 VITALS — BP 110/72 | HR 83 | Ht 76.0 in | Wt 288.0 lb

## 2020-04-25 DIAGNOSIS — I5022 Chronic systolic (congestive) heart failure: Secondary | ICD-10-CM

## 2020-04-25 DIAGNOSIS — I480 Paroxysmal atrial fibrillation: Secondary | ICD-10-CM

## 2020-04-25 NOTE — Patient Instructions (Signed)

## 2020-04-30 ENCOUNTER — Telehealth: Payer: Self-pay

## 2020-04-30 NOTE — Telephone Encounter (Signed)
**Note De-Identified Xoe Hoe Obfuscation** I started a Entresto PA through [employer]. Key: [id number]

## 2020-04-30 NOTE — Telephone Encounter (Signed)
**Note De-Identified Aaron Thompson Obfuscation** Letter received from [place] stating that they have approved th pts Entresto PA until 04/30/2021. I have notified [place] of this approval.

## 2020-05-13 ENCOUNTER — Telehealth: Payer: Self-pay

## 2020-05-13 NOTE — Progress Notes (Addendum)
Chronic Care Management Pharmacy Assistant   Name: Aaron Thompson  MRN: [id number] DOB: [birth year]  Reason for Encounter: Reminder for CCM Appointment on 05/15/20  Medications: Outpatient Encounter Medications as of 05/13/2020  Medication Sig   acetaminophen (TYLENOL) 325 MG tablet Take 650 mg by mouth every 6 (six) hours as needed for mild pain or moderate pain.   ALPRAZolam (XANAX) 0.25 MG tablet TAKE 1 TABLET BY MOUTH AS NEEDED FOR ANXIETY   Blood Glucose Monitoring Suppl (ONE TOUCH ULTRA 2) w/Device KIT Use daily to check sugars as needed. Dx E11.9   carvedilol (COREG) 25 MG tablet TAKE 2 TABLETS(50 MG) BY MOUTH TWICE DAILY WITH A MEAL   dofetilide (TIKOSYN) 500 MCG capsule Take 1 capsule (500 mcg total) by mouth 2 (two) times daily.   empagliflozin (JARDIANCE) 25 MG TABS tablet Take 1 tablet (25 mg total) by mouth daily.   fluticasone (FLONASE) 50 MCG/ACT nasal spray Place 2 sprays into both nostrils daily.   furosemide (LASIX) 40 MG tablet Take 1 tablet (40 mg total) by mouth every Friday.   glipiZIDE (GLUCOTROL XL) 10 MG 24 hr tablet TAKE 1 TABLET(10 MG) BY MOUTH DAILY WITH BREAKFAST   glucose blood (ONE TOUCH ULTRA TEST) test strip CHECK GLUCOSE TWICE DAILY AND AS NEEDED, DX. E11.65 (UNCONTROLLED DM)   loratadine (CLARITIN) 10 MG tablet Take 10 mg by mouth daily as needed for allergies.   Magnesium Oxide 400 MG CAPS Take 1 capsule (400 mg total) by mouth daily.   metFORMIN (GLUCOPHAGE-XR) 500 MG 24 hr tablet TAKE 3 TABLETS(1500 MG) BY MOUTH DAILY WITH BREAKFAST   Multiple Vitamin (MULTIVITAMIN) tablet Take 1 tablet by mouth daily.   Omeprazole-Sodium Bicarbonate (ZEGERID) 20-1100 MG CAPS capsule Take 1 capsule by mouth 2 (two) times daily.   Pitavastatin Calcium (LIVALO) 1 MG TABS 1 tablet daily if tolerated.  If muscle aches develop, cut back to every other day.   potassium chloride SA (KLOR-CON) 20 MEQ tablet Take 1 tablet (20 mEq total) by mouth once a week.    sacubitril-valsartan (ENTRESTO) 97-103 MG Take 1 tablet by mouth 2 (two) times daily. Pt need to keep upcoming appt in June for further refills   spironolactone (ALDACTONE) 25 MG tablet TAKE 1 TABLET(25 MG) BY MOUTH DAILY   traMADol (ULTRAM) 50 MG tablet TAKE 1 TABLET(50 MG) BY MOUTH EVERY 12 HOURS AS NEEDED FOR PAIN   XARELTO 20 MG TABS tablet TAKE 1 TABLET(20 MG) BY MOUTH DAILY WITH SUPPER   No facility-administered encounter medications on file as of 05/13/2020.    Aaron Thompson was contacted to remind him of his upcoming telephone visit with [name] on 05/15/20 at 8:30.  he was reminded to have all medications, supplements and any blood glucose and blood pressure readings available for review at appointment.  Are you having any problems with your medications? States [name] is about $151 for 30 day supply.   What concerns would you like to discuss with the pharmacist? Would like to discuss patient assistance application for Entresto if it is appropriate.   [name]: Medication:  Last Fill: Day Supply Jardiance 25 mg 02/28/20  90 Glipizide 10 mg 05/04/20 30 Metformin 500 mg 04/15/20 90 Pitavastatin 1 mg 02/22/20 90 Entresto 97-103 mg 05/06/20 [name], CPP notified  [name], [place] Assistant [phone]  I have reviewed the care management and care coordination activities outlined in this encounter and I am certifying that I agree with  the content of this note. No further action required.  [name], PharmD Clinical Pharmacist [facility] at [facility] [phone]

## 2020-05-15 ENCOUNTER — Ambulatory Visit (INDEPENDENT_AMBULATORY_CARE_PROVIDER_SITE_OTHER): Payer: HMO

## 2020-05-15 ENCOUNTER — Telehealth: Payer: Self-pay

## 2020-05-15 ENCOUNTER — Other Ambulatory Visit: Payer: Self-pay

## 2020-05-15 DIAGNOSIS — E1165 Type 2 diabetes mellitus with hyperglycemia: Secondary | ICD-10-CM

## 2020-05-15 DIAGNOSIS — I10 Essential (primary) hypertension: Secondary | ICD-10-CM | POA: Diagnosis not present

## 2020-05-15 DIAGNOSIS — E1159 Type 2 diabetes mellitus with other circulatory complications: Secondary | ICD-10-CM | POA: Diagnosis not present

## 2020-05-15 DIAGNOSIS — IMO0002 Reserved for concepts with insufficient information to code with codable children: Secondary | ICD-10-CM

## 2020-05-15 DIAGNOSIS — E7800 Pure hypercholesterolemia, unspecified: Secondary | ICD-10-CM

## 2020-05-15 NOTE — Progress Notes (Addendum)
Chronic Care Management Pharmacy Note  05/15/2020 Name:  Aaron Thompson MRN:  [id number] DOB:  [birth year]  Subjective: Aaron Thompson is an 60 y.o. year old male who is a primary patient of [name], [name], MD.  The CCM team was consulted for assistance with disease management and care coordination needs.    Engaged with patient by telephone for follow up visit in response to provider referral for pharmacy case management and/or care coordination services.   Consent to Services:  The patient was given information about Chronic Care Management services, agreed to services, and gave verbal consent prior to initiation of services.  Please see initial visit note for detailed documentation.   Patient Care Team: [name], MD as PCP - General (Family Medicine) [name], MD as PCP - Cardiology (Cardiology) [name], [facility] as Pharmacist (Pharmacist)  Recent office visits: 04/02/20 - Dr. Glori Bickers - Neck pain, exam consistent with muscle spasm. Trial better pillow. Trying Voltaren gel prn. Ear pain - start Flonase daily, Claritin.  02/28/20 - PCP - Increase Jardiance to 25 mg daily Initial CCM - 02/12/20 - Recommend increasing Jardiance. 06/21/19 - PCP - History of DM, with A1c previously above 7. Still on Metformin and Jardiance. Blood sugars usually averaging around 150 recently. See notes on labs. No change in meds at this point. Continue current medication, Recheck A1c in 4 months  Recent consult visits: 04/30/20 - Telephone, [name] PA approved 04/25/20 - Cardiology - AFIB, CHF, continue current meds, RTC 6 months 04/01/20 - Echocardiogram - 25-30% 03/31/20 - Cardiology, CHF Clinic - Add Lasix 40 mg + KCl 20 on every Friday, repeat echo, see back in 3-4 months.   Hospital visits: None in previous 6 months  Objective:  Lab Results  Component Value Date   CREATININE 0.67 04/21/2020   BUN 18 04/21/2020   GFR 108.76 06/21/2019   GFRNONAA >60 04/21/2020   GFRAA 117 09/10/2019    NA 139 04/21/2020   K 4.0 04/21/2020   CALCIUM 9.2 04/21/2020   CO2 28 04/21/2020   GLUCOSE 117 (H) 04/21/2020    Lab Results  Component Value Date/Time   HGBA1C 7.8 (A) 02/28/2020 11:34 AM   HGBA1C 7.5 (H) 06/21/2019 09:45 AM   HGBA1C 7.6 (H) 04/06/2019 11:52 AM   GFR 108.76 06/21/2019 09:45 AM   GFR 117.99 04/06/2019 11:52 AM   MICROALBUR 1.7 10/09/2010 11:36 AM    Last diabetic Eye exam:  Lab Results  Component Value Date/Time   HMDIABEYEEXA No Retinopathy 04/07/2020 12:00 AM    Last diabetic Foot exam: 06/21/19 normal   Lab Results  Component Value Date   CHOL 190 06/21/2019   HDL 41.60 06/21/2019   LDLCALC 120 (H) 06/21/2019   LDLDIRECT 97.0 06/16/2016   TRIG 141.0 06/21/2019   CHOLHDL 5 06/21/2019   Hepatic Function Latest Ref Rng & Units 06/21/2019 04/06/2019 05/19/2018  Total Protein 6.0 - 8.3 g/dL 7.0 6.6 6.9  Albumin 3.5 - 5.2 g/dL 4.5 4.2 4.3  AST 0 - 37 U/L '19 14 14  ' ALT 0 - 53 U/L '19 17 16  ' Alk Phosphatase 39 - 117 U/L 50 52 50  Total Bilirubin 0.2 - 1.2 mg/dL 0.6 0.6 0.5  Bilirubin, Direct 0.0 - 0.3 mg/dL - - -   Last echo 1/20 EF 25-30%  Lab Results  Component Value Date/Time   TSH 2.09 07/05/2017 08:39 AM   TSH 1.380 12/14/2016 02:29 PM   FREET4 0.90 07/29/2015 06:44 AM  CBC Latest Ref Rng & Units 06/01/2019 04/06/2019 12/14/2016  WBC 3.4 - 10.8 x10E3/uL 6.1 7.1 8.2  Hemoglobin 13.0 - 17.7 g/dL 15.9 15.7 15.3  Hematocrit 37.5 - 51.0 % 47.0 46.7 46.1  Platelets 150 - 450 x10E3/uL 178 180.0 215   No results found for: VD25OH  Clinical ASCVD: No  The 10-year ASCVD risk score [name], et al., 2013) is: 13.4%   Values used to calculate the score:     Age: 80 years     Sex: Male     Is Non-Hispanic African American: No     Diabetic: Yes     Tobacco smoker: No     Systolic Blood Pressure: 417 mmHg     Is BP treated: Yes     HDL Cholesterol: 41.6 mg/dL     Total Cholesterol: 190 mg/dL    Depression screen [facility] 2/9 06/21/2019 05/15/2019  12/01/2018  Decreased Interest 0 0 0  Down, Depressed, Hopeless 0 0 0  PHQ - 2 Score 0 0 0  Altered sleeping - 0 0  Tired, decreased energy - 0 0  Change in appetite - 0 0  Feeling bad or failure about yourself  - 0 0  Trouble concentrating - 0 0  Moving slowly or fidgety/restless - 0 0  Suicidal thoughts - 0 0  PHQ-9 Score - 0 0  Difficult doing work/chores - Not difficult at all Not difficult at all  Some recent data might be hidden    Social History   Tobacco Use  Smoking Status Never Smoker  Smokeless Tobacco Former Systems developer  . Types: Snuff  Tobacco Comment   02/14/2013 "quit snuff in 2006"   BP Readings from Last 3 Encounters:  04/25/20 110/72  04/02/20 112/74  03/31/20 104/70   Pulse Readings from Last 3 Encounters:  04/25/20 83  04/02/20 77  03/31/20 85   Wt Readings from Last 3 Encounters:  04/25/20 288 lb (130.6 kg)  04/02/20 293 lb 1 oz (132.9 kg)  03/31/20 292 lb 3.2 oz (132.5 kg)   BMI Readings from Last 3 Encounters:  04/25/20 35.06 kg/m  04/02/20 36.15 kg/m  03/31/20 35.57 kg/m    Assessment/Interventions: Review of patient past medical history, allergies, medications, health status, including review of consultants reports, laboratory and other test data, was performed as part of comprehensive evaluation and provision of chronic care management services.   SDOH:  (Social Determinants of Health) assessments and interventions performed: Yes SDOH Interventions   Flowsheet Row Most Recent Value  SDOH Interventions   Financial Strain Interventions Other (Comment)  [Apply for patient assistance for Xarelto and Entresto]     SDOH Screenings   Alcohol Screen: Not on file  Depression (PHQ2-9): Low Risk   . PHQ-2 Score: 0  Financial Resource Strain: High Risk  . Difficulty of Paying Living Expenses: Hard  Food Insecurity: Not on file  Housing: Not on file  Physical Activity: Not on file  Social Connections: Not on file  Stress: Not on file  Tobacco  Use: Medium Risk  . Smoking Tobacco Use: Never Smoker  . Smokeless Tobacco Use: Former Soil scientist Needs: Not on file    Lluveras  Allergies  Allergen Reactions  . Atorvastatin     Muscle pain     Medications Reviewed Today    Reviewed by [name], [facility] (Pharmacist) on 05/16/20 at [place] List Status: <None>  Medication Order Taking? Sig Documenting Provider Last Dose Status Informant  acetaminophen (TYLENOL) 325 MG tablet [id number]  Take 650 mg by mouth every 6 (six) hours as needed for mild pain or moderate pain. [provider]  Active Self  ALPRAZolam Duanne Moron) 0.25 MG tablet [id number]  TAKE 1 TABLET BY MOUTH AS NEEDED FOR ANXIETY [name], MD  Active   Blood Glucose Monitoring Suppl (ONE TOUCH ULTRA 2) w/Device KIT [id number]  Use daily to check sugars as needed. Dx E11.9 [name], MD  Active   carvedilol (COREG) 25 MG tablet [id number] Yes TAKE 2 TABLETS(50 MG) BY MOUTH TWICE DAILY WITH A MEAL [name], MD Taking Active   dofetilide (TIKOSYN) 500 MCG capsule [id number] Yes Take 1 capsule (500 mcg total) by mouth 2 (two) times daily. [name], MD Taking Active   empagliflozin (JARDIANCE) 25 MG TABS tablet [id number] Yes Take 1 tablet (25 mg total) by mouth daily. [name], MD Taking Active   fluticasone [facility]) 50 MCG/ACT nasal spray [id number]  Place 2 sprays into both nostrils daily. Tower, Wynelle Fanny, MD  Active   furosemide (LASIX) 40 MG tablet [id number] Yes Take 1 tablet (40 mg total) by mouth every Friday. Bensimhon, Shaune Pascal, MD Taking Active   glipiZIDE (GLUCOTROL XL) 10 MG 24 hr tablet [id number] Yes TAKE 1 TABLET(10 MG) BY MOUTH DAILY WITH BREAKFAST [name], MD Taking Active   glucose blood (ONE TOUCH ULTRA TEST) test strip [id number]  CHECK GLUCOSE TWICE DAILY AND AS NEEDED, DX. E11.65 (UNCONTROLLED DM) [name], MD  Active   loratadine (CLARITIN) 10 MG tablet [id number]  Take 10 mg by mouth daily as  needed for allergies. [provider]  Active   Magnesium Oxide 400 MG CAPS [id number]  Take 1 capsule (400 mg total) by mouth daily. Deboraha Sprang, MD  Active   metFORMIN (GLUCOPHAGE-XR) 500 MG 24 hr tablet [id number] Yes TAKE 3 TABLETS(1500 MG) BY MOUTH DAILY WITH BREAKFAST [name], MD Taking Active   Multiple Vitamin (MULTIVITAMIN) tablet [id number]  Take 1 tablet by mouth daily. [provider]  Active Self  Omeprazole-Sodium Bicarbonate (ZEGERID) 20-1100 MG CAPS capsule [id number]  Take 1 capsule by mouth 2 (two) times daily. [provider]  Active Self  Pitavastatin Calcium (LIVALO) 1 MG TABS [id number] Yes 1 tablet daily if tolerated.  If muscle aches develop, cut back to every other day. [name], MD Taking Active   potassium chloride SA (KLOR-CON) 20 MEQ tablet [id number] Yes Take 1 tablet (20 mEq total) by mouth once a week.  Patient taking differently: Take 20 mEq by mouth once a week. Taking 2 tabs (40 mEq) daily   Bensimhon, Shaune Pascal, MD Taking Active Self  sacubitril-valsartan (ENTRESTO) 97-103 MG [id number] Yes Take 1 tablet by mouth 2 (two) times daily. Pt need to keep upcoming appt in June for further refills [name], MD Taking Active   spironolactone (ALDACTONE) 25 MG tablet [id number] Yes TAKE 1 TABLET(25 MG) BY MOUTH DAILY [name], MD Taking Active   traMADol (ULTRAM) 50 MG tablet [id number]  TAKE 1 TABLET(50 MG) BY MOUTH EVERY 12 HOURS AS NEEDED FOR PAIN [name], MD  Active   XARELTO 20 MG TABS tablet [id number] Yes TAKE 1 TABLET(20 MG) BY MOUTH DAILY WITH SUPPER [name], MD Taking Active   Med List Note Dennie Fetters, LPN 33/54/56 2563):            Patient Active Problem List   Diagnosis  Date Noted  . Neck pain 04/02/2020  . Ear pain 04/02/2020  . ICD (implantable cardioverter-defibrillator) in place 02/04/2020  . Shoulder pain, bilateral 12/01/2018  . Low back pain 09/07/2016  . Family history of  prostate cancer 06/23/2016  . Advance care planning 06/23/2016  . Colon polyps 06/23/2016  . Healthcare maintenance 03/08/2016  . A-fib ([place]) 10/14/2015  . Inappropriate shocks from ICD (implantable cardioverter-defibrillator) 03/17/2015  . Obesity (BMI 30-39.9) 12/05/2014  . ICD (implantable cardioverter-defibrillator) discharge 04/19/2014  . Chronic systolic heart failure ([place]) 02/14/2013  . Hyperlipidemia 02/05/2013  . Uncontrolled type 2 diabetes mellitus with circulatory disorder, without long-term current use of insulin ([place]) 09/30/2011  . Nonischemic cardiomyopathy ([place]) 08/18/2011  . Chest pain on exertion 08/17/2011  . Other restrictive cardiomyopathy ([place]) 07/16/2009  . OSTEOARTHROSIS UNSPEC WHETHER GEN/LOCALIZED HAND 08/16/2008  . Essential hypertension 05/11/2007  . History of renal calculi 05/11/2007  . Fatty liver 03/20/2007  . ANXIETY 10/04/2006  . HEARING IMPAIRMENT 10/04/2006  . GERD 09/20/2006    Immunization History  Administered Date(s) Administered  . Influenza, Seasonal, Injecte, Preservative Fre 12/03/2013  . Influenza,inj,Quad PF,6+ Mos 10/05/2016, 11/04/2017, 12/01/2018  . Influenza-Unspecified 12/09/2012, 11/24/2014, 11/20/2019  . Moderna Sars-Covid-2 Vaccination 05/13/2019, 06/12/2019, 10/04/2019  . Pneumococcal Polysaccharide-23 09/30/2011  . Td 07/23/2015    Conditions to be addressed/monitored:  Hypertension, Hyperlipidemia, Diabetes, Atrial Fibrillation and Heart Failure  Care Plan : [place]  Updates made by [name], [place] since 05/16/2020 12:00 AM    Problem: CHL AMB "PATIENT-SPECIFIC PROBLEM"     Goal: Disease Management   Start Date: 05/15/2020  Priority: High  Note:    Current Barriers:  . Unable to independently afford treatment regimen - Xarelto, Entresto  Pharmacist Clinical Goal(s):  [name] Patient will contact provider office for questions/concerns as evidenced notation of same in electronic health record through  collaboration with PharmD and provider.   Interventions: . 1:1 collaboration with [name], MD regarding development and update of comprehensive plan of care as evidenced by provider attestation and co-signature . Inter-disciplinary care team collaboration (see longitudinal plan of care) . Comprehensive medication review performed; medication list updated in electronic medical record  Hyperlipidemia: (LDL goal < 70) -Not ideally controlled - due for re-eval -Current treatment: . Livalo 1 mg - 1 tablet daily  -Medications previously tried: Lipitor - muscle aches  -02/12/19: Patient reports history of muscle aches with prior statins. His brother did well on Livalo so he requested to try it. Doing well on Livalo 1 mg so far. Reports he started Spring 2021 taking every other day, then increased to daily. Unclear exact timeline but appears pt may have increased dose after last lipid panel. He is open to Livalo dose adjustments if needed. Recommend updating lipid panel to assess control.  -Recommended to continue current medication - Recommend update lipid panel at PCP visit 05/27/20  Diabetes (A1c goal <7%) -Not ideally controlled - A1c 7.8% (prior to increasing Jardiance); Home BG is improving.  -Current medications: . Jardiance 25 mg - 1 tablet daily  . Metformin 500 mg ER - 3 tablets with breakfast . Glipizide ER 10 mg - 1 tablet daily with breakfast -Medications previously tried: upset stomach with metformin IR -Current home glucose readings - checking every morning  . fasting glucose: 04/25/20 - 114, 126, 119, 121, 139, 140, 124, 149, 153 (high carbs), 135, 139, 106 (sick, did not eat this day, 24 hr stomach bug), 129, 149, 160, 130, 114, 131, 149, 127,  150 (biscuits) . post prandial glucose: none -Denies hypoglycemic/hyperglycemic symptoms -Current meal patterns: reports diet is much improved . snacks: carrots, cucumbers instead of potato chips  -Current exercise: walking about 1/2  mile daily   -Educated on A1c and blood sugar goals; -Counseled to check feet daily and get yearly eye exams - up to date -Recommended to continue current medication   Atrial Fibrillation (Goal: prevent stroke and major bleeding) -s/p ablation, f/u Dr. Caryl Comes  -Current treatment: . Rhythm control: Tikosyn 500 mcg - 1 tablet BID . Anticoagulation: Xarelto 20 mg - 1 tablet daily  -Medications previously tried: none reported -Counseled on cost savings programs - pt would like to apply for Xarelto PAP -Recommended to continue current medication; Apply for Xarelto PAP.  Heart Failure (Goal: manage symptoms and prevent exacerbations) -Followed by Dr. [name] -Last ejection fraction: 25-30% (Date: 2020) -HF type: Systolic -NYHA Class: II (slight limitation of activity) -AHA HF Stage: C (Heart disease and symptoms present) -Current treatment:  Carvedilol 25 mg - 2 tablet BID  Spironolactone 25 mg - 1 tablet daily   Entresto 97-103 mg - 1 tablet twice daily  Potassium Chloride 20 mEq ER - 1 tablet once a week (Pt taking differently: taking 2 tablets daily)   Furosemide 40 mg - 1 tablet on fridays   Jardiance 25 mg - 1 tablet daily  -Medications previously tried: none  -Current home BP/HR readings: none reported -Weight loss - weight is stable around 282-283 lbs on his scale in the morning. -He does not weigh daily. He does watch for symptoms of fluid overload.  -Educated on [place] diuretic administration and potassium supplementation -Recommended to continue current medication; Patient to clarify potassium dose with cardiology.  Patient Goals/Self-Care Activities . Patient will:  - focus on medication adherence by using a pillbox  Follow Up Plan: Telephone follow up appointment with care management team member scheduled for: 1 month     Medication Assistance: Start applications for Claudie Leach - patient will sign forms in office during PCP visit 05/27/20  Star Rating  Drugs: Medication:                Last Fill:         Day Supply Jardiance 25 mg         02/28/20              90 Glipizide 10 mg           05/04/20            30 Metformin 500 mg       04/15/20            90 Pitavastain 1 mg         02/22/20            90 Entreso 97-103 mg     05/06/20            30  No gaps in adherence  Patient's preferred pharmacy is:  [facility] [place], [address] [place] [address] Phone: [phone] Fax: [phone]  Care Plan and Follow Up Patient Decision:  Patient agrees to Care Plan and Follow-up.   [name], PharmD Clinical Pharmacist [facility] [phone]    I have personally reviewed this encounter including the documentation in this note and have collaborated with the care management provider regarding care management and  care coordination activities to include development and update of the comprehensive care plan. I am certifying that I agree with the content of this note and encounter as supervising physician. Elsie Stain

## 2020-05-15 NOTE — Progress Notes (Signed)
    Chronic Care Management Pharmacy Assistant   Name: Aaron Thompson  MRN: [id number] DOB: 09-Oct-1960  Reason for Encounter: Patient Assistance Application- Xarelto and [name]   Medications: Outpatient Encounter Medications as of 05/15/2020  Medication Sig  . acetaminophen (TYLENOL) 325 MG tablet Take 650 mg by mouth every 6 (six) hours as needed for mild pain or moderate pain.  [name] ALPRAZolam (XANAX) 0.25 MG tablet TAKE 1 TABLET BY MOUTH AS NEEDED FOR ANXIETY  . Blood Glucose Monitoring Suppl (ONE TOUCH ULTRA 2) w/Device KIT Use daily to check sugars as needed. Dx E11.9  . carvedilol (COREG) 25 MG tablet TAKE 2 TABLETS(50 MG) BY MOUTH TWICE DAILY WITH A MEAL  . dofetilide (TIKOSYN) 500 MCG capsule Take 1 capsule (500 mcg total) by mouth 2 (two) times daily.  . empagliflozin (JARDIANCE) 25 MG TABS tablet Take 1 tablet (25 mg total) by mouth daily.  . fluticasone (FLONASE) 50 MCG/ACT nasal spray Place 2 sprays into both nostrils daily.  . furosemide (LASIX) 40 MG tablet Take 1 tablet (40 mg total) by mouth every Friday.  [name] glipiZIDE (GLUCOTROL XL) 10 MG 24 hr tablet TAKE 1 TABLET(10 MG) BY MOUTH DAILY WITH BREAKFAST  . glucose blood (ONE TOUCH ULTRA TEST) test strip CHECK GLUCOSE TWICE DAILY AND AS NEEDED, DX. E11.65 (UNCONTROLLED DM)  . loratadine (CLARITIN) 10 MG tablet Take 10 mg by mouth daily as needed for allergies.  . Magnesium Oxide 400 MG CAPS Take 1 capsule (400 mg total) by mouth daily.  . metFORMIN (GLUCOPHAGE-XR) 500 MG 24 hr tablet TAKE 3 TABLETS(1500 MG) BY MOUTH DAILY WITH BREAKFAST  . Multiple Vitamin (MULTIVITAMIN) tablet Take 1 tablet by mouth daily.  [name] Bicarbonate (ZEGERID) 20-1100 MG CAPS capsule Take 1 capsule by mouth 2 (two) times daily.  . Pitavastatin Calcium (LIVALO) 1 MG TABS 1 tablet daily if tolerated.  If muscle aches develop, cut back to every other day.  . potassium chloride SA (KLOR-CON) 20 MEQ tablet Take 1 tablet (20 mEq total) by mouth  once a week.  . sacubitril-valsartan (ENTRESTO) 97-103 MG Take 1 tablet by mouth 2 (two) times daily. Pt need to keep upcoming appt in June for further refills  . spironolactone (ALDACTONE) 25 MG tablet TAKE 1 TABLET(25 MG) BY MOUTH DAILY  . traMADol (ULTRAM) 50 MG tablet TAKE 1 TABLET(50 MG) BY MOUTH EVERY 12 HOURS AS NEEDED FOR PAIN  . XARELTO 20 MG TABS tablet TAKE 1 TABLET(20 MG) BY MOUTH DAILY WITH SUPPER   No facility-administered encounter medications on file as of 05/15/2020.    Patient assistance applications for Xarelto and [name] have been completed on patients behalf and send to [facility] front desk for patient to sign at his 05/27/20 appointment with Dr. [name].    Follow-Up:  Patient Assistance Coordination and Pharmacist Review  [name], CPP notified  [name], [place] Pharmacy Assistant [phone]

## 2020-05-16 NOTE — Patient Instructions (Addendum)
Dear Aaron Thompson,  Below is a summary of the goals we discussed during our follow up appointment on May 15, 2020. Please contact me anytime with questions or concerns.   Visit Information  Patient Care Plan: CCM Pharmacy Care Plan    Problem Identified: [facility] AMB "PATIENT-SPECIFIC PROBLEM"     Goal: Disease Management   Start Date: 05/15/2020  Priority: High  Note:    Current Barriers:  . Unable to independently afford treatment regimen - Xarelto, Entresto  Pharmacist Clinical Goal(s):  [name] Patient will contact provider office for questions/concerns as evidenced notation of same in electronic health record through collaboration with PharmD and provider.   Interventions: . 1:1 collaboration with [name], MD regarding development and update of comprehensive plan of care as evidenced by provider attestation and co-signature . Inter-disciplinary care team collaboration (see longitudinal plan of care) . Comprehensive medication review performed; medication list updated in electronic medical record  Hyperlipidemia: (LDL goal < 70) -Not ideally controlled - due for re-eval -Current treatment: . Livalo 1 mg - 1 tablet daily  -Medications previously tried: Lipitor - muscle aches  -02/12/19: Patient reports history of muscle aches with prior statins. His brother did well on Livalo so he requested to try it. Doing well on Livalo 1 mg so far. Reports he started Spring 2021 taking every other day, then increased to daily. Unclear exact timeline but appears pt may have increased dose after last lipid panel. He is open to Livalo dose adjustments if needed. Recommend updating lipid panel to assess control.  -Recommended to continue current medication - Recommend update lipid panel at PCP visit 05/27/20  Diabetes (A1c goal <7%) -Not ideally controlled - A1c 7.8% (prior to increasing Jardiance); Home BG is improving.  -Current medications: . Jardiance 25 mg - 1 tablet daily  . Metformin 500  mg ER - 3 tablets with breakfast . Glipizide ER 10 mg - 1 tablet daily with breakfast -Medications previously tried: upset stomach with metformin IR -Current home glucose readings - checking every morning  . fasting glucose: 04/25/20 - 114, 126, 119, 121, 139, 140, 124, 149, 153 (high carbs), 135, 139, 106 (sick, did not eat this day, 24 hr stomach bug), 129, 149, 160, 130, 114, 131, 149, 127, 150 (biscuits) . post prandial glucose: none -Denies hypoglycemic/hyperglycemic symptoms -Current meal patterns: reports diet is much improved . snacks: carrots, cucumbers instead of potato chips  -Current exercise: walking about 1/2 mile daily   -Educated on A1c and blood sugar goals; -Counseled to check feet daily and get yearly eye exams - up to date -Recommended to continue current medication   Atrial Fibrillation (Goal: prevent stroke and major bleeding) -s/p ablation, f/u Dr. Caryl Comes  -Current treatment: . Rhythm control: Tikosyn 500 mcg - 1 tablet BID . Anticoagulation: Xarelto 20 mg - 1 tablet daily  -Medications previously tried: none reported -Counseled on cost savings programs - pt would like to apply for Xarelto PAP -Recommended to continue current medication; Apply for Xarelto PAP.  Heart Failure (Goal: manage symptoms and prevent exacerbations) -Followed by Dr. [name] -Last ejection fraction: 25-30% (Date: 2020) -HF type: Systolic -NYHA Class: II (slight limitation of activity) -AHA HF Stage: C (Heart disease and symptoms present) -Current treatment:  Carvedilol 25 mg - 2 tablet BID  Spironolactone 25 mg - 1 tablet daily   Entresto 97-103 mg - 1 tablet twice daily  Potassium Chloride 20 mEq ER - 1 tablet once a week (Pt taking differently: taking 2  tablets daily)   Furosemide 40 mg - 1 tablet on fridays   Jardiance 25 mg - 1 tablet daily  -Medications previously tried: none  -Current home BP/HR readings: none reported -Weight loss - weight is stable around 282-283 lbs on  his scale in the morning. -He does not weigh daily. He does watch for symptoms of fluid overload.  -Educated on Proper diuretic administration and potassium supplementation -Recommended to continue current medication; Patient to clarify potassium dose with cardiology.  Patient Goals/Self-Care Activities . Patient will:  - focus on medication adherence by using a pillbox  Follow Up Plan: Telephone follow up appointment with care management team member scheduled for: 1 month     Next CCM phone call: June 25, 2020 at 12:30 PM  The patient verbalized understanding of instructions, educational materials, and care plan provided today and agreed to receive a mailed copy of patient instructions, educational materials, and care plan.   [name], PharmD Clinical Pharmacist [facility] at [facility] [phone]  Basics of Medicine Management Taking your medicines correctly is an important part of managing or preventing medical problems. Make sure you know what disease or condition your medicine is treating, and how and when to take it. If you do not take your medicine correctly, it may not work well and may cause unpleasant side effects, including serious health problems. What should I do when I am taking medicines?  Read all the labels and inserts that come with your medicines. Review the information often.  Talk with your pharmacist if you get a refill and notice a change in the size, color, or shape of your medicines.  Know the potential side effects for each medicine that you take.  Try to get all your medicines from the same pharmacy. The pharmacist will have all your information and will understand how your medicines will affect each other (interact).  Tell your health care provider about all your medicines, including over-the-counter medicines, vitamins, and herbal or dietary supplements. He or she will make sure that nothing will interact with any of your prescribed  medicines.   How can I take my medicines safely?  Take medicines only as told by your health care provider. ? Do not take more of your medicine than instructed. ? Do not take anyone else's medicines. ? Do not share your medicines with others. ? Do not stop taking your medicines unless your health care provider tells you to do so. ? You may need to avoid alcohol or certain foods or liquids when taking certain medicines. Follow your health care provider's instructions.  Do not split, mash, or chew your medicines unless your health care provider tells you to do so. Tell your health care provider if you have trouble swallowing your medicines.  For liquid medicine, use the dosing container that was provided. How should I organize my medicines? Know your medicines  Know what each of your medicines looks like. This includes size, color, and shape. Tell your health care provider if you are having trouble recognizing all the medicines that you are taking.  If you cannot tell your medicines apart because they look similar, keep them in original bottles.  If you cannot read the labels on the bottles, tell your pharmacist to put your medicines in containers with large print.  Review your medicines and your schedule with family members, a friend, or a caregiver. Use a pill organizer  Use a tool to organize your medicine schedule. Tools include a weekly pillbox,  a written chart, a notebook, or a calendar.  Your tool should help you remember the following things about each medicine: ? The name of the medicine. ? The amount (dose) to take. ? The schedule. This is the day and time the medicine should be taken. ? The appearance. This includes color, shape, size, and stamp. ? How to take your medicines. This includes instructions to take them with food, without food, with fluids, or with other medicines.  Create reminders for taking your medicines. Use sticky notes, or alarms on your watch, mobile  device, or phone calendar.  You may choose to use a more advanced management system. These systems have storage, alarms, and visual and audio prompts.  Some medicines can be taken on an "as-needed" basis. These include medicines for nausea or pain. If you take an as-needed medicine, write down the name and dose, as well as the date and time that you took it.   How should I plan for travel?  Take your pillbox, medicines, and organization system with you when traveling.  Have your medicines refilled before you travel. This will ensure that you do not run out of your medicines while you are away from home.  Always carry an updated list of your medicines with you. If there is an emergency, a first responder can quickly see what medicines you are taking.  Do not pack your medicines in checked luggage in case your luggage is lost or delayed.  If any of your medicines is considered a controlled substance, make sure you bring a letter from your health care provider with you. How should I store and discard my medicines? For safe storage:  Store medicines in a cool, dry area away from light, or as directed by your health care provider. Do not store medicines in the bathroom. Heat and humidity will affect them.  Do not store your medicines with other chemicals, or with medicines for pets or other household members.  Keep medicines away from children and pets. Do not leave them on counters or bedside tables. Store them in high cabinets or on high shelves. For safe disposal:  Check expiration dates regularly. Do not take expired medicines. Discard medicines that are older than the expiration date.  Learn a safe way to dispose of your medicines. You may: ? Use a local government, hospital, or pharmacy medicine-take-back program. ? Mix the medicines with inedible substances, put them in a sealed bag or empty container, and throw them in the trash. What should I remember?  Tell your health care  provider if you: ? Experience side effects. ? Have new symptoms. ? Have other concerns about taking your medicines.  Review your medicines regularly with your health care provider. Other medicines, diet, medical conditions, weight changes, and daily habits can all affect how medicines work. Ask if you need to continue taking each medicine, and discuss how well each one is working.  Refill your medicines early to avoid running out of them.  In case of an accidental overdose, call your local [place] at [phone] or visit your local emergency department immediately. This is important. Summary  Taking your medicines correctly is an important part of managing or preventing medical problems.  You need to make sure that you understand what you are taking a medicine for, as well as how and when you need to take it.  Know your medicines and use a pill organizer to help you take your medicines correctly.  In case of an  accidental overdose, call your local [place] at [phone] or visit your local emergency department immediately. This is important. This information is not intended to replace advice given to you by your health care provider. Make sure you discuss any questions you have with your health care provider. Document Revised: 01/06/2017 Document Reviewed: 01/06/2017 Elsevier Patient Education  2021 [employer].

## 2020-05-18 ENCOUNTER — Other Ambulatory Visit: Payer: Self-pay | Admitting: Family Medicine

## 2020-05-18 DIAGNOSIS — E7800 Pure hypercholesterolemia, unspecified: Secondary | ICD-10-CM

## 2020-05-24 ENCOUNTER — Other Ambulatory Visit: Payer: Self-pay | Admitting: Family Medicine

## 2020-05-26 NOTE — Telephone Encounter (Signed)
Refill request for Tramadol 50 mg tablets  LOV - 04/02/20 Next OV - 05/27/20 Last refill - 02/20/20 #60/2  Also needs refill on Livalo 1 mg tablets

## 2020-05-27 ENCOUNTER — Ambulatory Visit (INDEPENDENT_AMBULATORY_CARE_PROVIDER_SITE_OTHER): Payer: HMO | Admitting: Family Medicine

## 2020-05-27 ENCOUNTER — Encounter: Payer: Self-pay | Admitting: Family Medicine

## 2020-05-27 ENCOUNTER — Other Ambulatory Visit: Payer: Self-pay

## 2020-05-27 VITALS — BP 116/68 | HR 81 | Temp 97.9°F | Ht 76.0 in | Wt 286.0 lb

## 2020-05-27 DIAGNOSIS — M778 Other enthesopathies, not elsewhere classified: Secondary | ICD-10-CM | POA: Diagnosis not present

## 2020-05-27 DIAGNOSIS — E1159 Type 2 diabetes mellitus with other circulatory complications: Secondary | ICD-10-CM | POA: Diagnosis not present

## 2020-05-27 DIAGNOSIS — E7800 Pure hypercholesterolemia, unspecified: Secondary | ICD-10-CM

## 2020-05-27 DIAGNOSIS — E1165 Type 2 diabetes mellitus with hyperglycemia: Secondary | ICD-10-CM

## 2020-05-27 DIAGNOSIS — I10 Essential (primary) hypertension: Secondary | ICD-10-CM

## 2020-05-27 DIAGNOSIS — IMO0002 Reserved for concepts with insufficient information to code with codable children: Secondary | ICD-10-CM

## 2020-05-27 LAB — LIPID PANEL
Cholesterol: 162 mg/dL (ref 0–200)
HDL: 34.6 mg/dL — ABNORMAL LOW (ref >39.00)
LDL Cholesterol: 100 mg/dL — ABNORMAL HIGH (ref 0–99)
NonHDL: 127.88
Total CHOL/HDL Ratio: 5
Triglycerides: 140 mg/dL (ref 0.0–149.0)
VLDL: 28 mg/dL (ref 0.0–40.0)

## 2020-05-27 LAB — COMPREHENSIVE METABOLIC PANEL
ALT: 17 U/L (ref 0–53)
AST: 17 U/L (ref 0–37)
Albumin: 4.3 g/dL (ref 3.5–5.2)
Alkaline Phosphatase: 44 U/L (ref 39–117)
BUN: 15 mg/dL (ref 6–23)
CO2: 29 mEq/L (ref 19–32)
Calcium: 9.1 mg/dL (ref 8.4–10.5)
Chloride: 105 mEq/L (ref 96–112)
Creatinine, Ser: 0.71 mg/dL (ref 0.40–1.50)
GFR: 101.06 mL/min (ref >60.00)
Glucose, Bld: 95 mg/dL (ref 70–99)
Potassium: 4.1 mEq/L (ref 3.5–5.1)
Sodium: 141 mEq/L (ref 135–145)
Total Bilirubin: 0.5 mg/dL (ref 0.2–1.2)
Total Protein: 6.8 g/dL (ref 6.0–8.3)

## 2020-05-27 LAB — HEMOGLOBIN A1C: Hgb A1c MFr Bld: 7.2 % — ABNORMAL HIGH (ref 4.6–6.5)

## 2020-05-27 MED ORDER — LIVALO 1 MG PO TABS: ORAL_TABLET | ORAL | Status: DC

## 2020-05-27 MED ORDER — POTASSIUM CHLORIDE CRYS ER 20 MEQ PO TBCR: 20.0000 meq | EXTENDED_RELEASE_TABLET | Freq: Two times a day (BID) | ORAL | Status: DC

## 2020-05-27 NOTE — Telephone Encounter (Signed)
Sent. Thanks.   

## 2020-05-27 NOTE — Patient Instructions (Signed)
Try using a thumb spica brace and ice for 5 minutes at a time.  Take care.  Glad to see you. Go to the lab on the way out.   If you have mychart we'll likely use that to update you.    We'll go from there.

## 2020-05-27 NOTE — Progress Notes (Signed)
This visit occurred during the SARS-CoV-2 public health emergency.  Safety protocols were in place, including screening questions prior to the visit, additional usage of staff PPE, and extensive cleaning of exam room while observing appropriate contact time as indicated for disinfecting solutions.  Diabetes:  Using medications without difficulties: yes Hypoglycemic episodes: no Hyperglycemic episodes:no Feet problems: no Blood Sugars averaging: ~120-130 eye exam within last year: yes Weight down, labs pending.    He had been taking 2 potassium every day.  Recheck lab pending. Taking lasix on Friday.    Taking pitavastatin daily, has tolerated that dose.    L thumb sx for about 3 weeks.  Granddaughter grabbed his thumb to extreme of ROM.  Pain on testing thumb extensor tendon.    Meds, vitals, and allergies reviewed.  ROS: Per HPI unless specifically indicated in ROS section   GEN: nad, alert and oriented HEENT: ncat NECK: supple w/o LA CV: rrr. PULM: ctab, no inc wob ABD: soft, +bs EXT: no edema SKIN: no acute rash Left thumb with normal range of motion for flexion and extension and neurovascularly intact but he does have pain on testing the extensor tendons.  Able to make a fist otherwise.  Diabetic foot exam: Normal inspection No skin breakdown No calluses  Normal DP pulses Normal sensation to light touch and monofilament Nails normal

## 2020-05-28 DIAGNOSIS — M778 Other enthesopathies, not elsewhere classified: Secondary | ICD-10-CM | POA: Insufficient documentation

## 2020-05-28 NOTE — Assessment & Plan Note (Signed)
Has been taking 2 potassium tablets every day.  See notes on follow-up labs.  He is taking Lasix on Fridays.  He does not have edema on exam.

## 2020-05-28 NOTE — Assessment & Plan Note (Signed)
Reasonable to use a spica splint and then update me as needed.

## 2020-05-28 NOTE — Assessment & Plan Note (Signed)
Continue Jardiance 25 mg along with metformin 3 tablets daily.  See notes on labs.  Continue work on diet and exercise and weight.

## 2020-05-28 NOTE — Assessment & Plan Note (Signed)
Continue pitavastatin.

## 2020-05-29 ENCOUNTER — Telehealth: Payer: Self-pay | Admitting: Internal Medicine

## 2020-05-29 NOTE — Telephone Encounter (Signed)
Received a call from Mr. Schauf that his device vibrated twice this evening starting at 8pm. He has a [employer] CRT-D with battery replacement in 05/2019. Recent device interrogation in 02/2020 showed good battery life. He is feeling well. Will update Dr. Caryl Comes. Have asked him to send in a remote interrogation.  Alric Quan, MD Cardiology [place]

## 2020-05-30 ENCOUNTER — Telehealth: Payer: Self-pay | Admitting: Internal Medicine

## 2020-05-30 NOTE — Telephone Encounter (Signed)
Successful telephone encounter to [facility] D. Bobak to follow up on his concern over most recent remote transmission readings. Mr. Youngberg had called this morning stating he had already spoken to "a friend" about his concerns but wished to speak to someone in the device clinic. Mr. Bullinger states he spoke with [employer] rep last evening regarding the transmission and his LV lead impedance. Upon further review of past transmission, LV lead impedance has been trending up and last evening resulted at 2225. RV lead impedance, sense, and auto cap WNL. Patient scheduled for in-clinic device check 06/05/20 at 0920. Patient questions and concerns answered.

## 2020-05-30 NOTE — Telephone Encounter (Signed)
New message:     Patient calling stating that last night he sent a transmission and had a friend to look at it and would like to speak with some one concering his device.

## 2020-06-03 ENCOUNTER — Telehealth: Payer: Self-pay

## 2020-06-03 NOTE — Chronic Care Management (AMB) (Addendum)
    Chronic Care Management Pharmacy Assistant   Name: Aaron Thompson  MRN: [id number] DOB: 1960-02-22  Reason for Encounter: Patient Assistance Update- Aaron Thompson   Medications: Outpatient Encounter Medications as of 06/03/2020  Medication Sig   acetaminophen (TYLENOL) 325 MG tablet Take 650 mg by mouth every 6 (six) hours as needed for mild pain or moderate pain.   ALPRAZolam (XANAX) 0.25 MG tablet TAKE 1 TABLET BY MOUTH AS NEEDED FOR ANXIETY   Blood Glucose Monitoring Suppl (ONE TOUCH ULTRA 2) w/Device KIT Use daily to check sugars as needed. Dx E11.9   carvedilol (COREG) 25 MG tablet TAKE 2 TABLETS(50 MG) BY MOUTH TWICE DAILY WITH A MEAL   dofetilide (TIKOSYN) 500 MCG capsule Take 1 capsule (500 mcg total) by mouth 2 (two) times daily.   empagliflozin (JARDIANCE) 25 MG TABS tablet Take 1 tablet (25 mg total) by mouth daily.   fluticasone (FLONASE) 50 MCG/ACT nasal spray Place 2 sprays into both nostrils daily.   furosemide (LASIX) 40 MG tablet Take 1 tablet (40 mg total) by mouth every Friday.   glipiZIDE (GLUCOTROL XL) 10 MG 24 hr tablet TAKE 1 TABLET(10 MG) BY MOUTH DAILY WITH BREAKFAST   glucose blood (ONE TOUCH ULTRA TEST) test strip CHECK GLUCOSE TWICE DAILY AND AS NEEDED, DX. E11.65 (UNCONTROLLED DM)   loratadine (CLARITIN) 10 MG tablet Take 10 mg by mouth daily as needed for allergies.   Magnesium Oxide 400 MG CAPS Take 1 capsule (400 mg total) by mouth daily.   metFORMIN (GLUCOPHAGE-XR) 500 MG 24 hr tablet TAKE 3 TABLETS(1500 MG) BY MOUTH DAILY WITH BREAKFAST   Multiple Vitamin (MULTIVITAMIN) tablet Take 1 tablet by mouth daily.   Omeprazole-Sodium Bicarbonate (ZEGERID) 20-1100 MG CAPS capsule Take 1 capsule by mouth 2 (two) times daily.   Pitavastatin Calcium (LIVALO) 1 MG TABS TAKE 1 TABLET BY MOUTH DAILY IF TOLERATED   potassium chloride SA (KLOR-CON) 20 MEQ tablet Take 1 tablet (20 mEq total) by mouth 2 (two) times daily.   sacubitril-valsartan (ENTRESTO) 97-103 MG Take 1  tablet by mouth 2 (two) times daily. Pt need to keep upcoming appt in June for further refills   spironolactone (ALDACTONE) 25 MG tablet TAKE 1 TABLET(25 MG) BY MOUTH DAILY   traMADol (ULTRAM) 50 MG tablet TAKE 1 TABLET(50 MG) BY MOUTH EVERY 12 HOURS AS NEEDED FOR PAIN   XARELTO 20 MG TABS tablet TAKE 1 TABLET(20 MG) BY MOUTH DAILY WITH SUPPER   No facility-administered encounter medications on file as of 06/03/2020.   Contacted [employer]  to follow up on patient assistance application for [employer]. Per representative at [employer] states they have not received any portion of the application. Will follow up in about 2 weeks   Follow-Up:  Patient Assistance Coordination and Pharmacist Review  [name], CPP notified  [name], [place] Assistant [phone]  I have reviewed the care management and care coordination activities outlined in this encounter and I am certifying that I agree with the content of this note. No further action required.  [name], PharmD Clinical Pharmacist [facility] [phone]

## 2020-06-04 ENCOUNTER — Ambulatory Visit (INDEPENDENT_AMBULATORY_CARE_PROVIDER_SITE_OTHER): Payer: HMO

## 2020-06-04 DIAGNOSIS — I5022 Chronic systolic (congestive) heart failure: Secondary | ICD-10-CM | POA: Diagnosis not present

## 2020-06-04 DIAGNOSIS — I480 Paroxysmal atrial fibrillation: Secondary | ICD-10-CM

## 2020-06-04 LAB — CUP PACEART REMOTE DEVICE CHECK
Battery Remaining Longevity: 70 mo
Battery Remaining Percentage: 80 %
Battery Voltage: 2.98 V
Brady Statistic AP VP Percent: 1 %
Brady Statistic AP VS Percent: 1 %
Brady Statistic AS VP Percent: 97 %
Brady Statistic AS VS Percent: 1.1 %
Brady Statistic RA Percent Paced: 1 %
Date Time Interrogation Session: 20220511020018
HighPow Impedance: 81 Ohm
HighPow Impedance: 81 Ohm
Implantable Lead Implant Date: 20150121
Implantable Lead Implant Date: 20150121
Implantable Lead Implant Date: 20150121
Implantable Lead Location: 753858
Implantable Lead Location: 753859
Implantable Lead Location: 753860
Implantable Lead Model: 7122
Implantable Pulse Generator Implant Date: 20210512
Lead Channel Impedance Value: 2400 Ohm
Lead Channel Impedance Value: 480 Ohm
Lead Channel Impedance Value: 540 Ohm
Lead Channel Pacing Threshold Amplitude: 0.875 V
Lead Channel Pacing Threshold Amplitude: 1.125 V
Lead Channel Pacing Threshold Amplitude: 1.5 V
Lead Channel Pacing Threshold Pulse Width: 0.4 ms
Lead Channel Pacing Threshold Pulse Width: 0.4 ms
Lead Channel Pacing Threshold Pulse Width: 0.5 ms
Lead Channel Sensing Intrinsic Amplitude: 12 mV
Lead Channel Sensing Intrinsic Amplitude: 4.7 mV
Lead Channel Setting Pacing Amplitude: 1.875
Lead Channel Setting Pacing Amplitude: 2.125
Lead Channel Setting Pacing Amplitude: 5 V
Lead Channel Setting Pacing Pulse Width: 0.4 ms
Lead Channel Setting Pacing Pulse Width: 0.5 ms
Lead Channel Setting Sensing Sensitivity: 0.5 mV
Pulse Gen Serial Number: 9890691

## 2020-06-05 ENCOUNTER — Other Ambulatory Visit: Payer: Self-pay | Admitting: Cardiovascular Disease

## 2020-06-05 ENCOUNTER — Ambulatory Visit (INDEPENDENT_AMBULATORY_CARE_PROVIDER_SITE_OTHER): Payer: HMO | Admitting: Emergency Medicine

## 2020-06-05 ENCOUNTER — Other Ambulatory Visit: Payer: Self-pay

## 2020-06-05 DIAGNOSIS — Z95810 Presence of automatic (implantable) cardiac defibrillator: Secondary | ICD-10-CM

## 2020-06-05 DIAGNOSIS — I5022 Chronic systolic (congestive) heart failure: Secondary | ICD-10-CM

## 2020-06-05 LAB — CUP PACEART INCLINIC DEVICE CHECK
Battery Remaining Longevity: 56 mo
Brady Statistic RA Percent Paced: 0.29 %
Brady Statistic RV Percent Paced: 98 %
Date Time Interrogation Session: 20220512091700
HighPow Impedance: 83.25 Ohm
Implantable Lead Implant Date: 20150121
Implantable Lead Implant Date: 20150121
Implantable Lead Implant Date: 20150121
Implantable Lead Location: 753858
Implantable Lead Location: 753859
Implantable Lead Location: 753860
Implantable Lead Model: 7122
Implantable Pulse Generator Implant Date: 20210512
Lead Channel Impedance Value: 487.5 Ohm
Lead Channel Impedance Value: 600 Ohm
Lead Channel Impedance Value: 650 Ohm
Lead Channel Pacing Threshold Amplitude: 0.75 V
Lead Channel Pacing Threshold Amplitude: 1 V
Lead Channel Pacing Threshold Amplitude: 1.125 V
Lead Channel Pacing Threshold Pulse Width: 0.4 ms
Lead Channel Pacing Threshold Pulse Width: 0.4 ms
Lead Channel Pacing Threshold Pulse Width: 0.5 ms
Lead Channel Sensing Intrinsic Amplitude: 11.7 mV
Lead Channel Sensing Intrinsic Amplitude: 5 mV
Lead Channel Setting Pacing Amplitude: 1.75 V
Lead Channel Setting Pacing Amplitude: 2 V
Lead Channel Setting Pacing Amplitude: 2.125
Lead Channel Setting Pacing Pulse Width: 0.4 ms
Lead Channel Setting Pacing Pulse Width: 0.5 ms
Lead Channel Setting Sensing Sensitivity: 0.5 mV
Pulse Gen Serial Number: 9890691

## 2020-06-05 NOTE — Patient Instructions (Signed)
Device Clinic: [phone] Call if you have any questions or concerns.  

## 2020-06-05 NOTE — Progress Notes (Signed)
CRT-D device check in office. Thresholds and sensing consistent with previous device measurements in RA and RV. LV threshold and impedance elevated. Lead impedance trends stable over time for RA and RV lead. LV lead tested in pole 2 with no capture at 5 v at 1 ms and impedance > 3000. Testing of LV lead distal tip 1 to RV coil showed impedance of 610 ohms and threshold of 1.125 V at .5 ms. testing done with industry present and all changes approved by Dr Caryl Comes. AT/AF burden < 1%. No ventricular arrhythmia episodes recorded. Patient bi-ventricularly pacing 98% of the time. Device programmed with appropriate safety margins. Heart failure diagnostics reviewed and trends are stable for patient. Audible/vibratory alerts demonstrated for patient. No changes made this session. Estimated longevity 4 years , 8 months.  Patient enrolled in remote follow up and next remote 09/03/20. Patient education completed including shock plan.

## 2020-06-12 ENCOUNTER — Telehealth: Payer: Self-pay

## 2020-06-12 NOTE — Telephone Encounter (Signed)
Patient reports he felt his heart " skipping" while he was working ut last night. He sent a transmission that showed device function WNL, no episodes or treatment by device. Presenting showed an occasional PVC. Patient reports he had no other s/sx associated with the palpitations that lasted a few seconds. ED precautions given.

## 2020-06-12 NOTE — Telephone Encounter (Signed)
The patient left a voicemail stating he thinks his heart is out of rhythm. He is experiencing some SOB. He sent a transmission this morning and would like for the nurse to call him with the results. His phone number is [phone].

## 2020-06-13 NOTE — Chronic Care Management (AMB) (Signed)
Contacted patient regarding Entresto patient assistance application. He states that he forgot to sign it. He notes he will go by Dr. Carole Civil office today to sign. Will follow up next month.    Follow-Up:  Patient Assistance Coordination and Pharmacist Review  [name], CPP notified  [name], [place] Pharmacy Assistant [phone]

## 2020-06-13 NOTE — Telephone Encounter (Signed)
Ria Comment sent application to [employer] for [employer].

## 2020-06-14 ENCOUNTER — Other Ambulatory Visit: Payer: Self-pay | Admitting: Cardiovascular Disease

## 2020-06-16 ENCOUNTER — Other Ambulatory Visit: Payer: Self-pay | Admitting: Family Medicine

## 2020-06-16 NOTE — Telephone Encounter (Signed)
Prescription refill request for Xarelto received.   Indication: afib  Last office visit: Nishan, 04/25/2020 Weight: 129.7 kg  Age: 60 yo  Scr: 0.71, 05/27/2020 CrCl: 208 ml/min   Pt is on the correct dose of Xarelto, prescription refill sent for Xarelto 20mg  daily.

## 2020-06-16 NOTE — Telephone Encounter (Signed)
Refill request for Alprazolam 0.25 mg tablets  LOV - 05/27/20 Next OV - not scheduled Last refill - 02/24/20 #30/1

## 2020-06-17 NOTE — Telephone Encounter (Signed)
Sent. Thanks.   

## 2020-06-25 ENCOUNTER — Telehealth: Payer: HMO

## 2020-06-25 NOTE — Progress Notes (Deleted)
Chronic Care Management Pharmacy Note  05/15/2020 Name:  Aaron Thompson MRN:  [id number] DOB:  1960-10-13  Subjective: Aaron Thompson is an 60 y.o. year old male who is a primary patient of [name], [name], MD.  The CCM team was consulted for assistance with disease management and care coordination needs.    Engaged with patient by telephone for follow up visit in response to provider referral for pharmacy case management and/or care coordination services.   Consent to Services:  The patient was given information about Chronic Care Management services, agreed to services, and gave verbal consent prior to initiation of services.  Please see initial visit note for detailed documentation.   Patient Care Team: [name], MD as PCP - General (Family Medicine) [name], MD as PCP - Cardiology (Cardiology) [name], [facility] as Pharmacist (Pharmacist)  Recent office visits: 04/02/20 - Dr. Glori Bickers - Neck pain, exam consistent with muscle spasm. Trial better pillow. Trying Voltaren gel prn. Ear pain - start Flonase daily, Claritin.  02/28/20 - PCP - Increase Jardiance to 25 mg daily Initial CCM - 02/12/20 - Recommend increasing Jardiance. 06/21/19 - PCP - History of DM, with A1c previously above 7. Still on Metformin and Jardiance. Blood sugars usually averaging around 150 recently. See notes on labs. No change in meds at this point. Continue current medication, Recheck A1c in 4 months  Recent consult visits: 04/30/20 - Telephone, [name] PA approved 04/25/20 - Cardiology - AFIB, CHF, continue current meds, RTC 6 months 04/01/20 - Echocardiogram - 25-30% 03/31/20 - Cardiology, CHF Clinic - Add Lasix 40 mg + KCl 20 on every Friday, repeat echo, see back in 3-4 months.   Hospital visits: None in previous 6 months  Objective:  Lab Results  Component Value Date   CREATININE 0.71 05/27/2020   BUN 15 05/27/2020   GFR 101.06 05/27/2020   GFRNONAA >60 04/21/2020   GFRAA 117 09/10/2019    NA 141 05/27/2020   K 4.1 05/27/2020   CALCIUM 9.1 05/27/2020   CO2 29 05/27/2020   GLUCOSE 95 05/27/2020    Lab Results  Component Value Date/Time   HGBA1C 7.2 (H) 05/27/2020 12:22 PM   HGBA1C 7.8 (A) 02/28/2020 11:34 AM   HGBA1C 7.5 (H) 06/21/2019 09:45 AM   GFR 101.06 05/27/2020 12:22 PM   GFR 108.76 06/21/2019 09:45 AM   MICROALBUR 1.7 10/09/2010 11:36 AM    Last diabetic Eye exam:  Lab Results  Component Value Date/Time   HMDIABEYEEXA No Retinopathy 04/07/2020 12:00 AM    Last diabetic Foot exam: 06/21/19 normal   Lab Results  Component Value Date   CHOL 162 05/27/2020   HDL 34.60 (L) 05/27/2020   LDLCALC 100 (H) 05/27/2020   LDLDIRECT 97.0 06/16/2016   TRIG 140.0 05/27/2020   CHOLHDL 5 05/27/2020   Hepatic Function Latest Ref Rng & Units 05/27/2020 06/21/2019 04/06/2019  Total Protein 6.0 - 8.3 g/dL 6.8 7.0 6.6  Albumin 3.5 - 5.2 g/dL 4.3 4.5 4.2  AST 0 - 37 U/L '17 19 14  ' ALT 0 - 53 U/L '17 19 17  ' Alk Phosphatase 39 - 117 U/L 44 50 52  Total Bilirubin 0.2 - 1.2 mg/dL 0.5 0.6 0.6  Bilirubin, Direct 0.0 - 0.3 mg/dL - - -   Last echo 1/20 EF 25-30%  Lab Results  Component Value Date/Time   TSH 2.09 07/05/2017 08:39 AM   TSH 1.380 12/14/2016 02:29 PM   FREET4 0.90 07/29/2015 06:44 AM  CBC Latest Ref Rng & Units 06/01/2019 04/06/2019 12/14/2016  WBC 3.4 - 10.8 x10E3/uL 6.1 7.1 8.2  Hemoglobin 13.0 - 17.7 g/dL 15.9 15.7 15.3  Hematocrit 37.5 - 51.0 % 47.0 46.7 46.1  Platelets 150 - 450 x10E3/uL 178 180.0 215   No results found for: VD25OH  Clinical ASCVD: No  The 10-year ASCVD risk score [name], et al., 2013) is: 14.4%   Values used to calculate the score:     Age: 71 years     Sex: Male     Is Non-Hispanic African American: No     Diabetic: Yes     Tobacco smoker: No     Systolic Blood Pressure: 638 mmHg     Is BP treated: Yes     HDL Cholesterol: 34.6 mg/dL     Total Cholesterol: 162 mg/dL    Depression screen [facility] 2/9 06/21/2019 05/15/2019  12/01/2018  Decreased Interest 0 0 0  Down, Depressed, Hopeless 0 0 0  PHQ - 2 Score 0 0 0  Altered sleeping - 0 0  Tired, decreased energy - 0 0  Change in appetite - 0 0  Feeling bad or failure about yourself  - 0 0  Trouble concentrating - 0 0  Moving slowly or fidgety/restless - 0 0  Suicidal thoughts - 0 0  PHQ-9 Score - 0 0  Difficult doing work/chores - Not difficult at all Not difficult at all  Some recent data might be hidden    Social History   Tobacco Use  Smoking Status Never Smoker  Smokeless Tobacco Former Systems developer  . Types: Snuff  Tobacco Comment   02/14/2013 "quit snuff in 2006"   BP Readings from Last 3 Encounters:  05/27/20 116/68  04/25/20 110/72  04/02/20 112/74   Pulse Readings from Last 3 Encounters:  05/27/20 81  04/25/20 83  04/02/20 77   Wt Readings from Last 3 Encounters:  05/27/20 286 lb (129.7 kg)  04/25/20 288 lb (130.6 kg)  04/02/20 293 lb 1 oz (132.9 kg)   BMI Readings from Last 3 Encounters:  05/27/20 34.81 kg/m  04/25/20 35.06 kg/m  04/02/20 36.15 kg/m    Assessment/Interventions: Review of patient past medical history, allergies, medications, health status, including review of consultants reports, laboratory and other test data, was performed as part of comprehensive evaluation and provision of chronic care management services.   SDOH:  (Social Determinants of Health) assessments and interventions performed: Yes  SDOH Screenings   Alcohol Screen: Not on file  Depression (GTX6-4): Not on file  Financial Resource Strain: High Risk  . Difficulty of Paying Living Expenses: Hard  Food Insecurity: Not on file  Housing: Not on file  Physical Activity: Not on file  Social Connections: Not on file  Stress: Not on file  Tobacco Use: Medium Risk  . Smoking Tobacco Use: Never Smoker  . Smokeless Tobacco Use: Former Soil scientist Needs: Not on file    [place]  Allergies  Allergen Reactions  . Atorvastatin     Muscle  pain     Medications Reviewed Today    Reviewed by Drake Leach, RN (Registered Nurse) on 06/05/20 at [phone]  Med List Status: <None>  Medication Order Taking? Sig Documenting Provider Last Dose Status Informant  acetaminophen (TYLENOL) 325 MG tablet [id number] Yes Take 650 mg by mouth every 6 (six) hours as needed for mild pain or moderate pain. [provider] Taking Active Self  ALPRAZolam (XANAX) 0.25 MG tablet  [id number] Yes TAKE 1 TABLET BY MOUTH AS NEEDED FOR ANXIETY [name], MD Taking Active   Blood Glucose Monitoring Suppl (ONE TOUCH ULTRA 2) w/Device KIT [id number] Yes Use daily to check sugars as needed. Dx E11.9 [name], MD Taking Active   carvedilol (COREG) 25 MG tablet [id number] Yes TAKE 2 TABLETS(50 MG) BY MOUTH TWICE DAILY WITH A MEAL [name], MD Taking Active   dofetilide (TIKOSYN) 500 MCG capsule [id number] Yes Take 1 capsule (500 mcg total) by mouth 2 (two) times daily. [name], MD Taking Active   empagliflozin (JARDIANCE) 25 MG TABS tablet [id number] Yes Take 1 tablet (25 mg total) by mouth daily. [name], MD Taking Active   fluticasone [facility]) 50 MCG/ACT nasal spray [id number] Yes Place 2 sprays into both nostrils daily. Tower, Wynelle Fanny, MD Taking Active   furosemide (LASIX) 40 MG tablet [id number] Yes Take 1 tablet (40 mg total) by mouth every Friday. Bensimhon, Shaune Pascal, MD Taking Active   glipiZIDE (GLUCOTROL XL) 10 MG 24 hr tablet [id number] Yes TAKE 1 TABLET(10 MG) BY MOUTH DAILY WITH BREAKFAST [name], MD Taking Active   glucose blood (ONE TOUCH ULTRA TEST) test strip [id number] Yes CHECK GLUCOSE TWICE DAILY AND AS NEEDED, DX. E11.65 (UNCONTROLLED DM) [name], MD Taking Active   loratadine (CLARITIN) 10 MG tablet [id number] Yes Take 10 mg by mouth daily as needed for allergies. [provider] Taking Active   Magnesium Oxide 400 MG CAPS [id number] Yes Take 1 capsule (400 mg total) by mouth daily. Deboraha Sprang, MD Taking Active   metFORMIN (GLUCOPHAGE-XR) 500 MG 24 hr tablet [id number] Yes TAKE 3 TABLETS(1500 MG) BY MOUTH DAILY WITH BREAKFAST [name], MD Taking Active   Multiple Vitamin (MULTIVITAMIN) tablet [id number] Yes Take 1 tablet by mouth daily. [provider] Taking Active Self  Omeprazole-Sodium Bicarbonate (ZEGERID) 20-1100 MG CAPS capsule [id number] Yes Take 1 capsule by mouth 2 (two) times daily. [provider] Taking Active Self  Pitavastatin Calcium (LIVALO) 1 MG TABS [id number] Yes TAKE 1 TABLET BY MOUTH DAILY IF TOLERATED [name], MD Taking Active   potassium chloride SA (KLOR-CON) 20 MEQ tablet [id number] Yes Take 1 tablet (20 mEq total) by mouth 2 (two) times daily. [name], MD Taking Active   sacubitril-valsartan [facility]) 97-103 MG [id number] Yes Take 1 tablet by mouth 2 (two) times daily. Pt need to keep upcoming appt in June for further refills [name], MD Taking Active   spironolactone (ALDACTONE) 25 MG tablet [id number] Yes TAKE 1 TABLET(25 MG) BY MOUTH DAILY [name], MD Taking Active   traMADol (ULTRAM) 50 MG tablet [id number] Yes TAKE 1 TABLET(50 MG) BY MOUTH EVERY 12 HOURS AS NEEDED FOR PAIN [name], MD Taking Active   XARELTO 20 MG TABS tablet [id number] Yes TAKE 1 TABLET(20 MG) BY MOUTH DAILY WITH SUPPER [name], MD Taking Active   Med List Note Dennie Fetters, LPN 68/34/19 6222):            Patient Active Problem List   Diagnosis Date Noted  . Tendinitis of thumb 05/28/2020  . Neck pain 04/02/2020  . Ear pain 04/02/2020  . ICD (implantable cardioverter-defibrillator) in place 02/04/2020  . Shoulder pain, bilateral 12/01/2018  . Low back pain 09/07/2016  . Family history of prostate cancer 06/23/2016  . Advance care planning 06/23/2016  . Colon polyps 06/23/2016  . Healthcare maintenance 03/08/2016  .  A-fib ([place]) 10/14/2015  . Inappropriate shocks from ICD (implantable  cardioverter-defibrillator) 03/17/2015  . Obesity (BMI 30-39.9) 12/05/2014  . ICD (implantable cardioverter-defibrillator) discharge 04/19/2014  . Chronic systolic heart failure ([place]) 02/14/2013  . Hyperlipidemia 02/05/2013  . Uncontrolled type 2 diabetes mellitus with circulatory disorder, without long-term current use of insulin ([place]) 09/30/2011  . Nonischemic cardiomyopathy ([place]) 08/18/2011  . Chest pain on exertion 08/17/2011  . Other restrictive cardiomyopathy ([place]) 07/16/2009  . OSTEOARTHROSIS UNSPEC WHETHER GEN/LOCALIZED HAND 08/16/2008  . Essential hypertension 05/11/2007  . History of renal calculi 05/11/2007  . Fatty liver 03/20/2007  . ANXIETY 10/04/2006  . HEARING IMPAIRMENT 10/04/2006  . GERD 09/20/2006    Immunization History  Administered Date(s) Administered  . Influenza, Seasonal, Injecte, Preservative Fre 12/03/2013  . Influenza,inj,Quad PF,6+ Mos 10/05/2016, 11/04/2017, 12/01/2018  . Influenza-Unspecified 12/09/2012, 11/24/2014, 11/20/2019  . Moderna Sars-Covid-2 Vaccination 05/13/2019, 06/12/2019, 10/04/2019  . Pneumococcal Polysaccharide-23 09/30/2011  . Td 07/23/2015    Conditions to be addressed/monitored:  Hypertension, Hyperlipidemia, Diabetes, Atrial Fibrillation and Heart Failure  There are no care plans that you recently modified to display for this patient.   Medication Assistance: Start applications for Claudie Leach - patient will sign forms in office during PCP visit 05/27/20  [name]: Medication:                Last Fill:         Day Supply Jardiance 25 mg         02/28/20              90 Glipizide 10 mg           05/04/20            30 Metformin 500 mg       04/15/20            90 Pitavastain 1 mg         02/22/20            90 Entreso 97-103 mg     05/06/20            30  No gaps in adherence  Patient's preferred pharmacy is:  [facility] [place], [place] [address] [place] [address] Phone: [phone] Fax: [phone]  Care Plan and Follow Up Patient Decision:  Patient agrees to Care Plan and Follow-up.   [name], PharmD Clinical Pharmacist [facility] at [facility] [phone]

## 2020-06-26 NOTE — Progress Notes (Signed)
Remote ICD transmission.   

## 2020-07-03 ENCOUNTER — Ambulatory Visit (INDEPENDENT_AMBULATORY_CARE_PROVIDER_SITE_OTHER): Payer: HMO

## 2020-07-03 ENCOUNTER — Other Ambulatory Visit: Payer: Self-pay

## 2020-07-03 DIAGNOSIS — Z0000 Encounter for general adult medical examination without abnormal findings: Secondary | ICD-10-CM

## 2020-07-03 NOTE — Patient Instructions (Signed)
Aaron Thompson , Thank you for taking time to come for your Medicare Wellness Visit. I appreciate your ongoing commitment to your health goals. Please review the following plan we discussed and let me know if I can assist you in the future.   Screening recommendations/referrals: Colonoscopy: Up to date, completed 10/26/2016, due 10/2026 Recommended yearly ophthalmology/optometry visit for glaucoma screening and checkup Recommended yearly dental visit for hygiene and checkup  Vaccinations: Influenza vaccine: Up to date, completed 11/20/2019, due 08/2020 Pneumococcal vaccine: Up to date, completed 09/30/2011, due at age 60  Tdap vaccine: Up to date, completed 07/23/2015, due 06/2025 Shingles vaccine: due, check with your insurance regarding coverage if interested    Covid-19: completed 3 vaccines   Advanced directives: Please bring a copy of your POA (Power of Attorney) and/or Living Will to your next appointment.   Conditions/risks identified: diabetes  Next appointment: Follow up in one year for your annual wellness visit.   Preventive Care 59-6 Years Old, Male Preventive care refers to lifestyle choices and visits with your health care provider that can promote health and wellness. What does preventive care include? A yearly physical exam. This is also called an annual well check. Dental exams once or twice a year. Routine eye exams. Ask your health care provider how often you should have your eyes checked. Personal lifestyle choices, including: Daily care of your teeth and gums. Regular physical activity. Eating a healthy diet. Avoiding tobacco and drug use. Limiting alcohol use. Practicing safe sex. Taking low doses of aspirin every day. Taking vitamin and mineral supplements as recommended by your health care provider. What happens during an annual well check? The services and screenings done by your health care provider during your annual well check will depend on your age, overall  health, lifestyle risk factors, and family history of disease. Counseling  Your health care provider may ask you questions about your: Alcohol use. Tobacco use. Drug use. Emotional well-being. Home and relationship well-being. Sexual activity. Eating habits. History of falls. Memory and ability to understand (cognition). Work and work Statistician. Screening  You may have the following tests or measurements: Height, weight, and BMI. Blood pressure. Lipid and cholesterol levels. These may be checked every 5 years, or more frequently if you are over 64 years old. Skin check. Lung cancer screening. You may have this screening every year starting at age 55 if you have a 30-pack-year history of smoking and currently smoke or have quit within the past 15 years. Fecal occult blood test (FOBT) of the stool. You may have this test every year starting at age 59. Flexible sigmoidoscopy or colonoscopy. You may have a sigmoidoscopy every 5 years or a colonoscopy every 10 years starting at age 64. Prostate cancer screening. Recommendations will vary depending on your family history and other risks. Hepatitis C blood test. Hepatitis B blood test. Sexually transmitted disease (STD) testing. Diabetes screening. This is done by checking your blood sugar (glucose) after you have not eaten for a while (fasting). You may have this done every 1-3 years. Abdominal aortic aneurysm (AAA) screening. You may need this if you are a current or former smoker. Osteoporosis. You may be screened starting at age 76 if you are at high risk. Talk with your health care provider about your test results, treatment options, and if necessary, the need for more tests. Vaccines  Your health care provider may recommend certain vaccines, such as: Influenza vaccine. This is recommended every year. Tetanus, diphtheria, and acellular pertussis (Tdap,  Td) vaccine. You may need a Td booster every 10 years. Zoster vaccine. You may  need this after age 59. Pneumococcal 13-valent conjugate (PCV13) vaccine. One dose is recommended after age 64. Pneumococcal polysaccharide (PPSV23) vaccine. One dose is recommended after age 59. Talk to your health care provider about which screenings and vaccines you need and how often you need them. This information is not intended to replace advice given to you by your health care provider. Make sure you discuss any questions you have with your health care provider. Document Released: 02/07/2015 Document Revised: 10/01/2015 Document Reviewed: 11/12/2014 Elsevier Interactive Patient Education  2017 [place] Prevention in the Home Falls can cause injuries. They can happen to people of all ages. There are many things you can do to make your home safe and to help prevent falls. What can I do on the outside of my home? Regularly fix the edges of walkways and driveways and fix any cracks. Remove anything that might make you trip as you walk through a door, such as a raised step or threshold. Trim any bushes or trees on the path to your home. Use bright outdoor lighting. Clear any walking paths of anything that might make someone trip, such as rocks or tools. Regularly check to see if handrails are loose or broken. Make sure that both sides of any steps have handrails. Any raised decks and porches should have guardrails on the edges. Have any leaves, snow, or ice cleared regularly. Use sand or salt on walking paths during winter. Clean up any spills in your garage right away. This includes oil or grease spills. What can I do in the bathroom? Use night lights. Install grab bars by the toilet and in the tub and shower. Do not use towel bars as grab bars. Use non-skid mats or decals in the tub or shower. If you need to sit down in the shower, use a plastic, non-slip stool. Keep the floor dry. Clean up any water that spills on the floor as soon as it happens. Remove soap buildup in  the tub or shower regularly. Attach bath mats securely with double-sided non-slip rug tape. Do not have throw rugs and other things on the floor that can make you trip. What can I do in the bedroom? Use night lights. Make sure that you have a light by your bed that is easy to reach. Do not use any sheets or blankets that are too big for your bed. They should not hang down onto the floor. Have a firm chair that has side arms. You can use this for support while you get dressed. Do not have throw rugs and other things on the floor that can make you trip. What can I do in the kitchen? Clean up any spills right away. Avoid walking on wet floors. Keep items that you use a lot in easy-to-reach places. If you need to reach something above you, use a strong step stool that has a grab bar. Keep electrical cords out of the way. Do not use floor polish or wax that makes floors slippery. If you must use wax, use non-skid floor wax. Do not have throw rugs and other things on the floor that can make you trip. What can I do with my stairs? Do not leave any items on the stairs. Make sure that there are handrails on both sides of the stairs and use them. Fix handrails that are broken or loose. Make sure that handrails are as  long as the stairways. Check any carpeting to make sure that it is firmly attached to the stairs. Fix any carpet that is loose or worn. Avoid having throw rugs at the top or bottom of the stairs. If you do have throw rugs, attach them to the floor with carpet tape. Make sure that you have a light switch at the top of the stairs and the bottom of the stairs. If you do not have them, ask someone to add them for you. What else can I do to help prevent falls? Wear shoes that: Do not have high heels. Have rubber bottoms. Are comfortable and fit you well. Are closed at the toe. Do not wear sandals. If you use a stepladder: Make sure that it is fully opened. Do not climb a closed  stepladder. Make sure that both sides of the stepladder are locked into place. Ask someone to hold it for you, if possible. Clearly [place] and make sure that you can see: Any grab bars or handrails. First and last steps. Where the edge of each step is. Use tools that help you move around (mobility aids) if they are needed. These include: Canes. Walkers. Scooters. Crutches. Turn on the lights when you go into a dark area. Replace any light bulbs as soon as they burn out. Set up your furniture so you have a clear path. Avoid moving your furniture around. If any of your floors are uneven, fix them. If there are any pets around you, be aware of where they are. Review your medicines with your doctor. Some medicines can make you feel dizzy. This can increase your chance of falling. Ask your doctor what other things that you can do to help prevent falls. This information is not intended to replace advice given to you by your health care provider. Make sure you discuss any questions you have with your health care provider. Document Released: 11/07/2008 Document Revised: 06/19/2015 Document Reviewed: 02/15/2014 Elsevier Interactive Patient Education  2017 [employer].

## 2020-07-03 NOTE — Progress Notes (Signed)
PCP notes:  Health Maintenance: Shingrix- due   Abnormal Screenings: none  Patient concerns: none   Nurse concerns: none   Next PCP appt.: none

## 2020-07-03 NOTE — Progress Notes (Signed)
Subjective:   Aaron Thompson is a 60 y.o. male who presents for Medicare Annual/Subsequent preventive examination.  Review of Systems: [name]      I connected with the patient today by telephone and verified that I am speaking with the correct person using two identifiers. Location patient: home Location nurse: work Persons participating in the telephone visit: patient, nurse.   I discussed the limitations, risks, security and privacy concerns of performing an evaluation and management service by telephone and the availability of in person appointments. I also discussed with the patient that there may be a patient responsible charge related to this service. The patient expressed understanding and verbally consented to this telephonic visit.        Cardiac Risk Factors include: advanced age (>74mn, >>75women);diabetes mellitus;male gender     Objective:    Today's Vitals   There is no height or weight on file to calculate BMI.  Advanced Directives 07/03/2020 06/06/2019 05/15/2019 10/26/2016 10/21/2016 10/14/2015 08/13/2015  Does Patient Have a Medical Advance Directive? Yes Yes Yes Yes No Yes No  Type of AParamedicof [place]orth College HillLiving will [place] will [place]Living will Living will - Living will;Healthcare Power of Attorney -  Does patient want to make changes to medical advance directive? - No - Patient declined - - - No - Patient declined -  Copy of [place] Chart? No - copy requested - No - copy requested No - copy requested No - copy requested Yes -  Would patient like information on creating a medical advance directive? - - - - - - No - patient declined information  Pre-existing out of facility DNR order (yellow form or pink MOST form) - - - - - - -    Current Medications (verified) Outpatient Encounter Medications as of 07/03/2020  Medication Sig   acetaminophen (TYLENOL) 325 MG tablet  Take 650 mg by mouth every 6 (six) hours as needed for mild pain or moderate pain.   ALPRAZolam (XANAX) 0.25 MG tablet TAKE 1 TABLET BY MOUTH AS NEEDED FOR ANXIETY   Blood Glucose Monitoring Suppl (ONE TOUCH ULTRA 2) w/Device KIT Use daily to check sugars as needed. Dx E11.9   carvedilol (COREG) 25 MG tablet TAKE 2 TABLETS(50 MG) BY MOUTH TWICE DAILY WITH A MEAL   dofetilide (TIKOSYN) 500 MCG capsule Take 1 capsule (500 mcg total) by mouth 2 (two) times daily.   empagliflozin (JARDIANCE) 25 MG TABS tablet Take 1 tablet (25 mg total) by mouth daily.   fluticasone (FLONASE) 50 MCG/ACT nasal spray Place 2 sprays into both nostrils daily.   furosemide (LASIX) 40 MG tablet Take 1 tablet (40 mg total) by mouth every Friday.   glipiZIDE (GLUCOTROL XL) 10 MG 24 hr tablet TAKE 1 TABLET(10 MG) BY MOUTH DAILY WITH BREAKFAST   glucose blood (ONE TOUCH ULTRA TEST) test strip CHECK GLUCOSE TWICE DAILY AND AS NEEDED, DX. E11.65 (UNCONTROLLED DM)   loratadine (CLARITIN) 10 MG tablet Take 10 mg by mouth daily as needed for allergies.   Magnesium Oxide 400 MG CAPS Take 1 capsule (400 mg total) by mouth daily.   metFORMIN (GLUCOPHAGE-XR) 500 MG 24 hr tablet TAKE 3 TABLETS(1500 MG) BY MOUTH DAILY WITH BREAKFAST   Multiple Vitamin (MULTIVITAMIN) tablet Take 1 tablet by mouth daily.   Omeprazole-Sodium Bicarbonate (ZEGERID) 20-1100 MG CAPS capsule Take 1 capsule by mouth 2 (two) times daily.   Pitavastatin Calcium (LIVALO) 1 MG  TABS TAKE 1 TABLET BY MOUTH DAILY IF TOLERATED   potassium chloride SA (KLOR-CON) 20 MEQ tablet Take 1 tablet (20 mEq total) by mouth 2 (two) times daily.   sacubitril-valsartan (ENTRESTO) 97-103 MG Take 1 tablet by mouth 2 (two) times daily. Pt need to keep upcoming appt in June for further refills   spironolactone (ALDACTONE) 25 MG tablet TAKE 1 TABLET(25 MG) BY MOUTH DAILY   traMADol (ULTRAM) 50 MG tablet TAKE 1 TABLET(50 MG) BY MOUTH EVERY 12 HOURS AS NEEDED FOR PAIN   XARELTO 20 MG TABS  tablet TAKE 1 TABLET(20 MG) BY MOUTH DAILY WITH SUPPER   No facility-administered encounter medications on file as of 07/03/2020.    Allergies (verified) Atorvastatin   History: Past Medical History:  Diagnosis Date   AICD (automatic cardioverter/defibrillator) present    Anxiety state, unspecified    Calculus of kidney 1981   "passed it on my own"   Chronic systolic CHF (congestive heart failure) ([place])    Dermatophytosis of the body    DJD (degenerative joint disease)    Esophageal reflux    Hx of colonic polyps    Hypertension    LBBB (left bundle branch block)    intermittent   Non-ischemic cardiomyopathy ([place])    a. cath 2013: minor nonobstructive CAD.   OSA (obstructive sleep apnea)    he can't tolerate CPAP.    Osteoarthrosis, unspecified whether generalized or localized, hand    Other chronic nonalcoholic liver disease    fatty liver   PAF (paroxysmal atrial fibrillation) ([place])    a. s/p DCCV 6/11; previously on Pradaxa;  b. Event Monitor 2012->No PAF;  c. 09/2011 s/p DCCV ->Xarelto started. d. 03/2014: inappropriate ICD shocks for AF-RVR, started on Tikosyn, Xarelto restarted.   Type II diabetes mellitus ([place])    Unspecified hearing loss    no hearing aid   Past Surgical History:  Procedure Laterality Date   Abdominal US  01/2007   Fatty liver, no gallstones   APPENDECTOMY     ATRIAL TACH ABLATION  09/2015   BI-VENTRICULAR IMPLANTABLE CARDIOVERTER DEFIBRILLATOR [name] 02/14/2013   STJ CRTD implanted by Dr Caryl Comes   BILATERAL KNEE ARTHROSCOPY     BIV ICD GENERATOR CHANGEOUT [name] 06/06/2019   Procedure: BIV ICD GENERATOR CHANGEOUT;  Surgeon: Deboraha Sprang, MD;  Location: [place];  Service: Cardiovascular;  Laterality: [name];   CARDIOVERSION  09/29/2011   Procedure: CARDIOVERSION;  Surgeon: Thayer Headings, MD;  Location: [place];  Service: Cardiovascular;  Laterality: [name];   COLONOSCOPY WITH PROPOFOL [name] 10/26/2016   Procedure: COLONOSCOPY WITH PROPOFOL;  Surgeon:  Irene Shipper, MD;  Location: [facility];  Service: Endoscopy;  Laterality: [name];   DOPPLER ECHOCARDIOGRAPHY  11/2009   Decreased EF of 35-40%   ELECTROPHYSIOLOGIC STUDY [name] 10/14/2015   Procedure: Atrial Fibrillation Ablation;  Surgeon: [name], MD;  Location: [place] [facility];  Service: Cardiovascular;  Laterality: [name];   LEFT HEART CATHETERIZATION WITH CORONARY ANGIOGRAM [name] 08/18/2011   Procedure: LEFT HEART CATHETERIZATION WITH CORONARY ANGIOGRAM;  Surgeon: Peter M Martinique, MD;  Location: [facility];  Service: Cardiovascular;  Laterality: [name];   NECK SURGERY     plate/fusion   Stress Cardiolite  08/2005   Normal, EF 42%   UPPER GASTROINTESTINAL ENDOSCOPY  08/2006   GERD   Family History  Problem Relation Age of Onset   Hypertension Mother    Diabetes Mother    Heart failure Mother  Died @ 57   Hypertension Father    Heart failure Father        Died @ 65   Prostate cancer Father    Diabetes Brother    Kidney disease Brother    Diabetes Brother    Prostate cancer Brother    Diabetes Brother    Other Sister    Heart attack Paternal Grandfather    Stroke Paternal Grandmother    Colon cancer Paternal Grandmother    Multiple myeloma Sister    Leukemia Sister    Colon polyps Sister    Cirrhosis Maternal Uncle        alcohol related   Esophageal cancer Neg Hx    Stomach cancer Neg Hx    Pancreatic cancer Neg Hx    Social History   Socioeconomic History   Marital status: Married    Spouse name: Not on file   Number of children: 2   Years of education: Not on file   Highest education level: Not on file  Occupational History   Occupation: Banker: UNEMPLOYED  Tobacco Use   Smoking status: Never   Smokeless tobacco: Former    Types: Snuff    Quit date: 10/12/2004   Tobacco comments:    02/14/2013 "quit snuff in 2006"  Vaping Use   Vaping Use: Never used  Substance and Sexual Activity   Alcohol use: No    Alcohol/week: 0.0 standard drinks    Drug use: No   Sexual activity: Yes  Other Topics Concern   Not on file  Social History Narrative   Lives in [place] with wife.  Works @ [employer] in [employer].   Married 1990   Social Determinants of Radio broadcast assistant Strain: Low Risk    Difficulty of Paying Living Expenses: Not hard at all  Food Insecurity: No Food Insecurity   Worried About Charity fundraiser in the Last Year: Never true   Arboriculturist in the Last Year: Never true  Transportation Needs: No Transportation Needs   Lack of Transportation (Medical): No   Lack of Transportation (Non-Medical): No  Physical Activity: Insufficiently Active   Days of Exercise per Week: 7 days   Minutes of Exercise per Session: 20 min  Stress: No Stress Concern Present   Feeling of Stress : Not at all  Social Connections: Not on file    Tobacco Counseling Counseling given: Not Answered Tobacco comments: 02/14/2013 "quit snuff in 2006"   Clinical Intake:  Pre-visit preparation completed: Yes  Pain : No/denies pain     Nutritional Risks: None Diabetes: Yes CBG done?: No Did pt. bring in CBG monitor from home?: No  How often do you need to have someone help you when you read instructions, pamphlets, or other written materials from your doctor or pharmacy?: 1 - Never  Diabetic: Yes Nutrition Risk Assessment:  Has the patient had any N/V/D within the last 2 months?  No  Does the patient have any non-healing wounds?  No  Has the patient had any unintentional weight loss or weight gain?  No   Diabetes:  Is the patient diabetic?  Yes  If diabetic, was a CBG obtained today?  No virtual/telephone visit  Did the patient bring in their glucometer from home?  No  virtual/telephone visit  How often do you monitor your CBG's? daily.   Financial Strains and Diabetes Management:  Are you having any financial strains with the device, your  supplies or your medication? No .  Does the patient want to be  seen by Chronic Care Management for management of their diabetes?  Yes  Would the patient like to be referred to a Nutritionist or for Diabetic Management?  No   Diabetic Exams:  Diabetic Eye Exam: Completed 04/07/2020 Diabetic Foot Exam: Completed 05/27/2020   Interpreter Needed?: No  Information entered by :: CJohnson, LPN   Activities of Daily Living In your present state of health, do you have any difficulty performing the following activities: 07/03/2020  Hearing? Y  Vision? N  Difficulty concentrating or making decisions? N  Walking or climbing stairs? N  Dressing or bathing? N  Doing errands, shopping? N  Preparing Food and eating ? N  Using the Toilet? N  In the past six months, have you accidently leaked urine? N  Do you have problems with loss of bowel control? N  Managing your Medications? N  Managing your Finances? N  Housekeeping or managing your Housekeeping? N  Some recent data might be hidden    Patient Care Team: [name], MD as PCP - General (Family Medicine) [name], MD as PCP - Cardiology (Cardiology) [name], [facility] as Pharmacist (Pharmacist)  Indicate any recent Medical Services you may have received from other than [facility] providers in the past year (date may be approximate).     Assessment:   This is a routine wellness examination for [employer].  Hearing/Vision screen Vision Screening - Comments:: Patient gets annual eye exams   Dietary issues and exercise activities discussed: Current Exercise Habits: Home exercise routine, Type of exercise: walking, Time (Minutes): 20, Frequency (Times/Week): 7, Weekly Exercise (Minutes/Week): 140, Intensity: Moderate, Exercise limited by: None identified   Goals Addressed             This Visit's Progress    Patient Stated       07/03/2020, I will continue to walk daily for about 20 minutes.        Depression Screen PHQ 2/9 Scores 07/03/2020 06/21/2019 05/15/2019 12/01/2018 07/05/2017  PHQ -  2 Score 0 0 0 0 0  PHQ- 9 Score 0 - 0 0 -    Fall Risk Fall Risk  07/03/2020 05/15/2019  Falls in the past year? 0 1  Comment - tripped on step  Number falls in past yr: 0 0  Injury with Fall? 0 0  Risk for fall due to : Medication side effect Medication side effect  Follow up Falls evaluation completed;Falls prevention discussed Falls evaluation completed;Falls prevention discussed    FALL RISK PREVENTION PERTAINING TO THE HOME:  Any stairs in or around the home? Yes  If so, are there any without handrails? No  Home free of loose throw rugs in walkways, pet beds, electrical cords, etc? Yes  Adequate lighting in your home to reduce risk of falls? Yes   ASSISTIVE DEVICES UTILIZED TO PREVENT FALLS:  Life alert? No  Use of a cane, walker or w/c? No  Grab bars in the bathroom? No  Shower chair or bench in shower? No  Elevated toilet seat or a handicapped toilet? No   TIMED UP AND GO:  Was the test performed?  [name] virtual/telephone visit .   Cognitive Function: MMSE - Mini Mental State Exam 07/03/2020 05/15/2019  Not completed: Refused -  Orientation to time - 5  Orientation to Place - 5  Registration - 3  Attention/ Calculation - 5  Recall - 3  Language- repeat -  1  Mini Cog  Mini-Cog screen was not completed. Wanted to skip this. Maximum score is 22. A value of 0 denotes this part of the MMSE was not completed or the patient failed this part of the Mini-Cog screening.       Immunizations Immunization History  Administered Date(s) Administered   Influenza, Seasonal, Injecte, Preservative Fre 12/03/2013   Influenza,inj,Quad PF,6+ Mos 10/05/2016, 11/04/2017, 12/01/2018   Influenza-Unspecified 12/09/2012, 11/24/2014, 11/20/2019   Moderna Sars-Covid-2 Vaccination 05/13/2019, 06/12/2019, 10/04/2019   Pneumococcal Polysaccharide-23 09/30/2011   Td 07/23/2015    TDAP status: Up to date  Flu Vaccine status: Up to date  Pneumococcal vaccine status: Up to date  Covid-19  vaccine status: Completed vaccines  Qualifies for Shingles Vaccine? Yes   Zostavax completed No   Shingrix Completed?: Yes  Screening Tests Health Maintenance  Topic Date Due   Pneumococcal Vaccine 46-41 Years old (1 - PCV) Never done   Zoster Vaccines- Shingrix (1 of 2) Never done   COVID-19 Vaccine (4 - Booster for Moderna series) 01/03/2020   INFLUENZA VACCINE  08/25/2020   HEMOGLOBIN A1C  11/27/2020   OPHTHALMOLOGY EXAM  04/07/2021   FOOT EXAM  05/27/2021   TETANUS/TDAP  07/22/2025   COLONOSCOPY (Pts 45-5yr Insurance coverage will need to be confirmed)  10/27/2026   PNEUMOCOCCAL POLYSACCHARIDE VACCINE AGE 48-64 HIGH RISK  Completed   Hepatitis C Screening  Completed   HIV Screening  Completed   HPV VACCINES  Aged Out    Health Maintenance  Health Maintenance Due  Topic Date Due   Pneumococcal Vaccine 056682Years old (1 - PCV) Never done   Zoster Vaccines- Shingrix (1 of 2) Never done   COVID-19 Vaccine (4 - Booster for Moderna series) 01/03/2020    Colorectal cancer screening: Type of screening: Colonoscopy. Completed 10/26/2016. Repeat every 10 years  Lung Cancer Screening: (Low Dose CT Chest recommended if Age 59-80years, 30 pack-year currently smoking OR have quit w/in 15 years.) does not qualify.    Additional Screening:  Hepatitis C Screening: does qualify; Completed 06/23/2016  Vision Screening: Recommended annual ophthalmology exams for early detection of glaucoma and other disorders of the eye. Is the patient up to date with their annual eye exam?  Yes  Who is the provider or what is the name of the office in which the patient attends annual eye exams? [facility] If pt is not established with a provider, would they like to be referred to a provider to establish care? No .   Dental Screening: Recommended annual dental exams for proper oral hygiene  Community Resource Referral / Chronic Care Management: CRR required this visit?  No   CCM required this  visit?  No      Plan:     I have personally reviewed and noted the following in the patient's chart:   Medical and social history Use of alcohol, tobacco or illicit drugs  Current medications and supplements including opioid prescriptions. Patient is not currently taking opioid prescriptions. Functional ability and status Nutritional status Physical activity Advanced directives List of other physicians Hospitalizations, surgeries, and ER visits in previous 12 months Vitals Screenings to include cognitive, depression, and falls Referrals and appointments  In addition, I have reviewed and discussed with patient certain preventive protocols, quality metrics, and best practice recommendations. A written personalized care plan for preventive services as well as general preventive health recommendations were provided to patient.   Due to this being a virtual/telephonic visit, the after  visit summary with patients personalized plan was offered to patient via office or my-chart. Patient preferred to pick up at office at next visit or via mychart.   Andrez Grime, LPN   09/30/4381

## 2020-07-07 ENCOUNTER — Ambulatory Visit (HOSPITAL_COMMUNITY)
Admission: RE | Admit: 2020-07-07 | Discharge: 2020-07-07 | Disposition: A | Discharge status: Home / Self Care | Payer: HMO | Source: Ambulatory Visit | Attending: Internal Medicine | Admitting: Internal Medicine

## 2020-07-07 ENCOUNTER — Other Ambulatory Visit: Payer: Self-pay

## 2020-07-07 ENCOUNTER — Encounter (HOSPITAL_COMMUNITY): Payer: Self-pay | Admitting: Internal Medicine

## 2020-07-07 VITALS — BP 124/89 | HR 83 | Wt 286.4 lb

## 2020-07-07 DIAGNOSIS — I480 Paroxysmal atrial fibrillation: Secondary | ICD-10-CM | POA: Insufficient documentation

## 2020-07-07 DIAGNOSIS — Z87891 Personal history of nicotine dependence: Secondary | ICD-10-CM | POA: Insufficient documentation

## 2020-07-07 DIAGNOSIS — Z7901 Long term (current) use of anticoagulants: Secondary | ICD-10-CM | POA: Insufficient documentation

## 2020-07-07 DIAGNOSIS — I255 Ischemic cardiomyopathy: Secondary | ICD-10-CM | POA: Insufficient documentation

## 2020-07-07 DIAGNOSIS — I493 Ventricular premature depolarization: Secondary | ICD-10-CM | POA: Diagnosis not present

## 2020-07-07 DIAGNOSIS — I5022 Chronic systolic (congestive) heart failure: Secondary | ICD-10-CM | POA: Insufficient documentation

## 2020-07-07 DIAGNOSIS — Z79899 Other long term (current) drug therapy: Secondary | ICD-10-CM | POA: Insufficient documentation

## 2020-07-07 DIAGNOSIS — G4733 Obstructive sleep apnea (adult) (pediatric): Secondary | ICD-10-CM | POA: Insufficient documentation

## 2020-07-07 DIAGNOSIS — I110 Hypertensive heart disease with heart failure: Secondary | ICD-10-CM | POA: Diagnosis not present

## 2020-07-07 DIAGNOSIS — I2510 Atherosclerotic heart disease of native coronary artery without angina pectoris: Secondary | ICD-10-CM | POA: Insufficient documentation

## 2020-07-07 DIAGNOSIS — Z7984 Long term (current) use of oral hypoglycemic drugs: Secondary | ICD-10-CM | POA: Insufficient documentation

## 2020-07-07 DIAGNOSIS — Z560 Unemployment, unspecified: Secondary | ICD-10-CM | POA: Diagnosis not present

## 2020-07-07 DIAGNOSIS — Z8249 Family history of ischemic heart disease and other diseases of the circulatory system: Secondary | ICD-10-CM | POA: Diagnosis not present

## 2020-07-07 DIAGNOSIS — I428 Other cardiomyopathies: Secondary | ICD-10-CM | POA: Insufficient documentation

## 2020-07-07 DIAGNOSIS — E119 Type 2 diabetes mellitus without complications: Secondary | ICD-10-CM | POA: Insufficient documentation

## 2020-07-07 LAB — BASIC METABOLIC PANEL
Anion gap: 12 (ref 5–15)
BUN: 16 mg/dL (ref 6–20)
CO2: 26 mmol/L (ref 22–32)
Calcium: 9.5 mg/dL (ref 8.9–10.3)
Chloride: 100 mmol/L (ref 98–111)
Creatinine, Ser: 0.66 mg/dL (ref 0.61–1.24)
GFR, Estimated: >60 mL/min (ref >60)
Glucose, Bld: 141 mg/dL — ABNORMAL HIGH (ref 70–99)
Potassium: 4.3 mmol/L (ref 3.5–5.1)
Sodium: 138 mmol/L (ref 135–145)

## 2020-07-07 LAB — MAGNESIUM: Magnesium: 2.5 mg/dL — ABNORMAL HIGH (ref 1.7–2.4)

## 2020-07-07 MED ORDER — GLIPIZIDE ER 10 MG PO TB24: ORAL_TABLET | ORAL | 5 refills | Status: DC

## 2020-07-07 NOTE — Progress Notes (Signed)
ADVANCED HF CLINIC NOTE  Referring Physician: Dr. [name]  Primary Cardiologist: Dr. [name]  HPI:  Aaron Thompson is a 60 y.o. male with PAF s/p AF ablation , HTN, morbid obesity, OSA unable to tolerate CPAP, DM, LBBB and chronic systolic HF (EF 95-28%) due to NICM s/p STJ CRT-D (cath 2013 minimal CAD)  referred by Dr. [name] for HF evaluation   We have not seen him since 12/19. He presents for f/u.   Diagnosed with systolic HF in 4132. Cath at that time minimal CAD. Had STJ CRT-D placed.   Presented to ER with new-onset AF 7/17.Had DCCV in ER.  Admitted the same month with ERAF. Felt to to have both AFib and ATach.  Subsequently seen by Dr. Rayann Heman and had afib ablation in 9/17. Subsequently had recurrent AF with RVR leading to ICD shocks. Placed back on tikosyn and anticoagulation.   Echo 12/14/16 EF 20-25% Last echo 1/20 EF 25-30%   CRT-D gen change 5/21  Saw Dr. Caryl Comes in 1/22 and being evaluated for symptomatic PVCs. On ICD interrogation in 2/22 burden was 1.3%  Here for f/u. Feels good. Playing golf with his buddies without a problem. Went to [employer] and was able to exercise for 30 mins without a problem. No edema, orthopnea or PND. No palpitations  ICD interrogation in clinic today. No Vt/VF.  AF burden < 1% BiV pacing 97% Volume status ok. Personally reviewed    Past Medical History:  Diagnosis Date   AICD (automatic cardioverter/defibrillator) present    Anxiety state, unspecified    Calculus of kidney 1981   "passed it on my own"   Chronic systolic CHF (congestive heart failure) ([place])    Dermatophytosis of the body    DJD (degenerative joint disease)    Esophageal reflux    Hx of colonic polyps    Hypertension    LBBB (left bundle branch block)    intermittent   Non-ischemic cardiomyopathy ([place])    a. cath 2013: minor nonobstructive CAD.   OSA (obstructive sleep apnea)    he can't tolerate CPAP.    Osteoarthrosis, unspecified whether generalized or  localized, hand    Other chronic nonalcoholic liver disease    fatty liver   PAF (paroxysmal atrial fibrillation) ([place])    a. s/p DCCV 6/11; previously on Pradaxa;  b. Event Monitor 2012->No PAF;  c. 09/2011 s/p DCCV ->Xarelto started. d. 03/2014: inappropriate ICD shocks for AF-RVR, started on Tikosyn, Xarelto restarted.   Type II diabetes mellitus (HCC)    Unspecified hearing loss    no hearing aid    Current Outpatient Medications  Medication Sig Dispense Refill   acetaminophen (TYLENOL) 325 MG tablet Take 650 mg by mouth every 6 (six) hours as needed for mild pain or moderate pain.     ALPRAZolam (XANAX) 0.25 MG tablet TAKE 1 TABLET BY MOUTH AS NEEDED FOR ANXIETY 30 tablet 1   Blood Glucose Monitoring Suppl (ONE TOUCH ULTRA 2) w/Device KIT Use daily to check sugars as needed. Dx E11.9 1 kit 0   carvedilol (COREG) 25 MG tablet TAKE 2 TABLETS(50 MG) BY MOUTH TWICE DAILY WITH A MEAL 360 tablet 3   dofetilide (TIKOSYN) 500 MCG capsule Take 1 capsule (500 mcg total) by mouth 2 (two) times daily. 180 capsule 2   empagliflozin (JARDIANCE) 25 MG TABS tablet Take 1 tablet (25 mg total) by mouth daily. 90 tablet 1   furosemide (LASIX) 40 MG tablet Take 1 tablet (40 mg total)  by mouth every Friday. 5 tablet 11   glipiZIDE (GLUCOTROL XL) 10 MG 24 hr tablet TAKE 1 TABLET(10 MG) BY MOUTH DAILY WITH BREAKFAST 30 tablet 5   glucose blood (ONE TOUCH ULTRA TEST) test strip CHECK GLUCOSE TWICE DAILY AND AS NEEDED, DX. E11.65 (UNCONTROLLED DM) 100 each 5   loratadine (CLARITIN) 10 MG tablet Take 10 mg by mouth daily as needed for allergies.     Magnesium Oxide 400 MG CAPS Take 1 capsule (400 mg total) by mouth daily. 90 capsule 3   metFORMIN (GLUCOPHAGE-XR) 500 MG 24 hr tablet TAKE 3 TABLETS(1500 MG) BY MOUTH DAILY WITH BREAKFAST 90 tablet 5   Multiple Vitamin (MULTIVITAMIN) tablet Take 1 tablet by mouth daily.     Omeprazole-Sodium Bicarbonate (ZEGERID) 20-1100 MG CAPS capsule Take 1 capsule by mouth 2 (two)  times daily.     Pitavastatin Calcium (LIVALO) 1 MG TABS TAKE 1 TABLET BY MOUTH DAILY IF TOLERATED     potassium chloride SA (KLOR-CON) 20 MEQ tablet Take 1 tablet (20 mEq total) by mouth 2 (two) times daily.     sacubitril-valsartan (ENTRESTO) 97-103 MG Take 1 tablet by mouth 2 (two) times daily. Pt need to keep upcoming appt in June for further refills 180 tablet 2   spironolactone (ALDACTONE) 25 MG tablet TAKE 1 TABLET(25 MG) BY MOUTH DAILY 90 tablet 0   traMADol (ULTRAM) 50 MG tablet TAKE 1 TABLET(50 MG) BY MOUTH EVERY 12 HOURS AS NEEDED FOR PAIN 60 tablet 2   XARELTO 20 MG TABS tablet TAKE 1 TABLET(20 MG) BY MOUTH DAILY WITH SUPPER 30 tablet 5   No current facility-administered medications for this encounter.    Allergies  Allergen Reactions   Atorvastatin     Muscle pain       Social History   Socioeconomic History   Marital status: Married    Spouse name: Not on file   Number of children: 2   Years of education: Not on file   Highest education level: Not on file  Occupational History   Occupation: PAINTER    Employer: UNEMPLOYED  Tobacco Use   Smoking status: Never   Smokeless tobacco: Former    Types: Snuff    Quit date: 10/12/2004   Tobacco comments:    02/14/2013 "quit snuff in 2006"  Vaping Use   Vaping Use: Never used  Substance and Sexual Activity   Alcohol use: No    Alcohol/week: 0.0 standard drinks   Drug use: No   Sexual activity: Yes  Other Topics Concern   Not on file  Social History Narrative   Lives in [place] with wife.  Works @ [employer] in [employer].   Married 1990   Social Determinants of Radio broadcast assistant Strain: Low Risk    Difficulty of Paying Living Expenses: Not hard at all  Food Insecurity: No Food Insecurity   Worried About Charity fundraiser in the Last Year: Never true   Arboriculturist in the Last Year: Never true  Transportation Needs: No Transportation Needs   Lack of Transportation (Medical): No   Lack  of Transportation (Non-Medical): No  Physical Activity: Insufficiently Active   Days of Exercise per Week: 7 days   Minutes of Exercise per Session: 20 min  Stress: No Stress Concern Present   Feeling of Stress : Not at all  Social Connections: Not on file  Intimate Partner Violence: Not At Risk   Fear of Current or Ex-Partner: No  Emotionally Abused: No   Physically Abused: No   Sexually Abused: No      Family History  Problem Relation Age of Onset   Hypertension Mother    Diabetes Mother    Heart failure Mother        Died @ 63   Hypertension Father    Heart failure Father        Died @ 33   Prostate cancer Father    Diabetes Brother    Kidney disease Brother    Diabetes Brother    Prostate cancer Brother    Diabetes Brother    Other Sister    Heart attack Paternal Grandfather    Stroke Paternal Grandmother    Colon cancer Paternal Grandmother    Multiple myeloma Sister    Leukemia Sister    Colon polyps Sister    Cirrhosis Maternal Uncle        alcohol related   Esophageal cancer Neg Hx    Stomach cancer Neg Hx    Pancreatic cancer Neg Hx     Vitals:   07/07/20 1006  BP: 124/89  Pulse: 83  SpO2: 95%  Weight: 129.9 kg (286 lb 6.4 oz)    PHYSICAL EXAM: General:  Well appearing. No resp difficulty HEENT: normal Neck: supple. no JVD. Carotids 2+ bilat; no bruits. No lymphadenopathy or thryomegaly appreciated. Cor: PMI nondisplaced. Regular rate & rhythm. No rubs, gallops or murmurs. Lungs: clear Abdomen: soft, nontender, nondistended. No hepatosplenomegaly. No bruits or masses. Good bowel sounds. Extremities: no cyanosis, clubbing, rash, edema Neuro: alert & orientedx3, cranial nerves grossly intact. moves all 4 extremities w/o difficulty. Affect pleasant   ECG: NSR with v-pacing 80 QTS 528 ms (QRS is 132 so JT is 396)Personally reviewed  ASSESSMENT & PLAN: 1. Chronic systolic failure due to NICM s/p STJ CRT-D - cath 2013 non-obstructive CAD - Echo  11/18 EF 20-25%  - Echo 1/20 EF 25-30% - Echo 3/22 EF 25-30% RV mildly down.  - Doing well NYHA II  - Continue lasix 40 + kcl 20 on every Friday - ICD interrogated as above - On goal-dose HF meds: Entresto 97/103 bid, carvedilol 25 bid, spiro 25 daily. + Jardiance 13m daily  - Labs today check K and Mg - If symptoms worsen can consider Barostim or Anthm  2. PAF - s/p AF ablation - maintaining NSR on Tikosyn. [facility] Dr. KCaryl Comes- QT mildly prolonged but JT ok (no change from previous) - No bleeding on Xarelto   3. OSA - unable to tolerate CPAP - weight loss recommended.   4. DM2  - Continue Jardiance 25 - continue statin and Entresto  5. PVCs - ICD interrogation with 1.3% burden 2/22 - PVC burden 1.2% on ICD today   DGlori Bickers MD  10:29 AM    .

## 2020-07-07 NOTE — Patient Instructions (Signed)
Take Furosemide 40 mg Every Friday  Labs done today, your results will be available in MyChart, we will contact you for abnormal readings.  Please call our office in February 2023 to schedule your follow up appointment  If you have any questions or concerns before your next appointment please send [place] a message through [place] or call our office at [phone].    TO LEAVE A MESSAGE FOR THE NURSE SELECT OPTION 2, PLEASE LEAVE A MESSAGE INCLUDING: YOUR NAME DATE OF BIRTH CALL BACK NUMBER REASON FOR CALL**this is important as we prioritize the call backs  YOU WILL RECEIVE A CALL BACK THE SAME DAY AS LONG AS YOU CALL BEFORE 4:00 PM  At the [facility], you and your health needs are our priority. As part of our continuing mission to provide you with exceptional heart care, we have created designated Provider Care Teams. These Care Teams include your primary Cardiologist (physician) and Advanced Practice Providers (APPs- Physician Assistants and Nurse Practitioners) who all work together to provide you with the care you need, when you need it.   You may see any of the following providers on your designated Care Team at your next follow up: Dr Glori Bickers Dr Loralie Champagne Dr Patrice Paradise, NP Lyda Jester, [place] Ginnie Smart Audry Riles, PharmD   Please be sure to bring in all your medications bottles to every appointment.

## 2020-07-07 NOTE — Addendum Note (Signed)
Encounter addended by: Scarlette Calico, RN on: 07/07/2020 10:49 AM  Actions taken: Order list changed, Diagnosis association updated, Clinical Note Signed, Charge Capture section accepted

## 2020-07-09 ENCOUNTER — Telehealth: Payer: Self-pay

## 2020-07-09 NOTE — Telephone Encounter (Signed)
Chronic Care Management Pharmacy Note  06/25/2020 Name:  Aaron Thompson MRN:  [id number] DOB:  10/20/1960  Subjective: Aaron Thompson is an 60 y.o. year old male who is a primary patient of [name], [name], MD.  The CCM team was consulted for assistance with disease management and care coordination needs.    Attempted to reach patient by telephone for follow up visit in response to provider referral for pharmacy case management and/or care coordination services. Unable to reach patient.   Consent to Services:  The patient was given information about Chronic Care Management services, agreed to services, and gave verbal consent prior to initiation of services.  Please see initial visit note for detailed documentation.   Patient Care Team: [name], MD as PCP - General (Family Medicine) [name], MD as PCP - Cardiology (Cardiology) [name], [facility] as Pharmacist (Pharmacist)  Last CCM 05/15/20 - Summary: Apply for Xarelto, Entresto PAP.   Recent office visits: 05/27/20 - Dr. [name], PCP - Lipid panel, A1c, CMP. He had been taking 2 potassium every day.  Recheck lab pending. Taking lasix on Friday.   Taking pitavastatin daily, has tolerated that dose.  No medication changes.  Your potassium is still normal.  Your lipids improved and your A1c is down to 7.2.  I would continue as is and recheck in about 3 months   Recent consult visits: 04/30/20 - Telephone, [name] PA approved 04/25/20 - Cardiology - AFIB, CHF, continue current meds, RTC 6 months 04/01/20 - Echocardiogram - 25-30% 03/31/20 - Cardiology, CHF Clinic - Add Lasix 40 mg + KCl 20 on every Friday, repeat echo, see back in 3-4 months.   Hospital visits: None in previous 6 months  Objective:  Lab Results  Component Value Date   CREATININE 0.66 07/07/2020   BUN 16 07/07/2020   GFR 101.06 05/27/2020   GFRNONAA >60 07/07/2020   GFRAA 117 09/10/2019   NA 138 07/07/2020   K 4.3 07/07/2020   CALCIUM 9.5  07/07/2020   CO2 26 07/07/2020   GLUCOSE 141 (H) 07/07/2020    Lab Results  Component Value Date/Time   HGBA1C 7.2 (H) 05/27/2020 12:22 PM   HGBA1C 7.8 (A) 02/28/2020 11:34 AM   HGBA1C 7.5 (H) 06/21/2019 09:45 AM   GFR 101.06 05/27/2020 12:22 PM   GFR 108.76 06/21/2019 09:45 AM   MICROALBUR 1.7 10/09/2010 11:36 AM    Last diabetic Eye exam:  Lab Results  Component Value Date/Time   HMDIABEYEEXA No Retinopathy 04/07/2020 12:00 AM    Last diabetic Foot exam: 06/21/19 normal   Lab Results  Component Value Date   CHOL 162 05/27/2020   HDL 34.60 (L) 05/27/2020   LDLCALC 100 (H) 05/27/2020   LDLDIRECT 97.0 06/16/2016   TRIG 140.0 05/27/2020   CHOLHDL 5 05/27/2020   Hepatic Function Latest Ref Rng & Units 05/27/2020 06/21/2019 04/06/2019  Total Protein 6.0 - 8.3 g/dL 6.8 7.0 6.6  Albumin 3.5 - 5.2 g/dL 4.3 4.5 4.2  AST 0 - 37 U/L '17 19 14  ' ALT 0 - 53 U/L '17 19 17  ' Alk Phosphatase 39 - 117 U/L 44 50 52  Total Bilirubin 0.2 - 1.2 mg/dL 0.5 0.6 0.6  Bilirubin, Direct 0.0 - 0.3 mg/dL - - -   Last echo 1/20 EF 25-30%  Lab Results  Component Value Date/Time   TSH 2.09 07/05/2017 08:39 AM   TSH 1.380 12/14/2016 02:29 PM   FREET4 0.90 07/29/2015 06:44 AM  CBC Latest Ref Rng & Units 06/01/2019 04/06/2019 12/14/2016  WBC 3.4 - 10.8 x10E3/uL 6.1 7.1 8.2  Hemoglobin 13.0 - 17.7 g/dL 15.9 15.7 15.3  Hematocrit 37.5 - 51.0 % 47.0 46.7 46.1  Platelets 150 - 450 x10E3/uL 178 180.0 215   No results found for: VD25OH  Clinical ASCVD: No  The 10-year ASCVD risk score [name], et al., 2013) is: 16.1%   Values used to calculate the score:     Age: 75 years     Sex: Male     Is Non-Hispanic African American: No     Diabetic: Yes     Tobacco smoker: No     Systolic Blood Pressure: 947 mmHg     Is BP treated: Yes     HDL Cholesterol: 34.6 mg/dL     Total Cholesterol: 162 mg/dL    Depression screen [facility] 2/9 07/03/2020 06/21/2019 05/15/2019  Decreased Interest 0 0 0  Down, Depressed,  Hopeless 0 0 0  PHQ - 2 Score 0 0 0  Altered sleeping 0 - 0  Tired, decreased energy 0 - 0  Change in appetite 0 - 0  Feeling bad or failure about yourself  0 - 0  Trouble concentrating 0 - 0  Moving slowly or fidgety/restless 0 - 0  Suicidal thoughts 0 - 0  PHQ-9 Score 0 - 0  Difficult doing work/chores Not difficult at all - Not difficult at all  Some recent data might be hidden    Social History   Tobacco Use  Smoking Status Never  Smokeless Tobacco Former   Types: Snuff   Quit date: 10/12/2004  Tobacco Comments   02/14/2013 "quit snuff in 2006"   BP Readings from Last 3 Encounters:  07/07/20 124/89  05/27/20 116/68  04/25/20 110/72   Pulse Readings from Last 3 Encounters:  07/07/20 83  05/27/20 81  04/25/20 83   Wt Readings from Last 3 Encounters:  07/07/20 286 lb 6.4 oz (129.9 kg)  05/27/20 286 lb (129.7 kg)  04/25/20 288 lb (130.6 kg)   BMI Readings from Last 3 Encounters:  07/07/20 34.86 kg/m  05/27/20 34.81 kg/m  04/25/20 35.06 kg/m    Assessment/Interventions: Review of patient past medical history, allergies, medications, health status, including review of consultants reports, laboratory and other test data, was performed as part of comprehensive evaluation and provision of chronic care management services.   SDOH:  (Social Determinants of Health) assessments and interventions performed: No  SDOH Screenings   Alcohol Screen: Low Risk    Last Alcohol Screening Score (AUDIT): 0  Depression (PHQ2-9): Low Risk    PHQ-2 Score: 0  Financial Resource Strain: Low Risk    Difficulty of Paying Living Expenses: Not hard at all  Food Insecurity: No Food Insecurity   Worried About Charity fundraiser in the Last Year: Never true   Ran Out of Food in the Last Year: Never true  Housing: Low Risk    Last Housing Risk Score: 0  Physical Activity: Insufficiently Active   Days of Exercise per Week: 7 days   Minutes of Exercise per Session: 20 min  Social  Connections: Not on file  Stress: No Stress Concern Present   Feeling of Stress : Not at all  Tobacco Use: Medium Risk   Smoking Tobacco Use: Never   Smokeless Tobacco Use: Former  Transport planner Needs: No Data processing manager (Medical): No   Lack of Transportation (Non-Medical): No  CCM Care Plan  Allergies  Allergen Reactions   Atorvastatin     Muscle pain     Medications Reviewed Today     Reviewed by Drake Leach, RN (Registered Nurse) on 06/05/20 at [phone]  Med List Status: <None>   Medication Order Taking? Sig Documenting Provider Last Dose Status Informant  acetaminophen (TYLENOL) 325 MG tablet [id number] Yes Take 650 mg by mouth every 6 (six) hours as needed for mild pain or moderate pain. [provider] Taking Active Self  ALPRAZolam Duanne Moron) 0.25 MG tablet [id number] Yes TAKE 1 TABLET BY MOUTH AS NEEDED FOR ANXIETY [name], MD Taking Active   Blood Glucose Monitoring Suppl (ONE TOUCH ULTRA 2) w/Device KIT [id number] Yes Use daily to check sugars as needed. Dx E11.9 [name], MD Taking Active   carvedilol (COREG) 25 MG tablet [id number] Yes TAKE 2 TABLETS(50 MG) BY MOUTH TWICE DAILY WITH A MEAL [name], MD Taking Active   dofetilide (TIKOSYN) 500 MCG capsule [id number] Yes Take 1 capsule (500 mcg total) by mouth 2 (two) times daily. [name], MD Taking Active   empagliflozin (JARDIANCE) 25 MG TABS tablet [id number] Yes Take 1 tablet (25 mg total) by mouth daily. [name], MD Taking Active   fluticasone [facility]) 50 MCG/ACT nasal spray [id number] Yes Place 2 sprays into both nostrils daily. Tower, Wynelle Fanny, MD Taking Active   furosemide (LASIX) 40 MG tablet [id number] Yes Take 1 tablet (40 mg total) by mouth every Friday. Bensimhon, Shaune Pascal, MD Taking Active   glipiZIDE (GLUCOTROL XL) 10 MG 24 hr tablet [id number] Yes TAKE 1 TABLET(10 MG) BY MOUTH DAILY WITH BREAKFAST [name], MD Taking Active    glucose blood (ONE TOUCH ULTRA TEST) test strip [id number] Yes CHECK GLUCOSE TWICE DAILY AND AS NEEDED, DX. E11.65 (UNCONTROLLED DM) [name], MD Taking Active   loratadine (CLARITIN) 10 MG tablet [id number] Yes Take 10 mg by mouth daily as needed for allergies. [provider] Taking Active   Magnesium Oxide 400 MG CAPS [id number] Yes Take 1 capsule (400 mg total) by mouth daily. Deboraha Sprang, MD Taking Active   metFORMIN (GLUCOPHAGE-XR) 500 MG 24 hr tablet [id number] Yes TAKE 3 TABLETS(1500 MG) BY MOUTH DAILY WITH BREAKFAST [name], MD Taking Active   Multiple Vitamin (MULTIVITAMIN) tablet [id number] Yes Take 1 tablet by mouth daily. [provider] Taking Active Self  Omeprazole-Sodium Bicarbonate (ZEGERID) 20-1100 MG CAPS capsule [id number] Yes Take 1 capsule by mouth 2 (two) times daily. [provider] Taking Active Self  Pitavastatin Calcium (LIVALO) 1 MG TABS [id number] Yes TAKE 1 TABLET BY MOUTH DAILY IF TOLERATED [name], MD Taking Active   potassium chloride SA (KLOR-CON) 20 MEQ tablet [id number] Yes Take 1 tablet (20 mEq total) by mouth 2 (two) times daily. [name], MD Taking Active   sacubitril-valsartan [facility]) 97-103 MG [id number] Yes Take 1 tablet by mouth 2 (two) times daily. Pt need to keep upcoming appt in June for further refills [name], MD Taking Active   spironolactone (ALDACTONE) 25 MG tablet [id number] Yes TAKE 1 TABLET(25 MG) BY MOUTH DAILY [name], MD Taking Active   traMADol (ULTRAM) 50 MG tablet [id number] Yes TAKE 1 TABLET(50 MG) BY MOUTH EVERY 12 HOURS AS NEEDED FOR PAIN [name], MD Taking Active   XARELTO 20 MG TABS tablet [id number] Yes TAKE 1 TABLET(20 MG) BY MOUTH DAILY WITH SUPPER [name], MD  Taking Active   Med List Note Dennie Fetters, LPN 69/45/03 8882):              Patient Active Problem List   Diagnosis Date Noted   Tendinitis of thumb 05/28/2020   Neck pain  04/02/2020   Ear pain 04/02/2020   ICD (implantable cardioverter-defibrillator) in place 02/04/2020   Shoulder pain, bilateral 12/01/2018   Low back pain 09/07/2016   Family history of prostate cancer 06/23/2016   Advance care planning 06/23/2016   Colon polyps 06/23/2016   Healthcare maintenance 03/08/2016   A-fib ([place]) 10/14/2015   Inappropriate shocks from ICD (implantable cardioverter-defibrillator) 03/17/2015   Obesity (BMI 30-39.9) 12/05/2014   ICD (implantable cardioverter-defibrillator) discharge 80/03/4915   Chronic systolic heart failure ([place]) 02/14/2013   Hyperlipidemia 02/05/2013   Uncontrolled type 2 diabetes mellitus with circulatory disorder, without long-term current use of insulin ([place]) 09/30/2011   Nonischemic cardiomyopathy ([place]) 08/18/2011   Chest pain on exertion 08/17/2011   Other restrictive cardiomyopathy ([place]) 07/16/2009   OSTEOARTHROSIS UNSPEC WHETHER GEN/LOCALIZED HAND 08/16/2008   Essential hypertension 05/11/2007   History of renal calculi 05/11/2007   Fatty liver 03/20/2007   ANXIETY 10/04/2006   HEARING IMPAIRMENT 10/04/2006   GERD 09/20/2006    Immunization History  Administered Date(s) Administered   Influenza, Seasonal, Injecte, Preservative Fre 12/03/2013   Influenza,inj,Quad PF,6+ Mos 10/05/2016, 11/04/2017, 12/01/2018   Influenza-Unspecified 12/09/2012, 11/24/2014, 11/20/2019   Moderna Sars-Covid-2 Vaccination 05/13/2019, 06/12/2019, 10/04/2019   Pneumococcal Polysaccharide-23 09/30/2011   Td 07/23/2015    Conditions to be addressed/monitored:  Hyperlipidemia, Diabetes, and Heart Failure  Unable to reach patient. Chart reviewed. A1c improved, 7/2%, Lipid panel improved, LDL 100 at goal. Potassium level WNL.   Plan: Will have team contact patient for BG log review and to check on patient status of signing assistance applications.  Medication Assistance:  Check on applications for Xarelto, [name] - patient planned to sign forms in  office 06/04/20  [name]:  Medication:                Last Fill:         Day Supply Jardiance 25 mg         02/28/20              90  (prior filled 02/19/20 30 DS - dose changed from 10 to 25 mg daily) Glipizide 10 mg           07/07/20            30 Metformin 500 mg       04/15/20            90 Livalo 1 mg                  05/29/20           90 Entreso 97-103 mg      07/01/20             30  No gaps in adherence  Patient's preferred pharmacy is:  [id number] [address] [place] [address] Phone: [phone] Fax: [phone]  [name], PharmD Clinical Pharmacist [facility] [phone]

## 2020-07-11 ENCOUNTER — Telehealth: Payer: Self-pay

## 2020-07-11 NOTE — Chronic Care Management (AMB) (Addendum)
Chronic Care Management Pharmacy Assistant   Name: Aaron Thompson  MRN: [id number] DOB: 1960-12-06  Reason for Encounter: Diabetes and Patient Assistance Update   Recent office visits:  05/27/20 - Dr. [name], PCP - Lipid panel, A1c, CMP. He had been taking 2 potassium every day.  Recheck lab pending. Taking lasix on Friday.   Taking pitavastatin daily, has tolerated that dose.  No medication changes.  Your potassium is still normal.  Your lipids improved and your A1c is down to 7.2.  I would continue as is and recheck in about 3 months  Recent consult visits:  07/07/20- Cardiology- AFIB- Ordered BMP and magnesium labs. No changes to medication. 04/30/20 - Telephone, Entresto PA approved 04/25/20 - Cardiology - AFIB, CHF, continue current meds, RTC 6 months 04/01/20 - Echocardiogram - 25-30% 03/31/20 - Cardiology, [facility] - Add Lasix 40 mg + KCl 20 on every Friday, repeat echo, see back in 3-4 months.  Hospital visits:  None in previous 6 months  Medications: Outpatient Encounter Medications as of 07/11/2020  Medication Sig   acetaminophen (TYLENOL) 325 MG tablet Take 650 mg by mouth every 6 (six) hours as needed for mild pain or moderate pain.   ALPRAZolam (XANAX) 0.25 MG tablet TAKE 1 TABLET BY MOUTH AS NEEDED FOR ANXIETY   Blood Glucose Monitoring Suppl (ONE TOUCH ULTRA 2) w/Device KIT Use daily to check sugars as needed. Dx E11.9   carvedilol (COREG) 25 MG tablet TAKE 2 TABLETS(50 MG) BY MOUTH TWICE DAILY WITH A MEAL   dofetilide (TIKOSYN) 500 MCG capsule Take 1 capsule (500 mcg total) by mouth 2 (two) times daily.   empagliflozin (JARDIANCE) 25 MG TABS tablet Take 1 tablet (25 mg total) by mouth daily.   furosemide (LASIX) 40 MG tablet Take 1 tablet (40 mg total) by mouth every Friday.   glipiZIDE (GLUCOTROL XL) 10 MG 24 hr tablet TAKE 1 TABLET(10 MG) BY MOUTH DAILY WITH BREAKFAST   glucose blood (ONE TOUCH ULTRA TEST) test strip CHECK GLUCOSE TWICE DAILY AND AS NEEDED, DX. E11.65  (UNCONTROLLED DM)   loratadine (CLARITIN) 10 MG tablet Take 10 mg by mouth daily as needed for allergies.   Magnesium Oxide 400 MG CAPS Take 1 capsule (400 mg total) by mouth daily.   metFORMIN (GLUCOPHAGE-XR) 500 MG 24 hr tablet TAKE 3 TABLETS(1500 MG) BY MOUTH DAILY WITH BREAKFAST   Multiple Vitamin (MULTIVITAMIN) tablet Take 1 tablet by mouth daily.   Omeprazole-Sodium Bicarbonate (ZEGERID) 20-1100 MG CAPS capsule Take 1 capsule by mouth 2 (two) times daily.   Pitavastatin Calcium (LIVALO) 1 MG TABS TAKE 1 TABLET BY MOUTH DAILY IF TOLERATED   potassium chloride SA (KLOR-CON) 20 MEQ tablet Take 1 tablet (20 mEq total) by mouth 2 (two) times daily.   sacubitril-valsartan (ENTRESTO) 97-103 MG Take 1 tablet by mouth 2 (two) times daily. Pt need to keep upcoming appt in June for further refills   spironolactone (ALDACTONE) 25 MG tablet TAKE 1 TABLET(25 MG) BY MOUTH DAILY   traMADol (ULTRAM) 50 MG tablet TAKE 1 TABLET(50 MG) BY MOUTH EVERY 12 HOURS AS NEEDED FOR PAIN   XARELTO 20 MG TABS tablet TAKE 1 TABLET(20 MG) BY MOUTH DAILY WITH SUPPER   No facility-administered encounter medications on file as of 07/11/2020.     Recent Relevant Labs: Lab Results  Component Value Date/Time   HGBA1C 7.2 (H) 05/27/2020 12:22 PM   HGBA1C 7.8 (A) 02/28/2020 11:34 AM   HGBA1C 7.5 (H) 06/21/2019 09:45 AM  MICROALBUR 1.7 10/09/2010 11:36 AM    Kidney Function Lab Results  Component Value Date/Time   CREATININE 0.66 07/07/2020 10:46 AM   CREATININE 0.71 05/27/2020 12:22 PM   CREATININE 0.77 10/01/2015 12:02 PM   CREATININE 0.91 08/28/2015 09:09 AM   GFR 101.06 05/27/2020 12:22 PM   GFRNONAA >60 07/07/2020 10:46 AM   GFRAA 117 09/10/2019 09:58 AM     Current antihyperglycemic regimen:  Jardiance 25 mg - 1 tablet daily Metformin 500 mg ER - 3 tablets with breakfast Glipizide ER 10 mg - 1 tablet daily with breakfast   Patient verbally confirms he is taking the above medications as directed.  Yes  What diet changes have been made to improve diabetes control? Has not made any changes to diet. Has some days that he tries to eat well and reduce carb intake.  What recent interventions/DTPs have been made to improve glycemic control:  No recent interventions. States he has started to work out a little bit.   Have there been any recent hospitalizations or ED visits since last visit with CPP? No  Patient denies hypoglycemic symptoms, including Pale, Sweaty, Shaky, Hungry, Nervous/irritable, and Vision changes  Patient denies hyperglycemic symptoms, including blurry vision, excessive thirst, fatigue, polyuria, and weakness  How often are you checking your blood sugar? once daily  What are your blood sugars ranging?  Fasting: 145, 138,122, 111, 98, 145, 149, 140, 138, 143, 123, 129, 130, 129, 139, 135, 119 Before meals: [name] After meals: [name] Bedtime: [name]  On insulin? No  During the week, how often does your blood glucose drop below 70? Never  Are you checking your feet daily/regularly? Yes  Adherence Review: Is the patient currently on a STATIN medication? Yes Is the patient currently on ACE/ARB medication? Yes Does the patient have >5 day gap between last estimated fill dates? Yes  Care Gaps: Last annual wellness visit: 07/03/20 Last eye exam / retinopathy screening: States last exam March. Last diabetic foot exam: 05/27/20  Patient states he has not signed application forms for Entresto or Xarelto. He states his medication is cheaper and he does not feel like he needs it at this time. He said he would like to look into it at the beginning of the year.   [name]:  Medication:  Last Fill: Day Supply Jardiance 25 mg 02/28/20  90  - patient states he misses some doses every now and then Glipizide 10 mg 07/07/20 30 Metformin 500 mg 04/15/20 90 Pitavastatin 1 mg 05/29/20  30 - patient states he misses some doses every now and then Entresto 97-103 mg 07/01/20  30   No  appointments scheduled within the next 30 days.   Follow-Up:  Pharmacist Review  [name], CPP notified  [name], [place] Assistant [phone]  I have reviewed the care management and care coordination activities outlined in this encounter and I am certifying that I agree with the content of this note. No further action required.  [name], PharmD Clinical Pharmacist [facility] [place] [phone]

## 2020-07-21 ENCOUNTER — Other Ambulatory Visit: Payer: Self-pay | Admitting: Cardiovascular Disease

## 2020-08-25 ENCOUNTER — Other Ambulatory Visit: Payer: Self-pay | Admitting: Family Medicine

## 2020-08-26 NOTE — Telephone Encounter (Signed)
Refill request for Tramadol 50 mg tablets  LOV - 05/27/20 Next OV - not scheduled Last refill - 05/27/20

## 2020-08-27 NOTE — Telephone Encounter (Signed)
Sent. Thanks.   

## 2020-09-03 ENCOUNTER — Ambulatory Visit (INDEPENDENT_AMBULATORY_CARE_PROVIDER_SITE_OTHER): Payer: HMO

## 2020-09-03 DIAGNOSIS — I428 Other cardiomyopathies: Secondary | ICD-10-CM | POA: Diagnosis not present

## 2020-09-04 LAB — CUP PACEART REMOTE DEVICE CHECK
Battery Remaining Longevity: 66 mo
Battery Remaining Percentage: 77 %
Battery Voltage: 2.98 V
Brady Statistic AP VP Percent: 1 %
Brady Statistic AP VS Percent: 1 %
Brady Statistic AS VP Percent: 98 %
Brady Statistic AS VS Percent: 1 %
Brady Statistic RA Percent Paced: 1 %
Date Time Interrogation Session: 20220810020016
HighPow Impedance: 83 Ohm
HighPow Impedance: 83 Ohm
Implantable Lead Implant Date: 20150121
Implantable Lead Implant Date: 20150121
Implantable Lead Implant Date: 20150121
Implantable Lead Location: 753858
Implantable Lead Location: 753859
Implantable Lead Location: 753860
Implantable Lead Model: 7122
Implantable Pulse Generator Implant Date: 20210512
Lead Channel Impedance Value: 480 Ohm
Lead Channel Impedance Value: 480 Ohm
Lead Channel Impedance Value: 530 Ohm
Lead Channel Pacing Threshold Amplitude: 0.875 V
Lead Channel Pacing Threshold Amplitude: 1.125 V
Lead Channel Pacing Threshold Amplitude: 1.25 V
Lead Channel Pacing Threshold Pulse Width: 0.4 ms
Lead Channel Pacing Threshold Pulse Width: 0.4 ms
Lead Channel Pacing Threshold Pulse Width: 0.5 ms
Lead Channel Sensing Intrinsic Amplitude: 12 mV
Lead Channel Sensing Intrinsic Amplitude: 5 mV
Lead Channel Setting Pacing Amplitude: 1.875
Lead Channel Setting Pacing Amplitude: 2.125
Lead Channel Setting Pacing Amplitude: 2.25 V
Lead Channel Setting Pacing Pulse Width: 0.4 ms
Lead Channel Setting Pacing Pulse Width: 0.5 ms
Lead Channel Setting Sensing Sensitivity: 0.5 mV
Pulse Gen Serial Number: 9890691

## 2020-09-08 ENCOUNTER — Telehealth: Payer: Self-pay

## 2020-09-08 NOTE — Chronic Care Management (AMB) (Addendum)
Chronic Care Management Pharmacy Assistant   Name: Aaron Thompson  MRN: [id number] DOB: Jun 01, 1960  Reason for Encounter: Statin Adherence  Recent office visits:  None since last CCM contact  Recent consult visits:  None since last  CCM contact  Hospital visits:  None in previous 6 months  Medications: Outpatient Encounter Medications as of 09/08/2020  Medication Sig   acetaminophen (TYLENOL) 325 MG tablet Take 650 mg by mouth every 6 (six) hours as needed for mild pain or moderate pain.   ALPRAZolam (XANAX) 0.25 MG tablet TAKE 1 TABLET BY MOUTH AS NEEDED FOR ANXIETY   Blood Glucose Monitoring Suppl (ONE TOUCH ULTRA 2) w/Device KIT Use daily to check sugars as needed. Dx E11.9   carvedilol (COREG) 25 MG tablet TAKE 2 TABLETS(50 MG) BY MOUTH TWICE DAILY WITH A MEAL   dofetilide (TIKOSYN) 500 MCG capsule Take 1 capsule (500 mcg total) by mouth 2 (two) times daily.   empagliflozin (JARDIANCE) 25 MG TABS tablet Take 1 tablet (25 mg total) by mouth daily.   furosemide (LASIX) 40 MG tablet Take 1 tablet (40 mg total) by mouth every Friday.   glipiZIDE (GLUCOTROL XL) 10 MG 24 hr tablet TAKE 1 TABLET(10 MG) BY MOUTH DAILY WITH BREAKFAST   glucose blood (ONE TOUCH ULTRA TEST) test strip CHECK GLUCOSE TWICE DAILY AND AS NEEDED, DX. E11.65 (UNCONTROLLED DM)   loratadine (CLARITIN) 10 MG tablet Take 10 mg by mouth daily as needed for allergies.   Magnesium Oxide 400 MG CAPS Take 1 capsule (400 mg total) by mouth daily.   metFORMIN (GLUCOPHAGE-XR) 500 MG 24 hr tablet TAKE 3 TABLETS(1500 MG) BY MOUTH DAILY WITH BREAKFAST   Multiple Vitamin (MULTIVITAMIN) tablet Take 1 tablet by mouth daily.   Omeprazole-Sodium Bicarbonate (ZEGERID) 20-1100 MG CAPS capsule Take 1 capsule by mouth 2 (two) times daily.   Pitavastatin Calcium (LIVALO) 1 MG TABS TAKE 1 TABLET BY MOUTH DAILY IF TOLERATED   potassium chloride SA (KLOR-CON) 20 MEQ tablet Take 1 tablet (20 mEq total) by mouth 2 (two) times daily.    sacubitril-valsartan (ENTRESTO) 97-103 MG Take 1 tablet by mouth 2 (two) times daily. Pt need to keep upcoming appt in June for further refills   spironolactone (ALDACTONE) 25 MG tablet TAKE 1 TABLET(25 MG) BY MOUTH DAILY   traMADol (ULTRAM) 50 MG tablet TAKE 1 TABLET(50 MG) BY MOUTH EVERY 12 HOURS AS NEEDED FOR PAIN   XARELTO 20 MG TABS tablet TAKE 1 TABLET(20 MG) BY MOUTH DAILY WITH SUPPER   No facility-administered encounter medications on file as of 09/08/2020.     Contacted Aaron Thompson on 09/11/20 for general disease state and medication adherence call. Livalo 9m  Patient is not > 5 days past due for refill on the following medications per chart history:  Star Medications: Medication Name/mg Last Fill Days Supply Glipizide 135m 09/01/20  30 Livalo 37m18m 09/03/20 90 Metformin XR 500m59m/2/22  90 Jardiance 25mg58m10/22 30 Entresto 97-103 mg  08/29/20  30  What concerns do you have about your medications? The patient just refilled his Livalo and is taking 1 daily at this time  The patient denies side effects with his medications.  How often do you forget or accidentally miss a dose? Once a week The patient reports he stopped the Livalo for 1 week and then refilled the prescription and started taking again about 10 days ago and has some muscle soreness and joint pain.  Are  you having any problems getting your medications from your pharmacy? No  Has the cost of your medications been a concern? Yes  The Livalo was getting expensive is why the patient stopped taking.  Since last visit with CPP, the following interventions have been made: The patient has started taking Livalo again 1 tablet daily. He got a 90 day supply.  The patient has not had an ED visit since last contact.   The patient denies problems with their health. The patient reports he joined a gym to work through the muscle aches and pain.  he denies  concerns or questions for [name], Pharm. D at this time.    Counseled patient on:  Doristine Devoid job taking medications, Importance of taking medication daily without missed doses, and Access to CCM team for any cost, medication or pharmacy concerns.  Care Gaps: None  Last eye exam / retinopathy screening: patient reports March 2022 Last diabetic foot exam:07/03/20  Next visits: Cardiology appointment on 11/25/2020  [name], CPP notified  Aaron Thompson, [place] Assistant [phone]  I have reviewed the care management and care coordination activities outlined in this encounter and I am certifying that I agree with the content of this note. Patient provided with PAP forms for his medications earlier this year and declined applying due to cost improving. No further action required.  [name], PharmD Clinical Pharmacist [facility] [phone]

## 2020-09-09 ENCOUNTER — Other Ambulatory Visit: Payer: Self-pay

## 2020-09-09 NOTE — Telephone Encounter (Signed)
Refill request for Alprazolam 0.25 mg tablets  LOV - 05/27/20 Next OV - not scheduled Last refill - 06/17/20 #30/1

## 2020-09-10 MED ORDER — ALPRAZOLAM 0.25 MG PO TABS: ORAL_TABLET | ORAL | 1 refills | Status: DC

## 2020-09-10 NOTE — Telephone Encounter (Signed)
Sent. Thanks.   

## 2020-09-24 ENCOUNTER — Other Ambulatory Visit: Payer: Self-pay | Admitting: Cardiovascular Disease

## 2020-09-25 NOTE — Progress Notes (Signed)
Remote ICD transmission.   

## 2020-10-22 ENCOUNTER — Other Ambulatory Visit: Payer: Self-pay | Admitting: Cardiovascular Disease

## 2020-10-25 ENCOUNTER — Other Ambulatory Visit: Payer: Self-pay | Admitting: Family Medicine

## 2020-11-02 ENCOUNTER — Other Ambulatory Visit: Payer: Self-pay | Admitting: Family Medicine

## 2020-11-03 ENCOUNTER — Telehealth: Payer: Self-pay

## 2020-11-03 NOTE — Progress Notes (Signed)
    Chronic Care Management Pharmacy Assistant   Name: Aaron Thompson  MRN: [id number] DOB: 01/04/1961  Reason for Encounter: Reminder Call  Medications: Outpatient Encounter Medications as of 11/03/2020  Medication Sig   acetaminophen (TYLENOL) 325 MG tablet Take 650 mg by mouth every 6 (six) hours as needed for mild pain or moderate pain.   ALPRAZolam (XANAX) 0.25 MG tablet TAKE 1 TABLET BY MOUTH AS NEEDED FOR ANXIETY   Blood Glucose Monitoring Suppl (ONE TOUCH ULTRA 2) w/Device KIT Use daily to check sugars as needed. Dx E11.9   carvedilol (COREG) 25 MG tablet TAKE 2 TABLETS(50 MG) BY MOUTH TWICE DAILY WITH A MEAL   dofetilide (TIKOSYN) 500 MCG capsule Take 1 capsule (500 mcg total) by mouth 2 (two) times daily.   empagliflozin (JARDIANCE) 25 MG TABS tablet Take 1 tablet (25 mg total) by mouth daily.   ENTRESTO 97-103 MG TAKE 1 TABLET BY MOUTH TWICE DAILY   furosemide (LASIX) 40 MG tablet Take 1 tablet (40 mg total) by mouth every Friday.   glipiZIDE (GLUCOTROL XL) 10 MG 24 hr tablet TAKE 1 TABLET(10 MG) BY MOUTH DAILY WITH BREAKFAST   glucose blood (ONE TOUCH ULTRA TEST) test strip CHECK GLUCOSE TWICE DAILY AND AS NEEDED, DX. E11.65 (UNCONTROLLED DM)   loratadine (CLARITIN) 10 MG tablet Take 10 mg by mouth daily as needed for allergies.   Magnesium Oxide 400 MG CAPS Take 1 capsule (400 mg total) by mouth daily.   metFORMIN (GLUCOPHAGE-XR) 500 MG 24 hr tablet TAKE 3 TABLETS(1500 MG) BY MOUTH DAILY WITH BREAKFAST   Multiple Vitamin (MULTIVITAMIN) tablet Take 1 tablet by mouth daily.   Omeprazole-Sodium Bicarbonate (ZEGERID) 20-1100 MG CAPS capsule Take 1 capsule by mouth 2 (two) times daily.   Pitavastatin Calcium (LIVALO) 1 MG TABS TAKE 1 TABLET BY MOUTH DAILY IF TOLERATED   potassium chloride SA (KLOR-CON) 20 MEQ tablet Take 1 tablet (20 mEq total) by mouth 2 (two) times daily.   spironolactone (ALDACTONE) 25 MG tablet TAKE 1 TABLET(25 MG) BY MOUTH DAILY   traMADol (ULTRAM) 50 MG  tablet TAKE 1 TABLET(50 MG) BY MOUTH EVERY 12 HOURS AS NEEDED FOR PAIN   XARELTO 20 MG TABS tablet TAKE 1 TABLET(20 MG) BY MOUTH DAILY WITH SUPPER   No facility-administered encounter medications on file as of 11/03/2020.   Aaron Thompson was contacted to remind him of his upcoming telephone visit with [name] on 11/10/2020 at 1:00 pm. Patient was reminded to have all medications, supplements and any blood glucose and blood pressure readings available for review at appointment.  Are you having any problems with your medications? No  Do you have any concerns you like to discuss with the pharmacist? No  [name]: Medication:   Last Fill: Day Supply Metformin 500mg   10/27/2020 30 Pitavastatin Calcium 1mg  09/03/2020 90  Glipizide 10mg   10/27/2020 [place], CPP notified  Marijean Niemann, [place]   Time Spent: 10 Minutes

## 2020-11-10 ENCOUNTER — Telehealth: Payer: Self-pay

## 2020-11-10 ENCOUNTER — Other Ambulatory Visit: Payer: Self-pay

## 2020-11-10 ENCOUNTER — Ambulatory Visit (INDEPENDENT_AMBULATORY_CARE_PROVIDER_SITE_OTHER): Payer: HMO

## 2020-11-10 DIAGNOSIS — E7800 Pure hypercholesterolemia, unspecified: Secondary | ICD-10-CM

## 2020-11-10 DIAGNOSIS — I10 Essential (primary) hypertension: Secondary | ICD-10-CM

## 2020-11-10 NOTE — Progress Notes (Signed)
Chronic Care Management Pharmacy Note  11/10/20 Name:  Aaron Thompson MRN:  [id number] DOB:  [birth year]  Subjective: Aaron Thompson is an 60 y.o. year old male who is a primary patient of [name], [name], MD.  The CCM team was consulted for assistance with disease management and care coordination needs.    Engaged with patient by telephone for follow up visit in response to provider referral for pharmacy case management and/or care coordination services.   Consent to Services:  The patient was given information about Chronic Care Management services, agreed to services, and gave verbal consent prior to initiation of services.  Please see initial visit note for detailed documentation.   Patient Care Team: [name], MD as PCP - General (Family Medicine) [name], MD as PCP - Cardiology (Cardiology) [name], [facility] as Pharmacist (Pharmacist)  Recent office visits: 05/26/20 - PCP visit - Diabetic foot exam completed. Labs - LDL improved, 100. A1c improved, 7.2%. Follow up 3 months.  Recent consult visits: 07/07/20 - Cardiology - Take Furosemide 40 mg Every Friday. Labs done today, your results will be available in MyChart, we will contact you for abnormal readings.    Hospital visits: None in previous 6 months  Objective:  Lab Results  Component Value Date   CREATININE 0.66 07/07/2020   BUN 16 07/07/2020   GFR 101.06 05/27/2020   GFRNONAA >60 07/07/2020   GFRAA 117 09/10/2019   NA 138 07/07/2020   K 4.3 07/07/2020   CALCIUM 9.5 07/07/2020   CO2 26 07/07/2020   GLUCOSE 141 (H) 07/07/2020    Lab Results  Component Value Date/Time   HGBA1C 7.2 (H) 05/27/2020 12:22 PM   HGBA1C 7.8 (A) 02/28/2020 11:34 AM   HGBA1C 7.5 (H) 06/21/2019 09:45 AM   GFR 101.06 05/27/2020 12:22 PM   GFR 108.76 06/21/2019 09:45 AM   MICROALBUR 1.7 10/09/2010 11:36 AM    Last diabetic Eye exam:  Lab Results  Component Value Date/Time   HMDIABEYEEXA No Retinopathy 04/07/2020  12:00 AM    Last diabetic Foot exam: 05/27/20 - normal   Lab Results  Component Value Date   CHOL 162 05/27/2020   HDL 34.60 (L) 05/27/2020   LDLCALC 100 (H) 05/27/2020   LDLDIRECT 97.0 06/16/2016   TRIG 140.0 05/27/2020   CHOLHDL 5 05/27/2020   Hepatic Function Latest Ref Rng & Units 05/27/2020 06/21/2019 04/06/2019  Total Protein 6.0 - 8.3 g/dL 6.8 7.0 6.6  Albumin 3.5 - 5.2 g/dL 4.3 4.5 4.2  AST 0 - 37 U/L '17 19 14  ' ALT 0 - 53 U/L '17 19 17  ' Alk Phosphatase 39 - 117 U/L 44 50 52  Total Bilirubin 0.2 - 1.2 mg/dL 0.5 0.6 0.6  Bilirubin, Direct 0.0 - 0.3 mg/dL - - -   Last echo 1/20 EF 25-30%  Lab Results  Component Value Date/Time   TSH 2.09 07/05/2017 08:39 AM   TSH 1.380 12/14/2016 02:29 PM   FREET4 0.90 07/29/2015 06:44 AM    CBC Latest Ref Rng & Units 06/01/2019 04/06/2019 12/14/2016  WBC 3.4 - 10.8 x10E3/uL 6.1 7.1 8.2  Hemoglobin 13.0 - 17.7 g/dL 15.9 15.7 15.3  Hematocrit 37.5 - 51.0 % 47.0 46.7 46.1  Platelets 150 - 450 x10E3/uL 178 180.0 215   No results found for: VD25OH  Clinical ASCVD: No  The 10-year ASCVD risk score (Arnett DK, et al., 2019) is: 17.4%   Values used to calculate the score:     Age:  32 years     Sex: Male     Is Non-Hispanic African American: No     Diabetic: Yes     Tobacco smoker: No     Systolic Blood Pressure: 782 mmHg     Is BP treated: Yes     HDL Cholesterol: 34.6 mg/dL     Total Cholesterol: 162 mg/dL    Depression screen [facility] 2/9 07/03/2020 06/21/2019 05/15/2019  Decreased Interest 0 0 0  Down, Depressed, Hopeless 0 0 0  PHQ - 2 Score 0 0 0  Altered sleeping 0 - 0  Tired, decreased energy 0 - 0  Change in appetite 0 - 0  Feeling bad or failure about yourself  0 - 0  Trouble concentrating 0 - 0  Moving slowly or fidgety/restless 0 - 0  Suicidal thoughts 0 - 0  PHQ-9 Score 0 - 0  Difficult doing work/chores Not difficult at all - Not difficult at all  Some recent data might be hidden    Social History   Tobacco Use  Smoking  Status Never  Smokeless Tobacco Former   Types: Snuff   Quit date: 10/12/2004  Tobacco Comments   02/14/2013 "quit snuff in 2006"   BP Readings from Last 3 Encounters:  07/07/20 124/89  05/27/20 116/68  04/25/20 110/72   Pulse Readings from Last 3 Encounters:  07/07/20 83  05/27/20 81  04/25/20 83   Wt Readings from Last 3 Encounters:  07/07/20 286 lb 6.4 oz (129.9 kg)  05/27/20 286 lb (129.7 kg)  04/25/20 288 lb (130.6 kg)   BMI Readings from Last 3 Encounters:  07/07/20 34.86 kg/m  05/27/20 34.81 kg/m  04/25/20 35.06 kg/m    Assessment/Interventions: Review of patient past medical history, allergies, medications, health status, including review of consultants reports, laboratory and other test data, was performed as part of comprehensive evaluation and provision of chronic care management services.   SDOH:  (Social Determinants of Health) assessments and interventions performed: Yes   SDOH Screenings   Alcohol Screen: Low Risk    Last Alcohol Screening Score (AUDIT): 0  Depression (PHQ2-9): Low Risk    PHQ-2 Score: 0  Financial Resource Strain: Low Risk    Difficulty of Paying Living Expenses: Not hard at all  Food Insecurity: No Food Insecurity   Worried About Charity fundraiser in the Last Year: Never true   Ran Out of Food in the Last Year: Never true  Housing: Low Risk    Last Housing Risk Score: 0  Physical Activity: Insufficiently Active   Days of Exercise per Week: 7 days   Minutes of Exercise per Session: 20 min  Social Connections: Not on file  Stress: No Stress Concern Present   Feeling of Stress : Not at all  Tobacco Use: Medium Risk   Smoking Tobacco Use: Never   Smokeless Tobacco Use: Former  Transport planner Needs: No Data processing manager (Medical): No   Lack of Transportation (Non-Medical): No    CCM Care Plan  Allergies  Allergen Reactions   Atorvastatin     Muscle pain     Medications Reviewed Today      Reviewed by [name], [facility] (Pharmacist) on 11/10/20 at 33  Med List Status: <None>   Medication Order Taking? Sig Documenting Provider Last Dose Status Informant  acetaminophen (TYLENOL) 325 MG tablet [id number]  Take 650 mg by mouth every 6 (six) hours as needed for mild pain or moderate pain. [provider]  Active Self  ALPRAZolam (XANAX) 0.25 MG tablet [id number]  TAKE 1 TABLET BY MOUTH AS NEEDED FOR ANXIETY [name], MD  Active   Blood Glucose Monitoring Suppl (ONE TOUCH ULTRA 2) w/Device KIT [id number]  Use daily to check sugars as needed. Dx E11.9 [name], MD  Active   carvedilol (COREG) 25 MG tablet [id number]  TAKE 2 TABLETS(50 MG) BY MOUTH TWICE DAILY WITH A MEAL [name], MD  Active   dofetilide (TIKOSYN) 500 MCG capsule [id number]  Take 1 capsule (500 mcg total) by mouth 2 (two) times daily. [name], MD  Active   ENTRESTO 97-103 MG [id number]  TAKE 1 TABLET BY MOUTH TWICE DAILY [name], MD  Active   furosemide (LASIX) 40 MG tablet [id number]  Take 1 tablet (40 mg total) by mouth every Friday. Bensimhon, Shaune Pascal, MD  Active   glipiZIDE (GLUCOTROL XL) 10 MG 24 hr tablet [id number]  TAKE 1 TABLET(10 MG) BY MOUTH DAILY WITH BREAKFAST [name], MD  Active   glucose blood (ONE TOUCH ULTRA TEST) test strip [id number]  CHECK GLUCOSE TWICE DAILY AND AS NEEDED, DX. E11.65 (UNCONTROLLED DM) [name], MD  Active   JARDIANCE 25 MG TABS tablet [id number]  TAKE 1 TABLET BY MOUTH EVERY DAY [name], MD  Active   loratadine (CLARITIN) 10 MG tablet [id number]  Take 10 mg by mouth daily as needed for allergies. [provider]  Active   Magnesium Oxide 400 MG CAPS [id number]  Take 1 capsule (400 mg total) by mouth daily. Deboraha Sprang, MD  Active   metFORMIN (GLUCOPHAGE-XR) 500 MG 24 hr tablet [id number]  TAKE 3 TABLETS(1500 MG) BY MOUTH DAILY WITH BREAKFAST [name], MD  Active   Multiple Vitamin (MULTIVITAMIN) tablet  [id number]  Take 1 tablet by mouth daily. [provider]  Active Self  Omeprazole-Sodium Bicarbonate (ZEGERID) 20-1100 MG CAPS capsule [id number]  Take 1 capsule by mouth 2 (two) times daily. [provider]  Active Self  Pitavastatin Calcium (LIVALO) 1 MG TABS [id number]  TAKE 1 TABLET BY MOUTH DAILY IF TOLERATED [name], MD  Active   potassium chloride SA (KLOR-CON) 20 MEQ tablet [id number]  Take 1 tablet (20 mEq total) by mouth 2 (two) times daily. [name], MD  Active   spironolactone (ALDACTONE) 25 MG tablet [id number]  TAKE 1 TABLET(25 MG) BY MOUTH DAILY [name], MD  Active   traMADol (ULTRAM) 50 MG tablet [id number]  TAKE 1 TABLET(50 MG) BY MOUTH EVERY 12 HOURS AS NEEDED FOR PAIN [name], MD  Active   XARELTO 20 MG TABS tablet [id number]  TAKE 1 TABLET(20 MG) BY MOUTH DAILY WITH SUPPER [name], MD  Active   Med List Note Dennie Fetters, LPN 19/50/93 2671):              Patient Active Problem List   Diagnosis Date Noted   Tendinitis of thumb 05/28/2020   Neck pain 04/02/2020   Ear pain 04/02/2020   ICD (implantable cardioverter-defibrillator) in place 02/04/2020   Shoulder pain, bilateral 12/01/2018   Low back pain 09/07/2016   Family history of prostate cancer 06/23/2016   Advance care planning 06/23/2016   Colon polyps 06/23/2016   Healthcare maintenance 03/08/2016   A-fib ([place]) 10/14/2015   Inappropriate shocks from ICD (implantable cardioverter-defibrillator) 03/17/2015   Obesity (BMI 30-39.9) 12/05/2014   ICD (implantable cardioverter-defibrillator) discharge 04/19/2014  Chronic systolic heart failure ([place]) 02/14/2013   Hyperlipidemia 02/05/2013   Uncontrolled type 2 diabetes mellitus with circulatory disorder, without long-term current use of insulin 09/30/2011   Nonischemic cardiomyopathy ([place]) 08/18/2011   Chest pain on exertion 08/17/2011   Other restrictive cardiomyopathy ([place]) 07/16/2009   OSTEOARTHROSIS  UNSPEC WHETHER GEN/LOCALIZED HAND 08/16/2008   Essential hypertension 05/11/2007   History of renal calculi 05/11/2007   Fatty liver 03/20/2007   ANXIETY 10/04/2006   HEARING IMPAIRMENT 10/04/2006   GERD 09/20/2006    Immunization History  Administered Date(s) Administered   Influenza, Seasonal, Injecte, Preservative Fre 12/03/2013   Influenza,inj,Quad PF,6+ Mos 10/05/2016, 11/04/2017, 12/01/2018   Influenza-Unspecified 12/09/2012, 11/24/2014, 11/20/2019   Moderna Sars-Covid-2 Vaccination 05/13/2019, 06/12/2019, 10/04/2019   Pneumococcal Polysaccharide-23 09/30/2011   Td 07/23/2015    Conditions to be addressed/monitored:  Hypertension, Hyperlipidemia, Diabetes, Atrial Fibrillation and Heart Failure  Care Plan : [place]  Updates made by [name], [place] since 11/10/2020 12:00 AM     Problem: CHL AMB "PATIENT-SPECIFIC PROBLEM"      Goal: Disease Management   Start Date: 05/15/2020  This Visit's Progress: On track  Priority: High  Note:   Current Barriers:  Home BG are trending upward  Pharmacist Clinical Goal(s):  Patient will contact provider office for questions/concerns as evidenced notation of same in electronic health record through collaboration with PharmD and provider.   Interventions: 1:1 collaboration with [name], MD regarding development and update of comprehensive plan of care as evidenced by provider attestation and co-signature Inter-disciplinary care team collaboration (see longitudinal plan of care) Comprehensive medication review performed; medication list updated in electronic medical record  Hyperlipidemia: (LDL goal < 70) -Uncontrolled - LDL 100 on last check -Current treatment: Livalo 1 mg - 1 tablet every other daily (max tolerated dose) -Medications previously tried: Lipitor - muscle aches, pt tried daily Livalo and had muscle aches  -Recommended to continue current medication; Consider addition of Zetia for LDL goal  < 70.   Diabetes (A1c goal <7%) -Not ideally controlled - A1c 7.2% (May 2022) improving, although patient reports home BG are higher (150-160) this month -Current medications: Jardiance 25 mg - 1 tablet daily  Metformin 500 mg ER - 3 tablets with breakfast Glipizide ER 10 mg - 1 tablet daily with breakfast -Medications previously tried: upset stomach with metformin IR -Current home glucose readings - checking every morning - reports BG trending up this month compared to July 2022 (were closer to 120-140 before) fasting glucose: 150-160 in morning (155 highest in Sept) post prandial glucose: none -Denies hypoglycemic/hyperglycemic symptoms -Current meal patterns: tries to avoid bread, chips breakfast - eggs, sausage snacks: he has slipped up with potato chips (prior subbed with carrots, cucumbers), apples  -Current exercise: walking about 1/2 mile every other day (he has taken some days off with vacation), trying to get back into the swing of going to the [facility] every other day -Educated on A1c and blood sugar goals; -Counseled to check feet daily and get yearly eye exams - up to date -Recommended to continue current medication; He is going to try to stick to exercise routine, limit potato chips, and reduce stress. Continue to watch home BG. Try to limit chips/snacks.  Atrial Fibrillation (Goal: prevent stroke and major bleeding) -S/p ablation, f/u Dr. Caryl Comes  -Current treatment: Rhythm control: Tikosyn 500 mcg - 1 tablet BID Anticoagulation: Xarelto 20 mg - 1 tablet daily  -Medications previously tried: none reported -Counseled on cost savings programs -  pt chose not to proceed with Xarelto PAP due to cost improved. -Recommended to continue current medication  Heart Failure (Goal: manage symptoms and prevent exacerbations) -Followed by Dr. [name] -Last ejection fraction: 25-30% (Date: 2020) -HF type: Systolic -NYHA Class: II (slight limitation of activity) -AHA HF Stage: C (Heart  disease and symptoms present) -Current treatment: Carvedilol 25 mg - 2 tablet BID Spironolactone 25 mg - 1 tablet daily  Entresto 97-103 mg - 1 tablet twice daily Potassium Chloride 20 mEq ER - 2 tablets daily Furosemide 40 mg - 1 tablet on fridays  Jardiance 25 mg - 1 tablet daily  -Medications previously tried: none  -Current home BP/HR readings:  Friday - 119/78, 77 afternoon 100/67, 85; Sat - 118/75, 75, afternoon 118/70, 81; yesterday - 119/72, 79; 125/70, 83; This morning 122/76, 78 -He does not weigh daily. He does watch for symptoms of fluid overload.  -Recommended to continue current medication  Patient Goals/Self-Care Activities Patient will:  - focus on medication adherence by using a pillbox  Follow Up Plan: Telephone follow up appointment with care management team member scheduled for: 6 months (DM and cholesterol)      Medication Assistance: None required.  Patient affirms current coverage meets needs.  [name]:  Medication:                            Last Fill:         Day Supply Metformin 558m                    10/27/2020      30 Pitavastatin Calcium 160m      09/03/2020      90         Glipizide 1021m                      10/27/2020      30   No gaps in adherence -States he is religious about taking meds every morning, same time each day  [id number]: [phone]: [phone] Plan and Follow Up Patient Decision:  Patient agrees to Care Plan and Follow-up.   MicDebbora DusharmD Clinical Pharmacist [phone]

## 2020-11-10 NOTE — Telephone Encounter (Signed)
Please schedule f/u when possible.  We can do A1c at the visit.  Thanks.

## 2020-11-10 NOTE — Telephone Encounter (Signed)
I believe patient is due for A1c and 3 month diabetes visit with Dr. [name].  [name], PharmD Clinical Pharmacist [facility] at [facility] [phone]

## 2020-11-10 NOTE — Patient Instructions (Signed)
Dear Aaron Thompson,  Below is a summary of the goals we discussed during our follow up appointment on November 10, 2020. Please contact me anytime with questions or concerns.   Visit Information Patient Care Plan: CCM Pharmacy Care Plan     Problem Identified: CHL AMB "PATIENT-SPECIFIC PROBLEM"      Goal: Disease Management   Start Date: 05/15/2020  This Visit's Progress: On track  Priority: High  Note:   Current Barriers:  Home BG are trending upward  Pharmacist Clinical Goal(s):  Patient will contact provider office for questions/concerns as evidenced notation of same in electronic health record through collaboration with PharmD and provider.   Interventions: 1:1 collaboration with [name], MD regarding development and update of comprehensive plan of care as evidenced by provider attestation and co-signature Inter-disciplinary care team collaboration (see longitudinal plan of care) Comprehensive medication review performed; medication list updated in electronic medical record  Hyperlipidemia: (LDL goal < 70) -Uncontrolled - LDL 100 on last check -Current treatment: Livalo 1 mg - 1 tablet every other daily (max tolerated dose) -Medications previously tried: Lipitor - muscle aches, pt tried daily Livalo and had muscle aches  -Recommended to continue current medication; Consider addition of Zetia for LDL goal < 70.   Diabetes (A1c goal <7%) -Not ideally controlled - A1c 7.2% (May 2022) improving, although patient reports home BG are higher (150-160) this month -Current medications: Jardiance 25 mg - 1 tablet daily  Metformin 500 mg ER - 3 tablets with breakfast Glipizide ER 10 mg - 1 tablet daily with breakfast -Medications previously tried: upset stomach with metformin IR -Current home glucose readings - checking every morning - reports BG trending up this month compared to July 2022 (were closer to 120-140 before) fasting glucose: 150-160 in morning (155 highest in  Sept) post prandial glucose: none -Denies hypoglycemic/hyperglycemic symptoms -Current meal patterns: tries to avoid bread, chips breakfast - eggs, sausage snacks: he has slipped up with potato chips (prior subbed with carrots, cucumbers), apples  -Current exercise: walking about 1/2 mile every other day (he has taken some days off with vacation), trying to get back into the swing of going to the [facility] every other day -Educated on A1c and blood sugar goals; -Counseled to check feet daily and get yearly eye exams - up to date -Recommended to continue current medication; He is going to try to stick to exercise routine, limit potato chips, and reduce stress. Continue to watch home BG. Try to limit chips/snacks.  Atrial Fibrillation (Goal: prevent stroke and major bleeding) -S/p ablation, f/u Dr. Caryl Comes  -Current treatment: Rhythm control: Tikosyn 500 mcg - 1 tablet BID Anticoagulation: Xarelto 20 mg - 1 tablet daily  -Medications previously tried: none reported -Counseled on cost savings programs - pt chose not to proceed with Xarelto PAP due to cost improved. -Recommended to continue current medication  Heart Failure (Goal: manage symptoms and prevent exacerbations) -Followed by Dr. [name] -Last ejection fraction: 25-30% (Date: 2020) -HF type: Systolic -NYHA Class: II (slight limitation of activity) -AHA HF Stage: C (Heart disease and symptoms present) -Current treatment: Carvedilol 25 mg - 2 tablet BID Spironolactone 25 mg - 1 tablet daily  Entresto 97-103 mg - 1 tablet twice daily Potassium Chloride 20 mEq ER - 2 tablets daily Furosemide 40 mg - 1 tablet on fridays  Jardiance 25 mg - 1 tablet daily  -Medications previously tried: none  -Current home BP/HR readings:  Friday - 119/78, 77 afternoon 100/67, 85; Sat -  118/75, 75, afternoon 118/70, 81; yesterday - 119/72, 79; 125/70, 83; This morning 122/76, 78 -He does not weigh daily. He does watch for symptoms of fluid overload.   -Recommended to continue current medication  Patient Goals/Self-Care Activities Patient will:  - focus on medication adherence by using a pillbox  Follow Up Plan: Telephone follow up appointment with care management team member scheduled for: 6 months (DM and cholesterol)      Patient verbalizes understanding of instructions provided today and agrees to view in [place].   [name], PharmD Clinical Pharmacist [facility] at [facility] [phone]

## 2020-11-11 ENCOUNTER — Encounter: Payer: Self-pay | Admitting: Family Medicine

## 2020-11-11 NOTE — Telephone Encounter (Signed)
Called patient and left vm and sent letter.

## 2020-11-12 NOTE — Telephone Encounter (Signed)
Called patient and got him scheduled for 12/16 @10 

## 2020-11-24 DIAGNOSIS — I10 Essential (primary) hypertension: Secondary | ICD-10-CM

## 2020-11-24 DIAGNOSIS — E7800 Pure hypercholesterolemia, unspecified: Secondary | ICD-10-CM

## 2020-11-25 ENCOUNTER — Other Ambulatory Visit: Payer: Self-pay

## 2020-11-25 ENCOUNTER — Ambulatory Visit (INDEPENDENT_AMBULATORY_CARE_PROVIDER_SITE_OTHER): Payer: HMO | Admitting: Internal Medicine

## 2020-11-25 ENCOUNTER — Other Ambulatory Visit: Payer: Self-pay | Admitting: Family Medicine

## 2020-11-25 ENCOUNTER — Encounter: Payer: Self-pay | Admitting: Internal Medicine

## 2020-11-25 VITALS — BP 110/60 | HR 84 | Ht 76.0 in | Wt 289.0 lb

## 2020-11-25 DIAGNOSIS — I4891 Unspecified atrial fibrillation: Secondary | ICD-10-CM

## 2020-11-25 DIAGNOSIS — I428 Other cardiomyopathies: Secondary | ICD-10-CM

## 2020-11-25 DIAGNOSIS — Z95810 Presence of automatic (implantable) cardiac defibrillator: Secondary | ICD-10-CM | POA: Diagnosis not present

## 2020-11-25 NOTE — Telephone Encounter (Signed)
Refill request for Tramadol 50 mg tablets  LOV - 05/27/20 Next OV - 01/09/21 Last refill - 08/27/20 #60/2

## 2020-11-25 NOTE — Progress Notes (Signed)
Patient Care Team: [name], MD as PCP - General (Family Medicine) [name], MD as PCP - Cardiology (Cardiology) [name], [facility] as Pharmacist (Pharmacist) All  HPI  Aaron Thompson is a 60 y.o. male Seen in followup for CRT- ICD implanted for nonischemic cardiomyopathy and left bundle branch block.  He was noted on  his device to have atrial fibrillation and he was started on Rivaroxaban for stroke reduction prophylaxis   Chose to stop his anticoagulation and to return to work. March 2016 he had ICD shocks associated with very rapid atrial fibrillation.  Rx w dofetilide therapy  And Rivaroxaban; underwent catheter ablation of his atrial fibrillation (JA) 9/17   The patient denies chest pain, shortness of breath, nocturnal dyspnea, orthopnea or peripheral edema.  There have been no palpitations, lightheadedness or syncope.       DATE TEST  EF    9/17 Echo  40 %    11/18  Echo   20-25%      1/20   Echo  25-30%    3/22 Echo  25-30%         Date Cr K Hgb Mg  3/21 0.69 4.7 15.7    5/21 0.74 4.7 15.9   6/22 0.66 4.3  2.5      Past Medical History:  Diagnosis Date   AICD (automatic cardioverter/defibrillator) present    Anxiety state, unspecified    Calculus of kidney 1981   "passed it on my own"   Chronic systolic CHF (congestive heart failure) ([place])    Dermatophytosis of the body    DJD (degenerative joint disease)    Esophageal reflux    Hx of colonic polyps    Hypertension    LBBB (left bundle branch block)    intermittent   Non-ischemic cardiomyopathy ([place])    a. cath 2013: minor nonobstructive CAD.   OSA (obstructive sleep apnea)    he can't tolerate CPAP.    Osteoarthrosis, unspecified whether generalized or localized, hand    Other chronic nonalcoholic liver disease    fatty liver   PAF (paroxysmal atrial fibrillation) ([place])    a. s/p DCCV 6/11; previously on Pradaxa;  b. Event Monitor 2012->No PAF;  c. 09/2011 s/p DCCV ->Xarelto  started. d. 03/2014: inappropriate ICD shocks for AF-RVR, started on Tikosyn, Xarelto restarted.   Type II diabetes mellitus ([place])    Unspecified hearing loss    no hearing aid    Past Surgical History:  Procedure Laterality Date   Abdominal US  01/2007   Fatty liver, no gallstones   APPENDECTOMY     ATRIAL TACH ABLATION  09/2015   BI-VENTRICULAR IMPLANTABLE CARDIOVERTER DEFIBRILLATOR [name] 02/14/2013   STJ CRTD implanted by Dr Caryl Comes   BILATERAL KNEE ARTHROSCOPY     BIV ICD GENERATOR CHANGEOUT [name] 06/06/2019   Procedure: BIV ICD GENERATOR CHANGEOUT;  Surgeon: Deboraha Sprang, MD;  Location: [place] [facility];  Service: Cardiovascular;  Laterality: [name];   CARDIOVERSION  09/29/2011   Procedure: CARDIOVERSION;  Surgeon: Thayer Headings, MD;  Location: [place];  Service: Cardiovascular;  Laterality: [name];   COLONOSCOPY WITH PROPOFOL [name] 10/26/2016   Procedure: COLONOSCOPY WITH PROPOFOL;  Surgeon: Irene Shipper, MD;  Location: [facility];  Service: Endoscopy;  Laterality: [name];   DOPPLER ECHOCARDIOGRAPHY  11/2009   Decreased EF of 35-40%   ELECTROPHYSIOLOGIC STUDY [name] 10/14/2015   Procedure: Atrial Fibrillation Ablation;  Surgeon: [name], MD;  Location:  [facility];  Service: Cardiovascular;  Laterality: [name];   LEFT HEART CATHETERIZATION WITH CORONARY ANGIOGRAM [name] 08/18/2011   Procedure: LEFT HEART CATHETERIZATION WITH CORONARY ANGIOGRAM;  Surgeon: Peter M Martinique, MD;  Location: [facility] C[facility];  Service: Cardiovascular;  Laterality: [name];   NECK SURGERY     plate/fusion   Stress Cardiolite  08/2005   Normal, EF 42%   UPPER GASTROINTESTINAL ENDOSCOPY  08/2006   GERD    Current Outpatient Medications  Medication Sig Dispense Refill   acetaminophen (TYLENOL) 325 MG tablet Take 650 mg by mouth every 6 (six) hours as needed for mild pain or moderate pain.     ALPRAZolam (XANAX) 0.25 MG tablet TAKE 1 TABLET BY MOUTH AS NEEDED FOR ANXIETY 30 tablet 1   Blood Glucose Monitoring Suppl (ONE  TOUCH ULTRA 2) w/Device KIT Use daily to check sugars as needed. Dx E11.9 1 kit 0   carvedilol (COREG) 25 MG tablet TAKE 2 TABLETS(50 MG) BY MOUTH TWICE DAILY WITH A MEAL 360 tablet 3   dofetilide (TIKOSYN) 500 MCG capsule Take 1 capsule (500 mcg total) by mouth 2 (two) times daily. 180 capsule 2   ENTRESTO 97-103 MG TAKE 1 TABLET BY MOUTH TWICE DAILY 180 tablet 2   furosemide (LASIX) 40 MG tablet Take 1 tablet (40 mg total) by mouth every Friday. 5 tablet 11   glipiZIDE (GLUCOTROL XL) 10 MG 24 hr tablet TAKE 1 TABLET(10 MG) BY MOUTH DAILY WITH BREAKFAST 30 tablet 5   glucose blood (ONE TOUCH ULTRA TEST) test strip CHECK GLUCOSE TWICE DAILY AND AS NEEDED, DX. E11.65 (UNCONTROLLED DM) 100 each 5   JARDIANCE 25 MG TABS tablet TAKE 1 TABLET BY MOUTH EVERY DAY 90 tablet 1   loratadine (CLARITIN) 10 MG tablet Take 10 mg by mouth daily as needed for allergies.     Magnesium Oxide 400 MG CAPS Take 1 capsule (400 mg total) by mouth daily. 90 capsule 3   metFORMIN (GLUCOPHAGE-XR) 500 MG 24 hr tablet TAKE 3 TABLETS(1500 MG) BY MOUTH DAILY WITH BREAKFAST 90 tablet 1   Multiple Vitamin (MULTIVITAMIN) tablet Take 1 tablet by mouth daily.     Omeprazole-Sodium Bicarbonate (ZEGERID) 20-1100 MG CAPS capsule Take 1 capsule by mouth 2 (two) times daily.     Pitavastatin Calcium (LIVALO) 1 MG TABS TAKE 1 TABLET BY MOUTH DAILY IF TOLERATED     potassium chloride SA (KLOR-CON) 20 MEQ tablet Take 1 tablet (20 mEq total) by mouth 2 (two) times daily.     spironolactone (ALDACTONE) 25 MG tablet TAKE 1 TABLET(25 MG) BY MOUTH DAILY 90 tablet 0   traMADol (ULTRAM) 50 MG tablet TAKE 1 TABLET(50 MG) BY MOUTH EVERY 12 HOURS AS NEEDED FOR PAIN 60 tablet 2   XARELTO 20 MG TABS tablet TAKE 1 TABLET(20 MG) BY MOUTH DAILY WITH SUPPER 30 tablet 5   No current facility-administered medications for this visit.    Allergies  Allergen Reactions   Atorvastatin     Muscle pain     Review of Systems negative except from HPI and  PMH  Physical Exam  BP 110/60 (BP Location: Left Arm, Patient Position: Sitting, Cuff Size: Normal)   Pulse 84   Ht '6\' 4"'  (1.93 m)   Wt 289 lb (131.1 kg)   SpO2 96%   BMI 35.18 kg/m   Well developed and Morbidly obese in no acute distress HENT normal Neck supple with JVP-flat Clear Device pocket well healed; without hematoma or erythema.  There is no tethering  Regular rate and rhythm, no  murmur Abd-soft with active BS No Clubbing cyanosis  edema Skin-warm and dry A & Oriented  Grossly normal sensory and motor function  ECG sinus with P-synchronous/ AV  pacing  Neg QRS 1 and rS lead V1   Nonischemic cardiomyopathy  Congestive heart failure- chronic-systolic  PVC RBBB inferior axis   Hypertension  Implantable defibrillator-CRT   Sleep disorder breathing  Atrial fibrillation s/p PVI-JA 2017  Inappropriate ICD discharges  No interval atrial fibrillation.  We will continue him on dofetilide 500 mcg twice daily.  QT interval and electrolytes are within range.  No bleeding.  Continue him on Xarelto 20 mg daily.  Euvolemic.  We will continue him on Lasix 20 mg a week and 25 mg spironolactone daily  PVC burden as measured by his device is less than 1%

## 2020-11-25 NOTE — Patient Instructions (Signed)
Medication Instructions:  Your physician recommends that you continue on your current medications as directed. Please refer to the Current Medication list given to you today.  *If you need a refill on your cardiac medications before your next appointment, please call your pharmacy*   Lab Work: None ordered.  If you have labs (blood work) drawn today and your tests are completely normal, you will receive your results only by: [place] (if you have MyChart) OR A paper copy in the mail If you have any lab test that is abnormal or we need to change your treatment, we will call you to review the results.   Testing/Procedures: None ordered.    Follow-Up: At [facility], you and your health needs are our priority.  As part of our continuing mission to provide you with exceptional heart care, we have created designated Provider Care Teams.  These Care Teams include your primary Cardiologist (physician) and Advanced Practice Providers (APPs -  Physician Assistants and Nurse Practitioners) who all work together to provide you with the care you need, when you need it.  We recommend signing up for the patient portal called "MyChart".  Sign up information is provided on this After Visit Summary.  MyChart is used to connect with patients for Virtual Visits (Telemedicine).  Patients are able to view lab/test results, encounter notes, upcoming appointments, etc.  Non-urgent messages can be sent to your provider as well.   To learn more about what you can do with MyChart, go to [email or web address].    Your next appointment:   6 month(s)  The format for your next appointment:   In Person  Provider:   You will see one of the following Advanced Practice Providers on your designated Care Team:   Tommye Standard, [place] "[facility]" [place], [place]

## 2020-11-25 NOTE — Telephone Encounter (Signed)
Sent. Thanks.   

## 2020-11-26 ENCOUNTER — Other Ambulatory Visit: Payer: Self-pay | Admitting: Cardiovascular Disease

## 2020-11-29 ENCOUNTER — Other Ambulatory Visit: Payer: Self-pay | Admitting: Cardiovascular Disease

## 2020-12-03 ENCOUNTER — Ambulatory Visit (INDEPENDENT_AMBULATORY_CARE_PROVIDER_SITE_OTHER): Payer: HMO

## 2020-12-03 DIAGNOSIS — I428 Other cardiomyopathies: Secondary | ICD-10-CM | POA: Diagnosis not present

## 2020-12-03 LAB — CUP PACEART REMOTE DEVICE CHECK
Battery Remaining Longevity: 64 mo
Battery Remaining Percentage: 73 %
Battery Voltage: 2.96 V
Brady Statistic AP VP Percent: 1.2 %
Brady Statistic AP VS Percent: 1 %
Brady Statistic AS VP Percent: 96 %
Brady Statistic AS VS Percent: 1.4 %
Brady Statistic RA Percent Paced: 1 %
Date Time Interrogation Session: 20221109020015
HighPow Impedance: 83 Ohm
HighPow Impedance: 83 Ohm
Implantable Lead Implant Date: 20150121
Implantable Lead Implant Date: 20150121
Implantable Lead Implant Date: 20150121
Implantable Lead Location: 753858
Implantable Lead Location: 753859
Implantable Lead Location: 753860
Implantable Lead Model: 7122
Implantable Pulse Generator Implant Date: 20210512
Lead Channel Impedance Value: 480 Ohm
Lead Channel Impedance Value: 560 Ohm
Lead Channel Impedance Value: 580 Ohm
Lead Channel Pacing Threshold Amplitude: 0.875 V
Lead Channel Pacing Threshold Amplitude: 1.125 V
Lead Channel Pacing Threshold Amplitude: 1.25 V
Lead Channel Pacing Threshold Pulse Width: 0.4 ms
Lead Channel Pacing Threshold Pulse Width: 0.4 ms
Lead Channel Pacing Threshold Pulse Width: 0.5 ms
Lead Channel Sensing Intrinsic Amplitude: 2.6 mV
Lead Channel Sensing Intrinsic Amplitude: 5 mV
Lead Channel Setting Pacing Amplitude: 1.875
Lead Channel Setting Pacing Amplitude: 2.125
Lead Channel Setting Pacing Amplitude: 2.25 V
Lead Channel Setting Pacing Pulse Width: 0.4 ms
Lead Channel Setting Pacing Pulse Width: 0.5 ms
Lead Channel Setting Sensing Sensitivity: 0.5 mV
Pulse Gen Serial Number: 9890691

## 2020-12-10 ENCOUNTER — Other Ambulatory Visit: Payer: Self-pay | Admitting: Cardiovascular Disease

## 2020-12-11 NOTE — Telephone Encounter (Signed)
Pt last saw Dr Caryl Comes, last labs 07/07/20 Creat 0.66, age 60, weight 131.1kg, CrCl 223.47, based on CrCl pt is on appropriate dosage of Xarelto 20mg  QD for afib.  Will refill rx.

## 2020-12-11 NOTE — Progress Notes (Signed)
Remote ICD transmission.   

## 2020-12-24 ENCOUNTER — Other Ambulatory Visit: Payer: Self-pay | Admitting: Family Medicine

## 2020-12-24 NOTE — Telephone Encounter (Signed)
Refill request for ALPRAZolam (XANAX) 0.25 MG tablet   LOV - 05/27/20 Next OV - 01/09/21 Last refill - 09/10/20 #30/1  *Patient also need glipizide 10 mg tablets refilled

## 2020-12-24 NOTE — Telephone Encounter (Signed)
Sent. Thanks.   

## 2020-12-30 ENCOUNTER — Other Ambulatory Visit: Payer: Self-pay | Admitting: Family Medicine

## 2021-01-05 ENCOUNTER — Telehealth: Payer: Self-pay

## 2021-01-05 DIAGNOSIS — E7800 Pure hypercholesterolemia, unspecified: Secondary | ICD-10-CM

## 2021-01-05 NOTE — Progress Notes (Addendum)
Chronic Care Management Pharmacy Assistant   Name: Aaron Thompson  MRN: [id number] DOB: 1960/11/25  Reason for Encounter: CCM (Diabetes Disease State)   Recent office visits:  None since last CCM contact  Recent consult visits:  11/25/2020 - Cardiology - Patient presented for nonischemic cardiomyopathy.  No medication changes.   Hospital visits:  None in previous 6 months  Medications: Outpatient Encounter Medications as of 01/05/2021  Medication Sig   acetaminophen (TYLENOL) 325 MG tablet Take 650 mg by mouth every 6 (six) hours as needed for mild pain or moderate pain.   ALPRAZolam (XANAX) 0.25 MG tablet TAKE 1 TABLET BY MOUTH AS NEEDED FOR ANXIETY   Blood Glucose Monitoring Suppl (ONE TOUCH ULTRA 2) w/Device KIT Use daily to check sugars as needed. Dx E11.9   carvedilol (COREG) 25 MG tablet TAKE 2 TABLETS(50 MG) BY MOUTH TWICE DAILY WITH A MEAL   dofetilide (TIKOSYN) 500 MCG capsule TAKE 1 CAPSULE(500 MCG) BY MOUTH TWICE DAILY   ENTRESTO 97-103 MG TAKE 1 TABLET BY MOUTH TWICE DAILY   furosemide (LASIX) 40 MG tablet Take 1 tablet (40 mg total) by mouth every Friday.   glipiZIDE (GLUCOTROL XL) 10 MG 24 hr tablet TAKE 1 TABLET(10 MG) BY MOUTH DAILY WITH BREAKFAST   glucose blood (ONE TOUCH ULTRA TEST) test strip CHECK GLUCOSE TWICE DAILY AND AS NEEDED, DX. E11.65 (UNCONTROLLED DM)   JARDIANCE 25 MG TABS tablet TAKE 1 TABLET BY MOUTH EVERY DAY   loratadine (CLARITIN) 10 MG tablet Take 10 mg by mouth daily as needed for allergies.   Magnesium Oxide 400 MG CAPS Take 1 capsule (400 mg total) by mouth daily.   metFORMIN (GLUCOPHAGE-XR) 500 MG 24 hr tablet TAKE 3 TABLETS(1500 MG) BY MOUTH DAILY WITH BREAKFAST   Multiple Vitamin (MULTIVITAMIN) tablet Take 1 tablet by mouth daily.   Omeprazole-Sodium Bicarbonate (ZEGERID) 20-1100 MG CAPS capsule Take 1 capsule by mouth 2 (two) times daily.   Pitavastatin Calcium (LIVALO) 1 MG TABS TAKE 1 TABLET BY MOUTH DAILY IF TOLERATED   potassium  chloride SA (KLOR-CON) 20 MEQ tablet TAKE 2 TABLETS(40 MEQ) BY MOUTH TWICE DAILY   spironolactone (ALDACTONE) 25 MG tablet TAKE 1 TABLET(25 MG) BY MOUTH DAILY   traMADol (ULTRAM) 50 MG tablet TAKE 1 TABLET(50 MG) BY MOUTH EVERY 12 HOURS AS NEEDED FOR PAIN   XARELTO 20 MG TABS tablet TAKE 1 TABLET(20 MG) BY MOUTH DAILY WITH SUPPER   No facility-administered encounter medications on file as of 01/05/2021.   Recent Relevant Labs: Lab Results  Component Value Date/Time   HGBA1C 7.2 (H) 05/27/2020 12:22 PM   HGBA1C 7.8 (A) 02/28/2020 11:34 AM   HGBA1C 7.5 (H) 06/21/2019 09:45 AM   MICROALBUR 1.7 10/09/2010 11:36 AM    Kidney Function Lab Results  Component Value Date/Time   CREATININE 0.66 07/07/2020 10:46 AM   CREATININE 0.71 05/27/2020 12:22 PM   CREATININE 0.77 10/01/2015 12:02 PM   CREATININE 0.91 08/28/2015 09:09 AM   GFR 101.06 05/27/2020 12:22 PM   GFRNONAA >60 07/07/2020 10:46 AM   GFRAA 117 09/10/2019 09:58 AM   Contacted patient on 01/05/2021 to discuss diabetes disease state.   Current antihyperglycemic regimen:  Jardiance 25 mg - 1 tablet daily Metformin 500 mg ER - 3 tablets with breakfast Glipizide ER 10 mg - 1 tablet daily with breakfast   Patient verbally confirms he is taking the above medications as directed. Yes  What diet changes have been made to improve diabetes control? Patient  states he is not really watching what he is eating. He has been trying not to eat anything before 10:00 am and doesn't eat after 6:00 pm. Had a biscuit from [employer] for breakfast and a BBQ sandwich for lunch.   What recent interventions/DTPs have been made to improve glycemic control:  No recent interventions.   Have there been any recent hospitalizations or ED visits since last visit with CPP? No  Patient denies hypoglycemic symptoms, including Pale, Sweaty, Shaky, Hungry, Nervous/irritable, and Vision changes  Patient denies hyperglycemic symptoms, including blurry vision,  excessive thirst, fatigue, polyuria, and weakness  How often are you checking your blood sugar? once daily, fasting  Date  Blood Sugar Readings 12/11  169 12/10  ------ 12/09  ------ 12/08  151 12/07  160 12/06  158 12/05  160 12/04  157 12/03  150 12/02  152 12/01  145  During the week, how often does your blood glucose drop below 70? Never  Are you checking your feet daily/regularly? Yes  Adherence Review: Is the patient currently on a STATIN medication? Yes Is the patient currently on ACE/ARB medication? Yes Does the patient have >5 day gap between last estimated fill dates? Yes - Livalo  Care Gaps: Annual wellness visit in last year? Yes 07/03/2020 Most recent A1C reading: 7.2 on 05/27/2020 Most Recent BP reading: 110/60 on 11/25/2020  Last eye exam / retinopathy screening: Up to date Last diabetic foot exam: Up to date  [name]:  Medication:   Last Fill: Day Supply Glipizide 10 mg  12/24/2020 90 Livalo 53m   09/03/2020 90 Metformin XR 5082m 12/30/2020 90 Jardiance 2531m12/09/2020 30 Entresto 97-103 mg  01/01/2021 30   PCP appointment on 01/09/2021  MicDebbora DusPP notified  AmyMarijean NiemannMACudahysistant [phone] have reviewed the care management and care coordination activities outlined in this encounter and I am certifying that I agree with the content of this note. No further action required.  MicDebbora DusharmD Clinical Pharmacist [phone]

## 2021-01-07 MED ORDER — LIVALO 1 MG PO TABS: ORAL_TABLET | ORAL | 3 refills | Status: DC

## 2021-01-07 NOTE — Addendum Note (Signed)
Addended by: [name] on: 01/07/2021 02:47 PM   Modules accepted: Orders

## 2021-01-07 NOTE — Telephone Encounter (Signed)
Prescription updated to reflect current dose of statin.

## 2021-01-09 ENCOUNTER — Ambulatory Visit: Payer: HMO | Admitting: Family Medicine

## 2021-01-11 ENCOUNTER — Telehealth: Payer: Self-pay | Admitting: Physician Assistant

## 2021-01-11 NOTE — Telephone Encounter (Signed)
Paged by answering service.  Patient reports that he had sudden onset shortness of breath this morning @ 8am at church.  No associated chest pain, palpitation, dizziness, orthopnea, PND, syncope, lower extremity edema or melena.  No fever or chills.  No sick contact.  His breathing issue lasted for 2 hours then gradually resolved.  During my phone conversation it was almost gone.  He cannot tell me that if he was out of rhythm or not.  Heart rate was between 50-60.  He was out in cold outside.  No clear etiology of his dyspnea.  I have recommended to continue to monitor symptoms.  If worsening breathing call EMS, go to ER or urgent care for further evaluation.  If he thinks he is in atrial fibrillation, he will give [place] a call.  Otherwise, he reach out to PCP in morning.

## 2021-01-12 ENCOUNTER — Other Ambulatory Visit: Payer: Self-pay | Admitting: Cardiovascular Disease

## 2021-01-12 ENCOUNTER — Telehealth: Payer: Self-pay | Admitting: Internal Medicine

## 2021-01-12 NOTE — Telephone Encounter (Signed)
Spoke with pt and advised per device clinic RN pt's is having a significant amount of PVC's.  Confirmed pt is taking Coreg and Dofetilide as prescribed.   Will forward to Dr Caryl Comes and Oda Kilts, PA-C for review and recommendation.  Appointment scheduled for 01/15/2021 to see PA-C for further evaluation.  Pt verbalizes understanding and agrees with current plan.

## 2021-01-12 NOTE — Telephone Encounter (Signed)
Patient c/o Palpitations:  High priority if patient c/o lightheadedness, shortness of breath, or chest pain  How long have you had palpitations/irregular HR/ Afib? Are you having the symptoms now? Yesterday, no  Are you currently experiencing lightheadedness, SOB or CP? no  Do you have a history of afib (atrial fibrillation) or irregular heart rhythm? yes  Have you checked your BP or HR? (document readings if available): yesterday was 45-50 when he checked, but [employer] said it was actually around 150 and the machine could not register it  Are you experiencing any other symptoms? no   Patient states yesterday he had an episode of heart fluttering. She states he spoke with [place] and they said it was the bottom but of the heart fluttering, not the top. He says it lasted from 9 am to around 4 pm yesterday, but he feels better today and does not have symptoms. He says he has been doing well for about a year until this.

## 2021-01-12 NOTE — Telephone Encounter (Signed)
Apt. Scheduled with A. Tillery on 12/22 to discuss PVCs

## 2021-01-12 NOTE — Telephone Encounter (Signed)
Transmission reviewed, significant amount of PVCs noted on presenting as well as episodes from yesterday attempted to contact patient to verify that he has been taking his Coreg BID and Tikosyn BID

## 2021-01-12 NOTE — Telephone Encounter (Signed)
Spoke with pt who states he had an episode of Afib/flutter yesterday from about 9am-4pm accompanied by SOB, chest tightness and light headedness.  Pt reports this is typical for him when in Afib.  Pt states symptoms resolved and pt was feeling well but about 10am today he started with increasing SOB so he believes he is back in Afib again. Requested pt send a device transmission for review. Pt reports he has been taking medications as prescribed, denies alcohol or caffeine intake.  Pt does report increased stress due to his son and 2 grandchildren being in his household now. Pt verbalizes understanding and agrees with current plan.

## 2021-01-12 NOTE — Telephone Encounter (Signed)
° ° ° ° ° °  All AMS resemble patients presenting rhythm

## 2021-01-14 ENCOUNTER — Telehealth: Payer: Self-pay | Admitting: Internal Medicine

## 2021-01-14 NOTE — Telephone Encounter (Signed)
See 12/19 phone note

## 2021-01-14 NOTE — Telephone Encounter (Signed)
Patient needs to speak to nurse about his appt tomorrow with the PA.

## 2021-01-14 NOTE — Telephone Encounter (Signed)
Transmission reviewed. No AMS episodes since 01/12/21. Presenting rhythm AS/BP at 92. Spoke with patient and gave him information he requested related to last episodes occurring 01/12/21 and presenting rhythm AS/BP. Patient will call back to cancel his appointment with A. Albertina Senegal, [place] if he decides appointment is not needed.

## 2021-01-14 NOTE — Telephone Encounter (Signed)
Patient is asking if he still needs to come see Oda Kilts tomorrow 12/22 for his PVC's. Stating he is feeling better and also that he would rather see Dr. Caryl Comes. After discussing the PA's background specializing in EP as well and Dr. Olin Pia first opening in February, the patient would like to see what a transmission looks like now and if the PVC's are happening less often. This will help him make an informed decision about keeping or canceling his appointment tomorrow and rescheduling with Dr. Caryl Comes.  Will have the device clinic review transmission and call patient with information.

## 2021-01-14 NOTE — Progress Notes (Signed)
Electrophysiology Office Note Date: 01/15/2021  ID:  Aaron Aaron, DOB January 08, 1961, MRN [id number]  PCP: [name], MD Primary Cardiologist: Jenkins Rouge, MD Electrophysiologist: Aaron Axe, MD   CC: Routine ICD follow-up  Aaron Aaron is a 60 y.o. male seen today for Aaron Axe, MD for acute visit due to PVCs on remote interrogation .  Since last being seen in our clinic the patient reports doing overall doing well. He felt poorly over the weekend, but took an extra dose of lasix with improvement. Currently, he denies chest pain, palpitations, dyspnea, PND, orthopnea, nausea, vomiting, dizziness, syncope, edema, weight gain, or early satiety. He has not had ICD shocks.   Device History: St. Jude BiV ICD implanted 05/2019 for CHF   Past Medical History:  Diagnosis Date   AICD (automatic cardioverter/defibrillator) present    Anxiety state, unspecified    Calculus of kidney 1981   "passed it on my own"   Chronic systolic CHF (congestive heart failure) ([place])    Dermatophytosis of the body    DJD (degenerative joint disease)    Esophageal reflux    Hx of colonic polyps    Hypertension    LBBB (left bundle branch block)    intermittent   Non-ischemic cardiomyopathy ([place])    a. cath 2013: minor nonobstructive CAD.   OSA (obstructive sleep apnea)    he can't tolerate CPAP.    Osteoarthrosis, unspecified whether generalized or localized, hand    Other chronic nonalcoholic liver disease    fatty liver   PAF (paroxysmal atrial fibrillation) ([place])    a. s/p DCCV 6/11; previously on Pradaxa;  b. Event Monitor 2012->No PAF;  c. 09/2011 s/p DCCV ->Xarelto started. d. 03/2014: inappropriate ICD shocks for AF-RVR, started on Tikosyn, Xarelto restarted.   Type II diabetes mellitus ([place])    Unspecified hearing loss    no hearing aid   Past Surgical History:  Procedure Laterality Date   Abdominal US  01/2007   Fatty liver, no gallstones   APPENDECTOMY     ATRIAL TACH  ABLATION  09/2015   BI-VENTRICULAR IMPLANTABLE CARDIOVERTER DEFIBRILLATOR [name] 02/14/2013   STJ CRTD implanted by Dr Caryl Comes   BILATERAL KNEE ARTHROSCOPY     BIV ICD GENERATOR CHANGEOUT [name] 06/06/2019   Procedure: BIV ICD GENERATOR CHANGEOUT;  Surgeon: Deboraha Sprang, MD;  Location: [facility];  Service: Cardiovascular;  Laterality: [name];   CARDIOVERSION  09/29/2011   Procedure: CARDIOVERSION;  Surgeon: Thayer Headings, MD;  Location: [facility];  Service: Cardiovascular;  Laterality: [name];   COLONOSCOPY WITH PROPOFOL [name] 10/26/2016   Procedure: COLONOSCOPY WITH PROPOFOL;  Surgeon: Irene Shipper, MD;  Location: [facility];  Service: Endoscopy;  Laterality: [name];   DOPPLER ECHOCARDIOGRAPHY  11/2009   Decreased EF of 35-40%   ELECTROPHYSIOLOGIC STUDY [name] 10/14/2015   Procedure: Atrial Fibrillation Ablation;  Surgeon: Aaron Grayer, MD;  Location: [facility];  Service: Cardiovascular;  Laterality: [name];   LEFT HEART CATHETERIZATION WITH CORONARY ANGIOGRAM [name] 08/18/2011   Procedure: LEFT HEART CATHETERIZATION WITH CORONARY ANGIOGRAM;  Surgeon: Aaron M Martinique, MD;  Location: [facility];  Service: Cardiovascular;  Laterality: [name];   NECK SURGERY     plate/fusion   Stress Cardiolite  08/2005   Normal, EF 42%   UPPER GASTROINTESTINAL ENDOSCOPY  08/2006   GERD    Current Outpatient Medications  Medication Sig Dispense Refill   acetaminophen (TYLENOL) 325 MG tablet Take 650 mg by mouth  every 6 (six) hours as needed for mild pain or moderate pain.     ALPRAZolam (XANAX) 0.25 MG tablet TAKE 1 TABLET BY MOUTH AS NEEDED FOR ANXIETY 30 tablet 1   Blood Glucose Monitoring Suppl (ONE TOUCH ULTRA 2) w/Device KIT Use daily to check sugars as needed. Dx E11.9 1 kit 0   carvedilol (COREG) 25 MG tablet TAKE 2 TABLETS(50 MG) BY MOUTH TWICE DAILY WITH A MEAL 360 tablet 3   dofetilide (TIKOSYN) 500 MCG capsule TAKE 1 CAPSULE(500 MCG) BY MOUTH TWICE DAILY 180 capsule 2   ENTRESTO 97-103 MG TAKE 1 TABLET BY  MOUTH TWICE DAILY 180 tablet 2   furosemide (LASIX) 40 MG tablet Take 1 tablet (40 mg total) by mouth every Friday. 5 tablet 11   glipiZIDE (GLUCOTROL XL) 10 MG 24 hr tablet TAKE 1 TABLET(10 MG) BY MOUTH DAILY WITH BREAKFAST 30 tablet 12   glucose blood (ONE TOUCH ULTRA TEST) test strip CHECK GLUCOSE TWICE DAILY AND AS NEEDED, DX. E11.65 (UNCONTROLLED DM) 100 each 5   JARDIANCE 25 MG TABS tablet TAKE 1 TABLET BY MOUTH EVERY DAY 90 tablet 1   loratadine (CLARITIN) 10 MG tablet Take 10 mg by mouth daily as needed for allergies.     Magnesium Oxide 400 MG CAPS Take 1 capsule (400 mg total) by mouth daily. 90 capsule 3   metFORMIN (GLUCOPHAGE-XR) 500 MG 24 hr tablet TAKE 3 TABLETS(1500 MG) BY MOUTH DAILY WITH BREAKFAST 270 tablet 1   Multiple Vitamin (MULTIVITAMIN) tablet Take 1 tablet by mouth daily.     Omeprazole-Sodium Bicarbonate (ZEGERID) 20-1100 MG CAPS capsule Take 1 capsule by mouth 2 (two) times daily.     Pitavastatin Calcium (LIVALO) 1 MG TABS TAKE 1 TABLET BY MOUTH EVERY OTHER DAY 45 tablet 3   potassium chloride SA (KLOR-CON) 20 MEQ tablet TAKE 2 TABLETS(40 MEQ) BY MOUTH TWICE DAILY 360 tablet 3   spironolactone (ALDACTONE) 25 MG tablet TAKE 1 TABLET(25 MG) BY MOUTH DAILY 90 tablet 1   traMADol (ULTRAM) 50 MG tablet TAKE 1 TABLET(50 MG) BY MOUTH EVERY 12 HOURS AS NEEDED FOR PAIN 60 tablet 2   XARELTO 20 MG TABS tablet TAKE 1 TABLET(20 MG) BY MOUTH DAILY WITH SUPPER 30 tablet 5   No current facility-administered medications for this visit.    Allergies:   Atorvastatin   Social History: Social History   Socioeconomic History   Marital status: Married    Spouse name: Not on file   Number of children: 2   Years of education: Not on file   Highest education level: Not on file  Occupational History   Occupation: PAINTER    Employer: UNEMPLOYED  Tobacco Use   Smoking status: Never   Smokeless tobacco: Former    Types: Snuff    Quit date: 10/12/2004   Tobacco comments:     02/14/2013 "quit snuff in 2006"  Vaping Use   Vaping Use: Never used  Substance and Sexual Activity   Alcohol use: No    Alcohol/week: 0.0 standard drinks   Drug use: No   Sexual activity: Yes  Other Topics Concern   Not on file  Social History Narrative   Lives in [place] with wife.  Works @ [employer] in [employer].   Married 1990   Social Determinants of Radio broadcast assistant Strain: Low Risk    Difficulty of Paying Living Expenses: Not hard at all  Food Insecurity: No Food Insecurity   Worried About Running Out  of Food in the Last Year: Never true   [place] in the Last Year: Never true  Transportation Needs: No Transportation Needs   Lack of Transportation (Medical): No   Lack of Transportation (Non-Medical): No  Physical Activity: Insufficiently Active   Days of Exercise per Week: 7 days   Minutes of Exercise per Session: 20 min  Stress: No Stress Concern Present   Feeling of Stress : Not at all  Social Connections: Not on file  Intimate Partner Violence: Not At Risk   Fear of Current or Ex-Partner: No   Emotionally Abused: No   Physically Abused: No   Sexually Abused: No    Family History: Family History  Problem Relation Age of Onset   Hypertension Mother    Diabetes Mother    Heart failure Mother        Died @ 16   Hypertension Father    Heart failure Father        Died @ 76   Prostate cancer Father    Diabetes Brother    Kidney disease Brother    Diabetes Brother    Prostate cancer Brother    Diabetes Brother    Other Sister    Heart attack Paternal Grandfather    Stroke Paternal Grandmother    Colon cancer Paternal Grandmother    Multiple myeloma Sister    Leukemia Sister    Colon polyps Sister    Cirrhosis Maternal Uncle        alcohol related   Esophageal cancer Neg Hx    Stomach cancer Neg Hx    Pancreatic cancer Neg Hx     Review of Systems: All other systems reviewed and are otherwise negative except as noted  above.   Physical Exam: Vitals:   01/15/21 1138  BP: 100/72  Pulse: 85  SpO2: 96%  Weight: 289 lb 12.8 oz (131.5 kg)  Height: _0  (1.93 m)     GEN- The patient is well appearing, alert and oriented x 3 today.   HEENT: normocephalic, atraumatic; sclera clear, conjunctiva pink; hearing intact; oropharynx clear; neck supple, no JVP Lymph- no cervical lymphadenopathy Lungs- Clear to ausculation bilaterally, normal work of breathing.  No wheezes, rales, rhonchi Heart- Regular rate and rhythm, no murmurs, rubs or gallops, PMI not laterally displaced GI- soft, non-tender, non-distended, bowel sounds present, no hepatosplenomegaly Extremities- no clubbing or cyanosis. No edema; DP/PT/radial pulses 2+ bilaterally MS- no significant deformity or atrophy Skin- warm and dry, no rash or lesion; ICD pocket well healed Psych- euthymic mood, full affect Neuro- strength and sensation are intact  ICD interrogation- reviewed in detail today,  See PACEART report  EKG:  EKG is ordered today. Personal review of EKG ordered today shows BiV pacing at 80 bpm. QTc stable when measured manually and corrected for QRS  Recent Labs: 03/31/2020: B Natriuretic Peptide 22.9 05/27/2020: ALT 17 07/07/2020: BUN 16; Creatinine, Ser 0.66; Magnesium 2.5; Potassium 4.3; Sodium 138   Wt Readings from Last 3 Encounters:  01/15/21 289 lb 12.8 oz (131.5 kg)  11/25/20 289 lb (131.1 kg)  07/07/20 286 lb 6.4 oz (129.9 kg)     Other studies Reviewed: Additional studies/ records that were reviewed today include: Previous EP office notes.   Assessment and Plan:  1.  Chronic systolic dysfunction s/p St. Jude CRT-D  euvolemic today Stable on an appropriate medical regimen Normal ICD function See Pace Art report No changes today  2. Atrial fibrillation S/p PVI 2017 Maintaining  NSR on tikosyn and s/p ablation EKG stable today Labs today.   3. PVCs Exacerbated recently for unclear reason.  Labs today.   Current  medicines are reviewed at length with the patient today.    Labs/ tests ordered today include:  Orders Placed This Encounter  Procedures   Basic metabolic panel   Magnesium   EKG 12-Lead     Disposition:   Follow up with Dr. Caryl Comes in 6 months    Signed, Shirley Friar, PA-C  01/15/2021 12:43 PM  [facility] [address] [place] [address] [phone] (office) [phone] (fax)

## 2021-01-15 ENCOUNTER — Ambulatory Visit: Payer: HMO | Admitting: Student

## 2021-01-15 ENCOUNTER — Encounter: Payer: Self-pay | Admitting: Student

## 2021-01-15 ENCOUNTER — Other Ambulatory Visit: Payer: Self-pay

## 2021-01-15 VITALS — BP 100/72 | HR 85 | Ht 76.0 in | Wt 289.8 lb

## 2021-01-15 DIAGNOSIS — I428 Other cardiomyopathies: Secondary | ICD-10-CM

## 2021-01-15 DIAGNOSIS — I4891 Unspecified atrial fibrillation: Secondary | ICD-10-CM | POA: Diagnosis not present

## 2021-01-15 LAB — CUP PACEART INCLINIC DEVICE CHECK
Battery Remaining Longevity: 62 mo
Brady Statistic RA Percent Paced: 0.17 %
Brady Statistic RV Percent Paced: 97 %
Date Time Interrogation Session: 20221222124440
HighPow Impedance: 86.625
Implantable Lead Implant Date: 20150121
Implantable Lead Implant Date: 20150121
Implantable Lead Implant Date: 20150121
Implantable Lead Location: 753858
Implantable Lead Location: 753859
Implantable Lead Location: 753860
Implantable Lead Model: 7122
Implantable Pulse Generator Implant Date: 20210512
Lead Channel Impedance Value: 475 Ohm
Lead Channel Impedance Value: 587.5 Ohm
Lead Channel Impedance Value: 612.5 Ohm
Lead Channel Pacing Threshold Amplitude: 0.75 V
Lead Channel Pacing Threshold Amplitude: 1.125 V
Lead Channel Pacing Threshold Amplitude: 1.125 V
Lead Channel Pacing Threshold Pulse Width: 0.4 ms
Lead Channel Pacing Threshold Pulse Width: 0.4 ms
Lead Channel Pacing Threshold Pulse Width: 0.5 ms
Lead Channel Sensing Intrinsic Amplitude: 12 mV
Lead Channel Sensing Intrinsic Amplitude: 5 mV
Lead Channel Setting Pacing Amplitude: 1.75 V
Lead Channel Setting Pacing Amplitude: 2.125
Lead Channel Setting Pacing Amplitude: 2.125
Lead Channel Setting Pacing Pulse Width: 0.4 ms
Lead Channel Setting Pacing Pulse Width: 0.5 ms
Lead Channel Setting Sensing Sensitivity: 0.5 mV
Pulse Gen Serial Number: 9890691

## 2021-01-15 NOTE — Patient Instructions (Signed)
Medication Instructions:  Your physician recommends that you continue on your current medications as directed. Please refer to the Current Medication list given to you today.  *If you need a refill on your cardiac medications before your next appointment, please call your pharmacy*   Lab Work: TODAY: BMET, Mag  If you have labs (blood work) drawn today and your tests are completely normal, you will receive your results only by: MyChart Message (if you have MyChart) OR A paper copy in the mail If you have any lab test that is abnormal or we need to change your treatment, we will call you to review the results.   Follow-Up: At [facility], you and your health needs are our priority.  As part of our continuing mission to provide you with exceptional heart care, we have created designated Provider Care Teams.  These Care Teams include your primary Cardiologist (physician) and Advanced Practice Providers (APPs -  Physician Assistants and Nurse Practitioners) who all work together to provide you with the care you need, when you need it.   Your next appointment:   6 month(s)  The format for your next appointment:   In Person  Provider:   Steven Klein, MD   

## 2021-01-16 LAB — BASIC METABOLIC PANEL
BUN/Creatinine Ratio: 21 — ABNORMAL HIGH (ref 9–20)
BUN: 17 mg/dL (ref 6–24)
CO2: 21 mmol/L (ref 20–29)
Calcium: 9.3 mg/dL (ref 8.7–10.2)
Chloride: 101 mmol/L (ref 96–106)
Creatinine, Ser: 0.8 mg/dL (ref 0.76–1.27)
Glucose: 117 mg/dL — ABNORMAL HIGH (ref 70–99)
Potassium: 4.7 mmol/L (ref 3.5–5.2)
Sodium: 142 mmol/L (ref 134–144)
eGFR: 102 mL/min/{1.73_m2} (ref >59)

## 2021-01-16 LAB — MAGNESIUM: Magnesium: 2.3 mg/dL (ref 1.6–2.3)

## 2021-01-22 ENCOUNTER — Encounter: Payer: Self-pay | Admitting: Family Medicine

## 2021-01-22 ENCOUNTER — Ambulatory Visit (INDEPENDENT_AMBULATORY_CARE_PROVIDER_SITE_OTHER): Payer: HMO | Admitting: Family Medicine

## 2021-01-22 ENCOUNTER — Other Ambulatory Visit: Payer: Self-pay

## 2021-01-22 VITALS — BP 118/80 | HR 87 | Temp 97.1°F | Ht 76.0 in | Wt 289.0 lb

## 2021-01-22 DIAGNOSIS — Z23 Encounter for immunization: Secondary | ICD-10-CM | POA: Diagnosis not present

## 2021-01-22 DIAGNOSIS — I4891 Unspecified atrial fibrillation: Secondary | ICD-10-CM

## 2021-01-22 DIAGNOSIS — F411 Generalized anxiety disorder: Secondary | ICD-10-CM | POA: Diagnosis not present

## 2021-01-22 DIAGNOSIS — K219 Gastro-esophageal reflux disease without esophagitis: Secondary | ICD-10-CM | POA: Diagnosis not present

## 2021-01-22 DIAGNOSIS — E119 Type 2 diabetes mellitus without complications: Secondary | ICD-10-CM

## 2021-01-22 LAB — POCT GLYCOSYLATED HEMOGLOBIN (HGB A1C): Hemoglobin A1C: 7.6 % — AB (ref 4.0–5.6)

## 2021-01-22 MED ORDER — OMEPRAZOLE 40 MG PO CPDR: 40.0000 mg | DELAYED_RELEASE_CAPSULE | Freq: Every day | ORAL | 3 refills | Status: DC

## 2021-01-22 NOTE — Patient Instructions (Addendum)
Try changing to 40mg  omeprazole daily and see how that goes.   Plan on recheck at a physical in about 6 months.   Take care.  Glad to see you. I would keep exercising but not change your meds yet.

## 2021-01-22 NOTE — Progress Notes (Signed)
This visit occurred during the SARS-CoV-2 public health emergency.  Safety protocols were in place, including screening questions prior to the visit, additional usage of staff PPE, and extensive cleaning of exam room while observing appropriate contact time as indicated for disinfecting solutions.  Diabetes:  Using medications without difficulties: yes Hypoglycemic episodes: no Hyperglycemic episodes: no Feet problems: no Blood Sugars averaging: usually ~ 150 at home.   eye exam within last year: yes A1c d/w pt at [facility].  He has been going to the gym 2-3 times per week.  Routine cautions d/w pt.    Stressors d/w pt.  His stepson and 2 grandkids moved in with patient in the meantime.  That was a change for the patient.  He has used xanax prn, esp since this change.    He had heart fluttering about 1 week ago.  He had cards f/u in the meantime.  No syncope.  No CP.    We talked about changing from zegerid to omeprazole.  Sx currently controlled.    Meds, vitals, and allergies reviewed.   ROS: Per HPI unless specifically indicated in ROS section   GEN: nad, alert and oriented HEENT: ncat NECK: supple w/o LA CV: rrr. PULM: ctab, no inc wob ABD: soft, +bs EXT: no edema SKIN: well perfused.

## 2021-01-26 NOTE — Assessment & Plan Note (Signed)
He had noted occasional fluttering about a week ago.  He had cardiology follow-up in the meantime.  I will defer.  No symptoms currently.  Still anticoagulated with Xarelto.

## 2021-01-26 NOTE — Assessment & Plan Note (Signed)
Continue as needed Xanax use and update me as needed.  He agrees with plan.

## 2021-01-26 NOTE — Assessment & Plan Note (Signed)
Reasonable to try changing Zegerid to omeprazole.  He can update me as needed.

## 2021-01-26 NOTE — Assessment & Plan Note (Signed)
A1c slightly higher but I think it makes sense to continue work on diet and exercise in the meantime instead of changing his medications.  Continue glipizide Jardiance and metformin.  Recheck periodically.  See after visit summary.

## 2021-02-02 ENCOUNTER — Encounter: Payer: Self-pay | Admitting: Family Medicine

## 2021-02-05 NOTE — Progress Notes (Signed)
° ° ° °Primary Cardiologist: Dr. Nishan ° °HPI: ° °Aaron Thompson is a 61 y.o. male with PAF s/p AF ablation , HTN, morbid obesity, OSA unable to tolerate CPAP, DM, LBBB and chronic systolic HF (EF 20-25%) due to NICM s/p STJ CRT-D (cath 2013 minimal CAD)   ° °We have not seen him since 12/19. He presents for f/u.  ° °Diagnosed with systolic HF in 2013. Cath at that time minimal CAD. Had STJ CRT-D placed.  ° °Presented to ER with new-onset AF 7/17.Had DCCV in ER.  Admitted the same month with ERAF. Felt to to have both AFib and ATach.  Subsequently seen by Dr. Allred and had afib ablation in 9/17. Subsequently had recurrent AF with RVR leading to ICD shocks. Placed back on tikosyn and anticoagulation.  ° °Echo  04/21/20 EF 25-30% mild RV reduction trivial MR moderate LAE ° °CRT-D gen change 5/21 ° °Saw Dr. Klein in 1/22 and being evaluated for symptomatic PVCs. On ICD interrogation in 2/22 burden was 1.3% ° °Some stress with stepson and 2 grand kids living with him now  ° °Still very much bothered by Arrhythmia Last month had multiple episodes of rapid palpitations °And one caused pre syncope He is taking MgOxide as well as his meds  ° ° ° °Past Medical History:  °Diagnosis Date  ° AICD (automatic cardioverter/defibrillator) present   ° Anxiety state, unspecified   ° Calculus of kidney 1981  ° "passed it on my own"  ° Chronic systolic CHF (congestive heart failure) ([facility])   ° Dermatophytosis of the body   ° DJD (degenerative joint disease)   ° Esophageal reflux   ° Hx of colonic polyps   ° Hypertension   ° LBBB (left bundle branch block)   ° intermittent  ° Non-ischemic cardiomyopathy ([facility])   ° a. cath 2013: minor nonobstructive CAD.  ° OSA (obstructive sleep apnea)   ° he can't tolerate CPAP.   ° Osteoarthrosis, unspecified whether generalized or localized, hand   ° Other chronic nonalcoholic liver disease   ° fatty liver  ° PAF (paroxysmal atrial fibrillation) ([facility])   ° a. s/p DCCV 6/11; previously on Pradaxa;  b.  Event Monitor 2012->No PAF;  c. 09/2011 s/p DCCV ->Xarelto started. d. 03/2014: inappropriate ICD shocks for AF-RVR, started on Tikosyn, Xarelto restarted.  ° Type II diabetes mellitus ([facility])   ° Unspecified hearing loss   ° no hearing aid  ° ° °Current Outpatient Medications  °Medication Sig Dispense Refill  ° acetaminophen (TYLENOL) 325 MG tablet Take 650 mg by mouth every 6 (six) hours as needed for mild pain or moderate pain.    ° ALPRAZolam (XANAX) 0.25 MG tablet TAKE 1 TABLET BY MOUTH AS NEEDED FOR ANXIETY 30 tablet 1  ° Blood Glucose Monitoring Suppl (ONE TOUCH ULTRA 2) w/Device KIT Use daily to check sugars as needed. Dx E11.9 1 kit 0  ° carvedilol (COREG) 25 MG tablet TAKE 2 TABLETS(50 MG) BY MOUTH TWICE DAILY WITH A MEAL 360 tablet 3  ° dofetilide (TIKOSYN) 500 MCG capsule TAKE 1 CAPSULE(500 MCG) BY MOUTH TWICE DAILY 180 capsule 2  ° ENTRESTO 97-103 MG TAKE 1 TABLET BY MOUTH TWICE DAILY 180 tablet 2  ° furosemide (LASIX) 40 MG tablet Take 1 tablet (40 mg total) by mouth every Friday. 5 tablet 11  ° glipiZIDE (GLUCOTROL XL) 10 MG 24 hr tablet TAKE 1 TABLET(10 MG) BY MOUTH DAILY WITH BREAKFAST 30 tablet 12  ° glucose blood (ONE TOUCH   TOUCH ULTRA TEST) test strip CHECK GLUCOSE TWICE DAILY AND AS NEEDED, DX. E11.65 (UNCONTROLLED DM) 100 each 5   JARDIANCE 25 MG TABS tablet TAKE 1 TABLET BY MOUTH EVERY DAY 90 tablet 1   loratadine (CLARITIN) 10 MG tablet Take 10 mg by mouth daily as needed for allergies.     Magnesium Oxide 400 MG CAPS Take 1 capsule (400 mg total) by mouth daily. 90 capsule 3   metFORMIN (GLUCOPHAGE-XR) 500 MG 24 hr tablet TAKE 3 TABLETS(1500 MG) BY MOUTH DAILY WITH BREAKFAST 270 tablet 1   Multiple Vitamin (MULTIVITAMIN) tablet Take 1 tablet by mouth daily.     omeprazole (PRILOSEC) 40 MG capsule Take 1 capsule (40 mg total) by mouth daily. 90 capsule 3   Pitavastatin Calcium (LIVALO) 1 MG TABS TAKE 1 TABLET BY MOUTH EVERY OTHER DAY 45 tablet 3   potassium chloride SA (KLOR-CON) 20 MEQ tablet  TAKE 2 TABLETS(40 MEQ) BY MOUTH TWICE DAILY 360 tablet 3   spironolactone (ALDACTONE) 25 MG tablet TAKE 1 TABLET(25 MG) BY MOUTH DAILY 90 tablet 1   traMADol (ULTRAM) 50 MG tablet TAKE 1 TABLET(50 MG) BY MOUTH EVERY 12 HOURS AS NEEDED FOR PAIN 60 tablet 2   XARELTO 20 MG TABS tablet TAKE 1 TABLET(20 MG) BY MOUTH DAILY WITH SUPPER 30 tablet 5   No current facility-administered medications for this visit.    Allergies  Allergen Reactions   Atorvastatin     Muscle pain       Social History   Socioeconomic History   Marital status: Married    Spouse name: Not on file   Number of children: 2   Years of education: Not on file   Highest education level: Not on file  Occupational History   Occupation: PAINTER    Employer: UNEMPLOYED  Tobacco Use   Smoking status: Never   Smokeless tobacco: Former    Types: Snuff    Quit date: 10/12/2004   Tobacco comments:    02/14/2013 "quit snuff in 2006"  Vaping Use   Vaping Use: Never used  Substance and Sexual Activity   Alcohol use: No    Alcohol/week: 0.0 standard drinks   Drug use: No   Sexual activity: Yes  Other Topics Concern   Not on file  Social History Narrative   Lives in [place] with wife.  Works @ [employer] in [employer].   Married 1990   Social Determinants of Radio broadcast assistant Strain: Low Risk    Difficulty of Paying Living Expenses: Not hard at all  Food Insecurity: No Food Insecurity   Worried About Charity fundraiser in the Last Year: Never true   Arboriculturist in the Last Year: Never true  Transportation Needs: No Transportation Needs   Lack of Transportation (Medical): No   Lack of Transportation (Non-Medical): No  Physical Activity: Insufficiently Active   Days of Exercise per Week: 7 days   Minutes of Exercise per Session: 20 min  Stress: No Stress Concern Present   Feeling of Stress : Not at all  Social Connections: Not on file  Intimate Partner Violence: Not At Risk   Fear of  Current or Ex-Partner: No   Emotionally Abused: No   Physically Abused: No   Sexually Abused: No      Family History  Problem Relation Age of Onset   Hypertension Mother    Diabetes Mother    Heart failure Mother  Died @ 63   Hypertension Father    Heart failure Father        Died @ 56   Prostate cancer Father    Diabetes Brother    Kidney disease Brother    Diabetes Brother    Prostate cancer Brother    Diabetes Brother    Other Sister    Heart attack Paternal Grandfather    Stroke Paternal Grandmother    Colon cancer Paternal Grandmother    Multiple myeloma Sister    Leukemia Sister    Colon polyps Sister    Cirrhosis Maternal Uncle        alcohol related   Esophageal cancer Neg Hx    Stomach cancer Neg Hx    Pancreatic cancer Neg Hx     Vitals:   02/16/21 0818  BP: 112/70  Pulse: 84  SpO2: 97%  Weight: 289 lb (131.1 kg)  Height: 6' 4" (1.93 m)     PHYSICAL EXAM:  Large white male AICD under left clavicle JVP normal PMI enlarged no murmur Abdomen benign  Lungs clear No edema   ECG: NSR with v-pacing 80 QTS 528 ms (QRS is 132 so JT is 396)Personally reviewed  ASSESSMENT & PLAN: 1. Chronic systolic failure due to NICM s/p STJ CRT-D - cath 2013 non-obstructive CAD - EF 25-30% stable NYHA class 2  - Continue lasix 40 + kcl 20 on every Friday - ICD interrogated as above -  Entresto 97/103 bid, carvedilol 25 bid, spiro 25 daily. + Jardiance 54m daily  - If symptoms worsen can consider Barostim    2. PAF - s/p AF ablation - maintaining NSR on Tikosyn. [facility] Follow[facility] by Dr. KCaryl Comes- QT mildly prolonged but JT ok (no change from previous) - No bleeding on Xarelto   3. OSA - unable to tolerate CPAP - weight loss recommended.  - Discussed Inspiris device   4. DM2  - Continue Jardiance 25 - continue statin and Entresto  5. PVCs - ICD interrogation showed continued issues with ventricular arrhythmias He has had  ablations and on Tikosyn  will try to get him to see Dr KCaryl Comessoon. ? Add Mexiletine    F/U EP Dr KCaryl Comes6 months and me in a year   PJenkins Rouge MD  8:43 AM    .

## 2021-02-09 ENCOUNTER — Encounter: Payer: Self-pay | Admitting: Internal Medicine

## 2021-02-09 NOTE — Telephone Encounter (Signed)
Transmission received and reviewed. Normal device function. 2 brief PMT episodes noted during HS. Called patient to discuss as he requested. Patient states he was feeling his heart "skipping" which would take his breath. Resolved with additional 400mg  mag dose. Patient just wanting reassurance that PVCs had resolved. Patient is provided device clinic contact for additional questions or concerns that may arise.

## 2021-02-16 ENCOUNTER — Encounter: Payer: Self-pay | Admitting: Cardiovascular Disease

## 2021-02-16 ENCOUNTER — Other Ambulatory Visit: Payer: Self-pay

## 2021-02-16 ENCOUNTER — Ambulatory Visit (INDEPENDENT_AMBULATORY_CARE_PROVIDER_SITE_OTHER): Payer: HMO | Admitting: Cardiovascular Disease

## 2021-02-16 VITALS — BP 112/70 | HR 84 | Ht 76.0 in | Wt 289.0 lb

## 2021-02-16 DIAGNOSIS — Z95810 Presence of automatic (implantable) cardiac defibrillator: Secondary | ICD-10-CM

## 2021-02-16 DIAGNOSIS — I4891 Unspecified atrial fibrillation: Secondary | ICD-10-CM

## 2021-02-16 DIAGNOSIS — I493 Ventricular premature depolarization: Secondary | ICD-10-CM | POA: Diagnosis not present

## 2021-02-16 DIAGNOSIS — I428 Other cardiomyopathies: Secondary | ICD-10-CM

## 2021-02-16 NOTE — Patient Instructions (Addendum)
Medication Instructions:  *If you need a refill on your cardiac medications before your next appointment, please call your pharmacy*  Lab Work: If you have labs (blood work) drawn today and your tests are completely normal, you will receive your results only by: [place] (if you have MyChart) OR A paper copy in the mail If you have any lab test that is abnormal or we need to change your treatment, we will call you to review the results.  Testing/Procedures: None ordered  Follow-Up: At [facility], you and your health needs are our priority.  As part of our continuing mission to provide you with exceptional heart care, we have created designated Provider Care Teams.  These Care Teams include your primary Cardiologist (physician) and Advanced Practice Providers (APPs -  Physician Assistants and Nurse Practitioners) who all work together to provide you with the care you need, when you need it.  We recommend signing up for the patient portal called "MyChart".  Sign up information is provided on this After Visit Summary.  MyChart is used to connect with patients for Virtual Visits (Telemedicine).  Patients are able to view lab/test results, encounter notes, upcoming appointments, etc.  Non-urgent messages can be sent to your provider as well.   To learn more about what you can do with MyChart, go to [email or web address].    Your next appointment:   12 month(s)  The format for your next appointment:   In Person  Provider:   Jenkins Rouge, MD {  \ Your physician recommends that you schedule a follow-up appointment as soon as possible with Dr. Caryl Comes or EP APP.

## 2021-02-19 ENCOUNTER — Other Ambulatory Visit: Payer: Self-pay | Admitting: Family Medicine

## 2021-02-19 ENCOUNTER — Encounter: Payer: Self-pay | Admitting: Family Medicine

## 2021-02-20 NOTE — Telephone Encounter (Signed)
Refill request for tramadol 50 mg tab  LOV - 01/22/21 Next OV - 06/25/21 Last refill - 11/25/20 #60/2

## 2021-03-04 ENCOUNTER — Ambulatory Visit (INDEPENDENT_AMBULATORY_CARE_PROVIDER_SITE_OTHER): Payer: HMO

## 2021-03-04 DIAGNOSIS — Z95810 Presence of automatic (implantable) cardiac defibrillator: Secondary | ICD-10-CM | POA: Diagnosis not present

## 2021-03-04 DIAGNOSIS — I428 Other cardiomyopathies: Secondary | ICD-10-CM | POA: Diagnosis not present

## 2021-03-04 LAB — CUP PACEART REMOTE DEVICE CHECK
Battery Remaining Longevity: 61 mo
Battery Remaining Percentage: 70 %
Battery Voltage: 2.98 V
Brady Statistic AP VP Percent: 1 %
Brady Statistic AP VS Percent: 1 %
Brady Statistic AS VP Percent: 98 %
Brady Statistic AS VS Percent: 1 %
Brady Statistic RA Percent Paced: 1 %
Date Time Interrogation Session: 20230208023615
HighPow Impedance: 86 Ohm
HighPow Impedance: 86 Ohm
Implantable Lead Implant Date: 20150121
Implantable Lead Implant Date: 20150121
Implantable Lead Implant Date: 20150121
Implantable Lead Location: 753858
Implantable Lead Location: 753859
Implantable Lead Location: 753860
Implantable Lead Model: 7122
Implantable Pulse Generator Implant Date: 20210512
Lead Channel Impedance Value: 480 Ohm
Lead Channel Impedance Value: 510 Ohm
Lead Channel Impedance Value: 590 Ohm
Lead Channel Pacing Threshold Amplitude: 0.875 V
Lead Channel Pacing Threshold Amplitude: 1.25 V
Lead Channel Pacing Threshold Amplitude: 1.375 V
Lead Channel Pacing Threshold Pulse Width: 0.4 ms
Lead Channel Pacing Threshold Pulse Width: 0.4 ms
Lead Channel Pacing Threshold Pulse Width: 0.5 ms
Lead Channel Sensing Intrinsic Amplitude: 12 mV
Lead Channel Sensing Intrinsic Amplitude: 5 mV
Lead Channel Setting Pacing Amplitude: 1.875
Lead Channel Setting Pacing Amplitude: 2.25 V
Lead Channel Setting Pacing Amplitude: 2.375
Lead Channel Setting Pacing Pulse Width: 0.4 ms
Lead Channel Setting Pacing Pulse Width: 0.5 ms
Lead Channel Setting Sensing Sensitivity: 0.5 mV
Pulse Gen Serial Number: 9890691

## 2021-03-08 ENCOUNTER — Other Ambulatory Visit: Payer: Self-pay | Admitting: Family Medicine

## 2021-03-09 NOTE — Progress Notes (Signed)
Remote ICD transmission.   

## 2021-03-09 NOTE — Telephone Encounter (Signed)
Refill request for ALPRAZOLAM 0.25MG  TABLETS  LOV - 01/22/21 Next OV - 06/25/21 Last refill - 12/24/20 #30/1

## 2021-03-10 NOTE — Telephone Encounter (Signed)
Sent. Thanks.   

## 2021-03-11 ENCOUNTER — Telehealth: Payer: Self-pay

## 2021-03-11 NOTE — Progress Notes (Addendum)
Chronic Care Management Pharmacy Assistant   Name: Aaron Thompson  MRN: [id number] DOB: August 23, 1960  Reason for Encounter: CCM (Hyperlipidemia Disease State)   Recent office visits:  01/22/2021 - Elsie Stain, MD - Patient presented for diabetes. Labs: A1c. Start: omeprazole (PRILOSEC) 40 MG capsule. Stop: Omeprazole-Sodium Bicarbonate (ZEGERID) 20-1100 MG CAPS capsule.   Recent consult visits:  02/16/2021 - Cardiology - Patient presented for Nonischemic cardiomyopathy. No medication changes.  01/15/2021 - Cardiology - Patient presented for Nonischemic Cardiomyopathy. Labs: BMP and Magnesium. Ordered: Cup paceart inclinic device check and EKG.    Hospital visits:  None in previous 6 months  Medications: Outpatient Encounter Medications as of 03/11/2021  Medication Sig   acetaminophen (TYLENOL) 325 MG tablet Take 650 mg by mouth every 6 (six) hours as needed for mild pain or moderate pain.   ALPRAZolam (XANAX) 0.25 MG tablet TAKE 1 TABLET BY MOUTH DAILY AS NEEDED FOR ANXIETY   Blood Glucose Monitoring Suppl (ONE TOUCH ULTRA 2) w/Device KIT Use daily to check sugars as needed. Dx E11.9   carvedilol (COREG) 25 MG tablet TAKE 2 TABLETS(50 MG) BY MOUTH TWICE DAILY WITH A MEAL   dofetilide (TIKOSYN) 500 MCG capsule TAKE 1 CAPSULE(500 MCG) BY MOUTH TWICE DAILY   ENTRESTO 97-103 MG TAKE 1 TABLET BY MOUTH TWICE DAILY   furosemide (LASIX) 40 MG tablet Take 1 tablet (40 mg total) by mouth every Friday.   glipiZIDE (GLUCOTROL XL) 10 MG 24 hr tablet TAKE 1 TABLET(10 MG) BY MOUTH DAILY WITH BREAKFAST   glucose blood (ONE TOUCH ULTRA TEST) test strip CHECK GLUCOSE TWICE DAILY AND AS NEEDED, DX. E11.65 (UNCONTROLLED DM)   JARDIANCE 25 MG TABS tablet TAKE 1 TABLET BY MOUTH EVERY DAY   loratadine (CLARITIN) 10 MG tablet Take 10 mg by mouth daily as needed for allergies.   Magnesium Oxide 400 MG CAPS Take 1 capsule (400 mg total) by mouth daily.   metFORMIN (GLUCOPHAGE-XR) 500 MG 24 hr tablet TAKE 3  TABLETS(1500 MG) BY MOUTH DAILY WITH BREAKFAST   Multiple Vitamin (MULTIVITAMIN) tablet Take 1 tablet by mouth daily.   omeprazole (PRILOSEC) 40 MG capsule Take 1 capsule (40 mg total) by mouth daily.   Pitavastatin Calcium (LIVALO) 1 MG TABS TAKE 1 TABLET BY MOUTH EVERY OTHER DAY   potassium chloride SA (KLOR-CON) 20 MEQ tablet TAKE 2 TABLETS(40 MEQ) BY MOUTH TWICE DAILY   spironolactone (ALDACTONE) 25 MG tablet TAKE 1 TABLET(25 MG) BY MOUTH DAILY   traMADol (ULTRAM) 50 MG tablet TAKE 1 TABLET(50 MG) BY MOUTH EVERY 12 HOURS AS NEEDED FOR PAIN   XARELTO 20 MG TABS tablet TAKE 1 TABLET(20 MG) BY MOUTH DAILY WITH SUPPER   No facility-administered encounter medications on file as of 03/11/2021.   Lipid Panel    Component Value Date/Time   CHOL 162 05/27/2020 1222   TRIG 140.0 05/27/2020 1222   HDL 34.60 (L) 05/27/2020 1222   LDLCALC 100 (H) 05/27/2020 1222   LDLDIRECT 97.0 06/16/2016 0755    10-year ASCVD risk score: The 10-year ASCVD risk score (Arnett DK, et al., 2019) is: 14.7%   Values used to calculate the score:     Age: 61 years     Sex: Male     Is Non-Hispanic African American: No     Diabetic: Yes     Tobacco smoker: No     Systolic Blood Pressure: 229 mmHg     Is BP treated: Yes     HDL Cholesterol: 34.6  mg/dL     Total Cholesterol: 162 mg/dL  Contacted patient on 03/11/2021 to discuss hyperlipidemia.  Current antihyperlipidemic regimen:  Livalo 1 mg - 1 tablet every other daily (max tolerated dose) Previous antihyperlipidemic medications tried: Lipitor - muscle aches, pt tried daily Livalo and had muscle aches   ASCVD risk enhancing conditions: DM, HTN, CHF, and current smoker  What recent interventions/DTPs have been made by any provider to improve Cholesterol control since last CPP Visit:  No recent interventions.   Any recent hospitalizations or ED visits since last visit with CPP? No  What diet changes have been made to improve Cholesterol?  Patient watches  how much salt he eats and watches what he eats.   What exercise is being done to improve Cholesterol?  Patient works out three times a week at [employer].   Adherence Review: Does the patient have >5 day gap between last estimated fill dates? No  Annual wellness visit in last year? Yes 07/03/2020 Most Recent BP reading: 112/70 on 02/16/2021  If Diabetic: Most recent A1C reading: 7.6 on 01/22/2021 Last eye exam / retinopathy screening: Up to date Last diabetic foot exam: Up to date  Upcoming appointments: No appointments scheduled within the next 30 days.  [name] Medication Name/Dose: Last fill date Days supply Pitavastatin 1 mg  01/07/2021 90   Metformin 500 mg  12/30/2020 90 Glipizide 10 mg  12/24/2020 90 Jardiance 25 mg  01/29/2021 30 Entresto 97-103 mg  01/29/2021 [place], CPP notified  Marijean Niemann, [place] Pharmacy Assistant [phone]  I have reviewed the care management and care coordination activities outlined in this encounter and I am certifying that I agree with the content of this note. No further action required.  [name], PharmD Clinical Pharmacist [facility] at [facility] [phone]

## 2021-03-16 ENCOUNTER — Other Ambulatory Visit: Payer: Self-pay | Admitting: Family Medicine

## 2021-03-30 ENCOUNTER — Other Ambulatory Visit: Payer: Self-pay

## 2021-03-30 ENCOUNTER — Ambulatory Visit (INDEPENDENT_AMBULATORY_CARE_PROVIDER_SITE_OTHER): Payer: HMO | Admitting: Internal Medicine

## 2021-03-30 ENCOUNTER — Encounter: Payer: Self-pay | Admitting: Internal Medicine

## 2021-03-30 VITALS — BP 132/74 | HR 94 | Ht 76.0 in | Wt 288.4 lb

## 2021-03-30 DIAGNOSIS — I428 Other cardiomyopathies: Secondary | ICD-10-CM

## 2021-03-30 DIAGNOSIS — I5022 Chronic systolic (congestive) heart failure: Secondary | ICD-10-CM

## 2021-03-30 DIAGNOSIS — I4891 Unspecified atrial fibrillation: Secondary | ICD-10-CM

## 2021-03-30 DIAGNOSIS — Z95810 Presence of automatic (implantable) cardiac defibrillator: Secondary | ICD-10-CM | POA: Diagnosis not present

## 2021-03-30 MED ORDER — DILTIAZEM HCL 30 MG PO TABS: ORAL_TABLET | ORAL | 0 refills | Status: DC

## 2021-03-30 NOTE — Patient Instructions (Signed)
Medication Instructions:  ?Your physician has recommended you make the following change in your medication:  ? ?** Begin Diltiazem '30mg'$  - 1 tablet as needed.  May repeat dose again in 4 hours if needed x 1 dose ? ?*If you need a refill on your cardiac medications before your next appointment, please call your pharmacy* ? ? ?Lab Work: ?None ordered. ? ?If you have labs (blood work) drawn today and your tests are completely normal, you will receive your results only by: ?MyChart Message (if you have MyChart) OR ?A paper copy in the mail ?If you have any lab test that is abnormal or we need to change your treatment, we will call you to review the results. ? ? ?Testing/Procedures: ?None ordered. ? ? ? ?Follow-Up: ?At [facility], you and your health needs are our priority.  As part of our continuing mission to provide you with exceptional heart care, we have created designated Provider Care Teams.  These Care Teams include your primary Cardiologist (physician) and Advanced Practice Providers (APPs -  Physician Assistants and Nurse Practitioners) who all work together to provide you with the care you need, when you need it. ? ?We recommend signing up for the patient portal called "MyChart".  Sign up information is provided on this After Visit Summary.  MyChart is used to connect with patients for Virtual Visits (Telemedicine).  Patients are able to view lab/test results, encounter notes, upcoming appointments, etc.  Non-urgent messages can be sent to your provider as well.   ?To learn more about what you can do with MyChart, go to [email or web address].   ? ?Your next appointment:   ?6 months with Dr Olin Pia PA ?

## 2021-03-30 NOTE — Progress Notes (Signed)
? ? ? ? ? ?Patient Care Team: ?[name], MD as PCP - General (Family Medicine) ?[name], MD as PCP - Cardiology (Cardiology) ?Deboraha Sprang, MD as PCP - Electrophysiology (Cardiology) ?[name], [facility] as Pharmacist (Pharmacist) ?All ? ?HPI ? ?Aaron Thompson is a 61 y.o. male ?Seen in followup for CRT- ICD implanted for nonischemic cardiomyopathy and left bundle branch block.  He was noted on  his device to have atrial fibrillation and he was started on Rivaroxaban for stroke reduction prophylaxis  ? ?Chose to stop his anticoagulation and to return to work. March 2016 he had ICD shocks associated with very rapid atrial fibrillation. ? ?Rx w dofetilide therapy  And Rivaroxaban; underwent catheter ablation of his atrial fibrillation (JA) 9/17 ?The patient denies chest pain, shortness of breath , nocturnal dyspnea , orthopnea  or peripheral edema.  There have been no syncope .  Complains of palpitations which have been variable and have been identified with PVC, at their most frequent based on device estimates about 3% ?Assoc with neck throbbing and sense of dyspnea.  ?   ?DATE TEST  EF    ?9/17 Echo  40 %    ?11/18  Echo   20-25%     ? 1/20   Echo  25-30%    ?3/22 Echo  25-30%   ?  ?  ?  ?Date Cr K Hgb Mg  ?3/21 0.69 4.7 15.7   ? 5/21 0.74 4.7 15.9   ?6/22 0.66 4.3  2.5  ?12/22 0.80 4.7    ?  ? ? ?Past Medical History:  ?Diagnosis Date  ? AICD (automatic cardioverter/defibrillator) present   ? Anxiety state, unspecified   ? Calculus of kidney 1981  ? "passed it on my own"  ? Chronic systolic CHF (congestive heart failure) ([place])   ? Dermatophytosis of the body   ? DJD (degenerative joint disease)   ? Esophageal reflux   ? Hx of colonic polyps   ? Hypertension   ? LBBB (left bundle branch block)   ? intermittent  ? Non-ischemic cardiomyopathy ([place])   ? a. cath 2013: minor nonobstructive CAD.  ? OSA (obstructive sleep apnea)   ? he can't tolerate CPAP.   ? Osteoarthrosis, unspecified whether  generalized or localized, hand   ? Other chronic nonalcoholic liver disease   ? fatty liver  ? PAF (paroxysmal atrial fibrillation) ([place])   ? a. s/p DCCV 6/11; previously on Pradaxa;  b. Event Monitor 2012->No PAF;  c. 09/2011 s/p DCCV ->Xarelto started. d. 03/2014: inappropriate ICD shocks for AF-RVR, started on Tikosyn, Xarelto restarted.  ? Type II diabetes mellitus ([place])   ? Unspecified hearing loss   ? no hearing aid  ? ? ?Past Surgical History:  ?Procedure Laterality Date  ? Abdominal US  01/2007  ? Fatty liver, no gallstones  ? APPENDECTOMY    ? ATRIAL TACH ABLATION  09/2015  ? BI-VENTRICULAR IMPLANTABLE CARDIOVERTER DEFIBRILLATOR [name] 02/14/2013  ? STJ CRTD implanted by Dr Caryl Comes  ? BILATERAL KNEE ARTHROSCOPY    ? BIV ICD GENERATOR CHANGEOUT [name] 06/06/2019  ? Procedure: BIV ICD GENERATOR CHANGEOUT;  Surgeon: Deboraha Sprang, MD;  Location: [facility];  Service: Cardiovascular;  Laterality: [name];  ? CARDIOVERSION  09/29/2011  ? Procedure: CARDIOVERSION;  Surgeon: Thayer Headings, MD;  Location: [facility];  Service: Cardiovascular;  Laterality: [name];  ? COLONOSCOPY WITH PROPOFOL [name] 10/26/2016  ? Procedure: COLONOSCOPY WITH PROPOFOL;  Surgeon: Henrene Pastor,  Docia Chuck, MD;  Location: [facility];  Service: Endoscopy;  Laterality: [name];  ? DOPPLER ECHOCARDIOGRAPHY  11/2009  ? Decreased EF of 35-40%  ? ELECTROPHYSIOLOGIC STUDY [name] 10/14/2015  ? Procedure: Atrial Fibrillation Ablation;  Surgeon: [name], MD;  Location: [facility];  Service: Cardiovascular;  Laterality: [name];  ? LEFT HEART CATHETERIZATION WITH CORONARY ANGIOGRAM [name] 08/18/2011  ? Procedure: LEFT HEART CATHETERIZATION WITH CORONARY ANGIOGRAM;  Surgeon: Peter M Martinique, MD;  Location: [facility];  Service: Cardiovascular;  Laterality: [name];  ? NECK SURGERY    ? plate/fusion  ? Stress Cardiolite  08/2005  ? Normal, EF 42%  ? UPPER GASTROINTESTINAL ENDOSCOPY  08/2006  ? GERD  ? ? ?Current Outpatient Medications  ?Medication Sig Dispense Refill  ? acetaminophen  (TYLENOL) 325 MG tablet Take 650 mg by mouth every 6 (six) hours as needed for mild pain or moderate pain.    ? ALPRAZolam (XANAX) 0.25 MG tablet TAKE 1 TABLET BY MOUTH DAILY AS NEEDED FOR ANXIETY 30 tablet 1  ? Blood Glucose Monitoring Suppl (ONE TOUCH ULTRA 2) w/Device KIT Use daily to check sugars as needed. Dx E11.9 1 kit 0  ? carvedilol (COREG) 25 MG tablet TAKE 2 TABLETS(50 MG) BY MOUTH TWICE DAILY WITH A MEAL 360 tablet 3  ? dofetilide (TIKOSYN) 500 MCG capsule TAKE 1 CAPSULE(500 MCG) BY MOUTH TWICE DAILY 180 capsule 2  ? ENTRESTO 97-103 MG TAKE 1 TABLET BY MOUTH TWICE DAILY 180 tablet 2  ? furosemide (LASIX) 40 MG tablet Take 1 tablet (40 mg total) by mouth every Friday. 5 tablet 11  ? glipiZIDE (GLUCOTROL XL) 10 MG 24 hr tablet TAKE 1 TABLET(10 MG) BY MOUTH DAILY WITH BREAKFAST 30 tablet 12  ? glucose blood (ONE TOUCH ULTRA TEST) test strip CHECK GLUCOSE TWICE DAILY AND AS NEEDED, DX. E11.65 (UNCONTROLLED DM) 100 each 5  ? JARDIANCE 25 MG TABS tablet TAKE 1 TABLET BY MOUTH EVERY DAY 90 tablet 1  ? loratadine (CLARITIN) 10 MG tablet Take 10 mg by mouth daily as needed for allergies.    ? Magnesium Oxide 400 MG CAPS Take 1 capsule (400 mg total) by mouth daily. 90 capsule 3  ? metFORMIN (GLUCOPHAGE-XR) 500 MG 24 hr tablet TAKE 3 TABLETS(1500 MG) BY MOUTH DAILY WITH BREAKFAST 270 tablet 1  ? Multiple Vitamin (MULTIVITAMIN) tablet Take 1 tablet by mouth daily.    ? omeprazole (PRILOSEC) 40 MG capsule Take 1 capsule (40 mg total) by mouth daily. 90 capsule 3  ? Pitavastatin Calcium (LIVALO) 1 MG TABS TAKE 1 TABLET BY MOUTH EVERY OTHER DAY 45 tablet 3  ? potassium chloride SA (KLOR-CON) 20 MEQ tablet TAKE 2 TABLETS(40 MEQ) BY MOUTH TWICE DAILY 360 tablet 3  ? spironolactone (ALDACTONE) 25 MG tablet TAKE 1 TABLET(25 MG) BY MOUTH DAILY 90 tablet 1  ? traMADol (ULTRAM) 50 MG tablet TAKE 1 TABLET(50 MG) BY MOUTH EVERY 12 HOURS AS NEEDED FOR PAIN 60 tablet 2  ? XARELTO 20 MG TABS tablet TAKE 1 TABLET(20 MG) BY MOUTH  DAILY WITH SUPPER 30 tablet 5  ? ?No current facility-administered medications for this visit.  ? ? ?Allergies  ?Allergen Reactions  ? Atorvastatin   ?  Muscle pain   ? ? ?Review of Systems negative except from HPI and PMH ? ?Physical Exam ? ?BP 132/74   Pulse 94   Ht '6\' 4"'  (1.93 m)   Wt 288 lb 6.4 oz (130.8 kg)   SpO2 96%  BMI 35.11 kg/m?  ?Well developed and Morbidly obese in no acute distress ?HENT normal ?Neck supple with JVP-flat\ ?Clear ?Device pocket well healed; without hematoma or erythema.  There is no tethering   ?Regular rate and rhythm, no  gallop No  murmur ?Abd-soft with active BS ?No Clubbing cyanosis   edema ?Skin-warm and dry ?A & Oriented  Grossly normal sensory and motor function ? ?ECG sinus with P-synchronous/ AV  pacing at  94  ? ? ?Assessment and Plan: ? ?Nonischemic cardiomyopathy ? ?Congestive heart failure- chronic-systolic ? ?PVC RBBB inferior axis  ? ?Hypertension ? ?Implantable defibrillator-CRT  ? ?Sleep disorder breathing ? ?Atrial fibrillation s/p PVI-JA 2017 ? ?Inappropriate ICD discharges ? ?No interval atrial fibrillation..  continue dofetilide 500 mcg bid   Surveillance labs and QTc are within range ?PVCs have been variable.  He is on dofetilide and Mg which are helping not enough.  Alternatives are limited  we could use amio, but reluctant in this young man.  Have suggested we try low dose CCB on an as needed basis.  Rx dilt 30 mg and repeat x1 in 4 hrs. ? ?Also reassured him that these PVCs are not harmful, at least not at this frequency.  Moreover, this frequency makes catheter ablation not an option ? ?No bleeding continue Rivaroxaban 20 mg daily ? ?Euvolemic  continue furosemide 20 a week and spiro 25 qd ?Continue entresto 97/103 and carvedilol 25 bid  ? ?No bleeding.  Continue him on Xarelto 20 mg daily. ? ?Euvolemic.  We will continue him on Lasix 20 mg a week and 25 mg spironolactone daily ? ?PVC burden as measured by his device is less than 1% ? ?  ? ? ?

## 2021-04-07 ENCOUNTER — Other Ambulatory Visit (HOSPITAL_COMMUNITY): Payer: Self-pay

## 2021-04-07 ENCOUNTER — Telehealth (HOSPITAL_COMMUNITY): Payer: Self-pay | Admitting: Pharmacy Technician

## 2021-04-07 NOTE — Telephone Encounter (Signed)
Advanced Heart Failure Patient Advocate Encounter ? ?The patient was approved for a [employer] that will help cover the cost of Jardiance and Entresto. Total amount awarded, $10,000. Eligibility dates, 03/08/2021-03/07/2022. ? ?ID [id number] ?   ?BIN [id number] ?  ?PCN [id number] ?  ?GROUP [id number] ? ?Patient was given a copy of the grant information to take to the pharmacy. ? ?Charlann Boxer, CPhT ? ?

## 2021-04-14 ENCOUNTER — Other Ambulatory Visit (HOSPITAL_COMMUNITY): Payer: Self-pay | Admitting: Pharmacist

## 2021-04-14 NOTE — Telephone Encounter (Signed)
Entered in error

## 2021-04-22 ENCOUNTER — Telehealth: Payer: Self-pay

## 2021-04-22 NOTE — Progress Notes (Signed)
? ? ?  Chronic Care Management ?Pharmacy Assistant  ? ?Name: Aaron Thompson  MRN: [id number] DOB: 1960-03-31 ? ?Reason for Encounter: CCM Counsellor) ?  ?Medications: ?Outpatient Encounter Medications as of 04/22/2021  ?Medication Sig  ? acetaminophen (TYLENOL) 325 MG tablet Take 650 mg by mouth every 6 (six) hours as needed for mild pain or moderate pain.  ? ALPRAZolam (XANAX) 0.25 MG tablet TAKE 1 TABLET BY MOUTH DAILY AS NEEDED FOR ANXIETY  ? Blood Glucose Monitoring Suppl (ONE TOUCH ULTRA 2) w/Device KIT Use daily to check sugars as needed. Dx E11.9  ? carvedilol (COREG) 25 MG tablet TAKE 2 TABLETS(50 MG) BY MOUTH TWICE DAILY WITH A MEAL  ? diltiazem (CARDIZEM) 30 MG tablet Take 1 tablet as needed.  May repeat dose in 4 hours x 1 if needed.  ? dofetilide (TIKOSYN) 500 MCG capsule TAKE 1 CAPSULE(500 MCG) BY MOUTH TWICE DAILY  ? ENTRESTO 97-103 MG TAKE 1 TABLET BY MOUTH TWICE DAILY  ? furosemide (LASIX) 40 MG tablet Take 1 tablet (40 mg total) by mouth every Friday.  ? glipiZIDE (GLUCOTROL XL) 10 MG 24 hr tablet TAKE 1 TABLET(10 MG) BY MOUTH DAILY WITH BREAKFAST  ? glucose blood (ONE TOUCH ULTRA TEST) test strip CHECK GLUCOSE TWICE DAILY AND AS NEEDED, DX. E11.65 (UNCONTROLLED DM)  ? JARDIANCE 25 MG TABS tablet TAKE 1 TABLET BY MOUTH EVERY DAY  ? loratadine (CLARITIN) 10 MG tablet Take 10 mg by mouth daily as needed for allergies.  ? Magnesium Oxide 400 MG CAPS Take 1 capsule (400 mg total) by mouth daily.  ? metFORMIN (GLUCOPHAGE-XR) 500 MG 24 hr tablet TAKE 3 TABLETS(1500 MG) BY MOUTH DAILY WITH BREAKFAST  ? Multiple Vitamin (MULTIVITAMIN) tablet Take 1 tablet by mouth daily.  ? omeprazole (PRILOSEC) 40 MG capsule Take 1 capsule (40 mg total) by mouth daily.  ? Pitavastatin Calcium (LIVALO) 1 MG TABS TAKE 1 TABLET BY MOUTH EVERY OTHER DAY  ? potassium chloride SA (KLOR-CON) 20 MEQ tablet TAKE 2 TABLETS(40 MEQ) BY MOUTH TWICE DAILY  ? spironolactone (ALDACTONE) 25 MG tablet TAKE 1 TABLET(25 MG) BY MOUTH  DAILY  ? traMADol (ULTRAM) 50 MG tablet TAKE 1 TABLET(50 MG) BY MOUTH EVERY 12 HOURS AS NEEDED FOR PAIN  ? XARELTO 20 MG TABS tablet TAKE 1 TABLET(20 MG) BY MOUTH DAILY WITH SUPPER  ? ?No facility-administered encounter medications on file as of 04/22/2021.  ? ?[name] was contacted to remind of upcoming telephone visit with Charlene Brooke on 04/28/2021 at 11:00. Patient had an eye doctor appointment on same day at same time. Patient has been rescheduled for 05/27/2021 at 11:00 for a telephone appointment.  ? ?[name]: ?Medication:  Last Fill: Day Supply ?Pitavastatin 1 mg 01/07/2021 45                      ?Metformin 500 mg 12/30/2020 90 ?Glipizide 10 mg 12/24/2020 90 ?Jardiance 25 mg 01/29/2021 30   ?Entresto 97-103 mg 01/29/2021      30 ? ?Charlene Brooke, CPP notified ? ?Marijean Niemann, RMA ?Clinical Pharmacy Assistant ?[phone] ? ? ? ? ?

## 2021-04-26 ENCOUNTER — Other Ambulatory Visit: Payer: Self-pay | Admitting: Family Medicine

## 2021-04-28 ENCOUNTER — Telehealth: Payer: HMO

## 2021-04-28 DIAGNOSIS — E119 Type 2 diabetes mellitus without complications: Secondary | ICD-10-CM | POA: Diagnosis not present

## 2021-04-28 LAB — HM DIABETES EYE EXAM

## 2021-05-05 ENCOUNTER — Other Ambulatory Visit: Payer: Self-pay | Admitting: Family Medicine

## 2021-05-18 DIAGNOSIS — M545 Low back pain, unspecified: Secondary | ICD-10-CM | POA: Diagnosis not present

## 2021-05-22 ENCOUNTER — Telehealth: Payer: Self-pay

## 2021-05-22 ENCOUNTER — Other Ambulatory Visit: Payer: Self-pay | Admitting: Family Medicine

## 2021-05-22 NOTE — Progress Notes (Signed)
? ? ?  Chronic Care Management ?Pharmacy Assistant  ? ?Name: Aaron Thompson  MRN: [id number] DOB: 10-30-1960 ? ?Reason for Encounter: CCM Counsellor) ?  ?Medications: ?Outpatient Encounter Medications as of 05/22/2021  ?Medication Sig  ? acetaminophen (TYLENOL) 325 MG tablet Take 650 mg by mouth every 6 (six) hours as needed for mild pain or moderate pain.  ? ALPRAZolam (XANAX) 0.25 MG tablet TAKE 1 TABLET BY MOUTH DAILY AS NEEDED FOR ANXIETY  ? Blood Glucose Monitoring Suppl (ONE TOUCH ULTRA 2) w/Device KIT Use daily to check sugars as needed. Dx E11.9  ? carvedilol (COREG) 25 MG tablet TAKE 2 TABLETS(50 MG) BY MOUTH TWICE DAILY WITH A MEAL  ? diltiazem (CARDIZEM) 30 MG tablet Take 1 tablet as needed.  May repeat dose in 4 hours x 1 if needed.  ? dofetilide (TIKOSYN) 500 MCG capsule TAKE 1 CAPSULE(500 MCG) BY MOUTH TWICE DAILY  ? ENTRESTO 97-103 MG TAKE 1 TABLET BY MOUTH TWICE DAILY  ? furosemide (LASIX) 40 MG tablet Take 1 tablet (40 mg total) by mouth every Friday.  ? glipiZIDE (GLUCOTROL XL) 10 MG 24 hr tablet TAKE 1 TABLET(10 MG) BY MOUTH DAILY WITH BREAKFAST  ? JARDIANCE 25 MG TABS tablet TAKE 1 TABLET BY MOUTH EVERY DAY  ? loratadine (CLARITIN) 10 MG tablet Take 10 mg by mouth daily as needed for allergies.  ? Magnesium Oxide 400 MG CAPS Take 1 capsule (400 mg total) by mouth daily.  ? metFORMIN (GLUCOPHAGE-XR) 500 MG 24 hr tablet TAKE 3 TABLETS(1500 MG) BY MOUTH DAILY WITH BREAKFAST  ? Multiple Vitamin (MULTIVITAMIN) tablet Take 1 tablet by mouth daily.  ? omeprazole (PRILOSEC) 40 MG capsule Take 1 capsule (40 mg total) by mouth daily.  ? ONETOUCH ULTRA test strip CHECK GLUCOSE TWICE DAILY AND AS NEEDED  ? Pitavastatin Calcium (LIVALO) 1 MG TABS TAKE 1 TABLET BY MOUTH EVERY OTHER DAY  ? potassium chloride SA (KLOR-CON) 20 MEQ tablet TAKE 2 TABLETS(40 MEQ) BY MOUTH TWICE DAILY  ? spironolactone (ALDACTONE) 25 MG tablet TAKE 1 TABLET(25 MG) BY MOUTH DAILY  ? traMADol (ULTRAM) 50 MG tablet TAKE 1  TABLET(50 MG) BY MOUTH EVERY 12 HOURS AS NEEDED FOR PAIN  ? XARELTO 20 MG TABS tablet TAKE 1 TABLET(20 MG) BY MOUTH DAILY WITH SUPPER  ? ?No facility-administered encounter medications on file as of 05/22/2021.  ? ?[name] was contacted to remind of upcoming telephone visit with Charlene Brooke on 005/03/2021 at 11:00. Patient was reminded to have any blood glucose and blood pressure readings available for review at appointment.  ? ?Patient confirmed appointment. ? ?Are you having any problems with your medications? No  ? ?Do you have any concerns you like to discuss with the pharmacist? No ? ?Patient will have blood glucose and blood sugar reading logs available for appointment.  ? ?[name]: ?Medication:  Last Fill: Day Supply ?Pitavastatin 1 mg 04/28/2021 30                              ?Metformin 500 mg 03/30/2021 90     ?Glipizide 10 mg 04/05/2021 90 ?Jardiance 25 mg 05/03/2021 90             ?Entresto 97-103 mg 04/28/2021 30 ?Fill dates verified with [facility] ?  ?Charlene Brooke, CPP notified ? ?Marijean Niemann, RMA ?Clinical Pharmacy Assistant ?[phone] ? ? ? ? ?

## 2021-05-25 NOTE — Telephone Encounter (Signed)
Refill request for ALPRAZOLAM 0.'25MG'$  TABLETS ? ?LOV - 01/02/21 ?Next OV - 06/25/21 ?Last refill - 03/10/21 #30/1 ? ?

## 2021-05-26 ENCOUNTER — Other Ambulatory Visit: Payer: Self-pay | Admitting: Cardiovascular Disease

## 2021-05-27 ENCOUNTER — Telehealth: Payer: Self-pay | Admitting: Pharmacist

## 2021-05-27 ENCOUNTER — Ambulatory Visit (INDEPENDENT_AMBULATORY_CARE_PROVIDER_SITE_OTHER): Payer: HMO | Admitting: Pharmacist

## 2021-05-27 DIAGNOSIS — I5022 Chronic systolic (congestive) heart failure: Secondary | ICD-10-CM

## 2021-05-27 DIAGNOSIS — I10 Essential (primary) hypertension: Secondary | ICD-10-CM

## 2021-05-27 DIAGNOSIS — E7800 Pure hypercholesterolemia, unspecified: Secondary | ICD-10-CM

## 2021-05-27 DIAGNOSIS — I4891 Unspecified atrial fibrillation: Secondary | ICD-10-CM

## 2021-05-27 DIAGNOSIS — E119 Type 2 diabetes mellitus without complications: Secondary | ICD-10-CM

## 2021-05-27 MED ORDER — PRAVASTATIN SODIUM 10 MG PO TABS: 10.0000 mg | ORAL_TABLET | Freq: Every day | ORAL | 3 refills | Status: DC

## 2021-05-27 MED ORDER — FREESTYLE LIBRE 2 READER DEVI: 0 refills | Status: DC

## 2021-05-27 MED ORDER — FREESTYLE LIBRE 2 SENSOR MISC: 5 refills | Status: DC

## 2021-05-27 NOTE — Telephone Encounter (Signed)
Patient is currently taking Livalo 1 mg every other day, he endorses mild muscle aches occasionally and cost is high ($100 per 30 days); he states he would like to try something else that is cheaper. ? ?Per chart review and patient report, he has tried atorvastatin and had myalgias. We discussed given ASCVD risk 19%, DM and HF, LDL goal < 70 is recommended. ? ?Lipid Panel  ?   ?Component Value Date/Time  ? CHOL 162 05/27/2020 1222  ? TRIG 140.0 05/27/2020 1222  ? HDL 34.60 (L) 05/27/2020 1222  ? CHOLHDL 5 05/27/2020 1222  ? VLDL 28.0 05/27/2020 1222  ? [place] 100 (H) 05/27/2020 1222  ? LDLDIRECT 97.0 06/16/2016 0755  ? ? ?Recommendation: ?Trial of Pravastatin 10 mg daily, with option to reduce to QOD if needed. ?Consulting with PCP regarding timing of change, given upcoming CPE 06/25/21 (labwork 5/25) ?

## 2021-05-27 NOTE — Progress Notes (Signed)
? ?Chronic Care Management ?Pharmacy Note ? ?05/27/2021 ?Name:  Aaron Thompson MRN:  [id number] DOB:  04-14-1960 ? ?Summary: CCM F/U visit ?-DM: A1c 7.6% (12/2020); recent fasting BG avg 140s, up to 160 (goal < 130). Pt would benefit from CGM for help with dietary decisions ?-HLD: LDL 100 (05/2020); pt is on Livalo 1 mg QOD but this is cost-prohibitive and pt would like to try something else that is cheaper; he has only tried atorvastatin so far ? ?Recommendations/Changes made from today's visit: ?-Start [place] 2 - scheduled office visit for training 5/8 ?-Recommend trial of pravastatin 10 mg daily (w/ option to reduce to QOD if needed) - consulting with PCP (see phone note) ? ?Plan: ?-Pharmacist follow up televisit scheduled for 1 week ?-PCP CPE 06/25/21 ? ? ? ?Subjective: ?Aaron Thompson is an 61 y.o. year old male who is a primary patient of [name], [name], MD.  The CCM team was consulted for assistance with disease management and care coordination needs.   ? ?Engaged with patient by telephone for follow up visit in response to provider referral for pharmacy case management and/or care coordination services.  ? ?Consent to Services:  ?The patient was given information about Chronic Care Management services, agreed to services, and gave verbal consent prior to initiation of services.  Please see initial visit note for detailed documentation.  ? ?Patient Care Team: ?[name], MD as PCP - General (Family Medicine) ?[name], MD as PCP - Cardiology (Cardiology) ?Deboraha Sprang, MD as PCP - Electrophysiology (Cardiology) ?Esau Fridman, Cleaster [place], [facility] as Pharmacist (Pharmacist) ? ?Recent office visits: ?01/22/21 Dr [name] OV: f/u DM. A1c 7.6% - work on lifestyle; switched zegerid to plain omeprazole 40 mg daily. RTC 6 months for CPE. ? ?Recent consult visits: ?03/30/21 Dr Caryl Comes (cardiology): f/u Afib, HF. Variable PVCs. Rx diltiazem 30 mg PRN ? ?02/16/21 Dr [name] (cardiology): f/u CHF, NICM,  Afib. ? ?01/15/21 PA Joesph July (cardiology): f/u NICM. BMP, MG stable. No changes. ? ?Hospital visits: ?None in previous 6 months ? ? ?Objective: ? ?Lab Results  ?Component Value Date  ? CREATININE 0.80 01/15/2021  ? BUN 17 01/15/2021  ? GFR 101.06 05/27/2020  ? EGFR 102 01/15/2021  ? GFRNONAA >60 07/07/2020  ? GFRAA 117 09/10/2019  ? NA 142 01/15/2021  ? K 4.7 01/15/2021  ? CALCIUM 9.3 01/15/2021  ? CO2 21 01/15/2021  ? GLUCOSE 117 (H) 01/15/2021  ? ? ?Lab Results  ?Component Value Date/Time  ? HGBA1C 7.6 (A) 01/22/2021 12:42 PM  ? HGBA1C 7.2 (H) 05/27/2020 12:22 PM  ? HGBA1C 7.8 (A) 02/28/2020 11:34 AM  ? HGBA1C 7.5 (H) 06/21/2019 09:45 AM  ? GFR 101.06 05/27/2020 12:22 PM  ? GFR 108.76 06/21/2019 09:45 AM  ? MICROALBUR 1.7 10/09/2010 11:36 AM  ?  ?Last diabetic Eye exam:  ?Lab Results  ?Component Value Date/Time  ? HMDIABEYEEXA No Retinopathy 04/07/2020 12:00 AM  ?  ?Last diabetic Foot exam: No results found for: HMDIABFOOTEX  ? ?Lab Results  ?Component Value Date  ? CHOL 162 05/27/2020  ? HDL 34.60 (L) 05/27/2020  ? LDLCALC 100 (H) 05/27/2020  ? LDLDIRECT 97.0 06/16/2016  ? TRIG 140.0 05/27/2020  ? CHOLHDL 5 05/27/2020  ? ? ? ?  Latest Ref Rng & Units 05/27/2020  ? 12:22 PM 06/21/2019  ?  9:45 AM 04/06/2019  ? 11:52 AM  ?Hepatic Function  ?Total Protein 6.0 - 8.3 g/dL 6.8   7.0   6.6    ?  Albumin 3.5 - 5.2 g/dL 4.3   4.5   4.2    ?AST 0 - 37 U/L _0 ?ALT 0 - 53 U/L _1 ?Alk Phosphatase 39 - 117 U/L 44   50   52    ?Total Bilirubin 0.2 - 1.2 mg/dL 0.5   0.6   0.6    ? ? ?Lab Results  ?Component Value Date/Time  ? TSH 2.09 07/05/2017 08:39 AM  ? TSH 1.380 12/14/2016 02:29 PM  ? FREET4 0.90 07/29/2015 06:44 AM  ? ? ? ?  Latest Ref Rng & Units 06/01/2019  ?  9:57 AM 04/06/2019  ? 11:52 AM 12/14/2016  ?  2:29 PM  ?CBC  ?WBC 3.4 - 10.8 x10E3/uL 6.1   7.1   8.2    ?Hemoglobin 13.0 - 17.7 g/dL 15.9   15.7   15.3    ?Hematocrit 37.5 - 51.0 % 47.0   46.7   46.1    ?Platelets 150 - 450 x10E3/uL 178    180.0   215    ? ? ?No results found for: VD25OH ? ?Clinical ASCVD: No  ?The 10-year ASCVD risk score (Arnett DK, et al., 2019) is: 19.3% ?  Values used to calculate the score: ?    Age: 48 years ?    Sex: Male ?    Is Non-Hispanic African American: No ?    Diabetic: Yes ?    Tobacco smoker: No ?    Systolic Blood Pressure: 575 mmHg ?    Is BP treated: Yes ?    HDL Cholesterol: 34.6 mg/dL ?    Total Cholesterol: 162 mg/dL   ? ? ?  07/03/2020  ? 10:06 AM 06/21/2019  ?  8:50 AM 05/15/2019  ?  3:41 PM  ?Depression screen PHQ 2/9  ?Decreased Interest 0 0 0  ?Down, Depressed, Hopeless 0 0 0  ?PHQ - 2 Score 0 0 0  ?Altered sleeping 0  0  ?Tired, decreased energy 0  0  ?Change in appetite 0  0  ?Feeling bad or failure about yourself  0  0  ?Trouble concentrating 0  0  ?Moving slowly or fidgety/restless 0  0  ?Suicidal thoughts 0  0  ?PHQ-9 Score 0  0  ?Difficult doing work/chores Not difficult at all  Not difficult at all  ?  ? ?CHA2DS2/VAS Stroke Risk Points  Current as of yesterday  ?   4 >= 2 Points: High Risk  ?1 - 1.99 Points: Medium Risk  ?0 Points: Low Risk  ?  Last Change: [name]   ? ?  ? Points Metrics  ?1 Has Congestive Heart Failure:  Yes   ? Current as of yesterday  ?1 Has Vascular Disease:  Yes   ? Current as of yesterday  ?1 Has Hypertension:  Yes   ? Current as of yesterday  ?0 Age:  69   ? Current as of yesterday  ?1 Has Diabetes:  Yes   ? Current as of yesterday  ?0 Had Stroke:  No  Had TIA:  No  Had Thromboembolism:  No   ? Current as of yesterday  ?0 Male:  No   ? Current as of yesterday  ?  ? ?Social History  ? ?Tobacco Use  ?Smoking Status Never  ?Smokeless Tobacco Former  ? Types: Snuff  ? Quit date: 10/12/2004  ?Tobacco Comments  ?  02/14/2013 "quit snuff in 2006"  ? ?BP Readings from Last 3 Encounters:  ?03/30/21 132/74  ?02/16/21 112/70  ?01/22/21 118/80  ? ?Pulse Readings from Last 3 Encounters:  ?03/30/21 94  ?02/16/21 84  ?01/22/21 87  ? ?Wt Readings from Last 3 Encounters:  ?03/30/21 288 lb 6.4 oz  (130.8 kg)  ?02/16/21 289 lb (131.1 kg)  ?01/22/21 289 lb (131.1 kg)  ? ?BMI Readings from Last 3 Encounters:  ?03/30/21 35.11 kg/m?  ?02/16/21 35.18 kg/m?  ?01/22/21 35.18 kg/m?  ? ? ?Assessment/Interventions: Review of patient past medical history, allergies, medications, health status, including review of consultants reports, laboratory and other test data, was performed as part of comprehensive evaluation and provision of chronic care management services.  ? ?SDOH:  (Social Determinants of Health) assessments and interventions performed: No - done June 2022 AWV ? ?SDOH Screenings  ? ?Alcohol Screen: Low Risk   ? Last Alcohol Screening Score (AUDIT): 0  ?Depression (PHQ2-9): Low Risk   ? PHQ-2 Score: 0  ?Financial Resource Strain: Low Risk   ? Difficulty of Paying Living Expenses: Not hard at all  ?Food Insecurity: No Food Insecurity  ? Worried About Charity fundraiser in the Last Year: Never true  ? Ran Out of Food in the Last Year: Never true  ?Housing: Low Risk   ? Last Housing Risk Score: 0  ?Physical Activity: Insufficiently Active  ? Days of Exercise per Week: 7 days  ? Minutes of Exercise per Session: 20 min  ?Social Connections: Not on file  ?Stress: No Stress Concern Present  ? Feeling of Stress : Not at all  ?Tobacco Use: Medium Risk  ? Smoking Tobacco Use: Never  ? Smokeless Tobacco Use: Former  ? Passive Exposure: Not on file  ?Transportation Needs: No Transportation Needs  ? Lack of Transportation (Medical): No  ? Lack of Transportation (Non-Medical): No  ? ? ?CCM Care Plan ? ?Allergies  ?Allergen Reactions  ? Atorvastatin   ?  Muscle pain   ? ? ?Medications Reviewed Today   ? ? Reviewed by Charlton Haws, RPH (Pharmacist) on 05/27/21 at 1154  Med List Status: <None>  ? ?Medication Order Taking? Sig Documenting Provider Last Dose Status Informant  ?acetaminophen (TYLENOL) 325 MG tablet [id number] Yes Take 650 mg by mouth every 6 (six) hours as needed for mild pain or moderate pain. [provider] Taking Active Self  ?ALPRAZolam (XANAX) 0.25 MG tablet [id number] Yes TAKE 1 TABLET BY MOUTH DAILY AS NEEDED FOR ANXIETY [name], MD Taking Active   ?Blood Glucose Monitoring Suppl (ONE TOUCH ULT

## 2021-05-27 NOTE — Telephone Encounter (Signed)
Agreed.  Rx sent.  Thanks.   ?

## 2021-05-27 NOTE — Patient Instructions (Signed)
Visit Information ? ?Phone number for Pharmacist: [phone] ? ? Goals Addressed   ?None ?  ? ? ?Care Plan : [place]  ?Updates made by Charlton Haws, RPH since 05/27/2021 12:00 AM  ?  ? ?Problem: Hypertension, Hyperlipidemia, Diabetes, Atrial Fibrillation, and Heart Failure   ?Priority: High  ?  ? ?Goal: Disease Management   ?Start Date: 05/15/2020  ?Expected End Date: 05/28/2022  ?This Visit's Progress: Not on track  ?Recent Progress: On track  ?Priority: High  ?Note:   ?Current Barriers:  ?Unable to independently afford treatment regimen ?Unable to independently monitor therapeutic efficacy ?Suboptimal therapeutic regimen for DM, cholesterol ? ?Pharmacist Clinical Goal(s):  ?Patient will verbalize ability to afford treatment regimen ?achieve adherence to monitoring guidelines and medication adherence to achieve therapeutic efficacy ?adhere to plan to optimize therapeutic regimen for DM, cholesterol as evidenced by report of adherence to recommended medication management changes through collaboration with PharmD and provider.  ? ?Interventions: ?1:1 collaboration with [name], MD regarding development and update of comprehensive plan of care as evidenced by provider attestation and co-signature ?Inter-disciplinary care team collaboration (see longitudinal plan of care) ?Comprehensive medication review performed; medication list updated in electronic medical record ? ?Hyperlipidemia: (LDL goal < 70) ?-Not ideally controlled - LDL 100 (5/22) above goal on max-tolerated statin; pt reports mild muscle aches even with QOD dosing; Livalo is also very expensive ($100 for 30 ds) and he is interested in alternatives ?-Current treatment: ?Livalo 1 mg - 1 tab QOD - Appropriate, Query Effective ?-Medications previously tried: atorvastatin (myalgias); Livalo daily (myalgias) ?-Given DM, NICM LDL goal < 70 is recommended ?-Educated on Cholesterol goals; Benefits of statin for ASCVD risk  reduction; ?-Recommend trial of pravastatin 10 mg daily (cost savings, similar efficacy compared to Livalo) ? ?Diabetes (A1c goal <7%) ?-Not ideally controlled - A1c 7.6% (12/2020) above goal; ?-Current home glucose readings - checking qAM ?Starting 4/17: 145, 149, 160, 159, 143, 123, 160, 141, 133, 140, 160, 142, 148, 143, 148, 124 ?-Denies hypoglycemic/hyperglycemic symptoms ?-Current medications: ?Jardiance 25 mg daily -Appropriate, Query Effective ?Metformin ER 500 mg - 3 tablets AM -Appropriate, Query Effective ?Glipizide XL 10 mg daily AM -Appropriate, Query Effective ?Testing supplies ?-Medications previously tried: metformin IR (GI) ?-Current meal patterns: tries to avoid bread, chips - started snacking on unsalted peanuts instead ?-Current exercise: [employer] 2x a week; walks 30 min a few times a week ?-Educated on A1c and blood sugar goals ?-Discussed benefits of CGM - pt would like to try it; Recommend Freestyle Libre 2 ?-Recommended to continue current medication; repeat A1c at upcoming PCP appt ? ?Atrial Fibrillation (Goal: prevent stroke and major bleeding) ?-Controlled - follows with Dr Caryl Comes ?-CHADSVASC=4; S/p ablation ?-Current treatment: ?Dofetilide 500 mcg BID -Appropriate, Effective, Safe, Accessible ?Carvedilol 25 mg - 2 tab BID -Appropriate, Effective, Safe, Accessible ?Diltiazem 30 mg PRN (PVCs) -Appropriate, Effective, Safe, Accessible ?Xarelto 20 mg daily -Appropriate, Effective, Safe, Accessible ?-Medications previously tried: n/a ?-Recommended to continue current medication ? ?Heart Failure / Hypetension (Goal: BP < 130/80) ?-Controlled -Follows with Dr [name] ?-Last ejection fraction: 25-30% (Date: 2020) ?-HF type: Systolic; NYHA Class: II (slight limitation of activity); w/ ICD ?-Current home BP/HR readings: 134/79, 73; 141/77 76; 122/77, 76; 120/76, 130/85 79 ?(pt thinks his BP cuff may be too small, as office readings are usually lower) ?-He does not weigh daily. He does watch for  symptoms of fluid overload.  ?-Hx OSA, unable to tolerate CPAP. ?-Current treatment: ?Carvedilol 25  mg - 2 tablet BID - Appropriate, Effective, Safe, Accessible ?Spironolactone 25 mg daily -Appropriate, Effective, Safe, Accessible ?Entresto 97-103 mg BID -Appropriate, Effective, Safe, Accessible ?Klor Con 20 mEq ER - 2 tablets daily -Appropriate, Effective, Safe, Accessible ?Furosemide 40 mg - 1 tab Fridays -Appropriate, Effective, Safe, Accessible ?Jardiance 25 mg daily -Appropriate, Effective, Safe, Accessible ?-Medications previously tried: none  ?-Recommended to continue current medication ? ?Patient Goals/Self-Care Activities ?Patient will:  ?- take medications as prescribed as evidenced by patient report and record review ?focus on medication adherence by routine ?check glucose via CGM, document, and provide at future appointments ?check blood pressure most days, document, and provide at future appointments ?weigh daily, and contact provider if weight gain of 3+ lbs overnight,5+ lbs in a week ?collaborate with provider on medication access solutions ? ?  ?  ? ?Patient verbalizes understanding of instructions and care plan provided today and agrees to view in [place]. Active MyChart status confirmed with patient.   ?Telephone follow up appointment with pharmacy team member scheduled for: 1 week ? ?Charlene Brooke, PharmD, BCACP ?Clinical Pharmacist ?[facility] ?[phone] ?  ?

## 2021-05-31 ENCOUNTER — Other Ambulatory Visit: Payer: Self-pay | Admitting: Family Medicine

## 2021-06-01 ENCOUNTER — Ambulatory Visit: Payer: HMO | Admitting: Pharmacist

## 2021-06-01 DIAGNOSIS — E7800 Pure hypercholesterolemia, unspecified: Secondary | ICD-10-CM

## 2021-06-01 DIAGNOSIS — E119 Type 2 diabetes mellitus without complications: Secondary | ICD-10-CM

## 2021-06-01 NOTE — Progress Notes (Signed)
?Chronic Care Management  ? ?Follow Up Note ? ? ?06/01/2021 ?Name: Aaron Thompson MRN: [id number] DOB: April 17, 1960 ? ?Referred by: [name], MD ?Reason for referral : Chronic Care Management ([place] training) ? ?Summary: ?Patient presented for [facility] 2 training. ?Patient brought sensor to office. Patient opted to use reader device. We set up reader device in office. Advised patient to carry reader with him at all times and to charge it overnight. Provided overview of how to navigate the reader and use several features including adjusting alarm settings, logbook and daily patterns. ?  ?Demonstrated how to apply sensor and applied sensor to L arm today. Demonstrated how to pair sensor with [facility] Reader, sensor was successfully paired. Advised pt how to navigate Reader and scan sensor for sugar readings. Advised to scan at least every 8 hours for complete results. Advised pt to change sensor every 14 days or as directed by the Reader. Advised pt to avoid high doses of Vitamin C (>500 mg/day) and to remove sensor prior to imaging (X-ray, CT or MRI scans). Provided patient with customer service phone number in case of device malfunction: [phone]. ?  ?Pt voiced understanding of above and denies further questions. ?  ?Follow up Plan: ?-Pharmacist follow up televisit scheduled for 6 weeks ?-PCP CPE 06/25/21 (labs 06/18/21) ? ? ?Aaron Thompson is a 61 y.o. year old male who is a primary care patient of [name], MD. The CCM team was consulted for assistance with chronic disease management and care coordination needs.   ? ?Review of patient status, including review of consultants reports, relevant laboratory and other test results, and collaboration with appropriate care team members and the patient's provider was performed as part of comprehensive patient evaluation and provision of chronic care management services.   ? ?SDOH (Social Determinants of Health) assessments performed: No ?See Care  Plan activities for detailed interventions related to [facility])  ?  ? ?Outpatient Encounter Medications as of 06/01/2021  ?Medication Sig  ? acetaminophen (TYLENOL) 325 MG tablet Take 650 mg by mouth every 6 (six) hours as needed for mild pain or moderate pain.  ? ALPRAZolam (XANAX) 0.25 MG tablet TAKE 1 TABLET BY MOUTH DAILY AS NEEDED FOR ANXIETY  ? Blood Glucose Monitoring Suppl (ONE TOUCH ULTRA 2) w/Device KIT Use daily to check sugars as needed. Dx E11.9  ? carvedilol (COREG) 25 MG tablet TAKE 2 TABLETS(50 MG) BY MOUTH TWICE DAILY WITH A MEAL  ? Continuous Blood Gluc Receiver (FREESTYLE LIBRE 2 READER) DEVI Use with sensors to monitor sugar continuously  ? Continuous Blood Gluc Sensor (FREESTYLE LIBRE 2 SENSOR) MISC Apply sensor every 14 days to monitor sugar continously  ? diltiazem (CARDIZEM) 30 MG tablet Take 1 tablet as needed.  May repeat dose in 4 hours x 1 if needed.  ? dofetilide (TIKOSYN) 500 MCG capsule TAKE 1 CAPSULE(500 MCG) BY MOUTH TWICE DAILY  ? ENTRESTO 97-103 MG TAKE 1 TABLET BY MOUTH TWICE DAILY  ? glipiZIDE (GLUCOTROL XL) 10 MG 24 hr tablet TAKE 1 TABLET(10 MG) BY MOUTH DAILY WITH BREAKFAST  ? JARDIANCE 25 MG TABS tablet TAKE 1 TABLET BY MOUTH EVERY DAY  ? loratadine (CLARITIN) 10 MG tablet Take 10 mg by mouth daily as needed for allergies.  ? Magnesium Oxide 400 MG CAPS Take 1 capsule (400 mg total) by mouth daily.  ? metFORMIN (GLUCOPHAGE-XR) 500 MG 24 hr tablet TAKE 3 TABLETS(1500 MG) BY MOUTH DAILY WITH BREAKFAST  ? Multiple Vitamin (MULTIVITAMIN)  tablet Take 1 tablet by mouth daily.  ? omeprazole (PRILOSEC) 40 MG capsule Take 1 capsule (40 mg total) by mouth daily.  ? ONETOUCH ULTRA test strip CHECK GLUCOSE TWICE DAILY AND AS NEEDED  ? potassium chloride SA (KLOR-CON) 20 MEQ tablet TAKE 2 TABLETS(40 MEQ) BY MOUTH TWICE DAILY  ? pravastatin (PRAVACHOL) 10 MG tablet Take 1 tablet (10 mg total) by mouth daily.  ? spironolactone (ALDACTONE) 25 MG tablet TAKE 1 TABLET(25 MG) BY MOUTH DAILY  ?  traMADol (ULTRAM) 50 MG tablet TAKE 1 TABLET(50 MG) BY MOUTH EVERY 12 HOURS AS NEEDED FOR PAIN  ? XARELTO 20 MG TABS tablet TAKE 1 TABLET(20 MG) BY MOUTH DAILY WITH SUPPER  ? furosemide (LASIX) 40 MG tablet Take 1 tablet (40 mg total) by mouth every Friday.  ? ?No facility-administered encounter medications on file as of 06/01/2021.  ?  ? ?Objective:  ? ? Goals Addressed   ?None ?  ?  ?Patient Care Plan: [place]  ?  ? ?Problem Identified: Hypertension, Hyperlipidemia, Diabetes, Atrial Fibrillation, and Heart Failure   ?Priority: High  ?  ? ?Goal: Disease Management   ?Start Date: 05/15/2020  ?Expected End Date: 05/28/2022  ?Recent Progress: Not on track  ?Priority: High  ?Note:   ?Current Barriers:  ?Unable to independently afford treatment regimen ?Unable to independently monitor therapeutic efficacy ?Suboptimal therapeutic regimen for DM, cholesterol ? ?Pharmacist Clinical Goal(s):  ?Patient will verbalize ability to afford treatment regimen ?achieve adherence to monitoring guidelines and medication adherence to achieve therapeutic efficacy ?adhere to plan to optimize therapeutic regimen for DM, cholesterol as evidenced by report of adherence to recommended medication management changes through collaboration with PharmD and provider.  ? ?Interventions: ?1:1 collaboration with [name], MD regarding development and update of comprehensive plan of care as evidenced by provider attestation and co-signature ?Inter-disciplinary care team collaboration (see longitudinal plan of care) ?Comprehensive medication review performed; medication list updated in electronic medical record ? ?Hyperlipidemia: (LDL goal < 70) ?-Not ideally controlled - LDL 100 (5/22) above goal on max-tolerated statin; pt reports mild muscle aches even with QOD dosing; Livalo is also very expensive ($100 for 30 ds) and he is interested in alternatives ?-Current treatment: ?Livalo 1 mg - 1 tab QOD - Appropriate, Query  Effective ?-Medications previously tried: atorvastatin (myalgias); Livalo daily (myalgias) ?-Given DM, NICM LDL goal < 70 is recommended ?-Educated on Cholesterol goals; Benefits of statin for ASCVD risk reduction; ?-Recommend trial of pravastatin 10 mg daily (cost savings, similar efficacy compared to Livalo) ? ?Diabetes (A1c goal <7%) ?-Not ideally controlled - A1c 7.6% (12/2020) above goal; [employer] training completed today (see above) ?-Current home glucose readings - checking qAM ?Starting 4/17: 145, 149, 160, 159, 143, 123, 160, 141, 133, 140, 160, 142, 148, 143, 148, 124 ?-Denies hypoglycemic/hyperglycemic symptoms ?-Current medications: ?Jardiance 25 mg daily -Appropriate, Query Effective ?Metformin ER 500 mg - 3 tablets AM -Appropriate, Query Effective ?Glipizide XL 10 mg daily AM -Appropriate, Query Effective ?Freestyle Libre 2 ?-Medications previously tried: metformin IR (GI) ?-Current meal patterns: tries to avoid bread, chips - started snacking on unsalted peanuts instead ?-Current exercise: [employer] 2x a week; walks 30 min a few times a week ?-Educated on A1c and blood sugar goals ?-Recommended to continue current medication; repeat A1c at upcoming PCP appt ? ?Atrial Fibrillation (Goal: prevent stroke and major bleeding) ?-Controlled - follows with Dr Caryl Comes ?-CHADSVASC=4; S/p ablation ?-Current treatment: ?Dofetilide 500 mcg BID -Appropriate, Effective, Safe, Accessible ?  Carvedilol 25 mg - 2 tab BID -Appropriate, Effective, Safe, Accessible ?Diltiazem 30 mg PRN (PVCs) -Appropriate, Effective, Safe, Accessible ?Xarelto 20 mg daily -Appropriate, Effective, Safe, Accessible ?-Medications previously tried: n/a ?-Recommended to continue current medication ? ?Heart Failure / Hypetension (Goal: BP < 130/80) ?-Controlled -Follows with Dr [name] ?-Last ejection fraction: 25-30% (Date: 2020) ?-HF type: Systolic; NYHA Class: II (slight limitation of activity); w/ ICD ?-Current home BP/HR readings:  134/79, 73; 141/77 76; 122/77, 76; 120/76, 130/85 79 ?(pt thinks his BP cuff may be too small, as office readings are usually lower) ?-He does not weigh daily. He does watch for symptoms of fluid overload.  ?-Hx OSA, unabl

## 2021-06-01 NOTE — Patient Instructions (Signed)
Visit Information ? ?Phone number for Pharmacist: [phone] ? ? Goals Addressed   ?None ?  ? ? ?Care Plan : [place]  ?Updates made by Charlton Haws, [place] since 06/01/2021 12:00 AM  ?  ? ?Problem: Hypertension, Hyperlipidemia, Diabetes, Atrial Fibrillation, and Heart Failure   ?Priority: High  ?  ? ?Goal: Disease Management   ?Start Date: 05/15/2020  ?Expected End Date: 05/28/2022  ?Recent Progress: Not on track  ?Priority: High  ?Note:   ?Current Barriers:  ?Unable to independently afford treatment regimen ?Unable to independently monitor therapeutic efficacy ?Suboptimal therapeutic regimen for DM, cholesterol ? ?Pharmacist Clinical Goal(s):  ?Patient will verbalize ability to afford treatment regimen ?achieve adherence to monitoring guidelines and medication adherence to achieve therapeutic efficacy ?adhere to plan to optimize therapeutic regimen for DM, cholesterol as evidenced by report of adherence to recommended medication management changes through collaboration with PharmD and provider.  ? ?Interventions: ?1:1 collaboration with [name], MD regarding development and update of comprehensive plan of care as evidenced by provider attestation and co-signature ?Inter-disciplinary care team collaboration (see longitudinal plan of care) ?Comprehensive medication review performed; medication list updated in electronic medical record ? ?Hyperlipidemia: (LDL goal < 70) ?-Not ideally controlled - LDL 100 (5/22) above goal on max-tolerated statin; pt reports mild muscle aches even with QOD dosing; Livalo is also very expensive ($100 for 30 ds) and he is interested in alternatives ?-Current treatment: ?Livalo 1 mg - 1 tab QOD - Appropriate, Query Effective ?-Medications previously tried: atorvastatin (myalgias); Livalo daily (myalgias) ?-Given DM, NICM LDL goal < 70 is recommended ?-Educated on Cholesterol goals; Benefits of statin for ASCVD risk reduction; ?-Recommend trial of pravastatin 10  mg daily (cost savings, similar efficacy compared to Livalo) ? ?Diabetes (A1c goal <7%) ?-Not ideally controlled - A1c 7.6% (12/2020) above goal; [employer] training completed today (see above) ?-Current home glucose readings - checking qAM ?Starting 4/17: 145, 149, 160, 159, 143, 123, 160, 141, 133, 140, 160, 142, 148, 143, 148, 124 ?-Denies hypoglycemic/hyperglycemic symptoms ?-Current medications: ?Jardiance 25 mg daily -Appropriate, Query Effective ?Metformin ER 500 mg - 3 tablets AM -Appropriate, Query Effective ?Glipizide XL 10 mg daily AM -Appropriate, Query Effective ?Freestyle Libre 2 ?-Medications previously tried: metformin IR (GI) ?-Current meal patterns: tries to avoid bread, chips - started snacking on unsalted peanuts instead ?-Current exercise: [employer] 2x a week; walks 30 min a few times a week ?-Educated on A1c and blood sugar goals ?-Recommended to continue current medication; repeat A1c at upcoming PCP appt ? ?Atrial Fibrillation (Goal: prevent stroke and major bleeding) ?-Controlled - follows with Dr Caryl Comes ?-CHADSVASC=4; S/p ablation ?-Current treatment: ?Dofetilide 500 mcg BID -Appropriate, Effective, Safe, Accessible ?Carvedilol 25 mg - 2 tab BID -Appropriate, Effective, Safe, Accessible ?Diltiazem 30 mg PRN (PVCs) -Appropriate, Effective, Safe, Accessible ?Xarelto 20 mg daily -Appropriate, Effective, Safe, Accessible ?-Medications previously tried: n/a ?-Recommended to continue current medication ? ?Heart Failure / Hypetension (Goal: BP < 130/80) ?-Controlled -Follows with Dr [name] ?-Last ejection fraction: 25-30% (Date: 2020) ?-HF type: Systolic; NYHA Class: II (slight limitation of activity); w/ ICD ?-Current home BP/HR readings: 134/79, 73; 141/77 76; 122/77, 76; 120/76, 130/85 79 ?(pt thinks his BP cuff may be too small, as office readings are usually lower) ?-He does not weigh daily. He does watch for symptoms of fluid overload.  ?-Hx OSA, unable to tolerate CPAP. ?-Current  treatment: ?Carvedilol 25 mg - 2 tablet BID - Appropriate, Effective, Safe, Accessible ?Spironolactone 25 mg  daily -Appropriate, Effective, Safe, Accessible ?Entresto 97-103 mg BID -Appropriate, Effective, Safe, Accessible ?Klor Con 20 mEq ER - 2 tablets daily -Appropriate, Effective, Safe, Accessible ?Furosemide 40 mg - 1 tab Fridays -Appropriate, Effective, Safe, Accessible ?Jardiance 25 mg daily -Appropriate, Effective, Safe, Accessible ?-Medications previously tried: none  ?-Recommended to continue current medication ? ?Patient Goals/Self-Care Activities ?Patient will:  ?- take medications as prescribed as evidenced by patient report and record review ?focus on medication adherence by routine ?check glucose via CGM, document, and provide at future appointments ?check blood pressure most days, document, and provide at future appointments ?weigh daily, and contact provider if weight gain of 3+ lbs overnight,5+ lbs in a week ?collaborate with provider on medication access solutions ? ?  ?  ? ?Patient verbalizes understanding of instructions and care plan provided today and agrees to view in [place]. Active MyChart status confirmed with patient.   ?Telephone follow up appointment with pharmacy team member scheduled for: 6 weeks ? ?Charlene Brooke, PharmD, BCACP ?Clinical Pharmacist ?[facility] ?[phone] ?  ?

## 2021-06-01 NOTE — Telephone Encounter (Signed)
Refill request for TRAMADOL '50MG'$  TABLETS ? ?LOV - 01/22/21 ?Next OV - 06/25/21 ?Last refill - 02/20/21 #60/2 ? ?

## 2021-06-03 ENCOUNTER — Other Ambulatory Visit: Payer: Self-pay | Admitting: Family Medicine

## 2021-06-03 ENCOUNTER — Ambulatory Visit (INDEPENDENT_AMBULATORY_CARE_PROVIDER_SITE_OTHER): Payer: HMO

## 2021-06-03 DIAGNOSIS — I5022 Chronic systolic (congestive) heart failure: Secondary | ICD-10-CM

## 2021-06-03 DIAGNOSIS — Z95810 Presence of automatic (implantable) cardiac defibrillator: Secondary | ICD-10-CM | POA: Diagnosis not present

## 2021-06-03 DIAGNOSIS — Z125 Encounter for screening for malignant neoplasm of prostate: Secondary | ICD-10-CM

## 2021-06-03 DIAGNOSIS — E119 Type 2 diabetes mellitus without complications: Secondary | ICD-10-CM

## 2021-06-03 DIAGNOSIS — I10 Essential (primary) hypertension: Secondary | ICD-10-CM

## 2021-06-04 LAB — CUP PACEART REMOTE DEVICE CHECK
Battery Remaining Longevity: 59 mo
Battery Remaining Percentage: 67 %
Battery Voltage: 2.96 V
Brady Statistic AP VP Percent: 1 %
Brady Statistic AP VS Percent: 1 %
Brady Statistic AS VP Percent: 96 %
Brady Statistic AS VS Percent: 1.1 %
Brady Statistic RA Percent Paced: 1 %
Date Time Interrogation Session: 20230510040829
HighPow Impedance: 91 Ohm
HighPow Impedance: 91 Ohm
Implantable Lead Implant Date: 20150121
Implantable Lead Implant Date: 20150121
Implantable Lead Implant Date: 20150121
Implantable Lead Location: 753858
Implantable Lead Location: 753859
Implantable Lead Location: 753860
Implantable Lead Model: 7122
Implantable Pulse Generator Implant Date: 20210512
Lead Channel Impedance Value: 490 Ohm
Lead Channel Impedance Value: 610 Ohm
Lead Channel Impedance Value: 610 Ohm
Lead Channel Pacing Threshold Amplitude: 0.875 V
Lead Channel Pacing Threshold Amplitude: 1.125 V
Lead Channel Pacing Threshold Amplitude: 1.25 V
Lead Channel Pacing Threshold Pulse Width: 0.4 ms
Lead Channel Pacing Threshold Pulse Width: 0.4 ms
Lead Channel Pacing Threshold Pulse Width: 0.5 ms
Lead Channel Sensing Intrinsic Amplitude: 12 mV
Lead Channel Sensing Intrinsic Amplitude: 5 mV
Lead Channel Setting Pacing Amplitude: 1.875
Lead Channel Setting Pacing Amplitude: 2.125
Lead Channel Setting Pacing Amplitude: 2.25 V
Lead Channel Setting Pacing Pulse Width: 0.4 ms
Lead Channel Setting Pacing Pulse Width: 0.5 ms
Lead Channel Setting Sensing Sensitivity: 0.5 mV
Pulse Gen Serial Number: 9890691

## 2021-06-08 ENCOUNTER — Other Ambulatory Visit (HOSPITAL_COMMUNITY): Payer: Self-pay

## 2021-06-08 ENCOUNTER — Other Ambulatory Visit: Payer: Self-pay | Admitting: *Deleted

## 2021-06-08 DIAGNOSIS — I4891 Unspecified atrial fibrillation: Secondary | ICD-10-CM

## 2021-06-08 MED ORDER — RIVAROXABAN 20 MG PO TABS: ORAL_TABLET | ORAL | 6 refills | Status: DC

## 2021-06-08 MED ORDER — FUROSEMIDE 40 MG PO TABS: 40.0000 mg | ORAL_TABLET | ORAL | 1 refills | Status: DC

## 2021-06-08 NOTE — Telephone Encounter (Signed)
Xarelto '20mg'$  paper refill request received. Pt is 61 years old, weight-130.8kg, Crea-0.80 on 01/15/2021, last seen by Dr. Caryl Comes on 03/30/2021, Diagnosis-Afib, CrCl-183.18m/min; Dose is appropriate based on dosing criteria. Will send in refill to requested pharmacy.    ?

## 2021-06-09 ENCOUNTER — Other Ambulatory Visit: Payer: Self-pay | Admitting: Family Medicine

## 2021-06-15 NOTE — Progress Notes (Signed)
Remote ICD transmission.   

## 2021-06-18 ENCOUNTER — Other Ambulatory Visit (INDEPENDENT_AMBULATORY_CARE_PROVIDER_SITE_OTHER): Payer: HMO

## 2021-06-18 DIAGNOSIS — E119 Type 2 diabetes mellitus without complications: Secondary | ICD-10-CM

## 2021-06-18 DIAGNOSIS — Z125 Encounter for screening for malignant neoplasm of prostate: Secondary | ICD-10-CM | POA: Diagnosis not present

## 2021-06-18 DIAGNOSIS — I10 Essential (primary) hypertension: Secondary | ICD-10-CM | POA: Diagnosis not present

## 2021-06-18 LAB — CBC WITH DIFFERENTIAL/PLATELET
Basophils Absolute: 0 10*3/uL (ref 0.0–0.1)
Basophils Relative: 0.5 % (ref 0.0–3.0)
Eosinophils Absolute: 0.1 10*3/uL (ref 0.0–0.7)
Eosinophils Relative: 1.6 % (ref 0.0–5.0)
HCT: 47.4 % (ref 39.0–52.0)
Hemoglobin: 15.6 g/dL (ref 13.0–17.0)
Lymphocytes Relative: 19.3 % (ref 12.0–46.0)
Lymphs Abs: 1.4 10*3/uL (ref 0.7–4.0)
MCHC: 32.9 g/dL (ref 30.0–36.0)
MCV: 88.8 fl (ref 78.0–100.0)
Monocytes Absolute: 0.6 10*3/uL (ref 0.1–1.0)
Monocytes Relative: 8.9 % (ref 3.0–12.0)
Neutro Abs: 5 10*3/uL (ref 1.4–7.7)
Neutrophils Relative %: 69.7 % (ref 43.0–77.0)
Platelets: 155 10*3/uL (ref 150.0–400.0)
RBC: 5.34 Mil/uL (ref 4.22–5.81)
RDW: 14.7 % (ref 11.5–15.5)
WBC: 7.2 10*3/uL (ref 4.0–10.5)

## 2021-06-18 LAB — COMPREHENSIVE METABOLIC PANEL
ALT: 17 U/L (ref 0–53)
AST: 16 U/L (ref 0–37)
Albumin: 4.4 g/dL (ref 3.5–5.2)
Alkaline Phosphatase: 41 U/L (ref 39–117)
BUN: 26 mg/dL — ABNORMAL HIGH (ref 6–23)
CO2: 30 mEq/L (ref 19–32)
Calcium: 9.1 mg/dL (ref 8.4–10.5)
Chloride: 102 mEq/L (ref 96–112)
Creatinine, Ser: 0.81 mg/dL (ref 0.40–1.50)
GFR: 96.4 mL/min (ref >60.00)
Glucose, Bld: 157 mg/dL — ABNORMAL HIGH (ref 70–99)
Potassium: 4.5 mEq/L (ref 3.5–5.1)
Sodium: 140 mEq/L (ref 135–145)
Total Bilirubin: 0.7 mg/dL (ref 0.2–1.2)
Total Protein: 6.4 g/dL (ref 6.0–8.3)

## 2021-06-18 LAB — LIPID PANEL
Cholesterol: 169 mg/dL (ref 0–200)
HDL: 42.6 mg/dL (ref >39.00)
LDL Cholesterol: 103 mg/dL — ABNORMAL HIGH (ref 0–99)
NonHDL: 126.11
Total CHOL/HDL Ratio: 4
Triglycerides: 117 mg/dL (ref 0.0–149.0)
VLDL: 23.4 mg/dL (ref 0.0–40.0)

## 2021-06-18 LAB — HEMOGLOBIN A1C: Hgb A1c MFr Bld: 7.2 % — ABNORMAL HIGH (ref 4.6–6.5)

## 2021-06-18 LAB — TSH: TSH: 0.84 u[IU]/mL (ref 0.35–5.50)

## 2021-06-18 LAB — PSA, MEDICARE: PSA: 2.13 ng/ml (ref 0.10–4.00)

## 2021-06-24 DIAGNOSIS — E785 Hyperlipidemia, unspecified: Secondary | ICD-10-CM

## 2021-06-24 DIAGNOSIS — I110 Hypertensive heart disease with heart failure: Secondary | ICD-10-CM

## 2021-06-24 DIAGNOSIS — E1159 Type 2 diabetes mellitus with other circulatory complications: Secondary | ICD-10-CM | POA: Diagnosis not present

## 2021-06-24 DIAGNOSIS — Z87891 Personal history of nicotine dependence: Secondary | ICD-10-CM

## 2021-06-24 DIAGNOSIS — I4891 Unspecified atrial fibrillation: Secondary | ICD-10-CM

## 2021-06-24 DIAGNOSIS — Z7901 Long term (current) use of anticoagulants: Secondary | ICD-10-CM | POA: Diagnosis not present

## 2021-06-24 DIAGNOSIS — I5020 Unspecified systolic (congestive) heart failure: Secondary | ICD-10-CM

## 2021-06-24 DIAGNOSIS — I10 Essential (primary) hypertension: Secondary | ICD-10-CM

## 2021-06-24 DIAGNOSIS — Z7984 Long term (current) use of oral hypoglycemic drugs: Secondary | ICD-10-CM | POA: Diagnosis not present

## 2021-06-25 ENCOUNTER — Ambulatory Visit (INDEPENDENT_AMBULATORY_CARE_PROVIDER_SITE_OTHER): Payer: HMO | Admitting: Family Medicine

## 2021-06-25 ENCOUNTER — Other Ambulatory Visit: Payer: Self-pay | Admitting: Cardiovascular Disease

## 2021-06-25 ENCOUNTER — Encounter: Payer: Self-pay | Admitting: Family Medicine

## 2021-06-25 VITALS — BP 120/78 | HR 80 | Temp 97.6°F | Ht 76.0 in | Wt 280.0 lb

## 2021-06-25 DIAGNOSIS — Z0000 Encounter for general adult medical examination without abnormal findings: Secondary | ICD-10-CM

## 2021-06-25 DIAGNOSIS — Z7189 Other specified counseling: Secondary | ICD-10-CM

## 2021-06-25 DIAGNOSIS — E7800 Pure hypercholesterolemia, unspecified: Secondary | ICD-10-CM

## 2021-06-25 DIAGNOSIS — E119 Type 2 diabetes mellitus without complications: Secondary | ICD-10-CM | POA: Diagnosis not present

## 2021-06-25 DIAGNOSIS — I10 Essential (primary) hypertension: Secondary | ICD-10-CM | POA: Diagnosis not present

## 2021-06-25 DIAGNOSIS — I4891 Unspecified atrial fibrillation: Secondary | ICD-10-CM

## 2021-06-25 MED ORDER — METFORMIN HCL ER 500 MG PO TB24: ORAL_TABLET | ORAL | 3 refills | Status: DC

## 2021-06-25 NOTE — Progress Notes (Signed)
Diabetes:  Using medications without difficulties: yes, Metformin, glipizide, and jardiance.   Hypoglycemic episodes: he had some lower readings on his meter that were not verified on recheck, ie falsely low initially.  Routine cautions d/w pt.   Hyperglycemic episodes: no Feet problems: no Blood Sugars averaging: averaging 149, including some readings after meals.   eye exam within last year: done 03/2021- [facility].  See avs.   He is on low carb diet.  D/w pt.  Still exercising.    Flu 2022 Shingles d/w pt.  PNA 2013 Tetanus 2017 covid vaccine prev done.  Colon cancer screening 2018 Prostate cancer screening wnl 2023 Advance directive-wife designated if patient were incapacitated  H//o Afib.  Rare use of diltiazem.  Still anticoagulated.  Still on coreg and dofetilide at baseline.    Elevated Cholesterol: Using medications without problems: yes Muscle aches:  no Diet compliance: yes Exercise: yes Labs d/w pt.   Started on pravastatin about 2 weeks ago. Had been on livalo prev.  No aches on med. D/w pt.    Hypertension:    Using medication without problems or lightheadedness: yes Chest pain with exertion:no Edema:no Short of breath:no  PMH and SH reviewed  Meds, vitals, and allergies reviewed.   ROS: Per HPI unless specifically indicated in ROS section   GEN: nad, alert and oriented HEENT: ncat NECK: supple w/o LA CV: rrr. PULM: ctab, no inc wob ABD: soft, +bs EXT: no edema SKIN: no acute rash  Diabetic foot exam: Normal inspection No skin breakdown No calluses  Normal DP pulses Normal sensation to light touch and monofilament Nails normal

## 2021-06-25 NOTE — Patient Instructions (Signed)
On the way out , ask the front for a record release from [facility].  Take care.  Glad to see you. Plan on recheck in about 6 months.  A1c at the visit.

## 2021-06-28 NOTE — Assessment & Plan Note (Signed)
Continue carvedilol and spironolactone.  Continue Entresto.  Continue work on diet and exercise.

## 2021-06-28 NOTE — Assessment & Plan Note (Signed)
A1c reasonable at 7.2. continue metformin, glipizide, and jardiance.  Continue work on diet and exercise.  No change in medication Plan on recheck in about 6 months.  A1c at the visit.

## 2021-06-28 NOTE — Assessment & Plan Note (Signed)
Advance directive- wife designated if patient were incapacitated.  

## 2021-06-28 NOTE — Assessment & Plan Note (Signed)
Continue pravastatin.  No aches on that medication.  Routine cautions given patient.  If he does have aches he can let me know.

## 2021-06-28 NOTE — Assessment & Plan Note (Signed)
Rare use of diltiazem.  Still anticoagulated.  Still on coreg and dofetilide at baseline.  Continue as is.  Per cardiology.

## 2021-06-28 NOTE — Assessment & Plan Note (Signed)
Flu 2022 Shingles d/w pt.  PNA 2013 Tetanus 2017 covid vaccine prev done.  Colon cancer screening 2018 Prostate cancer screening wnl 2023 Advance directive-wife designated if patient were incapacitated

## 2021-07-06 ENCOUNTER — Ambulatory Visit (INDEPENDENT_AMBULATORY_CARE_PROVIDER_SITE_OTHER): Payer: HMO

## 2021-07-06 VITALS — Wt 265.0 lb

## 2021-07-06 DIAGNOSIS — Z0000 Encounter for general adult medical examination without abnormal findings: Secondary | ICD-10-CM

## 2021-07-06 NOTE — Patient Instructions (Signed)
Aaron Thompson , Thank you for taking time to come for your Medicare Wellness Visit. I appreciate your ongoing commitment to your health goals. Please review the following plan we discussed and let me know if I can assist you in the future.   Screening recommendations/referrals: Colonoscopy: 10/26/16 Recommended yearly ophthalmology/optometry visit for glaucoma screening and checkup Recommended yearly dental visit for hygiene and checkup  Vaccinations: Influenza vaccine: 01/22/21 Pneumococcal vaccine: 09/30/11 Tdap vaccine: 07/23/15 Shingles vaccine: N/D   Covid-19: 05/13/19, 06/12/19, 10/04/19  Advanced directives: NO  Conditions/risks identified: NONE  Next appointment: Follow up in one year for your annual wellness visit 07/08/22 @ 9:45AM BY PHONE  Preventive Care 40-64 Years, Male Preventive care refers to lifestyle choices and visits with your health care provider that can promote health and wellness. What does preventive care include? A yearly physical exam. This is also called an annual well check. Dental exams once or twice a year. Routine eye exams. Ask your health care provider how often you should have your eyes checked. Personal lifestyle choices, including: Daily care of your teeth and gums. Regular physical activity. Eating a healthy diet. Avoiding tobacco and drug use. Limiting alcohol use. Practicing safe sex. Taking low-dose aspirin every day starting at age 90. What happens during an annual well check? The services and screenings done by your health care provider during your annual well check will depend on your age, overall health, lifestyle risk factors, and family history of disease. Counseling  Your health care provider may ask you questions about your: Alcohol use. Tobacco use. Drug use. Emotional well-being. Home and relationship well-being. Sexual activity. Eating habits. Work and work Statistician. Screening  You may have the following tests or  measurements: Height, weight, and BMI. Blood pressure. Lipid and cholesterol levels. These may be checked every 5 years, or more frequently if you are over 45 years old. Skin check. Lung cancer screening. You may have this screening every year starting at age 65 if you have a 30-pack-year history of smoking and currently smoke or have quit within the past 15 years. Fecal occult blood test (FOBT) of the stool. You may have this test every year starting at age 66. Flexible sigmoidoscopy or colonoscopy. You may have a sigmoidoscopy every 5 years or a colonoscopy every 10 years starting at age 77. Prostate cancer screening. Recommendations will vary depending on your family history and other risks. Hepatitis C blood test. Hepatitis B blood test. Sexually transmitted disease (STD) testing. Diabetes screening. This is done by checking your blood sugar (glucose) after you have not eaten for a while (fasting). You may have this done every 1-3 years. Discuss your test results, treatment options, and if necessary, the need for more tests with your health care provider. Vaccines  Your health care provider may recommend certain vaccines, such as: Influenza vaccine. This is recommended every year. Tetanus, diphtheria, and acellular pertussis (Tdap, Td) vaccine. You may need a Td booster every 10 years. Zoster vaccine. You may need this after age 30. Pneumococcal 13-valent conjugate (PCV13) vaccine. You may need this if you have certain conditions and have not been vaccinated. Pneumococcal polysaccharide (PPSV23) vaccine. You may need one or two doses if you smoke cigarettes or if you have certain conditions. Talk to your health care provider about which screenings and vaccines you need and how often you need them. This information is not intended to replace advice given to you by your health care provider. Make sure you discuss any questions you have  with your health care provider. Document Released:  02/07/2015 Document Revised: 10/01/2015 Document Reviewed: 11/12/2014 Elsevier Interactive Patient Education  2017 [place] Prevention in the Home Falls can cause injuries. They can happen to people of all ages. There are many things you can do to make your home safe and to help prevent falls. What can I do on the outside of my home? Regularly fix the edges of walkways and driveways and fix any cracks. Remove anything that might make you trip as you walk through a door, such as a raised step or threshold. Trim any bushes or trees on the path to your home. Use bright outdoor lighting. Clear any walking paths of anything that might make someone trip, such as rocks or tools. Regularly check to see if handrails are loose or broken. Make sure that both sides of any steps have handrails. Any raised decks and porches should have guardrails on the edges. Have any leaves, snow, or ice cleared regularly. Use sand or salt on walking paths during winter. Clean up any spills in your garage right away. This includes oil or grease spills. What can I do in the bathroom? Use night lights. Install grab bars by the toilet and in the tub and shower. Do not use towel bars as grab bars. Use non-skid mats or decals in the tub or shower. If you need to sit down in the shower, use a plastic, non-slip stool. Keep the floor dry. Clean up any water that spills on the floor as soon as it happens. Remove soap buildup in the tub or shower regularly. Attach bath mats securely with double-sided non-slip rug tape. Do not have throw rugs and other things on the floor that can make you trip. What can I do in the bedroom? Use night lights. Make sure that you have a light by your bed that is easy to reach. Do not use any sheets or blankets that are too big for your bed. They should not hang down onto the floor. Have a firm chair that has side arms. You can use this for support while you get dressed. Do not have  throw rugs and other things on the floor that can make you trip. What can I do in the kitchen? Clean up any spills right away. Avoid walking on wet floors. Keep items that you use a lot in easy-to-reach places. If you need to reach something above you, use a strong step stool that has a grab bar. Keep electrical cords out of the way. Do not use floor polish or wax that makes floors slippery. If you must use wax, use non-skid floor wax. Do not have throw rugs and other things on the floor that can make you trip. What can I do with my stairs? Do not leave any items on the stairs. Make sure that there are handrails on both sides of the stairs and use them. Fix handrails that are broken or loose. Make sure that handrails are as long as the stairways. Check any carpeting to make sure that it is firmly attached to the stairs. Fix any carpet that is loose or worn. Avoid having throw rugs at the top or bottom of the stairs. If you do have throw rugs, attach them to the floor with carpet tape. Make sure that you have a light switch at the top of the stairs and the bottom of the stairs. If you do not have them, ask someone to add them for you. What  else can I do to help prevent falls? Wear shoes that: Do not have high heels. Have rubber bottoms. Are comfortable and fit you well. Are closed at the toe. Do not wear sandals. If you use a stepladder: Make sure that it is fully opened. Do not climb a closed stepladder. Make sure that both sides of the stepladder are locked into place. Ask someone to hold it for you, if possible. Clearly [place] and make sure that you can see: Any grab bars or handrails. First and last steps. Where the edge of each step is. Use tools that help you move around (mobility aids) if they are needed. These include: Canes. Walkers. Scooters. Crutches. Turn on the lights when you go into a dark area. Replace any light bulbs as soon as they burn out. Set up your furniture so  you have a clear path. Avoid moving your furniture around. If any of your floors are uneven, fix them. If there are any pets around you, be aware of where they are. Review your medicines with your doctor. Some medicines can make you feel dizzy. This can increase your chance of falling. Ask your doctor what other things that you can do to help prevent falls. This information is not intended to replace advice given to you by your health care provider. Make sure you discuss any questions you have with your health care provider. Document Released: 11/07/2008 Document Revised: 06/19/2015 Document Reviewed: 02/15/2014 Elsevier Interactive Patient Education  2017 [employer].

## 2021-07-06 NOTE — Progress Notes (Signed)
Virtual Visit via Telephone Note  I connected with  Aaron Thompson on 07/06/21 at  9:45 AM EDT by telephone and verified that I am speaking with the correct person using two identifiers.  Location: Patient: HOME Provider: [place] participating in the virtual visit: [place]   I discussed the limitations, risks, security and privacy concerns of performing an evaluation and management service by telephone and the availability of in person appointments. The patient expressed understanding and agreed to proceed.  Interactive audio and video telecommunications were attempted between this nurse and patient, however failed, due to patient having technical difficulties OR patient did not have access to video capability.  We continued and completed visit with audio only.  Some vital signs may be absent or patient reported.   Dionisio David, LPN  Subjective:   Aaron Thompson is a 61 y.o. male who presents for Medicare Annual/Subsequent preventive examination.  Review of Systems           Objective:    There were no vitals filed for this visit. There is no height or weight on file to calculate BMI.     07/03/2020   10:01 AM 06/06/2019   10:17 AM 05/15/2019    3:38 PM 10/26/2016    9:22 AM 10/21/2016    1:51 PM 10/14/2015    6:22 AM 08/13/2015   12:00 AM  Advanced Directives  Does Patient Have a Medical Advance Directive? Yes Yes Yes Yes No Yes No  Type of Paramedic of [place];Living will [place];Living will [place];Living will Living will  Living will;Healthcare Power of Attorney   Does patient want to make changes to medical advance directive?  No - Patient declined    No - Patient declined   Copy of [place] in Chart? No - copy requested  No - copy requested No - copy requested No - copy requested Yes   Would patient like information on creating a medical advance  directive?       No - patient declined information    Current Medications (verified) Outpatient Encounter Medications as of 07/06/2021  Medication Sig   acetaminophen (TYLENOL) 325 MG tablet Take 650 mg by mouth every 6 (six) hours as needed for mild pain or moderate pain.   ALPRAZolam (XANAX) 0.25 MG tablet TAKE 1 TABLET BY MOUTH DAILY AS NEEDED FOR ANXIETY   Blood Glucose Monitoring Suppl (ONE TOUCH ULTRA 2) w/Device KIT Use daily to check sugars as needed. Dx E11.9   carvedilol (COREG) 25 MG tablet TAKE 2 TABLETS(50 MG) BY MOUTH TWICE DAILY WITH A MEAL   Continuous Blood Gluc Receiver (FREESTYLE LIBRE 2 READER) DEVI Use with sensors to monitor sugar continuously   Continuous Blood Gluc Sensor (FREESTYLE LIBRE 2 SENSOR) MISC Apply sensor every 14 days to monitor sugar continously   diltiazem (CARDIZEM) 30 MG tablet Take 1 tablet as needed.  May repeat dose in 4 hours x 1 if needed.   dofetilide (TIKOSYN) 500 MCG capsule TAKE 1 CAPSULE(500 MCG) BY MOUTH TWICE DAILY   ENTRESTO 97-103 MG TAKE 1 TABLET BY MOUTH TWICE DAILY   furosemide (LASIX) 40 MG tablet Take 1 tablet (40 mg total) by mouth every Friday. Call office to schedule an appointment for further refills   glipiZIDE (GLUCOTROL XL) 10 MG 24 hr tablet TAKE 1 TABLET(10 MG) BY MOUTH DAILY WITH BREAKFAST   JARDIANCE 25 MG TABS tablet TAKE 1  TABLET BY MOUTH EVERY DAY   loratadine (CLARITIN) 10 MG tablet Take 10 mg by mouth daily as needed for allergies.   Magnesium Oxide 400 MG CAPS Take 1 capsule (400 mg total) by mouth daily.   metFORMIN (GLUCOPHAGE-XR) 500 MG 24 hr tablet TAKE 3 TABLETS(1500 MG) BY MOUTH DAILY WITH BREAKFAST   Multiple Vitamin (MULTIVITAMIN) tablet Take 1 tablet by mouth daily.   omeprazole (PRILOSEC) 40 MG capsule Take 1 capsule (40 mg total) by mouth daily.   ONETOUCH ULTRA test strip CHECK GLUCOSE TWICE DAILY AND AS NEEDED   potassium chloride SA (KLOR-CON) 20 MEQ tablet TAKE 2 TABLETS(40 MEQ) BY MOUTH TWICE DAILY    pravastatin (PRAVACHOL) 10 MG tablet TAKE 1 TABLET(10 MG) BY MOUTH DAILY   rivaroxaban (XARELTO) 20 MG TABS tablet TAKE 1 TABLET(20 MG) BY MOUTH DAILY WITH SUPPER   spironolactone (ALDACTONE) 25 MG tablet TAKE 1 TABLET(25 MG) BY MOUTH DAILY   traMADol (ULTRAM) 50 MG tablet TAKE 1 TABLET(50 MG) BY MOUTH EVERY 12 HOURS AS NEEDED FOR PAIN   No facility-administered encounter medications on file as of 07/06/2021.    Allergies (verified) Atorvastatin   History: Past Medical History:  Diagnosis Date   AICD (automatic cardioverter/defibrillator) present    Anxiety state, unspecified    Calculus of kidney 1981   "passed it on my own"   Chronic systolic CHF (congestive heart failure) ([place])    Dermatophytosis of the body    DJD (degenerative joint disease)    Esophageal reflux    Hx of colonic polyps    Hypertension    LBBB (left bundle branch block)    intermittent   Non-ischemic cardiomyopathy ([place])    a. cath 2013: minor nonobstructive CAD.   OSA (obstructive sleep apnea)    he can't tolerate CPAP.    Osteoarthrosis, unspecified whether generalized or localized, hand    Other chronic nonalcoholic liver disease    fatty liver   PAF (paroxysmal atrial fibrillation) ([place])    a. s/p DCCV 6/11; previously on Pradaxa;  b. Event Monitor 2012->No PAF;  c. 09/2011 s/p DCCV ->Xarelto started. d. 03/2014: inappropriate ICD shocks for AF-RVR, started on Tikosyn, Xarelto restarted.   Type II diabetes mellitus ([place])    Unspecified hearing loss    no hearing aid   Past Surgical History:  Procedure Laterality Date   Abdominal US  01/2007   Fatty liver, no gallstones   APPENDECTOMY     ATRIAL TACH ABLATION  09/2015   BI-VENTRICULAR IMPLANTABLE CARDIOVERTER DEFIBRILLATOR [name] 02/14/2013   STJ CRTD implanted by Dr Caryl Comes   BILATERAL KNEE ARTHROSCOPY     BIV ICD GENERATOR CHANGEOUT [name] 06/06/2019   Procedure: BIV ICD GENERATOR CHANGEOUT;  Surgeon: Deboraha Sprang, MD;  Location: [facility];   Service: Cardiovascular;  Laterality: [name];   CARDIOVERSION  09/29/2011   Procedure: CARDIOVERSION;  Surgeon: Thayer Headings, MD;  Location: [facility];  Service: Cardiovascular;  Laterality: [name];   COLONOSCOPY WITH PROPOFOL [name] 10/26/2016   Procedure: COLONOSCOPY WITH PROPOFOL;  Surgeon: Irene Shipper, MD;  Location: [facility];  Service: Endoscopy;  Laterality: [name];   DOPPLER ECHOCARDIOGRAPHY  11/2009   Decreased EF of 35-40%   ELECTROPHYSIOLOGIC STUDY [name] 10/14/2015   Procedure: Atrial Fibrillation Ablation;  Surgeon: [name], MD;  Location: [facility];  Service: Cardiovascular;  Laterality: [name];   LEFT HEART CATHETERIZATION WITH CORONARY ANGIOGRAM [name] 08/18/2011   Procedure: LEFT HEART CATHETERIZATION WITH CORONARY ANGIOGRAM;  Surgeon:  Peter M Martinique, MD;  Location: [facility];  Service: Cardiovascular;  Laterality: [name];   NECK SURGERY     plate/fusion   Stress Cardiolite  08/2005   Normal, EF 42%   UPPER GASTROINTESTINAL ENDOSCOPY  08/2006   GERD   Family History  Problem Relation Age of Onset   Hypertension Mother    Diabetes Mother    Heart failure Mother        Died @ 40   Hypertension Father    Heart failure Father        Died @ 29   Prostate cancer Father    Other Sister    Multiple myeloma Sister    Leukemia Sister    Colon polyps Sister    Diabetes Brother    Kidney disease Brother    Diabetes Brother    Prostate cancer Brother    Diabetes Brother    Cirrhosis Maternal Uncle        alcohol related   Stroke Paternal Grandmother    Colon cancer Paternal Grandmother    Heart attack Paternal Grandfather    Esophageal cancer Neg Hx    Stomach cancer Neg Hx    Pancreatic cancer Neg Hx    Social History   Socioeconomic History   Marital status: Married    Spouse name: Not on file   Thompson of children: 2   Years of education: Not on file   Highest education level: Not on file  Occupational History   Occupation: Banker: UNEMPLOYED   Tobacco Use   Smoking status: Never   Smokeless tobacco: Former    Types: Snuff    Quit date: 10/12/2004   Tobacco comments:    02/14/2013 "quit snuff in 2006"  Vaping Use   Vaping Use: Never used  Substance and Sexual Activity   Alcohol use: No    Alcohol/week: 0.0 standard drinks of alcohol   Drug use: No   Sexual activity: Yes  Other Topics Concern   Not on file  Social History Narrative   Lives in [place] with wife.  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   Married 1990   Social Determinants of Health   Financial Resource Strain: Low Risk  (07/03/2020)   Overall Financial Resource Strain (CARDIA)    Difficulty of Paying Living Expenses: Not hard at all  Recent Concern: Financial Resource Strain - High Risk (05/16/2020)   Overall Financial Resource Strain (CARDIA)    Difficulty of Paying Living Expenses: Hard  Food Insecurity: No Food Insecurity (07/03/2020)   Hunger Vital Sign    Worried About Running Out of Food in the Last Year: Never true    Ran Out of Food in the Last Year: Never true  Transportation Needs: No Transportation Needs (07/03/2020)   PRAPARE - Hydrologist (Medical): No    Lack of Transportation (Non-Medical): No  Physical Activity: Sufficiently Active (07/06/2021)   Exercise Vital Sign    Days of Exercise per Week: 7 days    Minutes of Exercise per Session: 30 min  Stress: No Stress Concern Present (07/06/2021)   [place]    Feeling of Stress : Not at all  Social Connections: Not on file    Tobacco Counseling Counseling given: Not Answered Tobacco comments: 02/14/2013 "quit snuff in 2006"   Clinical Intake:  Pre-visit preparation completed: Yes  Pain : No/denies pain     Nutritional  Risks: None Diabetes: Yes CBG done?: No Did pt. bring in CBG monitor from home?: No  How often do you need to have someone help you when you read  instructions, pamphlets, or other written materials from your doctor or pharmacy?: 1 - Never  Diabetic?Nutrition Risk Assessment:  Has the patient had any N/V/D within the last 2 months?  No  Does the patient have any non-healing wounds?  No  Has the patient had any unintentional weight loss or weight gain?  No   Diabetes:  Is the patient diabetic?  Yes  If diabetic, was a CBG obtained today?  No  Did the patient bring in their glucometer from home?  No  How often do you monitor your CBG's? THREE TIMES/DAY.   Financial Strains and Diabetes Management:  Are you having any financial strains with the device, your supplies or your medication? No .  Does the patient want to be seen by Chronic Care Management for management of their diabetes?  No  Would the patient like to be referred to a Nutritionist or for Diabetic Management?  No   Diabetic Exams:  Diabetic Eye Exam: Completed 04/07/20. Overdue for diabetic eye exam. Pt has been advised about the importance in completing this exam.   Diabetic Foot Exam: Completed 06/25/21. Pt has been advised about the importance in completing this exam.   Interpreter Needed?: No  Information entered by :: Kirke Shaggy, LPN   Activities of Daily Living     No data to display          Patient Care Team: [name], MD as PCP - General (Family Medicine) [name], MD as PCP - Cardiology (Cardiology) Deboraha Sprang, MD as PCP - Electrophysiology (Cardiology) Charlton Haws, [facility] as Pharmacist (Pharmacist)  Indicate any recent Medical Services you may have received from other than [facility] providers in the past year (date may be approximate).     Assessment:   This is a routine wellness examination for [employer].  Hearing/Vision screen No results found.  Dietary issues and exercise activities discussed:     Goals Addressed             This Visit's Progress    DIET - EAT MORE FRUITS AND VEGETABLES          Depression Screen    07/06/2021    9:55 AM 07/03/2020   10:06 AM 06/21/2019    8:50 AM 05/15/2019    3:41 PM 12/01/2018    8:50 AM 07/05/2017    8:37 AM  PHQ 2/9 Scores  PHQ - 2 Score 0 0 0 0 0 0  PHQ- 9 Score 0 0  0 0     Fall Risk    07/03/2020   10:03 AM 05/15/2019    3:39 PM  [place] in the past year? 0 1  Comment  tripped on step  Thompson falls in past yr: 0 0  Injury with Fall? 0 0  Risk for fall due to : Medication side effect Medication side effect  Follow up Falls evaluation completed;Falls prevention discussed Falls evaluation completed;Falls prevention discussed    FALL RISK PREVENTION PERTAINING TO THE HOME:  Any stairs in or around the home? No  If so, are there any without handrails? No  Home free of loose throw rugs in walkways, pet beds, electrical cords, etc? Yes  Adequate lighting in your home to reduce risk of falls? Yes   ASSISTIVE DEVICES UTILIZED  TO PREVENT FALLS:  Life alert? No  Use of a cane, walker or w/c? No  Grab bars in the bathroom? No  Shower chair or bench in shower? No  Elevated toilet seat or a handicapped toilet? Yes    Cognitive Function:DECLINED TO TEST      07/03/2020   10:09 AM 05/15/2019    3:45 PM  MMSE - Mini Mental State Exam  Not completed: Refused   Orientation to time  5  Orientation to Place  5  Registration  3  Attention/ Calculation  5  Recall  3  Language- repeat  1        Immunizations Immunization History  Administered Date(s) Administered   Influenza, Seasonal, Injecte, Preservative Fre 12/03/2013   Influenza,inj,Quad PF,6+ Mos 10/05/2016, 11/04/2017, 12/01/2018, 01/22/2021   Influenza-Unspecified 12/09/2012, 11/24/2014, 11/20/2019   Moderna Sars-Covid-2 Vaccination 05/13/2019, 06/12/2019, 10/04/2019   Pneumococcal Polysaccharide-23 09/30/2011   Td 07/23/2015    TDAP status: Up to date  Flu Vaccine status: Up to date  Pneumococcal vaccine status: Due, Education has been provided regarding  the importance of this vaccine. Advised may receive this vaccine at local pharmacy or Health Dept. Aware to provide a copy of the vaccination record if obtained from local pharmacy or Health Dept. Verbalized acceptance and understanding.  Covid-19 vaccine status: Completed vaccines  Qualifies for Shingles Vaccine? Yes   Zostavax completed No   Shingrix Completed?: No.    Education has been provided regarding the importance of this vaccine. Patient has been advised to call insurance company to determine out of pocket expense if they have not yet received this vaccine. Advised may also receive vaccine at local pharmacy or Health Dept. Verbalized acceptance and understanding.  Screening Tests Health Maintenance  Topic Date Due   Zoster Vaccines- Shingrix (1 of 2) Never done   COVID-19 Vaccine (4 - Booster for Moderna series) 11/29/2019   OPHTHALMOLOGY EXAM  04/07/2021   INFLUENZA VACCINE  08/25/2021   HEMOGLOBIN A1C  12/19/2021   FOOT EXAM  06/26/2022   TETANUS/TDAP  07/22/2025   COLONOSCOPY (Pts 45-88yr Insurance coverage will need to be confirmed)  10/27/2026   Hepatitis C Screening  Completed   HIV Screening  Completed   HPV VACCINES  Aged Out    Health Maintenance  Health Maintenance Due  Topic Date Due   Zoster Vaccines- Shingrix (1 of 2) Never done   COVID-19 Vaccine (4 - Booster for Moderna series) 11/29/2019   OPHTHALMOLOGY EXAM  04/07/2021    Colorectal cancer screening: Type of screening: Colonoscopy. Completed 10/26/16. Repeat every 10 years  Lung Cancer Screening: (Low Dose CT Chest recommended if Age 141-80years, 30 pack-year currently smoking OR have quit w/in 15years.) does not qualify.   Additional Screening:  Hepatitis C Screening: does qualify; Completed 06/23/16  Vision Screening: Recommended annual ophthalmology exams for early detection of glaucoma and other disorders of the eye. Is the patient up to date with their annual eye exam?  Yes  Who is the  provider or what is the name of the office in which the patient attends annual eye exams? [facility] If pt is not established with a provider, would they like to be referred to a provider to establish care? No .   Dental Screening: Recommended annual dental exams for proper oral hygiene  Community Resource Referral / Chronic Care Management: CRR required this visit?  No   CCM required this visit?  No      Plan:  I have personally reviewed and noted the following in the patient's chart:   Medical and social history Use of alcohol, tobacco or illicit drugs  Current medications and supplements including opioid prescriptions. Patient is not currently taking opioid prescriptions. Functional ability and status Nutritional status Physical activity Advanced directives List of other physicians Hospitalizations, surgeries, and ER visits in previous 12 months Vitals Screenings to include cognitive, depression, and falls Referrals and appointments  In addition, I have reviewed and discussed with patient certain preventive protocols, quality metrics, and best practice recommendations. A written personalized care plan for preventive services as well as general preventive health recommendations were provided to patient.     Dionisio David, LPN   [id number]   Nurse Notes: Marlynn Perking

## 2021-07-09 ENCOUNTER — Encounter: Payer: Self-pay | Admitting: Dermatology

## 2021-07-09 ENCOUNTER — Ambulatory Visit (INDEPENDENT_AMBULATORY_CARE_PROVIDER_SITE_OTHER): Payer: HMO | Admitting: Dermatology

## 2021-07-09 DIAGNOSIS — L920 Granuloma annulare: Secondary | ICD-10-CM

## 2021-07-09 MED ORDER — CLOBETASOL PROP EMOLLIENT BASE 0.05 % EX CREA: 1.0000 | TOPICAL_CREAM | Freq: Two times a day (BID) | CUTANEOUS | 0 refills | Status: DC

## 2021-07-11 ENCOUNTER — Other Ambulatory Visit: Payer: Self-pay | Admitting: Cardiovascular Disease

## 2021-07-17 ENCOUNTER — Telehealth: Payer: Self-pay

## 2021-07-21 DIAGNOSIS — I447 Left bundle-branch block, unspecified: Secondary | ICD-10-CM | POA: Insufficient documentation

## 2021-07-22 ENCOUNTER — Encounter: Payer: Self-pay | Admitting: Internal Medicine

## 2021-07-22 ENCOUNTER — Ambulatory Visit: Payer: HMO | Admitting: Pharmacist

## 2021-07-22 ENCOUNTER — Ambulatory Visit (INDEPENDENT_AMBULATORY_CARE_PROVIDER_SITE_OTHER): Payer: HMO | Admitting: Internal Medicine

## 2021-07-22 VITALS — BP 110/78 | HR 78 | Ht 76.0 in | Wt 282.2 lb

## 2021-07-22 DIAGNOSIS — I428 Other cardiomyopathies: Secondary | ICD-10-CM

## 2021-07-22 DIAGNOSIS — I5022 Chronic systolic (congestive) heart failure: Secondary | ICD-10-CM

## 2021-07-22 DIAGNOSIS — Z95810 Presence of automatic (implantable) cardiac defibrillator: Secondary | ICD-10-CM

## 2021-07-22 DIAGNOSIS — I4891 Unspecified atrial fibrillation: Secondary | ICD-10-CM

## 2021-07-22 DIAGNOSIS — I10 Essential (primary) hypertension: Secondary | ICD-10-CM

## 2021-07-22 DIAGNOSIS — E7800 Pure hypercholesterolemia, unspecified: Secondary | ICD-10-CM

## 2021-07-22 DIAGNOSIS — E119 Type 2 diabetes mellitus without complications: Secondary | ICD-10-CM

## 2021-07-22 DIAGNOSIS — I447 Left bundle-branch block, unspecified: Secondary | ICD-10-CM

## 2021-07-22 NOTE — Patient Instructions (Signed)
Visit Information  Phone number for Pharmacist: [phone]   Goals Addressed   None     Care Plan : [place]  Updates made by Charlton Haws, RPH since 07/22/2021 12:00 AM     Problem: Hypertension, Hyperlipidemia, Diabetes, Atrial Fibrillation, and Heart Failure   Priority: High     Goal: Disease Management   Start Date: 05/15/2020  Expected End Date: 05/28/2022  This Visit's Progress: On track  Recent Progress: Not on track  Priority: High  Note:   Current Barriers:  None identified  Pharmacist Clinical Goal(s):  Patient will contact provider office for questions/concerns as evidenced notation of same in electronic health record through collaboration with PharmD and provider.   Interventions: 1:1 collaboration with [name], MD regarding development and update of comprehensive plan of care as evidenced by provider attestation and co-signature Inter-disciplinary care team collaboration (see longitudinal plan of care) Comprehensive medication review performed; medication list updated in electronic medical record  Hyperlipidemia: (LDL goal < 70) -Not ideally controlled - LDL 103 (06/18/21) above goal, recently switched to pravastatin 05/27/21 so labwork represents about 3 weeks on pravastatin -Current treatment: Pravastatin 10 mg daily- Appropriate, Query Effective -Medications previously tried: atorvastatin (myalgias); Livalo daily (myalgias) -Given DM, NICM LDL goal < 70 is recommended -Educated on Cholesterol goals; Benefits of statin for ASCVD risk reduction; -Recommend to continue current medication  Diabetes (A1c goal <7%) -Not ideally controlled - A1c 7.2% (05/2021) above goal but improving; pt recently started using [facility] and is concerned that readings are different than BG (finger sticks); reviewed differences between sensor glucose and blood glucose -Current home glucose readings - Freestyle Libre 2 (w/ reader)  Avg: 151; range  137-166 -Denies hypoglycemic/hyperglycemic symptoms -Current medications: Jardiance 25 mg daily -Appropriate, Query Effective Metformin ER 500 mg - 3 tablets AM -Appropriate, Query Effective Glipizide XL 10 mg daily AM -Appropriate, Query Effective Freestyle Libre 2 -Medications previously tried: metformin IR (GI) -Current meal patterns: tries to avoid bread, chips - started snacking on unsalted peanuts instead -Current exercise: [employer] 2x a week; walks 30 min a few times a week -Educated on A1c and blood sugar goals -Recommended to continue current medication  Atrial Fibrillation (Goal: prevent stroke and major bleeding) -Controlled - follows with Dr Caryl Comes -CHADSVASC=4; S/p ablation -Current treatment: Dofetilide 500 mcg BID -Appropriate, Effective, Safe, Accessible Carvedilol 25 mg - 2 tab BID -Appropriate, Effective, Safe, Accessible Diltiazem 30 mg PRN (PVCs) -Appropriate, Effective, Safe, Accessible Xarelto 20 mg daily -Appropriate, Effective, Safe, Accessible -Medications previously tried: n/a -Recommended to continue current medication  Heart Failure / Hypetension (Goal: BP < 130/80) -Controlled -Follows with Dr [name] -Last ejection fraction: 25-30% (Date: 2020) -HF type: Systolic; NYHA Class: II (slight limitation of activity); w/ ICD -Current home BP/HR readings: n/a -He does not weigh daily. He does watch for symptoms of fluid overload.  -Hx OSA, unable to tolerate CPAP. -Current treatment: Carvedilol 25 mg - 2 tablet BID - Appropriate, Effective, Safe, Accessible Spironolactone 25 mg daily -Appropriate, Effective, Safe, Accessible Entresto 97-103 mg BID -Appropriate, Effective, Safe, Accessible Klor Con 20 mEq ER - 2 tablets daily -Appropriate, Effective, Safe, Accessible Furosemide 40 mg - 1 tab Fridays -Appropriate, Effective, Safe, Accessible Jardiance 25 mg daily -Appropriate, Effective, Safe, Accessible -Medications previously tried: none  -Recommended  to continue current medication  Patient Goals/Self-Care Activities Patient will:  - take medications as prescribed as evidenced by patient report and record review -focus on medication adherence by routine -  check glucose via CGM, document, and provide at future appointments -check blood pressure most days, document, and provide at future appointments -weigh daily, and contact provider if weight gain of 3+ lbs overnight,5+ lbs in a week -collaborate with provider on medication access solutions       Patient verbalizes understanding of instructions and care plan provided today and agrees to view in [place]. Active MyChart status and patient understanding of how to access instructions and care plan via MyChart confirmed with patient.    Telephone follow up appointment with pharmacy team member scheduled for: 7 months  Charlene Brooke, PharmD, [facility] Clinical Pharmacist [facility] [phone]

## 2021-07-22 NOTE — Progress Notes (Signed)
Patient Care Team: [name], MD as PCP - General (Family Medicine) [name], MD as PCP - Cardiology (Cardiology) Deboraha Sprang, MD as PCP - Electrophysiology (Cardiology) Charlton Haws, [facility] as Pharmacist (Pharmacist) All  HPI  Aaron Thompson is a 61 y.o. male Seen in followup for CRT- ICD implanted for nonischemic cardiomyopathy and left bundle branch block.  He was noted on  his device to have atrial fibrillation and he was started on Rivaroxaban for stroke reduction prophylaxis   Chose to stop his anticoagulation and to return to work. March 2016 he had ICD shocks associated with very rapid atrial fibrillation.  Rx w dofetilide therapy  And Rivaroxaban; underwent catheter ablation of his atrial fibrillation (JA) 9/17  Complains of palpitations which have been variable and have been identified with PVC, at their most frequent based on device estimates about 3%.  Magnesium was added to his dofetilide, and the most recently as needed diltiazem    The patient denies chest pain, shortness of breath, nocturnal dyspnea, orthopnea or peripheral edema.  There have been no lightheadedness or syncope.  Complains of intermittently rapid heart rates.  Not associated with symptoms.  Noted on treadmill at the gym. He has been having symptomatic PVCs did not improve significantly with as needed diltiazem.  Not too discombobulated.  Unfortunately his wife has intercurrently been diagnosed with heart failure as well.  His/24 point DATE TEST  EF    9/17 Echo  40 %    11/18  Echo   20-25%      1/20   Echo  25-30%    3/22 Echo  25-30%         Date Cr K Hgb Mg  3/21 0.69 4.7 15.7    5/21 0.74 4.7 15.9   6/22 0.66 4.3  2.5  12/22 0.80 4.7    5/23 0.81 4.5 15.6       Past Medical History:  Diagnosis Date   AICD (automatic cardioverter/defibrillator) present    Anxiety state, unspecified    Calculus of kidney 1981   "passed it on my own"   Chronic systolic  CHF (congestive heart failure) ([place])    Dermatophytosis of the body    DJD (degenerative joint disease)    Esophageal reflux    Hx of colonic polyps    Hypertension    LBBB (left bundle branch block)    intermittent   Non-ischemic cardiomyopathy ([place])    a. cath 2013: minor nonobstructive CAD.   OSA (obstructive sleep apnea)    he can't tolerate CPAP.    Osteoarthrosis, unspecified whether generalized or localized, hand    Other chronic nonalcoholic liver disease    fatty liver   PAF (paroxysmal atrial fibrillation) ([place])    a. s/p DCCV 6/11; previously on Pradaxa;  b. Event Monitor 2012->No PAF;  c. 09/2011 s/p DCCV ->Xarelto started. d. 03/2014: inappropriate ICD shocks for AF-RVR, started on Tikosyn, Xarelto restarted.   Type II diabetes mellitus ([place])    Unspecified hearing loss    no hearing aid    Past Surgical History:  Procedure Laterality Date   Abdominal US  01/2007   Fatty liver, no gallstones   APPENDECTOMY     ATRIAL TACH ABLATION  09/2015   BI-VENTRICULAR IMPLANTABLE CARDIOVERTER DEFIBRILLATOR [name] 02/14/2013   STJ CRTD implanted by Dr Caryl Comes   BILATERAL KNEE ARTHROSCOPY     [place] [name] 06/06/2019   Procedure:  BIV ICD GENERATOR CHANGEOUT;  Surgeon: Deboraha Sprang, MD;  Location: [place] [facility];  Service: Cardiovascular;  Laterality: [name];   CARDIOVERSION  09/29/2011   Procedure: CARDIOVERSION;  Surgeon: Thayer Headings, MD;  Location: [place];  Service: Cardiovascular;  Laterality: [name];   COLONOSCOPY WITH PROPOFOL [name] 10/26/2016   Procedure: COLONOSCOPY WITH PROPOFOL;  Surgeon: Irene Shipper, MD;  Location: [facility];  Service: Endoscopy;  Laterality: [name];   DOPPLER ECHOCARDIOGRAPHY  11/2009   Decreased EF of 35-40%   ELECTROPHYSIOLOGIC STUDY [name] 10/14/2015   Procedure: Atrial Fibrillation Ablation;  Surgeon: [name], MD;  Location: [place] [facility];  Service: Cardiovascular;  Laterality: [name];   LEFT HEART CATHETERIZATION WITH CORONARY  ANGIOGRAM [name] 08/18/2011   Procedure: LEFT HEART CATHETERIZATION WITH CORONARY ANGIOGRAM;  Surgeon: Peter M Martinique, MD;  Location[facility];  Service: Cardiovascular;  Laterality: [name];   NECK SURGERY     plate/fusion   Stress Cardiolite  08/2005   Normal, EF 42%   UPPER GASTROINTESTINAL ENDOSCOPY  08/2006   GERD    Current Outpatient Medications  Medication Sig Dispense Refill   acetaminophen (TYLENOL) 325 MG tablet Take 650 mg by mouth every 6 (six) hours as needed for mild pain or moderate pain.     ALPRAZolam (XANAX) 0.25 MG tablet TAKE 1 TABLET BY MOUTH DAILY AS NEEDED FOR ANXIETY 30 tablet 1   Blood Glucose Monitoring Suppl (ONE TOUCH ULTRA 2) w/Device KIT Use daily to check sugars as needed. Dx E11.9 1 kit 0   carvedilol (COREG) 25 MG tablet TAKE 2 TABLETS(50 MG) BY MOUTH TWICE DAILY WITH A MEAL 360 tablet 3   Clobetasol Prop Emollient Base (CLOBETASOL PROPIONATE E) 0.05 % emollient cream Apply 1 Application topically 2 (two) times daily. 30 g 0   Continuous Blood Gluc Receiver (FREESTYLE LIBRE 2 READER) DEVI Use with sensors to monitor sugar continuously 1 each 0   Continuous Blood Gluc Sensor (FREESTYLE LIBRE 2 SENSOR) MISC Apply sensor every 14 days to monitor sugar continously 2 each 5   diltiazem (CARDIZEM) 30 MG tablet Take 1 tablet as needed.  May repeat dose in 4 hours x 1 if needed. 30 tablet 0   dofetilide (TIKOSYN) 500 MCG capsule TAKE 1 CAPSULE(500 MCG) BY MOUTH TWICE DAILY 180 capsule 2   ENTRESTO 97-103 MG TAKE 1 TABLET BY MOUTH TWICE DAILY 180 tablet 2   furosemide (LASIX) 40 MG tablet Take 1 tablet (40 mg total) by mouth every Friday. Call office to schedule an appointment for further refills 5 tablet 1   glipiZIDE (GLUCOTROL XL) 10 MG 24 hr tablet TAKE 1 TABLET(10 MG) BY MOUTH DAILY WITH BREAKFAST 30 tablet 12   JARDIANCE 25 MG TABS tablet TAKE 1 TABLET BY MOUTH EVERY DAY 90 tablet 1   loratadine (CLARITIN) 10 MG tablet Take 10 mg by mouth daily as needed for  allergies.     Magnesium Oxide 400 MG CAPS Take 1 capsule (400 mg total) by mouth daily. 90 capsule 3   metFORMIN (GLUCOPHAGE-XR) 500 MG 24 hr tablet TAKE 3 TABLETS(1500 MG) BY MOUTH DAILY WITH BREAKFAST 270 tablet 3   Multiple Vitamin (MULTIVITAMIN) tablet Take 1 tablet by mouth daily.     omeprazole (PRILOSEC) 40 MG capsule Take 1 capsule (40 mg total) by mouth daily. 90 capsule 3   ONETOUCH ULTRA test strip CHECK GLUCOSE TWICE DAILY AND AS NEEDED 100 strip 1   potassium chloride SA (KLOR-CON) 20 MEQ tablet TAKE  2 TABLETS(40 MEQ) BY MOUTH TWICE DAILY 360 tablet 3   pravastatin (PRAVACHOL) 10 MG tablet TAKE 1 TABLET(10 MG) BY MOUTH DAILY 90 tablet 3   rivaroxaban (XARELTO) 20 MG TABS tablet TAKE 1 TABLET(20 MG) BY MOUTH DAILY WITH SUPPER 30 tablet 6   spironolactone (ALDACTONE) 25 MG tablet TAKE 1 TABLET(25 MG) BY MOUTH DAILY 90 tablet 1   traMADol (ULTRAM) 50 MG tablet TAKE 1 TABLET(50 MG) BY MOUTH EVERY 12 HOURS AS NEEDED FOR PAIN 60 tablet 2   No current facility-administered medications for this visit.    Allergies  Allergen Reactions   Atorvastatin     Muscle pain     Review of Systems negative except from HPI and PMH  Physical Exam  BP 110/78   Pulse 78   Ht '6\' 4"'  (1.93 m)   Wt 282 lb 3.2 oz (128 kg)   SpO2 94%   BMI 34.35 kg/m  Well developed and Morbidly obese  in no acute distress HENT normal Neck supple with JVP-flat Clear Device pocket well healed; without hematoma or erythema.  There is no tethering  Regular rate and rhythm, no  murmur Abd-soft with active BS No Clubbing cyanosis  edema Skin-warm and dry A & Oriented  Grossly normal sensory and motor function  ECG sinus with P synchronous pacing at 78 Intervals 13/14/47 Negative QRS lead I rS lead V1   Assessment and Plan:  Nonischemic cardiomyopathy  Atrial tachycardia  Congestive heart failure- chronic-systolic  PVC RBBB inferior axis   Hypertension  Implantable defibrillator-CRT   Sleep  disorder breathing  Atrial fibrillation s/p PVI-JA 2017  Inappropriate ICD discharges  No interval atrial fibrillation.  Continue dofetilide 500 mcg.  Surveillance laboratories normal 5/23 QTc I think is better than the 538 reported.  Blood pressure is well controlled we will continue him on carvedilol 50 twice daily, Entresto 97/103 Aldactone 25 and the [place].  Episodic atrial tachycardia relatively brief duration.  For right now we will continue with current medications.  No great drug options.  We could try a different beta-blocker in conjunction with his carvedilol if it persists.  He also might be a candidate for repeat catheter ablation.  PVCs are still an issue we will continue him on carvedilol and dofetilide and magnesium.

## 2021-07-22 NOTE — Progress Notes (Signed)
Chronic Care Management Pharmacy Note  07/22/2021 Name:  Aaron Thompson:  [id number] DOB:  1961/01/08  Summary: CCM F/U visit -Reviewed medications; pt affirms compliance as prescribed -DM: A1c 7.2% (05/2021) improved from 7.6%; avg glucose per CGM is 151. Pt reports CGM glucose is different from BG (finger sticks) - reviewed they will usually be different as they are measuring different things (interstitial space vs blood) -HLD: LDL 103 (05/2021); pt reports tolerating pravastatin well so far  Recommendations/Changes made from today's visit: -No med changes  Plan: -[place] will call patient 3 months for DM update -Pharmacist follow up televisit scheduled for 7 months -PCP f/u 12/25/21    Subjective: Aaron Thompson is an 61 y.o. year old male who is a primary patient of [name], [name], MD.  The CCM team was consulted for assistance with disease management and care coordination needs.    Engaged with patient by telephone for follow up visit in response to provider referral for pharmacy case management and/or care coordination services.   Consent to Services:  The patient was given information about Chronic Care Management services, agreed to services, and gave verbal consent prior to initiation of services.  Please see initial visit note for detailed documentation.   Patient Care Team: [name], MD as PCP - General (Family Medicine) [name], MD as PCP - Cardiology (Cardiology) Deboraha Sprang, MD as PCP - Electrophysiology (Cardiology) Charlton Haws, [facility] as Pharmacist (Pharmacist)  Recent office visits: 06/25/21 Dr [name] OV: annual visit - no changes.  01/22/21 Dr [name] OV: f/u DM. A1c 7.6% - work on lifestyle; switched zegerid to plain omeprazole 40 mg daily. RTC 6 months for CPE.  Recent consult visits: 07/22/21 Dr Caryl Comes (Cardiology): f/u - no changes 03/30/21 Dr Caryl Comes (cardiology): f/u Afib, HF. Variable PVCs. Rx diltiazem 30 mg  PRN  02/16/21 Dr [name] (cardiology): f/u CHF, NICM, Afib.  01/15/21 PA Joesph July (cardiology): f/u NICM. BMP, MG stable. No changes.  Hospital visits: None in previous 6 months   Objective:  Lab Results  Component Value Date   CREATININE 0.81 06/18/2021   BUN 26 (H) 06/18/2021   GFR 96.40 06/18/2021   EGFR 102 01/15/2021   GFRNONAA >60 07/07/2020   GFRAA 117 09/10/2019   NA 140 06/18/2021   K 4.5 06/18/2021   CALCIUM 9.1 06/18/2021   CO2 30 06/18/2021   GLUCOSE 157 (H) 06/18/2021    Lab Results  Component Value Date/Time   HGBA1C 7.2 (H) 06/18/2021 07:47 AM   HGBA1C 7.6 (A) 01/22/2021 12:42 PM   HGBA1C 7.2 (H) 05/27/2020 12:22 PM   GFR 96.40 06/18/2021 07:47 AM   GFR 101.06 05/27/2020 12:22 PM   MICROALBUR 1.7 10/09/2010 11:36 AM    Last diabetic Eye exam:  Lab Results  Component Value Date/Time   HMDIABEYEEXA No Retinopathy 04/07/2020 12:00 AM    Last diabetic Foot exam: No results found for: "HMDIABFOOTEX"   Lab Results  Component Value Date   CHOL 169 06/18/2021   HDL 42.60 06/18/2021   LDLCALC 103 (H) 06/18/2021   LDLDIRECT 97.0 06/16/2016   TRIG 117.0 06/18/2021   CHOLHDL 4 06/18/2021       Latest Ref Rng & Units 06/18/2021    7:47 AM 05/27/2020   12:22 PM 06/21/2019    9:45 AM  Hepatic Function  Total Protein 6.0 - 8.3 g/dL 6.4  6.8  7.0   Albumin 3.5 - 5.2 g/dL 4.4  4.3  4.5   AST 0 - 37 U/L '16  17  19   ' ALT 0 - 53 U/L '17  17  19   ' Alk Phosphatase 39 - 117 U/L 41  44  50   Total Bilirubin 0.2 - 1.2 mg/dL 0.7  0.5  0.6     Lab Results  Component Value Date/Time   TSH 0.84 06/18/2021 07:47 AM   TSH 2.09 07/05/2017 08:39 AM   FREET4 0.90 07/29/2015 06:44 AM       Latest Ref Rng & Units 06/18/2021    7:47 AM 06/01/2019    9:57 AM 04/06/2019   11:52 AM  CBC  WBC 4.0 - 10.5 K/uL 7.2  6.1  7.1   Hemoglobin 13.0 - 17.0 g/dL 15.6  15.9  15.7   Hematocrit 39.0 - 52.0 % 47.4  47.0  46.7   Platelets 150.0 - 400.0 K/uL 155.0  178  180.0      No results found for: "VD25OH"  Clinical ASCVD: No  The 10-year ASCVD risk score (Arnett DK, et al., 2019) is: 12.8%   Values used to calculate the score:     Age: 16 years     Sex: Male     Is Non-Hispanic African American: No     Diabetic: Yes     Tobacco smoker: No     Systolic Blood Pressure: 426 mmHg     Is BP treated: Yes     HDL Cholesterol: 42.6 mg/dL     Total Cholesterol: 169 mg/dL       07/06/2021    9:55 AM 07/03/2020   10:06 AM 06/21/2019    8:50 AM  Depression screen PHQ 2/9  Decreased Interest 0 0 0  Down, Depressed, Hopeless 0 0 0  PHQ - 2 Score 0 0 0  Altered sleeping 0 0   Tired, decreased energy 0 0   Change in appetite 0 0   Feeling bad or failure about yourself  0 0   Trouble concentrating 0 0   Moving slowly or fidgety/restless 0 0   Suicidal thoughts 0 0   PHQ-9 Score 0 0   Difficult doing work/chores Not difficult at all Not difficult at all      CHA2DS2/VAS Stroke Risk Points  Current as of yesterday     4 >= 2 Points: High Risk  1 - 1.99 Points: Medium Risk  0 Points: Low Risk    Last Change: [name]       Points Metrics  1 Has Congestive Heart Failure:  Yes    Current as of yesterday  1 Has Vascular Disease:  Yes    Current as of yesterday  1 Has Hypertension:  Yes    Current as of yesterday  0 Age:  39    Current as of yesterday  1 Has Diabetes:  Yes    Current as of yesterday  0 Had Stroke:  No  Had TIA:  No  Had Thromboembolism:  No    Current as of yesterday  0 Male:  No    Current as of yesterday     Social History   Tobacco Use  Smoking Status Never  Smokeless Tobacco Former   Types: Snuff   Quit date: 10/12/2004  Tobacco Comments   02/14/2013 "quit snuff in 2006"   BP Readings from Last 3 Encounters:  07/22/21 110/78  06/25/21 120/78  03/30/21 132/74   Pulse Readings from Last 3 Encounters:  07/22/21 78  06/25/21 80  03/30/21 94   Wt Readings from Last 3 Encounters:  07/22/21 282 lb 3.2 oz (128 kg)   07/06/21 265 lb (120.2 kg)  06/25/21 280 lb (127 kg)   BMI Readings from Last 3 Encounters:  07/22/21 34.35 kg/m  07/06/21 32.26 kg/m  06/25/21 34.08 kg/m    Assessment/Interventions: Review of patient past medical history, allergies, medications, health status, including review of consultants reports, laboratory and other test data, was performed as part of comprehensive evaluation and provision of chronic care management services.   SDOH:  (Social Determinants of Health) assessments and interventions performed: No - done June 2023 AWV  SDOH Screenings   Alcohol Screen: Low Risk  (07/06/2021)   Alcohol Screen    Last Alcohol Screening Score (AUDIT): 0  Depression (PHQ2-9): Low Risk  (07/06/2021)   Depression (PHQ2-9)    PHQ-2 Score: 0  Financial Resource Strain: Low Risk  (07/06/2021)   Overall Financial Resource Strain (CARDIA)    Difficulty of Paying Living Expenses: Not very hard  Food Insecurity: No Food Insecurity (07/06/2021)   Hunger Vital Sign    Worried About Running Out of Food in the Last Year: Never true    Ran Out of Food in the Last Year: Never true  Housing: Low Risk  (07/06/2021)   Housing    Last Housing Risk Score: 0  Physical Activity: Sufficiently Active (07/06/2021)   Exercise Vital Sign    Days of Exercise per Week: 7 days    Minutes of Exercise per Session: 30 min  Social Connections: Moderately Integrated (07/06/2021)   Social Connection and Isolation Panel [NHANES]    Frequency of Communication with Friends and Family: More than three times a week    Frequency of Social Gatherings with Friends and Family: Three times a week    Attends Religious Services: More than 4 times per year    Active Member of Clubs or Organizations: No    Attends Archivist Meetings: Never    Marital Status: Married  Stress: No Stress Concern Present (07/06/2021)   [place]    Feeling of Stress :  Not at all  Tobacco Use: Medium Risk (07/22/2021)   Patient History    Smoking Tobacco Use: Never    Smokeless Tobacco Use: Former    Passive Exposure: Not on file  Transportation Needs: No Transportation Needs (07/06/2021)   PRAPARE - Hydrologist (Medical): No    Lack of Transportation (Non-Medical): No    CCM Care Plan  Allergies  Allergen Reactions   Atorvastatin     Muscle pain     Medications Reviewed Today     Reviewed by Charlton Haws, [facility] (Pharmacist) on 07/22/21 at 1123  Med List Status: <None>   Medication Order Taking? Sig Documenting Provider Last Dose Status Informant  acetaminophen (TYLENOL) 325 MG tablet [id number] Yes Take 650 mg by mouth every 6 (six) hours as needed for mild pain or moderate pain. [provider] Taking Active Self  ALPRAZolam Duanne Moron) 0.25 MG tablet [id number] Yes TAKE 1 TABLET BY MOUTH DAILY AS NEEDED FOR ANXIETY [name], MD Taking Active   Blood Glucose Monitoring Suppl (ONE TOUCH ULTRA 2) w/Device KIT [id number] Yes Use daily to check sugars as needed. Dx E11.9 [name], MD Taking Active   carvedilol (COREG) 25 MG tablet [id number] Yes TAKE 2 TABLETS(50 MG) BY MOUTH TWICE DAILY WITH A MEAL Deboraha Sprang, MD  Taking Active   Clobetasol Prop Emollient Base (CLOBETASOL PROPIONATE E) 0.05 % emollient cream [id number] Yes Apply 1 Application topically 2 (two) times daily. Lavonna Monarch, MD Taking Active   Continuous Blood Gluc Receiver (FREESTYLE LIBRE 2 READER) [place] [id number] Yes Use with sensors to monitor sugar continuously [name], MD Taking Active   Continuous Blood Gluc Sensor (FREESTYLE LIBRE 2 SENSOR) [place] [id number] Yes Apply sensor every 14 days to monitor sugar continously [name], MD Taking Active   diltiazem (CARDIZEM) 30 MG tablet [id number] Yes Take 1 tablet as needed.  May repeat dose in 4 hours x 1 if needed. Deboraha Sprang, MD Taking Active   dofetilide  [facility]) 500 MCG capsule [id number] Yes TAKE 1 CAPSULE(500 MCG) BY MOUTH TWICE DAILY [name], MD Taking Active   ENTRESTO 97-103 MG [id number] Yes TAKE 1 TABLET BY MOUTH TWICE DAILY [name], MD Taking Active   furosemide (LASIX) 40 MG tablet [id number] Yes Take 1 tablet (40 mg total) by mouth every Friday. Call office to schedule an appointment for further refills Bensimhon, Shaune Pascal, MD Taking Active   glipiZIDE (GLUCOTROL XL) 10 MG 24 hr tablet [id number] Yes TAKE 1 TABLET(10 MG) BY MOUTH DAILY WITH BREAKFAST [name], MD Taking Active   JARDIANCE 25 MG TABS tablet [id number] Yes TAKE 1 TABLET BY MOUTH EVERY DAY [name], MD Taking Active   loratadine (CLARITIN) 10 MG tablet [id number] Yes Take 10 mg by mouth daily as needed for allergies. [provider] Taking Active   Magnesium Oxide 400 MG CAPS [id number] Yes Take 1 capsule (400 mg total) by mouth daily. Deboraha Sprang, MD Taking Active   metFORMIN (GLUCOPHAGE-XR) 500 MG 24 hr tablet [id number] Yes TAKE 3 TABLETS(1500 MG) BY MOUTH DAILY WITH BREAKFAST [name], MD Taking Active   Multiple Vitamin (MULTIVITAMIN) tablet [id number] Yes Take 1 tablet by mouth daily. [provider] Taking Active Self  omeprazole (PRILOSEC) 40 MG capsule [id number] Yes Take 1 capsule (40 mg total) by mouth daily. [name], MD Taking Active   [facility] ULTRA test strip [id number] Yes CHECK GLUCOSE TWICE DAILY AND AS NEEDED [name], MD Taking Active   potassium chloride SA (KLOR-CON) 20 MEQ tablet [id number] Yes TAKE 2 TABLETS(40 MEQ) BY MOUTH TWICE DAILY [name], MD Taking Active   pravastatin (PRAVACHOL) 10 MG tablet [id number] Yes TAKE 1 TABLET(10 MG) BY MOUTH DAILY [name], MD Taking Active   rivaroxaban (XARELTO) 20 MG TABS tablet [id number] Yes TAKE 1 TABLET(20 MG) BY MOUTH DAILY WITH SUPPER Deboraha Sprang, MD Taking Active   spironolactone (ALDACTONE) 25 MG tablet [id number] Yes TAKE 1  TABLET(25 MG) BY MOUTH DAILY [name], MD Taking Active   traMADol (ULTRAM) 50 MG tablet [id number] Yes TAKE 1 TABLET(50 MG) BY MOUTH EVERY 12 HOURS AS NEEDED FOR PAIN [name], MD Taking Active   Med List Note Dennie Fetters, LPN 33/54/56 2563):              Patient Active Problem List   Diagnosis Date Noted   LBBB (left bundle branch block) 07/21/2021   Tendinitis of thumb 05/28/2020   Neck pain 04/02/2020   Ear pain 04/02/2020   ICD (implantable cardioverter-defibrillator) in place 02/04/2020   Shoulder pain, bilateral 12/01/2018   Low back pain 09/07/2016   Family history of prostate cancer 06/23/2016   Advance care planning 06/23/2016   Healthcare maintenance 03/08/2016  A-fib ([place]) 10/14/2015   Inappropriate shocks from ICD (implantable cardioverter-defibrillator) 03/17/2015   Obesity (BMI 30-39.9) 12/05/2014   ICD (implantable cardioverter-defibrillator) discharge 97/74/1423   Chronic systolic heart failure ([place]) 02/14/2013   Hyperlipidemia 02/05/2013   Diabetes mellitus without complication ([place]) 95/32/0233   Nonischemic cardiomyopathy ([place]) 08/18/2011   Chest pain on exertion 08/17/2011   Other restrictive cardiomyopathy ([place]) 07/16/2009   OSTEOARTHROSIS UNSPEC WHETHER GEN/LOCALIZED HAND 08/16/2008   Essential hypertension 05/11/2007   History of renal calculi 05/11/2007   Fatty liver 03/20/2007   Anxiety state 10/04/2006   HEARING IMPAIRMENT 10/04/2006   GERD 09/20/2006    Immunization History  Administered Date(s) Administered   Influenza, Seasonal, Injecte, Preservative Fre 12/03/2013   Influenza,inj,Quad PF,6+ Mos 10/05/2016, 11/04/2017, 12/01/2018, 01/22/2021   Influenza-Unspecified 12/09/2012, 11/24/2014, 11/20/2019   Moderna Sars-Covid-2 Vaccination 05/13/2019, 06/12/2019, 10/04/2019   Pneumococcal Polysaccharide-23 09/30/2011   Td 07/23/2015    Conditions to be addressed/monitored:  Hypertension, Hyperlipidemia, Diabetes, Atrial  Fibrillation, and Heart Failure  Care Plan : [place]  Updates made by Charlton Haws, [place] since 07/22/2021 12:00 AM     Problem: Hypertension, Hyperlipidemia, Diabetes, Atrial Fibrillation, and Heart Failure   Priority: High     Goal: Disease Management   Start Date: 05/15/2020  Expected End Date: 05/28/2022  This Visit's Progress: On track  Recent Progress: Not on track  Priority: High  Note:   Current Barriers:  None identified  Pharmacist Clinical Goal(s):  Patient will contact provider office for questions/concerns as evidenced notation of same in electronic health record through collaboration with PharmD and provider.   Interventions: 1:1 collaboration with [name], MD regarding development and update of comprehensive plan of care as evidenced by provider attestation and co-signature Inter-disciplinary care team collaboration (see longitudinal plan of care) Comprehensive medication review performed; medication list updated in electronic medical record  Hyperlipidemia: (LDL goal < 70) -Not ideally controlled - LDL 103 (06/18/21) above goal, recently switched to pravastatin 05/27/21 so labwork represents about 3 weeks on pravastatin -Current treatment: Pravastatin 10 mg daily- Appropriate, Query Effective -Medications previously tried: atorvastatin (myalgias); Livalo daily (myalgias) -Given DM, NICM LDL goal < 70 is recommended -Educated on Cholesterol goals; Benefits of statin for ASCVD risk reduction; -Recommend to continue current medication  Diabetes (A1c goal <7%) -Not ideally controlled - A1c 7.2% (05/2021) above goal but improving; pt recently started using [facility] and is concerned that readings are different than BG (finger sticks); reviewed differences between sensor glucose and blood glucose -Current home glucose readings - Freestyle Libre 2 (w/ reader)  Avg: 151; range 137-166 -Denies hypoglycemic/hyperglycemic symptoms -Current  medications: Jardiance 25 mg daily -Appropriate, Query Effective Metformin ER 500 mg - 3 tablets AM -Appropriate, Query Effective Glipizide XL 10 mg daily AM -Appropriate, Query Effective Freestyle Libre 2 -Medications previously tried: metformin IR (GI) -Current meal patterns: tries to avoid bread, chips - started snacking on unsalted peanuts instead -Current exercise: [employer] 2x a week; walks 30 min a few times a week -Educated on A1c and blood sugar goals -Recommended to continue current medication  Atrial Fibrillation (Goal: prevent stroke and major bleeding) -Controlled - follows with Dr Caryl Comes -CHADSVASC=4; S/p ablation -Current treatment: Dofetilide 500 mcg BID -Appropriate, Effective, Safe, Accessible Carvedilol 25 mg - 2 tab BID -Appropriate, Effective, Safe, Accessible Diltiazem 30 mg PRN (PVCs) -Appropriate, Effective, Safe, Accessible Xarelto 20 mg daily -Appropriate, Effective, Safe, Accessible -Medications previously tried: n/a -Recommended to continue current medication  Heart Failure /  Hypetension (Goal: BP < 130/80) -Controlled -Follows with Dr [name] -Last ejection fraction: 25-30% (Date: 2020) -HF type: Systolic; NYHA Class: II (slight limitation of activity); w/ ICD -Current home BP/HR readings: n/a -He does not weigh daily. He does watch for symptoms of fluid overload.  -Hx OSA, unable to tolerate CPAP. -Current treatment: Carvedilol 25 mg - 2 tablet BID - Appropriate, Effective, Safe, Accessible Spironolactone 25 mg daily -Appropriate, Effective, Safe, Accessible Entresto 97-103 mg BID -Appropriate, Effective, Safe, Accessible Klor Con 20 mEq ER - 2 tablets daily -Appropriate, Effective, Safe, Accessible Furosemide 40 mg - 1 tab Fridays -Appropriate, Effective, Safe, Accessible Jardiance 25 mg daily -Appropriate, Effective, Safe, Accessible -Medications previously tried: none  -Recommended to continue current medication  Patient Goals/Self-Care  Activities Patient will:  - take medications as prescribed as evidenced by patient report and record review -focus on medication adherence by routine -check glucose via CGM, document, and provide at future appointments -check blood pressure most days, document, and provide at future appointments -weigh daily, and contact provider if weight gain of 3+ lbs overnight,5+ lbs in a week -collaborate with provider on medication access solutions        Medication Assistance:  Jardiance, [place] (03/08/21 - 03/07/22) - through cardiology Dofetilide - tier exception approved   Compliance/Adherence/Medication fill history: Care Gaps: Eye exam - due 04/07/21  Star-Rating Drugs: Glipizide 10 mg           07/06/21          90 Jardiance 25 mg         05/03/21          90         Metformin 500 mg       07/02/21          90 Pravastatin 10 mg       05/27/21          90  Medication Access: Within the past 30 days, how often has patient missed a dose of medication? 0 Is a pillbox or other method used to improve adherence? Yes  Factors that may affect medication adherence? no barriers identified Are meds synced by current pharmacy? No  Are meds delivered by current pharmacy? No  Does patient experience delays in picking up medications due to transportation concerns? No   [facility] Services Reviewed: Is patient disadvantaged to use [facility]?: No  Current Rx insurance plan: HTA Name and location of Current pharmacy:  [facility] [place], [place] [address] Phone: [phone] Fax: [phone]  [facility] services reviewed with patient today?: No  Patient requests to transfer care to [facility]?: No  Reason patient declined to change pharmacies: Not mentioned at this visit   Plan: Telephone follow up appointment with care management team member  scheduled for:  7 months  Charlene Brooke, PharmD, BCACP Clinical Pharmacist [facility] [phone]

## 2021-07-22 NOTE — Patient Instructions (Signed)

## 2021-07-28 ENCOUNTER — Other Ambulatory Visit: Payer: Self-pay | Admitting: Cardiovascular Disease

## 2021-07-29 ENCOUNTER — Encounter: Payer: Self-pay | Admitting: Dermatology

## 2021-08-02 ENCOUNTER — Encounter: Payer: Self-pay | Admitting: Dermatology

## 2021-08-02 NOTE — Progress Notes (Signed)
   Follow-Up Visit   Subjective  Aaron Thompson is a 61 y.o. male who presents for the following: Rash (Right arm x 2 weeks- won't go away & does itch- tx- Tac 0.1 % cream- just started Pravastatin 10 mg right before he noticed the cream).  No asymptomatic rash on right upper arm Location:  Duration:  Quality:  Associated Signs/Symptoms: Modifying Factors:  Severity:  Timing: Context:   Objective  Well appearing patient in no apparent distress; mood and affect are within normal limits. Right Upper Arm - Anterior, Right Upper Arm - Posterior Subtle light pink 2 mm pink dermal papules some of which are [place] form and incomplete ovals.  No local adenopathy.  No involvement elsewhere.  This clinically best fits granuloma annulare.  We discussed obtaining a biopsy but since there has been no effect on his general health and the area is asymptomatic, this will be deferred.    A focused examination was performed including head, neck, arms.. Relevant physical exam findings are noted in the Assessment and Plan.   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      I, Lavonna Monarch, MD, have reviewed all documentation for this visit.  The documentation on 08/02/21 for the exam, diagnosis, procedures, and orders are all accurate and complete.

## 2021-08-05 ENCOUNTER — Other Ambulatory Visit: Payer: Self-pay | Admitting: Family Medicine

## 2021-08-05 NOTE — Telephone Encounter (Signed)
Refill request for ALPRAZOLAM 0.'25MG'$  TABLETS  LOV - 06/25/21 Next OV - 12/25/21 Last refill - 05/26/21 #30/1

## 2021-09-02 ENCOUNTER — Ambulatory Visit (INDEPENDENT_AMBULATORY_CARE_PROVIDER_SITE_OTHER): Payer: HMO

## 2021-09-02 ENCOUNTER — Other Ambulatory Visit: Payer: Self-pay | Admitting: Family Medicine

## 2021-09-02 DIAGNOSIS — I428 Other cardiomyopathies: Secondary | ICD-10-CM

## 2021-09-02 NOTE — Telephone Encounter (Signed)
Refill request for TRAMADOL '50MG'$  TABLETS  LOV - 06/25/21 Next OV - 12/25/21 Last refill - 06/02/21 #60/2

## 2021-09-03 ENCOUNTER — Other Ambulatory Visit (HOSPITAL_COMMUNITY): Payer: Self-pay

## 2021-09-03 LAB — CUP PACEART REMOTE DEVICE CHECK
Battery Remaining Longevity: 55 mo
Battery Remaining Percentage: 64 %
Battery Voltage: 2.96 V
Brady Statistic AP VP Percent: 1 %
Brady Statistic AP VS Percent: 1 %
Brady Statistic AS VP Percent: 95 %
Brady Statistic AS VS Percent: 2.2 %
Brady Statistic RA Percent Paced: 1 %
Date Time Interrogation Session: 20230810015024
HighPow Impedance: 88 Ohm
HighPow Impedance: 88 Ohm
Implantable Lead Implant Date: 20150121
Implantable Lead Implant Date: 20150121
Implantable Lead Implant Date: 20150121
Implantable Lead Location: 753858
Implantable Lead Location: 753859
Implantable Lead Location: 753860
Implantable Lead Model: 7122
Implantable Pulse Generator Implant Date: 20210512
Lead Channel Impedance Value: 450 Ohm
Lead Channel Impedance Value: 490 Ohm
Lead Channel Impedance Value: 540 Ohm
Lead Channel Pacing Threshold Amplitude: 0.75 V
Lead Channel Pacing Threshold Amplitude: 1.25 V
Lead Channel Pacing Threshold Amplitude: 1.25 V
Lead Channel Pacing Threshold Pulse Width: 0.4 ms
Lead Channel Pacing Threshold Pulse Width: 0.4 ms
Lead Channel Pacing Threshold Pulse Width: 0.5 ms
Lead Channel Sensing Intrinsic Amplitude: 11.7 mV
Lead Channel Sensing Intrinsic Amplitude: 4.9 mV
Lead Channel Setting Pacing Amplitude: 1.75 V
Lead Channel Setting Pacing Amplitude: 2.25 V
Lead Channel Setting Pacing Amplitude: 2.25 V
Lead Channel Setting Pacing Pulse Width: 0.4 ms
Lead Channel Setting Pacing Pulse Width: 0.5 ms
Lead Channel Setting Sensing Sensitivity: 0.5 mV
Pulse Gen Serial Number: 9890691

## 2021-09-03 MED ORDER — FUROSEMIDE 40 MG PO TABS: 40.0000 mg | ORAL_TABLET | ORAL | 1 refills | Status: DC

## 2021-09-21 ENCOUNTER — Encounter: Payer: Self-pay | Admitting: Internal Medicine

## 2021-09-23 ENCOUNTER — Ambulatory Visit: Payer: HMO | Admitting: Physician Assistant

## 2021-10-01 ENCOUNTER — Other Ambulatory Visit: Payer: Self-pay | Admitting: Family Medicine

## 2021-10-02 NOTE — Telephone Encounter (Signed)
Last filled 09-02-21 #30 Last OV 06-25-21 Next OV 12-25-21 [facility]

## 2021-10-03 NOTE — Telephone Encounter (Signed)
Sent. Thanks.   

## 2021-10-06 NOTE — Progress Notes (Signed)
Remote ICD transmission.   

## 2021-10-12 ENCOUNTER — Telehealth: Payer: Self-pay

## 2021-10-12 NOTE — Progress Notes (Signed)
Chronic Care Management Pharmacy Assistant   Name: Aaron Thompson  MRN: [id number] DOB: Mar 26, 1960  Reason for Encounter: CCM (Diabetes Disease State)  Recent office visits:  None since last CCM contact  Recent consult visits:  None since last CCM contact  Hospital visits:  None in previous 6 months  Medications: Outpatient Encounter Medications as of 10/12/2021  Medication Sig   acetaminophen (TYLENOL) 325 MG tablet Take 650 mg by mouth every 6 (six) hours as needed for mild pain or moderate pain.   ALPRAZolam (XANAX) 0.25 MG tablet TAKE 1 TABLET BY MOUTH DAILY AS NEEDED FOR ANXIETY   Blood Glucose Monitoring Suppl (ONE TOUCH ULTRA 2) w/Device KIT Use daily to check sugars as needed. Dx E11.9   carvedilol (COREG) 25 MG tablet TAKE 2 TABLETS(50 MG) BY MOUTH TWICE DAILY WITH A MEAL   Clobetasol Prop Emollient Base (CLOBETASOL PROPIONATE E) 0.05 % emollient cream Apply 1 Application topically 2 (two) times daily.   Continuous Blood Gluc Receiver (FREESTYLE LIBRE 2 READER) DEVI Use with sensors to monitor sugar continuously   Continuous Blood Gluc Sensor (FREESTYLE LIBRE 2 SENSOR) MISC Apply sensor every 14 days to monitor sugar continously   diltiazem (CARDIZEM) 30 MG tablet Take 1 tablet as needed.  May repeat dose in 4 hours x 1 if needed.   dofetilide (TIKOSYN) 500 MCG capsule TAKE 1 CAPSULE(500 MCG) BY MOUTH TWICE DAILY   ENTRESTO 97-103 MG TAKE 1 TABLET BY MOUTH TWICE DAILY   furosemide (LASIX) 40 MG tablet Take 1 tablet (40 mg total) by mouth every Friday. Absolute last refill must schedule an appointment   glipiZIDE (GLUCOTROL XL) 10 MG 24 hr tablet TAKE 1 TABLET(10 MG) BY MOUTH DAILY WITH BREAKFAST   JARDIANCE 25 MG TABS tablet TAKE 1 TABLET BY MOUTH EVERY DAY   loratadine (CLARITIN) 10 MG tablet Take 10 mg by mouth daily as needed for allergies.   Magnesium Oxide 400 MG CAPS Take 1 capsule (400 mg total) by mouth daily.   metFORMIN (GLUCOPHAGE-XR) 500 MG 24 hr tablet TAKE  3 TABLETS(1500 MG) BY MOUTH DAILY WITH BREAKFAST   Multiple Vitamin (MULTIVITAMIN) tablet Take 1 tablet by mouth daily.   omeprazole (PRILOSEC) 40 MG capsule Take 1 capsule (40 mg total) by mouth daily.   ONETOUCH ULTRA test strip CHECK GLUCOSE TWICE DAILY AND AS NEEDED   potassium chloride SA (KLOR-CON) 20 MEQ tablet TAKE 2 TABLETS(40 MEQ) BY MOUTH TWICE DAILY   pravastatin (PRAVACHOL) 10 MG tablet TAKE 1 TABLET(10 MG) BY MOUTH DAILY   rivaroxaban (XARELTO) 20 MG TABS tablet TAKE 1 TABLET(20 MG) BY MOUTH DAILY WITH SUPPER   spironolactone (ALDACTONE) 25 MG tablet TAKE 1 TABLET(25 MG) BY MOUTH DAILY   traMADol (ULTRAM) 50 MG tablet TAKE 1 TABLET(50 MG) BY MOUTH EVERY 12 HOURS AS NEEDED FOR PAIN   No facility-administered encounter medications on file as of 10/12/2021.     Recent Relevant Labs: Lab Results  Component Value Date/Time   HGBA1C 7.2 (H) 06/18/2021 07:47 AM   HGBA1C 7.6 (A) 01/22/2021 12:42 PM   HGBA1C 7.2 (H) 05/27/2020 12:22 PM   MICROALBUR 1.7 10/09/2010 11:36 AM    Kidney Function Lab Results  Component Value Date/Time   CREATININE 0.81 06/18/2021 07:47 AM   CREATININE 0.80 01/15/2021 12:34 PM   CREATININE 0.77 10/01/2015 12:02 PM   CREATININE 0.91 08/28/2015 09:09 AM   GFR 96.40 06/18/2021 07:47 AM   GFRNONAA >60 07/07/2020 10:46 AM   GFRAA 117 09/10/2019  09:58 AM   Contacted patient on 10/12/2021 to discuss diabetes disease state.   Current antihyperglycemic regimen:  Jardiance 25 mg daily  Metformin ER 500 mg - 3 tablets AM  Glipizide XL 10 mg daily AM  Freestyle Libre 2   Patient verbally confirms he is taking the above medications as directed. Yes  What diet changes have been made to improve diabetes control? Patient tries to eat low carb; every once in a while he will eat ice cream. Patient mows yards (5 yards) for exercise. Patient also will walk the road approx. 1/2  - 1 mile. He has lost 12 - 15 lbs.   What recent interventions/DTPs have been made to  improve glycemic control:  Continue current medications  Have there been any recent hospitalizations or ED visits since last visit with CPP? No  Patient denies hypoglycemic symptoms, including Pale, Sweaty, Shaky, Hungry, Nervous/irritable, and Vision changes  Patient denies hyperglycemic symptoms, including blurry vision, excessive thirst, fatigue, polyuria, and weakness  How often are you checking your blood sugar? once daily  During the week, how often does your blood glucose drop below 70? Never  Are you checking your feet daily/regularly? Yes  Adherence Review: Is the patient currently on a STATIN medication? Yes Is the patient currently on ACE/ARB medication? No Does the patient have >5 day gap between last estimated fill dates? No  Care Gaps: Annual wellness visit in last year? Yes 07/06/21 Most recent A1C reading: 7.2 on 06/18/21 Most Recent BP reading: 110/78 on 07/22/21  Last eye exam / retinopathy screening: Up to date Last diabetic foot exam: Up to date  Summary of recommendations from last [place] visit (Date:07/22/21)  Summary: CCM F/U visit -Reviewed medications; pt affirms compliance as prescribed -DM: A1c 7.2% (05/2021) improved from 7.6%; avg glucose per CGM is 151. Pt reports CGM glucose is different from BG (finger sticks) - reviewed they will usually be different as they are measuring different things (interstitial space vs blood) -HLD: LDL 103 (05/2021); pt reports tolerating pravastatin well so far   Recommendations/Changes made from today's visit: -No med changes   Plan: -[place] will call patient 3 months for DM update -Pharmacist follow up televisit scheduled for 7 months -PCP f/u 12/25/21  [name]:  Medication:  Last Fill: Day Supply Glipizide 10 mg 09/22/21 90  Jardiance 25 mg 08/03/21 90         Metformin 500 mg 09/22/21 90 Pravastatin 10 mg 08/25/21 90  PCP appointment on 12/25/21 CCM appointment on  02/04/2021  Charlene Brooke, CPP notified  Marijean Niemann, [place] Assistant [phone]

## 2021-10-16 ENCOUNTER — Encounter: Payer: Self-pay | Admitting: Internal Medicine

## 2021-10-16 ENCOUNTER — Encounter: Payer: Self-pay | Admitting: Family Medicine

## 2021-10-24 ENCOUNTER — Other Ambulatory Visit: Payer: Self-pay | Admitting: Family Medicine

## 2021-11-18 ENCOUNTER — Encounter: Payer: Self-pay | Admitting: Cardiovascular Disease

## 2021-11-18 ENCOUNTER — Other Ambulatory Visit (HOSPITAL_COMMUNITY): Payer: Self-pay | Admitting: Internal Medicine

## 2021-12-02 ENCOUNTER — Ambulatory Visit (INDEPENDENT_AMBULATORY_CARE_PROVIDER_SITE_OTHER): Payer: HMO

## 2021-12-02 ENCOUNTER — Other Ambulatory Visit: Payer: Self-pay | Admitting: Family Medicine

## 2021-12-02 DIAGNOSIS — I428 Other cardiomyopathies: Secondary | ICD-10-CM | POA: Diagnosis not present

## 2021-12-02 NOTE — Telephone Encounter (Signed)
Refill request for TRAMADOL '50MG'$  TABLETS   LOV - 06/25/21 Next OV - 12/25/21 Last refill - 09/03/21 #60/2

## 2021-12-03 LAB — CUP PACEART REMOTE DEVICE CHECK
Battery Remaining Longevity: 52 mo
Battery Remaining Percentage: 60 %
Battery Voltage: 2.96 V
Brady Statistic AP VP Percent: 1.3 %
Brady Statistic AP VS Percent: 1 %
Brady Statistic AS VP Percent: 94 %
Brady Statistic AS VS Percent: 2.7 %
Brady Statistic RA Percent Paced: 1 %
Date Time Interrogation Session: 20231108054827
HighPow Impedance: 83 Ohm
HighPow Impedance: 83 Ohm
Implantable Lead Connection Status: 753985
Implantable Lead Connection Status: 753985
Implantable Lead Connection Status: 753985
Implantable Lead Implant Date: 20150121
Implantable Lead Implant Date: 20150121
Implantable Lead Implant Date: 20150121
Implantable Lead Location: 753858
Implantable Lead Location: 753859
Implantable Lead Location: 753860
Implantable Lead Model: 7122
Implantable Pulse Generator Implant Date: 20210512
Lead Channel Impedance Value: 450 Ohm
Lead Channel Impedance Value: 480 Ohm
Lead Channel Impedance Value: 480 Ohm
Lead Channel Pacing Threshold Amplitude: 0.875 V
Lead Channel Pacing Threshold Amplitude: 1.125 V
Lead Channel Pacing Threshold Amplitude: 1.375 V
Lead Channel Pacing Threshold Pulse Width: 0.4 ms
Lead Channel Pacing Threshold Pulse Width: 0.4 ms
Lead Channel Pacing Threshold Pulse Width: 0.5 ms
Lead Channel Sensing Intrinsic Amplitude: 12 mV
Lead Channel Sensing Intrinsic Amplitude: 5 mV
Lead Channel Setting Pacing Amplitude: 1.875
Lead Channel Setting Pacing Amplitude: 2.125
Lead Channel Setting Pacing Amplitude: 2.375
Lead Channel Setting Pacing Pulse Width: 0.4 ms
Lead Channel Setting Pacing Pulse Width: 0.5 ms
Lead Channel Setting Sensing Sensitivity: 0.5 mV
Pulse Gen Serial Number: 9890691

## 2021-12-06 ENCOUNTER — Other Ambulatory Visit: Payer: Self-pay | Admitting: Cardiovascular Disease

## 2021-12-06 ENCOUNTER — Other Ambulatory Visit (HOSPITAL_COMMUNITY): Payer: Self-pay | Admitting: Internal Medicine

## 2021-12-21 NOTE — Progress Notes (Signed)
Remote ICD transmission.   

## 2021-12-25 ENCOUNTER — Other Ambulatory Visit: Payer: Self-pay | Admitting: Family Medicine

## 2021-12-25 ENCOUNTER — Other Ambulatory Visit: Payer: Self-pay

## 2021-12-25 ENCOUNTER — Encounter: Payer: Self-pay | Admitting: Family Medicine

## 2021-12-25 ENCOUNTER — Ambulatory Visit (INDEPENDENT_AMBULATORY_CARE_PROVIDER_SITE_OTHER): Payer: HMO | Admitting: Family Medicine

## 2021-12-25 VITALS — BP 118/80 | HR 91 | Temp 97.1°F | Ht 76.0 in | Wt 283.0 lb

## 2021-12-25 DIAGNOSIS — E119 Type 2 diabetes mellitus without complications: Secondary | ICD-10-CM

## 2021-12-25 DIAGNOSIS — I4891 Unspecified atrial fibrillation: Secondary | ICD-10-CM | POA: Diagnosis not present

## 2021-12-25 DIAGNOSIS — Z23 Encounter for immunization: Secondary | ICD-10-CM

## 2021-12-25 LAB — POCT GLYCOSYLATED HEMOGLOBIN (HGB A1C): Hemoglobin A1C: 7.2 % — AB (ref 4.0–5.6)

## 2021-12-25 MED ORDER — PRAVASTATIN SODIUM 10 MG PO TABS: 10.0000 mg | ORAL_TABLET | ORAL | Status: DC

## 2021-12-25 MED ORDER — DILTIAZEM HCL 30 MG PO TABS: ORAL_TABLET | ORAL | 1 refills | Status: DC

## 2021-12-25 MED ORDER — RIVAROXABAN 20 MG PO TABS: ORAL_TABLET | ORAL | 5 refills | Status: DC

## 2021-12-25 NOTE — Telephone Encounter (Signed)
Prescription refill request for Xarelto received.  Indication: Afib  Last office visit: 07/22/21 Caryl Comes)  Weight: 128.4kg Age: 61 Scr: 0.81(06/18/21)  CrCl: 176.85m/min  Appropriate dose and refill sent to requested pharmacy.

## 2021-12-25 NOTE — Progress Notes (Signed)
Diabetes:  Using medications without difficulties: yes Hypoglycemic episodes: one episode, with prolonged fasting, cautions d/w pt.  Corrected with a snack Hyperglycemic episodes: no Feet problems: no Blood Sugars averaging: usually ~ 120-160 eye exam within last year: yes A1c 7.2.   He has been going to [employer], exercising.  He had aches with daily pravastatin but can tolerate every other day dosing.    Flu shot done at [facility].  D/w pt about shingles shot.  He can get shingles done later.    He had used prn diltiazem for tachycardia.  Refill sent in the meantime.  Routed to cards as FYI.    Meds, vitals, and allergies reviewed.  ROS: Per HPI unless specifically indicated in ROS section   GEN: nad, alert and oriented HEENT: ncat NECK: supple w/o LA CV: rrr. PULM: ctab, no inc wob ABD: soft, +bs EXT: no edema SKIN: well perfused.    Diabetic foot exam: Normal inspection No skin breakdown No calluses  Normal DP pulses Normal sensation to light touch and monofilament Nails normal

## 2021-12-25 NOTE — Patient Instructions (Addendum)
Recheck at yearly visit in about 6 months.   Thanks for your effort.   Take care.  Glad to see you.

## 2021-12-25 NOTE — Assessment & Plan Note (Signed)
Diltiazem rx sent.  Routed to cards as FYI.  Still with good exertional capacity, ie still working out.

## 2021-12-25 NOTE — Assessment & Plan Note (Signed)
Continue work on diet and exercise,  continue metformin glipizide and jardiance.  Recheck in 6 months.  Cautions re: low sugars.  A1c d/w pt.

## 2022-01-04 ENCOUNTER — Other Ambulatory Visit: Payer: Self-pay | Admitting: Family Medicine

## 2022-01-05 NOTE — Telephone Encounter (Signed)
Refill request for ALPRAZOLAM 0.'25MG'$  TABLETS   LOV - 12/25/21 Next OV - not scheduled Last refill - 10/03/21 #30/2

## 2022-01-07 ENCOUNTER — Other Ambulatory Visit: Payer: Self-pay | Admitting: Family Medicine

## 2022-01-11 ENCOUNTER — Other Ambulatory Visit: Payer: Self-pay | Admitting: *Deleted

## 2022-01-11 MED ORDER — FUROSEMIDE 40 MG PO TABS: ORAL_TABLET | ORAL | 0 refills | Status: DC

## 2022-01-13 ENCOUNTER — Other Ambulatory Visit: Payer: Self-pay | Admitting: Cardiovascular Disease

## 2022-01-24 ENCOUNTER — Other Ambulatory Visit: Payer: Self-pay | Admitting: Cardiovascular Disease

## 2022-01-26 ENCOUNTER — Encounter: Payer: Self-pay | Admitting: Internal Medicine

## 2022-01-26 ENCOUNTER — Ambulatory Visit (INDEPENDENT_AMBULATORY_CARE_PROVIDER_SITE_OTHER): Payer: PPO | Admitting: Internal Medicine

## 2022-01-26 ENCOUNTER — Encounter: Payer: Self-pay | Admitting: Cardiovascular Disease

## 2022-01-26 VITALS — BP 124/74 | HR 48 | Temp 97.5°F | Ht 76.0 in | Wt 287.0 lb

## 2022-01-26 DIAGNOSIS — J0110 Acute frontal sinusitis, unspecified: Secondary | ICD-10-CM | POA: Diagnosis not present

## 2022-01-26 MED ORDER — AMOXICILLIN-POT CLAVULANATE 875-125 MG PO TABS: 1.0000 | ORAL_TABLET | Freq: Two times a day (BID) | ORAL | 0 refills | Status: DC

## 2022-01-26 MED ORDER — HYDROCODONE BIT-HOMATROP MBR 5-1.5 MG/5ML PO SOLN: 5.0000 mL | Freq: Every evening | ORAL | 0 refills | Status: DC | PRN

## 2022-01-26 NOTE — Assessment & Plan Note (Signed)
Likely still viral Will Rx hycodan for cough at night Continue tylenol If worsens at the end of the week, fill the written Rx for augmentin 875 bid x 7 days

## 2022-01-26 NOTE — Progress Notes (Signed)
 Subjective:    Patient ID: Aaron Thompson, male    DOB: 11/29/1960, 61 y.o.   MRN: [id number]  HPI Here due to respiratory illness  Lots of illness in family--son with flu and others He left 2 days before symptoms (or maybe just 1 day) Thinks he may have gotten sick from wife She got ill last week--went to urgent care and was negative for flu, COVID, RSV No Rx--but still having symptoms  Started 2 days ago He is concerned due to his CHF, etc Started with a lot of drainage---tried coricidin, loratadine Yesterday AM--coughed up some green stuff--then that stopped Is having post nasal drip and it tastes bad Bad cough last night---got more green nasty sputum today  Feels it is in his chest---with the drainage Heart is beating irregular--feels the a fib is back Some SOB--thinks it may be from that  No fever No chills, sweats or aching  Some sore throat No ear pain  Has taken some tylenol as well  Current Outpatient Medications on File Prior to Visit  Medication Sig Dispense Refill   acetaminophen (TYLENOL) 325 MG tablet Take 650 mg by mouth every 6 (six) hours as needed for mild pain or moderate pain.     ALPRAZolam (XANAX) 0.25 MG tablet TAKE 1 TABLET BY MOUTH DAILY AS NEEDED FOR ANXIETY 30 tablet 2   Blood Glucose Monitoring Suppl (ONE TOUCH ULTRA 2) w/Device KIT Use daily to check sugars as needed. Dx E11.9 1 kit 0   carvedilol (COREG) 25 MG tablet TAKE 2 TABLETS(50 MG) BY MOUTH TWICE DAILY WITH A MEAL 360 tablet 3   diltiazem (CARDIZEM) 30 MG tablet TAKE 1 TABLET BY MOUTH AS NEEDED, MAY REPEAT DOSE IN 4 HOURS ONCE IF NEEDED 90 tablet 0   dofetilide (TIKOSYN) 500 MCG capsule TAKE 1 CAPSULE(500 MCG) BY MOUTH TWICE DAILY 180 capsule 1   ENTRESTO 97-103 MG TAKE 1 TABLET BY MOUTH TWICE DAILY 180 tablet 2   furosemide (LASIX) 40 MG tablet TAKE 1 TABLET BY MOUTH EVERY FRIDAY 4 tablet 0   glipiZIDE (GLUCOTROL XL) 10 MG 24 hr tablet TAKE 1 TABLET(10 MG) BY MOUTH DAILY WITH BREAKFAST  30 tablet 12   JARDIANCE 25 MG TABS tablet TAKE 1 TABLET BY MOUTH EVERY DAY 90 tablet 1   loratadine (CLARITIN) 10 MG tablet Take 10 mg by mouth daily as needed for allergies.     Magnesium Oxide 400 MG CAPS Take 1 capsule (400 mg total) by mouth daily. 90 capsule 3   metFORMIN (GLUCOPHAGE-XR) 500 MG 24 hr tablet TAKE 3 TABLETS(1500 MG) BY MOUTH DAILY WITH BREAKFAST 270 tablet 3   Multiple Vitamin (MULTIVITAMIN) tablet Take 1 tablet by mouth daily.     omeprazole (PRILOSEC) 40 MG capsule TAKE 1 CAPSULE(40 MG) BY MOUTH DAILY 90 capsule 3   ONETOUCH ULTRA test strip CHECK GLUCOSE TWICE DAILY AND AS NEEDED 100 strip 1   potassium chloride SA (KLOR-CON M) 20 MEQ tablet TAKE 2 TABLETS BY MOUTH TWICE DAILY 360 tablet 0   pravastatin (PRAVACHOL) 10 MG tablet Take 1 tablet (10 mg total) by mouth every other day.     rivaroxaban (XARELTO) 20 MG TABS tablet TAKE 1 TABLET(20 MG) BY MOUTH DAILY WITH SUPPER 30 tablet 5   spironolactone (ALDACTONE) 25 MG tablet TAKE 1 TABLET(25 MG) BY MOUTH DAILY 90 tablet 1   traMADol (ULTRAM) 50 MG tablet TAKE 1 TABLET(50 MG) BY MOUTH EVERY 12 HOURS AS NEEDED FOR PAIN 60 tablet 2     Clobetasol Prop Emollient Base (CLOBETASOL PROPIONATE E) 0.05 % emollient cream Apply 1 Application topically 2 (two) times daily. (Patient not taking: Reported on 01/26/2022) 30 g 0   No current facility-administered medications on file prior to visit.    Allergies  Allergen Reactions   Atorvastatin     Muscle pain     Past Medical History:  Diagnosis Date   AICD (automatic cardioverter/defibrillator) present    Anxiety state, unspecified    Calculus of kidney 1981   "passed it on my own"   Chronic systolic CHF (congestive heart failure) (HCC)    Dermatophytosis of the body    DJD (degenerative joint disease)    Esophageal reflux    Hx of colonic polyps    Hypertension    LBBB (left bundle branch block)    intermittent   Non-ischemic cardiomyopathy (HCC)    a. cath 2013: minor  nonobstructive CAD.   OSA (obstructive sleep apnea)    he can't tolerate CPAP.    Osteoarthrosis, unspecified whether generalized or localized, hand    Other chronic nonalcoholic liver disease    fatty liver   PAF (paroxysmal atrial fibrillation) (HCC)    a. s/p DCCV 6/11; previously on Pradaxa;  b. Event Monitor 2012->No PAF;  c. 09/2011 s/p DCCV ->Xarelto started. d. 03/2014: inappropriate ICD shocks for AF-RVR, started on Tikosyn, Xarelto restarted.   Type II diabetes mellitus (HCC)    Unspecified hearing loss    no hearing aid    Past Surgical History:  Procedure Laterality Date   Abdominal US  01/2007   Fatty liver, no gallstones   APPENDECTOMY     ATRIAL TACH ABLATION  09/2015   BI-VENTRICULAR IMPLANTABLE CARDIOVERTER DEFIBRILLATOR [name] 02/14/2013   STJ CRTD implanted by Dr Klein   BILATERAL KNEE ARTHROSCOPY     BIV ICD GENERATOR CHANGEOUT [name] 06/06/2019   Procedure: BIV ICD GENERATOR CHANGEOUT;  Surgeon: Klein, Steven C, MD;  Location: [facility]IVE [facility];  Service: Cardiovascular;  Laterality: [name];   CARDIOVERSION  09/29/2011   Procedure: CARDIOVERSION;  Surgeon: Philip J Nahser, MD;  Location: [facility] OR;  Service: Cardiovascular;  Laterality: [name];   COLONOSCOPY WITH PROPOFOL [name] 10/26/2016   Procedure: COLONOSCOPY WITH PROPOFOL;  Surgeon: Perry, John N, MD;  Location: [facility];  Service: Endoscopy;  Laterality: [name];   DOPPLER ECHOCARDIOGRAPHY  11/2009   Decreased EF of 35-40%   ELECTROPHYSIOLOGIC STUDY [name] 10/14/2015   Procedure: Atrial Fibrillation Ablation;  Surgeon: James Allred, MD;  Location: [facility];  Service: Cardiovascular;  Laterality: [name];   LEFT HEART CATHETERIZATION WITH CORONARY ANGIOGRAM [name] 08/18/2011   Procedure: LEFT HEART CATHETERIZATION WITH CORONARY ANGIOGRAM;  Surgeon: Peter M Jordan, MD;  Location: [facility] CATH [facility];  Service: Cardiovascular;  Laterality: [name];   NECK SURGERY     plate/fusion   Stress Cardiolite  08/2005   Normal, EF 42%   UPPER  GASTROINTESTINAL ENDOSCOPY  08/2006   GERD    Family History  Problem Relation Age of Onset   Hypertension Mother    Diabetes Mother    Heart failure Mother        Died @ 90   Hypertension Father    Heart failure Father        Died @ 79   Prostate cancer Father    Other Sister    Multiple myeloma Sister    Leukemia Sister    Colon polyps Sister    Diabetes Brother    Kidney disease Brother      Diabetes Brother    Prostate cancer Brother    Diabetes Brother    Cirrhosis Maternal Uncle        alcohol related   Stroke Paternal Grandmother    Colon cancer Paternal Grandmother    Heart attack Paternal Grandfather    Esophageal cancer Neg Hx    Stomach cancer Neg Hx    Pancreatic cancer Neg Hx     Social History   Socioeconomic History   Marital status: Married    Spouse name: Not on file   Number of children: 2   Years of education: Not on file   Highest education level: Not on file  Occupational History   Occupation: PAINTER    Employer: UNEMPLOYED  Tobacco Use   Smoking status: Never    Passive exposure: Past   Smokeless tobacco: Former    Types: Snuff    Quit date: 10/12/2004   Tobacco comments:    02/14/2013 "quit snuff in 2006"  Vaping Use   Vaping Use: Never used  Substance and Sexual Activity   Alcohol use: No    Alcohol/week: 0.0 standard drinks of alcohol   Drug use: No   Sexual activity: Yes  Other Topics Concern   Not on file  Social History Narrative   Lives in [place] with wife.  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   Married 1990   Social Determinants of Health   Financial Resource Strain: Low Risk  (07/06/2021)   Overall Financial Resource Strain (CARDIA)    Difficulty of Paying Living Expenses: Not very hard  Food Insecurity: No Food Insecurity (07/06/2021)   Hunger Vital Sign    Worried About Running Out of Food in the Last Year: Never true    Ran Out of Food in the Last Year: Never true  Transportation Needs: No  Transportation Needs (07/06/2021)   PRAPARE - Transportation    Lack of Transportation (Medical): No    Lack of Transportation (Non-Medical): No  Physical Activity: Sufficiently Active (07/06/2021)   Exercise Vital Sign    Days of Exercise per Week: 7 days    Minutes of Exercise per Session: 30 min  Stress: No Stress Concern Present (07/06/2021)   [employer] - Occupational Stress Questionnaire    Feeling of Stress : Not at all  Social Connections: Moderately Integrated (07/06/2021)   Social Connection and Isolation Panel [NHANES]    Frequency of Communication with Friends and Family: More than three times a week    Frequency of Social Gatherings with Friends and Family: Three times a week    Attends Religious Services: More than 4 times per year    Active Member of Clubs or Organizations: No    Attends Club or Organization Meetings: Never    Marital Status: Married  Intimate Partner Violence: Not At Risk (07/06/2021)   Humiliation, Afraid, Rape, and Kick questionnaire    Fear of Current or Ex-Partner: No    Emotionally Abused: No    Physically Abused: No    Sexually Abused: No   Review of Systems No change in smell or taste No N/V Eating okay Prone to sinus problems Trouble sleeping due to cough     Objective:   Physical Exam Constitutional:      Appearance: Normal appearance.  HENT:     Head:     Comments: Mild frontal tenderness    Right Ear: Tympanic membrane and ear canal normal.     Left Ear: Tympanic membrane and   ear canal normal.     Nose: Congestion present.     Mouth/Throat:     Pharynx: No oropharyngeal exudate or posterior oropharyngeal erythema.  Pulmonary:     Effort: Pulmonary effort is normal.     Breath sounds: Normal breath sounds. No wheezing or rales.  Musculoskeletal:     Cervical back: Neck supple.  Lymphadenopathy:     Cervical: No cervical adenopathy.  Neurological:     Mental Status: He is alert.             Assessment & Plan:   

## 2022-02-03 ENCOUNTER — Other Ambulatory Visit: Payer: Self-pay | Admitting: Internal Medicine

## 2022-02-04 ENCOUNTER — Telehealth (HOSPITAL_COMMUNITY): Payer: Self-pay | Admitting: Pharmacy Technician

## 2022-02-04 ENCOUNTER — Telehealth: Payer: HMO

## 2022-02-04 NOTE — Telephone Encounter (Signed)
Advanced Heart Failure Patient Advocate Encounter  The patient was approved for a [employer] grant that will help cover the cost of Entresto and Jardiance. Total amount awarded, $10,000. Eligibility, 03/08/22 - 03/08/23.  ID [id number]  BIN [id number]  PCN [id number]  Group [id number]

## 2022-02-10 NOTE — Telephone Encounter (Signed)
Advanced Heart Failure Patient Advocate Encounter  Spoke with patient. Emailed copy of grant information.  Charlann Boxer, CPhT

## 2022-02-17 ENCOUNTER — Encounter: Payer: Self-pay | Admitting: Internal Medicine

## 2022-02-17 ENCOUNTER — Ambulatory Visit (INDEPENDENT_AMBULATORY_CARE_PROVIDER_SITE_OTHER): Payer: PPO | Admitting: Internal Medicine

## 2022-02-17 VITALS — BP 124/78 | HR 62 | Temp 97.9°F | Ht 76.0 in | Wt 287.0 lb

## 2022-02-17 DIAGNOSIS — J0110 Acute frontal sinusitis, unspecified: Secondary | ICD-10-CM

## 2022-02-17 MED ORDER — AMOXICILLIN-POT CLAVULANATE 875-125 MG PO TABS: 1.0000 | ORAL_TABLET | Freq: Two times a day (BID) | ORAL | 1 refills | Status: DC

## 2022-02-17 NOTE — Assessment & Plan Note (Signed)
Did respond to augmentin but recurred No signs of pneumonia Reassured--doesn't have CHF exacerbation  Discussed humidifier and starting flonase again (along with nasal saline) Will give augmentin again for 10 days with a refill--just in case

## 2022-02-17 NOTE — Progress Notes (Signed)
Subjective:    Patient ID: Aaron Thompson, male    DOB: [birth year], 61 y.o.   MRN: [id number]  HPI Here due to recurrent sinus symptoms  Seen 3 weeks ago Did take the antibiotic---just about back to normal within 4 days 3 days after done--started cough again Wondered if it was the heart failure  Coughs up yellow stuff---mostly in evening and night More cough after eating/supper  Using coricidin during the day and mucinex---symptoms not bad during the day No fever Did have some SOB---slight mid chest pain and SOB after exertion. Seemed to improve after coughing stuff out Does take loratadine in AM---zyrtec prn in evening  Current Outpatient Medications on File Prior to Visit  Medication Sig Dispense Refill   acetaminophen (TYLENOL) 325 MG tablet Take 650 mg by mouth every 6 (six) hours as needed for mild pain or moderate pain.     ALPRAZolam (XANAX) 0.25 MG tablet TAKE 1 TABLET BY MOUTH DAILY AS NEEDED FOR ANXIETY 30 tablet 2   amoxicillin-clavulanate (AUGMENTIN) 875-125 MG tablet Take 1 tablet by mouth 2 (two) times daily. 14 tablet 0   Blood Glucose Monitoring Suppl (ONE TOUCH ULTRA 2) w/Device KIT Use daily to check sugars as needed. Dx E11.9 1 kit 0   carvedilol (COREG) 25 MG tablet TAKE 2 TABLETS(50 MG) BY MOUTH TWICE DAILY WITH A MEAL 360 tablet 3   Clobetasol Prop Emollient Base (CLOBETASOL PROPIONATE E) 0.05 % emollient cream Apply 1 Application topically 2 (two) times daily. 30 g 0   diltiazem (CARDIZEM) 30 MG tablet TAKE 1 TABLET BY MOUTH AS NEEDED, MAY REPEAT DOSE IN 4 HOURS ONCE IF NEEDED 90 tablet 0   dofetilide (TIKOSYN) 500 MCG capsule TAKE 1 CAPSULE(500 MCG) BY MOUTH TWICE DAILY 180 capsule 1   ENTRESTO 97-103 MG TAKE 1 TABLET BY MOUTH TWICE DAILY 180 tablet 2   furosemide (LASIX) 40 MG tablet TAKE 1 TABLET BY MOUTH EVERY FRIDAY 4 tablet 0   glipiZIDE (GLUCOTROL XL) 10 MG 24 hr tablet TAKE 1 TABLET(10 MG) BY MOUTH DAILY WITH BREAKFAST 30 tablet 12   JARDIANCE 25 MG  TABS tablet TAKE 1 TABLET BY MOUTH EVERY DAY 90 tablet 1   loratadine (CLARITIN) 10 MG tablet Take 10 mg by mouth daily as needed for allergies.     Magnesium Oxide 400 MG CAPS Take 1 capsule (400 mg total) by mouth daily. 90 capsule 3   metFORMIN (GLUCOPHAGE-XR) 500 MG 24 hr tablet TAKE 3 TABLETS(1500 MG) BY MOUTH DAILY WITH BREAKFAST 270 tablet 3   Multiple Vitamin (MULTIVITAMIN) tablet Take 1 tablet by mouth daily.     omeprazole (PRILOSEC) 40 MG capsule TAKE 1 CAPSULE(40 MG) BY MOUTH DAILY 90 capsule 3   ONETOUCH ULTRA test strip CHECK GLUCOSE TWICE DAILY AND AS NEEDED 100 strip 1   potassium chloride SA (KLOR-CON M) 20 MEQ tablet TAKE 2 TABLETS BY MOUTH TWICE DAILY 360 tablet 0   pravastatin (PRAVACHOL) 10 MG tablet Take 1 tablet (10 mg total) by mouth every other day.     rivaroxaban (XARELTO) 20 MG TABS tablet TAKE 1 TABLET(20 MG) BY MOUTH DAILY WITH SUPPER 30 tablet 5   spironolactone (ALDACTONE) 25 MG tablet TAKE 1 TABLET(25 MG) BY MOUTH DAILY 90 tablet 1   traMADol (ULTRAM) 50 MG tablet TAKE 1 TABLET(50 MG) BY MOUTH EVERY 12 HOURS AS NEEDED FOR PAIN 60 tablet 2   HYDROcodone bit-homatropine (HYCODAN) 5-1.5 MG/5ML syrup Take 5 mLs by mouth at bedtime as needed  for cough. (Patient not taking: Reported on 02/17/2022) 60 mL 0   No current facility-administered medications on file prior to visit.    Allergies  Allergen Reactions   Atorvastatin     Muscle pain     Past Medical History:  Diagnosis Date   AICD (automatic cardioverter/defibrillator) present    Anxiety state, unspecified    Calculus of kidney 1981   "passed it on my own"   Chronic systolic CHF (congestive heart failure) ([place])    Dermatophytosis of the body    DJD (degenerative joint disease)    Esophageal reflux    Hx of colonic polyps    Hypertension    LBBB (left bundle branch block)    intermittent   Non-ischemic cardiomyopathy ([place])    a. cath 2013: minor nonobstructive CAD.   OSA (obstructive sleep apnea)     he can't tolerate CPAP.    Osteoarthrosis, unspecified whether generalized or localized, hand    Other chronic nonalcoholic liver disease    fatty liver   PAF (paroxysmal atrial fibrillation) ([place])    a. s/p DCCV 6/11; previously on Pradaxa;  b. Event Monitor 2012->No PAF;  c. 09/2011 s/p DCCV ->Xarelto started. d. 03/2014: inappropriate ICD shocks for AF-RVR, started on Tikosyn, Xarelto restarted.   Type II diabetes mellitus ([place])    Unspecified hearing loss    no hearing aid    Past Surgical History:  Procedure Laterality Date   Abdominal US  01/2007   Fatty liver, no gallstones   APPENDECTOMY     ATRIAL TACH ABLATION  09/2015   BI-VENTRICULAR IMPLANTABLE CARDIOVERTER DEFIBRILLATOR [name] 02/14/2013   STJ CRTD implanted by Dr Caryl Comes   BILATERAL KNEE ARTHROSCOPY     BIV ICD GENERATOR CHANGEOUT [name] 06/06/2019   Procedure: BIV ICD GENERATOR CHANGEOUT;  Surgeon: Deboraha Sprang, MD;  Location: [facility];  Service: Cardiovascular;  Laterality: [name];   CARDIOVERSION  09/29/2011   Procedure: CARDIOVERSION;  Surgeon: Thayer Headings, MD;  Location: [facility];  Service: Cardiovascular;  Laterality: [name];   COLONOSCOPY WITH PROPOFOL [name] 10/26/2016   Procedure: COLONOSCOPY WITH PROPOFOL;  Surgeon: Irene Shipper, MD;  Location: [facility];  Service: Endoscopy;  Laterality: [name];   DOPPLER ECHOCARDIOGRAPHY  11/2009   Decreased EF of 35-40%   ELECTROPHYSIOLOGIC STUDY [name] 10/14/2015   Procedure: Atrial Fibrillation Ablation;  Surgeon: [name], MD;  Location: [facility];  Service: Cardiovascular;  Laterality: [name];   LEFT HEART CATHETERIZATION WITH CORONARY ANGIOGRAM [name] 08/18/2011   Procedure: LEFT HEART CATHETERIZATION WITH CORONARY ANGIOGRAM;  Surgeon: Peter M Martinique, MD;  Location: [facility];  Service: Cardiovascular;  Laterality: [name];   NECK SURGERY     plate/fusion   Stress Cardiolite  08/2005   Normal, EF 42%   UPPER GASTROINTESTINAL ENDOSCOPY  08/2006   GERD    Family  History  Problem Relation Age of Onset   Hypertension Mother    Diabetes Mother    Heart failure Mother        Died @ 26   Hypertension Father    Heart failure Father        Died @ 58   Prostate cancer Father    Other Sister    Multiple myeloma Sister    Leukemia Sister    Colon polyps Sister    Diabetes Brother    Kidney disease Brother    Diabetes Brother    Prostate cancer Brother    Diabetes Brother  Cirrhosis Maternal Uncle        alcohol related   Stroke Paternal Grandmother    Colon cancer Paternal Grandmother    Heart attack Paternal Grandfather    Esophageal cancer Neg Hx    Stomach cancer Neg Hx    Pancreatic cancer Neg Hx     Social History   Socioeconomic History   Marital status: Married    Spouse name: Not on file   Number of children: 2   Years of education: Not on file   Highest education level: Not on file  Occupational History   Occupation: Banker: UNEMPLOYED  Tobacco Use   Smoking status: Never    Passive exposure: Past   Smokeless tobacco: Former    Types: Snuff    Quit date: 10/12/2004   Tobacco comments:    02/14/2013 "quit snuff in 2006"  Vaping Use   Vaping Use: Never used  Substance and Sexual Activity   Alcohol use: No    Alcohol/week: 0.0 standard drinks of alcohol   Drug use: No   Sexual activity: Yes  Other Topics Concern   Not on file  Social History Narrative   Lives in [place] with wife.  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   Married 1990   Social Determinants of Health   Financial Resource Strain: Low Risk  (07/06/2021)   Overall Financial Resource Strain (CARDIA)    Difficulty of Paying Living Expenses: Not very hard  Food Insecurity: No Food Insecurity (07/06/2021)   Hunger Vital Sign    Worried About Running Out of Food in the Last Year: Never true    Ran Out of Food in the Last Year: Never true  Transportation Needs: No Transportation Needs (07/06/2021)   PRAPARE - Armed forces logistics/support/administrative officer (Medical): No    Lack of Transportation (Non-Medical): No  Physical Activity: Sufficiently Active (07/06/2021)   Exercise Vital Sign    Days of Exercise per Week: 7 days    Minutes of Exercise per Session: 30 min  Stress: No Stress Concern Present (07/06/2021)   [place]    Feeling of Stress : Not at all  Social Connections: Moderately Integrated (07/06/2021)   Social Connection and Isolation Panel [NHANES]    Frequency of Communication with Friends and Family: More than three times a week    Frequency of Social Gatherings with Friends and Family: Three times a week    Attends Religious Services: More than 4 times per year    Active Member of Clubs or Organizations: No    Attends Archivist Meetings: Never    Marital Status: Married  Human resources officer Violence: Not At Risk (07/06/2021)   Humiliation, Afraid, Rape, and Kick questionnaire    Fear of Current or Ex-Partner: No    Emotionally Abused: No    Physically Abused: No    Sexually Abused: No   Review of Systems Weighs many days---hasn't really changed No edema    Objective:   Physical Exam Constitutional:      Appearance: Normal appearance.  HENT:     Head:     Comments: Slight maxillary tenderness    Nose: Congestion present.     Mouth/Throat:     Pharynx: No oropharyngeal exudate or posterior oropharyngeal erythema.  Cardiovascular:     Rate and Rhythm: Normal rate and regular rhythm.     Heart sounds: No murmur heard.  No gallop.  Pulmonary:     Effort: Pulmonary effort is normal.     Breath sounds: Normal breath sounds. No wheezing or rales.  Musculoskeletal:     Right lower leg: No edema.     Left lower leg: No edema.  Neurological:     Mental Status: He is alert.            Assessment & Plan:

## 2022-02-27 ENCOUNTER — Other Ambulatory Visit: Payer: Self-pay | Admitting: Internal Medicine

## 2022-03-03 ENCOUNTER — Ambulatory Visit (INDEPENDENT_AMBULATORY_CARE_PROVIDER_SITE_OTHER): Payer: PPO

## 2022-03-03 DIAGNOSIS — I428 Other cardiomyopathies: Secondary | ICD-10-CM | POA: Diagnosis not present

## 2022-03-03 LAB — CUP PACEART REMOTE DEVICE CHECK
Battery Remaining Longevity: 50 mo
Battery Remaining Percentage: 57 %
Battery Voltage: 2.96 V
Brady Statistic AP VP Percent: 1.6 %
Brady Statistic AP VS Percent: 1 %
Brady Statistic AS VP Percent: 93 %
Brady Statistic AS VS Percent: 2.8 %
Brady Statistic RA Percent Paced: 1 %
Date Time Interrogation Session: 20240207020017
HighPow Impedance: 80 Ohm
HighPow Impedance: 80 Ohm
Implantable Lead Connection Status: 753985
Implantable Lead Connection Status: 753985
Implantable Lead Connection Status: 753985
Implantable Lead Implant Date: 20150121
Implantable Lead Implant Date: 20150121
Implantable Lead Implant Date: 20150121
Implantable Lead Location: 753858
Implantable Lead Location: 753859
Implantable Lead Location: 753860
Implantable Lead Model: 7122
Implantable Pulse Generator Implant Date: 20210512
Lead Channel Impedance Value: 450 Ohm
Lead Channel Impedance Value: 480 Ohm
Lead Channel Impedance Value: 540 Ohm
Lead Channel Pacing Threshold Amplitude: 0.75 V
Lead Channel Pacing Threshold Amplitude: 1.375 V
Lead Channel Pacing Threshold Amplitude: 1.5 V
Lead Channel Pacing Threshold Pulse Width: 0.4 ms
Lead Channel Pacing Threshold Pulse Width: 0.4 ms
Lead Channel Pacing Threshold Pulse Width: 0.5 ms
Lead Channel Sensing Intrinsic Amplitude: 11.7 mV
Lead Channel Sensing Intrinsic Amplitude: 5 mV
Lead Channel Setting Pacing Amplitude: 1.75 V
Lead Channel Setting Pacing Amplitude: 2.375
Lead Channel Setting Pacing Amplitude: 2.5 V
Lead Channel Setting Pacing Pulse Width: 0.4 ms
Lead Channel Setting Pacing Pulse Width: 0.5 ms
Lead Channel Setting Sensing Sensitivity: 0.5 mV
Pulse Gen Serial Number: 9890691

## 2022-03-04 NOTE — Progress Notes (Signed)
Primary Cardiologist: Dr. Johnsie Thompson  HPI:  Aaron Thompson is a 62 y.o. male with PAF s/p AF ablation , HTN, morbid obesity, OSA unable to tolerate CPAP, DM, LBBB and chronic systolic HF (EF 0000000) due to NICM s/p STJ CRT-D (cath 2013 minimal CAD)    We have not seen him since 12/19. He presents for f/u.   Diagnosed with systolic HF in 0000000. Cath at that time minimal CAD. Had STJ CRT-D placed.   Presented to ER with new-onset AF 7/17.Had DCCV in ER.  Admitted the same month with ERAF. Felt to to have both AFib and ATach.  Subsequently seen by Dr. Rayann Thompson and had afib ablation in 9/17. Subsequently had recurrent AF with RVR leading to ICD shocks. Placed back on tikosyn and anticoagulation.   Echo  04/21/20 EF 25-30% mild RV reduction trivial MR moderate LAE  CRT-D gen change 5/21  Saw Dr. Caryl Thompson in 1/22 and being evaluated for symptomatic PVCs. 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 Aaron Thompson finally got a new phone !!!  Still very much bothered by Arrhythmia Last month had multiple episodes of rapid palpitations And one caused pre syncope He is taking MgOxide as well as his meds     Past Medical History:  Diagnosis Date   AICD (automatic cardioverter/defibrillator) present    Anxiety state, unspecified    Calculus of kidney 1981   "passed it on my own"   Chronic systolic CHF (congestive heart failure) ([place])    Dermatophytosis of the body    DJD (degenerative joint disease)    Esophageal reflux    Hx of colonic polyps    Hypertension    LBBB (left bundle branch block)    intermittent   Non-ischemic cardiomyopathy ([place])    a. cath 2013: minor nonobstructive CAD.   OSA (obstructive sleep apnea)    he can't tolerate CPAP.    Osteoarthrosis, unspecified whether generalized or localized, hand    Other chronic  nonalcoholic liver disease    fatty liver   PAF (paroxysmal atrial fibrillation) ([place])    a. s/p DCCV 6/11; previously on Pradaxa;  b. Event Monitor 2012->No PAF;  c. 09/2011 s/p DCCV ->Xarelto started. d. 03/2014: inappropriate ICD shocks for AF-RVR, started on Tikosyn, Xarelto restarted.   Type II diabetes mellitus ([facility])    Unspecified hearing loss    no hearing aid    Current Outpatient Medications  Medication Sig Dispense Refill   acetaminophen (TYLENOL) 325 MG tablet Take 650 mg by mouth every 6 (six) hours as needed for mild pain or moderate pain.     ALPRAZolam (XANAX) 0.25 MG tablet TAKE 1 TABLET BY MOUTH DAILY AS NEEDED FOR ANXIETY 30 tablet 2   Blood Glucose Monitoring Suppl (ONE TOUCH ULTRA 2) w/Device KIT Use daily to check sugars as needed. Dx E11.9 1 kit 0   carvedilol (COREG) 25 MG tablet TAKE 2 TABLETS(50 MG) BY MOUTH TWICE DAILY WITH A MEAL 360 tablet 3   diltiazem (CARDIZEM) 30 MG tablet TAKE 1 TABLET BY MOUTH AS NEEDED, MAY REPEAT DOSE IN 4 HOURS ONCE IF NEEDED 90 tablet 0   dofetilide (TIKOSYN) 500 MCG capsule TAKE 1 CAPSULE(500 MCG) BY MOUTH TWICE DAILY 180 capsule 1   ENTRESTO 97-103 MG TAKE 1 TABLET BY  MOUTH TWICE DAILY 180 tablet 2   furosemide (LASIX) 40 MG tablet TAKE 1 TABLET BY MOUTH EVERY FRIDAY 4 tablet 0   glipiZIDE (GLUCOTROL XL) 10 MG 24 hr tablet TAKE 1 TABLET(10 MG) BY MOUTH DAILY WITH BREAKFAST 30 tablet 12   JARDIANCE 25 MG TABS tablet TAKE 1 TABLET BY MOUTH EVERY DAY 90 tablet 1   loratadine (CLARITIN) 10 MG tablet Take 10 mg by mouth daily as needed for allergies.     Magnesium Oxide 400 MG CAPS Take 1 capsule (400 mg total) by mouth daily. 90 capsule 3   metFORMIN (GLUCOPHAGE-XR) 500 MG 24 hr tablet TAKE 3 TABLETS(1500 MG) BY MOUTH DAILY WITH BREAKFAST 270 tablet 3   Multiple Vitamin (MULTIVITAMIN) tablet Take 1 tablet by mouth daily.     omeprazole (PRILOSEC) 40 MG capsule TAKE 1 CAPSULE(40 MG) BY MOUTH DAILY 90 capsule 3   ONETOUCH ULTRA test strip  CHECK GLUCOSE TWICE DAILY AND AS NEEDED 100 strip 1   potassium chloride SA (KLOR-CON M) 20 MEQ tablet TAKE 2 TABLETS BY MOUTH TWICE DAILY 360 tablet 0   pravastatin (PRAVACHOL) 10 MG tablet Take 1 tablet (10 mg total) by mouth every other day.     rivaroxaban (XARELTO) 20 MG TABS tablet TAKE 1 TABLET(20 MG) BY MOUTH DAILY WITH SUPPER 30 tablet 5   spironolactone (ALDACTONE) 25 MG tablet TAKE 1 TABLET(25 MG) BY MOUTH DAILY 90 tablet 1   traMADol (ULTRAM) 50 MG tablet TAKE 1 TABLET(50 MG) BY MOUTH EVERY 12 HOURS AS NEEDED FOR PAIN 60 tablet 2   HYDROcodone bit-homatropine (HYCODAN) 5-1.5 MG/5ML syrup Take 5 mLs by mouth at bedtime as needed for cough. 60 mL 0   No current facility-administered medications for this visit.    Allergies  Allergen Reactions   Atorvastatin     Muscle pain       Social History   Socioeconomic History   Marital status: Married    Spouse name: Not on file   Number of children: 2   Years of education: Not on file   Highest education level: Not on file  Occupational History   Occupation: PAINTER    Employer: UNEMPLOYED  Tobacco Use   Smoking status: Never    Passive exposure: Past   Smokeless tobacco: Former    Types: Snuff    Quit date: 10/12/2004   Tobacco comments:    02/14/2013 "quit snuff in 2006"  Vaping Use   Vaping Use: Never used  Substance and Sexual Activity   Alcohol use: No    Alcohol/week: 0.0 standard drinks of alcohol   Drug use: No   Sexual activity: Yes  Other Topics Concern   Not on file  Social History Narrative   Lives in [place] with wife.  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   Married 1990   Social Determinants of Health   Financial Resource Strain: Low Risk  (07/06/2021)   Overall Financial Resource Strain (CARDIA)    Difficulty of Paying Living Expenses: Not very hard  Food Insecurity: No Food Insecurity (07/06/2021)   Hunger Vital Sign    Worried About Running Out of Food in the Last Year: Never true     Ran Out of Food in the Last Year: Never true  Transportation Needs: No Transportation Needs (07/06/2021)   PRAPARE - Hydrologist (Medical): No    Lack of Transportation (Non-Medical): No  Physical Activity: Sufficiently Active (07/06/2021)   Exercise Vital Sign  Days of Exercise per Week: 7 days    Minutes of Exercise per Session: 30 min  Stress: No Stress Concern Present (07/06/2021)   [place]    Feeling of Stress : Not at all  Social Connections: Moderately Integrated (07/06/2021)   Social Connection and Isolation Panel [NHANES]    Frequency of Communication with Friends and Family: More than three times a week    Frequency of Social Gatherings with Friends and Family: Three times a week    Attends Religious Services: More than 4 times per year    Active Member of Clubs or Organizations: No    Attends Archivist Meetings: Never    Marital Status: Married  Human resources officer Violence: Not At Risk (07/06/2021)   Humiliation, Afraid, Rape, and Kick questionnaire    Fear of Current or Ex-Partner: No    Emotionally Abused: No    Physically Abused: No    Sexually Abused: No      Family History  Problem Relation Age of Onset   Hypertension Mother    Diabetes Mother    Heart failure Mother        Died @ 40   Hypertension Father    Heart failure Father        Died @ 71   Prostate cancer Father    Other Sister    Multiple myeloma Sister    Leukemia Sister    Colon polyps Sister    Diabetes Brother    Kidney disease Brother    Diabetes Brother    Prostate cancer Brother    Diabetes Brother    Cirrhosis Maternal Uncle        alcohol related   Stroke Paternal Grandmother    Colon cancer Paternal Grandmother    Heart attack Paternal Grandfather    Esophageal cancer Neg Hx    Stomach cancer Neg Hx    Pancreatic cancer Neg Hx     Vitals:   03/08/22 0927  BP: 116/70   Pulse: 86  SpO2: 94%  Weight: 285 lb 4 oz (129.4 kg)  Height: 6' 4"$  (1.93 m)      PHYSICAL EXAM:  Large white male AICD under left clavicle JVP normal PMI enlarged no murmur Abdomen benign  Lungs clear No edema   ECG: NSR with v-pacing 80 QTS 528 ms (QRS is 132 so JT is 396)Personally reviewed  ASSESSMENT & PLAN: 1. Chronic systolic failure due to NICM s/p STJ CRT-D - cath 2013 non-obstructive CAD - EF 25-30% stable NYHA class 2  - Continue lasix 40 + kcl 20 on every Friday - ICD interrogated as above -  Entresto 97/103 bid, carvedilol 25 bid, spiro 25 daily. + Jardiance 44m daily  - If symptoms worsen can consider Barostim    2. PAF - s/p AF ablation - maintaining NSR on Tikosyn. [facility]- Followed by Dr. KCaryl Thompson- QT 472 - No bleeding on Xarelto   3. OSA - unable to tolerate CPAP - weight loss recommended.  - Discussed Inspiris device   4. DM2  - Continue Jardiance 25 - continue statin and Entresto  5. PVCs - ICD interrogation showed continued issues with ventricular arrhythmias He has had ablations and on Tikosyn  SK indicates continue current meds and Mg Oxide    F/U EP Dr KCaryl Comes6 months and me in a year   PJenkins Rouge MD  9:39 AM    .

## 2022-03-06 ENCOUNTER — Other Ambulatory Visit: Payer: Self-pay | Admitting: Family Medicine

## 2022-03-08 ENCOUNTER — Ambulatory Visit: Payer: PPO | Attending: Cardiovascular Disease | Admitting: Cardiovascular Disease

## 2022-03-08 ENCOUNTER — Encounter: Payer: Self-pay | Admitting: Cardiovascular Disease

## 2022-03-08 ENCOUNTER — Ambulatory Visit: Payer: PPO | Admitting: Family Medicine

## 2022-03-08 VITALS — BP 116/70 | HR 86 | Ht 76.0 in | Wt 285.2 lb

## 2022-03-08 DIAGNOSIS — I480 Paroxysmal atrial fibrillation: Secondary | ICD-10-CM | POA: Diagnosis not present

## 2022-03-08 DIAGNOSIS — I428 Other cardiomyopathies: Secondary | ICD-10-CM | POA: Diagnosis not present

## 2022-03-08 DIAGNOSIS — Z95810 Presence of automatic (implantable) cardiac defibrillator: Secondary | ICD-10-CM

## 2022-03-08 DIAGNOSIS — I5022 Chronic systolic (congestive) heart failure: Secondary | ICD-10-CM

## 2022-03-08 NOTE — Telephone Encounter (Signed)
Refill request for  TRAMADOL 50MG TABLETS   LOV - 02/17/22 Next OV - not scheduled Last refill - 12/02/21 #60/2

## 2022-03-08 NOTE — Patient Instructions (Addendum)
Medication Instructions:  Your physician recommends that you continue on your current medications as directed. Please refer to the Current Medication list given to you today.  *If you need a refill on your cardiac medications before your next appointment, please call your pharmacy*  Lab Work: If you have labs (blood work) drawn today and your tests are completely normal, you will receive your results only by: MyChart Message (if you have MyChart) OR A paper copy in the mail If you have any lab test that is abnormal or we need to change your treatment, we will call you to review the results.  Testing/Procedures: None ordered today.  Follow-Up: At [facility], you and your health needs are our priority.  As part of our continuing mission to provide you with exceptional heart care, we have created designated Provider Care Teams.  These Care Teams include your primary Cardiologist (physician) and Advanced Practice Providers (APPs -  Physician Assistants and Nurse Practitioners) who all work together to provide you with the care you need, when you need it.  We recommend signing up for the patient portal called "MyChart".  Sign up information is provided on this After Visit Summary.  MyChart is used to connect with patients for Virtual Visits (Telemedicine).  Patients are able to view lab/test results, encounter notes, upcoming appointments, etc.  Non-urgent messages can be sent to your provider as well.   To learn more about what you can do with MyChart, go to [email or web address].    Your next appointment:   6 month(s)  Provider:   Peter Nishan, MD      

## 2022-03-23 ENCOUNTER — Other Ambulatory Visit: Payer: Self-pay | Admitting: Cardiovascular Disease

## 2022-03-30 DIAGNOSIS — M25571 Pain in right ankle and joints of right foot: Secondary | ICD-10-CM | POA: Diagnosis not present

## 2022-03-30 DIAGNOSIS — M25572 Pain in left ankle and joints of left foot: Secondary | ICD-10-CM | POA: Diagnosis not present

## 2022-03-30 NOTE — Progress Notes (Signed)
Remote ICD transmission.   

## 2022-03-31 DIAGNOSIS — M7672 Peroneal tendinitis, left leg: Secondary | ICD-10-CM | POA: Diagnosis not present

## 2022-03-31 DIAGNOSIS — M7671 Peroneal tendinitis, right leg: Secondary | ICD-10-CM | POA: Diagnosis not present

## 2022-04-04 ENCOUNTER — Encounter: Payer: Self-pay | Admitting: Cardiovascular Disease

## 2022-04-04 ENCOUNTER — Other Ambulatory Visit: Payer: Self-pay | Admitting: Internal Medicine

## 2022-04-05 MED ORDER — FUROSEMIDE 40 MG PO TABS: ORAL_TABLET | ORAL | 3 refills | Status: DC

## 2022-04-06 ENCOUNTER — Other Ambulatory Visit: Payer: Self-pay | Admitting: Family Medicine

## 2022-04-06 NOTE — Telephone Encounter (Signed)
Refill request for ALPRAZolam (XANAX) 0.25 MG table   LOV - 02/17/22 Next OV - not scheduled Last refill - 01/06/22 #30/2

## 2022-04-22 ENCOUNTER — Other Ambulatory Visit: Payer: Self-pay | Admitting: Family Medicine

## 2022-04-26 DIAGNOSIS — M7671 Peroneal tendinitis, right leg: Secondary | ICD-10-CM | POA: Diagnosis not present

## 2022-04-26 DIAGNOSIS — M7672 Peroneal tendinitis, left leg: Secondary | ICD-10-CM | POA: Diagnosis not present

## 2022-05-14 DIAGNOSIS — H1031 Unspecified acute conjunctivitis, right eye: Secondary | ICD-10-CM | POA: Diagnosis not present

## 2022-05-25 ENCOUNTER — Other Ambulatory Visit: Payer: Self-pay | Admitting: Family Medicine

## 2022-05-25 NOTE — Telephone Encounter (Signed)
Patient is due for his medicare wellness in June; please call patient to schedule appt

## 2022-05-26 NOTE — Telephone Encounter (Signed)
Patient scheduled.

## 2022-05-29 ENCOUNTER — Other Ambulatory Visit: Payer: Self-pay | Admitting: Internal Medicine

## 2022-06-02 ENCOUNTER — Ambulatory Visit (INDEPENDENT_AMBULATORY_CARE_PROVIDER_SITE_OTHER): Payer: PPO

## 2022-06-02 DIAGNOSIS — I447 Left bundle-branch block, unspecified: Secondary | ICD-10-CM | POA: Diagnosis not present

## 2022-06-03 LAB — CUP PACEART REMOTE DEVICE CHECK
Battery Remaining Longevity: 47 mo
Battery Remaining Percentage: 55 %
Battery Voltage: 2.95 V
Brady Statistic AP VP Percent: 1.6 %
Brady Statistic AP VS Percent: 1 %
Brady Statistic AS VP Percent: 93 %
Brady Statistic AS VS Percent: 3 %
Brady Statistic RA Percent Paced: 1 %
Date Time Interrogation Session: 20240508020016
HighPow Impedance: 83 Ohm
HighPow Impedance: 83 Ohm
Implantable Lead Connection Status: 753985
Implantable Lead Connection Status: 753985
Implantable Lead Connection Status: 753985
Implantable Lead Implant Date: 20150121
Implantable Lead Implant Date: 20150121
Implantable Lead Implant Date: 20150121
Implantable Lead Location: 753858
Implantable Lead Location: 753859
Implantable Lead Location: 753860
Implantable Lead Model: 7122
Implantable Pulse Generator Implant Date: 20210512
Lead Channel Impedance Value: 480 Ohm
Lead Channel Impedance Value: 480 Ohm
Lead Channel Impedance Value: 610 Ohm
Lead Channel Pacing Threshold Amplitude: 0.875 V
Lead Channel Pacing Threshold Amplitude: 1.125 V
Lead Channel Pacing Threshold Amplitude: 1.25 V
Lead Channel Pacing Threshold Pulse Width: 0.4 ms
Lead Channel Pacing Threshold Pulse Width: 0.4 ms
Lead Channel Pacing Threshold Pulse Width: 0.5 ms
Lead Channel Sensing Intrinsic Amplitude: 11.7 mV
Lead Channel Sensing Intrinsic Amplitude: 5 mV
Lead Channel Setting Pacing Amplitude: 1.875
Lead Channel Setting Pacing Amplitude: 2.125
Lead Channel Setting Pacing Amplitude: 2.25 V
Lead Channel Setting Pacing Pulse Width: 0.4 ms
Lead Channel Setting Pacing Pulse Width: 0.5 ms
Lead Channel Setting Sensing Sensitivity: 0.5 mV
Pulse Gen Serial Number: 9890691

## 2022-06-04 ENCOUNTER — Other Ambulatory Visit: Payer: Self-pay | Admitting: Internal Medicine

## 2022-06-04 DIAGNOSIS — I4891 Unspecified atrial fibrillation: Secondary | ICD-10-CM

## 2022-06-06 ENCOUNTER — Other Ambulatory Visit: Payer: Self-pay | Admitting: Cardiovascular Disease

## 2022-06-07 NOTE — Telephone Encounter (Signed)
Prescription refill request for Xarelto received.  Indication:afib Last office visit:2/24 Weight:129.4  kg Age:62 Scr:0.8  5/23 CrCl:179.72  ml/min  Prescription refilled

## 2022-06-08 ENCOUNTER — Other Ambulatory Visit: Payer: Self-pay | Admitting: Family Medicine

## 2022-06-09 NOTE — Telephone Encounter (Signed)
Refill request for TRAMADOL 50MG  TABLETS   LOV - 02/17/22 Next OV - 07/09/22 Last refill - 03/09/22 #60/2

## 2022-06-22 ENCOUNTER — Other Ambulatory Visit: Payer: Self-pay | Admitting: Family Medicine

## 2022-06-22 DIAGNOSIS — E119 Type 2 diabetes mellitus without complications: Secondary | ICD-10-CM

## 2022-06-22 DIAGNOSIS — Z125 Encounter for screening for malignant neoplasm of prostate: Secondary | ICD-10-CM

## 2022-06-22 LAB — HM DIABETES EYE EXAM

## 2022-06-23 NOTE — Progress Notes (Signed)
Remote ICD transmission.   

## 2022-07-02 ENCOUNTER — Other Ambulatory Visit (INDEPENDENT_AMBULATORY_CARE_PROVIDER_SITE_OTHER): Payer: PPO

## 2022-07-02 DIAGNOSIS — Z7984 Long term (current) use of oral hypoglycemic drugs: Secondary | ICD-10-CM

## 2022-07-02 DIAGNOSIS — E119 Type 2 diabetes mellitus without complications: Secondary | ICD-10-CM

## 2022-07-02 DIAGNOSIS — Z125 Encounter for screening for malignant neoplasm of prostate: Secondary | ICD-10-CM | POA: Diagnosis not present

## 2022-07-02 LAB — CBC WITH DIFFERENTIAL/PLATELET
Basophils Absolute: 0 10*3/uL (ref 0.0–0.1)
Basophils Relative: 0.6 % (ref 0.0–3.0)
Eosinophils Absolute: 0.1 10*3/uL (ref 0.0–0.7)
Eosinophils Relative: 1.6 % (ref 0.0–5.0)
HCT: 49 % (ref 39.0–52.0)
Hemoglobin: 16.1 g/dL (ref 13.0–17.0)
Lymphocytes Relative: 19 % (ref 12.0–46.0)
Lymphs Abs: 1.3 10*3/uL (ref 0.7–4.0)
MCHC: 32.8 g/dL (ref 30.0–36.0)
MCV: 90.6 fl (ref 78.0–100.0)
Monocytes Absolute: 0.5 10*3/uL (ref 0.1–1.0)
Monocytes Relative: 7.2 % (ref 3.0–12.0)
Neutro Abs: 4.8 10*3/uL (ref 1.4–7.7)
Neutrophils Relative %: 71.6 % (ref 43.0–77.0)
Platelets: 179 10*3/uL (ref 150.0–400.0)
RBC: 5.41 Mil/uL (ref 4.22–5.81)
RDW: 14.3 % (ref 11.5–15.5)
WBC: 6.7 10*3/uL (ref 4.0–10.5)

## 2022-07-02 LAB — COMPREHENSIVE METABOLIC PANEL
ALT: 16 U/L (ref 0–53)
AST: 15 U/L (ref 0–37)
Albumin: 4.4 g/dL (ref 3.5–5.2)
Alkaline Phosphatase: 46 U/L (ref 39–117)
BUN: 17 mg/dL (ref 6–23)
CO2: 28 mEq/L (ref 19–32)
Calcium: 9.2 mg/dL (ref 8.4–10.5)
Chloride: 101 mEq/L (ref 96–112)
Creatinine, Ser: 0.79 mg/dL (ref 0.40–1.50)
GFR: 96.43 mL/min (ref >60.00)
Glucose, Bld: 225 mg/dL — ABNORMAL HIGH (ref 70–99)
Potassium: 4.4 mEq/L (ref 3.5–5.1)
Sodium: 138 mEq/L (ref 135–145)
Total Bilirubin: 0.6 mg/dL (ref 0.2–1.2)
Total Protein: 6.7 g/dL (ref 6.0–8.3)

## 2022-07-02 LAB — MICROALBUMIN / CREATININE URINE RATIO
Creatinine,U: 56.9 mg/dL
Microalb Creat Ratio: 5.6 mg/g (ref 0.0–30.0)
Microalb, Ur: 3.2 mg/dL — ABNORMAL HIGH (ref 0.0–1.9)

## 2022-07-02 LAB — LIPID PANEL
Cholesterol: 180 mg/dL (ref 0–200)
HDL: 42.3 mg/dL (ref >39.00)
LDL Cholesterol: 105 mg/dL — ABNORMAL HIGH (ref 0–99)
NonHDL: 137.28
Total CHOL/HDL Ratio: 4
Triglycerides: 159 mg/dL — ABNORMAL HIGH (ref 0.0–149.0)
VLDL: 31.8 mg/dL (ref 0.0–40.0)

## 2022-07-02 LAB — TSH: TSH: 0.88 u[IU]/mL (ref 0.35–5.50)

## 2022-07-02 LAB — PSA: PSA: 2.68 ng/mL (ref 0.10–4.00)

## 2022-07-02 LAB — HEMOGLOBIN A1C: Hgb A1c MFr Bld: 7.7 % — ABNORMAL HIGH (ref 4.6–6.5)

## 2022-07-04 ENCOUNTER — Other Ambulatory Visit: Payer: Self-pay | Admitting: Family Medicine

## 2022-07-05 ENCOUNTER — Other Ambulatory Visit: Payer: Self-pay | Admitting: Family Medicine

## 2022-07-05 NOTE — Telephone Encounter (Signed)
Refill request for ALPRAZOLAM 0.25MG  TABLETS   LOV - 02/17/22 Next OV - 07/09/22 Last refill - 04/07/22 #30/2

## 2022-07-06 NOTE — Telephone Encounter (Signed)
Sent. Thanks.   

## 2022-07-08 ENCOUNTER — Ambulatory Visit (INDEPENDENT_AMBULATORY_CARE_PROVIDER_SITE_OTHER): Payer: PPO

## 2022-07-08 VITALS — Ht 76.0 in | Wt 278.0 lb

## 2022-07-08 DIAGNOSIS — Z0000 Encounter for general adult medical examination without abnormal findings: Secondary | ICD-10-CM

## 2022-07-08 NOTE — Patient Instructions (Signed)
Aaron Thompson , Thank you for taking time to come for your Medicare Wellness Visit. I appreciate your ongoing commitment to your health goals. Please review the following plan we discussed and let me know if I can assist you in the future.   These are the goals we discussed:  Goals      DIET - EAT MORE FRUITS AND VEGETABLES     Patient Stated     05/15/2019, I will maintain and continue medications as prescribed.     Patient Stated     07/03/2020, I will continue to walk daily for about 20 minutes.     Patient Stated     Lose weight        This is a list of the screening recommended for you and due dates:  Health Maintenance  Topic Date Due   Zoster (Shingles) Vaccine (1 of 2) Never done   COVID-19 Vaccine (4 - 2023-24 season) 09/25/2021   Flu Shot  08/26/2022   Complete foot exam   12/26/2022   Hemoglobin A1C  01/01/2023   Eye exam for diabetics  06/22/2023   Yearly kidney function blood test for diabetes  07/02/2023   Yearly kidney health urinalysis for diabetes  07/02/2023   Medicare Annual Wellness Visit  07/08/2023   DTaP/Tdap/Td vaccine (2 - Tdap) 07/22/2025   Colon Cancer Screening  10/27/2026   Hepatitis C Screening  Completed   HIV Screening  Completed   HPV Vaccine  Aged Out   Managing Pain Without Opioids Opioids are strong medicines used to treat moderate to severe pain. For some people, especially those who have long-term (chronic) pain, opioids may not be the best choice for pain management due to: Side effects like nausea, constipation, and sleepiness. The risk of addiction (opioid use disorder). The longer you take opioids, the greater your risk of addiction. Pain that lasts for more than 3 months is called chronic pain. Managing chronic pain usually requires more than one approach and is often provided by a team of health care providers working together (multidisciplinary approach). Pain management may be done at a pain management center or pain clinic. How to  manage pain without the use of opioids Use non-opioid medicines Non-opioid medicines for pain may include: Over-the-counter or prescription non-steroidal anti-inflammatory drugs (NSAIDs). These may be the first medicines used for pain. They work well for muscle and bone pain, and they reduce swelling. Acetaminophen. This over-the-counter medicine may work well for milder pain but not swelling. Antidepressants. These may be used to treat chronic pain. A certain type of antidepressant (tricyclics) is often used. These medicines are given in lower doses for pain than when used for depression. Anticonvulsants. These are usually used to treat seizures but may also reduce nerve (neuropathic) pain. Muscle relaxants. These relieve pain caused by sudden muscle tightening (spasms). You may also use a pain medicine that is applied to the skin as a patch, cream, or gel (topical analgesic), such as a numbing medicine. These may cause fewer side effects than medicines taken by mouth. Do certain therapies as directed Some therapies can help with pain management. They include: Physical therapy. You will do exercises to gain strength and flexibility. A physical therapist may teach you exercises to move and stretch parts of your body that are weak, stiff, or painful. You can learn these exercises at physical therapy visits and practice them at home. Physical therapy may also involve: Massage. Heat wraps or applying heat or cold to affected areas. Lobbyist  signals that interrupt pain signals (transcutaneous electrical nerve stimulation, TENS). Weak lasers that reduce pain and swelling (low-level laser therapy). Signals from your body that help you learn to regulate pain (biofeedback). Occupational therapy. This helps you to learn ways to function at home and work with less pain. Recreational therapy. This involves trying new activities or hobbies, such as a physical activity or drawing. Mental health therapy,  including: Cognitive behavioral therapy (CBT). This helps you learn coping skills for dealing with pain. Acceptance and commitment therapy (ACT) to change the way you think and react to pain. Relaxation therapies, including muscle relaxation exercises and mindfulness-based stress reduction. Pain management counseling. This may be individual, family, or group counseling.  Receive medical treatments Medical treatments for pain management include: Nerve block injections. These may include a pain blocker and anti-inflammatory medicines. You may have injections: Near the spine to relieve chronic back or neck pain. Into joints to relieve back or joint pain. Into nerve areas that supply a painful area to relieve body pain. Into muscles (trigger point injections) to relieve some painful muscle conditions. A medical device placed near your spine to help block pain signals and relieve nerve pain or chronic back pain (spinal cord stimulation device). Acupuncture. Follow these instructions at home Medicines Take over-the-counter and prescription medicines only as told by your health care provider. If you are taking pain medicine, ask your health care providers about possible side effects to watch out for. Do not drive or use heavy machinery while taking prescription opioid pain medicine. Lifestyle  Do not use drugs or alcohol to reduce pain. If you drink alcohol, limit how much you have to: 0-1 drink a day for women who are not pregnant. 0-2 drinks a day for men. Know how much alcohol is in a drink. In the [place] one drink equals one 12 oz bottle of beer (355 mL), one 5 oz glass of wine (148 mL), or one 1 oz glass of hard liquor (44 mL). Do not use any products that contain nicotine or tobacco. These products include cigarettes, chewing tobacco, and vaping devices, such as e-cigarettes. If you need help quitting, ask your health care provider. Eat a healthy diet and maintain a healthy weight. Poor  diet and excess weight may make pain worse. Eat foods that are high in fiber. These include fresh fruits and vegetables, whole grains, and beans. Limit foods that are high in fat and processed sugars, such as fried and sweet foods. Exercise regularly. Exercise lowers stress and may help relieve pain. Ask your health care provider what activities and exercises are safe for you. If your health care provider approves, join an exercise class that combines movement and stress reduction. Examples include yoga and tai chi. Get enough sleep. Lack of sleep may make pain worse. Lower stress as much as possible. Practice stress reduction techniques as told by your therapist. General instructions Work with all your pain management providers to find the treatments that work best for you. You are an important member of your pain management team. There are many things you can do to reduce pain on your own. Consider joining an online or in-person support group for people who have chronic pain. Keep all follow-up visits. This is important. Where to find more information You can find more information about managing pain without opioids from: American Academy of Pain Medicine: painmed.org Institute for Chronic Pain: instituteforchronicpain.org American Chronic Pain Association: theacpa.org Contact a health care provider if: You have side effects from pain  medicine. Your pain gets worse or does not get better with treatments or home therapy. You are struggling with anxiety or depression. Summary Many types of pain can be managed without opioids. Chronic pain may respond better to pain management without opioids. Pain is best managed when you and a team of health care providers work together. Pain management without opioids may include non-opioid medicines, medical treatments, physical therapy, mental health therapy, and lifestyle changes. Tell your health care providers if your pain gets worse or is not being  managed well enough. This information is not intended to replace advice given to you by your health care provider. Make sure you discuss any questions you have with your health care provider. Document Revised: 04/23/2020 Document Reviewed: 04/23/2020 Elsevier Patient Education  2024 [employer]   Advanced directives: none  Conditions/risks identified: Aim for 30 minutes of exercise or brisk walking, 6-8 glasses of water, and 5 servings of fruits and vegetables each day.   Next appointment: Follow up in one year for your annual wellness visit 07/12/23 @ 9:15  Preventive Care 40-64 Years, Male Preventive care refers to lifestyle choices and visits with your health care provider that can promote health and wellness. What does preventive care include? A yearly physical exam. This is also called an annual well check. Dental exams once or twice a year. Routine eye exams. Ask your health care provider how often you should have your eyes checked. Personal lifestyle choices, including: Daily care of your teeth and gums. Regular physical activity. Eating a healthy diet. Avoiding tobacco and drug use. Limiting alcohol use. Practicing safe sex. Taking low-dose aspirin every day starting at age 29. What happens during an annual well check? The services and screenings done by your health care provider during your annual well check will depend on your age, overall health, lifestyle risk factors, and family history of disease. Counseling  Your health care provider may ask you questions about your: Alcohol use. Tobacco use. Drug use. Emotional well-being. Home and relationship well-being. Sexual activity. Eating habits. Work and work Astronomer. Screening  You may have the following tests or measurements: Height, weight, and BMI. Blood pressure. Lipid and cholesterol levels. These may be checked every 5 years, or more frequently if you are over 24 years old. Skin check. Lung cancer  screening. You may have this screening every year starting at age 1 if you have a 30-pack-year history of smoking and currently smoke or have quit within the past 15 years. Fecal occult blood test (FOBT) of the stool. You may have this test every year starting at age 42. Flexible sigmoidoscopy or colonoscopy. You may have a sigmoidoscopy every 5 years or a colonoscopy every 10 years starting at age 65. Prostate cancer screening. Recommendations will vary depending on your family history and other risks. Hepatitis C blood test. Hepatitis B blood test. Sexually transmitted disease (STD) testing. Diabetes screening. This is done by checking your blood sugar (glucose) after you have not eaten for a while (fasting). You may have this done every 1-3 years. Discuss your test results, treatment options, and if necessary, the need for more tests with your health care provider. Vaccines  Your health care provider may recommend certain vaccines, such as: Influenza vaccine. This is recommended every year. Tetanus, diphtheria, and acellular pertussis (Tdap, Td) vaccine. You may need a Td booster every 10 years. Zoster vaccine. You may need this after age 61. Pneumococcal 13-valent conjugate (PCV13) vaccine. You may need this if you have certain  conditions and have not been vaccinated. Pneumococcal polysaccharide (PPSV23) vaccine. You may need one or two doses if you smoke cigarettes or if you have certain conditions. Talk to your health care provider about which screenings and vaccines you need and how often you need them. This information is not intended to replace advice given to you by your health care provider. Make sure you discuss any questions you have with your health care provider. Document Released: 02/07/2015 Document Revised: 10/01/2015 Document Reviewed: 11/12/2014 Elsevier Interactive Patient Education  2017 [employer].  Fall Prevention in the Home Falls can cause injuries. They can happen  to people of all ages. There are many things you can do to make your home safe and to help prevent falls. What can I do on the outside of my home? Regularly fix the edges of walkways and driveways and fix any cracks. Remove anything that might make you trip as you walk through a door, such as a raised step or threshold. Trim any bushes or trees on the path to your home. Use bright outdoor lighting. Clear any walking paths of anything that might make someone trip, such as rocks or tools. Regularly check to see if handrails are loose or broken. Make sure that both sides of any steps have handrails. Any raised decks and porches should have guardrails on the edges. Have any leaves, snow, or ice cleared regularly. Use sand or salt on walking paths during winter. Clean up any spills in your garage right away. This includes oil or grease spills. What can I do in the bathroom? Use night lights. Install grab bars by the toilet and in the tub and shower. Do not use towel bars as grab bars. Use non-skid mats or decals in the tub or shower. If you need to sit down in the shower, use a plastic, non-slip stool. Keep the floor dry. Clean up any water that spills on the floor as soon as it happens. Remove soap buildup in the tub or shower regularly. Attach bath mats securely with double-sided non-slip rug tape. Do not have throw rugs and other things on the floor that can make you trip. What can I do in the bedroom? Use night lights. Make sure that you have a light by your bed that is easy to reach. Do not use any sheets or blankets that are too big for your bed. They should not hang down onto the floor. Have a firm chair that has side arms. You can use this for support while you get dressed. Do not have throw rugs and other things on the floor that can make you trip. What can I do in the kitchen? Clean up any spills right away. Avoid walking on wet floors. Keep items that you use a lot in  easy-to-reach places. If you need to reach something above you, use a strong step stool that has a grab bar. Keep electrical cords out of the way. Do not use floor polish or wax that makes floors slippery. If you must use wax, use non-skid floor wax. Do not have throw rugs and other things on the floor that can make you trip. What can I do with my stairs? Do not leave any items on the stairs. Make sure that there are handrails on both sides of the stairs and use them. Fix handrails that are broken or loose. Make sure that handrails are as long as the stairways. Check any carpeting to make sure that it is firmly attached to  the stairs. Fix any carpet that is loose or worn. Avoid having throw rugs at the top or bottom of the stairs. If you do have throw rugs, attach them to the floor with carpet tape. Make sure that you have a light switch at the top of the stairs and the bottom of the stairs. If you do not have them, ask someone to add them for you. What else can I do to help prevent falls? Wear shoes that: Do not have high heels. Have rubber bottoms. Are comfortable and fit you well. Are closed at the toe. Do not wear sandals. If you use a stepladder: Make sure that it is fully opened. Do not climb a closed stepladder. Make sure that both sides of the stepladder are locked into place. Ask someone to hold it for you, if possible. Clearly J[place] and make sure that you can see: Any grab bars or handrails. First and last steps. Where the edge of each step is. Use tools that help you move around (mobility aids) if they are needed. These include: Canes. Walkers. Scooters. Crutches. Turn on the lights when you go into a dark area. Replace any light bulbs as soon as they burn out. Set up your furniture so you have a clear path. Avoid moving your furniture around. If any of your floors are uneven, fix them. If there are any pets around you, be aware of where they are. Review your medicines  with your doctor. Some medicines can make you feel dizzy. This can increase your chance of falling. Ask your doctor what other things that you can do to help prevent falls. This information is not intended to replace advice given to you by your health care provider. Make sure you discuss any questions you have with your health care provider. Document Released: 11/07/2008 Document Revised: 06/19/2015 Document Reviewed: 02/15/2014 Elsevier Interactive Patient Education  2017 [employer].

## 2022-07-08 NOTE — Progress Notes (Signed)
I connected with  Aaron Thompson on 07/08/22 by a audio enabled telemedicine application and verified that I am speaking with the correct person using two identifiers.  Patient Location: Home  Provider Location: Home Office  I discussed the limitations of evaluation and management by telemedicine. The patient expressed understanding and agreed to proceed.  Subjective:   Aaron Thompson is a 62 y.o. male who presents for Medicare Annual/Subsequent preventive examination.  Review of Systems      Cardiac Risk Factors include: advanced age (>80men, >57 women);hypertension;diabetes mellitus;male gender     Objective:    Today's Vitals   07/08/22 0942 07/08/22 0943  Weight: 278 lb (126.1 kg)   Height: 6\' 4"  (1.93 m)   PainSc:  3    Body mass index is 33.84 kg/m.     07/08/2022    9:57 AM 07/06/2021    9:57 AM 07/03/2020   10:01 AM 06/06/2019   10:17 AM 05/15/2019    3:38 PM 10/26/2016    9:22 AM 10/21/2016    1:51 PM  Advanced Directives  Does Patient Have a Medical Advance Directive? No No Yes Yes Yes Yes No  Type of Best boy of [place];Living will Healthcare Power of [place];Living will Healthcare Power of [place];Living will Living will   Does patient want to make changes to medical advance directive?    No - Patient declined     Copy of Healthcare Power of Attorney in Chart?   No - copy requested  No - copy requested No - copy requested No - copy requested  Would patient like information on creating a medical advance directive? No - Patient declined No - Patient declined         Current Medications (verified) Outpatient Encounter Medications as of 07/08/2022  Medication Sig   acetaminophen (TYLENOL) 325 MG tablet Take 650 mg by mouth every 6 (six) hours as needed for mild pain or moderate pain.   ALPRAZolam (XANAX) 0.25 MG tablet TAKE 1 TABLET BY MOUTH DAILY AS NEEDED FOR ANXIETY   Blood Glucose Monitoring Suppl (ONE TOUCH ULTRA 2) w/Device KIT Use  daily to check sugars as needed. Dx E11.9   carvedilol (COREG) 25 MG tablet TAKE 2 TABLETS(50 MG) BY MOUTH TWICE DAILY WITH A MEAL   diltiazem (CARDIZEM) 30 MG tablet TAKE 1 TABLET BY MOUTH AS NEEDED, MAY REPEAT DOSE IN 4 HOURS ONCE IF NEEDED   dofetilide (TIKOSYN) 500 MCG capsule TAKE 1 CAPSULE(500 MCG) BY MOUTH TWICE DAILY   empagliflozin (JARDIANCE) 25 MG TABS tablet TAKE 1 TABLET BY MOUTH EVERY DAY   furosemide (LASIX) 40 MG tablet Take one tablet by mouth every Friday.   glipiZIDE (GLUCOTROL XL) 10 MG 24 hr tablet TAKE 1 TABLET(10 MG) BY MOUTH DAILY WITH BREAKFAST   loratadine (CLARITIN) 10 MG tablet Take 10 mg by mouth daily as needed for allergies.   Magnesium Oxide 400 MG CAPS Take 1 capsule (400 mg total) by mouth daily.   metFORMIN (GLUCOPHAGE-XR) 500 MG 24 hr tablet TAKE 3 TABLETS(1500 MG) BY MOUTH DAILY WITH BREAKFAST   Multiple Vitamin (MULTIVITAMIN) tablet Take 1 tablet by mouth daily.   omeprazole (PRILOSEC) 40 MG capsule TAKE 1 CAPSULE(40 MG) BY MOUTH DAILY   ONETOUCH ULTRA test strip CHECK GLUCOSE TWICE DAILY AND AS NEEDED   potassium chloride SA (KLOR-CON M) 20 MEQ tablet TAKE 2 TABLETS BY MOUTH TWICE DAILY   pravastatin (PRAVACHOL) 10 MG tablet TAKE 1 TABLET(10 MG) BY MOUTH  DAILY   sacubitril-valsartan (ENTRESTO) 97-103 MG TAKE 1 TABLET BY MOUTH TWICE DAILY   spironolactone (ALDACTONE) 25 MG tablet TAKE 1 TABLET(25 MG) BY MOUTH DAILY   traMADol (ULTRAM) 50 MG tablet TAKE 1 TABLET(50 MG) BY MOUTH EVERY 12 HOURS AS NEEDED FOR PAIN   XARELTO 20 MG TABS tablet TAKE 1 TABLET(20 MG) BY MOUTH DAILY WITH SUPPER   HYDROcodone bit-homatropine (HYCODAN) 5-1.5 MG/5ML syrup Take 5 mLs by mouth at bedtime as needed for cough. (Patient not taking: Reported on 07/08/2022)   No facility-administered encounter medications on file as of 07/08/2022.    Allergies (verified) Atorvastatin   History: Past Medical History:  Diagnosis Date   AICD (automatic cardioverter/defibrillator) present     Anxiety state, unspecified    Calculus of kidney 1981   "passed it on my own"   Chronic systolic CHF (congestive heart failure) (HCC)    Dermatophytosis of the body    DJD (degenerative joint disease)    Esophageal reflux    Hx of colonic polyps    Hypertension    LBBB (left bundle branch block)    intermittent   Non-ischemic cardiomyopathy (HCC)    a. cath 2013: minor nonobstructive CAD.   OSA (obstructive sleep apnea)    he can't tolerate CPAP.    Osteoarthrosis, unspecified whether generalized or localized, hand    Other chronic nonalcoholic liver disease    fatty liver   PAF (paroxysmal atrial fibrillation) (HCC)    a. s/p DCCV 6/11; previously on Pradaxa;  b. Event Monitor 2012->No PAF;  c. 09/2011 s/p DCCV ->Xarelto started. d. 03/2014: inappropriate ICD shocks for AF-RVR, started on Tikosyn, Xarelto restarted.   Type II diabetes mellitus (HCC)    Unspecified hearing loss    no hearing aid   Past Surgical History:  Procedure Laterality Date   Abdominal US  01/2007   Fatty liver, no gallstones   APPENDECTOMY     ATRIAL TACH ABLATION  09/2015   BI-VENTRICULAR IMPLANTABLE CARDIOVERTER DEFIBRILLATOR [name] 02/14/2013   STJ CRTD implanted by Dr Graciela Husbands   BILATERAL KNEE ARTHROSCOPY     BIV ICD GENERATOR CHANGEOUT [name] 06/06/2019   Procedure: BIV ICD GENERATOR CHANGEOUT;  Surgeon: Duke Salvia, MD;  [facility] INVASIVE CV LAB;  Service: Cardiovascular;  Laterality: [name];   CARDIOVERSION  09/29/2011   Procedure: CARDIOVERSION;  Surgeon: Vesta Mixer, MD;  Location: [facility] OR;  Service: Cardiovascular;  Laterality: [name];   COLONOSCOPY WITH PROPOFOL [name] 10/26/2016   Procedure: COLONOSCOPY WITH PROPOFOL;  Surgeon: Hilarie Fredrickson, MD;  Location: [facility] ENDOSCOPY;  Service: Endoscopy;  Laterality: [name];   DOPPLER ECHOCARDIOGRAPHY  11/2009   Decreased EF of 35-40%   ELECTROPHYSIOLOGIC STUDY [name] 10/14/2015   Procedure: Atrial Fibrillation Ablation;  Surgeon: Hillis Range, MD;  Location: [facility] INVASIVE  CV LAB;  Service: Cardiovascular;  Laterality: [name];   LEFT HEART CATHETERIZATION WITH CORONARY ANGIOGRAM [name] 08/18/2011   Procedure: LEFT HEART CATHETERIZATION WITH CORONARY ANGIOGRAM;  Surgeon: Peter M Swaziland, MD;  Location: [facility] CATH LAB;  Service: Cardiovascular;  Laterality: [name];   NECK SURGERY     plate/fusion   Stress Cardiolite  08/2005   Normal, EF 42%   UPPER GASTROINTESTINAL ENDOSCOPY  08/2006   GERD   Family History  Problem Relation Age of Onset   Hypertension Mother    Diabetes Mother    Heart failure Mother        Died @ 99   Hypertension Father    Heart failure Father  Died @ 61   Prostate cancer Father    Other Sister    Multiple myeloma Sister    Leukemia Sister    Colon polyps Sister    Diabetes Brother    Kidney disease Brother    Diabetes Brother    Prostate cancer Brother    Diabetes Brother    Cirrhosis Maternal Uncle        alcohol related   Stroke Paternal Grandmother    Colon cancer Paternal Grandmother    Heart attack Paternal Grandfather    Esophageal cancer Neg Hx    Stomach cancer Neg Hx    Pancreatic cancer Neg Hx    Social History   Socioeconomic History   Marital status: Married    Spouse name: Not on file   Number of children: 2   Years of education: Not on file   Highest education level: Not on file  Occupational History   Occupation: Chief Strategy Officer: UNEMPLOYED  Tobacco Use   Smoking status: Never    Passive exposure: Past   Smokeless tobacco: Former    Types: Snuff    Quit date: 10/12/2004   Tobacco comments:    02/14/2013 "quit snuff in 2006"  Vaping Use   Vaping Use: Never used  Substance and Sexual Activity   Alcohol use: No    Alcohol/week: 0.0 standard drinks of alcohol   Drug use: No   Sexual activity: Yes  Other Topics Concern   Not on file  Social History Narrative   Lives in [place] with wife.  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   Married 1990   Social Determinants of Health    Financial Resource Strain: Low Risk  (07/08/2022)   Overall Financial Resource Strain (CARDIA)    Difficulty of Paying Living Expenses: Not hard at all  Food Insecurity: No Food Insecurity (07/08/2022)   Hunger Vital Sign    Worried About Running Out of Food in the Last Year: Never true    Ran Out of Food in the Last Year: Never true  Transportation Needs: No Transportation Needs (07/08/2022)   PRAPARE - Administrator, Civil Service (Medical): No    Lack of Transportation (Non-Medical): No  Physical Activity: Sufficiently Active (07/08/2022)   Exercise Vital Sign    Days of Exercise per Week: 3 days    Minutes of Exercise per Session: 60 min  Stress: No Stress Concern Present (07/08/2022)   [employer] of Occupational Health - Occupational Stress Questionnaire    Feeling of Stress : Not at all  Social Connections: Socially Integrated (07/08/2022)   Social Connection and Isolation Panel [NHANES]    Frequency of Communication with Friends and Family: More than three times a week    Frequency of Social Gatherings with Friends and Family: More than three times a week    Attends Religious Services: More than 4 times per year    Active Member of [employer] or Organizations: Yes    Attends Engineer, structural: More than 4 times per year    Marital Status: Married    Tobacco Counseling Counseling given: Not Answered Tobacco comments: 02/14/2013 "quit snuff in 2006"   Clinical Intake:  Pre-visit preparation completed: Yes  Pain : 0-10 Pain Score: 3  Pain Type: Chronic pain Pain Location: Generalized Pain Descriptors / Indicators: Aching Pain Onset: More than a month ago     Nutritional Risks: None Diabetes: Yes CBG done?: No Did pt. bring in CBG  monitor from home?: No  How often do you need to have someone help you when you read instructions, pamphlets, or other written materials from your doctor or pharmacy?: 1 - Never  Diabetic? Nutrition Risk  Assessment:  Has the patient had any N/V/D within the last 2 months?  No  Does the patient have any non-healing wounds?  No  Has the patient had any unintentional weight loss or weight gain?  No   Diabetes:  Is the patient diabetic?  Yes  If diabetic, was a CBG obtained today?  No  Did the patient bring in their glucometer from home?  No  How often do you monitor your CBG's? 3 times a week.   Financial Strains and Diabetes Management:  Are you having any financial strains with the device, your supplies or your medication? No .  Does the patient want to be seen by Chronic Care Management for management of their diabetes?  No  Would the patient like to be referred to a Nutritionist or for Diabetic Management?  No   Diabetic Exams:  Diabetic Eye Exam: Completed 06/22/22 [facility] Diabetic Foot Exam: Completed 12/25/21 PCP    Interpreter Needed?: No  Information entered by :: C.Jamiel Goncalves LPN   Activities of Daily Living    07/08/2022    9:58 AM 07/05/2022    9:02 AM  In your present state of health, do you have any difficulty performing the following activities:  Hearing? 0 0  Vision? 0 0  Difficulty concentrating or making decisions? 0 0  Walking or climbing stairs? 0 0  Dressing or bathing? 0 0  Doing errands, shopping? 0 0  Preparing Food and eating ? N N  Using the Toilet? N N  In the past six months, have you accidently leaked urine? N N  Do you have problems with loss of bowel control? N N  Managing your Medications? N N  Managing your Finances? N N  Housekeeping or managing your Housekeeping? N N    Patient Care Team: Joaquim Nam, MD as PCP - General (Family Medicine) Wendall Stade, MD as PCP - Cardiology (Cardiology) Duke Salvia, MD as PCP - Electrophysiology (Cardiology) Kathyrn Sheriff, [facility] as Pharmacist (Pharmacist)  Indicate any recent Medical Services you may have received from other than [facility] providers in the past year (date may be  approximate).     Assessment:   This is a routine wellness examination for [employer].  Hearing/Vision screen Hearing Screening - Comments:: Some hearing difficulty but no aid Vision Screening - Comments:: Glasses - [facility]  Dietary issues and exercise activities discussed: Current Exercise Habits: Structured exercise class, Type of exercise: walking;strength training/weights, Time (Minutes): 60, Frequency (Times/Week): 3, Weekly Exercise (Minutes/Week): 180, Intensity: Moderate, Exercise limited by: None identified   Goals Addressed             This Visit's Progress    Patient Stated       Lose weight       Depression Screen    07/08/2022    9:56 AM 07/06/2021    9:55 AM 07/03/2020   10:06 AM 06/21/2019    8:50 AM 05/15/2019    3:41 PM 12/01/2018    8:50 AM 07/05/2017    8:37 AM  PHQ 2/9 Scores  PHQ - 2 Score 0 0 0 0 0 0 0  PHQ- 9 Score  0 0  0 0     Fall Risk    07/08/2022  9:47 AM 07/05/2022    9:02 AM 07/06/2021    9:58 AM 07/03/2020   10:03 AM 05/15/2019    3:39 PM  Fall Risk   Falls in the past year? 0 0 0 0 1  Comment     tripped on step  Number falls in past yr: 0 0 0 0 0  Injury with Fall? 0 0 0 0 0  Risk for fall due to : No Fall Risks  No Fall Risks Medication side effect Medication side effect  Follow up Falls prevention discussed;Falls evaluation completed  Falls evaluation completed Falls evaluation completed;Falls prevention discussed Falls evaluation completed;Falls prevention discussed    FALL RISK PREVENTION PERTAINING TO THE HOME:  Any stairs in or around the home? No  If so, are there any without handrails? No  Home free of loose throw rugs in walkways, pet beds, electrical cords, etc? Yes  Adequate lighting in your home to reduce risk of falls? Yes   ASSISTIVE DEVICES UTILIZED TO PREVENT FALLS:  Life alert? No  Use of a cane, walker or w/c? No  Grab bars in the bathroom? No  Shower chair or bench in shower? Yes  Elevated toilet seat or a  handicapped toilet? Yes    Cognitive Function:    07/03/2020   10:09 AM 05/15/2019    3:45 PM  MMSE - Mini Mental State Exam  Not completed: Refused   Orientation to time  5  Orientation to Place  5  Registration  3  Attention/ Calculation  5  Recall  3  Language- repeat  1        07/08/2022    9:59 AM  6CIT Screen  What Year? 0 points  What month? 0 points  What time? 0 points  Count back from 20 0 points  Months in reverse 0 points  Repeat phrase 2 points  Total Score 2 points    Immunizations Immunization History  Administered Date(s) Administered   Influenza, Seasonal, Injecte, Preservative Fre 12/03/2013   Influenza,inj,Quad PF,6+ Mos 10/05/2016, 11/04/2017, 12/01/2018, 01/22/2021, 12/25/2021   Influenza-Unspecified 12/09/2012, 11/24/2014, 11/20/2019   Moderna Sars-Covid-2 Vaccination 05/13/2019, 06/12/2019, 10/04/2019   Pneumococcal Polysaccharide-23 09/30/2011   Td 07/23/2015    TDAP status: Up to date  Flu Vaccine status: Up to date  Pneumococcal vaccine status: Up to date  Covid-19 vaccine status: Information provided on how to obtain vaccines.   Qualifies for Shingles Vaccine? Yes   Zostavax completed No   Shingrix Completed?: Yes  Screening Tests Health Maintenance  Topic Date Due   Zoster Vaccines- Shingrix (1 of 2) Never done   COVID-19 Vaccine (4 - 2023-24 season) 09/25/2021   INFLUENZA VACCINE  08/26/2022   FOOT EXAM  12/26/2022   HEMOGLOBIN A1C  01/01/2023   OPHTHALMOLOGY EXAM  06/22/2023   Diabetic kidney evaluation - eGFR measurement  07/02/2023   Diabetic kidney evaluation - Urine ACR  07/02/2023   Medicare Annual Wellness (AWV)  07/08/2023   DTaP/Tdap/Td (2 - Tdap) 07/22/2025   Colonoscopy  10/27/2026   Hepatitis C Screening  Completed   HIV Screening  Completed   HPV VACCINES  Aged Out    Health Maintenance  Health Maintenance Due  Topic Date Due   Zoster Vaccines- Shingrix (1 of 2) Never done   COVID-19 Vaccine (4 -  2023-24 season) 09/25/2021    Colorectal cancer screening: Type of screening: Colonoscopy. Completed 10/26/16. Repeat every 10 years  Lung Cancer Screening: (Low Dose CT Chest recommended if Age  55-80 years, 30 pack-year currently smoking OR have quit w/in 15years.) does not qualify.   Lung Cancer Screening Referral: no  Additional Screening:  Hepatitis C Screening: does qualify; Completed 06/23/16  Vision Screening: Recommended annual ophthalmology exams for early detection of glaucoma and other disorders of the eye. Is the patient up to date with their annual eye exam?  Yes  Who is the provider or what is the name of the office in which the patient attends annual eye exams? 06/22/22 If pt is not established with a provider, would they like to be referred to a provider to establish care? Yes .   Dental Screening: Recommended annual dental exams for proper oral hygiene  Community Resource Referral / Chronic Care Management: CRR required this visit?  No   CCM required this visit?  No      Plan:     I have personally reviewed and noted the following in the patient's chart:   Medical and social history Use of alcohol, tobacco or illicit drugs  Current medications and supplements including opioid prescriptions. Patient is currently taking opioid prescriptions. Information provided to patient regarding non-opioid alternatives. Patient advised to discuss non-opioid treatment plan with their provider. Functional ability and status Nutritional status Physical activity Advanced directives List of other physicians Hospitalizations, surgeries, and ER visits in previous 12 months Vitals Screenings to include cognitive, depression, and falls Referrals and appointments  In addition, I have reviewed and discussed with patient certain preventive protocols, quality metrics, and best practice recommendations. A written personalized care plan for preventive services as well as general  preventive health recommendations were provided to patient.     Maryan Puls, LPN   1/61/0960   Nurse Notes: none

## 2022-07-09 ENCOUNTER — Encounter: Payer: Self-pay | Admitting: Family Medicine

## 2022-07-09 ENCOUNTER — Ambulatory Visit (INDEPENDENT_AMBULATORY_CARE_PROVIDER_SITE_OTHER): Payer: PPO | Admitting: Family Medicine

## 2022-07-09 VITALS — BP 124/72 | HR 70 | Temp 97.2°F | Ht 76.0 in | Wt 282.0 lb

## 2022-07-09 DIAGNOSIS — Z0000 Encounter for general adult medical examination without abnormal findings: Secondary | ICD-10-CM

## 2022-07-09 DIAGNOSIS — Z7984 Long term (current) use of oral hypoglycemic drugs: Secondary | ICD-10-CM

## 2022-07-09 DIAGNOSIS — E7800 Pure hypercholesterolemia, unspecified: Secondary | ICD-10-CM | POA: Diagnosis not present

## 2022-07-09 DIAGNOSIS — E119 Type 2 diabetes mellitus without complications: Secondary | ICD-10-CM | POA: Diagnosis not present

## 2022-07-09 DIAGNOSIS — M545 Low back pain, unspecified: Secondary | ICD-10-CM

## 2022-07-09 DIAGNOSIS — F411 Generalized anxiety disorder: Secondary | ICD-10-CM | POA: Diagnosis not present

## 2022-07-09 DIAGNOSIS — Z7189 Other specified counseling: Secondary | ICD-10-CM

## 2022-07-09 DIAGNOSIS — I10 Essential (primary) hypertension: Secondary | ICD-10-CM | POA: Diagnosis not present

## 2022-07-09 DIAGNOSIS — I4891 Unspecified atrial fibrillation: Secondary | ICD-10-CM

## 2022-07-09 MED ORDER — PRAVASTATIN SODIUM 10 MG PO TABS: 10.0000 mg | ORAL_TABLET | ORAL | Status: DC

## 2022-07-09 MED ORDER — POTASSIUM CHLORIDE CRYS ER 20 MEQ PO TBCR: 40.0000 meq | EXTENDED_RELEASE_TABLET | Freq: Every day | ORAL | Status: DC

## 2022-07-09 NOTE — Progress Notes (Unsigned)
Diabetes:  Using medications without difficulties: yes Hypoglycemic episodes:no Hyperglycemic episodes:no Feet problems: no Blood Sugars averaging: not checked often, up to 200 recently- that was higher than typical for patient.  He had been off diet recently.   eye exam within last year: yes MALB d/w pt.    Hypertension:    Using medication without problems or lightheadedness: yes Chest pain with exertion:no Edema:no Short of breath:no Labs d/w pt.    Elevated Cholesterol: Using medications without problems: yes Muscle aches: he had aches with more frequent dosing of pravastatin.  He is able to tolerate it MWF.   Diet compliance: d/w pt.  Exercise: d/w pt.    AF.  Anticoagulated, not needing diltiazem.  He is monitoring weight.    Xanax used prn.  Taken daily prn.  No ADE on med.  It helped.  Had gone to counseling prev, when he had cardiac concerns in his 30s.  Staying busy helps patient.    Taking tramadol prn.  No ADE on med.  It helped with joint pain.  Taken with tylenol.  It helps with back pain.    Flu 2023 Shingles d/w pt.  PNA 2013 Tetanus 2017 covid vaccine prev done.  Colon cancer screening 2018 Prostate cancer screening wnl 2024 Advance directive-wife designated if patient were incapacitated.  PMH and SH reviewed.   Vital signs, Meds and allergies reviewed.  ROS: Per HPI unless specifically indicated in ROS section   GEN: nad, alert and oriented HEENT: mucous membranes moist NECK: supple w/o LA CV: rrr.  PULM: ctab, no inc wob ABD: soft, +bs EXT: no edema SKIN: no acute rash  Diabetic foot exam: Normal inspection No skin breakdown No calluses  Normal DP pulses Normal sensation to light tough and monofilament Nails normal

## 2022-07-09 NOTE — Patient Instructions (Signed)
Try to get back on your diet and recheck A1c at a visit in about 3 months.  Take care.  Glad to see you.

## 2022-07-11 NOTE — Assessment & Plan Note (Signed)
He is going to get back on his diet and recheck his labs in about 3 months.  Continue Jardiance glipizide metformin in the meantime.

## 2022-07-11 NOTE — Assessment & Plan Note (Signed)
Continue Xanax as needed.  Not sedated.  Okay for outpatient follow-up.

## 2022-07-11 NOTE — Assessment & Plan Note (Signed)
Advance directive- wife designated if patient were incapacitated.  

## 2022-07-11 NOTE — Assessment & Plan Note (Signed)
Taking tramadol prn.  No ADE on med.  It helped with joint pain.  Taken with tylenol.  It helps with back pain.   Continue tramadol and Tylenol as is.

## 2022-07-11 NOTE — Assessment & Plan Note (Signed)
Discussed diet and exercise.  Continue spironolactone and Entresto Lasix Jardiance carvedilol as is.

## 2022-07-11 NOTE — Assessment & Plan Note (Signed)
Flu 2023 Shingles d/w pt.  PNA 2013 Tetanus 2017 covid vaccine prev done.  Colon cancer screening 2018 Prostate cancer screening wnl 2024 Advance directive-wife designated if patient were incapacitated.

## 2022-07-11 NOTE — Assessment & Plan Note (Signed)
Continue pravastatin.  Continue work on diet and exercise. 

## 2022-07-11 NOTE — Assessment & Plan Note (Signed)
Continue Xarelto as is.  Fortunately he has not needed as needed diltiazem.  Discussed monitoring his weight.

## 2022-07-13 ENCOUNTER — Other Ambulatory Visit: Payer: Self-pay | Admitting: Internal Medicine

## 2022-07-20 ENCOUNTER — Ambulatory Visit: Payer: PPO | Attending: Internal Medicine | Admitting: Internal Medicine

## 2022-07-20 ENCOUNTER — Encounter: Payer: Self-pay | Admitting: Internal Medicine

## 2022-07-20 ENCOUNTER — Other Ambulatory Visit: Payer: Self-pay | Admitting: Cardiovascular Disease

## 2022-07-20 ENCOUNTER — Other Ambulatory Visit: Payer: Self-pay | Admitting: Internal Medicine

## 2022-07-20 VITALS — BP 104/72 | HR 94 | Ht 76.0 in | Wt 285.6 lb

## 2022-07-20 DIAGNOSIS — Z95810 Presence of automatic (implantable) cardiac defibrillator: Secondary | ICD-10-CM | POA: Diagnosis not present

## 2022-07-20 DIAGNOSIS — R079 Chest pain, unspecified: Secondary | ICD-10-CM | POA: Diagnosis not present

## 2022-07-20 DIAGNOSIS — I428 Other cardiomyopathies: Secondary | ICD-10-CM | POA: Diagnosis not present

## 2022-07-20 DIAGNOSIS — I5022 Chronic systolic (congestive) heart failure: Secondary | ICD-10-CM

## 2022-07-20 DIAGNOSIS — I480 Paroxysmal atrial fibrillation: Secondary | ICD-10-CM | POA: Diagnosis not present

## 2022-07-20 DIAGNOSIS — I4510 Unspecified right bundle-branch block: Secondary | ICD-10-CM | POA: Diagnosis not present

## 2022-07-20 DIAGNOSIS — I4719 Other supraventricular tachycardia: Secondary | ICD-10-CM

## 2022-07-20 DIAGNOSIS — I493 Ventricular premature depolarization: Secondary | ICD-10-CM | POA: Insufficient documentation

## 2022-07-20 LAB — TROPONIN T: Troponin T (Highly Sensitive): 8 ng/L (ref 0–22)

## 2022-07-20 NOTE — Patient Instructions (Addendum)
Medication Instructions:  Your physician recommends that you continue on your current medications as directed. Please refer to the Current Medication list given to you today.  *If you need a refill on your cardiac medications before your next appointment, please call your pharmacy*   Lab Work: Troponin T today  If you have labs (blood work) drawn today and your tests are completely normal, you will receive your results only by: MyChart Message (if you have MyChart) OR A paper copy in the mail If you have any lab test that is abnormal or we need to change your treatment, we will call you to review the results.   Testing/Procedures: None ordered.    Follow-Up: At [facility], you and your health needs are our priority.  As part of our continuing mission to provide you with exceptional heart care, we have created designated Provider Care Teams.  These Care Teams include your primary Cardiologist (physician) and Advanced Practice Providers (APPs -  Physician Assistants and Nurse Practitioners) who all work together to provide you with the care you need, when you need it.  We recommend signing up for the patient portal called "MyChart".  Sign up information is provided on this After Visit Summary.  MyChart is used to connect with patients for Virtual Visits (Telemedicine).  Patients are able to view lab/test results, encounter notes, upcoming appointments, etc.  Non-urgent messages can be sent to your provider as well.   To learn more about what you can do with MyChart, go to [email or web address].    Your next appointment:   12 months with Dr Graciela Husbands

## 2022-07-20 NOTE — Progress Notes (Signed)
Patient Care Team: Joaquim Nam, MD as PCP - General (Family Medicine) Wendall Stade, MD as PCP - Cardiology (Cardiology) Duke Salvia, MD as PCP - Electrophysiology (Cardiology) Kathyrn Sheriff, [facility] (Inactive) as Pharmacist (Pharmacist) All  HPI  Aaron Thompson is a 62 y.o. male Seen in followup for CRT- ICD implanted for nonischemic cardiomyopathy and left bundle branch block.  He was noted on  his device to have atrial fibrillation and he was started on Rivaroxaban for stroke reduction prophylaxis   Chose to stop his anticoagulation and to return to work. March 2016 he had ICD shocks associated with very rapid atrial fibrillation.  Rx w dofetilide therapy  And Rivaroxaban; underwent catheter ablation of his atrial fibrillation (JA) 9/1 The patient denies    nocturnal dyspnea, orthopnea or peripheral edema.  There have been no palpitations, lightheadedness or syncope.  Minimal  shortness of breath, Episode of prolonged chest pain for about an hour yesterday, nonradiating.    Unfortunately his wife has intercurrently been diagnosed with heart failure as well.  His/24 point DATE TEST  EF    9/17 Echo  40 %    11/18  Echo   20-25%      1/20   Echo  25-30%    3/22 Echo  25-30%         Date Cr K Hgb Mg  3/21 0.69 4.7 15.7    5/21 0.74 4.7 15.9   6/22 0.66 4.3  2.5  12/22 0.80 4.7    5/23} 0.81 4.5 15.6   6/24 0.79 4.4 16.1       Past Medical History:  Diagnosis Date   AICD (automatic cardioverter/defibrillator) present    Allergy 1968   Anxiety state, unspecified    Calculus of kidney 1981   "passed it on my own"   Cataract 2020   Chronic systolic CHF (congestive heart failure) (HCC)    Dermatophytosis of the body    DJD (degenerative joint disease)    Esophageal reflux    Hx of colonic polyps    Hypertension    LBBB (left bundle branch block)    intermittent   Non-ischemic cardiomyopathy (HCC)    a. cath 2013: minor nonobstructive CAD.    OSA (obstructive sleep apnea)    he can't tolerate CPAP.    Osteoarthrosis, unspecified whether generalized or localized, hand    Other chronic nonalcoholic liver disease    fatty liver   PAF (paroxysmal atrial fibrillation) (HCC)    a. s/p DCCV 6/11; previously on Pradaxa;  b. Event Monitor 2012->No PAF;  c. 09/2011 s/p DCCV ->Xarelto started. d. 03/2014: inappropriate ICD shocks for AF-RVR, started on Tikosyn, Xarelto restarted.   Type II diabetes mellitus (HCC)    Unspecified hearing loss    no hearing aid    Past Surgical History:  Procedure Laterality Date   Abdominal US  01/2007   Fatty liver, no gallstones   APPENDECTOMY     ATRIAL TACH ABLATION  09/2015   BI-VENTRICULAR IMPLANTABLE CARDIOVERTER DEFIBRILLATOR [name] 02/14/2013   STJ CRTD implanted by Dr Graciela Husbands   BILATERAL KNEE ARTHROSCOPY     BIV ICD GENERATOR CHANGEOUT [name] 06/06/2019   Procedure: BIV ICD GENERATOR CHANGEOUT;  Surgeon: Duke Salvia, MD;  Location: [facility] INVASIVE CV LAB;  Service: Cardiovascular;  Laterality: [name];   CARDIOVERSION  09/29/2011   Procedure: CARDIOVERSION;  Surgeon: Vesta Mixer, MD;  Location: [facility] OR;  Service: Cardiovascular;  Laterality: [name];   COLONOSCOPY WITH PROPOFOL [name] 10/26/2016   Procedure: COLONOSCOPY WITH PROPOFOL;  Surgeon: Hilarie Fredrickson, MD;  Location: [facility] ENDOSCOPY;  Service: Endoscopy;  Laterality: [name];   DOPPLER ECHOCARDIOGRAPHY  11/2009   Decreased EF of 35-40%   ELECTROPHYSIOLOGIC STUDY [name] 10/14/2015   Procedure: Atrial Fibrillation Ablation;  Surgeon: Hillis Range, MD;  Location: [facility] INVASIVE CV LAB;  Service: Cardiovascular;  Laterality: [name];   EYE SURGERY  December 2020   LEFT HEART CATHETERIZATION WITH CORONARY ANGIOGRAM [name] 08/18/2011   Procedure: LEFT HEART CATHETERIZATION WITH CORONARY ANGIOGRAM;  Surgeon: Peter M Swaziland, MD;  Location: [facility] CATH LAB;  Service: Cardiovascular;  Laterality: [name];   NECK SURGERY     plate/fusion   Stress Cardiolite  08/2005   Normal, EF 42%    UPPER GASTROINTESTINAL ENDOSCOPY  08/2006   GERD    Current Outpatient Medications  Medication Sig Dispense Refill   acetaminophen (TYLENOL) 325 MG tablet Take 650 mg by mouth every 6 (six) hours as needed for mild pain or moderate pain.     ALPRAZolam (XANAX) 0.25 MG tablet TAKE 1 TABLET BY MOUTH DAILY AS NEEDED FOR ANXIETY 30 tablet 2   Blood Glucose Monitoring Suppl (ONE TOUCH ULTRA 2) w/Device KIT Use daily to check sugars as needed. Dx E11.9 1 kit 0   carvedilol (COREG) 25 MG tablet TAKE 2 TABLETS(50 MG) BY MOUTH TWICE DAILY WITH A MEAL 360 tablet 0   diltiazem (CARDIZEM) 30 MG tablet TAKE 1 TABLET BY MOUTH AS NEEDED, MAY REPEAT DOSE IN 4 HOURS ONCE IF NEEDED 90 tablet 0   dofetilide (TIKOSYN) 500 MCG capsule TAKE 1 CAPSULE(500 MCG) BY MOUTH TWICE DAILY 180 capsule 1   empagliflozin (JARDIANCE) 25 MG TABS tablet TAKE 1 TABLET BY MOUTH EVERY DAY 90 tablet 0   furosemide (LASIX) 40 MG tablet Take one tablet by mouth every Friday. 12 tablet 3   glipiZIDE (GLUCOTROL XL) 10 MG 24 hr tablet TAKE 1 TABLET(10 MG) BY MOUTH DAILY WITH BREAKFAST 30 tablet 12   loratadine (CLARITIN) 10 MG tablet Take 10 mg by mouth daily as needed for allergies.     Magnesium Oxide 400 MG CAPS Take 1 capsule (400 mg total) by mouth daily. 90 capsule 3   metFORMIN (GLUCOPHAGE-XR) 500 MG 24 hr tablet TAKE 3 TABLETS(1500 MG) BY MOUTH DAILY WITH BREAKFAST 270 tablet 0   Multiple Vitamin (MULTIVITAMIN) tablet Take 1 tablet by mouth daily.     omeprazole (PRILOSEC) 40 MG capsule TAKE 1 CAPSULE(40 MG) BY MOUTH DAILY 90 capsule 3   ONETOUCH ULTRA test strip CHECK GLUCOSE TWICE DAILY AND AS NEEDED 100 strip 1   potassium chloride SA (KLOR-CON M) 20 MEQ tablet Take 2 tablets (40 mEq total) by mouth daily.     pravastatin (PRAVACHOL) 10 MG tablet Take 1 tablet (10 mg total) by mouth every Monday, Wednesday, and Friday.     sacubitril-valsartan (ENTRESTO) 97-103 MG TAKE 1 TABLET BY MOUTH TWICE DAILY 180 tablet 3    spironolactone (ALDACTONE) 25 MG tablet TAKE 1 TABLET(25 MG) BY MOUTH DAILY 90 tablet 0   traMADol (ULTRAM) 50 MG tablet TAKE 1 TABLET(50 MG) BY MOUTH EVERY 12 HOURS AS NEEDED FOR PAIN 60 tablet 2   XARELTO 20 MG TABS tablet TAKE 1 TABLET(20 MG) BY MOUTH DAILY WITH SUPPER 30 tablet 5   No current facility-administered medications for this visit.    Allergies  Allergen Reactions   Atorvastatin     Muscle pain  Review of Systems negative except from HPI and PMH  Physical Exam  BP 104/72   Pulse 94   Ht 6\' 4"  (1.93 m)   Wt 285 lb 9.6 oz (129.5 kg)   SpO2 96%   BMI 34.76 kg/m  ,Well developed and well nourished in no acute distress HENT normal Neck suppl Clear Device pocket well healed; without hematoma or erythema.  There is no tethering  Regular rate and rhythm, no  gallop No murmur Abd-soft with active BS No Clubbing cyanosis tr edema Skin-warm and dry A & Oriented  Grossly normal sensory and motor function  ECG sinus rhythm with P synchronous pacing at 89 Intervals 13/14/43 Negative QRS lead I and RS lead V1  Device function is normal. Programming changes none See Paceart for details    Assessment and Plan:  Nonischemic cardiomyopathy  Atrial tachycardia  Congestive heart failure- chronic-systolic  PVC RBBB inferior axis   Hypertension  Implantable defibrillator-CRT   Sleep disorder breathing  Atrial fibrillation s/p PVI-JA 2017  Inappropriate ICD discharges  Chest pain-atypical  Continue guideline directed therapy for his nonischemic myopathy including Entresto Jardiance carvedilol and spironolactone  No interval sustained atrial arrhythmias.  Continue dofetilide.  Surveillance laboratories were normal.  PVCs remain about 6 or 7% based on his device.  This is likely an underestimate.  Continue dofetilide, and the magnesium  Blood pressure is well-controlled on his guideline directed therapy  Apical chest pain.  It was so the fact that he  has a nonischemic myopathy, we will check a troponin just to make sure

## 2022-07-26 ENCOUNTER — Other Ambulatory Visit: Payer: Self-pay | Admitting: Family Medicine

## 2022-07-31 NOTE — Progress Notes (Signed)
ADVANCED HF CLINIC NOTE  Referring Physician: Dr. Eden Emms Primary Cardiologist: Dr. Eden Emms  HPI:  Aaron Thompson is a 62 y.o. male with PAF s/p AF ablation , HTN, morbid obesity, OSA unable to tolerate CPAP, DM, LBBB and chronic systolic HF (EF 20-25%) due to NICM s/p STJ CRT-D (cath 2013 minimal CAD)  referred by Dr. Eden Emms for HF evaluation   Diagnosed with systolic HF in 2013. Cath at that time minimal CAD. Had STJ CRT-D placed.   Presented to ER with new-onset AF 7/17.Had DCCV in ER.  Admitted the same month with ERAF. Felt to to have both AFib and ATach.  Subsequently seen by Dr. Johney Frame and had afib ablation in 9/17. EF 40% at that time. Subsequently had recurrent AF with RVR leading to ICD shocks. Placed back on tikosyn and anticoagulation.   Echo 12/14/16 EF 20-25% Echo 1/20 EF 25-30%   CRT-D gen change 5/21  Saw Dr. Graciela Husbands in 1/22 and being evaluated for symptomatic PVCs. On ICD interrogation in 2/22 burden was 1.3%.  He saw Dr. Graciela Husbands in 6/24 and device interrogation showed 6-7% PVCs. No recent AF/AT.   Here for f/u. Playing golf. Working in yard. Goes to [employer] 3x/week - workouts and walks 1 mile  No significant SOB, edema, orthopnea or PND. Occasional episodes of chest tenderness. Will take prn diltiazem for symptomatic PVCs. Only takes rarely.   ICD interrogation in clinic today. No Vt/VF.  AF burden < 1% PVC 2.5 % BiV pacing 95% Volume status dry. Personally reviewed    Past Medical History:  Diagnosis Date   AICD (automatic cardioverter/defibrillator) present    Allergy 1968   Anxiety state, unspecified    Calculus of kidney 1981   "passed it on my own"   Cataract 2020   Chronic systolic CHF (congestive heart failure) (HCC)    Dermatophytosis of the body    DJD (degenerative joint disease)    Esophageal reflux    Hx of colonic polyps    Hypertension    LBBB (left bundle branch block)    intermittent   Non-ischemic cardiomyopathy (HCC)    a. cath  2013: minor nonobstructive CAD.   OSA (obstructive sleep apnea)    he can't tolerate CPAP.    Osteoarthrosis, unspecified whether generalized or localized, hand    Other chronic nonalcoholic liver disease    fatty liver   PAF (paroxysmal atrial fibrillation) (HCC)    a. s/p DCCV 6/11; previously on Pradaxa;  b. Event Monitor 2012->No PAF;  c. 09/2011 s/p DCCV ->Xarelto started. d. 03/2014: inappropriate ICD shocks for AF-RVR, started on Tikosyn, Xarelto restarted.   Type II diabetes mellitus (HCC)    Unspecified hearing loss    no hearing aid    Current Outpatient Medications  Medication Sig Dispense Refill   acetaminophen (TYLENOL) 325 MG tablet Take 650 mg by mouth every 6 (six) hours as needed for mild pain or moderate pain.     ALPRAZolam (XANAX) 0.25 MG tablet TAKE 1 TABLET BY MOUTH DAILY AS NEEDED FOR ANXIETY 30 tablet 2   Blood Glucose Monitoring Suppl (ONE TOUCH ULTRA 2) w/Device KIT Use daily to check sugars as needed. Dx E11.9 1 kit 0   carvedilol (COREG) 25 MG tablet TAKE 2 TABLETS(50 MG) BY MOUTH TWICE DAILY WITH A MEAL 360 tablet 0   diltiazem (CARDIZEM) 30 MG tablet TAKE 1 TABLET BY MOUTH AS NEEDED, MAY REPEAT DOSE IN 4 HOURS ONCE IF NEEDED 90 tablet 0  dofetilide (TIKOSYN) 500 MCG capsule TAKE 1 CAPSULE(500 MCG) BY MOUTH TWICE DAILY 180 capsule 2   furosemide (LASIX) 40 MG tablet Take one tablet by mouth every Friday. 12 tablet 3   glipiZIDE (GLUCOTROL XL) 10 MG 24 hr tablet TAKE 1 TABLET(10 MG) BY MOUTH DAILY WITH BREAKFAST 30 tablet 12   JARDIANCE 25 MG TABS tablet TAKE 1 TABLET BY MOUTH EVERY DAY 90 tablet 1   loratadine (CLARITIN) 10 MG tablet Take 10 mg by mouth daily as needed for allergies.     Magnesium Oxide 400 MG CAPS Take 1 capsule (400 mg total) by mouth daily. 90 capsule 3   metFORMIN (GLUCOPHAGE-XR) 500 MG 24 hr tablet TAKE 3 TABLETS(1500 MG) BY MOUTH DAILY WITH BREAKFAST 270 tablet 0   Multiple Vitamin (MULTIVITAMIN) tablet Take 1 tablet by mouth daily.      omeprazole (PRILOSEC) 40 MG capsule TAKE 1 CAPSULE(40 MG) BY MOUTH DAILY 90 capsule 3   ONETOUCH ULTRA test strip CHECK GLUCOSE TWICE DAILY AND AS NEEDED 100 strip 1   potassium chloride SA (KLOR-CON M) 20 MEQ tablet Take 2 tablets (40 mEq total) by mouth daily.     pravastatin (PRAVACHOL) 10 MG tablet Take 1 tablet (10 mg total) by mouth every Monday, Wednesday, and Friday.     sacubitril-valsartan (ENTRESTO) 97-103 MG TAKE 1 TABLET BY MOUTH TWICE DAILY 180 tablet 3   spironolactone (ALDACTONE) 25 MG tablet TAKE 1 TABLET(25 MG) BY MOUTH DAILY 90 tablet 0   traMADol (ULTRAM) 50 MG tablet TAKE 1 TABLET(50 MG) BY MOUTH EVERY 12 HOURS AS NEEDED FOR PAIN 60 tablet 2   XARELTO 20 MG TABS tablet TAKE 1 TABLET(20 MG) BY MOUTH DAILY WITH SUPPER 30 tablet 5   No current facility-administered medications for this encounter.    Allergies  Allergen Reactions   Atorvastatin     Muscle pain       Social History   Socioeconomic History   Marital status: Married    Spouse name: Not on file   Number of children: 2   Years of education: Not on file   Highest education level: Not on file  Occupational History   Occupation: PAINTER    Employer: UNEMPLOYED  Tobacco Use   Smoking status: Never    Passive exposure: Past   Smokeless tobacco: Former    Types: Snuff    Quit date: 10/12/2004   Tobacco comments:    02/14/2013 "quit snuff in 2006"  Vaping Use   Vaping Use: Never used  Substance and Sexual Activity   Alcohol use: No   Drug use: No   Sexual activity: Yes    Birth control/protection: None  Other Topics Concern   Not on file  Social History Narrative   Lives in [place] with wife.  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   Married 1990   Social Determinants of Health   Financial Resource Strain: Low Risk  (07/08/2022)   Overall Financial Resource Strain (CARDIA)    Difficulty of Paying Living Expenses: Not hard at all  Food Insecurity: No Food Insecurity (07/08/2022)    Hunger Vital Sign    Worried About Running Out of Food in the Last Year: Never true    Ran Out of Food in the Last Year: Never true  Transportation Needs: No Transportation Needs (07/08/2022)   PRAPARE - Administrator, Civil Service (Medical): No    Lack of Transportation (Non-Medical): No  Physical Activity: Sufficiently Active (07/08/2022)  Exercise Vital Sign    Days of Exercise per Week: 3 days    Minutes of Exercise per Session: 60 min  Stress: No Stress Concern Present (07/08/2022)   [employer] of Occupational Health - Occupational Stress Questionnaire    Feeling of Stress : Not at all  Social Connections: Socially Integrated (07/08/2022)   Social Connection and Isolation Panel [NHANES]    Frequency of Communication with Friends and Family: More than three times a week    Frequency of Social Gatherings with Friends and Family: More than three times a week    Attends Religious Services: More than 4 times per year    Active Member of [employer] or Organizations: Yes    Attends Banker Meetings: More than 4 times per year    Marital Status: Married  Catering manager Violence: Not At Risk (07/08/2022)   Humiliation, Afraid, Rape, and Kick questionnaire    Fear of Current or Ex-Partner: No    Emotionally Abused: No    Physically Abused: No    Sexually Abused: No      Family History  Problem Relation Age of Onset   Hypertension Mother    Diabetes Mother    Heart failure Mother        Died @ 64   Arthritis Mother    Heart disease Mother    Hypertension Father    Heart failure Father        Died @ 61   Prostate cancer Father    Arthritis Father    Hearing loss Father    Heart disease Father    Other Sister    Arthritis Sister    Multiple myeloma Sister    Leukemia Sister    Arthritis Sister    Cancer Sister    Arthritis Sister    Colon polyps Sister    Arthritis Sister    Diabetes Brother    Kidney disease Brother    Arthritis Brother     Diabetes Brother    Prostate cancer Brother    Arthritis Brother    Diabetes Brother    Arthritis Brother    Cirrhosis Maternal Uncle        alcohol related   Stroke Paternal Grandmother    Colon cancer Paternal Grandmother    Heart attack Paternal Grandfather    Esophageal cancer Neg Hx    Stomach cancer Neg Hx    Pancreatic cancer Neg Hx     Vitals:   08/03/22 0854  BP: 110/70  Pulse: 80  SpO2: 97%  Weight: 129.2 kg (284 lb 12.8 oz)     PHYSICAL EXAM: General:  Well appearing. No resp difficulty HEENT: normal Neck: supple. no JVD. Carotids 2+ bilat; no bruits. No lymphadenopathy or thryomegaly appreciated. Cor: PMI nondisplaced. Regular rate & rhythm. No rubs, gallops or murmurs. Lungs: clear Abdomen: soft, nontender, nondistended. No hepatosplenomegaly. No bruits or masses. Good bowel sounds. Extremities: no cyanosis, clubbing, rash, edema Neuro: alert & orientedx3, cranial nerves grossly intact. moves all 4 extremities w/o difficulty. Affect pleasant    ECG: NSR with v-pacing 76 QTS 513 ms (QRS is 132 so JT is 383)Personally reviewed  ASSESSMENT & PLAN:  1. Chronic systolic failure due to NICM s/p STJ CRT-D - cath 2013 non-obstructive CAD - Echo 11/18 EF 20-25%  - Echo 1/20 EF 25-30% - Echo 3/22 EF 25-30% RV mildly down.  - Doing well NYHA I - Continue lasix 40 + kcl 20 on every Friday - ICD interrogated  as above - On goal-dose HF meds: Entresto 97/103 bid, carvedilol 25 bid, spiro 25 daily. + Jardiance 25mg  daily  - Due for repeat echo   2. PAF - s/p AF ablation - maintaining NSR on Tikosyn. Continue Xarelto for [facility] - Followed by Dr. Graciela Husbands - QT mildly prolonged but JT ok (ok no change from previous) - No bleeding on Xarelto   3. OSA - unable to tolerate CPAP - weight loss recommended.  - Refer for GLP1RA  4. DM2  - Continue Jardiance 25 - continue statin and Entresto - Consider GLP1RA  5. PVCs - ICD interrogation with 1.3% burden 2/22 - In  6/24 PVC burden 6-7% on ICD interrogation with Dr. Graciela Husbands - Doubt PVC myopathy as EF remained low in the setting of 1-2% PVC burden.  - PVC burden 2.5% today   Arvilla Meres, MD  9:19 AM    .

## 2022-08-03 ENCOUNTER — Encounter (HOSPITAL_COMMUNITY): Payer: Self-pay | Admitting: Internal Medicine

## 2022-08-03 ENCOUNTER — Ambulatory Visit (HOSPITAL_COMMUNITY)
Admission: RE | Admit: 2022-08-03 | Discharge: 2022-08-03 | Disposition: A | Discharge status: Home / Self Care | Payer: PPO | Source: Ambulatory Visit | Attending: Internal Medicine | Admitting: Internal Medicine

## 2022-08-03 VITALS — BP 110/70 | HR 80 | Wt 284.8 lb

## 2022-08-03 DIAGNOSIS — I110 Hypertensive heart disease with heart failure: Secondary | ICD-10-CM | POA: Insufficient documentation

## 2022-08-03 DIAGNOSIS — Z87891 Personal history of nicotine dependence: Secondary | ICD-10-CM | POA: Diagnosis not present

## 2022-08-03 DIAGNOSIS — I480 Paroxysmal atrial fibrillation: Secondary | ICD-10-CM | POA: Insufficient documentation

## 2022-08-03 DIAGNOSIS — I493 Ventricular premature depolarization: Secondary | ICD-10-CM | POA: Diagnosis not present

## 2022-08-03 DIAGNOSIS — Z8249 Family history of ischemic heart disease and other diseases of the circulatory system: Secondary | ICD-10-CM | POA: Insufficient documentation

## 2022-08-03 DIAGNOSIS — E119 Type 2 diabetes mellitus without complications: Secondary | ICD-10-CM | POA: Insufficient documentation

## 2022-08-03 DIAGNOSIS — Z7984 Long term (current) use of oral hypoglycemic drugs: Secondary | ICD-10-CM | POA: Insufficient documentation

## 2022-08-03 DIAGNOSIS — G4733 Obstructive sleep apnea (adult) (pediatric): Secondary | ICD-10-CM | POA: Insufficient documentation

## 2022-08-03 DIAGNOSIS — I428 Other cardiomyopathies: Secondary | ICD-10-CM | POA: Diagnosis not present

## 2022-08-03 DIAGNOSIS — I5022 Chronic systolic (congestive) heart failure: Secondary | ICD-10-CM | POA: Diagnosis not present

## 2022-08-03 DIAGNOSIS — Z833 Family history of diabetes mellitus: Secondary | ICD-10-CM | POA: Diagnosis not present

## 2022-08-03 DIAGNOSIS — Z4502 Encounter for adjustment and management of automatic implantable cardiac defibrillator: Secondary | ICD-10-CM | POA: Diagnosis not present

## 2022-08-03 DIAGNOSIS — Z7901 Long term (current) use of anticoagulants: Secondary | ICD-10-CM | POA: Insufficient documentation

## 2022-08-03 DIAGNOSIS — I2510 Atherosclerotic heart disease of native coronary artery without angina pectoris: Secondary | ICD-10-CM | POA: Insufficient documentation

## 2022-08-03 DIAGNOSIS — Z95810 Presence of automatic (implantable) cardiac defibrillator: Secondary | ICD-10-CM | POA: Insufficient documentation

## 2022-08-03 DIAGNOSIS — Z79899 Other long term (current) drug therapy: Secondary | ICD-10-CM | POA: Diagnosis not present

## 2022-08-03 NOTE — Patient Instructions (Signed)
There has been no changes to your medications.  Your physician has requested that you have an echocardiogram. Echocardiography is a painless test that uses sound waves to create images of your heart. It provides your doctor with information about the size and shape of your heart and how well your heart's chambers and valves are working. This procedure takes approximately one hour. There are no restrictions for this procedure. Please do NOT wear cologne, perfume, aftershave, or lotions (deodorant is allowed). Please arrive 15 minutes prior to your appointment time.  Your physician recommends that you schedule a follow-up appointment in: 1 year ( July 2025) ** please call the office in May 2025 to arrange your follow up appointment. **  If you have any questions or concerns before your next appointment please send [place] a message through [place] or call our office at [phone].    TO LEAVE A MESSAGE FOR THE NURSE SELECT OPTION 2, PLEASE LEAVE A MESSAGE INCLUDING: YOUR NAME DATE OF BIRTH CALL BACK NUMBER REASON FOR CALL**this is important as we prioritize the call backs  YOU WILL RECEIVE A CALL BACK THE SAME DAY AS LONG AS YOU CALL BEFORE 4:00 PM  At the [facility], you and your health needs are our priority. As part of our continuing mission to provide you with exceptional heart care, we have created designated Provider Care Teams. These Care Teams include your primary Cardiologist (physician) and Advanced Practice Providers (APPs- Physician Assistants and Nurse Practitioners) who all work together to provide you with the care you need, when you need it.   You may see any of the following providers on your designated Care Team at your next follow up: Dr Arvilla Meres Dr Marca Ancona Dr. Marcos Eke, NP Robbie Lis, [place] [facility] [place], [place] Brynda Peon, NP Karle Plumber, PharmD   Please be sure to bring in all your medications  bottles to every appointment.    Thank you for choosing [facility]

## 2022-08-03 NOTE — Addendum Note (Signed)
Encounter addended by: Linda Hedges, RN on: 08/03/2022 9:32 AM  Actions taken: Order list changed, Diagnosis association updated, Clinical Note Signed

## 2022-08-09 ENCOUNTER — Encounter: Payer: Self-pay | Admitting: Internal Medicine

## 2022-08-14 ENCOUNTER — Other Ambulatory Visit: Payer: Self-pay | Admitting: Family Medicine

## 2022-08-17 ENCOUNTER — Other Ambulatory Visit: Payer: Self-pay | Admitting: Family Medicine

## 2022-08-30 ENCOUNTER — Ambulatory Visit (HOSPITAL_COMMUNITY)
Admission: RE | Admit: 2022-08-30 | Discharge: 2022-08-30 | Disposition: A | Discharge status: Home / Self Care | Payer: PPO | Source: Ambulatory Visit | Attending: Family Medicine | Admitting: Family Medicine

## 2022-08-30 DIAGNOSIS — I429 Cardiomyopathy, unspecified: Secondary | ICD-10-CM | POA: Diagnosis not present

## 2022-08-30 DIAGNOSIS — E785 Hyperlipidemia, unspecified: Secondary | ICD-10-CM | POA: Insufficient documentation

## 2022-08-30 DIAGNOSIS — E119 Type 2 diabetes mellitus without complications: Secondary | ICD-10-CM | POA: Diagnosis not present

## 2022-08-30 DIAGNOSIS — I5022 Chronic systolic (congestive) heart failure: Secondary | ICD-10-CM | POA: Diagnosis not present

## 2022-08-30 LAB — ECHOCARDIOGRAM COMPLETE
AR max vel: 3.82 cm2
AV Area VTI: 4.53 cm2
AV Area mean vel: 3.61 cm2
AV Mean grad: 2 mmHg
AV Peak grad: 4.2 mmHg
Ao pk vel: 1.03 m/s
Area-P 1/2: 3.42 cm2
S' Lateral: 5.3 cm

## 2022-08-31 ENCOUNTER — Other Ambulatory Visit: Payer: Self-pay | Admitting: Internal Medicine

## 2022-09-01 ENCOUNTER — Ambulatory Visit: Payer: HMO

## 2022-09-01 DIAGNOSIS — I428 Other cardiomyopathies: Secondary | ICD-10-CM | POA: Diagnosis not present

## 2022-09-01 LAB — CUP PACEART REMOTE DEVICE CHECK
Battery Remaining Longevity: 43 mo
Battery Remaining Percentage: 51 %
Battery Voltage: 2.95 V
Brady Statistic AP VP Percent: 1.9 %
Brady Statistic AP VS Percent: 1 %
Brady Statistic AS VP Percent: 93 %
Brady Statistic AS VS Percent: 2.5 %
Brady Statistic RA Percent Paced: 1 %
Date Time Interrogation Session: 20240807020016
HighPow Impedance: 80 Ohm
HighPow Impedance: 80 Ohm
Implantable Lead Connection Status: 753985
Implantable Lead Connection Status: 753985
Implantable Lead Connection Status: 753985
Implantable Lead Implant Date: 20150121
Implantable Lead Implant Date: 20150121
Implantable Lead Implant Date: 20150121
Implantable Lead Location: 753858
Implantable Lead Location: 753859
Implantable Lead Location: 753860
Implantable Lead Model: 7122
Implantable Pulse Generator Implant Date: 20210512
Lead Channel Impedance Value: 450 Ohm
Lead Channel Impedance Value: 480 Ohm
Lead Channel Impedance Value: 540 Ohm
Lead Channel Pacing Threshold Amplitude: 0.875 V
Lead Channel Pacing Threshold Amplitude: 1.125 V
Lead Channel Pacing Threshold Amplitude: 1.375 V
Lead Channel Pacing Threshold Pulse Width: 0.4 ms
Lead Channel Pacing Threshold Pulse Width: 0.4 ms
Lead Channel Pacing Threshold Pulse Width: 0.5 ms
Lead Channel Sensing Intrinsic Amplitude: 10.2 mV
Lead Channel Sensing Intrinsic Amplitude: 4.8 mV
Lead Channel Setting Pacing Amplitude: 1.875
Lead Channel Setting Pacing Amplitude: 2.125
Lead Channel Setting Pacing Amplitude: 2.375
Lead Channel Setting Pacing Pulse Width: 0.4 ms
Lead Channel Setting Pacing Pulse Width: 0.5 ms
Lead Channel Setting Sensing Sensitivity: 0.5 mV
Pulse Gen Serial Number: 9890691

## 2022-09-01 NOTE — Progress Notes (Signed)
Primary Cardiologist: Dr. Eden Emms CHF:  [place] EP:  Graciela Husbands   HPI:  Aaron Thompson is a 62 y.o. male with PAF s/p AF ablation , HTN, morbid obesity, OSA unable to tolerate CPAP, DM, LBBB and chronic systolic HF (EF 20-25%) due to NICM s/p STJ CRT-D (cath 2013 minimal CAD)     Diagnosed with systolic HF in 2013. Cath at that time minimal CAD. Had STJ CRT-D placed.   Presented to ER with new-onset AF 7/17.Had DCCV in ER.  Admitted the same month with ERAF. Felt to to have both AFib and ATach.  Subsequently seen by Dr. Johney Frame and had afib ablation in 9/17. EF 40% at that time. Subsequently had recurrent AF with RVR leading to ICD shocks. Placed back on tikosyn and anticoagulation.   Echo 12/14/16 EF 20-25% Echo 1/20 EF 25-30%  Echo 08/30/22 improved 30-35%   CRT-D gen change 5/21  Saw Dr. Graciela Husbands in 1/22 and being evaluated for symptomatic PVCs. On ICD interrogation in 2/22 burden was 1.3%.  He saw Dr. Graciela Husbands in 6/24 and device interrogation showed 6-7% PVCs. No recent AF/AT.   Here for f/u. Playing golf. Working in yard. Goes to [employer] 3x/week - workouts and walks 1 mile  No significant SOB, edema, orthopnea or PND. Occasional episodes of chest tenderness. Will take prn diltiazem for symptomatic PVCs. Only takes rarely.   ICD interrogation 09/01/22  No Vt/VF.  AF burden < 1% PVC 2.5 % BiV pacing 95% Volume status dry. Personally reviewed  Active doing yard work    Past Medical History:  Diagnosis Date   AICD (automatic cardioverter/defibrillator) present    Allergy 1968   Anxiety state, unspecified    Calculus of kidney 1981   "passed it on my own"   Cataract 2020   Chronic systolic CHF (congestive heart failure) (HCC)    Dermatophytosis of the body    DJD (degenerative joint disease)    Esophageal reflux    Hx of colonic polyps    Hypertension    LBBB (left bundle branch block)    intermittent   Non-ischemic cardiomyopathy (HCC)    a. cath 2013: minor  nonobstructive CAD.   OSA (obstructive sleep apnea)    he can't tolerate CPAP.    Osteoarthrosis, unspecified whether generalized or localized, hand    Other chronic nonalcoholic liver disease    fatty liver   PAF (paroxysmal atrial fibrillation) (HCC)    a. s/p DCCV 6/11; previously on Pradaxa;  b. Event Monitor 2012->No PAF;  c. 09/2011 s/p DCCV ->Xarelto started. d. 03/2014: inappropriate ICD shocks for AF-RVR, started on Tikosyn, Xarelto restarted.   Type II diabetes mellitus (HCC)    Unspecified hearing loss    no hearing aid    Current Outpatient Medications  Medication Sig Dispense Refill   acetaminophen (TYLENOL) 325 MG tablet Take 650 mg by mouth every 6 (six) hours as needed for mild pain or moderate pain.     ALPRAZolam (XANAX) 0.25 MG tablet TAKE 1 TABLET BY MOUTH DAILY AS NEEDED FOR ANXIETY 30 tablet 2   carvedilol (COREG) 25 MG tablet TAKE 2 TABLETS(50 MG) BY MOUTH TWICE DAILY WITH A MEAL 360 tablet 3   dofetilide (TIKOSYN) 500 MCG capsule TAKE 1 CAPSULE(500 MCG) BY MOUTH TWICE DAILY 180 capsule 2   furosemide (LASIX) 40 MG tablet Take one tablet by mouth every Friday. 12 tablet 3   glipiZIDE (GLUCOTROL XL) 10 MG 24 hr tablet TAKE 1 TABLET(10 MG) BY  MOUTH DAILY WITH BREAKFAST 30 tablet 12   JARDIANCE 25 MG TABS tablet TAKE 1 TABLET BY MOUTH EVERY DAY 90 tablet 1   loratadine (CLARITIN) 10 MG tablet Take 10 mg by mouth daily as needed for allergies.     Magnesium Oxide 400 MG CAPS Take 1 capsule (400 mg total) by mouth daily. 90 capsule 3   metFORMIN (GLUCOPHAGE-XR) 500 MG 24 hr tablet TAKE 3 TABLETS(1500 MG) BY MOUTH DAILY WITH BREAKFAST 270 tablet 1   Multiple Vitamin (MULTIVITAMIN) tablet Take 1 tablet by mouth daily.     omeprazole (PRILOSEC) 40 MG capsule TAKE 1 CAPSULE(40 MG) BY MOUTH DAILY 90 capsule 3   potassium chloride SA (KLOR-CON M) 20 MEQ tablet Take 2 tablets (40 mEq total) by mouth daily.     pravastatin (PRAVACHOL) 10 MG tablet Take 1 tablet (10 mg total) by  mouth every Monday, Wednesday, and Friday.     sacubitril-valsartan (ENTRESTO) 97-103 MG TAKE 1 TABLET BY MOUTH TWICE DAILY 180 tablet 3   spironolactone (ALDACTONE) 25 MG tablet TAKE 1 TABLET(25 MG) BY MOUTH DAILY 90 tablet 0   traMADol (ULTRAM) 50 MG tablet TAKE 1 TABLET(50 MG) BY MOUTH EVERY 12 HOURS AS NEEDED FOR PAIN 60 tablet 2   XARELTO 20 MG TABS tablet TAKE 1 TABLET(20 MG) BY MOUTH DAILY WITH SUPPER 30 tablet 5   Blood Glucose Monitoring Suppl (ONE TOUCH ULTRA 2) w/Device KIT Use daily to check sugars as needed. Dx E11.9 1 kit 0   diltiazem (CARDIZEM) 30 MG tablet TAKE 1 TABLET BY MOUTH AS NEEDED, MAY REPEAT DOSE IN 4 HOURS ONCE IF NEEDED (Patient not taking: Reported on 09/06/2022) 90 tablet 0   ONETOUCH ULTRA TEST test strip USE TO CHECK GLUCOSE TWICE DAILY AND AS NEEDED 100 strip 1   No current facility-administered medications for this visit.    Allergies  Allergen Reactions   Atorvastatin     Muscle pain       Social History   Socioeconomic History   Marital status: Married    Spouse name: Not on file   Number of children: 2   Years of education: Not on file   Highest education level: Not on file  Occupational History   Occupation: PAINTER    Employer: UNEMPLOYED  Tobacco Use   Smoking status: Never    Passive exposure: Past   Smokeless tobacco: Former    Types: Snuff    Quit date: 10/12/2004   Tobacco comments:    02/14/2013 "quit snuff in 2006"  Vaping Use   Vaping status: Never Used  Substance and Sexual Activity   Alcohol use: No   Drug use: No   Sexual activity: Yes    Birth control/protection: None  Other Topics Concern   Not on file  Social History Narrative   Lives in [place] with wife.  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   Married 1990   Social Determinants of Health   Financial Resource Strain: Low Risk  (07/08/2022)   Overall Financial Resource Strain (CARDIA)    Difficulty of Paying Living Expenses: Not hard at all  Food  Insecurity: No Food Insecurity (07/08/2022)   Hunger Vital Sign    Worried About Running Out of Food in the Last Year: Never true    Ran Out of Food in the Last Year: Never true  Transportation Needs: No Transportation Needs (07/08/2022)   PRAPARE - Administrator, Civil Service (Medical): No    Lack  of Transportation (Non-Medical): No  Physical Activity: Sufficiently Active (07/08/2022)   Exercise Vital Sign    Days of Exercise per Week: 3 days    Minutes of Exercise per Session: 60 min  Stress: No Stress Concern Present (07/08/2022)   [employer] of Occupational Health - Occupational Stress Questionnaire    Feeling of Stress : Not at all  Social Connections: Socially Integrated (07/08/2022)   Social Connection and Isolation Panel [NHANES]    Frequency of Communication with Friends and Family: More than three times a week    Frequency of Social Gatherings with Friends and Family: More than three times a week    Attends Religious Services: More than 4 times per year    Active Member of [employer] or Organizations: Yes    Attends Engineer, structural: More than 4 times per year    Marital Status: Married  Catering manager Violence: Not At Risk (07/08/2022)   Humiliation, Afraid, Rape, and Kick questionnaire    Fear of Current or Ex-Partner: No    Emotionally Abused: No    Physically Abused: No    Sexually Abused: No      Family History  Problem Relation Age of Onset   Hypertension Mother    Diabetes Mother    Heart failure Mother        Died @ 31   Arthritis Mother    Heart disease Mother    Hypertension Father    Heart failure Father        Died @ 13   Prostate cancer Father    Arthritis Father    Hearing loss Father    Heart disease Father    Other Sister    Arthritis Sister    Multiple myeloma Sister    Leukemia Sister    Arthritis Sister    Cancer Sister    Arthritis Sister    Colon polyps Sister    Arthritis Sister    Diabetes Brother     Kidney disease Brother    Arthritis Brother    Diabetes Brother    Prostate cancer Brother    Arthritis Brother    Diabetes Brother    Arthritis Brother    Cirrhosis Maternal Uncle        alcohol related   Stroke Paternal Grandmother    Colon cancer Paternal Grandmother    Heart attack Paternal Grandfather    Esophageal cancer Neg Hx    Stomach cancer Neg Hx    Pancreatic cancer Neg Hx     Vitals:   09/06/22 0850  BP: 118/80  Pulse: 88  Weight: 281 lb (127.5 kg)  Height: 6\' 4"  (1.93 m)      PHYSICAL EXAM:  Affect appropriate Healthy:  appears stated age HEENT: normal Neck supple with no adenopathy JVP normal no bruits no thyromegaly Lungs clear with no wheezing and good diaphragmatic motion Heart:  S1/S2 no murmur, no rub, gallop or click PMI enlarged AICD under left clavicle  Abdomen: benighn, BS positve, no tenderness, no AAA no bruit.  No HSM or HJR Distal pulses intact with no bruits No edema Neuro non-focal Skin warm and dry No muscular weakness     ECG: NSR with v-pacing 76 QTS 513 ms (QRS is 132 so JT is 383)Personally reviewed  ASSESSMENT & PLAN:  1. Chronic systolic failure due to NICM s/p STJ CRT-D - cath 2013 non-obstructive CAD - Echo 08/30/22 EF 30-35% no significant valve dx  - Doing well NYHA I -  Continue lasix 40 + kcl 20 on every Friday - ICD interrogated as above - On goal-dose HF meds: Entresto 97/103 bid, carvedilol 25 bid, spiro 25 daily. + Jardiance 25mg  daily   2. PAF - s/p AF ablation - maintaining NSR on Tikosyn. Continue Xarelto for [facility] - Followed by Dr. Graciela Husbands - QT mildly prolonged but JT ok (ok no change from previous) - No bleeding on Xarelto   3. OSA - unable to tolerate CPAP - weight loss recommended.  - Refer for GLP1RA  4. DM2  - Continue Jardiance 25 - continue statin and Entresto - Consider GLP1RA  5. PVCs - ICD interrogation with 1.3% burden 2/22 - In 6/24 PVC burden 6-7% on ICD interrogation with Dr.  Graciela Husbands - Doubt PVC myopathy as EF remained low in the setting of 1-2% PVC burden.  - PVC burden 2.5% 08/03/22    Charlton Haws, MD  9:08 AM    .

## 2022-09-06 ENCOUNTER — Ambulatory Visit: Payer: PPO | Attending: Cardiovascular Disease | Admitting: Cardiovascular Disease

## 2022-09-06 ENCOUNTER — Encounter: Payer: Self-pay | Admitting: Cardiovascular Disease

## 2022-09-06 VITALS — BP 118/80 | HR 88 | Ht 76.0 in | Wt 281.0 lb

## 2022-09-06 DIAGNOSIS — I480 Paroxysmal atrial fibrillation: Secondary | ICD-10-CM | POA: Diagnosis not present

## 2022-09-06 DIAGNOSIS — Z95810 Presence of automatic (implantable) cardiac defibrillator: Secondary | ICD-10-CM | POA: Diagnosis not present

## 2022-09-06 DIAGNOSIS — I493 Ventricular premature depolarization: Secondary | ICD-10-CM

## 2022-09-06 DIAGNOSIS — I5022 Chronic systolic (congestive) heart failure: Secondary | ICD-10-CM | POA: Diagnosis not present

## 2022-09-06 NOTE — Patient Instructions (Signed)
Medication Instructions:  Your physician recommends that you continue on your current medications as directed. Please refer to the Current Medication list given to you today.  *If you need a refill on your cardiac medications before your next appointment, please call your pharmacy*  Lab Work: If you have labs (blood work) drawn today and your tests are completely normal, you will receive your results only by: MyChart Message (if you have MyChart) OR A paper copy in the mail If you have any lab test that is abnormal or we need to change your treatment, we will call you to review the results.  Testing/Procedures: None ordered today.  Follow-Up: At [facility], you and your health needs are our priority.  As part of our continuing mission to provide you with exceptional heart care, we have created designated Provider Care Teams.  These Care Teams include your primary Cardiologist (physician) and Advanced Practice Providers (APPs -  Physician Assistants and Nurse Practitioners) who all work together to provide you with the care you need, when you need it.  We recommend signing up for the patient portal called "MyChart".  Sign up information is provided on this After Visit Summary.  MyChart is used to connect with patients for Virtual Visits (Telemedicine).  Patients are able to view lab/test results, encounter notes, upcoming appointments, etc.  Non-urgent messages can be sent to your provider as well.   To learn more about what you can do with MyChart, go to [email or web address].    Your next appointment:   6 month(s)  Provider:   Peter Nishan, MD      

## 2022-09-08 ENCOUNTER — Other Ambulatory Visit: Payer: Self-pay | Admitting: Family Medicine

## 2022-09-10 ENCOUNTER — Other Ambulatory Visit: Payer: Self-pay | Admitting: Internal Medicine

## 2022-09-10 NOTE — Telephone Encounter (Signed)
Refill request for TRAMADOL 50MG  TABLETS   LOV - 07/09/22 Next OV - not scheduled Last refill - 06/09/22 #60/2

## 2022-09-13 DIAGNOSIS — M7672 Peroneal tendinitis, left leg: Secondary | ICD-10-CM | POA: Diagnosis not present

## 2022-09-13 DIAGNOSIS — M7671 Peroneal tendinitis, right leg: Secondary | ICD-10-CM | POA: Diagnosis not present

## 2022-09-17 NOTE — Progress Notes (Signed)
Remote ICD transmission.   

## 2022-09-28 ENCOUNTER — Other Ambulatory Visit: Payer: Self-pay | Admitting: Family Medicine

## 2022-09-29 NOTE — Telephone Encounter (Signed)
Refill request for ALPRAZolam (XANAX) 0.25 MG tablet   LOV - 07/09/22 Next OV - not scheduled Last refill - 07/06/22 #30/2

## 2022-10-12 ENCOUNTER — Other Ambulatory Visit: Payer: Self-pay | Admitting: Internal Medicine

## 2022-10-18 ENCOUNTER — Ambulatory Visit (INDEPENDENT_AMBULATORY_CARE_PROVIDER_SITE_OTHER): Payer: PPO | Admitting: Internal Medicine

## 2022-10-18 ENCOUNTER — Encounter: Payer: Self-pay | Admitting: Internal Medicine

## 2022-10-18 VITALS — BP 100/70 | HR 80 | Temp 97.8°F | Ht 76.0 in | Wt 279.0 lb

## 2022-10-18 DIAGNOSIS — R109 Unspecified abdominal pain: Secondary | ICD-10-CM | POA: Insufficient documentation

## 2022-10-18 NOTE — Assessment & Plan Note (Signed)
I suspect he got this from doing ab machine at gym---crunching mid section with weights Discussed that there is nothing concerning here but that he would be better off doing planks for his core and abs

## 2022-10-18 NOTE — Progress Notes (Signed)
Subjective:    Patient ID: Aaron Thompson, male    DOB: July 29, 1960, 62 y.o.   MRN: [id number]  HPI Here due to left side pain  Notices a knot at the bottom of left side ribs---in the front (this was yesterday) There is some tenderness at both sides in that area  Has been working out in gym for the past year Doesn't remember any injury Then awoke in chair 5 days ago--and noticed some pain (had slid down a little)  Current Outpatient Medications on File Prior to Visit  Medication Sig Dispense Refill   acetaminophen (TYLENOL) 325 MG tablet Take 650 mg by mouth every 6 (six) hours as needed for mild pain or moderate pain.     ALPRAZolam (XANAX) 0.25 MG tablet TAKE 1 TABLET BY MOUTH DAILY AS NEEDED FOR ANXIETY 30 tablet 2   Blood Glucose Monitoring Suppl (ONE TOUCH ULTRA 2) w/Device KIT Use daily to check sugars as needed. Dx E11.9 1 kit 0   carvedilol (COREG) 25 MG tablet TAKE 2 TABLETS(50 MG) BY MOUTH TWICE DAILY WITH A MEAL 360 tablet 3   diltiazem (CARDIZEM) 30 MG tablet TAKE 1 TABLET BY MOUTH AS NEEDED, MAY REPEAT DOSE IN 4 HOURS ONCE IF NEEDED 90 tablet 0   dofetilide (TIKOSYN) 500 MCG capsule TAKE 1 CAPSULE(500 MCG) BY MOUTH TWICE DAILY 180 capsule 2   furosemide (LASIX) 40 MG tablet TAKE 1 TABLET BY MOUTH EVERY FRIDAY 12 tablet 3   glipiZIDE (GLUCOTROL XL) 10 MG 24 hr tablet TAKE 1 TABLET(10 MG) BY MOUTH DAILY WITH BREAKFAST 30 tablet 12   JARDIANCE 25 MG TABS tablet TAKE 1 TABLET BY MOUTH EVERY DAY 90 tablet 1   loratadine (CLARITIN) 10 MG tablet Take 10 mg by mouth daily as needed for allergies.     Magnesium Oxide 400 MG CAPS Take 1 capsule (400 mg total) by mouth daily. 90 capsule 3   metFORMIN (GLUCOPHAGE-XR) 500 MG 24 hr tablet TAKE 3 TABLETS(1500 MG) BY MOUTH DAILY WITH BREAKFAST 270 tablet 1   Multiple Vitamin (MULTIVITAMIN) tablet Take 1 tablet by mouth daily.     omeprazole (PRILOSEC) 40 MG capsule TAKE 1 CAPSULE(40 MG) BY MOUTH DAILY 90 capsule 3   ONETOUCH ULTRA TEST  test strip USE TO CHECK GLUCOSE TWICE DAILY AND AS NEEDED 100 strip 1   potassium chloride SA (KLOR-CON M) 20 MEQ tablet Take 2 tablets (40 mEq total) by mouth daily.     pravastatin (PRAVACHOL) 10 MG tablet Take 1 tablet (10 mg total) by mouth every Monday, Wednesday, and Friday.     sacubitril-valsartan (ENTRESTO) 97-103 MG TAKE 1 TABLET BY MOUTH TWICE DAILY 180 tablet 3   spironolactone (ALDACTONE) 25 MG tablet TAKE 1 TABLET(25 MG) BY MOUTH DAILY 90 tablet 3   traMADol (ULTRAM) 50 MG tablet TAKE 1 TABLET(50 MG) BY MOUTH EVERY 12 HOURS AS NEEDED FOR PAIN 60 tablet 2   XARELTO 20 MG TABS tablet TAKE 1 TABLET(20 MG) BY MOUTH DAILY WITH SUPPER 30 tablet 5   No current facility-administered medications on file prior to visit.    Allergies  Allergen Reactions   Atorvastatin     Muscle pain     Past Medical History:  Diagnosis Date   AICD (automatic cardioverter/defibrillator) present    Allergy 1968   Anxiety state, unspecified    Calculus of kidney 1981   "passed it on my own"   Cataract 2020   Chronic systolic CHF (congestive heart failure) (HCC)  Dermatophytosis of the body    DJD (degenerative joint disease)    Esophageal reflux    Hx of colonic polyps    Hypertension    LBBB (left bundle branch block)    intermittent   Non-ischemic cardiomyopathy (HCC)    a. cath 2013: minor nonobstructive CAD.   OSA (obstructive sleep apnea)    he can't tolerate CPAP.    Osteoarthrosis, unspecified whether generalized or localized, hand    Other chronic nonalcoholic liver disease    fatty liver   PAF (paroxysmal atrial fibrillation) (HCC)    a. s/p DCCV 6/11; previously on Pradaxa;  b. Event Monitor 2012->No PAF;  c. 09/2011 s/p DCCV ->Xarelto started. d. 03/2014: inappropriate ICD shocks for AF-RVR, started on Tikosyn, Xarelto restarted.   Type II diabetes mellitus (HCC)    Unspecified hearing loss    no hearing aid    Past Surgical History:  Procedure Laterality Date   Abdominal  US  01/2007   Fatty liver, no gallstones   APPENDECTOMY     ATRIAL TACH ABLATION  09/2015   BI-VENTRICULAR IMPLANTABLE CARDIOVERTER DEFIBRILLATOR [name] 02/14/2013   STJ CRTD implanted by Dr Graciela Husbands   BILATERAL KNEE ARTHROSCOPY     BIV ICD GENERATOR CHANGEOUT [name] 06/06/2019   Procedure: BIV ICD GENERATOR CHANGEOUT;  Surgeon: Duke Salvia, MD;  [facility] LAB;  Service: Cardiovascular;  Laterality: [name];   CARDIOVERSION  09/29/2011   Procedure: CARDIOVERSION;  Surgeon: Vesta Mixer, MD;  Location: [facility] OR;  Service: Cardiovascular;  Laterality: [name];   COLONOSCOPY WITH PROPOFOL [name] 10/26/2016   Procedure: COLONOSCOPY WITH PROPOFOL;  Surgeon: Hilarie Fredrickson, MD;  Location: [facility] ENDOSCOPY;  Service: Endoscopy;  Laterality: [name];   DOPPLER ECHOCARDIOGRAPHY  11/2009   Decreased EF of 35-40%   ELECTROPHYSIOLOGIC STUDY [name] 10/14/2015   Procedure: Atrial Fibrillation Ablation;  Surgeon: Hillis Range, MD;  Location: [facility] INVASIVE CV LAB;  Service: Cardiovascular;  Laterality: [name];   EYE SURGERY  December 2020   LEFT HEART CATHETERIZATION WITH CORONARY ANGIOGRAM [name] 08/18/2011   Procedure: LEFT HEART CATHETERIZATION WITH CORONARY ANGIOGRAM;  Surgeon: Peter M Swaziland, MD;  Location: [facility] CATH LAB;  Service: Cardiovascular;  Laterality: [name];   NECK SURGERY     plate/fusion   Stress Cardiolite  08/2005   Normal, EF 42%   UPPER GASTROINTESTINAL ENDOSCOPY  08/2006   GERD    Family History  Problem Relation Age of Onset   Hypertension Mother    Diabetes Mother    Heart failure Mother        Died @ 30   Arthritis Mother    Heart disease Mother    Hypertension Father    Heart failure Father        Died @ 25   Prostate cancer Father    Arthritis Father    Hearing loss Father    Heart disease Father    Other Sister    Arthritis Sister    Multiple myeloma Sister    Leukemia Sister    Arthritis Sister    Cancer Sister    Arthritis Sister    Colon polyps Sister    Arthritis Sister     Diabetes Brother    Kidney disease Brother    Arthritis Brother    Diabetes Brother    Prostate cancer Brother    Arthritis Brother    Diabetes Brother    Arthritis Brother    Cirrhosis Maternal Uncle  alcohol related   Stroke Paternal Grandmother    Colon cancer Paternal Grandmother    Heart attack Paternal Grandfather    Esophageal cancer Neg Hx    Stomach cancer Neg Hx    Pancreatic cancer Neg Hx     Social History   Socioeconomic History   Marital status: Married    Spouse name: Not on file   Number of children: 2   Years of education: Not on file   Highest education level: Not on file  Occupational History   Occupation: PAINTER    Employer: UNEMPLOYED  Tobacco Use   Smoking status: Never    Passive exposure: Past   Smokeless tobacco: Former    Types: Snuff    Quit date: 10/12/2004   Tobacco comments:    02/14/2013 "quit snuff in 2006"  Vaping Use   Vaping status: Never Used  Substance and Sexual Activity   Alcohol use: No   Drug use: No   Sexual activity: Yes    Birth control/protection: None  Other Topics Concern   Not on file  Social History Narrative   Lives in [place] with wife.  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   Married 1990   Social Determinants of Health   Financial Resource Strain: Low Risk  (07/08/2022)   Overall Financial Resource Strain (CARDIA)    Difficulty of Paying Living Expenses: Not hard at all  Food Insecurity: No Food Insecurity (07/08/2022)   Hunger Vital Sign    Worried About Running Out of Food in the Last Year: Never true    Ran Out of Food in the Last Year: Never true  Transportation Needs: No Transportation Needs (07/08/2022)   PRAPARE - Administrator, Civil Service (Medical): No    Lack of Transportation (Non-Medical): No  Physical Activity: Sufficiently Active (07/08/2022)   Exercise Vital Sign    Days of Exercise per Week: 3 days    Minutes of Exercise per Session: 60 min  Stress: No  Stress Concern Present (07/08/2022)   [employer] of Occupational Health - Occupational Stress Questionnaire    Feeling of Stress : Not at all  Social Connections: Socially Integrated (07/08/2022)   Social Connection and Isolation Panel [NHANES]    Frequency of Communication with Friends and Family: More than three times a week    Frequency of Social Gatherings with Friends and Family: More than three times a week    Attends Religious Services: More than 4 times per year    Active Member of [employer] or Organizations: Yes    Attends Engineer, structural: More than 4 times per year    Marital Status: Married  Catering manager Violence: Not At Risk (07/08/2022)   Humiliation, Afraid, Rape, and Kick questionnaire    Fear of Current or Ex-Partner: No    Emotionally Abused: No    Physically Abused: No    Sexually Abused: No   Review of Systems Occasional cough---no illness No sOB    Objective:   Physical Exam Constitutional:      Appearance: Normal appearance.  Cardiovascular:     Rate and Rhythm: Normal rate and regular rhythm.     Heart sounds: No murmur heard.    No gallop.  Pulmonary:     Effort: Pulmonary effort is normal.     Breath sounds: Normal breath sounds. No wheezing or rales.  Abdominal:     Palpations: Abdomen is soft.     Tenderness: There is no abdominal  tenderness.     Comments: Has prominent skin fold just at lower left rib border--no tenderness Several vertical striae in that area  Musculoskeletal:     Cervical back: Neck supple.  Lymphadenopathy:     Cervical: No cervical adenopathy.  Neurological:     Mental Status: He is alert.            Assessment & Plan:

## 2022-11-19 ENCOUNTER — Ambulatory Visit (INDEPENDENT_AMBULATORY_CARE_PROVIDER_SITE_OTHER): Payer: PPO

## 2022-11-19 DIAGNOSIS — Z23 Encounter for immunization: Secondary | ICD-10-CM | POA: Diagnosis not present

## 2022-12-01 ENCOUNTER — Ambulatory Visit (INDEPENDENT_AMBULATORY_CARE_PROVIDER_SITE_OTHER): Payer: PPO

## 2022-12-01 DIAGNOSIS — I428 Other cardiomyopathies: Secondary | ICD-10-CM | POA: Diagnosis not present

## 2022-12-01 LAB — CUP PACEART REMOTE DEVICE CHECK
Battery Remaining Longevity: 41 mo
Battery Remaining Percentage: 48 %
Battery Voltage: 2.95 V
Brady Statistic AP VP Percent: 1.9 %
Brady Statistic AP VS Percent: 1 %
Brady Statistic AS VP Percent: 93 %
Brady Statistic AS VS Percent: 2.7 %
Brady Statistic RA Percent Paced: 1 %
Date Time Interrogation Session: 20241106020023
HighPow Impedance: 83 Ohm
HighPow Impedance: 83 Ohm
Implantable Lead Connection Status: 753985
Implantable Lead Connection Status: 753985
Implantable Lead Connection Status: 753985
Implantable Lead Implant Date: 20150121
Implantable Lead Implant Date: 20150121
Implantable Lead Implant Date: 20150121
Implantable Lead Location: 753858
Implantable Lead Location: 753859
Implantable Lead Location: 753860
Implantable Lead Model: 7122
Implantable Pulse Generator Implant Date: 20210512
Lead Channel Impedance Value: 490 Ohm
Lead Channel Impedance Value: 490 Ohm
Lead Channel Impedance Value: 540 Ohm
Lead Channel Pacing Threshold Amplitude: 0.875 V
Lead Channel Pacing Threshold Amplitude: 1.125 V
Lead Channel Pacing Threshold Amplitude: 1.375 V
Lead Channel Pacing Threshold Pulse Width: 0.4 ms
Lead Channel Pacing Threshold Pulse Width: 0.4 ms
Lead Channel Pacing Threshold Pulse Width: 0.5 ms
Lead Channel Sensing Intrinsic Amplitude: 10.4 mV
Lead Channel Sensing Intrinsic Amplitude: 4.5 mV
Lead Channel Setting Pacing Amplitude: 1.875
Lead Channel Setting Pacing Amplitude: 2.125
Lead Channel Setting Pacing Amplitude: 2.375
Lead Channel Setting Pacing Pulse Width: 0.4 ms
Lead Channel Setting Pacing Pulse Width: 0.5 ms
Lead Channel Setting Sensing Sensitivity: 0.5 mV
Pulse Gen Serial Number: 9890691

## 2022-12-02 ENCOUNTER — Telehealth: Payer: Self-pay | Admitting: Family Medicine

## 2022-12-02 NOTE — Telephone Encounter (Signed)
Prescription Request  12/02/2022  LOV: 07/09/2022  What is the name of the medication or equipment? Triamcinolone acetonide USP 0.1%  Have you contacted your pharmacy to request a refill? No   Which pharmacy would you like this sent to?  [id number] - [place], [place] - [address] AT [facility] [address] [place] [address] Phone: [phone] Fax: [phone]    Patient notified that their request is being sent to the clinical staff for review and that they should receive a response within 2 business days.   Please advise at Mobile [phone] (mobile)  Pt's wife, Silvio Pate, called stating the pt's dermatologist usually refills cream for pt but the provider retired & closed his practice down. Silvio Pate stated the spoke to the dermatologist & was recommended to request refill from Dr. Para March.

## 2022-12-03 MED ORDER — TRIAMCINOLONE ACETONIDE 0.1 % EX CREA: 1.0000 | TOPICAL_CREAM | Freq: Two times a day (BID) | CUTANEOUS | 2 refills | Status: AC | PRN

## 2022-12-03 NOTE — Telephone Encounter (Signed)
Sent. Thanks.   

## 2022-12-08 ENCOUNTER — Other Ambulatory Visit: Payer: Self-pay | Admitting: Family Medicine

## 2022-12-08 NOTE — Telephone Encounter (Signed)
last filled: 09/12/22 TRAMADOL 50MG  TABLETS qty: 60 tablets 2 refills last office visit:07/09/22 next office visit: nothing scheduled

## 2022-12-10 ENCOUNTER — Encounter: Payer: Self-pay | Admitting: Internal Medicine

## 2022-12-21 NOTE — Progress Notes (Signed)
Remote ICD transmission.   

## 2022-12-22 ENCOUNTER — Other Ambulatory Visit: Payer: Self-pay | Admitting: Family Medicine

## 2022-12-29 ENCOUNTER — Other Ambulatory Visit: Payer: Self-pay | Admitting: Family Medicine

## 2022-12-29 NOTE — Telephone Encounter (Signed)
Sent. Thanks.   

## 2022-12-29 NOTE — Telephone Encounter (Signed)
ALPRAZOLAM 0.25MG  TABLETS Name of Pharmacy:   Last Fill 09/29/22 #30 2 rf  Last Office Visit and Type: 06/29/22 DM f/u  Next visit: should have had f/u in 3 mo but didn't make

## 2023-01-03 ENCOUNTER — Other Ambulatory Visit: Payer: Self-pay

## 2023-01-03 ENCOUNTER — Other Ambulatory Visit: Payer: Self-pay | Admitting: Family Medicine

## 2023-01-03 ENCOUNTER — Encounter: Payer: Self-pay | Admitting: Internal Medicine

## 2023-01-03 DIAGNOSIS — I4891 Unspecified atrial fibrillation: Secondary | ICD-10-CM

## 2023-01-03 MED ORDER — RIVAROXABAN 20 MG PO TABS
20.0000 mg | ORAL_TABLET | Freq: Every day | ORAL | 5 refills | Status: DC
Start: 2023-01-03 — End: 2023-06-29

## 2023-01-03 NOTE — Telephone Encounter (Signed)
Prescription refill request for Xarelto received.  Indication:afib Last office visit:8/24 Weight:126.6  kg Age:62 Scr:0.79  6/24 CrCl:175.83  ml/min  Prescription refilled

## 2023-01-21 ENCOUNTER — Other Ambulatory Visit: Payer: Self-pay | Admitting: Family Medicine

## 2023-01-27 ENCOUNTER — Other Ambulatory Visit: Payer: Self-pay | Admitting: Family Medicine

## 2023-02-04 ENCOUNTER — Ambulatory Visit (INDEPENDENT_AMBULATORY_CARE_PROVIDER_SITE_OTHER): Payer: PPO | Admitting: Family Medicine

## 2023-02-04 ENCOUNTER — Encounter: Payer: Self-pay | Admitting: Family Medicine

## 2023-02-04 VITALS — BP 116/78 | HR 87 | Temp 98.1°F | Ht 76.0 in | Wt 289.2 lb

## 2023-02-04 DIAGNOSIS — Z7984 Long term (current) use of oral hypoglycemic drugs: Secondary | ICD-10-CM

## 2023-02-04 DIAGNOSIS — E119 Type 2 diabetes mellitus without complications: Secondary | ICD-10-CM

## 2023-02-04 DIAGNOSIS — F411 Generalized anxiety disorder: Secondary | ICD-10-CM | POA: Diagnosis not present

## 2023-02-04 LAB — COMPREHENSIVE METABOLIC PANEL
ALT: 20 U/L (ref 0–53)
AST: 17 U/L (ref 0–37)
Albumin: 5 g/dL (ref 3.5–5.2)
Alkaline Phosphatase: 51 U/L (ref 39–117)
BUN: 21 mg/dL (ref 6–23)
CO2: 32 meq/L (ref 19–32)
Calcium: 9.6 mg/dL (ref 8.4–10.5)
Chloride: 100 meq/L (ref 96–112)
Creatinine, Ser: 0.81 mg/dL (ref 0.40–1.50)
GFR: 95.3 mL/min (ref >60.00)
Glucose, Bld: 232 mg/dL — ABNORMAL HIGH (ref 70–99)
Potassium: 4.9 meq/L (ref 3.5–5.1)
Sodium: 140 meq/L (ref 135–145)
Total Bilirubin: 0.6 mg/dL (ref 0.2–1.2)
Total Protein: 7.1 g/dL (ref 6.0–8.3)

## 2023-02-04 LAB — CBC WITH DIFFERENTIAL/PLATELET
Basophils Absolute: 0 10*3/uL (ref 0.0–0.1)
Basophils Relative: 0.6 % (ref 0.0–3.0)
Eosinophils Absolute: 0.2 10*3/uL (ref 0.0–0.7)
Eosinophils Relative: 2.3 % (ref 0.0–5.0)
HCT: 51.3 % (ref 39.0–52.0)
Hemoglobin: 16.8 g/dL (ref 13.0–17.0)
Lymphocytes Relative: 20.8 % (ref 12.0–46.0)
Lymphs Abs: 1.4 10*3/uL (ref 0.7–4.0)
MCHC: 32.6 g/dL (ref 30.0–36.0)
MCV: 90.7 fL (ref 78.0–100.0)
Monocytes Absolute: 0.6 10*3/uL (ref 0.1–1.0)
Monocytes Relative: 8.8 % (ref 3.0–12.0)
Neutro Abs: 4.6 10*3/uL (ref 1.4–7.7)
Neutrophils Relative %: 67.5 % (ref 43.0–77.0)
Platelets: 170 10*3/uL (ref 150.0–400.0)
RBC: 5.66 Mil/uL (ref 4.22–5.81)
RDW: 14 % (ref 11.5–15.5)
WBC: 6.8 10*3/uL (ref 4.0–10.5)

## 2023-02-04 LAB — HEMOGLOBIN A1C: Hgb A1c MFr Bld: 8.3 % — ABNORMAL HIGH (ref 4.6–6.5)

## 2023-02-04 NOTE — Progress Notes (Signed)
 Diabetes:  Using medications without difficulties: yes Hypoglycemic episodes:no sx Hyperglycemic episodes:no sx Feet problems:no Blood Sugars averaging: not checked often.   eye exam within last year: yes Labs pending.  He has been off exercise routine.   See notes on labs.   Taking xanax  daily.  Anxiety d/w pt.  Manageable with med.  No SI/HI.  No ADE on med.  D/w pt about diet and exercise.  D/w pt about other non BZD options but I want to check med interactions.  See following phone note.   Meds, vitals, and allergies reviewed.   ROS: Per HPI unless specifically indicated in ROS section   GEN: nad, alert and oriented HEENT: ncat NECK: supple w/o LA CV: rrr. PULM: ctab, no inc wob ABD: soft, +bs EXT: no edema SKIN: well perfused.   Diabetic foot exam: Normal inspection No skin breakdown No calluses  Normal DP pulses Normal sensation to light touch and monofilament Nails normal

## 2023-02-04 NOTE — Patient Instructions (Addendum)
 Go to the lab on the way out.   If you have mychart we'll likely use that to update you.    Take care.  Glad to see you. Recheck at a yearly visit in about 6 months.

## 2023-02-06 ENCOUNTER — Telehealth: Payer: Self-pay | Admitting: Family Medicine

## 2023-02-06 MED ORDER — BUSPIRONE HCL 5 MG PO TABS
5.0000 mg | ORAL_TABLET | Freq: Two times a day (BID) | ORAL | Status: DC
Start: 2023-02-06 — End: 2023-02-06

## 2023-02-06 MED ORDER — BUSPIRONE HCL 5 MG PO TABS: 5.0000 mg | ORAL_TABLET | Freq: Two times a day (BID) | ORAL | 1 refills | Status: DC

## 2023-02-06 NOTE — Assessment & Plan Note (Signed)
 Taking xanax daily.  Anxiety d/w pt.  Manageable with med.  No SI/HI.  No ADE on med.  D/w pt about diet and exercise.  D/w pt about other non BZD options but I want to check med interactions.  See following phone note.

## 2023-02-06 NOTE — Telephone Encounter (Signed)
 Please call pt.    Would try taking buspar  5mg  BID for anxiety.   Could increase to 10mg  BID after 1 week if needed.  I sent the rx in the meantime.   Could still use xanax  if needed but would hopefully need/use it less.    A1c is elevated.  Other labs are stable.  Offer insulin  start with follow up in 3 months.  Let me know if he consents for insulin  start.  If he needs insulin  teaching then I can send rx and set him up for teaching here at clinic.    Thanks.

## 2023-02-06 NOTE — Assessment & Plan Note (Signed)
 Continue jardiance, glipizide, metformin.  Recheck at a yearly visit in about 6 months.

## 2023-02-07 NOTE — Telephone Encounter (Signed)
 Noted. Thanks.

## 2023-02-07 NOTE — Telephone Encounter (Signed)
 Copied from CRM [id number]. Topic: Clinical - Medication Question >> Feb 07, 2023 12:53 PM Robinson H wrote: Reason for CRM: Patient is calling to let provider know that he does not want to start insulin  too expensive, will workout and try to get it down on his own A1C down and come back in 3 months to have it checked.

## 2023-02-07 NOTE — Telephone Encounter (Signed)
 Spoke with patient and advised of Dr. Lianne Bushy notes. Patient does not want to do insulin he would like to start with ozempic. Please advise

## 2023-03-02 ENCOUNTER — Ambulatory Visit (INDEPENDENT_AMBULATORY_CARE_PROVIDER_SITE_OTHER): Payer: HMO

## 2023-03-02 DIAGNOSIS — I428 Other cardiomyopathies: Secondary | ICD-10-CM | POA: Diagnosis not present

## 2023-03-02 LAB — CUP PACEART REMOTE DEVICE CHECK
Battery Remaining Longevity: 37 mo
Battery Remaining Percentage: 45 %
Battery Voltage: 2.95 V
Brady Statistic AP VP Percent: 1.6 %
Brady Statistic AP VS Percent: 1 %
Brady Statistic AS VP Percent: 93 %
Brady Statistic AS VS Percent: 2.5 %
Brady Statistic RA Percent Paced: 1 %
Date Time Interrogation Session: 20250205020018
HighPow Impedance: 87 Ohm
HighPow Impedance: 87 Ohm
Implantable Lead Connection Status: 753985
Implantable Lead Connection Status: 753985
Implantable Lead Connection Status: 753985
Implantable Lead Implant Date: 20150121
Implantable Lead Implant Date: 20150121
Implantable Lead Implant Date: 20150121
Implantable Lead Location: 753858
Implantable Lead Location: 753859
Implantable Lead Location: 753860
Implantable Lead Model: 7122
Implantable Pulse Generator Implant Date: 20210512
Lead Channel Impedance Value: 450 Ohm
Lead Channel Impedance Value: 480 Ohm
Lead Channel Impedance Value: 540 Ohm
Lead Channel Pacing Threshold Amplitude: 0.875 V
Lead Channel Pacing Threshold Amplitude: 1.25 V
Lead Channel Pacing Threshold Amplitude: 1.375 V
Lead Channel Pacing Threshold Pulse Width: 0.4 ms
Lead Channel Pacing Threshold Pulse Width: 0.4 ms
Lead Channel Pacing Threshold Pulse Width: 0.5 ms
Lead Channel Sensing Intrinsic Amplitude: 12 mV
Lead Channel Sensing Intrinsic Amplitude: 5 mV
Lead Channel Setting Pacing Amplitude: 1.875
Lead Channel Setting Pacing Amplitude: 2.25 V
Lead Channel Setting Pacing Amplitude: 2.375
Lead Channel Setting Pacing Pulse Width: 0.4 ms
Lead Channel Setting Pacing Pulse Width: 0.5 ms
Lead Channel Setting Sensing Sensitivity: 0.5 mV
Pulse Gen Serial Number: 9890691

## 2023-03-04 ENCOUNTER — Telehealth (HOSPITAL_COMMUNITY): Payer: Self-pay

## 2023-03-04 NOTE — Telephone Encounter (Signed)
 Advanced Heart Failure Patient Advocate Encounter  Returned patient call regarding grant renewal. Review of patient chart shows that the current approval will end on 02/11. Spoke with patient by phone, informed him that I will renew the grant and call [facility] with the new ID information on 02/11.  Rachel DEL, CPhT Rx Patient Advocate Phone: [phone]

## 2023-03-08 ENCOUNTER — Other Ambulatory Visit: Payer: Self-pay | Admitting: Family Medicine

## 2023-03-08 NOTE — Telephone Encounter (Signed)
Advanced Heart Failure Patient Advocate Encounter  The patient was renewed for a [employer] grant that will help cover the cost of Carvedilol, Diltiazem, Entresto, Jardiance, Spironolactone.  Total amount awarded, $10,000.  Effective: 03/09/2023 - 03/07/2024.  BIN [id number] PCN [id number] Group [id number] ID [id number]  Pharmacy provided with approval and processing information. Patient informed via phone.  Burnell Blanks, CPhT Rx Patient Advocate Phone: [phone]

## 2023-03-08 NOTE — Telephone Encounter (Signed)
Last office visit: 02/04/23 Next office visit: nothing scheduled Last refill:  TRAMADOL 50MG  TABLETS  12/25/22 60 tablets 2 refills

## 2023-03-09 NOTE — Telephone Encounter (Signed)
Sent. Thanks.

## 2023-03-23 ENCOUNTER — Other Ambulatory Visit: Payer: Self-pay | Admitting: Cardiovascular Disease

## 2023-03-28 ENCOUNTER — Encounter: Payer: Self-pay | Admitting: Internal Medicine

## 2023-03-29 ENCOUNTER — Other Ambulatory Visit: Payer: Self-pay | Admitting: Family Medicine

## 2023-04-01 ENCOUNTER — Encounter: Payer: Self-pay | Admitting: Family Medicine

## 2023-04-03 ENCOUNTER — Other Ambulatory Visit: Payer: Self-pay | Admitting: Family Medicine

## 2023-04-03 ENCOUNTER — Telehealth: Payer: Self-pay | Admitting: Family Medicine

## 2023-04-03 MED ORDER — ONDANSETRON HCL 4 MG PO TABS: 4.0000 mg | ORAL_TABLET | Freq: Three times a day (TID) | ORAL | 0 refills | Status: DC | PRN

## 2023-04-03 NOTE — Telephone Encounter (Signed)
 Please triage patient about fever and GI sx.  Thanks.

## 2023-04-04 NOTE — Telephone Encounter (Signed)
 I spoke with pt; pt said he appreciated med sent to pharmacy and today pt said "he feels good today". Pt said thinks was a virus that started on 04/01/23 because now his wife has same symptoms. Pt said he does not need to be seen since symptoms are gone and pt will cb if needed. Sending note to Dr Para March as Lorain Childes.

## 2023-04-04 NOTE — Telephone Encounter (Signed)
 Noted. Thanks.

## 2023-04-08 NOTE — Progress Notes (Signed)
 Remote ICD transmission.

## 2023-04-11 ENCOUNTER — Other Ambulatory Visit: Payer: Self-pay | Admitting: Cardiovascular Disease

## 2023-05-09 ENCOUNTER — Ambulatory Visit: Payer: Self-pay

## 2023-05-09 ENCOUNTER — Telehealth: Payer: Self-pay | Admitting: Family Medicine

## 2023-05-09 NOTE — Telephone Encounter (Signed)
 Noted-I will see him tomorrow at the appt

## 2023-05-09 NOTE — Telephone Encounter (Signed)
 FYI: This call has been transferred to Access Nurse. Once the result note has been entered staff can address the message at that time.  Patient called in with the following symptoms:  Red Word:dizziness    Please advise at Mobile [phone] (mobile)  Message is routed to Provider Pool and [facility] Triage    Aaron Thompson scheduled appt with Dr. Vallarie Gauze for 4/21 for inner ear issues & dizziness when he bend/lay down. Spoke to Aaron Thompson, Aaron Thompson stated the dizziness started about a week ago. Aaron Thompson states his left ear has some discomfort & he feels as if his ear/head tends to have a popping feeling. R/s Aaron Thompson to sooner appt with Dr. Letvak for tomorrow, 4/15 @ 9:45am. Transferred Aaron Thompson to a triage nurse.

## 2023-05-09 NOTE — Telephone Encounter (Signed)
 Noted. Thanks.

## 2023-05-09 NOTE — Telephone Encounter (Signed)
  Chief Complaint: Dizziness  Symptoms:   Frequency: One Week or more  Pertinent Negatives: Patient denies headaches, weakness, chest pain, dyspnea, or any other symptoms.   Disposition: [] ED /[] Urgent Care (no appt availability in office) / [x] Appointment(In office/virtual)/ []  [facility]/ [] Home Care/ [] Refused Recommended Disposition /[] [facility]/ []  Follow-up with PCP  Additional Notes: MO is being triaged for dizziness that has been going on for a little over a week. The patient states he has really bad allergies and he takes Claritin everyday. The patient is experiencing room spinning sensations, and feels as if he needs to catch himself to regain balance. In office appointment for tomorrow for evaluation.   Reason for Disposition  [1] MODERATE dizziness (e.g., vertigo; feels very unsteady, interferes with normal activities) AND [2] has NOT been evaluated by doctor (or NP/PA) for this  Answer Assessment - Initial Assessment Questions 1. DESCRIPTION: "Describe your dizziness."     Room spinning   2. VERTIGO: "Do you feel like either you or the room is spinning or tilting?"      Spinning  3. LIGHTHEADED: "Do you feel lightheaded?" (e.g., somewhat faint, woozy, weak upon standing)     No  4. SEVERITY: "How bad is it?"  "Can you walk?"   - MILD: Feels slightly dizzy and unsteady, but is walking normally.   - MODERATE: Feels unsteady when walking, but not falling; interferes with normal activities (e.g., school, work).   - SEVERE: Unable to walk without falling, or requires assistance to walk without falling.     Mild to Moderate  5. ONSET:  "When did the dizziness begin?"     One Week more  6. AGGRAVATING FACTORS: "Does anything make it worse?" (e.g., standing, change in head position)     Right sided position when laying down  7. CAUSE: "What do you think is causing the dizziness?"     Unsure  8. RECURRENT SYMPTOM: "Have you had dizziness before?"  If Yes, ask: "When was the last time?" "What happened that time?"     No  9. OTHER SYMPTOMS: "Do you have any other symptoms?" (e.g., headache, weakness, numbness, vomiting, earache)     Popping Sensation, Ear Drainage  Protocols used: Dizziness - Vertigo-A-AH

## 2023-05-10 ENCOUNTER — Ambulatory Visit (INDEPENDENT_AMBULATORY_CARE_PROVIDER_SITE_OTHER): Admitting: Internal Medicine

## 2023-05-10 ENCOUNTER — Encounter: Payer: Self-pay | Admitting: Internal Medicine

## 2023-05-10 VITALS — BP 100/70 | HR 81 | Temp 97.8°F | Ht 76.0 in | Wt 287.0 lb

## 2023-05-10 DIAGNOSIS — R42 Dizziness and giddiness: Secondary | ICD-10-CM | POA: Diagnosis not present

## 2023-05-10 NOTE — Assessment & Plan Note (Signed)
 Classic labyrinthine symptoms--mild Not enough for meclizine Discussed no middle ear findings Recommended extra furosemide dose today and perhaps tomorrow Continue to avoid salt

## 2023-05-10 NOTE — Progress Notes (Signed)
 Subjective:    Patient ID: Aaron Thompson, male    DOB: Oct 09, 1960, 63 y.o.   MRN: [id number]  HPI Here due to ear fullness  Thought he might have some fluid in them Some trouble with balance yesterday---would feel like he was moving backwards at times No vertigo though Pressure behind ears/occiput--but no set headache  Has had symptoms in bed for a few weeks---if he turns over (getting vertigo) It will pass fairly quickly  Has tried claritin and coricidin Doesn't feel sick  Current Outpatient Medications on File Prior to Visit  Medication Sig Dispense Refill   acetaminophen (TYLENOL) 325 MG tablet Take 650 mg by mouth every 6 (six) hours as needed for mild pain or moderate pain.     ALPRAZolam (XANAX) 0.25 MG tablet TAKE 1 TABLET BY MOUTH DAILY AS NEEDED FOR ANXIETY 30 tablet 2   Blood Glucose Monitoring Suppl (ONE TOUCH ULTRA 2) w/Device KIT Use daily to check sugars as needed. Dx E11.9 1 kit 0   busPIRone (BUSPAR) 5 MG tablet Take 1-2 tablets (5-10 mg total) by mouth 2 (two) times daily. 120 tablet 1   carvedilol (COREG) 25 MG tablet TAKE 2 TABLETS(50 MG) BY MOUTH TWICE DAILY WITH A MEAL 360 tablet 3   diltiazem (CARDIZEM) 30 MG tablet TAKE 1 TABLET BY MOUTH AS NEEDED, MAY REPEAT DOSE IN 4 HOURS ONCE IF NEEDED 90 tablet 0   dofetilide (TIKOSYN) 500 MCG capsule TAKE 1 CAPSULE(500 MCG) BY MOUTH TWICE DAILY 180 capsule 1   furosemide (LASIX) 40 MG tablet TAKE 1 TABLET BY MOUTH EVERY FRIDAY 12 tablet 3   glipiZIDE (GLUCOTROL XL) 10 MG 24 hr tablet TAKE 1 TABLET(10 MG) BY MOUTH DAILY WITH BREAKFAST 30 tablet 4   JARDIANCE 25 MG TABS tablet TAKE 1 TABLET BY MOUTH EVERY DAY 90 tablet 1   loratadine (CLARITIN) 10 MG tablet Take 10 mg by mouth daily as needed for allergies.     Magnesium Oxide 400 MG CAPS Take 1 capsule (400 mg total) by mouth daily. 90 capsule 3   metFORMIN (GLUCOPHAGE-XR) 500 MG 24 hr tablet TAKE 3 TABLETS(1500 MG) BY MOUTH DAILY WITH BREAKFAST 270 tablet 1   Multiple  Vitamin (MULTIVITAMIN) tablet Take 1 tablet by mouth daily.     omeprazole (PRILOSEC) 40 MG capsule TAKE 1 CAPSULE(40 MG) BY MOUTH DAILY 90 capsule 0   ondansetron (ZOFRAN) 4 MG tablet Take 1 tablet (4 mg total) by mouth every 8 (eight) hours as needed for nausea or vomiting. 20 tablet 0   ONETOUCH ULTRA TEST test strip USE TO CHECK GLUCOSE TWICE DAILY AND AS NEEDED 100 strip 1   potassium chloride SA (KLOR-CON M) 20 MEQ tablet Take 2 tablets (40 mEq total) by mouth daily.     pravastatin (PRAVACHOL) 10 MG tablet Take 1 tablet (10 mg total) by mouth every Monday, Wednesday, and Friday.     rivaroxaban (XARELTO) 20 MG TABS tablet Take 1 tablet (20 mg total) by mouth daily with supper. 30 tablet 5   sacubitril-valsartan (ENTRESTO) 97-103 MG TAKE 1 TABLET BY MOUTH TWICE DAILY 180 tablet 1   spironolactone (ALDACTONE) 25 MG tablet TAKE 1 TABLET(25 MG) BY MOUTH DAILY 90 tablet 3   traMADol (ULTRAM) 50 MG tablet TAKE 1 TABLET(50 MG) BY MOUTH EVERY 12 HOURS AS NEEDED FOR PAIN 60 tablet 2   triamcinolone cream (KENALOG) 0.1 % Apply 1 Application topically 2 (two) times daily as needed. 45 g 2   No current facility-administered  medications on file prior to visit.    Allergies  Allergen Reactions   Atorvastatin     Muscle pain     Past Medical History:  Diagnosis Date   AICD (automatic cardioverter/defibrillator) present    Allergy 1968   Anxiety state, unspecified    Calculus of kidney 1981   "passed it on my own"   Cataract 2020   Chronic systolic CHF (congestive heart failure) (HCC)    Dermatophytosis of the body    DJD (degenerative joint disease)    Esophageal reflux    Hx of colonic polyps    Hypertension    LBBB (left bundle branch block)    intermittent   Non-ischemic cardiomyopathy (HCC)    a. cath 2013: minor nonobstructive CAD.   OSA (obstructive sleep apnea)    he can't tolerate CPAP.    Osteoarthrosis, unspecified whether generalized or localized, hand    Other chronic  nonalcoholic liver disease    fatty liver   PAF (paroxysmal atrial fibrillation) (HCC)    a. s/p DCCV 6/11; previously on Pradaxa;  b. Event Monitor 2012->No PAF;  c. 09/2011 s/p DCCV ->Xarelto started. d. 03/2014: inappropriate ICD shocks for AF-RVR, started on Tikosyn, Xarelto restarted.   Type II diabetes mellitus (HCC)    Unspecified hearing loss    no hearing aid    Past Surgical History:  Procedure Laterality Date   Abdominal US  01/2007   Fatty liver, no gallstones   APPENDECTOMY     ATRIAL TACH ABLATION  09/2015   BI-VENTRICULAR IMPLANTABLE CARDIOVERTER DEFIBRILLATOR [name] 02/14/2013   STJ CRTD implanted by Dr Graciela Husbands   BILATERAL KNEE ARTHROSCOPY     BIV ICD GENERATOR CHANGEOUT [name] 06/06/2019   Procedure: BIV ICD GENERATOR CHANGEOUT;  Surgeon: Duke Salvia, MD;  Location: [facility] INVASIVE CV LAB;  Service: Cardiovascular;  Laterality: [name];   CARDIOVERSION  09/29/2011   Procedure: CARDIOVERSION;  Surgeon: Vesta Mixer, MD;  Location: [facility] OR;  Service: Cardiovascular;  Laterality: [name];   COLONOSCOPY WITH PROPOFOL [name] 10/26/2016   Procedure: COLONOSCOPY WITH PROPOFOL;  Surgeon: Hilarie Fredrickson, MD;  Location: [facility] ENDOSCOPY;  Service: Endoscopy;  Laterality: [name];   DOPPLER ECHOCARDIOGRAPHY  11/2009   Decreased EF of 35-40%   ELECTROPHYSIOLOGIC STUDY [name] 10/14/2015   Procedure: Atrial Fibrillation Ablation;  Surgeon: Hillis Range, MD;  Location: [facility] INVASIVE CV LAB;  Service: Cardiovascular;  Laterality: [name];   EYE SURGERY  December 2020   LEFT HEART CATHETERIZATION WITH CORONARY ANGIOGRAM [name] 08/18/2011   Procedure: LEFT HEART CATHETERIZATION WITH CORONARY ANGIOGRAM;  Surgeon: Peter M Swaziland, MD;  Location: [facility] CATH LAB;  Service: Cardiovascular;  Laterality: [name];   NECK SURGERY     plate/fusion   Stress Cardiolite  08/2005   Normal, EF 42%   UPPER GASTROINTESTINAL ENDOSCOPY  08/2006   GERD    Family History  Problem Relation Age of Onset   Hypertension Mother    Diabetes Mother     Heart failure Mother        Died @ 9   Arthritis Mother    Heart disease Mother    Hypertension Father    Heart failure Father        Died @ 39   Prostate cancer Father    Arthritis Father    Hearing loss Father    Heart disease Father    Other Sister    Arthritis Sister    Multiple myeloma Sister    Leukemia Sister  Arthritis Sister    Cancer Sister    Arthritis Sister    Colon polyps Sister    Arthritis Sister    Diabetes Brother    Kidney disease Brother    Arthritis Brother    Diabetes Brother    Prostate cancer Brother    Arthritis Brother    Diabetes Brother    Arthritis Brother    Cirrhosis Maternal Uncle        alcohol related   Stroke Paternal Grandmother    Colon cancer Paternal Grandmother    Heart attack Paternal Grandfather    Esophageal cancer Neg Hx    Stomach cancer Neg Hx    Pancreatic cancer Neg Hx     Social History   Socioeconomic History   Marital status: Married    Spouse name: Not on file   Number of children: 2   Years of education: Not on file   Highest education level: 12th grade  Occupational History   Occupation: Chief Strategy Officer: UNEMPLOYED  Tobacco Use   Smoking status: Never    Passive exposure: Past   Smokeless tobacco: Former    Types: Snuff    Quit date: 10/12/2004   Tobacco comments:    02/14/2013 "quit snuff in 2006"  Vaping Use   Vaping status: Never Used  Substance and Sexual Activity   Alcohol use: No   Drug use: No   Sexual activity: Yes    Birth control/protection: None  Other Topics Concern   Not on file  Social History Narrative   Lives in [place] with wife.  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   Married 1990   Social Drivers of Corporate investment banker Strain: Low Risk  (05/09/2023)   Overall Financial Resource Strain (CARDIA)    Difficulty of Paying Living Expenses: Not hard at all  Food Insecurity: No Food Insecurity (05/09/2023)   Hunger Vital Sign    Worried About  Running Out of Food in the Last Year: Never true    Ran Out of Food in the Last Year: Never true  Transportation Needs: No Transportation Needs (05/09/2023)   PRAPARE - Administrator, Civil Service (Medical): No    Lack of Transportation (Non-Medical): No  Physical Activity: Insufficiently Active (05/09/2023)   Exercise Vital Sign    Days of Exercise per Week: 3 days    Minutes of Exercise per Session: 40 min  Stress: No Stress Concern Present (05/09/2023)   [employer] of Occupational Health - Occupational Stress Questionnaire    Feeling of Stress : Not at all  Social Connections: Socially Integrated (05/09/2023)   Social Connection and Isolation Panel [NHANES]    Frequency of Communication with Friends and Family: More than three times a week    Frequency of Social Gatherings with Friends and Family: More than three times a week    Attends Religious Services: More than 4 times per year    Active Member of [employer] or Organizations: No    Attends Engineer, structural: More than 4 times per year    Marital Status: Married  Catering manager Violence: Not At Risk (07/08/2022)   Humiliation, Afraid, Rape, and Kick questionnaire    Fear of Current or Ex-Partner: No    Emotionally Abused: No    Physically Abused: No    Sexually Abused: No   Review of Systems Some chronic hearing loss---no sig recent change [place] the [place] last [place]---did have ear  popping with elevation changes (does chew gum to help this) Is careful about salt Furosemide only once a week--but spironolactone daliy    Objective:   Physical Exam Constitutional:      Appearance: Normal appearance.  HENT:     Right Ear: Tympanic membrane and ear canal normal.     Left Ear: Tympanic membrane and ear canal normal.  Eyes:     Extraocular Movements: Extraocular movements intact.     Comments: No nystagmus  Neurological:     Mental Status: He is alert.     Comments: Gait is normal Romberg  negative            Assessment & Plan:

## 2023-05-16 ENCOUNTER — Ambulatory Visit: Admitting: Family Medicine

## 2023-05-27 ENCOUNTER — Ambulatory Visit (INDEPENDENT_AMBULATORY_CARE_PROVIDER_SITE_OTHER): Admitting: Internal Medicine

## 2023-05-27 ENCOUNTER — Encounter: Payer: Self-pay | Admitting: Internal Medicine

## 2023-05-27 VITALS — BP 112/80 | HR 90 | Temp 97.7°F | Ht 76.0 in | Wt 284.0 lb

## 2023-05-27 DIAGNOSIS — J0140 Acute pansinusitis, unspecified: Secondary | ICD-10-CM | POA: Diagnosis not present

## 2023-05-27 MED ORDER — AMOXICILLIN-POT CLAVULANATE 875-125 MG PO TABS: 1.0000 | ORAL_TABLET | Freq: Two times a day (BID) | ORAL | 0 refills | Status: DC

## 2023-05-27 NOTE — Progress Notes (Signed)
 Subjective:    Patient ID: Aaron Thompson, male    DOB: Feb 10, 1960, 63 y.o.   MRN: [id number]  HPI Here due to sinus infection  Has had 3 days of drainage Thought it might be allergies-taking claritin Some help with flonase   Now 2 days of yellow nasal drainage Frontal headache No fever  Lots of sneezing Has post nasal drip and some sore throat Some ear pain--feels stopped Cough at times--worse in AM No SOB--or slight yesterday  Tried coricidin also  Current Outpatient Medications on File Prior to Visit  Medication Sig Dispense Refill   acetaminophen  (TYLENOL ) 325 MG tablet Take 650 mg by mouth every 6 (six) hours as needed for mild pain or moderate pain.     ALPRAZolam  (XANAX ) 0.25 MG tablet TAKE 1 TABLET BY MOUTH DAILY AS NEEDED FOR ANXIETY 30 tablet 2   Blood Glucose Monitoring Suppl (ONE TOUCH ULTRA 2) w/Device KIT Use daily to check sugars as needed. Dx E11.9 1 kit 0   busPIRone  (BUSPAR ) 5 MG tablet Take 1-2 tablets (5-10 mg total) by mouth 2 (two) times daily. 120 tablet 1   carvedilol  (COREG ) 25 MG tablet TAKE 2 TABLETS(50 MG) BY MOUTH TWICE DAILY WITH A MEAL 360 tablet 3   diltiazem  (CARDIZEM ) 30 MG tablet TAKE 1 TABLET BY MOUTH AS NEEDED, MAY REPEAT DOSE IN 4 HOURS ONCE IF NEEDED 90 tablet 0   dofetilide  (TIKOSYN ) 500 MCG capsule TAKE 1 CAPSULE(500 MCG) BY MOUTH TWICE DAILY 180 capsule 1   furosemide  (LASIX ) 40 MG tablet TAKE 1 TABLET BY MOUTH EVERY FRIDAY 12 tablet 3   glipiZIDE  (GLUCOTROL  XL) 10 MG 24 hr tablet TAKE 1 TABLET(10 MG) BY MOUTH DAILY WITH BREAKFAST 30 tablet 4   JARDIANCE  25 MG TABS tablet TAKE 1 TABLET BY MOUTH EVERY DAY 90 tablet 1   loratadine (CLARITIN) 10 MG tablet Take 10 mg by mouth daily as needed for allergies.     Magnesium  Oxide 400 MG CAPS Take 1 capsule (400 mg total) by mouth daily. 90 capsule 3   metFORMIN  (GLUCOPHAGE -XR) 500 MG 24 hr tablet TAKE 3 TABLETS(1500 MG) BY MOUTH DAILY WITH BREAKFAST 270 tablet 1   Multiple Vitamin (MULTIVITAMIN)  tablet Take 1 tablet by mouth daily.     omeprazole  (PRILOSEC) 40 MG capsule TAKE 1 CAPSULE(40 MG) BY MOUTH DAILY 90 capsule 0   ondansetron  (ZOFRAN ) 4 MG tablet Take 1 tablet (4 mg total) by mouth every 8 (eight) hours as needed for nausea or vomiting. 20 tablet 0   ONETOUCH ULTRA TEST test strip USE TO CHECK GLUCOSE TWICE DAILY AND AS NEEDED 100 strip 1   potassium chloride  SA (KLOR-CON  M) 20 MEQ tablet Take 2 tablets (40 mEq total) by mouth daily.     pravastatin  (PRAVACHOL ) 10 MG tablet Take 1 tablet (10 mg total) by mouth every Monday, Wednesday, and Friday.     rivaroxaban  (XARELTO ) 20 MG TABS tablet Take 1 tablet (20 mg total) by mouth daily with supper. 30 tablet 5   sacubitril -valsartan  (ENTRESTO ) 97-103 MG TAKE 1 TABLET BY MOUTH TWICE DAILY 180 tablet 1   spironolactone  (ALDACTONE ) 25 MG tablet TAKE 1 TABLET(25 MG) BY MOUTH DAILY 90 tablet 3   traMADol  (ULTRAM ) 50 MG tablet TAKE 1 TABLET(50 MG) BY MOUTH EVERY 12 HOURS AS NEEDED FOR PAIN 60 tablet 2   triamcinolone  cream (KENALOG ) 0.1 % Apply 1 Application topically 2 (two) times daily as needed. 45 g 2   No current facility-administered medications on  file prior to visit.    Allergies  Allergen Reactions   Atorvastatin      Muscle pain     Past Medical History:  Diagnosis Date   AICD (automatic cardioverter/defibrillator) present    Allergy 1968   Anxiety state, unspecified    Calculus of kidney 1981   "passed it on my own"   Cataract 2020   Chronic systolic CHF (congestive heart failure) (HCC)    Dermatophytosis of the body    DJD (degenerative joint disease)    Esophageal reflux    Hx of colonic polyps    Hypertension    LBBB (left bundle branch block)    intermittent   Non-ischemic cardiomyopathy (HCC)    a. cath 2013: minor nonobstructive CAD.   OSA (obstructive sleep apnea)    he can't tolerate CPAP.    Osteoarthrosis, unspecified whether generalized or localized, hand    Other chronic nonalcoholic liver  disease    fatty liver   PAF (paroxysmal atrial fibrillation) (HCC)    a. s/p DCCV 6/11; previously on Pradaxa;  b. Event Monitor 2012->No PAF;  c. 09/2011 s/p DCCV ->Xarelto  started. d. 03/2014: inappropriate ICD shocks for AF-RVR, started on Tikosyn , Xarelto  restarted.   Type II diabetes mellitus (HCC)    Unspecified hearing loss    no hearing aid    Past Surgical History:  Procedure Laterality Date   Abdominal US   01/2007   Fatty liver, no gallstones   APPENDECTOMY     ATRIAL TACH ABLATION  09/2015   BI-VENTRICULAR IMPLANTABLE CARDIOVERTER DEFIBRILLATOR [name] 02/14/2013   STJ CRTD implanted by Dr Rodolfo Clan   BILATERAL KNEE ARTHROSCOPY     BIV ICD GENERATOR CHANGEOUT [name] 06/06/2019   Procedure: BIV ICD GENERATOR CHANGEOUT;  Surgeon: Verona Goodwill, MD;  Location: [facility] INVASIVE CV LAB;  Service: Cardiovascular;  Laterality: [name];   CARDIOVERSION  09/29/2011   Procedure: CARDIOVERSION;  Surgeon: Lake Pilgrim, MD;  Location: [facility] OR;  Service: Cardiovascular;  Laterality: [name];   COLONOSCOPY WITH PROPOFOL  [name] 10/26/2016   Procedure: COLONOSCOPY WITH PROPOFOL ;  Surgeon: Tobin Forts, MD;  Location: [facility] ENDOSCOPY;  Service: Endoscopy;  Laterality: [name];   DOPPLER ECHOCARDIOGRAPHY  11/2009   Decreased EF of 35-40%   ELECTROPHYSIOLOGIC STUDY [name] 10/14/2015   Procedure: Atrial Fibrillation Ablation;  Surgeon: Jolly Needle, MD;  Location: [facility] INVASIVE CV LAB;  Service: Cardiovascular;  Laterality: [name];   EYE SURGERY  December 2020   LEFT HEART CATHETERIZATION WITH CORONARY ANGIOGRAM [name] 08/18/2011   Procedure: LEFT HEART CATHETERIZATION WITH CORONARY ANGIOGRAM;  Surgeon: Peter M Swaziland, MD;  Location: [facility] CATH LAB;  Service: Cardiovascular;  Laterality: [name];   NECK SURGERY     plate/fusion   Stress Cardiolite  08/2005   Normal, EF 42%   UPPER GASTROINTESTINAL ENDOSCOPY  08/2006   GERD    Family History  Problem Relation Age of Onset   Hypertension Mother    Diabetes Mother    Heart failure  Mother        Died @ 19   Arthritis Mother    Heart disease Mother    Hypertension Father    Heart failure Father        Died @ 76   Prostate cancer Father    Arthritis Father    Hearing loss Father    Heart disease Father    Other Sister    Arthritis Sister    Multiple myeloma Sister    Leukemia Sister    Arthritis  Sister    Cancer Sister    Arthritis Sister    Colon polyps Sister    Arthritis Sister    Diabetes Brother    Kidney disease Brother    Arthritis Brother    Diabetes Brother    Prostate cancer Brother    Arthritis Brother    Diabetes Brother    Arthritis Brother    Cirrhosis Maternal Uncle        alcohol related   Stroke Paternal Grandmother    Colon cancer Paternal Grandmother    Heart attack Paternal Grandfather    Esophageal cancer Neg Hx    Stomach cancer Neg Hx    Pancreatic cancer Neg Hx     Social History   Socioeconomic History   Marital status: Married    Spouse name: Not on file   Number of children: 2   Years of education: Not on file   Highest education level: 12th grade  Occupational History   Occupation: Chief Strategy Officer: UNEMPLOYED  Tobacco Use   Smoking status: Never    Passive exposure: Past   Smokeless tobacco: Former    Types: Snuff    Quit date: 10/12/2004   Tobacco comments:    02/14/2013 "quit snuff in 2006"  Vaping Use   Vaping status: Never Used  Substance and Sexual Activity   Alcohol use: No   Drug use: No   Sexual activity: Yes    Birth control/protection: None  Other Topics Concern   Not on file  Social History Narrative   Lives in [place] with wife.  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   Married 1990   Social Drivers of Corporate investment banker Strain: Low Risk  (05/09/2023)   Overall Financial Resource Strain (CARDIA)    Difficulty of Paying Living Expenses: Not hard at all  Food Insecurity: No Food Insecurity (05/09/2023)   Hunger Vital Sign    Worried About Running Out of Food in  the Last Year: Never true    Ran Out of Food in the Last Year: Never true  Transportation Needs: No Transportation Needs (05/09/2023)   PRAPARE - Administrator, Civil Service (Medical): No    Lack of Transportation (Non-Medical): No  Physical Activity: Insufficiently Active (05/09/2023)   Exercise Vital Sign    Days of Exercise per Week: 3 days    Minutes of Exercise per Session: 40 min  Stress: No Stress Concern Present (05/09/2023)   [employer] of Occupational Health - Occupational Stress Questionnaire    Feeling of Stress : Not at all  Social Connections: Socially Integrated (05/09/2023)   Social Connection and Isolation Panel [NHANES]    Frequency of Communication with Friends and Family: More than three times a week    Frequency of Social Gatherings with Friends and Family: More than three times a week    Attends Religious Services: More than 4 times per year    Active Member of [employer] or Organizations: No    Attends Engineer, structural: More than 4 times per year    Marital Status: Married  Catering manager Violence: Not At Risk (07/08/2022)   Humiliation, Afraid, Rape, and Kick questionnaire    Fear of Current or Ex-Partner: No    Emotionally Abused: No    Physically Abused: No    Sexually Abused: No   Review of Systems Vertigo is better--not as bad lying down Still going to mountains to see son frequently No N/V  Eating okay    Objective:   Physical Exam Constitutional:      Appearance: Normal appearance.  HENT:     Head:     Comments: Frontal > maxillary tenderness    Right Ear: Tympanic membrane and ear canal normal.     Left Ear: Tympanic membrane and ear canal normal.     Mouth/Throat:     Pharynx: No oropharyngeal exudate or posterior oropharyngeal erythema.  Pulmonary:     Effort: Pulmonary effort is normal.     Breath sounds: Normal breath sounds. No wheezing or rales.  Musculoskeletal:     Cervical back: Neck supple.   Lymphadenopathy:     Cervical: No cervical adenopathy.  Neurological:     Mental Status: He is alert.            Assessment & Plan:

## 2023-05-27 NOTE — Assessment & Plan Note (Signed)
 Could be allergy or viral mediated Continue flonase , tylenol  If worsens, can start augmentin  875 bid x 7 days

## 2023-05-31 ENCOUNTER — Other Ambulatory Visit: Payer: Self-pay | Admitting: Family Medicine

## 2023-06-01 ENCOUNTER — Ambulatory Visit (INDEPENDENT_AMBULATORY_CARE_PROVIDER_SITE_OTHER): Payer: HMO

## 2023-06-01 DIAGNOSIS — I428 Other cardiomyopathies: Secondary | ICD-10-CM

## 2023-06-01 LAB — CUP PACEART REMOTE DEVICE CHECK
Battery Remaining Longevity: 36 mo
Battery Remaining Percentage: 42 %
Battery Voltage: 2.93 V
Brady Statistic AP VP Percent: 1.4 %
Brady Statistic AP VS Percent: 1 %
Brady Statistic AS VP Percent: 93 %
Brady Statistic AS VS Percent: 2.7 %
Brady Statistic RA Percent Paced: 1 %
Date Time Interrogation Session: 20250507042043
HighPow Impedance: 81 Ohm
HighPow Impedance: 81 Ohm
Implantable Lead Connection Status: 753985
Implantable Lead Connection Status: 753985
Implantable Lead Connection Status: 753985
Implantable Lead Implant Date: 20150121
Implantable Lead Implant Date: 20150121
Implantable Lead Implant Date: 20150121
Implantable Lead Location: 753858
Implantable Lead Location: 753859
Implantable Lead Location: 753860
Implantable Lead Model: 7122
Implantable Pulse Generator Implant Date: 20210512
Lead Channel Impedance Value: 450 Ohm
Lead Channel Impedance Value: 510 Ohm
Lead Channel Impedance Value: 630 Ohm
Lead Channel Pacing Threshold Amplitude: 0.875 V
Lead Channel Pacing Threshold Amplitude: 1.125 V
Lead Channel Pacing Threshold Amplitude: 1.375 V
Lead Channel Pacing Threshold Pulse Width: 0.4 ms
Lead Channel Pacing Threshold Pulse Width: 0.4 ms
Lead Channel Pacing Threshold Pulse Width: 0.5 ms
Lead Channel Sensing Intrinsic Amplitude: 11.7 mV
Lead Channel Sensing Intrinsic Amplitude: 4.8 mV
Lead Channel Setting Pacing Amplitude: 1.875
Lead Channel Setting Pacing Amplitude: 2.125
Lead Channel Setting Pacing Amplitude: 2.375
Lead Channel Setting Pacing Pulse Width: 0.4 ms
Lead Channel Setting Pacing Pulse Width: 0.5 ms
Lead Channel Setting Sensing Sensitivity: 0.5 mV
Pulse Gen Serial Number: 9890691

## 2023-06-03 ENCOUNTER — Ambulatory Visit (INDEPENDENT_AMBULATORY_CARE_PROVIDER_SITE_OTHER)
Admission: RE | Admit: 2023-06-03 | Discharge: 2023-06-03 | Disposition: A | Discharge status: Home / Self Care | Source: Ambulatory Visit | Attending: Family Medicine | Admitting: Family Medicine

## 2023-06-03 ENCOUNTER — Encounter: Payer: Self-pay | Admitting: Family Medicine

## 2023-06-03 ENCOUNTER — Ambulatory Visit (INDEPENDENT_AMBULATORY_CARE_PROVIDER_SITE_OTHER): Admitting: Family Medicine

## 2023-06-03 VITALS — BP 112/74 | HR 83 | Temp 98.1°F | Ht 76.0 in | Wt 284.2 lb

## 2023-06-03 DIAGNOSIS — E119 Type 2 diabetes mellitus without complications: Secondary | ICD-10-CM | POA: Diagnosis not present

## 2023-06-03 DIAGNOSIS — R059 Cough, unspecified: Secondary | ICD-10-CM

## 2023-06-03 DIAGNOSIS — Z95810 Presence of automatic (implantable) cardiac defibrillator: Secondary | ICD-10-CM | POA: Diagnosis not present

## 2023-06-03 DIAGNOSIS — R9389 Abnormal findings on diagnostic imaging of other specified body structures: Secondary | ICD-10-CM | POA: Diagnosis not present

## 2023-06-03 LAB — POCT GLYCOSYLATED HEMOGLOBIN (HGB A1C): Hemoglobin A1C: 8.1 % — AB (ref 4.0–5.6)

## 2023-06-03 MED ORDER — PREDNISONE 10 MG PO TABS
ORAL_TABLET | ORAL | 0 refills | Status: DC
Start: 2023-06-03 — End: 2023-07-19

## 2023-06-03 MED ORDER — HYDROCODONE BIT-HOMATROP MBR 5-1.5 MG/5ML PO SOLN: 5.0000 mL | Freq: Three times a day (TID) | ORAL | 0 refills | Status: DC | PRN

## 2023-06-03 NOTE — Patient Instructions (Addendum)
 Go to the lab on the way out.   If you have mychart we'll likely use that to update you.    Finish the augmentin  and we'll update you about the xray.  Rest and fluids, mucinex .  Take prednisone 2 a day for 5 days, then 1 a day for 5 days, with food. Don't take with aleve/ibuprofen . Use the cough syrup if needed.  Take care.  Glad to see you. Update us  as needed.   Recheck A1c at a visit in about 3 months.

## 2023-06-03 NOTE — Progress Notes (Unsigned)
 He had sinus sx initially then chest congestion.  Sx worse at night.  More wheeze at night.  Finishing augmentin  today.    Still on beta blocker and tikosyn  at baseline.   His cough is the main issue.  More cough with deep breath.  No fevers.  Still with runny nose.  Taking plain mucinex .  Routine cautions d/w pt.  Prev facial pain is better.  Sugar was 169 this AM.    Meds, vitals, and allergies reviewed.   ROS: Per HPI unless specifically indicated in ROS section   Nad Ncat TM wnl OP wnl Neck supple, no LA Rrr B exp wheeze w/o focal dec in BS Cough with deep breath.  Not SOB, speaking in complete sentences.  Skin well perfused.  No BLE edema.

## 2023-06-05 ENCOUNTER — Telehealth: Payer: Self-pay | Admitting: Family Medicine

## 2023-06-05 ENCOUNTER — Encounter: Payer: Self-pay | Admitting: Family Medicine

## 2023-06-05 DIAGNOSIS — R059 Cough, unspecified: Secondary | ICD-10-CM | POA: Insufficient documentation

## 2023-06-05 NOTE — Assessment & Plan Note (Signed)
 No change in meds.  We need to address his cough first. Recheck A1c at a visit in about 3 months.

## 2023-06-05 NOTE — Telephone Encounter (Signed)
 Opened in error

## 2023-06-05 NOTE — Assessment & Plan Note (Signed)
 See notes on chest x-ray. Finish the augmentin .  Rest and fluids, mucinex .  Take prednisone 2 a day for 5 days, then 1 a day for 5 days, with food. Don't take with aleve/ibuprofen . Use Hycodan if needed.  Sedation caution discussed with patient.

## 2023-06-09 ENCOUNTER — Other Ambulatory Visit: Payer: Self-pay | Admitting: Family Medicine

## 2023-06-10 ENCOUNTER — Other Ambulatory Visit: Payer: Self-pay | Admitting: Cardiovascular Disease

## 2023-06-10 NOTE — Telephone Encounter (Signed)
 Last office visit: 06/03/23 Next office visit: nothing scheduled Last refill:  TRAMADOL  50MG  TABLETS  03/09/23 # 60 tablets 2 refills

## 2023-06-21 ENCOUNTER — Other Ambulatory Visit: Payer: Self-pay | Admitting: Family Medicine

## 2023-06-22 ENCOUNTER — Encounter: Payer: Self-pay | Admitting: Family Medicine

## 2023-06-22 ENCOUNTER — Ambulatory Visit: Payer: Self-pay | Admitting: Cardiology

## 2023-06-22 ENCOUNTER — Other Ambulatory Visit: Payer: Self-pay

## 2023-06-22 MED ORDER — GLIPIZIDE ER 10 MG PO TB24: ORAL_TABLET | ORAL | 4 refills | Status: DC

## 2023-06-28 ENCOUNTER — Other Ambulatory Visit: Payer: Self-pay | Admitting: Internal Medicine

## 2023-06-28 DIAGNOSIS — I4891 Unspecified atrial fibrillation: Secondary | ICD-10-CM

## 2023-06-29 NOTE — Telephone Encounter (Signed)
 Xarelto  20mg  refill request received. Pt is 63 years old, weight-128.9kg, Crea-0.81 on 02/04/23, last seen by Dr. Stann Earnest on 09/06/22, Diagnosis-Afib, CrCl-174.61 mL/min; Dose is appropriate based on dosing criteria. Will send in refill to requested pharmacy.

## 2023-07-05 ENCOUNTER — Other Ambulatory Visit: Payer: Self-pay | Admitting: Family Medicine

## 2023-07-11 ENCOUNTER — Encounter

## 2023-07-12 ENCOUNTER — Other Ambulatory Visit: Payer: Self-pay | Admitting: Family Medicine

## 2023-07-13 NOTE — Progress Notes (Signed)
 Remote ICD transmission.

## 2023-07-15 ENCOUNTER — Ambulatory Visit

## 2023-07-15 VITALS — Ht 76.0 in | Wt 284.0 lb

## 2023-07-15 DIAGNOSIS — E119 Type 2 diabetes mellitus without complications: Secondary | ICD-10-CM | POA: Diagnosis not present

## 2023-07-15 DIAGNOSIS — Z0000 Encounter for general adult medical examination without abnormal findings: Secondary | ICD-10-CM | POA: Diagnosis not present

## 2023-07-15 NOTE — Progress Notes (Signed)
 Please attest and cosign this visit due to patients primary care provider not being in the office at the time the visit was completed.    Subjective:   Aaron Thompson is a 63 y.o. who presents for a Medicare Wellness preventive visit.  As a reminder, Annual Wellness Visits don't include a physical exam, and some assessments may be limited, especially if this visit is performed virtually. We may recommend an in-person follow-up visit with your provider if needed.  Visit Complete: Virtual I connected with  Aaron Thompson on 07/15/23 by a audio enabled telemedicine application and verified that I am speaking with the correct person using two identifiers.  Patient Location: Home  Provider Location: Office/Clinic  I discussed the limitations of evaluation and management by telemedicine. The patient expressed understanding and agreed to proceed.  Vital Signs: Because this visit was a virtual/telehealth visit, some criteria may be missing or patient reported. Any vitals not documented were not able to be obtained and vitals that have been documented are patient reported.  VideoDeclined- This patient declined Librarian, academic. Therefore the visit was completed with audio only.  Persons Participating in Visit: Patient.  AWV Questionnaire: No: Patient Medicare AWV questionnaire was not completed prior to this visit.  Cardiac Risk Factors include: advanced age (>48men, >40 women);diabetes mellitus;dyslipidemia;male gender;obesity (BMI >30kg/m2)     Objective:    Today's Vitals   07/15/23 1139  Weight: 284 lb (128.8 kg)  Height: 6' 4 (1.93 m)   Body mass index is 34.57 kg/m.     07/15/2023   11:58 AM 07/08/2022    9:57 AM 07/06/2021    9:57 AM 07/03/2020   10:01 AM 06/06/2019   10:17 AM 05/15/2019    3:38 PM 10/26/2016    9:22 AM  Advanced Directives  Does Patient Have a Medical Advance Directive? Yes No No Yes Yes Yes Yes   Type of Special educational needs teacher of [place];Living will   Healthcare Power of [place];Living will Healthcare Power of [place];Living will Healthcare Power of [place];Living will Living will  Does patient want to make changes to medical advance directive?     No - Patient declined    Copy of Healthcare Power of Attorney in Chart? No - copy requested   No - copy requested  No - copy requested No - copy requested   Would patient like information on creating a medical advance directive?  No - Patient declined No - Patient declined         Data saved with a previous flowsheet row definition    Current Medications (verified) Outpatient Encounter Medications as of 07/15/2023  Medication Sig   acetaminophen  (TYLENOL ) 325 MG tablet Take 650 mg by mouth every 6 (six) hours as needed for mild pain or moderate pain.   Blood Glucose Monitoring Suppl (ONE TOUCH ULTRA 2) w/Device KIT Use daily to check sugars as needed. Dx E11.9   busPIRone  (BUSPAR ) 5 MG tablet TAKE 1 TO 2 TABLETS(5 TO 10 MG) BY MOUTH TWICE DAILY   carvedilol  (COREG ) 25 MG tablet TAKE 2 TABLETS(50 MG) BY MOUTH TWICE DAILY WITH A MEAL   diltiazem  (CARDIZEM ) 30 MG tablet TAKE 1 TABLET BY MOUTH AS NEEDED, MAY REPEAT DOSE IN 4 HOURS ONCE IF NEEDED   dofetilide  (TIKOSYN ) 500 MCG capsule TAKE 1 CAPSULE(500 MCG) BY MOUTH TWICE DAILY   furosemide  (LASIX ) 40 MG tablet TAKE 1 TABLET BY MOUTH EVERY FRIDAY   glipiZIDE  (GLUCOTROL  XL) 10 MG  24 hr tablet TAKE 1 TABLET(10 MG) BY MOUTH DAILY WITH BREAKFAST   JARDIANCE  25 MG TABS tablet TAKE 1 TABLET BY MOUTH EVERY DAY   loratadine (CLARITIN) 10 MG tablet Take 10 mg by mouth daily as needed for allergies.   Magnesium  Oxide 400 MG CAPS Take 1 capsule (400 mg total) by mouth daily.   Multiple Vitamin (MULTIVITAMIN) tablet Take 1 tablet by mouth daily.   omeprazole  (PRILOSEC) 40 MG capsule TAKE 1 CAPSULE(40 MG) BY MOUTH DAILY   ONETOUCH ULTRA TEST test strip USE TO CHECK GLUCOSE TWICE DAILY AND AS NEEDED   potassium  chloride SA (KLOR-CON  M) 20 MEQ tablet TAKE 2 TABLETS BY MOUTH TWICE DAILY   pravastatin  (PRAVACHOL ) 10 MG tablet Take 1 tablet (10 mg total) by mouth every Monday, Wednesday, and Friday.   sacubitril -valsartan  (ENTRESTO ) 97-103 MG TAKE 1 TABLET BY MOUTH TWICE DAILY   spironolactone  (ALDACTONE ) 25 MG tablet TAKE 1 TABLET(25 MG) BY MOUTH DAILY   traMADol  (ULTRAM ) 50 MG tablet TAKE 1 TABLET(50 MG) BY MOUTH EVERY 12 HOURS AS NEEDED FOR PAIN   triamcinolone  cream (KENALOG ) 0.1 % Apply 1 Application topically 2 (two) times daily as needed.   XARELTO  20 MG TABS tablet TAKE 1 TABLET(20 MG) BY MOUTH DAILY WITH SUPPER   amoxicillin -clavulanate (AUGMENTIN ) 875-125 MG tablet Take 1 tablet by mouth 2 (two) times daily. (Patient not taking: Reported on 07/15/2023)   HYDROcodone  bit-homatropine (HYCODAN) 5-1.5 MG/5ML syrup Take 5 mLs by mouth every 8 (eight) hours as needed for cough (sedation caution, don't take with tramadol .). (Patient not taking: Reported on 07/15/2023)   metFORMIN  (GLUCOPHAGE -XR) 500 MG 24 hr tablet TAKE 3 TABLETS(1500 MG) BY MOUTH DAILY WITH BREAKFAST   ondansetron  (ZOFRAN ) 4 MG tablet Take 1 tablet (4 mg total) by mouth every 8 (eight) hours as needed for nausea or vomiting. (Patient not taking: Reported on 07/15/2023)   predniSONE  (DELTASONE ) 10 MG tablet Take 2 a day for 5 days, then 1 a day for 5 days, with food. Don't take with aleve/ibuprofen . (Patient not taking: Reported on 07/15/2023)   No facility-administered encounter medications on file as of 07/15/2023.    Allergies (verified) Atorvastatin    History: Past Medical History:  Diagnosis Date   AICD (automatic cardioverter/defibrillator) present    Allergy 1968   Anxiety state, unspecified    Calculus of kidney 1981   passed it on my own   Cataract 2020   Chronic systolic CHF (congestive heart failure) (HCC)    Dermatophytosis of the body    DJD (degenerative joint disease)    Esophageal reflux    Hx of colonic polyps     Hypertension    LBBB (left bundle branch block)    intermittent   Non-ischemic cardiomyopathy (HCC)    a. cath 2013: minor nonobstructive CAD.   OSA (obstructive sleep apnea)    he can't tolerate CPAP.    Osteoarthrosis, unspecified whether generalized or localized, hand    Other chronic nonalcoholic liver disease    fatty liver   PAF (paroxysmal atrial fibrillation) (HCC)    a. s/p DCCV 6/11; previously on Pradaxa;  b. Event Monitor 2012->No PAF;  c. 09/2011 s/p DCCV ->Xarelto  started. d. 03/2014: inappropriate ICD shocks for AF-RVR, started on Tikosyn , Xarelto  restarted.   Type II diabetes mellitus (HCC)    Unspecified hearing loss    no hearing aid   Past Surgical History:  Procedure Laterality Date   Abdominal US   01/2007   Fatty liver, no gallstones  APPENDECTOMY     ATRIAL TACH ABLATION  09/2015   BI-VENTRICULAR IMPLANTABLE CARDIOVERTER DEFIBRILLATOR [name] 02/14/2013   STJ CRTD implanted by Dr Rodolfo Clan   BILATERAL KNEE ARTHROSCOPY     BIV ICD GENERATOR CHANGEOUT [name] 06/06/2019   Procedure: BIV ICD GENERATOR CHANGEOUT;  Surgeon: Verona Goodwill, MD;  Location: [facility] INVASIVE CV LAB;  Service: Cardiovascular;  Laterality: [name];   CARDIOVERSION  09/29/2011   Procedure: CARDIOVERSION;  Surgeon: Lake Pilgrim, MD;  Location: [facility] OR;  Service: Cardiovascular;  Laterality: [name];   COLONOSCOPY WITH PROPOFOL  [name] 10/26/2016   Procedure: COLONOSCOPY WITH PROPOFOL ;  Surgeon: Tobin Forts, MD;  Location: [facility] ENDOSCOPY;  Service: Endoscopy;  Laterality: [name];   DOPPLER ECHOCARDIOGRAPHY  11/2009   Decreased EF of 35-40%   ELECTROPHYSIOLOGIC STUDY [name] 10/14/2015   Procedure: Atrial Fibrillation Ablation;  Surgeon: Jolly Needle, MD;  Location: [facility] INVASIVE CV LAB;  Service: Cardiovascular;  Laterality: [name];   EYE SURGERY  December 2020   LEFT HEART CATHETERIZATION WITH CORONARY ANGIOGRAM [name] 08/18/2011   Procedure: LEFT HEART CATHETERIZATION WITH CORONARY ANGIOGRAM;  Surgeon: Peter M Swaziland, MD;   Location: [facility] CATH LAB;  Service: Cardiovascular;  Laterality: [name];   NECK SURGERY     plate/fusion   Stress Cardiolite  08/2005   Normal, EF 42%   UPPER GASTROINTESTINAL ENDOSCOPY  08/2006   GERD   Family History  Problem Relation Age of Onset   Hypertension Mother    Diabetes Mother    Heart failure Mother        Died @ 67   Arthritis Mother    Heart disease Mother    Hypertension Father    Heart failure Father        Died @ 38   Prostate cancer Father    Arthritis Father    Hearing loss Father    Heart disease Father    Other Sister    Arthritis Sister    Multiple myeloma Sister    Leukemia Sister    Arthritis Sister    Cancer Sister    Arthritis Sister    Colon polyps Sister    Arthritis Sister    Diabetes Brother    Kidney disease Brother    Arthritis Brother    Diabetes Brother    Prostate cancer Brother    Arthritis Brother    Diabetes Brother    Arthritis Brother    Cirrhosis Maternal Uncle        alcohol related   Stroke Paternal Grandmother    Colon cancer Paternal Grandmother    Heart attack Paternal Grandfather    Esophageal cancer Neg Hx    Stomach cancer Neg Hx    Pancreatic cancer Neg Hx    Social History   Socioeconomic History   Marital status: Married    Spouse name: Not on file   Number of children: 2   Years of education: Not on file   Highest education level: 12th grade  Occupational History   Occupation: Chief Strategy Officer: UNEMPLOYED  Tobacco Use   Smoking status: Never    Passive exposure: Past   Smokeless tobacco: Former    Types: Snuff    Quit date: 10/12/2004   Tobacco comments:    02/14/2013 quit snuff in 2006  Vaping Use   Vaping status: Never Used  Substance and Sexual Activity   Alcohol use: No   Drug use: No   Sexual activity: Yes    Birth control/protection: None  Other Topics Concern   Not on file  Social History Narrative   Lives in [place] with wife.  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    Married 1990   Social Drivers of Corporate investment banker Strain: Low Risk  (07/15/2023)   Overall Financial Resource Strain (CARDIA)    Difficulty of Paying Living Expenses: Not hard at all  Food Insecurity: No Food Insecurity (07/15/2023)   Hunger Vital Sign    Worried About Running Out of Food in the Last Year: Never true    Ran Out of Food in the Last Year: Never true  Transportation Needs: No Transportation Needs (07/15/2023)   PRAPARE - Administrator, Civil Service (Medical): No    Lack of Transportation (Non-Medical): No  Physical Activity: Insufficiently Active (07/15/2023)   Exercise Vital Sign    Days of Exercise per Week: 3 days    Minutes of Exercise per Session: 40 min  Stress: No Stress Concern Present (07/15/2023)   [employer] of Occupational Health - Occupational Stress Questionnaire    Feeling of Stress: Not at all  Social Connections: Socially Integrated (07/15/2023)   Social Connection and Isolation Panel    Frequency of Communication with Friends and Family: More than three times a week    Frequency of Social Gatherings with Friends and Family: More than three times a week    Attends Religious Services: More than 4 times per year    Active Member of [employer] or Organizations: Yes    Attends Banker Meetings: 1 to 4 times per year    Marital Status: Married    Tobacco Counseling Counseling given: Not Answered Tobacco comments: 02/14/2013 quit snuff in 2006   Clinical Intake:  Pre-visit preparation completed: Yes  Pain : No/denies pain     BMI - recorded: 34.57 Nutritional Status: BMI > 30  Obese Nutritional Risks: None Diabetes: Yes CBG done?: Yes (BS 172 this am at home) Did pt. bring in CBG monitor from home?: No  Lab Results  Component Value Date   HGBA1C 8.1 (A) 06/03/2023   HGBA1C 8.3 (H) 02/04/2023   HGBA1C 7.7 (H) 07/02/2022     How often do you need to have someone help you when you read instructions,  pamphlets, or other written materials from your doctor or pharmacy?: 1 - Never  Interpreter Needed?: No  Comments: lives with wife Information entered by :: B.Latesha Chesney,LPN   Activities of Daily Living     07/15/2023   11:59 AM  In your present state of health, do you have any difficulty performing the following activities:  Hearing? 1  Vision? 0  Difficulty concentrating or making decisions? 0  Walking or climbing stairs? 0  Dressing or bathing? 0  Doing errands, shopping? 0  Preparing Food and eating ? N  Using the Toilet? N  In the past six months, have you accidently leaked urine? N  Do you have problems with loss of bowel control? N  Managing your Medications? N  Managing your Finances? N  Housekeeping or managing your Housekeeping? N    Patient Care Team: Donnie Galea, MD as PCP - General (Family Medicine) Loyde Rule, MD as PCP - Cardiology (Cardiology) Verona Goodwill, MD as PCP - Electrophysiology (Cardiology) Jonathan Neighbor, [facility] (Inactive) as Pharmacist (Pharmacist) [facility], [place]  I have updated your Care Teams any recent Medical Services you may have received from other providers in the past year.  Assessment:   This is a routine wellness examination for [employer].  Hearing/Vision screen Hearing Screening - Comments:: Pt says his hearing is not as good on rt side but hears adequately Vision Screening - Comments:: Pt says his vision w/glasses [facility]-due for exam:will schedule   Goals Addressed             This Visit's Progress    COMPLETED: DIET - EAT MORE FRUITS AND VEGETABLES   On track    Patient Stated   On track    07/15/23, I will maintain and continue medications as prescribed.     Patient Stated   On track    07/15/23, I will continue to walk daily for about 20 minutes.     Patient Stated   Not on track    07/15/23-Lose weight       Depression Screen     07/15/2023   11:55 AM 05/27/2023    8:58 AM  05/10/2023    9:46 AM 02/04/2023    8:27 AM 07/09/2022    9:02 AM 07/08/2022    9:56 AM 07/06/2021    9:55 AM  PHQ 2/9 Scores  PHQ - 2 Score 0 0 0 0 0 0 0  PHQ- 9 Score    0 0  0    Fall Risk     07/15/2023   11:44 AM 05/27/2023    8:57 AM 05/10/2023    9:46 AM 02/04/2023    8:27 AM 07/08/2022    9:47 AM  Fall Risk   Falls in the past year? 1 0 0 0 0  Comment missed a step at home      Number falls in past yr: 0 0 0 0 0  Injury with Fall? 0 0 0 0 0  Risk for fall due to : No Fall Risks No Fall Risks No Fall Risks No Fall Risks No Fall Risks  Follow up Education provided;Falls prevention discussed Falls evaluation completed Falls evaluation completed Falls evaluation completed Falls prevention discussed;Falls evaluation completed    MEDICARE RISK AT HOME:  Medicare Risk at Home Any stairs in or around the home?: Yes If so, are there any without handrails?: Yes Home free of loose throw rugs in walkways, pet beds, electrical cords, etc?: Yes Adequate lighting in your home to reduce risk of falls?: Yes Life alert?: No Use of a cane, walker or w/c?: No Grab bars in the bathroom?: No Shower chair or bench in shower?: Yes Elevated toilet seat or a handicapped toilet?: Yes  TIMED UP AND GO:  Was the test performed?  No  Cognitive Function: 6CIT completed    07/03/2020   10:09 AM 05/15/2019    3:45 PM  MMSE - Mini Mental State Exam  Not completed: Refused   Orientation to time  5  Orientation to Place  5  Registration  3  Attention/ Calculation  5  Recall  3  Language- repeat  1        07/15/2023   12:02 PM 07/08/2022    9:59 AM  6CIT Screen  What Year? 0 points 0 points  What month? 0 points 0 points  What time? 0 points 0 points  Count back from 20 0 points 0 points  Months in reverse 0 points 0 points  Repeat phrase 2 points 2 points  Total Score 2 points 2 points    Immunizations Immunization History  Administered Date(s) Administered   Influenza, Seasonal,  Injecte, Preservative Fre 12/03/2013,  11/19/2022   Influenza,inj,Quad PF,6+ Mos 10/05/2016, 11/04/2017, 12/01/2018, 01/22/2021, 12/25/2021   Influenza-Unspecified 12/09/2012, 11/24/2014, 11/20/2019   Moderna Sars-Covid-2 Vaccination 05/13/2019, 06/12/2019, 10/04/2019   Pneumococcal Polysaccharide-23 09/30/2011   Td 07/23/2015   Zoster Recombinant(Shingrix) 03/09/2022, 05/14/2022    Screening Tests Health Maintenance  Topic Date Due   Pneumococcal Vaccine 37-62 Years old (2 of 2 - PCV) 09/29/2012   Diabetic kidney evaluation - Urine ACR  07/02/2023   OPHTHALMOLOGY EXAM  06/22/2023   INFLUENZA VACCINE  08/26/2023   HEMOGLOBIN A1C  12/04/2023   Diabetic kidney evaluation - eGFR measurement  02/04/2024   FOOT EXAM  02/04/2024   Medicare Annual Wellness (AWV)  07/14/2024   DTaP/Tdap/Td (2 - Tdap) 07/22/2025   Colonoscopy  10/27/2026   Hepatitis C Screening  Completed   HIV Screening  Completed   Zoster Vaccines- Shingrix  Completed   HPV VACCINES  Aged Out   Meningococcal B Vaccine  Aged Out   COVID-19 Vaccine  Discontinued    Health Maintenance  Health Maintenance Due  Topic Date Due   Pneumococcal Vaccine 24-5 Years old (2 of 2 - PCV) 09/29/2012   Diabetic kidney evaluation - Urine ACR  07/02/2023   OPHTHALMOLOGY EXAM  06/22/2023   Health Maintenance Items Addressed: Pt to make appt w/[facility] for DM eye exam  Additional Screening:  Vision Screening: Recommended annual ophthalmology exams for early detection of glaucoma and other disorders of the eye. Would you like a referral to an eye doctor? No    Dental Screening: Recommended annual dental exams for proper oral hygiene  Community Resource Referral / Chronic Care Management: CRR required this visit?  No   CCM required this visit?  Appt scheduled with PCP   Plan:    I have personally reviewed and noted the following in the patient's chart:   Medical and social history Use of alcohol, tobacco or illicit  drugs  Current medications and supplements including opioid prescriptions. Patient is currently taking opioid prescriptions. Information provided to patient regarding non-opioid alternatives. Patient advised to discuss non-opioid treatment plan with their provider. Functional ability and status Nutritional status Physical activity Advanced directives List of other physicians Hospitalizations, surgeries, and ER visits in previous 12 months Vitals Screenings to include cognitive, depression, and falls Referrals and appointments  In addition, I have reviewed and discussed with patient certain preventive protocols, quality metrics, and best practice recommendations. A written personalized care plan for preventive services as well as general preventive health recommendations were provided to patient.   Nerissa Bannister, LPN   09/23/5619   After Visit Summary: (MyChart) Due to this being a telephonic visit, the after visit summary with patients personalized plan was offered to patient via MyChart   Notes: Nothing significant to report at this time.

## 2023-07-15 NOTE — Addendum Note (Signed)
 Addended by: Mirayah Wren E on: 07/15/2023 01:46 PM   Modules accepted: Orders

## 2023-07-15 NOTE — Patient Instructions (Signed)
 Mr. Aaron Thompson , Thank you for taking time out of your busy schedule to complete your Annual Wellness Visit with me. I enjoyed our conversation and look forward to speaking with you again next year. I, as well as your care team,  appreciate your ongoing commitment to your health goals. Please review the following plan we discussed and let me know if I can assist you in the future. Your Game plan/ To Do List     Follow up Visits: Next Medicare AWV with our clinical staff: 07/18/24 @ 11:30 am televisit   Have you seen your provider in the last 6 months (3 months if uncontrolled diabetes)? Yes Next Office Visit with your provider: 08/29/23 Physical  Clinician Recommendations:  Aim for 30 minutes of exercise or brisk walking, 6-8 glasses of water, and 5 servings of fruits and vegetables each day.       This is a list of the screening recommended for you and due dates:  Health Maintenance  Topic Date Due   Pneumococcal Vaccination (2 of 2 - PCV) 09/29/2012   Yearly kidney health urinalysis for diabetes  07/02/2023   Eye exam for diabetics  06/22/2023   Flu Shot  08/26/2023   Hemoglobin A1C  12/04/2023   Yearly kidney function blood test for diabetes  02/04/2024   Complete foot exam   02/04/2024   Medicare Annual Wellness Visit  07/14/2024   DTaP/Tdap/Td vaccine (2 - Tdap) 07/22/2025   Colon Cancer Screening  10/27/2026   Hepatitis C Screening  Completed   HIV Screening  Completed   Zoster (Shingles) Vaccine  Completed   HPV Vaccine  Aged Out   Meningitis B Vaccine  Aged Out   COVID-19 Vaccine  Discontinued    Advanced directives: (Copy Requested) Please bring a copy of your health care power of attorney and living will to the office to be added to your chart at your convenience. You can mail to [facility] [address] [place], [place] [address] or email to [email or web address] Advance Care Planning is important because it:  [x]  Makes sure you receive the medical  care that is consistent with your values, goals, and preferences  [x]  It provides guidance to your family and loved ones and reduces their decisional burden about whether or not they are making the right decisions based on your wishes.  Follow the link provided in your after visit summary or read over the paperwork we have mailed to you to help you started getting your Advance Directives in place. If you need assistance in completing these, please reach out to us  so that we can help you!

## 2023-07-18 ENCOUNTER — Other Ambulatory Visit: Payer: Self-pay | Admitting: Family Medicine

## 2023-07-19 ENCOUNTER — Ambulatory Visit: Attending: Cardiology | Admitting: Cardiology

## 2023-07-19 ENCOUNTER — Encounter: Payer: Self-pay | Admitting: Cardiology

## 2023-07-19 VITALS — BP 135/85 | HR 89 | Ht 76.0 in | Wt 290.0 lb

## 2023-07-19 DIAGNOSIS — I447 Left bundle-branch block, unspecified: Secondary | ICD-10-CM | POA: Diagnosis not present

## 2023-07-19 DIAGNOSIS — I493 Ventricular premature depolarization: Secondary | ICD-10-CM

## 2023-07-19 DIAGNOSIS — I4819 Other persistent atrial fibrillation: Secondary | ICD-10-CM

## 2023-07-19 DIAGNOSIS — I428 Other cardiomyopathies: Secondary | ICD-10-CM

## 2023-07-19 DIAGNOSIS — Z79899 Other long term (current) drug therapy: Secondary | ICD-10-CM | POA: Diagnosis not present

## 2023-07-19 DIAGNOSIS — Z95810 Presence of automatic (implantable) cardiac defibrillator: Secondary | ICD-10-CM | POA: Diagnosis not present

## 2023-07-19 DIAGNOSIS — D6869 Other thrombophilia: Secondary | ICD-10-CM

## 2023-07-19 DIAGNOSIS — I5022 Chronic systolic (congestive) heart failure: Secondary | ICD-10-CM | POA: Diagnosis not present

## 2023-07-19 NOTE — Patient Instructions (Signed)
 Medication Instructions:  Your physician recommends that you continue on your current medications as directed. Please refer to the Current Medication list given to you today.  *If you need a refill on your cardiac medications before your next appointment, please call your pharmacy*  Follow-Up: At [facility], you and your health needs are our priority.  As part of our continuing mission to provide you with exceptional heart care, our providers are all part of one team.  This team includes your primary Cardiologist (physician) and Advanced Practice Providers or APPs (Physician Assistants and Nurse Practitioners) who all work together to provide you with the care you need, when you need it.  Your next appointment:   6 months  Provider:   You may see Ardeen Kohler, MD or one of the following Advanced Practice Providers on your designated Care Team:   Mertha Abrahams, [place] [address] [place], PA-C Suzann Riddle, NP Creighton Doffing, NP

## 2023-07-19 NOTE — Progress Notes (Unsigned)
 Electrophysiology Office Note:   Date:  07/20/2023  ID:  Aaron Thompson, DOB 1960/06/21, MRN [id number]  Primary Cardiologist: Maude Emmer, MD Electrophysiologist: Fonda Kitty, MD      History of Present Illness:   Aaron Thompson is a 63 y.o. male with h/o PAF s/p AF ablation, HTN, morbid obesity, OSA unable to tolerate CPAP, DM, LBBB and chronic systolic HF (EF 20-25%) due to NICM s/p STJ CRT-D who is being seen today for management of his cardiac device.  Discussed the use of AI scribe software for clinical note transcription with the patient, who gave verbal consent to proceed.  History of Present Illness Aaron Thompson is a 63 year old male with an implantable cardioverter defibrillator who presents for follow-up regarding his cardiac device and arrhythmia management.  He has an implantable cardioverter defibrillator to manage his cardiac condition and prevent hospitalizations. He feels generally well and engages in regular physical activity, including attending [employer] three times a week and maintaining his yard. He experiences fatigue when carrying heavy objects, such as a 40-pound bucket of water, but can walk a mile in 19 minutes without difficulty. He experiences premature ventricular contractions (PVCs) from the bottom chamber without symptoms.  Burden appears stable.. He has not experienced atrial fibrillation recently and continues to take Tikosyn  (dofetilide ). He has a history of ablation. Tikosyn  has become more affordable due to insurance coverage and the availability of a generic version. e takes Lasix  once a week on Fridays, which helps manage fluid retention, and spironolactone  daily. He notes increased urination on Fridays when he takes Lasix .   No swelling in the legs, shortness of breath, difficulty breathing while lying flat, or waking up gasping for breath. He sleeps with two pillows, which is his normal routine.  Review of systems complete and found to be  negative unless listed in HPI.   EP Information / Studies Reviewed:    EKG is ordered today. Personal review as below.  EKG Interpretation Date/Time:  Tuesday July 19 2023 15:26:09 EDT Ventricular Rate:  89 PR Interval:  140 QRS Duration:  132 QT Interval:  432 QTC Calculation: 525 R Axis:   119  Text Interpretation: Atrial-sensed ventricular-paced rhythm with occasional Premature ventricular complexes When compared with ECG of 03-Aug-2022 09:01, Premature ventricular complexes are now Present Vent. rate has increased BY  13 BPM Confirmed by Kitty Fonda [phone]) on 07/19/2023 3:52:17 PM   Echo 08/30/22:  1. Left ventricular ejection fraction, by estimation, is 30 to 35%. The  left ventricle has moderately decreased function. The left ventricle has  no regional wall motion abnormalities. The left ventricular internal  cavity size was mildly dilated. Left  ventricular diastolic parameters are consistent with Grade II diastolic  dysfunction (pseudonormalization).   2. Right ventricular systolic function is normal. The right ventricular  size is normal.   3. Left atrial size was mildly dilated.   4. The mitral valve is normal in structure. No evidence of mitral valve  regurgitation. No evidence of mitral stenosis.   5. The aortic valve is normal in structure. Aortic valve regurgitation is  not visualized. No aortic stenosis is present.   6. Aortic dilatation noted. There is mild dilatation of the aortic root,  measuring 40 mm.   7. The inferior vena cava is normal in size with greater than 50%  respiratory variability, suggesting right atrial pressure of 3 mmHg.       Physical Exam:   VS:  BP 135/85  Pulse 89   Ht 6' 4 (1.93 m)   Wt 290 lb (131.5 kg)   SpO2 94%   BMI 35.30 kg/m    Wt Readings from Last 3 Encounters:  07/19/23 290 lb (131.5 kg)  07/15/23 284 lb (128.8 kg)  06/03/23 284 lb 3.2 oz (128.9 kg)     GEN: Well nourished, well developed in no acute  distress NECK: No JVD CARDIAC: Normal rate, regular rhythm.  Left chest defibrillator pocket well-healed. RESPIRATORY:  Clear to auscultation without rales, wheezing or rhonchi  ABDOMEN: Soft, non-distended EXTREMITIES:  No edema; No deformity   ASSESSMENT AND PLAN:    #Status post CRT-D: - In clinic device interrogation was performed today.  Appropriate device function and stable lead parameters.  Presenting rhythm a sensed V paced.  Battery okay.  BiV pacing percentage 95%.  No therapies.  AT AF burden less than 1%. - Continue remote monitoring.  #Chronic systolic heart failure secondary to nonischemic cardiomyopathy and left bundle branch block: Appears well compensated on exam today. -Continue GDMT regimen and follow-up with primary cardiologist.  #PVCs: Wide left bundle morphology with right inferior axis, not a typical outflow tract morphology.  Burden appears to be stable somewhere less than 10%.  BiV pacing percentage today 95%.  Given appropriate BiV pacing percentage and lack of symptoms, we will not escalate therapy for PVC suppression at this time. -Continue to monitor PVC burden with CRT-D. -Continue carvedilol  50 mg twice daily. -Continue magnesium .  #Persistent atrial fibrillation: Status post ablation in 2017. #High risk medication use: Dofetilide .  QT approximately 500 when corrected for paced/wide QRS.  This is stable to prior ECGs over the past few years.  He has had no arrhythmias.  Creatinine normal within the past 6 months. -Continue dofetilide  500 mcg twice daily.  # Secondary hypercoagulable state due to AF: -Continue Xarelto  20 mg daily.  Follow up with Dr. Kennyth in 6 months  Signed, Fonda Kennyth, MD

## 2023-07-26 ENCOUNTER — Encounter: Admitting: Internal Medicine

## 2023-08-13 ENCOUNTER — Other Ambulatory Visit: Payer: Self-pay | Admitting: Internal Medicine

## 2023-08-26 ENCOUNTER — Ambulatory Visit (HOSPITAL_COMMUNITY)

## 2023-08-29 ENCOUNTER — Encounter: Payer: Self-pay | Admitting: Family Medicine

## 2023-08-29 ENCOUNTER — Ambulatory Visit (INDEPENDENT_AMBULATORY_CARE_PROVIDER_SITE_OTHER): Admitting: Family Medicine

## 2023-08-29 VITALS — BP 138/82 | HR 85 | Temp 98.4°F | Ht 76.46 in | Wt 280.4 lb

## 2023-08-29 DIAGNOSIS — Z7984 Long term (current) use of oral hypoglycemic drugs: Secondary | ICD-10-CM

## 2023-08-29 DIAGNOSIS — I4891 Unspecified atrial fibrillation: Secondary | ICD-10-CM | POA: Diagnosis not present

## 2023-08-29 DIAGNOSIS — E7800 Pure hypercholesterolemia, unspecified: Secondary | ICD-10-CM | POA: Diagnosis not present

## 2023-08-29 DIAGNOSIS — R197 Diarrhea, unspecified: Secondary | ICD-10-CM | POA: Diagnosis not present

## 2023-08-29 DIAGNOSIS — I10 Essential (primary) hypertension: Secondary | ICD-10-CM

## 2023-08-29 DIAGNOSIS — Z7189 Other specified counseling: Secondary | ICD-10-CM

## 2023-08-29 DIAGNOSIS — E119 Type 2 diabetes mellitus without complications: Secondary | ICD-10-CM

## 2023-08-29 DIAGNOSIS — F411 Generalized anxiety disorder: Secondary | ICD-10-CM

## 2023-08-29 DIAGNOSIS — Z0000 Encounter for general adult medical examination without abnormal findings: Secondary | ICD-10-CM

## 2023-08-29 DIAGNOSIS — Z125 Encounter for screening for malignant neoplasm of prostate: Secondary | ICD-10-CM | POA: Diagnosis not present

## 2023-08-29 LAB — PSA, MEDICARE: PSA: 2.79 ng/mL (ref 0.10–4.00)

## 2023-08-29 LAB — MICROALBUMIN / CREATININE URINE RATIO
Creatinine,U: 73.5 mg/dL
Microalb Creat Ratio: 115.4 mg/g — ABNORMAL HIGH (ref 0.0–30.0)
Microalb, Ur: 8.5 mg/dL — ABNORMAL HIGH (ref 0.0–1.9)

## 2023-08-29 LAB — TSH: TSH: 0.6 u[IU]/mL (ref 0.35–5.50)

## 2023-08-29 LAB — CBC WITH DIFFERENTIAL/PLATELET
Basophils Absolute: 0 K/uL (ref 0.0–0.1)
Basophils Relative: 0.6 % (ref 0.0–3.0)
Eosinophils Absolute: 0.1 K/uL (ref 0.0–0.7)
Eosinophils Relative: 2 % (ref 0.0–5.0)
HCT: 44.3 % (ref 39.0–52.0)
Hemoglobin: 15 g/dL (ref 13.0–17.0)
Lymphocytes Relative: 21.4 % (ref 12.0–46.0)
Lymphs Abs: 1.1 K/uL (ref 0.7–4.0)
MCHC: 33.8 g/dL (ref 30.0–36.0)
MCV: 87.8 fl (ref 78.0–100.0)
Monocytes Absolute: 0.7 K/uL (ref 0.1–1.0)
Monocytes Relative: 13.4 % — ABNORMAL HIGH (ref 3.0–12.0)
Neutro Abs: 3.3 K/uL (ref 1.4–7.7)
Neutrophils Relative %: 62.6 % (ref 43.0–77.0)
Platelets: 178 K/uL (ref 150.0–400.0)
RBC: 5.04 Mil/uL (ref 4.22–5.81)
RDW: 14.1 % (ref 11.5–15.5)
WBC: 5.2 K/uL (ref 4.0–10.5)

## 2023-08-29 LAB — COMPREHENSIVE METABOLIC PANEL WITH GFR
ALT: 16 U/L (ref 0–53)
AST: 14 U/L (ref 0–37)
Albumin: 4.3 g/dL (ref 3.5–5.2)
Alkaline Phosphatase: 44 U/L (ref 39–117)
BUN: 12 mg/dL (ref 6–23)
CO2: 29 meq/L (ref 19–32)
Calcium: 8.9 mg/dL (ref 8.4–10.5)
Chloride: 102 meq/L (ref 96–112)
Creatinine, Ser: 0.67 mg/dL (ref 0.40–1.50)
GFR: 100.53 mL/min (ref >60.00)
Glucose, Bld: 165 mg/dL — ABNORMAL HIGH (ref 70–99)
Potassium: 4.1 meq/L (ref 3.5–5.1)
Sodium: 141 meq/L (ref 135–145)
Total Bilirubin: 0.7 mg/dL (ref 0.2–1.2)
Total Protein: 6.3 g/dL (ref 6.0–8.3)

## 2023-08-29 LAB — LIPID PANEL
Cholesterol: 161 mg/dL (ref 0–200)
HDL: 33.5 mg/dL — ABNORMAL LOW (ref >39.00)
LDL Cholesterol: 84 mg/dL (ref 0–99)
NonHDL: 127.13
Total CHOL/HDL Ratio: 5
Triglycerides: 218 mg/dL — ABNORMAL HIGH (ref 0.0–149.0)
VLDL: 43.6 mg/dL — ABNORMAL HIGH (ref 0.0–40.0)

## 2023-08-29 LAB — HEMOGLOBIN A1C: Hgb A1c MFr Bld: 8.3 % — ABNORMAL HIGH (ref 4.6–6.5)

## 2023-08-29 MED ORDER — BUSPIRONE HCL 10 MG PO TABS: 10.0000 mg | ORAL_TABLET | Freq: Every day | ORAL | 3 refills | Status: AC

## 2023-08-29 NOTE — Patient Instructions (Signed)
 Go to the lab on the way out.   If you have mychart we'll likely use that to update you.    Take care.  Glad to see you. Recheck in about 3-4 months, A1c at the visit.

## 2023-08-29 NOTE — Progress Notes (Unsigned)
 Diabetes:  Using medications without difficulties: yes Hypoglycemic episodes:no Hyperglycemic episodes:no Feet problems:no Blood Sugars averaging: ~180 recently.   eye exam within last year: due, d/w pt.  Labs pending.   Hypertension:    Using medication without problems or lightheadedness: yes Chest pain with exertion:no Edema:no Short of breath:no  Elevated Cholesterol: Using medications without problems:yes Muscle aches: able to tolerate MWF statin dosing.   Diet compliance: yes Exercise: yes Labs pending.   AF per cards.  Anticoagulated.  No bleeding except for occ hemorrhoids. No CP.    Flu yearly Shingles prev done PNA 2013 Tetanus 2017 covid vaccine prev done.  Colon cancer screening 2018 Prostate cancer screening pending 2025 Advance directive-wife designated if patient were incapacitated.  Mood d/w pt.  Improved with 10mg  buspar  w/o ADE on med.  No SI/HI.    Recent diarrhea.  Prev fever resolved.  Some looser stools this AM but not as watery as prev.  No blood in stool. No abd pain.   PMH and SH reviewed.   Vital signs, Meds and allergies reviewed.  ROS: Per HPI unless specifically indicated in ROS section   GEN: nad, alert and oriented HEENT: ncat NECK: supple w/o LA CV: rrr PULM: ctab, no inc wob ABD: soft, +bs EXT: no edema SKIN: no acute rash  Diabetic foot exam: Normal inspection No skin breakdown No calluses  Normal DP pulses Normal sensation to light tough and monofilament Nails normal

## 2023-08-30 ENCOUNTER — Encounter: Payer: Self-pay | Admitting: Family Medicine

## 2023-08-31 ENCOUNTER — Ambulatory Visit: Payer: Self-pay | Admitting: Family Medicine

## 2023-08-31 ENCOUNTER — Other Ambulatory Visit: Payer: Self-pay | Admitting: Family Medicine

## 2023-08-31 ENCOUNTER — Ambulatory Visit (INDEPENDENT_AMBULATORY_CARE_PROVIDER_SITE_OTHER): Payer: HMO

## 2023-08-31 DIAGNOSIS — I428 Other cardiomyopathies: Secondary | ICD-10-CM | POA: Diagnosis not present

## 2023-08-31 DIAGNOSIS — R197 Diarrhea, unspecified: Secondary | ICD-10-CM | POA: Insufficient documentation

## 2023-08-31 MED ORDER — LOPERAMIDE HCL 2 MG PO TABS: 2.0000 mg | ORAL_TABLET | Freq: Four times a day (QID) | ORAL | Status: DC | PRN

## 2023-08-31 NOTE — Assessment & Plan Note (Signed)
 Benign exam.  Likely a self resolving illness.  He can update me as needed.

## 2023-08-31 NOTE — Assessment & Plan Note (Signed)
 Continue pravastatin  Monday Wednesday Friday.  See notes on labs.

## 2023-08-31 NOTE — Assessment & Plan Note (Signed)
 AF per cards.  Anticoagulated.  No bleeding except for occ hemorrhoids. No CP.   See notes on labs.

## 2023-08-31 NOTE — Assessment & Plan Note (Signed)
 Continue spironolactone  Entresto  potassium furosemide  carvedilol .  See notes on labs.

## 2023-08-31 NOTE — Assessment & Plan Note (Signed)
 Advance directive- wife designated if patient were incapacitated.

## 2023-08-31 NOTE — Assessment & Plan Note (Signed)
 Flu yearly Shingles prev done PNA 2013 Tetanus 2017 covid vaccine prev done.  Colon cancer screening 2018 Prostate cancer screening pending 2025 Advance directive-wife designated if patient were incapacitated.

## 2023-08-31 NOTE — Assessment & Plan Note (Signed)
 Recheck in about 3-4 months, A1c at the visit.  See notes on labs.  Continue glipizide  Jardiance  metformin .

## 2023-08-31 NOTE — Assessment & Plan Note (Signed)
 Mood d/w pt.  Improved with 10mg  buspar  w/o ADE on med.  No SI/HI.   Continue as is.  Update me as needed.

## 2023-09-01 ENCOUNTER — Other Ambulatory Visit: Payer: Self-pay | Admitting: Internal Medicine

## 2023-09-01 LAB — CUP PACEART REMOTE DEVICE CHECK
Battery Remaining Longevity: 31 mo
Battery Remaining Percentage: 39 %
Battery Voltage: 2.92 V
Brady Statistic AP VP Percent: 1 %
Brady Statistic AP VS Percent: 1 %
Brady Statistic AS VP Percent: 96 %
Brady Statistic AS VS Percent: 2 %
Brady Statistic RA Percent Paced: 1 %
Date Time Interrogation Session: 20250806020017
HighPow Impedance: 78 Ohm
HighPow Impedance: 78 Ohm
Implantable Lead Connection Status: 753985
Implantable Lead Connection Status: 753985
Implantable Lead Connection Status: 753985
Implantable Lead Implant Date: 20150121
Implantable Lead Implant Date: 20150121
Implantable Lead Implant Date: 20150121
Implantable Lead Location: 753858
Implantable Lead Location: 753859
Implantable Lead Location: 753860
Implantable Lead Model: 7122
Implantable Pulse Generator Implant Date: 20210512
Lead Channel Impedance Value: 430 Ohm
Lead Channel Impedance Value: 480 Ohm
Lead Channel Impedance Value: 510 Ohm
Lead Channel Pacing Threshold Amplitude: 0.875 V
Lead Channel Pacing Threshold Amplitude: 1.25 V
Lead Channel Pacing Threshold Amplitude: 1.375 V
Lead Channel Pacing Threshold Pulse Width: 0.4 ms
Lead Channel Pacing Threshold Pulse Width: 0.4 ms
Lead Channel Pacing Threshold Pulse Width: 0.5 ms
Lead Channel Sensing Intrinsic Amplitude: 12 mV
Lead Channel Sensing Intrinsic Amplitude: 3.6 mV
Lead Channel Setting Pacing Amplitude: 1.875
Lead Channel Setting Pacing Amplitude: 2.25 V
Lead Channel Setting Pacing Amplitude: 2.375
Lead Channel Setting Pacing Pulse Width: 0.4 ms
Lead Channel Setting Pacing Pulse Width: 0.5 ms
Lead Channel Setting Sensing Sensitivity: 0.5 mV
Pulse Gen Serial Number: 9890691

## 2023-09-02 ENCOUNTER — Other Ambulatory Visit: Payer: Self-pay

## 2023-09-02 ENCOUNTER — Encounter: Payer: Self-pay | Admitting: Cardiovascular Disease

## 2023-09-02 ENCOUNTER — Other Ambulatory Visit: Payer: Self-pay | Admitting: Family Medicine

## 2023-09-02 MED ORDER — CARVEDILOL 25 MG PO TABS: ORAL_TABLET | ORAL | 3 refills | Status: AC

## 2023-09-02 MED ORDER — PEN NEEDLES 30G X 5 MM MISC: 3 refills | Status: AC

## 2023-09-02 MED ORDER — LANTUS SOLOSTAR 100 UNIT/ML ~~LOC~~ SOPN: 5.0000 [IU] | PEN_INJECTOR | Freq: Every day | SUBCUTANEOUS | 99 refills | Status: AC

## 2023-09-06 ENCOUNTER — Encounter: Payer: Self-pay | Admitting: Family Medicine

## 2023-09-06 ENCOUNTER — Other Ambulatory Visit: Payer: Self-pay | Admitting: Family Medicine

## 2023-09-08 ENCOUNTER — Telehealth: Payer: Self-pay

## 2023-09-08 NOTE — Telephone Encounter (Signed)
 Copied from CRM #[id number]. Topic: Clinical - Medical Advice >> Sep 08, 2023  8:26 AM [name] wrote: Reason for CRM: Patient would like a nurse to call him back to go over how to use his new insulin  pen please.  Patient callback is [phone]

## 2023-09-08 NOTE — Telephone Encounter (Signed)
 Returned call to patient and reviewed how to use the lantus . I offered nurse visit but he believes he has it. Questions answered. Patient verbalized understanding.

## 2023-09-12 ENCOUNTER — Other Ambulatory Visit: Payer: Self-pay | Admitting: Family Medicine

## 2023-09-12 NOTE — Telephone Encounter (Signed)
 Sent. Thanks.

## 2023-09-14 ENCOUNTER — Other Ambulatory Visit: Payer: Self-pay | Admitting: Cardiovascular Disease

## 2023-09-15 NOTE — Progress Notes (Signed)
 Primary Cardiologist: Dr. Delford CHF:  Bensimohn EP:  Fernande   HPI:  Aaron Thompson is a 63 y.o. male with PAF s/p AF ablation , HTN, morbid obesity, OSA unable to tolerate CPAP, DM, LBBB and chronic systolic HF (EF 20-25%) due to NICM s/p STJ CRT-D (cath 2013 minimal CAD)     Diagnosed with systolic HF in 2013. Cath at that time minimal CAD. Had STJ CRT-D placed.   Presented to ER with new-onset AF 7/17.Had DCCV in ER.  Admitted the same month with ERAF. Felt to to have both AFib and ATach.  Subsequently seen by Dr. Kelsie and had afib ablation in 9/17. EF 40% at that time. Subsequently had recurrent AF with RVR leading to ICD shocks. Placed back on tikosyn  and anticoagulation.   Echo 12/14/16 EF 20-25% Echo 1/20 EF 25-30%  Echo 08/30/22 improved 30-35%   CRT-D gen change 5/21  Saw Dr. Fernande in 1/22 and being evaluated for symptomatic PVCs. On ICD interrogation in 2/22 burden was 1.3%.  He saw Dr. Fernande in 6/24 and device interrogation showed 6-7% PVCs. No recent AF/AT.   Here for f/u. Playing golf. Working in yard. Goes to [employer] 3x/week - workouts and walks 1 mile  No significant SOB, edema, orthopnea or PND. Occasional episodes of chest tenderness. Will take prn diltiazem  for symptomatic PVCs. Only takes rarely.   ICD interrogation 09/01/23 No Vt/VF.  AF burden < 1% PVC 2.5 % BiV pacing 95% Volume status dry. Personally reviewed  Active doing yard work  [employer] wedding is at the end of this month He is in [place] working for [employer]   Past Medical History:  Diagnosis Date   AICD (automatic cardioverter/defibrillator) present    Allergy 1968   Anxiety state, unspecified    Calculus of kidney 1981   passed it on my own   Cataract 2020   Chronic systolic CHF (congestive heart failure) (HCC)    Dermatophytosis of the body    DJD (degenerative joint disease)    Esophageal reflux    Hx of colonic polyps    Hypertension    LBBB (left bundle branch block)     intermittent   Non-ischemic cardiomyopathy (HCC)    a. cath 2013: minor nonobstructive CAD.   OSA (obstructive sleep apnea)    he can't tolerate CPAP.    Osteoarthrosis, unspecified whether generalized or localized, hand    Other chronic nonalcoholic liver disease    fatty liver   PAF (paroxysmal atrial fibrillation) (HCC)    a. s/p DCCV 6/11; previously on Pradaxa;  b. Event Monitor 2012->No PAF;  c. 09/2011 s/p DCCV ->Xarelto  started. d. 03/2014: inappropriate ICD shocks for AF-RVR, started on Tikosyn , Xarelto  restarted.   Type II diabetes mellitus (HCC)    Unspecified hearing loss    no hearing aid    Current Outpatient Medications  Medication Sig Dispense Refill   acetaminophen  (TYLENOL ) 325 MG tablet Take 650 mg by mouth every 6 (six) hours as needed for mild pain or moderate pain.     Blood Glucose Monitoring Suppl (ONE TOUCH ULTRA 2) w/Device KIT Use daily to check sugars as needed. Dx E11.9 1 kit 0   busPIRone  (BUSPAR ) 10 MG tablet Take 1 tablet (10 mg total) by mouth daily. 90 tablet 3   carvedilol  (COREG ) 25 MG tablet TAKE 2 TABLETS(50 MG) BY MOUTH TWICE DAILY WITH A MEAL 360 tablet 3   diltiazem  (CARDIZEM ) 30 MG tablet TAKE 1  TABLET BY MOUTH AS NEEDED, MAY REPEAT DOSE IN 4 HOURS ONCE IF NEEDED 90 tablet 0   dofetilide  (TIKOSYN ) 500 MCG capsule TAKE 1 CAPSULE(500 MCG) BY MOUTH TWICE DAILY 180 capsule 1   furosemide  (LASIX ) 40 MG tablet TAKE 1 TABLET BY MOUTH EVERY FRIDAY PLEASE SCHEDULE APPOINTMENT FOR MORE REFILLS 12 tablet 1   glipiZIDE  (GLUCOTROL  XL) 10 MG 24 hr tablet TAKE 1 TABLET(10 MG) BY MOUTH DAILY WITH BREAKFAST 30 tablet 4   HYDROcodone  bit-homatropine (HYCODAN) 5-1.5 MG/5ML syrup Take 5 mLs by mouth every 8 (eight) hours as needed for cough (sedation caution, don't take with tramadol .). 75 mL 0   insulin  glargine (LANTUS  SOLOSTAR) 100 UNIT/ML Solostar Pen Inject 5-20 Units into the skin daily. Start with 5 units daily.  Add 1 unit daily if AM sugar is above 150.  If  AM sugar 100-150, no change in dose.  If AM sugar below 100, then cut back 1 unit per day. 15 mL PRN   Insulin  Pen Needle (PEN NEEDLES) 30G X 5 MM MISC Use with insulin  pen 100 each 3   JARDIANCE  25 MG TABS tablet TAKE 1 TABLET BY MOUTH EVERY DAY 90 tablet 1   loperamide  (IMODIUM  A-D) 2 MG tablet Take 1 tablet (2 mg total) by mouth 4 (four) times daily as needed for diarrhea or loose stools.     loratadine (CLARITIN) 10 MG tablet Take 10 mg by mouth daily as needed for allergies.     Magnesium  Oxide 400 MG CAPS Take 1 capsule (400 mg total) by mouth daily. 90 capsule 3   metFORMIN  (GLUCOPHAGE -XR) 500 MG 24 hr tablet TAKE 3 TABLETS(1500 MG) BY MOUTH DAILY WITH BREAKFAST 270 tablet 1   Multiple Vitamin (MULTIVITAMIN) tablet Take 1 tablet by mouth daily.     omeprazole  (PRILOSEC) 40 MG capsule TAKE 1 CAPSULE(40 MG) BY MOUTH DAILY 90 capsule 0   ONETOUCH ULTRA TEST test strip USE TO CHECK GLUCOSE TWICE DAILY AND AS NEEDED 100 strip 1   potassium chloride  SA (KLOR-CON  M) 20 MEQ tablet TAKE 2 TABLETS BY MOUTH TWICE DAILY 180 tablet 3   pravastatin  (PRAVACHOL ) 10 MG tablet Take 1 tablet (10 mg total) by mouth every Monday, Wednesday, and Friday.     sacubitril -valsartan  (ENTRESTO ) 97-103 MG TAKE 1 TABLET BY MOUTH TWICE DAILY 180 tablet 3   spironolactone  (ALDACTONE ) 25 MG tablet TAKE 1 TABLET(25 MG) BY MOUTH DAILY 90 tablet 3   traMADol  (ULTRAM ) 50 MG tablet TAKE 1 TABLET(50 MG) BY MOUTH EVERY 12 HOURS AS NEEDED FOR PAIN 60 tablet 2   triamcinolone  cream (KENALOG ) 0.1 % Apply 1 Application topically 2 (two) times daily as needed. 45 g 2   XARELTO  20 MG TABS tablet TAKE 1 TABLET(20 MG) BY MOUTH DAILY WITH SUPPER 30 tablet 5   No current facility-administered medications for this visit.    Allergies  Allergen Reactions   Atorvastatin      Muscle pain       Social History   Socioeconomic History   Marital status: Married    Spouse name: Not on file   Number of children: 2   Years of education:  Not on file   Highest education level: 12th grade  Occupational History   Occupation: Chief Strategy Officer: UNEMPLOYED  Tobacco Use   Smoking status: Never    Passive exposure: Past   Smokeless tobacco: Former    Types: Snuff    Quit date: 10/12/2004   Tobacco comments:  02/14/2013 quit snuff in 2006  Vaping Use   Vaping status: Never Used  Substance and Sexual Activity   Alcohol use: No   Drug use: No   Sexual activity: Yes    Birth control/protection: None  Other Topics Concern   Not on file  Social History Narrative   Lives in [place] with wife.  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   Married 1990   Social Drivers of Corporate investment banker Strain: Low Risk  (08/27/2023)   Overall Financial Resource Strain (CARDIA)    Difficulty of Paying Living Expenses: Not hard at all  Food Insecurity: No Food Insecurity (08/27/2023)   Hunger Vital Sign    Worried About Running Out of Food in the Last Year: Never true    Ran Out of Food in the Last Year: Never true  Transportation Needs: No Transportation Needs (08/27/2023)   PRAPARE - Administrator, Civil Service (Medical): No    Lack of Transportation (Non-Medical): No  Physical Activity: Insufficiently Active (08/27/2023)   Exercise Vital Sign    Days of Exercise per Week: 3 days    Minutes of Exercise per Session: 20 min  Stress: No Stress Concern Present (08/27/2023)   [employer] of Occupational Health - Occupational Stress Questionnaire    Feeling of Stress: Not at all  Social Connections: Moderately Integrated (08/27/2023)   Social Connection and Isolation Panel    Frequency of Communication with Friends and Family: More than three times a week    Frequency of Social Gatherings with Friends and Family: More than three times a week    Attends Religious Services: More than 4 times per year    Active Member of [employer] or Organizations: No    Attends Engineer, structural: Not on file     Marital Status: Married  Catering manager Violence: Not At Risk (07/15/2023)   Humiliation, Afraid, Rape, and Kick questionnaire    Fear of Current or Ex-Partner: No    Emotionally Abused: No    Physically Abused: No    Sexually Abused: No      Family History  Problem Relation Age of Onset   Hypertension Mother    Diabetes Mother    Heart failure Mother        Died @ 73   Arthritis Mother    Heart disease Mother    Hypertension Father    Heart failure Father        Died @ 32   Prostate cancer Father    Arthritis Father    Hearing loss Father    Heart disease Father    Other Sister    Arthritis Sister    Multiple myeloma Sister    Leukemia Sister    Arthritis Sister    Cancer Sister    Arthritis Sister    Colon polyps Sister    Arthritis Sister    Diabetes Brother    Kidney disease Brother    Arthritis Brother    Diabetes Brother    Prostate cancer Brother    Arthritis Brother    Diabetes Brother    Arthritis Brother    Cirrhosis Maternal Uncle        alcohol related   Stroke Paternal Grandmother    Colon cancer Paternal Grandmother    Heart attack Paternal Grandfather    Esophageal cancer Neg Hx    Stomach cancer Neg Hx    Pancreatic cancer Neg Hx  Vitals:   09/29/23 0748  BP: 100/70  Pulse: 81  SpO2: 95%  Weight: 280 lb (127 kg)  Height: 6' 4 (1.93 m)       PHYSICAL EXAM:  Affect appropriate Healthy:  appears stated age HEENT: normal Neck supple with no adenopathy JVP normal no bruits no thyromegaly Lungs clear with no wheezing and good diaphragmatic motion Heart:  S1/S2 no murmur, no rub, gallop or click PMI enlarged AICD under left clavicle  Abdomen: benighn, BS positve, no tenderness, no AAA no bruit.  No HSM or HJR Distal pulses intact with no bruits No edema Neuro non-focal Skin warm and dry No muscular weakness     ECG: NSR with v-pacing 76 QTS 513 ms (QRS is 132 so JT is 383)Personally reviewed  ASSESSMENT & PLAN:  1.  Chronic systolic failure due to NICM s/p STJ CRT-D - cath 2013 non-obstructive CAD - Echo 08/30/22 EF 30-35% no significant valve dx  - Doing well NYHA I - Continue lasix  40 + kcl 20 on every Friday - ICD interrogated as above - On goal-dose HF meds: Entresto  97/103 bid, carvedilol  25 bid, spiro 25 daily. + Jardiance  25mg  daily   2. PAF - s/p AF ablation - maintaining NSR on Tikosyn . Continue Xarelto  for [facility] - Followed by Dr. Fernande - QT mildly prolonged but JT ok (ok no change from previous) - No bleeding on Xarelto    3. OSA - unable to tolerate CPAP - weight loss recommended.  - Refer for GLP1RA  4. DM2  - Continue Jardiance  25 - continue statin and Entresto  - Consider GLP1RA  5. PVCs - ICD interrogation with 1.3% burden 2/22 - In 6/24 PVC burden 6-7% on ICD interrogation with Dr. Fernande - Doubt PVC myopathy as EF remained low in the setting of 1-2% PVC burden.  - PVC burden 2.5% 08/03/22    Maude Emmer, MD  7:55 AM    .

## 2023-09-29 ENCOUNTER — Other Ambulatory Visit: Payer: Self-pay | Admitting: Family Medicine

## 2023-09-29 ENCOUNTER — Ambulatory Visit: Attending: Cardiovascular Disease | Admitting: Cardiovascular Disease

## 2023-09-29 VITALS — BP 100/70 | HR 81 | Ht 76.0 in | Wt 280.0 lb

## 2023-09-29 DIAGNOSIS — I493 Ventricular premature depolarization: Secondary | ICD-10-CM

## 2023-09-29 DIAGNOSIS — I4891 Unspecified atrial fibrillation: Secondary | ICD-10-CM

## 2023-09-29 DIAGNOSIS — I428 Other cardiomyopathies: Secondary | ICD-10-CM | POA: Diagnosis not present

## 2023-09-29 DIAGNOSIS — Z95810 Presence of automatic (implantable) cardiac defibrillator: Secondary | ICD-10-CM

## 2023-09-29 NOTE — Patient Instructions (Signed)
 Medication Instructions:  Your physician recommends that you continue on your current medications as directed. Please refer to the Current Medication list given to you today.  *If you need a refill on your cardiac medications before your next appointment, please call your pharmacy*  Lab Work: If you have labs (blood work) drawn today and your tests are completely normal, you will receive your results only by: MyChart Message (if you have MyChart) OR A paper copy in the mail If you have any lab test that is abnormal or we need to change your treatment, we will call you to review the results.  Testing/Procedures: None ordered today.  Follow-Up: At [facility], you and your health needs are our priority.  As part of our continuing mission to provide you with exceptional heart care, our providers are all part of one team.  This team includes your primary Cardiologist (physician) and Advanced Practice Providers or APPs (Physician Assistants and Nurse Practitioners) who all work together to provide you with the care you need, when you need it.  Your next appointment:   6 month(s)  Provider:   Janelle Mediate, MD    We recommend signing up for the patient portal called "MyChart".  Sign up information is provided on this After Visit Summary.  MyChart is used to connect with patients for Virtual Visits (Telemedicine).  Patients are able to view lab/test results, encounter notes, upcoming appointments, etc.  Non-urgent messages can be sent to your provider as well.   To learn more about what you can do with MyChart, go to [email or web address].

## 2023-10-09 ENCOUNTER — Other Ambulatory Visit: Payer: Self-pay | Admitting: Family Medicine

## 2023-10-09 ENCOUNTER — Other Ambulatory Visit: Payer: Self-pay | Admitting: Internal Medicine

## 2023-10-15 ENCOUNTER — Other Ambulatory Visit: Payer: Self-pay | Admitting: Cardiovascular Disease

## 2023-10-24 NOTE — Progress Notes (Signed)
 Remote ICD Transmission

## 2023-10-27 ENCOUNTER — Encounter: Payer: Self-pay | Admitting: Family Medicine

## 2023-10-28 ENCOUNTER — Other Ambulatory Visit: Payer: Self-pay

## 2023-10-28 MED ORDER — PRAVASTATIN SODIUM 10 MG PO TABS: 10.0000 mg | ORAL_TABLET | ORAL | 2 refills | Status: DC

## 2023-11-03 ENCOUNTER — Other Ambulatory Visit: Payer: Self-pay

## 2023-11-03 MED ORDER — PRAVASTATIN SODIUM 10 MG PO TABS: 10.0000 mg | ORAL_TABLET | ORAL | 2 refills | Status: AC

## 2023-11-14 ENCOUNTER — Other Ambulatory Visit: Payer: Self-pay | Admitting: Family Medicine

## 2023-11-14 DIAGNOSIS — E119 Type 2 diabetes mellitus without complications: Secondary | ICD-10-CM

## 2023-11-30 ENCOUNTER — Ambulatory Visit (INDEPENDENT_AMBULATORY_CARE_PROVIDER_SITE_OTHER)

## 2023-11-30 DIAGNOSIS — I428 Other cardiomyopathies: Secondary | ICD-10-CM | POA: Diagnosis not present

## 2023-12-01 LAB — CUP PACEART REMOTE DEVICE CHECK
Battery Remaining Longevity: 29 mo
Battery Remaining Percentage: 35 %
Battery Voltage: 2.92 V
Brady Statistic AP VP Percent: 1 %
Brady Statistic AP VS Percent: 1 %
Brady Statistic AS VP Percent: 96 %
Brady Statistic AS VS Percent: 1.8 %
Brady Statistic RA Percent Paced: 1 %
Date Time Interrogation Session: 20251105020016
HighPow Impedance: 79 Ohm
HighPow Impedance: 79 Ohm
Implantable Lead Connection Status: 753985
Implantable Lead Connection Status: 753985
Implantable Lead Connection Status: 753985
Implantable Lead Implant Date: 20150121
Implantable Lead Implant Date: 20150121
Implantable Lead Implant Date: 20150121
Implantable Lead Location: 753858
Implantable Lead Location: 753859
Implantable Lead Location: 753860
Implantable Lead Model: 7122
Implantable Pulse Generator Implant Date: 20210512
Lead Channel Impedance Value: 440 Ohm
Lead Channel Impedance Value: 480 Ohm
Lead Channel Impedance Value: 590 Ohm
Lead Channel Pacing Threshold Amplitude: 0.875 V
Lead Channel Pacing Threshold Amplitude: 1.25 V
Lead Channel Pacing Threshold Amplitude: 1.625 V
Lead Channel Pacing Threshold Pulse Width: 0.4 ms
Lead Channel Pacing Threshold Pulse Width: 0.4 ms
Lead Channel Pacing Threshold Pulse Width: 0.5 ms
Lead Channel Sensing Intrinsic Amplitude: 12 mV
Lead Channel Sensing Intrinsic Amplitude: 4.6 mV
Lead Channel Setting Pacing Amplitude: 1.875
Lead Channel Setting Pacing Amplitude: 2.25 V
Lead Channel Setting Pacing Amplitude: 2.625
Lead Channel Setting Pacing Pulse Width: 0.4 ms
Lead Channel Setting Pacing Pulse Width: 0.5 ms
Lead Channel Setting Sensing Sensitivity: 0.5 mV
Pulse Gen Serial Number: 9890691

## 2023-12-01 NOTE — Progress Notes (Signed)
 Remote ICD Transmission

## 2023-12-02 ENCOUNTER — Ambulatory Visit: Payer: Self-pay | Admitting: Cardiology

## 2023-12-02 ENCOUNTER — Encounter: Payer: Self-pay | Admitting: Cardiovascular Disease

## 2023-12-05 ENCOUNTER — Encounter: Payer: Self-pay | Admitting: Family Medicine

## 2023-12-05 ENCOUNTER — Ambulatory Visit: Admitting: Family Medicine

## 2023-12-05 VITALS — BP 118/72 | HR 77 | Temp 98.2°F | Ht 76.0 in | Wt 287.0 lb

## 2023-12-05 DIAGNOSIS — E119 Type 2 diabetes mellitus without complications: Secondary | ICD-10-CM

## 2023-12-05 DIAGNOSIS — Z23 Encounter for immunization: Secondary | ICD-10-CM | POA: Diagnosis not present

## 2023-12-05 DIAGNOSIS — Z7984 Long term (current) use of oral hypoglycemic drugs: Secondary | ICD-10-CM | POA: Diagnosis not present

## 2023-12-05 LAB — POCT GLYCOSYLATED HEMOGLOBIN (HGB A1C): Hemoglobin A1C: 7.9 % — AB (ref 4.0–5.6)

## 2023-12-05 NOTE — Progress Notes (Unsigned)
 Diabetes:  Using medications without difficulties: he had some occ local skin irritation with insulin  shot but not diffuse rash.  No sx now but I asked him to update me as needed.    Hypoglycemic episodes: no Hyperglycemic episodes: no Feet problems: no Blood Sugars averaging: ~120-200 recently.   eye exam within last year: due, d/w pt.   A1c 7.9. his numbers had improved at home.   Metformin  1500mg , jardiance  25, 18 units insulin  per day.  He had to gradually inc his insulin  based on his readings.  Still on glipizide  10mg  per day.   He noted inc in appetite with insulin  start.   He is still working on diet and exercise.    His brother has leukemia, with a port infection.  D/w pt.  We reviewed his CBC which was unremarkable.    Meds, vitals, and allergies reviewed.   ROS: Per HPI unless specifically indicated in ROS section   GEN: nad, alert and oriented HEENT: ncat NECK: supple w/o LA CV: rrr. PULM: ctab, no inc wob ABD: soft, +bs EXT: no edema SKIN: well perfused.

## 2023-12-05 NOTE — Patient Instructions (Addendum)
 Call about an eye exam when possible.   Recheck in about 3-4 months, A1c at the visit.  Update me as needed.  Take care.  Glad to see you.

## 2023-12-07 NOTE — Assessment & Plan Note (Signed)
 A1c 7.9. his numbers had improved at home.   Metformin  1500mg , jardiance  25, 18 units insulin  per day.  He had to gradually inc his insulin  based on his readings.  Still on glipizide  10mg  per day.   He noted inc in appetite with insulin  start.   He is still working on diet and exercise.    Recheck in about 3-4 months, A1c at the visit.  Consider stopping glipizide  at next office visit. He can still gradually increase his insulin  1 unit/day if his sugar is above 150 in the mornings.

## 2023-12-15 ENCOUNTER — Other Ambulatory Visit: Payer: Self-pay | Admitting: Cardiovascular Disease

## 2023-12-15 DIAGNOSIS — I4891 Unspecified atrial fibrillation: Secondary | ICD-10-CM

## 2023-12-16 NOTE — Telephone Encounter (Signed)
 Xarelto  20mg  refill request received. Pt is 63 years old, weight-130.2kg, Crea-0.67 on 08/29/23, last seen by Dr. Delford on 09/29/23, Diagnosis-Afib, CrCl-52.64 mL/min; Dose is appropriate based on dosing criteria. Will send in refill to requested pharmacy.

## 2023-12-17 ENCOUNTER — Other Ambulatory Visit: Payer: Self-pay | Admitting: Family Medicine

## 2023-12-17 DIAGNOSIS — M545 Low back pain, unspecified: Secondary | ICD-10-CM

## 2023-12-17 DIAGNOSIS — E119 Type 2 diabetes mellitus without complications: Secondary | ICD-10-CM

## 2023-12-19 ENCOUNTER — Encounter: Payer: Self-pay | Admitting: Family Medicine

## 2023-12-19 NOTE — Telephone Encounter (Signed)
 Name of Medication:  Tramadol  Name of Pharmacy:  [facility] [address] Last Kandra or Written Date and Quantity:  11/16/23, #60 Last Office Visit and Type:  12/05/23, 3 mo f/u Next Office Visit and Type:  none Last Controlled Substance Agreement Date:  none Last UDS:  none

## 2023-12-19 NOTE — Telephone Encounter (Signed)
Refill request pending

## 2023-12-26 ENCOUNTER — Encounter: Payer: Self-pay | Admitting: Cardiovascular Disease

## 2023-12-27 ENCOUNTER — Other Ambulatory Visit: Payer: Self-pay | Admitting: Family Medicine

## 2024-01-03 ENCOUNTER — Other Ambulatory Visit: Payer: Self-pay | Admitting: Family Medicine

## 2024-01-03 DIAGNOSIS — E119 Type 2 diabetes mellitus without complications: Secondary | ICD-10-CM

## 2024-01-03 NOTE — Telephone Encounter (Signed)
 E-scribed refill.  Pls schedule 3- 4 mo DM f/u, after 03/07/23, for additional refills.

## 2024-01-17 ENCOUNTER — Other Ambulatory Visit: Payer: Self-pay | Admitting: Family Medicine

## 2024-01-24 ENCOUNTER — Encounter: Payer: Self-pay | Admitting: Family Medicine

## 2024-01-24 NOTE — Telephone Encounter (Signed)
 Chart shows Jardiance  rx sent 01/23/24, #90/1 refill to [facility].

## 2024-01-27 NOTE — Telephone Encounter (Signed)
 Called pharmacy they did not have script on file. I have given verbal. They will have ready in the next hour or so. I have called and let patient know. He will reach ou tif any questions.

## 2024-01-29 ENCOUNTER — Other Ambulatory Visit: Payer: Self-pay | Admitting: Internal Medicine

## 2024-02-06 ENCOUNTER — Other Ambulatory Visit: Payer: Self-pay

## 2024-02-06 ENCOUNTER — Encounter: Payer: Self-pay | Admitting: Family Medicine

## 2024-02-06 DIAGNOSIS — E119 Type 2 diabetes mellitus without complications: Secondary | ICD-10-CM

## 2024-02-06 MED ORDER — ONETOUCH ULTRA TEST VI STRP: ORAL_STRIP | 1 refills | Status: DC

## 2024-02-09 ENCOUNTER — Other Ambulatory Visit: Payer: Self-pay

## 2024-02-09 ENCOUNTER — Other Ambulatory Visit (HOSPITAL_COMMUNITY): Payer: Self-pay

## 2024-02-09 ENCOUNTER — Telehealth: Payer: Self-pay

## 2024-02-09 MED ORDER — ONETOUCH ULTRA TEST VI STRP: ORAL_STRIP | 1 refills | Status: AC

## 2024-02-09 NOTE — Telephone Encounter (Signed)
 It appears so.  Please notify patient and send the prescription for Contour testing supplies.  Thanks.

## 2024-02-09 NOTE — Telephone Encounter (Signed)
 Prior Authorization form/request asks a question that requires your assistance. Please see the question below and advise accordingly. The PA will not be submitted until the necessary information is received.

## 2024-02-09 NOTE — Telephone Encounter (Signed)
 Do we need to just send new script in for listed options?

## 2024-02-10 MED ORDER — CONTOUR BLOOD GLUCOSE SYSTEM W/DEVICE KIT: 1.0000 | PACK | 0 refills | Status: AC

## 2024-02-10 NOTE — Addendum Note (Signed)
 Addended by: DELORES NO L on: 02/10/2024 04:56 PM   Modules accepted: Orders

## 2024-02-10 NOTE — Telephone Encounter (Signed)
 Notified pt Rx was sent to Pharmacy

## 2024-02-24 ENCOUNTER — Encounter (HOSPITAL_COMMUNITY): Payer: Self-pay | Admitting: Internal Medicine

## 2024-02-24 ENCOUNTER — Ambulatory Visit (HOSPITAL_COMMUNITY)
Admission: RE | Admit: 2024-02-24 | Discharge: 2024-02-24 | Disposition: A | Source: Ambulatory Visit | Attending: Internal Medicine | Admitting: Internal Medicine

## 2024-02-24 VITALS — BP 102/70 | HR 83 | Wt 289.8 lb

## 2024-02-24 DIAGNOSIS — I428 Other cardiomyopathies: Secondary | ICD-10-CM | POA: Insufficient documentation

## 2024-02-24 DIAGNOSIS — I447 Left bundle-branch block, unspecified: Secondary | ICD-10-CM | POA: Insufficient documentation

## 2024-02-24 DIAGNOSIS — I480 Paroxysmal atrial fibrillation: Secondary | ICD-10-CM | POA: Insufficient documentation

## 2024-02-24 DIAGNOSIS — Z7901 Long term (current) use of anticoagulants: Secondary | ICD-10-CM | POA: Insufficient documentation

## 2024-02-24 DIAGNOSIS — Z79899 Other long term (current) drug therapy: Secondary | ICD-10-CM | POA: Diagnosis not present

## 2024-02-24 DIAGNOSIS — Z87891 Personal history of nicotine dependence: Secondary | ICD-10-CM | POA: Insufficient documentation

## 2024-02-24 DIAGNOSIS — E119 Type 2 diabetes mellitus without complications: Secondary | ICD-10-CM | POA: Diagnosis not present

## 2024-02-24 DIAGNOSIS — I110 Hypertensive heart disease with heart failure: Secondary | ICD-10-CM | POA: Insufficient documentation

## 2024-02-24 DIAGNOSIS — I5022 Chronic systolic (congestive) heart failure: Secondary | ICD-10-CM | POA: Diagnosis present

## 2024-02-24 DIAGNOSIS — Z95810 Presence of automatic (implantable) cardiac defibrillator: Secondary | ICD-10-CM | POA: Diagnosis not present

## 2024-02-24 DIAGNOSIS — Z7984 Long term (current) use of oral hypoglycemic drugs: Secondary | ICD-10-CM | POA: Diagnosis not present

## 2024-02-24 DIAGNOSIS — G4733 Obstructive sleep apnea (adult) (pediatric): Secondary | ICD-10-CM | POA: Diagnosis not present

## 2024-02-24 DIAGNOSIS — I493 Ventricular premature depolarization: Secondary | ICD-10-CM | POA: Diagnosis not present

## 2024-02-24 DIAGNOSIS — Z794 Long term (current) use of insulin: Secondary | ICD-10-CM | POA: Diagnosis not present

## 2024-02-24 MED ORDER — FUROSEMIDE 40 MG PO TABS: 40.0000 mg | ORAL_TABLET | ORAL | Status: AC

## 2024-02-24 NOTE — Patient Instructions (Signed)
 Medication Changes:  INCREASE Furosemide  to 40 mg every Monday and Friday   Testing/Procedures:  Your physician has requested that you have an echocardiogram. Echocardiography is a painless test that uses sound waves to create images of your heart. It provides your doctor with information about the size and shape of your heart and how well your hearts chambers and valves are working. This procedure takes approximately one hour. There are no restrictions for this procedure. Please do NOT wear cologne, perfume, aftershave, or lotions (deodorant is allowed). Please arrive 15 minutes prior to your appointment time. In 6 months  Please note: We ask at that you not bring children with you during ultrasound (echo/ vascular) testing. Due to room size and safety concerns, children are not allowed in the ultrasound rooms during exams. Our front office staff cannot provide observation of children in our lobby area while testing is being conducted. An adult accompanying a patient to their appointment will only be allowed in the ultrasound room at the discretion of the ultrasound technician under special circumstances. We apologize for any inconvenience.  Special Instructions // Education:  Do the following things EVERYDAY: Weigh yourself in the morning before breakfast. Write it down and keep it in a log. Take your medicines as prescribed Eat low salt foods--Limit salt (sodium) to 2000 mg per day.  Stay as active as you can everyday Limit all fluids for the day to less than 2 liters   Follow-Up in: 6 months with an echocardiogram (July/Aug), **PLEASE CALL OUR OFFICE IN MAY TO SCHEDULE THIS APPOINTMENT   At [facility], you and your health needs are our priority. We have a designated team specialized in the treatment of Heart Failure. This Care Team includes your primary Heart Failure Specialized Cardiologist (physician), Advanced Practice Providers (APPs- Physician Assistants  and Nurse Practitioners), and Pharmacist who all work together to provide you with the care you need, when you need it.   You may see any of the following providers on your designated Care Team at your next follow up:  Dr. Toribio Fuel Dr. Ezra Shuck Dr. Odis Brownie Greig Mosses, NP Caffie Shed, [place] [facility], [place] Beckey Coe, NP Jordan Lee, NP Tinnie Redman, PharmD   Please be sure to bring in all your medications bottles to every appointment.   Need to Contact Us :  If you have any questions or concerns before your next appointment please send us  a message through Branch or call our office at [phone].    TO LEAVE A MESSAGE FOR THE NURSE SELECT OPTION 2, PLEASE LEAVE A MESSAGE INCLUDING: YOUR NAME DATE OF BIRTH CALL BACK NUMBER REASON FOR CALL**this is important as we prioritize the call backs  YOU WILL RECEIVE A CALL BACK THE SAME DAY AS LONG AS YOU CALL BEFORE 4:00 PM

## 2024-02-29 ENCOUNTER — Ambulatory Visit

## 2024-03-01 LAB — CUP PACEART REMOTE DEVICE CHECK
Battery Remaining Longevity: 26 mo
Battery Remaining Percentage: 32 %
Battery Voltage: 2.92 V
Brady Statistic AP VP Percent: 1 %
Brady Statistic AP VS Percent: 1 %
Brady Statistic AS VP Percent: 95 %
Brady Statistic AS VS Percent: 1.8 %
Brady Statistic RA Percent Paced: 1 %
Date Time Interrogation Session: 20260204045647
HighPow Impedance: 81 Ohm
HighPow Impedance: 81 Ohm
Implantable Lead Connection Status: 753985
Implantable Lead Connection Status: 753985
Implantable Lead Connection Status: 753985
Implantable Lead Implant Date: 20150121
Implantable Lead Implant Date: 20150121
Implantable Lead Implant Date: 20150121
Implantable Lead Location: 753858
Implantable Lead Location: 753859
Implantable Lead Location: 753860
Implantable Lead Model: 7122
Implantable Pulse Generator Implant Date: 20210512
Lead Channel Impedance Value: 450 Ohm
Lead Channel Impedance Value: 450 Ohm
Lead Channel Impedance Value: 540 Ohm
Lead Channel Pacing Threshold Amplitude: 0.875 V
Lead Channel Pacing Threshold Amplitude: 1.375 V
Lead Channel Pacing Threshold Amplitude: 1.5 V
Lead Channel Pacing Threshold Pulse Width: 0.4 ms
Lead Channel Pacing Threshold Pulse Width: 0.4 ms
Lead Channel Pacing Threshold Pulse Width: 0.5 ms
Lead Channel Sensing Intrinsic Amplitude: 11.7 mV
Lead Channel Sensing Intrinsic Amplitude: 5 mV
Lead Channel Setting Pacing Amplitude: 1.875
Lead Channel Setting Pacing Amplitude: 2.375
Lead Channel Setting Pacing Amplitude: 2.5 V
Lead Channel Setting Pacing Pulse Width: 0.4 ms
Lead Channel Setting Pacing Pulse Width: 0.5 ms
Lead Channel Setting Sensing Sensitivity: 0.5 mV
Pulse Gen Serial Number: 9890691

## 2024-03-08 ENCOUNTER — Ambulatory Visit: Admitting: Family Medicine

## 2024-05-30 ENCOUNTER — Encounter

## 2024-07-18 ENCOUNTER — Ambulatory Visit

## 2024-08-29 ENCOUNTER — Encounter

## 2024-11-28 ENCOUNTER — Encounter
